# Patient Record
Sex: Male | Born: 1940 | Race: White | Hispanic: No | Marital: Married | Smoking: Former smoker
Health system: Southern US, Community
[De-identification: ages and names within clinical notes are randomized; demographics above are authoritative.]

## PROBLEM LIST (undated history)

## (undated) DIAGNOSIS — I4891 Unspecified atrial fibrillation: Secondary | ICD-10-CM

## (undated) DIAGNOSIS — E119 Type 2 diabetes mellitus without complications: Secondary | ICD-10-CM

## (undated) DIAGNOSIS — I2510 Atherosclerotic heart disease of native coronary artery without angina pectoris: Secondary | ICD-10-CM

## (undated) DIAGNOSIS — J449 Chronic obstructive pulmonary disease, unspecified: Secondary | ICD-10-CM

## (undated) DIAGNOSIS — IMO0002 Reserved for concepts with insufficient information to code with codable children: Secondary | ICD-10-CM

## (undated) DIAGNOSIS — N189 Chronic kidney disease, unspecified: Secondary | ICD-10-CM

## (undated) DIAGNOSIS — Z9989 Dependence on other enabling machines and devices: Principal | ICD-10-CM

## (undated) DIAGNOSIS — I509 Heart failure, unspecified: Secondary | ICD-10-CM

## (undated) DIAGNOSIS — C4491 Basal cell carcinoma of skin, unspecified: Secondary | ICD-10-CM

## (undated) DIAGNOSIS — I10 Essential (primary) hypertension: Secondary | ICD-10-CM

## (undated) DIAGNOSIS — G4733 Obstructive sleep apnea (adult) (pediatric): Secondary | ICD-10-CM

## (undated) HISTORY — DX: Heart failure, unspecified: I50.9

## (undated) HISTORY — DX: Type 2 diabetes mellitus without complications: E11.9

## (undated) HISTORY — DX: Obstructive sleep apnea (adult) (pediatric): G47.33

## (undated) HISTORY — PX: HERNIA REPAIR: SHX51

## (undated) HISTORY — DX: Atherosclerotic heart disease of native coronary artery without angina pectoris: I25.10

## (undated) HISTORY — DX: Reserved for concepts with insufficient information to code with codable children: IMO0002

## (undated) HISTORY — DX: Unspecified atrial fibrillation: I48.91

## (undated) HISTORY — PX: OTHER SURGICAL HISTORY: SHX169

## (undated) HISTORY — DX: Basal cell carcinoma of skin, unspecified: C44.91

## (undated) HISTORY — DX: Essential (primary) hypertension: I10

## (undated) HISTORY — DX: Chronic obstructive pulmonary disease, unspecified: J44.9

## (undated) HISTORY — PX: CLAVICLE SURGERY: SHX598

## (undated) HISTORY — DX: Dependence on other enabling machines and devices: Z99.89

---

## 2008-01-24 ENCOUNTER — Ambulatory Visit: Payer: Self-pay | Admitting: Gastroenterology

## 2008-07-27 ENCOUNTER — Inpatient Hospital Stay: Payer: Self-pay | Admitting: Internal Medicine

## 2012-01-19 ENCOUNTER — Ambulatory Visit: Payer: Self-pay | Admitting: Gastroenterology

## 2012-03-13 ENCOUNTER — Ambulatory Visit: Payer: Self-pay | Admitting: Ophthalmology

## 2012-03-25 ENCOUNTER — Ambulatory Visit: Payer: Self-pay | Admitting: Ophthalmology

## 2012-04-23 ENCOUNTER — Ambulatory Visit: Payer: Self-pay | Admitting: Gastroenterology

## 2012-04-24 LAB — PATHOLOGY REPORT

## 2012-06-03 ENCOUNTER — Ambulatory Visit: Payer: Self-pay | Admitting: Ophthalmology

## 2012-06-10 ENCOUNTER — Ambulatory Visit: Payer: Self-pay | Admitting: Ophthalmology

## 2012-07-09 ENCOUNTER — Ambulatory Visit: Payer: Self-pay | Admitting: Gastroenterology

## 2012-08-15 ENCOUNTER — Other Ambulatory Visit: Payer: Self-pay | Admitting: Gastroenterology

## 2012-08-21 ENCOUNTER — Ambulatory Visit: Payer: Self-pay | Admitting: Internal Medicine

## 2012-08-21 LAB — CBC CANCER CENTER
Basophil #: 0.1 x10 3/mm (ref 0.0–0.1)
Basophil %: 1 %
Eosinophil #: 0.5 x10 3/mm (ref 0.0–0.7)
HCT: 40.6 % (ref 40.0–52.0)
Lymphocyte #: 2.1 x10 3/mm (ref 1.0–3.6)
Lymphocyte %: 28.1 %
MCH: 28.3 pg (ref 26.0–34.0)
Monocyte #: 0.8 x10 3/mm (ref 0.2–1.0)
Neutrophil %: 54.3 %
Platelet: 191 x10 3/mm (ref 150–440)
RBC: 4.69 10*6/uL (ref 4.40–5.90)
RDW: 14.6 % — ABNORMAL HIGH (ref 11.5–14.5)

## 2012-08-22 LAB — PROT IMMUNOELECTROPHORES(ARMC)

## 2012-09-19 ENCOUNTER — Ambulatory Visit: Payer: Self-pay | Admitting: Internal Medicine

## 2012-09-26 LAB — CBC CANCER CENTER
Basophil #: 0.1 x10 3/mm (ref 0.0–0.1)
Eosinophil #: 0.4 x10 3/mm (ref 0.0–0.7)
HGB: 13.1 g/dL (ref 13.0–18.0)
Lymphocyte #: 1.5 x10 3/mm (ref 1.0–3.6)
Lymphocyte %: 20.3 %
MCH: 28.1 pg (ref 26.0–34.0)
MCHC: 32.9 g/dL (ref 32.0–36.0)
MCV: 85 fL (ref 80–100)
Neutrophil #: 4.6 x10 3/mm (ref 1.4–6.5)
Neutrophil %: 63.3 %
Platelet: 211 x10 3/mm (ref 150–440)
RDW: 14.3 % (ref 11.5–14.5)
WBC: 7.3 x10 3/mm (ref 3.8–10.6)

## 2012-10-20 ENCOUNTER — Ambulatory Visit: Payer: Self-pay | Admitting: Internal Medicine

## 2012-11-01 ENCOUNTER — Ambulatory Visit: Payer: Self-pay | Admitting: Internal Medicine

## 2013-01-09 ENCOUNTER — Ambulatory Visit: Payer: Self-pay | Admitting: Internal Medicine

## 2013-02-13 ENCOUNTER — Ambulatory Visit: Payer: Self-pay | Admitting: Internal Medicine

## 2013-02-13 LAB — CBC CANCER CENTER
Basophil #: 0.1 x10 3/mm (ref 0.0–0.1)
Basophil %: 0.8 %
Eosinophil %: 5.6 %
HGB: 13.2 g/dL (ref 13.0–18.0)
Lymphocyte %: 19.7 %
MCH: 28.4 pg (ref 26.0–34.0)
MCHC: 33.6 g/dL (ref 32.0–36.0)
Monocyte #: 1 x10 3/mm (ref 0.2–1.0)
Monocyte %: 10 %
Neutrophil %: 63.9 %
RBC: 4.63 10*6/uL (ref 4.40–5.90)
RDW: 15 % — ABNORMAL HIGH (ref 11.5–14.5)
WBC: 10.1 x10 3/mm (ref 3.8–10.6)

## 2013-02-13 LAB — IRON AND TIBC
Iron Saturation: 17 %
Iron: 69 ug/dL (ref 65–175)
Unbound Iron-Bind.Cap.: 342 ug/dL

## 2013-02-14 LAB — KAPPA/LAMBDA FREE LIGHT CHAINS (ARMC)

## 2013-02-19 ENCOUNTER — Ambulatory Visit: Payer: Self-pay | Admitting: Internal Medicine

## 2013-03-03 ENCOUNTER — Ambulatory Visit: Payer: Self-pay | Admitting: Gastroenterology

## 2013-03-22 ENCOUNTER — Ambulatory Visit: Payer: Self-pay | Admitting: Internal Medicine

## 2013-05-08 ENCOUNTER — Ambulatory Visit: Payer: Self-pay | Admitting: Internal Medicine

## 2013-05-08 LAB — CREATININE, SERUM
Creatinine: 1.44 mg/dL — ABNORMAL HIGH (ref 0.60–1.30)
EGFR (African American): 57 — ABNORMAL LOW
EGFR (Non-African Amer.): 49 — ABNORMAL LOW

## 2013-05-08 LAB — CBC CANCER CENTER
Basophil #: 0.1 x10 3/mm (ref 0.0–0.1)
Basophil %: 0.8 %
Eosinophil #: 0.4 x10 3/mm (ref 0.0–0.7)
Eosinophil %: 6 %
HCT: 41.7 % (ref 40.0–52.0)
Lymphocyte %: 25.9 %
MCHC: 32.1 g/dL (ref 32.0–36.0)
MCV: 85 fL (ref 80–100)
Monocyte %: 10.3 %
Neutrophil %: 57 %
Platelet: 200 x10 3/mm (ref 150–440)

## 2013-05-12 LAB — KAPPA/LAMBDA FREE LIGHT CHAINS (ARMC)

## 2013-05-12 LAB — PROT IMMUNOELECTROPHORES(ARMC)

## 2013-05-22 ENCOUNTER — Ambulatory Visit: Payer: Self-pay | Admitting: Internal Medicine

## 2013-08-06 ENCOUNTER — Ambulatory Visit: Payer: Self-pay | Admitting: Internal Medicine

## 2013-08-07 LAB — CBC CANCER CENTER
Basophil #: 0.1 x10 3/mm (ref 0.0–0.1)
Basophil %: 0.9 %
Eosinophil #: 0.5 x10 3/mm (ref 0.0–0.7)
Eosinophil %: 7 %
HCT: 42.5 % (ref 40.0–52.0)
HGB: 13.6 g/dL (ref 13.0–18.0)
LYMPHS ABS: 1.6 x10 3/mm (ref 1.0–3.6)
Lymphocyte %: 22.2 %
MCH: 27.5 pg (ref 26.0–34.0)
MCHC: 32 g/dL (ref 32.0–36.0)
MCV: 86 fL (ref 80–100)
MONOS PCT: 8.8 %
Monocyte #: 0.6 x10 3/mm (ref 0.2–1.0)
Neutrophil #: 4.5 x10 3/mm (ref 1.4–6.5)
Neutrophil %: 61.1 %
Platelet: 205 x10 3/mm (ref 150–440)
RBC: 4.96 10*6/uL (ref 4.40–5.90)
RDW: 15.4 % — ABNORMAL HIGH (ref 11.5–14.5)
WBC: 7.3 x10 3/mm (ref 3.8–10.6)

## 2013-08-11 LAB — KAPPA/LAMBDA FREE LIGHT CHAINS (ARMC)

## 2013-08-11 LAB — PROT IMMUNOELECTROPHORES(ARMC)

## 2013-08-20 ENCOUNTER — Ambulatory Visit: Payer: Self-pay | Admitting: Internal Medicine

## 2013-10-04 LAB — TROPONIN I: Troponin-I: <0.02 ng/mL

## 2013-10-04 LAB — CBC
HCT: 37.8 % — ABNORMAL LOW (ref 40.0–52.0)
HGB: 12.1 g/dL — AB (ref 13.0–18.0)
MCH: 27.9 pg (ref 26.0–34.0)
MCHC: 32.1 g/dL (ref 32.0–36.0)
MCV: 87 fL (ref 80–100)
Platelet: 228 10*3/uL (ref 150–440)
RBC: 4.36 10*6/uL — AB (ref 4.40–5.90)
RDW: 14.4 % (ref 11.5–14.5)
WBC: 7.5 10*3/uL (ref 3.8–10.6)

## 2013-10-04 LAB — BASIC METABOLIC PANEL
Anion Gap: 5 — ABNORMAL LOW (ref 7–16)
BUN: 29 mg/dL — ABNORMAL HIGH (ref 7–18)
CO2: 29 mmol/L (ref 21–32)
Calcium, Total: 9 mg/dL (ref 8.5–10.1)
Chloride: 107 mmol/L (ref 98–107)
Creatinine: 1.6 mg/dL — ABNORMAL HIGH (ref 0.60–1.30)
GFR CALC AF AMER: 50 — AB
GFR CALC NON AF AMER: 43 — AB
Glucose: 65 mg/dL (ref 65–99)
Osmolality: 285 (ref 275–301)
Potassium: 4.1 mmol/L (ref 3.5–5.1)
Sodium: 141 mmol/L (ref 136–145)

## 2013-10-04 LAB — PRO B NATRIURETIC PEPTIDE: B-Type Natriuretic Peptide: 896 pg/mL — ABNORMAL HIGH (ref 0–125)

## 2013-10-05 ENCOUNTER — Observation Stay: Payer: Self-pay | Admitting: Internal Medicine

## 2013-10-05 LAB — CK-MB
CK-MB: 3.1 ng/mL (ref 0.5–3.6)
CK-MB: 2.5 ng/mL (ref 0.5–3.6)
CK-MB: 2.3 ng/mL (ref 0.5–3.6)

## 2013-10-05 LAB — TROPONIN I

## 2013-10-05 LAB — CREATININE, SERUM
Creatinine: 1.46 mg/dL — ABNORMAL HIGH (ref 0.60–1.30)
EGFR (African American): 55 — ABNORMAL LOW
EGFR (Non-African Amer.): 48 — ABNORMAL LOW

## 2013-10-22 ENCOUNTER — Encounter: Payer: Self-pay | Admitting: Internal Medicine

## 2013-10-22 ENCOUNTER — Ambulatory Visit: Payer: Self-pay | Admitting: Internal Medicine

## 2013-11-13 ENCOUNTER — Ambulatory Visit: Payer: Self-pay | Admitting: Internal Medicine

## 2013-11-13 LAB — CBC CANCER CENTER
BASOS PCT: 1.1 %
Basophil #: 0.1 x10 3/mm (ref 0.0–0.1)
EOS ABS: 0.6 x10 3/mm (ref 0.0–0.7)
EOS PCT: 6.6 %
HCT: 35 % — AB (ref 40.0–52.0)
HGB: 11.4 g/dL — ABNORMAL LOW (ref 13.0–18.0)
Lymphocyte #: 1.9 x10 3/mm (ref 1.0–3.6)
Lymphocyte %: 20.1 %
MCH: 27 pg (ref 26.0–34.0)
MCHC: 32.6 g/dL (ref 32.0–36.0)
MCV: 83 fL (ref 80–100)
Monocyte #: 0.8 x10 3/mm (ref 0.2–1.0)
Monocyte %: 8.7 %
NEUTROS ABS: 6 x10 3/mm (ref 1.4–6.5)
Neutrophil %: 63.5 %
PLATELETS: 215 x10 3/mm (ref 150–440)
RBC: 4.22 10*6/uL — ABNORMAL LOW (ref 4.40–5.90)
RDW: 14.9 % — AB (ref 11.5–14.5)
WBC: 9.5 x10 3/mm (ref 3.8–10.6)

## 2013-11-14 LAB — KAPPA/LAMBDA FREE LIGHT CHAINS (ARMC)

## 2013-11-19 ENCOUNTER — Ambulatory Visit: Payer: Self-pay | Admitting: Internal Medicine

## 2014-06-19 ENCOUNTER — Inpatient Hospital Stay: Payer: Self-pay | Admitting: Specialist

## 2014-06-19 LAB — BASIC METABOLIC PANEL
Anion Gap: 5 — ABNORMAL LOW (ref 7–16)
BUN: 25 mg/dL — ABNORMAL HIGH (ref 7–18)
CALCIUM: 8.5 mg/dL (ref 8.5–10.1)
CREATININE: 1.53 mg/dL — AB (ref 0.60–1.30)
Chloride: 102 mmol/L (ref 98–107)
Co2: 29 mmol/L (ref 21–32)
GFR CALC AF AMER: 58 — AB
GFR CALC NON AF AMER: 48 — AB
GLUCOSE: 223 mg/dL — AB (ref 65–99)
Osmolality: 283 (ref 275–301)
Potassium: 4.2 mmol/L (ref 3.5–5.1)
Sodium: 136 mmol/L (ref 136–145)

## 2014-06-19 LAB — CBC WITH DIFFERENTIAL/PLATELET
BASOS PCT: 0.7 %
Basophil #: 0 10*3/uL (ref 0.0–0.1)
EOS ABS: 0.3 10*3/uL (ref 0.0–0.7)
Eosinophil %: 4.2 %
HCT: 39 % — AB (ref 40.0–52.0)
HGB: 12.5 g/dL — ABNORMAL LOW (ref 13.0–18.0)
LYMPHS PCT: 16.9 %
Lymphocyte #: 1.1 10*3/uL (ref 1.0–3.6)
MCH: 26.6 pg (ref 26.0–34.0)
MCHC: 32 g/dL (ref 32.0–36.0)
MCV: 83 fL (ref 80–100)
MONO ABS: 0.8 x10 3/mm (ref 0.2–1.0)
Monocyte %: 13.3 %
NEUTROS ABS: 4.1 10*3/uL (ref 1.4–6.5)
Neutrophil %: 64.9 %
Platelet: 192 10*3/uL (ref 150–440)
RBC: 4.7 10*6/uL (ref 4.40–5.90)
RDW: 17.5 % — ABNORMAL HIGH (ref 11.5–14.5)
WBC: 6.3 10*3/uL (ref 3.8–10.6)

## 2014-06-19 LAB — INFLUENZA A,B,H1N1 - PCR (ARMC)
H1N1FLUPCR: NOT DETECTED
INFLAPCR: NEGATIVE
Influenza B By PCR: NEGATIVE

## 2014-06-19 LAB — TROPONIN I
TROPONIN-I: 0.06 ng/mL — AB
Troponin-I: 0.11 ng/mL — ABNORMAL HIGH
Troponin-I: 0.07 ng/mL — ABNORMAL HIGH

## 2014-06-20 LAB — CBC WITH DIFFERENTIAL/PLATELET
BASOS ABS: 0 10*3/uL (ref 0.0–0.1)
Basophil %: 0.3 %
EOS ABS: 0 10*3/uL (ref 0.0–0.7)
Eosinophil %: 0 %
HCT: 37.1 % — ABNORMAL LOW (ref 40.0–52.0)
HGB: 11.5 g/dL — ABNORMAL LOW (ref 13.0–18.0)
Lymphocyte #: 0.7 10*3/uL — ABNORMAL LOW (ref 1.0–3.6)
Lymphocyte %: 8.9 %
MCH: 25.8 pg — ABNORMAL LOW (ref 26.0–34.0)
MCHC: 31.1 g/dL — AB (ref 32.0–36.0)
MCV: 83 fL (ref 80–100)
MONO ABS: 0.3 x10 3/mm (ref 0.2–1.0)
Monocyte %: 3.6 %
NEUTROS PCT: 87.2 %
Neutrophil #: 6.6 10*3/uL — ABNORMAL HIGH (ref 1.4–6.5)
PLATELETS: 185 10*3/uL (ref 150–440)
RBC: 4.47 10*6/uL (ref 4.40–5.90)
RDW: 17.4 % — ABNORMAL HIGH (ref 11.5–14.5)
WBC: 7.6 10*3/uL (ref 3.8–10.6)

## 2014-06-20 LAB — BASIC METABOLIC PANEL
ANION GAP: 7 (ref 7–16)
BUN: 35 mg/dL — ABNORMAL HIGH (ref 7–18)
CALCIUM: 8.6 mg/dL (ref 8.5–10.1)
Chloride: 102 mmol/L (ref 98–107)
Co2: 29 mmol/L (ref 21–32)
Creatinine: 1.86 mg/dL — ABNORMAL HIGH (ref 0.60–1.30)
GFR CALC AF AMER: 46 — AB
GFR CALC NON AF AMER: 38 — AB
GLUCOSE: 350 mg/dL — AB (ref 65–99)
Osmolality: 298 (ref 275–301)
Potassium: 4.5 mmol/L (ref 3.5–5.1)
Sodium: 138 mmol/L (ref 136–145)

## 2014-06-20 LAB — LIPID PANEL
Cholesterol: 160 mg/dL (ref 0–200)
HDL Cholesterol: 28 mg/dL — ABNORMAL LOW (ref 40–60)
LDL CHOLESTEROL, CALC: 70 mg/dL (ref 0–100)
TRIGLYCERIDES: 311 mg/dL — AB (ref 0–200)
VLDL Cholesterol, Calc: 62 mg/dL — ABNORMAL HIGH (ref 5–40)

## 2014-06-22 ENCOUNTER — Inpatient Hospital Stay: Payer: Self-pay | Admitting: Internal Medicine

## 2014-06-22 LAB — CBC
HCT: 37.7 % — ABNORMAL LOW (ref 40.0–52.0)
HGB: 11.8 g/dL — ABNORMAL LOW (ref 13.0–18.0)
MCH: 25.8 pg — AB (ref 26.0–34.0)
MCHC: 31.2 g/dL — ABNORMAL LOW (ref 32.0–36.0)
MCV: 83 fL (ref 80–100)
Platelet: 202 10*3/uL (ref 150–440)
RBC: 4.55 10*6/uL (ref 4.40–5.90)
RDW: 17.3 % — AB (ref 11.5–14.5)
WBC: 8.7 10*3/uL (ref 3.8–10.6)

## 2014-06-22 LAB — COMPREHENSIVE METABOLIC PANEL
ALBUMIN: 3.3 g/dL — AB (ref 3.4–5.0)
ALK PHOS: 83 U/L (ref 46–116)
Anion Gap: 7 (ref 7–16)
BILIRUBIN TOTAL: 0.4 mg/dL (ref 0.2–1.0)
BUN: 37 mg/dL — ABNORMAL HIGH (ref 7–18)
CALCIUM: 8.9 mg/dL (ref 8.5–10.1)
CHLORIDE: 102 mmol/L (ref 98–107)
CREATININE: 1.6 mg/dL — AB (ref 0.60–1.30)
Co2: 29 mmol/L (ref 21–32)
EGFR (African American): 55 — ABNORMAL LOW
GFR CALC NON AF AMER: 45 — AB
Glucose: 246 mg/dL — ABNORMAL HIGH (ref 65–99)
Osmolality: 293 (ref 275–301)
POTASSIUM: 4.1 mmol/L (ref 3.5–5.1)
SGOT(AST): 20 U/L (ref 15–37)
SGPT (ALT): 23 U/L (ref 14–63)
Sodium: 138 mmol/L (ref 136–145)
Total Protein: 7 g/dL (ref 6.4–8.2)

## 2014-06-22 LAB — TROPONIN I: TROPONIN-I: 0.09 ng/mL — AB

## 2014-06-22 LAB — PRO B NATRIURETIC PEPTIDE: B-TYPE NATIURETIC PEPTID: 2880 pg/mL — AB (ref 0–125)

## 2014-06-22 LAB — MAGNESIUM: Magnesium: 2.1 mg/dL

## 2014-06-23 LAB — BASIC METABOLIC PANEL
Anion Gap: 7 (ref 7–16)
BUN: 38 mg/dL — AB (ref 7–18)
Calcium, Total: 8.8 mg/dL (ref 8.5–10.1)
Chloride: 100 mmol/L (ref 98–107)
Co2: 32 mmol/L (ref 21–32)
Creatinine: 1.63 mg/dL — ABNORMAL HIGH (ref 0.60–1.30)
EGFR (African American): 54 — ABNORMAL LOW
EGFR (Non-African Amer.): 44 — ABNORMAL LOW
GLUCOSE: 225 mg/dL — AB (ref 65–99)
OSMOLALITY: 294 (ref 275–301)
POTASSIUM: 4.4 mmol/L (ref 3.5–5.1)
SODIUM: 139 mmol/L (ref 136–145)

## 2014-06-23 LAB — CBC WITH DIFFERENTIAL/PLATELET
Basophil #: 0 10*3/uL (ref 0.0–0.1)
Basophil %: 0.3 %
Eosinophil #: 0 10*3/uL (ref 0.0–0.7)
Eosinophil %: 0 %
HCT: 36.6 % — ABNORMAL LOW (ref 40.0–52.0)
HGB: 11.6 g/dL — ABNORMAL LOW (ref 13.0–18.0)
Lymphocyte #: 0.5 10*3/uL — ABNORMAL LOW (ref 1.0–3.6)
Lymphocyte %: 10 %
MCH: 26.1 pg (ref 26.0–34.0)
MCHC: 31.6 g/dL — ABNORMAL LOW (ref 32.0–36.0)
MCV: 83 fL (ref 80–100)
Monocyte #: 0.6 x10 3/mm (ref 0.2–1.0)
Monocyte %: 10.4 %
Neutrophil #: 4.3 10*3/uL (ref 1.4–6.5)
Neutrophil %: 79.3 %
Platelet: 185 10*3/uL (ref 150–440)
RBC: 4.43 10*6/uL (ref 4.40–5.90)
RDW: 17.6 % — ABNORMAL HIGH (ref 11.5–14.5)
WBC: 5.5 10*3/uL (ref 3.8–10.6)

## 2014-06-23 LAB — MAGNESIUM: Magnesium: 2.1 mg/dL

## 2014-06-23 LAB — HEMOGLOBIN A1C: HEMOGLOBIN A1C: 10.8 % — AB (ref 4.2–6.3)

## 2014-06-24 LAB — CBC WITH DIFFERENTIAL/PLATELET
BASOS ABS: 0 10*3/uL (ref 0.0–0.1)
Basophil %: 0.3 %
Eosinophil #: 0 10*3/uL (ref 0.0–0.7)
Eosinophil %: 0.1 %
HCT: 34.9 % — ABNORMAL LOW (ref 40.0–52.0)
HGB: 11.1 g/dL — ABNORMAL LOW (ref 13.0–18.0)
Lymphocyte #: 0.9 10*3/uL — ABNORMAL LOW (ref 1.0–3.6)
Lymphocyte %: 15.9 %
MCH: 26.1 pg (ref 26.0–34.0)
MCHC: 31.9 g/dL — ABNORMAL LOW (ref 32.0–36.0)
MCV: 82 fL (ref 80–100)
MONOS PCT: 16 %
Monocyte #: 0.9 x10 3/mm (ref 0.2–1.0)
NEUTROS ABS: 3.9 10*3/uL (ref 1.4–6.5)
Neutrophil %: 67.7 %
PLATELETS: 197 10*3/uL (ref 150–440)
RBC: 4.27 10*6/uL — ABNORMAL LOW (ref 4.40–5.90)
RDW: 17.1 % — AB (ref 11.5–14.5)
WBC: 5.7 10*3/uL (ref 3.8–10.6)

## 2014-06-24 LAB — BASIC METABOLIC PANEL
Anion Gap: 5 — ABNORMAL LOW (ref 7–16)
BUN: 44 mg/dL — ABNORMAL HIGH (ref 7–18)
CALCIUM: 8.8 mg/dL (ref 8.5–10.1)
CO2: 35 mmol/L — AB (ref 21–32)
Chloride: 99 mmol/L (ref 98–107)
Creatinine: 1.75 mg/dL — ABNORMAL HIGH (ref 0.60–1.30)
EGFR (African American): 50 — ABNORMAL LOW
EGFR (Non-African Amer.): 41 — ABNORMAL LOW
Glucose: 288 mg/dL — ABNORMAL HIGH (ref 65–99)
Osmolality: 299 (ref 275–301)
Potassium: 4.3 mmol/L (ref 3.5–5.1)
Sodium: 139 mmol/L (ref 136–145)

## 2014-06-25 LAB — BASIC METABOLIC PANEL
ANION GAP: 4 — AB (ref 7–16)
BUN: 40 mg/dL — ABNORMAL HIGH (ref 7–18)
CHLORIDE: 97 mmol/L — AB (ref 98–107)
Calcium, Total: 8.9 mg/dL (ref 8.5–10.1)
Co2: 36 mmol/L — ABNORMAL HIGH (ref 21–32)
Creatinine: 1.62 mg/dL — ABNORMAL HIGH (ref 0.60–1.30)
EGFR (African American): 54 — ABNORMAL LOW
EGFR (Non-African Amer.): 45 — ABNORMAL LOW
Glucose: 344 mg/dL — ABNORMAL HIGH (ref 65–99)
Osmolality: 297 (ref 275–301)
Potassium: 4.2 mmol/L (ref 3.5–5.1)
SODIUM: 137 mmol/L (ref 136–145)

## 2014-06-27 LAB — CULTURE, BLOOD (SINGLE)

## 2014-07-06 ENCOUNTER — Ambulatory Visit: Payer: Self-pay | Admitting: Internal Medicine

## 2014-07-07 ENCOUNTER — Inpatient Hospital Stay: Payer: Self-pay | Admitting: Internal Medicine

## 2014-07-13 ENCOUNTER — Inpatient Hospital Stay: Payer: Self-pay | Admitting: Internal Medicine

## 2014-07-23 ENCOUNTER — Other Ambulatory Visit: Payer: Self-pay | Admitting: Internal Medicine

## 2014-07-23 DIAGNOSIS — J449 Chronic obstructive pulmonary disease, unspecified: Secondary | ICD-10-CM

## 2014-07-28 ENCOUNTER — Ambulatory Visit: Payer: Self-pay

## 2014-07-28 ENCOUNTER — Inpatient Hospital Stay: Payer: Self-pay | Admitting: Internal Medicine

## 2014-08-17 ENCOUNTER — Encounter: Payer: Self-pay | Admitting: Internal Medicine

## 2014-08-17 ENCOUNTER — Ambulatory Visit: Payer: Self-pay

## 2014-08-17 ENCOUNTER — Ambulatory Visit (INDEPENDENT_AMBULATORY_CARE_PROVIDER_SITE_OTHER): Payer: Medicare Other | Admitting: Internal Medicine

## 2014-08-17 ENCOUNTER — Inpatient Hospital Stay: Payer: Self-pay | Admitting: Internal Medicine

## 2014-08-17 VITALS — BP 142/78 | HR 66 | Ht 72.0 in | Wt 216.0 lb

## 2014-08-17 DIAGNOSIS — J441 Chronic obstructive pulmonary disease with (acute) exacerbation: Secondary | ICD-10-CM | POA: Diagnosis not present

## 2014-08-17 DIAGNOSIS — J189 Pneumonia, unspecified organism: Secondary | ICD-10-CM | POA: Diagnosis not present

## 2014-08-17 DIAGNOSIS — J449 Chronic obstructive pulmonary disease, unspecified: Secondary | ICD-10-CM | POA: Insufficient documentation

## 2014-08-17 DIAGNOSIS — Z9989 Dependence on other enabling machines and devices: Principal | ICD-10-CM

## 2014-08-17 DIAGNOSIS — G4733 Obstructive sleep apnea (adult) (pediatric): Secondary | ICD-10-CM

## 2014-08-17 DIAGNOSIS — J439 Emphysema, unspecified: Secondary | ICD-10-CM | POA: Diagnosis not present

## 2014-08-17 LAB — PULMONARY FUNCTION TEST
DL/VA % pred: 88 %
DL/VA: 4.16 ml/min/mmHg/L
DLCO unc % pred: 69 %
DLCO unc: 24.5 ml/min/mmHg
FEF 25-75 POST: 1.74 L/s
FEF 25-75 Pre: 2.16 L/sec
FEF2575-%CHANGE-POST: -19 %
FEF2575-%PRED-POST: 68 %
FEF2575-%Pred-Pre: 84 %
FEV1-%Change-Post: -5 %
FEV1-%PRED-POST: 66 %
FEV1-%PRED-PRE: 69 %
FEV1-PRE: 2.39 L
FEV1-Post: 2.27 L
FEV1FVC-%CHANGE-POST: -2 %
FEV1FVC-%PRED-PRE: 107 %
FEV6-%CHANGE-POST: -2 %
FEV6-%Pred-Post: 67 %
FEV6-%Pred-Pre: 68 %
FEV6-Post: 2.98 L
FEV6-Pre: 3.05 L
FEV6FVC-%PRED-POST: 106 %
FEV6FVC-%Pred-Pre: 106 %
FVC-%CHANGE-POST: -2 %
FVC-%PRED-POST: 63 %
FVC-%Pred-Pre: 65 %
FVC-Post: 2.98 L
FVC-Pre: 3.05 L
POST FEV1/FVC RATIO: 76 %
PRE FEV1/FVC RATIO: 79 %
PRE FEV6/FVC RATIO: 100 %
Post FEV6/FVC ratio: 100 %
RV % PRED: 116 %
RV: 3.03 L
TLC % PRED: 84 %
TLC: 6.25 L

## 2014-08-17 NOTE — Progress Notes (Signed)
PFT performed today. 

## 2014-08-17 NOTE — Progress Notes (Signed)
Date: 08/17/2014  MRN# [id number] Ernest Mclean 26-Feb-1941  Referring Physician: [place] for followup  Ernest Mclean is a 74 y.o. old male seen in consultation for hospital followup  CC:  Chief Complaint  Patient presents with  . Hospitalization Follow-up    Pt had smw/pft today. Pt was d/c from hospital on 07/17/14. Pt feeling much better.    HPI:  74 year old man who was recently discharged from [facility] after admission for respiratory failure attributed to pneumonia, chronic obstructive pulmonary disease, diastolic heart failure seen for hospital followup today. He states that initially after his discharge on 02/05, he felt well until approximately 07/07/2014.  At that time, for reasons unknown, he began feeling extremely weak, and noting that his blood pressure was low particularly after taking his morning medications.  His wife carries with her a log of blood pressures and blood sugars and on 07/07/2014 notes a blood pressure 80s/60s.  His heart rate has also progressively increased over this time.  He has had multiple readings as low as that over the past few days with readings only as high as 120/80. Four days prior he developed diarrhea, having 3 to 4 very large watery bowel movements per day. He started vomiting last night and vomited throughout the night. No hematemesis, melena, or hematochezia. His wife has been trying to encourage fluid intake and states that he has actually gained 4 pounds over the past few days.  At the time of presentation to the Emergency Room, he was in afib with RVR at 130-140, hypotensive, with blood pressures in the 90s over 70s and very weak.  Blood sugars also elevated at 425.  Currently, he states that he is doing well.  He has a history of OSA, on CPAP with 2L O2 bleed in.  Over the past 1 month he has had 3 admission to [facility] for various reason (sob, weakness, pneumonia, bronchitis).  He has a smoking history of 1-2ppdx15 years (age [age]).  Further history reveals that 3-4  months ago he was in his usual states of health with no significant dyspnea, cough, sputum production or requiring inhalers.  No significant cough, worsening sob, or sputum production today. He is accompanied by his wife today.  He states that overall he is doing well, his oxygen saturation on room air is greater than 88%. Patient is a history of diabetes, and claudication of the lower extremities, wife and patient has stated that since starting supplemental oxygen his claudication has improved. However, one 6 minute walk test today and at rest on room air he did not desaturate less than 88%.   [facility] DATE OF ADMISSION:  07/13/2014 DATE OF DISCHARGE:  07/17/2014  ADMITTING PHYSICIAN: Barnetta Chapel P. Volanda Napoleon, MD  DISCHARGING PHYSICIAN: Gladstone Lighter, MD   PRIMARY CARE PHYSICIAN: Youlanda Roys. Lovie Macadamia, [place]:  1.  Cardiology consultation by Dr. Nehemiah Massed.  2.  Endocrinology consultation by Dr. Lavone Orn.  3.  Pulmonary critical care consultation by Dr. Vilinda Boehringer.   DISCHARGE DIAGNOSES:  1.  Hypotension from hypertensive medications.  2.  Congestive heart failure exacerbation with diastolic dysfunction.  3.  Brittle diabetes mellitus.  4.  Obstructive sleep apnea on CPAP.  5.  Chronic obstructive pulmonary disease on 2 L home oxygen.  6.  Hypertension.  7.  Atrial fibrillation, new onset.  8.  Chronic kidney disease.  9.  Acute renal failure on presentation.  10.  Coronary artery disease status post bypass graft surgery.  BRIEF HOSPITAL COURSE: Mr. Scot Mclean is a 74 year old Caucasian male with multiple medical problems including coronary artery disease status post bypass graft surgery, history of diastolic CHF, COPD on 2 L home oxygen, obstructive sleep apnea, hypertension, diabetes, anxiety, depression, who was recently admitted to the hospital 2 weeks ago prior to this admission for pneumonia and on admission prior to that for COPD. He  was discharged back home and presents with weakness and noted to have hypotension.  1.  Hypotension. The patient does have a history of labile blood pressure and his wife very strictly monitors his fluid intake and also his sodium intake. His blood pressure medications were recently adjusted as an outpatient. It seems like Imdur was increased and wife feels like his blood pressure dropped to the result of that. In the hospital, after he was admitted, after her some IV fluids and holding his blood pressure medications, his blood pressure has improved. He was on metoprolol all the while, Norvasc was restricted. His lisinopril HCTZ and Imdur were still on hold. Norvasc is at half dose. His blood pressure has been actually in 053-976 range systolic at the time of discharge. I advised wife to increase the Norvasc by 2.5 mg if the blood pressure stays like this at home. She does periodically monitor blood pressure and he also has a home health nurse coming for that. The patient's wife wants to keep his blood pressure high for now and make sure it does not drop. She is very, very involved in his care. So, we will give another prescription for 2.5 mg of Norvasc to be used as needed, but his blood pressure has been stable in the hospital.  2.  Brittle diabetes mellitus. The patient also was having trouble with his sugars going as high as 425 at home and dropping in the hospital to as low as in his 26s. His A1c is not that elevated. His HbA1c recently has been 10.8. Seen by endocrinologist, Dr. Gabriel Carina. Medications were adjusted. He is Humalog t.i.d. with meals 15 units and Lantus 50 units at bedtime and also sliding scale insulin and she will follow up with him as an outpatient next week. His sugars are better, improved after the Lantus from 100-250 range at this time. He clinically feels well.  3.  Acute CHF exacerbation, diastolic dysfunction that happened in the hospital likely from all the fluids he received when he  came in for hypotension. Seen by Dr. Nehemiah Massed. Being discharged on low dose Lasix.  4.  COPD and sleep apnea. Continue to use CPAP and 2 L home oxygen. Seen by Dr. Stevenson Clinch per COPD GOLD protocol. He is supposed to see Dr. Stevenson Clinch anyway as an outpatient. CT chest showed actually resolving of his infiltrate from previous admission, so initially antibiotics were started empirically, but no evidence of clinical pneumonia, so antibiotics are discontinued at the time of discharge. He will continue to take his inhalers and use oxygen and do outpatient followup.  5.  History of CAD status post CABG. Continue his cardiac medication. Seen by Dr. Nehemiah Massed in the hospital.  6.  Chronic renal failure. The patient not seeing a nephrologist but with his CHF, Lasix, advised to follow up with a nephrologist as an outpatient. He had mild renal insufficiency on admission with creatinine greater than 2 that improved up to 1.5 at the time of discharge. His baseline seems to around 1.7 based on previous admissions.  7.  New onset atrial fibrillation. Not symptomatic. Incidentally found. Started  on heparin drip initially, changed over to Eliquis at the time of discharge. His rate is controlled. He is on metoprolol b.i.d. p.r.n.   His course has been otherwise uneventful in the hospital. He was relatively stable prior to discharge.   PMHX:   Past Medical History  Diagnosis Date  . Diabetes   . Hypertension   . COPD (chronic obstructive pulmonary disease)   . Congestive heart failure   . OSA on CPAP   . Atrial fibrillation   . Acute renal failure   . CAD (coronary artery disease)    Surgical Hx:  Past Surgical History  Procedure Laterality Date  . Heart bypass    . Hernia repair    . Clavicle surgery     Family Hx:  History reviewed. No pertinent family history. Social Hx:   History  Substance Use Topics  . Smoking status: Former Smoker -- 1.00 packs/day for 30 years    Types: Cigarettes  . Smokeless  tobacco: Never Used     Comment: quit 12/14/2001  . Alcohol Use: No   Medication:   Current Outpatient Rx  Name  Route  Sig  Dispense  Refill  . amLODipine (NORVASC) 2.5 MG tablet   Oral   Take 2.5 mg by mouth daily.          Marland Kitchen amLODipine (NORVASC) 5 MG tablet   Oral   Take 5 mg by mouth daily.         Marland Kitchen apixaban (ELIQUIS) 5 MG TABS tablet   Oral   Take 5 mg by mouth 2 (two) times daily.         Marland Kitchen aspirin EC 81 MG tablet   Oral   Take 81 mg by mouth daily.         . citalopram (CELEXA) 20 MG tablet   Oral   Take 20 mg by mouth daily.      5   . diazepam (VALIUM) 5 MG tablet   Oral   Take 5 mg by mouth every 12 (twelve) hours as needed.         . Fluticasone-Salmeterol (ADVAIR) 250-50 MCG/DOSE AEPB   Inhalation   Inhale 1 puff into the lungs 2 (two) times daily.         . furosemide (LASIX) 20 MG tablet   Oral   Take 20 mg by mouth daily.         Marland Kitchen HUMALOG 100 UNIT/ML injection   Subconjunctival   30 Units by Subconjunctival route 3 (three) times daily with meals.       5     Dispense as written.   Marland Kitchen LANTUS 100 UNIT/ML injection   Subcutaneous   Inject 95 Units into the skin at bedtime.      5     Dispense as written.   . metoprolol (LOPRESSOR) 100 MG tablet   Oral   Take 100 mg by mouth 2 (two) times daily.      0   . NOVOLOG 100 UNIT/ML injection   Subcutaneous   Inject 15 Units into the skin 3 (three) times daily before meals.      0     Dispense as written.   Marland Kitchen omeprazole (PRILOSEC) 20 MG capsule   Oral   Take 20 mg by mouth daily.      3   . pravastatin (PRAVACHOL) 20 MG tablet   Oral   Take 20 mg by mouth at bedtime.  3   . tiotropium (SPIRIVA) 18 MCG inhalation capsule   Inhalation   Place 18 mcg into inhaler and inhale daily.         . Vitamin D, Ergocalciferol, (DRISDOL) 50000 UNITS CAPS capsule   Oral   Take 1 capsule by mouth once a week.      0       Allergies:  Hydralazine; Penicillins; and  Codeine  Review of Systems: Gen:  Denies  fever, sweats, chills HEENT: Denies blurred vision, double vision, ear pain, eye pain, hearing loss, nose bleeds, sore throat Cvc:  No dizziness, chest pain or heaviness Resp:   Mild shortness of breath Gi: Denies swallowing difficulty, stomach pain, nausea or vomiting, diarrhea, constipation, bowel incontinence Gu:  Denies bladder incontinence, burning urine Ext:   No Joint pain, stiffness or swelling Skin: No skin rash, easy bruising or bleeding or hives Endoc:  No polyuria, polydipsia , polyphagia or weight change Psych: No depression, insomnia or hallucinations  Other:  All other systems negative  Physical Examination:   VS: BP 142/78 mmHg  Pulse 66  Ht 6' (1.829 m)  Wt 216 lb (97.977 kg)  BMI 29.29 kg/m2  SpO2 97%  General Appearance: No distress  Neuro:without focal findings, mental status, speech normal, alert and oriented, cranial nerves 2-12 intact, reflexes normal and symmetric, sensation grossly normal  HEENT: PERRLA, EOM intact, no ptosis, no other lesions noticed; Mallampati 3 Pulmonary: normal breath sounds., diaphragmatic excursion normal.No wheezing, No rales;   Sputum Production:  none CardiovascularNormal S1,S2.  No m/r/g.  Abdominal aorta pulsation normal.    Abdomen: Benign, Soft, non-tender, No masses, hepatosplenomegaly, No lymphadenopathy Renal:  No costovertebral tenderness  GU:  No performed at this time. Endoc: No evident thyromegaly, no signs of acromegaly or Cushing features Skin:   warm, no rashes, no ecchymosis  Extremities: normal, no cyanosis, clubbing, warm with normal capillary refill. Other findings: mild pitting edema   Labs results:   Rad results: (The following images and results were reviewed by Dr. Stevenson Clinch). 07/15/2014 COMPARISON:  Chest CT, 06/22/2014  FINDINGS: Thoracic inlet:  No masses.  No neck base or axillary adenopathy.  Mediastinum: Stable changes from CABG surgery. Moderate  coronary artery calcifications. Heart normal in size. No mediastinal or hilar masses. Borderline enlarged precarinal lymph node is stable from the recent prior exam.  Lungs and pleura: There are new areas of ground-glass opacity in a patchy distribution in both lungs, most prominent in the left upper lobe. There are new small effusions. Additional dependent lower lobe opacity is noted consistent with atelectasis. No pneumothorax. Limited upper abdomen:  Unremarkable.  Musculoskeletal:  No osteoblastic or osteolytic lesions.   IMPRESSION: 1. Pattern of lung opacities has changed from the prior study. There is now patchy ground-glass opacity that predominates in the upper lobes. The more confluent right lower lobe opacity seen on the prior study has mostly resolved. There are new small effusions. Findings may reflect pulmonary edema. However, there is no interstitial thickening. Multifocal pneumonia is favored.   Assessment and Plan: 74 year old male past medical history of coronary artery disease status post CABG, hypertension, diabetes, OSA on CPAP, diastolic heart failure, recent admissions for recurrent pneumonia seen as hospital follow-up today for hypoxia and COPD optimization. Multifocal Pneumonia Review of his recent hospitalization chest CT shows edema versus pneumonitis, he had 3 recent admissions for recurrent pneumonia. I have reviewed his last 3 CT Chest dating back to 2014, there does not seem to be a component  of ILD at this time.   His recent respiratory distress is most likely  due to multifocal pneumonia, which is appropriately treated, and resolving well now.  Plan: -Repeat CT chest without contrast prior to next follow-up visit -No further need for supplemental oxygen at rest or with exertion, will plan for overnight pulse oximetry study   OSA on CPAP Patient with a known history of OSA on CPAP.  I suspect given his diastolic heart failure and history of  obstructive sleep apnea mixed with COPD he might be retaining CO2 are not oxygenating appropriately during sleep, hence the improvement with supplemental oxygen at night. Continue with 2 L supplemental oxygen on CPAP. We will obtain last sleep study from sleep med.  Plan: -Continue with supplemental oxygen (2 L) with CPAP at night -Overnight pulse oximetry study on room air -CPAP compliance discussed with patient   COPD (chronic obstructive pulmonary disease) COPD - reviewed of CTs shows B\L upper lobes GGO and RLL GGO, mostly favoring a pneumonia, clinically now in the resolving phase - CT reviewed also with mild bilateral upper lobes centrilobular emphysema - cont with inhalers (Advair and Spiriva) - Currently not requiring supplemental oxygen with exertion or at rest based on results from 6 minute walk test         Updated Medication List Outpatient Encounter Prescriptions as of 08/17/2014  Medication Sig  . amLODipine (NORVASC) 2.5 MG tablet Take 2.5 mg by mouth daily.   Marland Kitchen amLODipine (NORVASC) 5 MG tablet Take 5 mg by mouth daily.  Marland Kitchen apixaban (ELIQUIS) 5 MG TABS tablet Take 5 mg by mouth 2 (two) times daily.  Marland Kitchen aspirin EC 81 MG tablet Take 81 mg by mouth daily.  . citalopram (CELEXA) 20 MG tablet Take 20 mg by mouth daily.  . diazepam (VALIUM) 5 MG tablet Take 5 mg by mouth every 12 (twelve) hours as needed.  . Fluticasone-Salmeterol (ADVAIR) 250-50 MCG/DOSE AEPB Inhale 1 puff into the lungs 2 (two) times daily.  . furosemide (LASIX) 20 MG tablet Take 20 mg by mouth daily.  Marland Kitchen HUMALOG 100 UNIT/ML injection 30 Units by Subconjunctival route 3 (three) times daily with meals.   Marland Kitchen LANTUS 100 UNIT/ML injection Inject 95 Units into the skin at bedtime.  . metoprolol (LOPRESSOR) 100 MG tablet Take 100 mg by mouth 2 (two) times daily.  Marland Kitchen NOVOLOG 100 UNIT/ML injection Inject 15 Units into the skin 3 (three) times daily before meals.  Marland Kitchen omeprazole (PRILOSEC) 20 MG capsule Take 20 mg by  mouth daily.  . pravastatin (PRAVACHOL) 20 MG tablet Take 20 mg by mouth at bedtime.  Marland Kitchen tiotropium (SPIRIVA) 18 MCG inhalation capsule Place 18 mcg into inhaler and inhale daily.  . Vitamin D, Ergocalciferol, (DRISDOL) 50000 UNITS CAPS capsule Take 1 capsule by mouth once a week.  . [DISCONTINUED] HUMULIN N 100 UNIT/ML injection Inject 40 Units into the skin 2 (two) times daily.  . [DISCONTINUED] insulin lispro (HUMALOG) 100 UNIT/ML KiwkPen Inject 20 Units into the skin 3 (three) times daily with meals.  . [DISCONTINUED] isosorbide mononitrate (IMDUR) 60 MG 24 hr tablet Take 60 mg by mouth daily.  . [DISCONTINUED] lisinopril-hydrochlorothiazide (PRINZIDE,ZESTORETIC) 20-12.5 MG per tablet Take 2 tablets by mouth 2 (two) times daily.    Orders for this visit: Orders Placed This Encounter  Procedures  . CT Chest Wo Contrast    Standing Status: Future     Number of Occurrences:      Standing Expiration Date: 10/17/2015  Scheduling Instructions:     Please schedule mid June    Order Specific Question:  Reason for Exam (SYMPTOM  OR DIAGNOSIS REQUIRED)    Answer:  f/u pneumonia    Order Specific Question:  Preferred imaging location?    Answer:  [facility]  . Pulse oximetry, overnight    Standing Status: Future     Number of Occurrences:      Standing Expiration Date: 08/17/2015    Scheduling Instructions:     Please set up with [facility] room air     Thank  you for the consultation and for allowing [facility] to assist in the care of your patient. Our recommendations are noted above.  Please contact us if we can be of further service.   Vilinda Boehringer, MD [facility] Office Number: [phone]

## 2014-08-17 NOTE — Patient Instructions (Addendum)
Follow up with Dr. Stevenson Clinch in 3 months - repeat CT chest without contrast prior to next visit, to follow up on your pneumonia - stop using oxygen at rest and with exertion - we will order an overnight pulse oximetry study (ONO) for you. ONO on room air.  - continue using 2L O2 at night with your CPAP machine until you have the ONO study.

## 2014-08-17 NOTE — Progress Notes (Signed)
SMW performed today. 

## 2014-08-18 NOTE — Assessment & Plan Note (Signed)
COPD - reviewed of CTs shows B\L upper lobes GGO and RLL GGO, mostly favoring a pneumonia, clinically now in the resolving phase - CT reviewed also with mild bilateral upper lobes centrilobular emphysema - cont with inhalers (Advair and Spiriva) - Currently not requiring supplemental oxygen with exertion or at rest based on results from 6 minute walk test

## 2014-08-18 NOTE — Assessment & Plan Note (Signed)
Review of his recent hospitalization chest CT shows edema versus pneumonitis, he had 3 recent admissions for recurrent pneumonia. I have reviewed his last 3 CT Chest dating back to 2014, there does not seem to be a component of ILD at this time.   His recent respiratory distress is most likely  due to multifocal pneumonia, which is appropriately treated, and resolving well now.  Plan: -Repeat CT chest without contrast prior to next follow-up visit -No further need for supplemental oxygen at rest or with exertion, will plan for overnight pulse oximetry study

## 2014-08-18 NOTE — Assessment & Plan Note (Signed)
Patient with a known history of OSA on CPAP.  I suspect given his diastolic heart failure and history of obstructive sleep apnea mixed with COPD he might be retaining CO2 are not oxygenating appropriately during sleep, hence the improvement with supplemental oxygen at night. Continue with 2 L supplemental oxygen on CPAP. We will obtain last sleep study from sleep med.  Plan: -Continue with supplemental oxygen (2 L) with CPAP at night -Overnight pulse oximetry study on room air -CPAP compliance discussed with patient

## 2014-08-19 ENCOUNTER — Telehealth: Payer: Self-pay | Admitting: Internal Medicine

## 2014-08-19 NOTE — Telephone Encounter (Signed)
Patient is doing Overnight Oximetry test tonight and was told not to use his oxygen, he wants to know if he should use his CPAP machine or stay off the CPAP machine as well.  I do not see an order in this chart for ONO.    Dr. Stevenson Clinch, please advise.  Thanks.

## 2014-08-19 NOTE — Telephone Encounter (Signed)
He is suppose to have a ONO on room air tonight. Yes, he should still wear his CPAP machine.  Speak to [place] or Suanne Marker about the ONO order.

## 2014-08-19 NOTE — Telephone Encounter (Signed)
Patient notified to wear CPAP, no Oxygen.   Sent to [facility] for follow up on Order.  Sonya - cannot find the order in Epic.

## 2014-08-20 NOTE — Telephone Encounter (Signed)
The order was already placed Nothing is needed

## 2014-08-28 ENCOUNTER — Other Ambulatory Visit: Payer: Self-pay | Admitting: *Deleted

## 2014-08-28 DIAGNOSIS — R7981 Abnormal blood-gas level: Secondary | ICD-10-CM

## 2014-08-28 DIAGNOSIS — J189 Pneumonia, unspecified organism: Secondary | ICD-10-CM

## 2014-08-28 DIAGNOSIS — J439 Emphysema, unspecified: Secondary | ICD-10-CM

## 2014-09-02 ENCOUNTER — Telehealth: Payer: Self-pay | Admitting: *Deleted

## 2014-09-02 NOTE — Telephone Encounter (Signed)
ONO results  - <88% for 5.4 minutes, lowest sat 82%.  Plan: Order 2L O2 via [place] to be worn at night

## 2014-09-02 NOTE — Telephone Encounter (Signed)
-----   Message from Catha Gosselin sent at 09/02/2014  1:50 PM EDT ----- Regarding: ONO on Len[facility] Fellers ONO results given to Dr. Stevenson Clinch yesterday, is he going to order o2 at night for this patient?  Please advise. Thanks, Suanne Marker  You know Corene Cornea is wanting to know!!!

## 2014-09-02 NOTE — Telephone Encounter (Signed)
FYI

## 2014-09-03 ENCOUNTER — Telehealth: Payer: Self-pay | Admitting: Internal Medicine

## 2014-09-03 NOTE — Telephone Encounter (Signed)
Pt cb, per Carylon Perches [facility] is suppose to be contacting pt to set up o2, and Mungal prefers for pt to come in for ov to discuss results, however, the pt does not have an appt sched and is calling in results now. Mungal's first available is not until the week of 4/25

## 2014-09-03 NOTE — Telephone Encounter (Signed)
See phone note 09/03/14 

## 2014-09-03 NOTE — Telephone Encounter (Signed)
Called and spoke to pt and informed him of the results and recs per VM. Appt made with VM for 3 month OV on 6/9. Pt verbalized understanding and denied any further questions or concerns at this time.

## 2014-09-03 NOTE — Telephone Encounter (Signed)
Per 09/02/14 phone note: Catha Gosselin at 09/03/2014 10:51 AM     Status: Signed       Expand All Collapse All   Order faxed to [facility]) for o2 at 2 lpm via  [facility]. Rhonda J Cobb             Vilinda Boehringer, MD at 09/02/2014 3:55 PM     Status: Signed       Expand All Collapse All   ONO results - <88% for 5.4 minutes, lowest sat 82%.  Plan: Order 2L O2 via  to be worn at night       LMTCB x1 for pt

## 2014-09-03 NOTE — Telephone Encounter (Signed)
Order faxed to [facility]) for o2 at 2 lpm via [place] [facility]. Rhonda J Cobb

## 2014-09-04 ENCOUNTER — Telehealth: Payer: Self-pay | Admitting: Internal Medicine

## 2014-09-04 NOTE — Telephone Encounter (Signed)
Spoke with Myriam Jacobson at [facility], reviewed 02 order showing that pt will only need nocturnal 02 according to order.  Pt had a 6 minute walk and did not desaturate.  Nothing further needed.

## 2014-09-04 NOTE — Telephone Encounter (Signed)
Ernest Mclean returned call

## 2014-09-04 NOTE — Telephone Encounter (Signed)
lmtcb X1 for [employer].

## 2014-09-08 NOTE — Op Note (Signed)
PATIENT NAME:  Ernest Mclean, Ernest Mclean MR#:  [id number] DATE OF BIRTH:  May 15, 1941  DATE OF PROCEDURE:  03/25/2012  PREOPERATIVE DIAGNOSIS:  Cataract, left eye.    POSTOPERATIVE DIAGNOSIS:  Cataract, left eye.  PROCEDURE PERFORMED:  Extracapsular cataract extraction using phacoemulsification with placement of an Alcon SN6CWS, 22.5-diopter posterior chamber lens, serial # [id number].  SURGEON:  Loura Back. Ericah Scotto, MD  ASSISTANT:  None.  ANESTHESIA:  4% lidocaine and 0.75% Marcaine in a 50/50 mixture with 10 units/mL of Hylenex added, given as peribulbar.  ANESTHESIOLOGIST:  Dr. Andree Elk   COMPLICATIONS:  None.  ESTIMATED BLOOD LOSS:  Less than 1 mL.  DESCRIPTION OF PROCEDURE:  The patient was brought to the operating room and given a peribulbar block.  The patient was then prepped and draped in the usual fashion.  The vertical rectus muscles were imbricated using 5-0 silk sutures.  These sutures were then clamped to the sterile drapes as bridle sutures.  A limbal peritomy was performed extending two clock hours and hemostasis was obtained with cautery.  A partial thickness scleral groove was made at the surgical limbus and dissected anteriorly in a lamellar dissection using an Alcon crescent knife.  The anterior chamber was entered supero-temporally with a Superblade and through the lamellar dissection with a 2.6 mm keratome.  DisCoVisc was used to replace the aqueous and a continuous tear capsulorrhexis was carried out.  Hydrodissection and hydrodelineation were carried out with balanced salt and a 27 gauge canula.  The nucleus was rotated to confirm the effectiveness of the hydrodissection.  Phacoemulsification was carried out using a divide-and-conquer technique.  Total ultrasound time was 1 minute and 10.4 seconds with an average power of 23.1 percent, CDE 29.59.  Irrigation/aspiration was used to remove the residual cortex.  DisCoVisc was used to inflate the capsule and the internal incision was  enlarged to 3 mm with the crescent knife.  The intraocular lens was folded and inserted into the capsular bag using the AcrySert delivery system.  Irrigation/aspiration was used to remove the residual DisCoVisc.  Miostat was injected into the anterior chamber through the paracentesis track to inflate the anterior chamber and induce miosis.  The wound was checked for leaks and none were found. The conjunctiva was closed with cautery and the bridle sutures were removed.  Two drops of 0.3% Vigamox were placed on the eye.   An eye shield was placed on the eye.  The patient was discharged to the recovery room in good condition.  ____________________________ Loura Back Holley Wirt, MD sad:drc D: 03/25/2012 13:46:53 ET T: 03/25/2012 17:24:13 ET JOB#: [id number]  cc: Remo Lipps A. Brandye Inthavong, MD, <Dictator> Martie Lee MD ELECTRONICALLY SIGNED 04/01/2012 13:16

## 2014-09-11 NOTE — Op Note (Signed)
PATIENT NAME:  Ernest Mclean, Ernest Mclean MR#:  [id number] DATE OF BIRTH:  [birth year]  DATE OF PROCEDURE:  06/10/2012  PREOPERATIVE DIAGNOSIS:  Cataract, right eye.   POSTOPERATIVE DIAGNOSIS:  Cataract, right eye.  PROCEDURE PERFORMED:  Extracapsular cataract extraction using phacoemulsification with placement of an Alcon SN6CWS, 21.5-diopter posterior chamber lens, serial #[id number].  SURGEON:  Loura Back. Falcon Mccaskey, MD  ASSISTANT:  None.  ANESTHESIA:  4% lidocaine and 0.75% Marcaine in a 50/50 mixture with 10 units/mL of Hylenex added, given as a peribulbar.   ANESTHESIOLOGIST:  Julianne Handler, MD  COMPLICATIONS:  None.  ESTIMATED BLOOD LOSS:  Less than 1 ml.  DESCRIPTION OF PROCEDURE:  The patient was brought to the operating room and given a peribulbar block.  The patient was then prepped and draped in the usual fashion.  The vertical rectus muscles were imbricated using 5-0 silk sutures.  These sutures were then clamped to the sterile drapes as bridle sutures.  A limbal peritomy was performed extending two clock hours and hemostasis was obtained with cautery.  A partial thickness scleral groove was made at the surgical limbus and dissected anteriorly in a lamellar dissection using an Alcon crescent knife.  The anterior chamber was entered superonasally with a Superblade and through the lamellar dissection with a 2.6 mm keratome.  DisCoVisc was used to replace the aqueous and a continuous tear capsulorrhexis was carried out.  Hydrodissection and hydrodelineation were carried out with balanced salt and a 27 gauge canula.  The nucleus was rotated to confirm the effectiveness of the hydrodissection.  Phacoemulsification was carried out using a divide-and-conquer technique.  Total ultrasound time was 1 minute and 11 seconds with an average power of 9.2 percent and CDE of 25.11.  Irrigation/aspiration was used to remove the residual cortex.  DisCoVisc was used to inflate the capsule and the  internal incision was enlarged to 3 mm with the crescent knife.  The intraocular lens was folded and inserted into the capsular bag using the AcrySert delivery system. Irrigation/aspiration was used to remove the residual DisCoVisc.  Miostat was injected into the anterior chamber through the paracentesis track to inflate the anterior chamber and induce miosis.  The wound was checked for leaks and none were found. The conjunctiva was closed with cautery and the bridle sutures were removed.  Two drops of 0.3% Vigamox were placed on the eye.   An eye shield was placed on the eye.  The patient was discharged to the recovery room in good condition.  ____________________________ Loura Back Bartolo Montanye, MD sad:sb D: 06/10/2012 13:41:50 ET T: 06/10/2012 13:54:53 ET JOB#: [id number]  cc: Remo Lipps A. Jams Trickett, MD, <Dictator> Martie Lee MD ELECTRONICALLY SIGNED 06/17/2012 13:18

## 2014-09-12 NOTE — Discharge Summary (Signed)
PATIENT NAME:  Ernest Mclean, Ernest Mclean MR#:  [id number] DATE OF BIRTH:  [birth year]  DATE OF ADMISSION:  10/05/2013 DATE OF DISCHARGE:  10/05/2013  PRESENTING COMPLAINT: Anxiety.   DISCHARGE DIAGNOSES: 1. Exerted hypertension.  2. Anxiety.  3.  Type 2 diabetes.  4. Coronary artery disease.   CODE STATUS: Full code.   MEDICATIONS: 1. Metformin 1000 mg b.i.d.  2. Niacin CR 500 mg at bedtime.  3. Omeprazole 20 mg p.o. daily.  4. Hydrochlorothiazide/lisinopril 12.5/20, 1 tablet b.i.d.  5. Metoprolol-XL 100 mg b.i.d.  6. Aspirin 325 mg p.o. daily.  7. Insulin Humalog 30 units in the morning 40 units in the evening.  8. Humulin N 40 units in the morning and 54 units in the evening.  9. Amlodipine 10 mg daily.  10. Pravastatin 20 mg at bedtime.  11. Alprazolam 0.25 p.o. b.i.d. as needed.   FOLLOWUP: 1. Follow up with Dr. Nehemiah Massed in 1 to 2 weeks.  2. Follow with Dr. Lovie Macadamia in 1 to 2 weeks.   CARDIOLOGY CONSULTATION: Dr. Clayborn Bigness.   Cardiac enzymes x 3 negative. Creatinine 1.4. Chest x-ray no acute cardiopulmonary process.   CBC within normal limits, except H and H of 12.1 and 37.8. Glucose is 65. B-type natriuretic peptide is 896.   BRIEF SUMMARY OF HOSPITAL COURSE: [name] is a 74 year old Caucasian obese gentleman with history of coronary artery disease status post CABG, hypertension, diabetes, came in with anxiety and elevated blood pressure. He was admitted with:  1. Anxiety with diaphoresis and accelerated hypertension. The patient was resumed back on all his home medications and given extra dose of nitroglycerin oral along with hydralazine one time. Blood pressure was stabilized. Average blood pressure remained stable. He remained in sinus rhythm on telemetry. The patient did not complain of any chest pain or shortness of breath. Cardiac enzymes x 3 were negative. The patient was seen by Dr. Clayborn Bigness and was okay to go home. Follow up with Dr. Nehemiah Massed as outpatient.  2. Anxiety.  Acute. The patient has some social stressors with family issues with his older son. He was started on Xanax p.r.n. and it seemed to help when given in the Emergency Room.  3. Hypertension. On beta blockers, ACE inhibitors and  hydrochlorothiazide along with amlodipine.  4. Morbid obesity.  5. Hyperlipidemia on statins.   Hospital stay otherwise remained stable.   CODE STATUS: The patient remained a full code.   TIME SPENT: 40 minutes.  ____________________________ Hart Rochester Posey Pronto, MD sap:sg D: 10/06/2013 07:00:18 ET T: 10/06/2013 07:09:10 ET JOB#: [id number]  cc: Mahealani Sulak A. Posey Pronto, MD, <Dictator> Corey Skains, MD Youlanda Roys. Lovie Macadamia, MD    Ilda Basset MD ELECTRONICALLY SIGNED 10/12/2013 16:20

## 2014-09-12 NOTE — Consult Note (Signed)
PATIENT NAME:  Ernest Mclean, Ernest Mclean MR#:  [id number] DATE OF BIRTH:  01/22/1941  DATE OF CONSULTATION:  10/03/2013   CONSULTING PHYSICIAN:  Jhaniya Briski D. Clayborn Bigness, MD  PRIMARY CARE PHYSICIAN: Dr. Lovie Macadamia.  CARDIOLOGIST: Dr. Nehemiah Massed.   INDICATION: Anxiety, possible angina. Known coronary artery disease.   HISTORY OF PRESENT ILLNESS: The patient is a 74 year old white male with history of diabetes, hypertension, coronary artery disease, coronary bypass surgery,  peripheral vascular disease, presented to Emergency Room with anxiety at rest, shortness of breath with sweating and elevated blood pressure. He had a similar episode prior to his bypass surgery. Denied any chest pain. Because of the unusual symptoms he presented to the Emergency Room for evaluation. EKG had nonspecific finding. Cardiac enzymes are negative, but he was admitted for further evaluation and care. He had been having some trouble with the family issues at home which caused worsening anxiety symptoms.   PAST MEDICAL HISTORY: Hypertension, hyperlipidemia, diabetes, coronary artery disease, aortic blockage, iliac blockage, peripheral vascular disease, eosinophilic esophagitis, cataracts.   ALLERGIES: PENICILLIN, CODEINE, VICODIN.   MEDICATIONS: Omeprazole 20 a day, niacin 1 tablet a day, metoprolol 100 mg twice a day, metformin 1000 mg twice a day, lisinopril 20, 2 times a day, Novolin N 40 units daily, aspirin 325 a day.   SOCIAL HISTORY: Smokes 1 pack a day but quit 13 years ago. No alcohol consumption. Married, lives his wife. Retired.   FAMILY HISTORY: Diabetes and hypertension.   REVIEW OF SYSTEMS: Denies blackout spells or syncope. Denies nausea or vomiting. No fever, no chills. He has had some sweats. No weight loss, no weight gain. No hemoptysis or hematemesis. No bright red blood per rectum, anxiety syndrome, mild weakness, mild fatigue. No chest pain.   PHYSICAL EXAMINATION: VITAL SIGNS: Blood pressure 180/80, pulse 75,  respiratory rate 16, afebrile.  HEENT: Normocephalic, atraumatic. Pupils equal, reactive to light.  NECK: Supple, no significant JVD, bruits or adenopathy.  LUNGS: Clear to auscultation and percussion. No significant wheezing, rhonchi, or rale.  HEART: Regular rate and rhythm. Systolic ejection murmur at the apex. Positive S4. PMI nondisplaced.  ABDOMEN: Benign.  EXTREMITIES: Within normal limits.  NEUROLOGIC: Intact.  SKIN: Normal   LABORATORIES: Chest x-ray is unremarkable. BNP 900, BUN 29, creatinine 1.6. CBC normal. Troponin 0.02.   EKG: Normal sinus rhythm, nonspecific ST-T changes.   ASSESSMENT:  1. Anxiety.  2. Hypertension.  3. Coronary artery disease.  4. Peripheral vascular disease.  5. Mild obesity.  6. Hyperlipidemia.  7. Mild renal insufficiency.  8. Possible angina.   PLAN: 1. Agree with admit. Rule out for myocardial infarction. Follow up cardiac enzymes. Follow-up EKG, telemetry.  2. Continue current medications for coronary artery disease with aspirin therapy, nitrates as necessary.  3. Hypertension recommend more aggressive hypertension control. Elevated blood pressure may be related to anxiety. Beta blockers would be helpful by increasing the dose and continue ACE inhibitor. Again, consider adding nitrates or Imdur and possibly a diuretic to help with his hypertension.  4. For gastroesophageal reflux disease, continue omeprazole therapy.  5. Diabetes. Maintain Novolin as necessary.  6. For lipids he is on niacin. It is not clear whether  he can tolerate a statin, but would recommend statin therapy if the patient is able to tolerate it.  7. For  anxiety would consider benzodiazepines for now. Have the patient follow up with Dr. Nehemiah Massed as an outpatient for a functional study or cardiac catheter.  8. For peripheral vascular disease would consider follow-up with  vascular and maybe adding Plavix.  ____________________________ Loran Senters. Clayborn Bigness,  MD ddc:sg D: 10/05/2013 11:07:19 ET T: 10/05/2013 12:17:37 ET JOB#: [id number]  cc: Lien Lyman D. Clayborn Bigness, MD, <Dictator> Yolonda Kida MD ELECTRONICALLY SIGNED 11/10/2013 0:46

## 2014-09-12 NOTE — H&P (Signed)
PATIENT NAME:  Ernest Mclean MR#:  [id number] DATE OF BIRTH:  May 11, 1941  DATE OF ADMISSION:  10/05/2013  PRIMARY CARE PHYSICIAN:  Dr. Juluis Pitch.   REFERRING PHYSICIAN:  Dr. Ponciano Ort.   CHIEF COMPLAINT:  Anxiety.   HISTORY OF PRESENT ILLNESS:  Ernest Mclean is a 74 year old male with history of diabetes mellitus, hypertension, coronary artery disease, status post coronary artery bypass grafting, iliac acute blockage status post surgery, comes to the Emergency Department with sudden onset of anxiety while at rest.  The patient started experiencing severe anxiety, diaphoretic.  Checked his blood pressure, systolic blood pressure was in 180s.  The patient had a similar episode 10 years back when the patient required coronary artery bypass grafting.  Despite waiting for some time the patient's symptoms continued to get worse.  Concerning this, came to the Emergency Department.  Work-up in the Emergency Department, initial set of troponin is negative.  The patient has mild elevation of the BNP of 896.  The patient has T wave inversions in the lateral leads, however we do not have the patient's baseline.  The patient denies having any cough, shortness of breath.  The patient usually walks one mile in the morning and one mile in the afternoon without experiencing any chest pain.  Experiences some right leg claudication; however, which gets resolved by getting some rest.   PAST MEDICAL HISTORY: 1.  Hypertension.  2.  Hyperlipidemia.  3.  Diabetes mellitus, insulin-dependent.  4.  Coronary artery disease, status post coronary artery bypass grafting.  5.  Aortic blockage.  6.  Iliac blockage.  7.  Eosinophilic esophagitis.  8.  Cataract surgery.  ALLERGIES:  1.  PENICILLIN.  2.  CODEINE.  3.  VICODIN.   HOME MEDICATIONS: 1.  Omeprazole 20 mg once a day.  2.  Niacin extended release 1 capsule once a day.  3.  Metoprolol 100 mg twice daily.  4.  Metformin 1000 mg 2 times a day.  5.   Lisinopril 20 mg 2 times a day.  6.  Novolin N 40 units daily.  7.  Aspirin 325 mg daily.   SOCIAL HISTORY:  Smoked 1 pack a day, quit about 13 years back.  Denies drinking alcohol or using illicit drugs.  Married, lives with his wife.  Currently retired.   FAMILY HISTORY:  Diabetes mellitus.   REVIEW OF SYSTEMS:   CONSTITUTIONAL:  Denies any generalized weakness.  EYES:  No change in vision.  EARS, NOSE, THROAT:  No change in hearing.  RESPIRATORY:  No cough, shortness of breath.  CARDIOVASCULAR:  No chest pain, palpations.  GASTROINTESTINAL:  No nausea, vomiting, abdominal pain.  GENITOURINARY:  No dysuria or hematuria.  ENDOCRINE:  No polyuria, polydipsia.  HEMATOLOGIC:  No easy bruising or bleeding.  SKIN:  No rash or lesions.  MUSCULOSKELETAL:  No joint pains and aches.  NEUROLOGIC:  No weakness or numbness in any part of the body.   PHYSICAL EXAMINATION: GENERAL:  This is a well-built, well-nourished, age-appropriate male lying down in the bed, not in distress.  VITAL SIGNS:  Temperature 98.4, pulse 74, blood pressure 184/81, respiratory rate of 18, oxygen saturation is 96% on room air.  HEENT:  Head normocephalic, atraumatic.  Eyes, no scleral icterus.  Conjunctivae normal.  Pupils equal and react to light.  Extraocular movements are intact.  Mucous membranes moist.  No pharyngeal erythema.  NECK:  Supple.  No lymphadenopathy.  No JVD.  No carotid bruit.  No thyromegaly.  CHEST:  Has no focal tenderness.  LUNGS:  Bilateral clear to auscultation.  HEART:  S1 and S2 regular.  No murmurs are heard.  ABDOMEN:  Bowel sounds plus.  Soft, nontender, nondistended.  No hepatosplenomegaly.  Obese abdomen.  EXTREMITIES:  Has mild 1+ pitting edema, has somewhat decreased pulses in both feet.  SKIN:  No rash or lesions.  MUSCULOSKELETAL:  Good range of motion in all the extremities.  NEUROLOGIC:  The patient is alert, oriented to place, person, and time.  Cranial nerves II through XII  intact.  Motor 5 by 5 in upper and lower extremities.   LABORATORY DATA:  Chest x-ray, one view portable:  No acute cardiopulmonary disease.   BNP 900.  BMP:  BUN 29, creatinine of 1.6.  CBC is completely within normal limits.  Troponin less than 0.02.   EKG, 12-lead:  T wave inversions or ST depressions in the lateral leads.   ASSESSMENT AND PLAN:  Ernest Mclean is a 74 year old male who comes to the Emergency Department with symptoms of anxiety, diaphoresis.  1.  Anxiety with diaphoresis.  The patient states this is similar to when the patient had required contact coronary artery bypass grafting.  The patient follows up with Dr. Nehemiah Massed.  We will obtain cardiology consult in the morning.  Continue to cycle cardiac enzymes x 3.  Admit the patient to the monitored bed.  Continue with aspirin, lisinopril and metoprolol as well as statin.  2.  Hypertension, poorly controlled.  This could be a combination of anxiety, possibility of underlying coronary artery disease.  Continue to follow up.  Keeping the patient on home medications.  3.  Diabetes mellitus.  We will hold the metformin for now.  Continue with insulin and sliding scale insulin.  4.  Renal insufficiency.  We do not have the patient's baseline.  Current creatinine is 1.6.  5.  Keep the patient on deep vein thrombosis prophylaxis with Lovenox.   TIME SPENT:  50 minutes.     ____________________________ Monica Becton, MD pv:ea D: 10/05/2013 01:19:42 ET T: 10/05/2013 02:04:14 ET JOB#: [id number]  cc: Monica Becton, MD, <Dictator> Youlanda Roys. Lovie Macadamia, MD Monica Becton MD ELECTRONICALLY SIGNED 10/17/2013 0:40

## 2014-09-20 NOTE — H&P (Signed)
PATIENT NAME:  Ernest Mclean, TRICKEY MR#:  [id number] DATE OF BIRTH:  12/09/1940  DATE OF ADMISSION:  06/19/2014  PRIMARY CARE PHYSICIAN:  Juluis Pitch, MD.    CARDIOLOGIST:  Serafina Royals, MD.   CHIEF COMPLAINT: Coughing.   HISTORY OF PRESENT ILLNESS: This is a 74 year old man with a history of heart disease. He presents with being sent in from the urgent care for a low pulse oximetry of 89%. He has been coughing and he has been wheezing. He has had a sore throat. He has been coughing up light green phlegm. He has been off balance. In the ER he had a negative chest x-ray. Hospitalist services were contacted for further evaluation when a troponin was borderline at 0.11. No complaints of chest pain. The patient does have some shortness of breath.   PAST MEDICAL HISTORY: Sleep apnea, coronary artery disease, anxiety, depression, brittle diabetes, peripheral vascular disease with aorta and right iliac blockages, claudication of the lower extremities, gastroesophageal reflux disease.   PAST SURGICAL HISTORY: Cataracts, CABG, collarbone surgery, and hernia repair.   ALLERGIES: PENICILLIN, CODEINE, AND VICODIN.   MEDICATIONS: As per prescription writer, include alprazolam 0.25 mg twice a day as needed for anxiety, amlodipine 10 mg daily, aspirin 325 mg daily, Humulin N 40 units subcutaneous injection in the morning, 54 units in the evening, Humalog 30 units once a day in the morning, 40 units in the evening, hydrochlorothiazide/lisinopril 12.5/20 two tablets twice a day, Imdur 30 mg daily, metformin 1000 mg twice a day, metoprolol tartrate 100 mg twice a day, omeprazole 20 mg in the morning, pravastatin 20 mg at bedtime.   SOCIAL HISTORY: Quit smoking in 2003, smoked 1 pack per day for 30 years. No alcohol. No drug use. Used to work in [employer], applications.   FAMILY HISTORY: Father died at 44 of a cerebral hemorrhage. Mother died of old age.    REVIEW OF SYSTEMS:   CONSTITUTIONAL: Positive for fever. No  chills. No sweats. Positive for weakness. Positive for weight gain.  EYES: He does wear glasses.  EARS, NOSE, MOUTH, AND THROAT: Decreased hearing. Positive for sore throat.  No difficulty swallowing.  CARDIOVASCULAR: No chest pain. No palpitations.  RESPIRATORY: Positive for shortness of breath. Positive for cough. Positive for wheeze, coughing up greenish phlegm. No hemoptysis.  GASTROINTESTINAL: Positive for abdominal pain the other day, diarrhea the other day. No nausea or vomiting. No bright red blood per rectum. No melena.  GENITOURINARY: No burning on urination. No hematuria.  MUSCULOSKELETAL: Positive for leg cramps and right hand pain.  PSYCHIATRIC: Positive for anxiety and depression.  ENDOCRINE: No thyroid problems.  HEMATOLOGIC AND LYMPHATIC: No anemia. No easy bruising or bleeding.   PHYSICAL EXAMINATION:  VITAL SIGNS: Temperature 99.7, pulse 64, respirations 20, blood pressure 152/63, pulse oximetry 91% on room air.  GENERAL: No respiratory distress.  EYES: Conjunctivae and lids normal. Pupils equal, round, and reactive to light. Extraocular muscles intact. No nystagmus.  EARS, NOSE, MOUTH, AND THROAT: Tympanic membrane on the left bulging and erythematous, on the right negative. Nasal mucosa, no erythema. Throat, slight erythema. No exudate seen. Lips and gums, no lesions.  NECK: No JVD. No bruits. No lymphadenopathy. No thyromegaly. No thyroid nodules palpated.  RESPIRATORY:  Lungs, poor air entry bilaterally. Positive expiratory wheeze. No use of accessory muscles to breathe.  CARDIOVASCULAR SYSTEM: S1 and S2 soft. No gallops, rubs, or murmurs heard. Carotid upstroke 2 + bilaterally. No bruises. Dorsalis pedis pulses 2 + bilaterally. 2 + edema of  the lower extremity.  ABDOMEN: Soft, nontender. No organomegaly/splenomegaly. Normoactive bowel sounds.  EXTREMITIES: 2 + edema. No clubbing. No cyanosis.  SKIN: No rashes or ulcers seen.  NEUROLOGIC: Cranial nerves II through XII  grossly intact. Deep tendon reflexes 2 + bilateral lower extremities.  PSYCHIATRIC: The patient is oriented to person, place, and time.  LABORATORY AND RADIOLOGICAL DATA: Chest x-ray negative. Troponin borderline at 0.11. Glucose 223, BUN 25, creatinine 1.53, sodium 136, potassium 4.2, chloride 102, CO2 of 29, calcium 8.5. White blood count 6.3, H and H 12.5 and 39.0, platelet count of 192,000. EKG, sinus rhythm, first-degree AV block, left axis deviation, LVH.    ASSESSMENT AND PLAN:  1.  Asthmatic bronchitis, poor air entry, Emergency Room physician gave Solu-Medrol 125 mg IV x 1. I will give budesonide nebulizers and nebulizer treatments, IV Zithromax. Will try to hold off on Solu-Medrol, but I will probably have to give Solu-Medrol 40 mg q. 12 hours. I am hesitant because the patient is a brittle diabetic.  2.  Elevated troponin, likely with respiratory symptoms. With his history of coronary artery disease we will monitor on telemetry. Continue his aspirin and metoprolol and statin. The patient does see Dr. Nehemiah Massed as outpatient. We will put in a consult. We will get serial troponins. 3.  Anxiety and depression. P.r.n. Xanax.  4.  Peripheral vascular disease, on aspirin.  5.  Gastroesophageal reflux disease without esophagitis. Continue PPI while here.  6.  Brittle diabetes. We will put on high dose sliding scale. Sugars will likely be high with Solu-Medrol.  7.  Sleep apnea. We will put on CPAP at night.  8.  Hyperlipidemia, unspecified. Continue statin.  9.  Hypertension, essential. Continue usual medications.   TIME SPENT ON ADMISSION: 55 minutes.   CODE STATUS: The patient is a full code.    ____________________________ Tana Conch. Leslye Peer, MD rjw:bu D: 06/19/2014 15:57:02 ET T: 06/19/2014 16:20:32 ET JOB#: [id number]  cc: Tana Conch. Leslye Peer, MD, <Dictator> Youlanda Roys. Lovie Macadamia, MD Corey Skains, MD  Marisue Brooklyn MD ELECTRONICALLY SIGNED 06/22/2014 15:41

## 2014-09-20 NOTE — Consult Note (Signed)
PATIENT NAME:  Ernest Mclean, Ernest Mclean MR#:  [id number] DATE OF BIRTH:  07-07-1940  DATE OF CONSULTATION:  06/22/2014  REFERRING PHYSICIAN:  Dr. Bridgett Larsson  CONSULTING PHYSICIAN:  Dwayne D. Clayborn Bigness, MD  PRIMARY CARE PHYSICIAN:  Youlanda Roys. Lovie Macadamia, MD  CARDIOLOGIST:  Serafina Royals, MD   INDICATION: Shortness of breath, hypoxemia, elevated troponin.  HISTORY OF PRESENT ILLNESS: The patient is a 74 year old white male with history of coronary artery disease, sleep apnea, recently discharged from the hospital after a respiratory admission with bronchitis and hypoxemia, seemed to have improved, but then at home got more short of breath and more hypoxic with sats in the 60s and 70s.  The patient felt dyspneic, denied any pain.  No leg edema, but was significantly short of breath, weak and fatigued.  The patient was taking Zithromax and prednisone at discharge without improvement, so was brought back to the hospital for evaluation.  Chest x-ray suggested possible multifocal pneumonia with hypoxemia.  He was admitted for further evaluation and care. Troponin was slightly elevated.  Cardiology consultation was recommended.  He has  history of coronary artery bypass surgery in the past.   PAST MEDICAL HISTORY: Coronary artery disease, sleep apnea, hypertension, diabetes, anxiety, depression, peripheral vascular disease, GERD, bronchitis. past history of smoking.   SOCIAL HISTORY: Married, retired, past history of smoking, no alcohol consumption.   PAST SURGICAL HISTORY: Coronary artery bypass surgery, hernia repair, cataract,  surgery.  ALLERGIES:  PENICILLIN, CODEINE, VICODIN.  HOME MEDICATIONS: Prednisone taper, omeprazole 20 mg a day, Lopressor 100 mg twice a day, metformin 1000 twice a day, Imdur 30 mg a day, hydrochlorothiazide lisinopril 12.5/20 mg twice a day, Humulin N 40 units once a day in the morning, 54 units in the evening, Humalog 30 units subcutaneously in the morning, 40 units in the evening, Benadryl  25 mg every 6 hours as needed, Valium 5 mg every 12 p.r.n., citalopram 20 mg a day, Zithromax 5 mg a day for the next 4 days, aspirin 325 daily, Norvasc 20 mg a day.  REVIEW OF SYSTEMS: Dyspnea, shortness of breath, minimal respiratory distress, , or syncope. Denies nausea or vomiting. No fever. No chills, sweats, no weight loss, no weight gain.  No hemoptysis, hematemesis, bright red blood per rectum, mild congestion,  weakness and fatigue.  PHYSICAL EXAMINATION:   VITAL SIGNS: Blood pressure 140/60, pulse 70, respiratory rate 18, afebrile.  HEENT: Normocephalic, atraumatic. Pupils equal and reactive to light.  NECK:  Supple, no significant JVD.  LUNGS: Bilateral rhonchi.  No significant rales, minimal scattered expiratory wheeze. HEART: Distant. Regular. Systolic ejection murmur at the apex. PMI nondisplaced.  ABDOMEN:  Exam is benign.    NEUROLOGIC:  negative no significant neurological fine   SKIN:  Normal.    IMAGING AND LABORATORY DATA:  CT of the chest showed no bony embolus but evidence of multifocal pneumonia. Chest x-ray mild interstitial increased markings. BNP was above 3000.  CBC was normal BUN 37, creatinine 1.6, troponin was slightly elevated at 0.09.  EKG showed nonspecific ST-T changes.   IMPRESSION:   1.  Multifocal pneumonia. 2.  Chronic obstructive pulmonary disease. 3.  Bronchitis. 4.  Chronic renal insufficiency stage III 5.  Hypertension. 6.  Diabetes. 7.  Obstructive sleep apnea, 8.  Respiratory failure. 9.  Hypoxemia  mildly elevated troponins.  10. Coronary artery disease.   PLAN:  Recommend admit with respiratory support, supplemental oxygen, broad spectrum antibiotics, sputum culture, blood culture, steroid therapy. I would also recommend pulmonary input  for further evaluation and management of his multifocal pneumonia.  We have switched from Zithromax to a broad-spectrum antibiotic.  Continue hypertension management and control.  For diabetes, recommend  insulin sliding scale. For obstructive sleep apnea, I would recommend CPAP at night. Consider repeat sleep study with tailoring CPAP therapy, elevated troponins, probably demand ischemia. We will continue to follow. Follow up troponins, but I recommend direct cardiac studies at this stage.  Coronary artery disease appears to be stable. No recent anginal symptoms. I do not believe this is heart failure. We will continue lisinopril and Lopressor. I do not recommend significant Lasix therapy for now unless the patient's shortness of breath persists and then we will try a mild diuresis.  We will continue aspirin and statin therapy. We will continue to follow the patient, but I think pulmonary evaluation and respiratory support with antibiotic therapy should be the main stay for the current management.      ____________________________ Loran Senters. Clayborn Bigness, MD ddc:DT D: 06/23/2014 07:32:59 ET T: 06/23/2014 08:15:17 ET JOB#: [id number]  cc: Dwayne D. Clayborn Bigness, MD, <Dictator> Yolonda Kida MD ELECTRONICALLY SIGNED 06/30/2014 17:33

## 2014-09-20 NOTE — Discharge Summary (Signed)
PATIENT NAME:  Dipinto, Ernest Mclean MR#:  [id number] DATE OF BIRTH:  1940-11-12  DATE OF ADMISSION:  07/13/2014 DATE OF DISCHARGE:  07/17/2014  ADMITTING PHYSICIAN: Barnetta Chapel P. Volanda Napoleon, MD  DISCHARGING PHYSICIAN: Gladstone Lighter, MD   PRIMARY CARE PHYSICIAN: Youlanda Roys. Lovie Macadamia, Jerome:  1.  Cardiology consultation by Dr. Nehemiah Massed.  2.  Endocrinology consultation by Dr. Lavone Orn.  3.  Pulmonary critical care consultation by Dr. Vilinda Boehringer.   DISCHARGE DIAGNOSES:  1.  Hypotension from hypertensive medications.  2.  Congestive heart failure exacerbation with diastolic dysfunction.  3.  Brittle diabetes mellitus.  4.  Obstructive sleep apnea on CPAP.  5.  Chronic obstructive pulmonary disease on 2 L home oxygen.  6.  Hypertension.  7.  Atrial fibrillation, new onset.  8.  Chronic kidney disease.  9.  Acute renal failure on presentation.  10.  Coronary artery disease status post bypass graft surgery.   DISCHARGE MEDICATIONS:  1.  Eliquis 5 mg p.o. b.i.d.  2.  Lantus 50 units subcutaneous once a day at bedtime.  3.  Aspirin 81 mg p.o. daily.  4.  Lasix 20 mg p.o. daily.  5.  Norvasc 5 mg p.o. daily.  6.  Additional 2.5 mg of Norvasc to be given if his systolic blood pressure greater than 155 in presence of home health nurse and instructions provided to recheck his blood pressure. If his systolic is greater than 353, please call primary care physician.   7.  Humalog sliding scale insulin, as per discharge instructions.  8.  Humalog 15 units t.i.d. with meals.  9.  Celexa 20 mg p.o. daily.  10.  Diazepam 5 mg q. 12 hours p.r.n. for anxiety.  11.  Pravastatin 20 mg p.o. at bedtime.  12.  Metoprolol 100 mg p.o. b.i.d.  13.  Advair 250/50 one puff b.i.d.  14.  Spiriva 18 mcg inhalation capsule daily.  15.  Omeprazole 20 mg p.o. daily.   DISCHARGE HOME OXYGEN: 2 L.   DISCHARGE DIET: Low-sodium and 1800 calorie diet.   DISCHARGE ACTIVITY: As tolerated.     FOLLOWUP INSTRUCTIONS:  1.  Cardiology followup with Dr. Nehemiah Massed 1-2 weeks.  2.  Endocrinology followup with Dr. Gabriel Carina in 1 week.  3.  Nephrology followup with Dr. Holley Raring or Dr. Candiss Norse as a new patient in 3-4 weeks.  4.  Pulmonary followup with Dr. Stevenson Clinch as prior scheduled.  5.  PCP followup in 2 weeks.  6.  Home health, physical therapy and nursing.  7.  Blood pressure instructions as noted in the discharge instructions.   LABORATORY AND IMAGING STUDIES PRIOR TO DISCHARGE:  1.  Serum sodium 141, potassium 4.5, chloride 104, bicarbonate 30, BUN 26, creatinine 1.5, glucose 155, calcium of 9.1.  2.  WBC is 5.9, hemoglobin is 10.1, hematocrit 32.0, platelet count is 172,000.  3.  CT of the chest done without contrast showing pattern of lung opacities change showing patchy ground glass opacity predominately in the upper lobes, confluent right lower lobe opacity is mostly resolved. Small effusions. No interstitial thickening. Multifocal pneumonia is favored.   4.  Clostridium difficile tests are negative. Stool cultures are negative.  5.  Troponin barely elevated at 0.11, likely demand ischemia.  6.  Blood cultures negative on admission.   BRIEF HOSPITAL COURSE: Mr. Ernest Mclean is a 74 year old Caucasian male with multiple medical problems including coronary artery disease status post bypass graft surgery, history of diastolic CHF, COPD on 2  L home oxygen, obstructive sleep apnea, hypertension, diabetes, anxiety, depression, who was recently admitted to the hospital 2 weeks ago prior to this admission for pneumonia and on admission prior to that for COPD. He was discharged back home and presents with weakness and noted to have hypotension.  1.  Hypotension. The patient does have a history of labile blood pressure and his wife very strictly monitors his fluid intake and also his sodium intake. His blood pressure medications were recently adjusted as an outpatient. It seems like Imdur was increased  and wife feels like his blood pressure dropped to the result of that. In the hospital, after he was admitted, after her some IV fluids and holding his blood pressure medications, his blood pressure has improved. He was on metoprolol all the while, Norvasc was restricted. His lisinopril HCTZ and Imdur were still on hold. Norvasc is at half dose. His blood pressure has been actually in 644-034 range systolic at the time of discharge. I advised wife to increase the Norvasc by 2.5 mg if the blood pressure stays like this at home. She does periodically monitor blood pressure and he also has a home health nurse coming for that. The patient's wife wants to keep his blood pressure high for now and make sure it does not drop. She is very, very involved in his care. So, we will give another prescription for 2.5 mg of Norvasc to be used as needed, but his blood pressure has been stable in the hospital.  2.  Brittle diabetes mellitus. The patient also was having trouble with his sugars going as high as 425 at home and dropping in the hospital to as low as in his 4s. His A1c is not that elevated. His HbA1c recently has been 10.8. Seen by endocrinologist, Dr. Gabriel Carina. Medications were adjusted. He is Humalog t.i.d. with meals 15 units and Lantus 50 units at bedtime and also sliding scale insulin and she will follow up with him as an outpatient next week. His sugars are better, improved after the Lantus from 100-250 range at this time. He clinically feels well.  3.  Acute CHF exacerbation, diastolic dysfunction that happened in the hospital likely from all the fluids he received when he came in for hypotension. Seen by Dr. Nehemiah Massed. Being discharged on low dose Lasix.  4.  COPD and sleep apnea. Continue to use CPAP and 2 L home oxygen. Seen by Dr. Stevenson Clinch per COPD GOLD protocol. He is supposed to see Dr. Stevenson Clinch anyway as an outpatient. CT chest showed actually resolving of his infiltrate from previous admission, so initially  antibiotics were started empirically, but no evidence of clinical pneumonia, so antibiotics are discontinued at the time of discharge. He will continue to take his inhalers and use oxygen and do outpatient followup.  5.  History of CAD status post CABG. Continue his cardiac medication. Seen by Dr. Nehemiah Massed in the hospital.  6.  Chronic renal failure. The patient not seeing a nephrologist but with his CHF, Lasix, advised to follow up with a nephrologist as an outpatient. He had mild renal insufficiency on admission with creatinine greater than 2 that improved up to 1.5 at the time of discharge. His baseline seems to around 1.7 based on previous admissions.  7.  New onset atrial fibrillation. Not symptomatic. Incidentally found. Started on heparin drip initially, changed over to Eliquis at the time of discharge. His rate is controlled. He is on metoprolol b.i.d. p.r.n.   His course has been otherwise uneventful  in the hospital. He was relatively stable prior to discharge.   DISCHARGE CONDITION: Stable.   DISCHARGE DISPOSITION: Home with home health.   TIME SPENT ON DISCHARGE: 45 minutes.    ____________________________ Gladstone Lighter, MD rk:bm D: 07/17/2014 16:12:19 ET T: 07/18/2014 03:59:55 ET JOB#: [id number]  cc: Gladstone Lighter, MD, <Dictator> Corey Skains, MD Vilinda Boehringer, MD [place] Lilian Kapur, MD A. Lavone Orn, MD Youlanda Roys. Lovie Macadamia, MD Gladstone Lighter MD ELECTRONICALLY SIGNED 08/06/2014 17:49

## 2014-09-20 NOTE — H&P (Signed)
PATIENT NAME:  Ernest Mclean, Ernest Mclean MR#:  [id number] DATE OF BIRTH:  [birth year]  DATE OF ADMISSION:  06/22/2014  PRIMARY CARE PHYSICIAN: Youlanda Roys. Lovie Macadamia, MD   REFERRING PHYSICIAN: [place]. CHIEF COMPLAINT: Shortness of breath, cough with sputum and hypoxia.   HISTORY OF PRESENT ILLNESS: A 74 year old Caucasian male with history of CAD, sleep apnea on CPAP, presented to the ED with the above chief complaint. The patient was recently hospitalized for acute bronchitis. He was discharged to home 2 days ago with oxygen 2 liters by nasal cannula. After discharge to home, the patient still has a cough with greenish sputum and shortness of breath. The patient's oxygen saturation decreased to 69% even on 4 liters oxygen today, so the patient came to the ED for further evaluation. The patient denies any fever or chills, no chest pain, palpitations. No orthopnea, nocturnal dyspnea, or leg edema. The patient has been taking prednisone and Zithromax since discharge. The patient denies any other symptoms.   PAST MEDICAL HISTORY: CAD, sleep apnea, hypertension, diabetes, anxiety, depression, PVD, GERD.   SOCIAL HISTORY: No smoking or drinking or illicit drugs. Remote smoker.   PAST SURGICAL HISTORY: CABG, hernia repair, cataract, and collar bone surgery.   ALLERGIES: PENICILLIN, CODEINE, AND VICODIN.   HOME MEDICATIONS:  1.  Prednisone 10 mg p.o. 5 tablets once a day then taper.  2.  Omeprazole 20 mg p.o. daily.  3.  Lopressor 100 mg p.o. b.i.d.  4.  Metformin 1000 mg p.o. b.i.d.  5.  Imdur 30 mg p.o. daily. 6.  Hydrochlorothiazide/ lisinopril 12.5 mg/20 mg p.o. 2 tablets twice a day. 7.  Humulin N 40 units subcutaneous once a day in the morning and 54 units in the evening.  8.  Humalog 30 units subcutaneous in the morning and 40 units in the evening. 9.  Benadryl 25 mg p.o. every 6 hours p.r.n.  10.  Diazepam 5 mg p.o. q. 12 hours p.r.n.  11.  Citalopram  20 mg p.o. once a day. 12.  Zithromax 500 mg  p.o. once a day for 3 days.  13.  Aspirin 325 mg p.o. daily.  14.  Norvasc 10 mg p.o. daily.   REVIEW OF SYSTEMS: CONSTITUTIONAL: The patient denies any fever or chills. No headache or dizziness, but has weakness.  EYES: No double vision or blurred vision.  EARS, NOSE, AND THROAT: No postnasal drip, slurred speech or dysphagia.  CARDIOVASCULAR: No chest pain, palpitation. No orthopnea or nocturnal dyspnea. No leg edema.  PULMONARY: Positive for cough with greenish sputum and shortness of breath, hypoxia. No wheezing or hemoptysis.  GASTROINTESTINAL: No abdominal pain, nausea, vomiting, diarrhea. No melena or bloody stool.  GENITOURINARY: No dysuria, hematuria, or incontinence.  SKIN: No rash or jaundice.  NEUROLOGIC: No syncope, loss of consciousness, or seizure.  ENDOCRINE: No polyuria, polydipsia, heat or cold intolerance. HEMATOLOGY: No easy bleeding or bleeding.   PHYSICAL EXAMINATION:  VITAL SIGNS: Temperature 99.2, blood pressure 149/62, pulse 65, oxygen saturation 97% on oxygen, was 88% on oxygen.  GENERAL: The patient is alert, awake, oriented, in no acute distress.  HEENT: Pupils round, equal and reactive to light and accommodation. Moist oral mucosa. Clear oropharynx.  NECK: Supple. No JVD or carotid bruit. No lymphadenopathy. No thyromegaly.  CARDIOVASCULAR: S1 and S2, regular rate and rhythm. No murmurs or gallops.  PULMONARY: Bilateral air entry. Bilateral limited air entry, very weak breath sounds, but no crackles or rales. No use of accessory muscles to breathe.  ABDOMEN: Soft. No distention  or tenderness. No organomegaly. Bowel sounds present. Obese.  EXTREMITIES: No edema, clubbing, or cyanosis. No calf tenderness. Bilateral pedal pulses present.  SKIN: No rash or jaundice.  NEUROLOGIC: A and O x 3. No focal deficit. Power 5/5. Sensory intact.   IMAGING: CT angiogram of chest did not show any PE, but has some multifocal pneumonia with a mixed ground glass nodule  opacities in peribronchovascular distribution of all lobes.   Chest x-ray stable, nonspecific mild prominence of interstitial lung markings without focal acute findings.   LABORATORY DATA: BNP 2880. CBC in normal range except hemoglobin 11.8, glucose 246, BUN 37, creatinine 1.6. Electrolytes are normal. Troponin 0.09.   EKG showed normal sinus rhythm at 69 BPM with first-degree AV block.   IMPRESSIONS:  1.  Multifocal pneumonia.  2.  Acute respiratory failure with hypoxia.  3.  Elevated troponin, possibly due to demand ischemia. 4.  Diabetes.  5.  Obstructive sleep apnea, on CPAP at home.  6.  Hypertension.  7.  Obesity.  8.  Chronic kidney disease stage 3.   PLAN OF TREATMENT:  1.  The patient will be admitted to telemetry floor and will continue oxygen by nasal cannula. Continue prednisone, then taper. We will hold Zithromax and start Levaquin and follow up blood culture, sputum culture. Also, we will give nebulizer treatment.  2.  For hypertension, we will continue the patient's home hypertension medication.  3.  For diabetes, we will start sliding scale,  change to levemir and novolog. 4.  For OSA and obesity, continue CPAP at night.  5.  For elevated troponin, which is possibly due to demand ischemia due to hypoxia and pneumonia, we will follow up troponin level. Continue aspirin and a statin.   I discussed the patient's condition and plan of treatment with the patient and the patient's wife.   TIME SPENT: About 55 minutes.    ____________________________ Demetrios Loll, MD qc:TT D: 06/22/2014 14:48:20 ET T: 06/22/2014 15:24:15 ET JOB#: [id number]  cc: Demetrios Loll, MD, <Dictator> Demetrios Loll MD ELECTRONICALLY SIGNED 06/23/2014 21:40

## 2014-09-20 NOTE — Discharge Summary (Signed)
PATIENT NAME:  Ernest Mclean, Ernest Mclean MR#:  [id number] DATE OF BIRTH:  [birth year]  DATE OF ADMISSION:  06/22/2014 DATE OF DISCHARGE:  06/26/2014  PRIMARY CARE PHYSICIAN:  Youlanda Roys. Lovie Macadamia, MD  ADMITTING COMPLAINT: Shortness of breath.   DISCHARGE DIAGNOSES:  1.  Acute respiratory failure with hypoxia.  2.  Diastolic heart failure with preserved ejection fraction.  3.  Likely chronic obstructive pulmonary disease.  4.  Acute bronchitis.  5.  Obstructive sleep apnea on CPAP.  6.  Diabetes mellitus type 2, uncontrolled with hemoglobin A1c 10.8.  7.  Hypertension.  8.  Anxiety.  9.  Coronary artery disease, status post coronary artery bypass graft.  10.  Hypertension.  11.  Peripheral vascular disease.  12.  Gastroesophageal reflux disease.   CONSULTATIONS:  1.   Dwayne D. Clayborn Bigness, MD, cardiology.  2.   Mariane Duval, MD, pulmonology.   PROCEDURES:  1.  A 2-D echocardiogram on 06/23/2014 shows left ventricular ejection fraction 55%  to 60%. Normal global left ventricular systolic function. Mildly increased left ventricular septal thickness with mildly dilated right atrium. Mildly elevated pulmonary artery systolic pressures. Moderately increased left ventricular posterior wall thickness. Mild tricuspid regurgitation.  2.  CT angiography of the chest for PE 06/22/2014 shows multifocal pneumonia with mixed ground glass and nodular opacities in a peribronchovascular distribution all lobes, though predominantly in the lower lobes. Study is negative for pulmonary emboli, though distal subsegmental vessels are not well visualized given respiratory motion. Evidence of native coronary artery disease, status post median sternotomy and CABG.  3.  Chest x-ray was stable with nonspecific mild prominence of the interstitial lung markings without focal acute findings.   HISTORY OF PRESENT ILLNESS: This 74 year old man with history of coronary artery disease, sleep apnea, presented to the Emergency Room with  shortness of breath. He had just been discharged from the hospital 2 days prior after hospitalization for acute bronchitis. At that time, he was sent home on 2 liters of oxygen via nasal cannula. After discharge, the patient continued to have cough with greenish sputum and his oxygen saturation decreased to 69% even on 4 liters of oxygen prior to admission. He came to the Emergency Room for further evaluation. He had been compliant with prednisone and Zithromax since his last discharge.  His hospital course by problem:  1.  Acute respiratory failure with hypoxia: This is likely multifactorial and due to diastolic heart failure, acute bronchitis, as well as probable COPD with an extensive 30 pack-year smoking history. He was treated with IV Levaquin, which was transitioned to oral Levaquin upon discharge. He was provided nebulizer treatments and continued on his prednisone taper. He remained on oxygen via nasal cannula during the hospitalization, which was tapered to 2 liters at the time of discharge and will need to be followed by his primary care provider.  2.  Diastolic heart failure with preserved ejection fraction: The patient did not receive Lasix during the hospitalization, but was placed on a low-sodium diet and his weight decreased considerably from 221 pounds to 219 pounds over this short admission. On presentation he did have some mild pedal edema which was gone by the time of discharge. He likely had a small exacerbation of diastolic heart failure. He had elevated right-sided pressures and pulmonary hypertension likely due to pulmonary processes. He was followed by Dr. Clayborn Bigness throughout the hospitalization.  3.  Likely COPD: The patient was seen by Dr. [name] in pulmonology. Due to his lengthy history of smoking, he  most likely has COPD. He will need PFTs in an outpatient setting. He was treated with prednisone, nebulizers, and antibiotics per protocol. He was discharged on Spiriva and fluticasone  salmeterol.  4.  Acute bronchitis: The patient had recently been discharged from the hospital and had started treatment for acute bronchitis. He now continues on levofloxacin.  5.  Diabetes mellitus type 2 with hemoglobin A1c of 10.8: The patient is on a difficult outpatient regimen for insulin. He was maintained in hospital on sliding scale and long-acting insulin. He was discharged on his previous regimen, but with instructions for following up with his primary care provider to discuss this. He also has a referral for outpatient diabetes management.  6.  Obstructive sleep apnea on CPAP: The patient was noted to have much improved symptoms of sleep apnea after starting nasal cannula oxygen. He did bring his CPAP machine into the hospital and was able to use it, as well. He is compliant with his regimen.  7.  Coronary artery disease, status post CABG: Initially, the patient did have some elevated troponins. He was evaluated by cardiology and this was not thought to be ACS. At the time of discharge, he is chest pain-free.   DISCHARGE PHYSICAL EXAMINATION:  VITAL SIGNS: Temperature 98.3, pulse 79, respirations 20, blood pressure 163/70, oxygenation 98% on 2 liters.  GENERAL: No acute distress.  CARDIOVASCULAR: Regular rate and rhythm. No murmurs, rubs, or gallops. No peripheral edema. Peripheral pulses are 2+.  RESPIRATORY: Lungs clear to auscultation bilaterally with good air movement.  ABDOMEN: Soft, nontender, nondistended. Bowel sounds normal. No guarding, no rebound.  PSYCHIATRIC: The patient alert and oriented x 4 with good insight into his clinical conditions.   LABORATORY DATA: Sodium 137, potassium 4.2, chloride 97, bicarbonate 36, BUN 40, creatinine 1.62, glucose 178, white blood cells 5.7, hemoglobin 11.1, platelets 197,000. MCV is 82.   CONDITION ON DISCHARGE: Stable.   DISPOSITION: The patient is being discharged to home with 2 liters of nasal cannula oxygen. No home health needs.    DISCHARGE INSTRUCTIONS:   DIET: Low-sodium, low-fat, carbohydrate -controlled diet.   ACTIVITY: No restrictions.   TIMEFRAME FOR FOLLOWUP: Follow up in 1-2 weeks with Dr. Nehemiah Massed, Dr. Stevenson Clinch, and Dr. Lovie Macadamia.   TIME SPENT ON DISCHARGE: 45 minutes.   ____________________________ Earleen Newport. Volanda Napoleon, MD cpw:am D: 07/01/2014 18:59:29 ET T: 07/02/2014 04:24:53 ET JOB#: [id number]  cc: Barnetta Chapel P. Volanda Napoleon, MD, <Dictator> Youlanda Roys. Lovie Macadamia, MD Vilinda Boehringer, MD Corey Skains, MD   Aldean Jewett MD ELECTRONICALLY SIGNED 07/09/2014 23:20

## 2014-09-20 NOTE — Consult Note (Signed)
Chief Complaint:  Subjective/Chief Complaint Still sob weak and fatigued . Denies f/c/s for now.   VITAL SIGNS/ANCILLARY NOTES: **Vital Signs.:   03-Feb-16 11:09  Vital Signs Type Routine  Temperature Temperature (F) 98.6  Celsius 37  Temperature Source oral  Respirations Respirations 20  Systolic BP Systolic BP 324  Diastolic BP (mmHg) Diastolic BP (mmHg) 77  Mean BP 106  Pulse Ox % Pulse Ox % 93  Pulse Ox Activity Level  At rest  Oxygen Delivery 2L  *Intake and Output.:   Daily 03-Feb-16 07:00  Grand Totals Intake:  870 Output:  1550    Net:  -680 24 Hr.:  -680  Oral Intake      In:  720  IV (Primary)      In:  150  Urine ml     Out:  1550  Length of Stay Totals Intake:  1260 Output:  1550    Net:  -290   Brief Assessment:  GEN well developed, well nourished, no acute distress   Cardiac Regular   Respiratory normal resp effort  no use of accessory muscles  rhonchi   Gastrointestinal Normal   Gastrointestinal details normal Soft   EXTR negative cyanosis/clubbing, negative edema   Lab Results: Routine Chem:  03-Feb-16 03:34   Glucose, Serum  288  BUN  44  Creatinine (comp)  1.75  Sodium, Serum 139  Potassium, Serum 4.3  Chloride, Serum 99  CO2, Serum  35  Calcium (Total), Serum 8.8  Anion Gap  5  Osmolality (calc) 299  eGFR (African American)  50  eGFR (Non-African American)  41 (eGFR values <54m/min/1.73 m2 may be an indication of chronic kidney disease (CKD). Calculated eGFR, using the MRDR Study equation, is useful in  patients with stable renal function. The eGFR calculation will not be reliable in acutely ill patients when serum creatinine is changing rapidly. It is not useful in patients on dialysis. The eGFR calculation may not be applicable to patients at the low and high extremes of body sizes, pregnant women, and vegetarians.)  Routine Hem:  03-Feb-16 03:34   WBC (CBC) 5.7  RBC (CBC)  4.27  Hemoglobin (CBC)  11.1  Hematocrit (CBC)   34.9  Platelet Count (CBC) 197  MCV 82  MCH 26.1  MCHC  31.9  RDW  17.1  Neutrophil % 67.7  Lymphocyte % 15.9  Monocyte % 16.0  Eosinophil % 0.1  Basophil % 0.3  Neutrophil # 3.9  Lymphocyte #  0.9  Monocyte # 0.9  Eosinophil # 0.0  Basophil # 0.0 (Result(s) reported on 24 Jun 2014 at 04:45AM.)   Radiology Results: XRay:    01-Feb-16 11:03, Chest PA and Lateral  Chest PA and Lateral   REASON FOR EXAM:    Shortness of Breath  COMMENTS:   May transport without cardiac monitor    PROCEDURE: DXR - DXR CHEST PA (OR AP) AND LATERAL  - Jun 22 2014 11:03AM     CLINICAL DATA:  Shortness of breath, dizziness, hypoxia. History of  coronary artery disease    EXAM:  CHEST  2 VIEW    COMPARISON:  06/19/2014    FINDINGS:  Chronic right pleural thickening associated with remote right  lateral rib fractures reidentified. Evidence of CABG is noted. Heart  size is normal. Diffusely prominent interstitial reticular opacities  are noted. There is no new focal opacity or significant interval  change. No pleural effusion. No acute osseous finding.     IMPRESSION:  Stable nonspecific mild prominence of the interstitial lung markings  without focal acute finding.      Electronically Signed    By: Conchita Paris M.D.    On: 06/22/2014 11:37       Verified By: Arline Asp, M.D.,  Cardiology:    01-Feb-16 10:33, ECG  Ventricular Rate 69  Atrial Rate 69  P-R Interval 216  QRS Duration 120  QT 414  QTc 443  P Axis 38  R Axis -49  T Axis 99  ECG interpretation   Sinus rhythm with 1st degree A-V block  Left axis deviation  Anteroseptal infarct (cited on or before 27-Jul-2008)  Abnormal ECG  When compared with ECG of 19-Jun-2014 13:44,  No significant change was found  ----------unconfirmed----------  Confirmed by OVERREAD, NOT ([id number]), editor PEARSON, BARBARA ([id number]) on 06/22/2014 2:14:14 PM  ECG     02-Feb-16 17:15, Echo Doppler  Echo Doppler   REASON FOR EXAM:       COMMENTS:       PROCEDURE: [facility] - ECHO DOPPLER COMPLETE(TRANSTHOR)  - Jun 23 2014  5:15PM     RESULT: Echocardiogram Report    Patient Name:   Ernest Mclean Date of Exam: 06/23/2014  Medical Rec #:  [id number]       Custom1:  Date of Birth:  02/09/1941   Height:       72.0 in  Patient Age:    74 years     Weight:       221.0 lb  Patient Gender: M            BSA:          2.22 m??    Indications: SOB  Sonographer:    Arville Go RDCS  Referring Phys: Demetrios Loll    Sonographer Comments: Technically difficult study due to poor echo   windows.    Summary:   1. Left ventricular ejection fraction, by visual estimation, is 55 to   60%.   2. Normal global left ventricular systolic function.   3. Mildly increased left ventricular septal thickness.   4. Mildly dilated right atrium.   5. Mildly elevated pulmonary artery systolic pressure.   6. Moderately increased left ventricular posterior wall thickness.   7. Mild tricuspid regurgitation.  2D AND M-MODE MEASUREMENTS (normal rangeswithin parentheses):  Left Ventricle:          Normal  IVSd (2D):      1.54 cm (0.7-1.1)  LVPWd (2D):     1.32 cm (0.7-1.1) Aorta/LA:                  Normal  LVIDd (2D):     4.67 cm (3.4-5.7) Aortic Root (2D): 3.50 cm (2.4-3.7)  LVIDs (2D):     3.16cm           Left Atrium (2D): 3.50 cm (1.9-4.0)  LV FS (2D):     32.3 %   (>25%)  LV EF (2D):     60.6 %   (>50%)                                    Right Ventricle:                                    RVd (2D):  LV DIASTOLIC FUNCTION:  MV  Peak E: 1.27 m/s Decel Time: 227 msec  MV Peak A: 0.97 m/s  E/A Ratio: 1.31  SPECTRAL DOPPLER ANALYSIS (where applicable):  Mitral Valve:  MV P1/2 Time: 65.83 msec  MV Area, PHT: 3.34 cm??  Aortic Valve: AoV Max Vel: 1.18 m/s AoV Peak PG: 5.6 mmHg AoV Mean PG:  LVOT Vmax: 1.02 m/s LVOT VTI:  LVOT Diameter: 2.30 cm  AoV Area, Vmax: 3.58 cm?? AoV Area, VTI:  AoV Area, Vmn:  Tricuspid Valve and PA/RV Systolic Pressure: TR  Max Velocity: 2.80 m/s RA   Pressure: 10 mmHg RVSP/PASP: 41.2 mmHg  Pulmonic Valve:  PV MaxVelocity: 1.48 m/s PV Max PG: 8.8 mmHg PV Mean PG:    PHYSICIAN INTERPRETATION:  Left Ventricle: The left ventricular internal cavity size was normal. LV   septal wall thickness was mildly increased. LV posterior wall thickness   was moderately increased. Global LV systolic function was normal. Left   ventricular ejection fraction, by visual estimation, is 55 to 60%.  Right Ventricle: The right ventricular size is mildly enlarged. Global RV   systolic function is low normal.  Left Atrium: The left atrium is normal in size.  Right Atrium: The right atrium is mildly dilated.  Pericardium: There is no evidence of pericardial effusion.  Mitral Valve: The mitral valve is normal in structure. Trace mitral valve   regurgitation is seen.  Tricuspid Valve: The tricuspid valve is normal. Mild tricuspid   regurgitation is visualized. The tricuspid regurgitant velocity is 2.80   m/s, and with an assumed right atrial pressure of 10 mmHg, the estimated   right ventricular systolic pressure is mildly elevated at 41.2 mmHg.  Aortic Valve: The aortic valve is normal. No evidence of aortic valve   regurgitation is seen.  Pulmonic Valve: The pulmonic valve is normal. No indication of pulmonic   valve regurgitation.  [place] MD  Electronically signed by Ortley Lujean Amel MD  Signature Date/Time: 06/24/2014/9:31:05 AM    *** Final ***    IMPRESSION: .        Verified By: Yolonda Kida, M.D., MD   Assessment/Plan:  Assessment/Plan:  Assessment Persistent cough multi focal pneumonia  COPD  bronchitis with congestion  coronary artery disease  congestive heart failure  GERD  obstructive sleep apnea  elevated troponins  hypoxemia  coronary artery disease  chronic renal insufficiency  diabetes  obesity .   Plan Echo with good LVF mild right sided increased pressures continue  broad-spectrum antibiotics including Levaquin therapy for possible pneumonia  supplemental oxygen therapy for hypoxemia  continue diuretic therapy for possible heart failure  agree with reflux therapy including omeprazole  continue hypertension control  continue diabetes medication including Humalog insulin  obstructive sleep apnea continue CPAP therapy  continue telemetry for mildly elevated troponins  suggest demand ischemia  continue steroid taper Agree with pulmonary consult and input   Electronic Signatures: Yolonda Kida (MD)  (Signed 03-Feb-16 17:41)  Authored: Chief Complaint, VITAL SIGNS/ANCILLARY NOTES, Brief Assessment, Lab Results, Radiology Results, Assessment/Plan   Last Updated: 03-Feb-16 17:41 by Lujean Amel D (MD)

## 2014-09-20 NOTE — Consult Note (Signed)
PATIENT NAME:  Ernest Mclean, Ernest Mclean MR#:  [id number] DATE OF BIRTH:  1940-09-22  DATE OF CONSULTATION:  07/14/2014  CONSULTING PHYSICIAN:  Corey Skains, MD  REQUESTING PHYSICIAN: Loletha Grayer, MD  REASON FOR CONSULTATION: Atrial fibrillation with rapid ventricular rate with elevated troponin and known coronary artery disease status post coronary artery bypass graft.   CHIEF COMPLAINT: "I am short of breath and irregular heartbeat."   HISTORY OF PRESENT ILLNESS: This is a 74 year old male with known coronary artery disease status post coronary artery bypass graft with no evidence of significant new myocardial infarction and/or evidence of significant congestive heart failure who has had significant waxing and waning of blood pressure over the last week. His blood pressure has been quite low and labile. The patient has had appropriate medication for previous hypertension although he may need some adjustments. In addition to that, he has had some palpitations, irregular heartbeat with atrial fibrillation with rapid ventricular rate also contributing to above now requiring additional medication management for heart rate control with an EKG now showing atrial fibrillation with nonspecific ST and T wave changes. He did have an elevation of troponin of 0.13 most consistent with demand ischemia rather than acute myocardial infarction and also exacerbation of chronic obstructive pulmonary disease recently also driving his issues. He has diabetes with chronic kidney disease stage 3 which has been relatively stable.   REVIEW OF SYSTEMS: The remainder of the review of systems is negative for vision change, ringing in the ears, hearing loss, heartburn, nausea, vomiting, diarrhea, bloody stools, stomach pain, extremity pain, leg weakness, cramping in buttocks, known blood clots, headaches, blackouts, nosebleeds, congestion, trouble swallowing, frequent urination, urination at night, muscle weakness, numbness,  anxiety, depression, skin lesions or skin rashes.   PAST MEDICAL HISTORY:  1.  Chronic kidney disease stage 3.  2.  Chronic obstructive pulmonary disease.  3.  Coronary artery bypass graft. 4.  Diabetes with complication.   FAMILY HISTORY: Multiple family members with early onset of cardiovascular disease and hypertension.   SOCIAL HISTORY: Currently denies alcohol or tobacco use.   ALLERGIES: As listed.   MEDICATIONS: As listed.   PHYSICAL EXAMINATION:  VITAL SIGNS: Blood pressure is 100/66 bilaterally, heart rate is 100 upright, reclining, and irregular.  GENERAL: He is a well-appearing male in no acute distress.  HEENT: No icterus, thyromegaly, ulcers, hemorrhage, or xanthelasma.  CARDIOVASCULAR: Irregularly irregular. Normal S1 and S2 with an inferior PMI and 2-3+ mitral regurgitation. Carotid upstroke normal without bruit. Jugular venous pressure not seen.  LUNGS: Have expiratory wheezes and no evidence of rales.  ABDOMEN: Soft, nontender. No hepatosplenomegaly or masses. Abdominal aorta not felt or seen or heard.  EXTREMITIES: 2+ radial, trace femoral and dorsal pedal pulses. Trace lower extremity edema. No cyanosis, clubbing or ulcers.  NEUROLOGIC: He is oriented to time, place, and person, with normal mood and affect.   ASSESSMENT: A 74 year old male with new onset nonvalvular atrial fibrillation with rapid ventricular rate, chronic kidney disease stage 3, elevated troponin consistent with demand ischemia, chronic obstructive pulmonary disease, coronary artery bypass graft without angina, and diabetes with complication, needing further treatment options.   RECOMMENDATIONS:  1.  Heart rate control with beta blocker for further risk reduction in cardiovascular event and issues with significant waxing and waning blood pressure.  2.  Discontinuation of calcium channel blocker including amlodipine if necessary due to concerns of labile blood pressure and being too low.  3.   Anticoagulation for further risk reduction in stroke with  atrial fibrillation if not contraindicated and would consider adjustment of dose depending on chronic kidney disease extent. 4.  No further intervention of elevated troponin, most consistent with demand ischemia without evidence of true angina, heart failure and/or myocardial infarction.  5.  Diabetes control with appropriate intervention as before.    ___________________________ Corey Skains, MD bjk:mc D: 07/14/2014 13:38:04 ET T: 07/14/2014 14:11:54 ET JOB#: [id number]  cc: Corey Skains, MD, <Dictator> Corey Skains MD ELECTRONICALLY SIGNED 07/17/2014 8:31

## 2014-09-20 NOTE — Consult Note (Signed)
Chief Complaint:  Subjective/Chief Complaint The patient had a bad evening with significant cough congestion also being make it difficult to sleep denies any chest pain still has shortness of breath but feels better this morning.   VITAL SIGNS/ANCILLARY NOTES: **Vital Signs.:   02-Feb-16 08:43  Vital Signs Type Pre Medication  Pulse Pulse 77  Systolic BP Systolic BP 825  Diastolic BP (mmHg) Diastolic BP (mmHg) 78  Mean BP 108  Pulse Ox % Pulse Ox % 93  Pulse Ox Activity Level  At rest  Oxygen Delivery 2L    11:48  Vital Signs Type Routine  Temperature Temperature (F) 98.1  Celsius 36.7  Temperature Source oral  Pulse Pulse 69  Respirations Respirations 21  Systolic BP Systolic BP 053  Diastolic BP (mmHg) Diastolic BP (mmHg) 65  Mean BP 97  Pulse Ox % Pulse Ox % 93  Pulse Ox Activity Level  At rest  Oxygen Delivery 2L  *Intake and Output.:   02-Feb-16 11:20  Grand Totals Intake:  240 Output:      Net:  240 24 Hr.:  480  Oral Intake      In:  240  Percentage of Meal Eaten  75   Brief Assessment:  GEN well developed, well nourished, no acute distress   Cardiac Regular   Respiratory normal resp effort  no use of accessory muscles  rhonchi   Gastrointestinal Normal   Gastrointestinal details normal Soft   EXTR negative cyanosis/clubbing, negative edema   Lab Results: Routine Chem:  02-Feb-16 05:22   Glucose, Serum  225  BUN  38  Creatinine (comp)  1.63  Sodium, Serum 139  Potassium, Serum 4.4  Chloride, Serum 100  CO2, Serum 32  Calcium (Total), Serum 8.8  Anion Gap 7  Osmolality (calc) 294  eGFR (African American)  54  eGFR (Non-African American)  44 (eGFR values <71m/min/1.73 m2 may be an indication of chronic kidney disease (CKD). Calculated eGFR, using the MRDR Study equation, is useful in  patients with stable renal function. The eGFR calculation will not be reliable in acutely ill patients when serum creatinine is changing rapidly. It is not  useful in patients on dialysis. The eGFR calculation may not be applicable to patients at the low and high extremes of body sizes, pregnant women, and vegetarians.)  Magnesium, Serum 2.1 (1.8-2.4 THERAPEUTIC RANGE: 4-7 mg/dL TOXIC: > 10 mg/dL  -----------------------)  Hemoglobin A1c ([facility])  10.8 (The American Diabetes Association recommends that a primary goal of therapy should be <7% and that physicians should reevaluate the treatment regimen in patients with HbA1c values consistently >8%.)  Routine Hem:  02-Feb-16 05:22   WBC (CBC) 5.5  RBC (CBC) 4.43  Hemoglobin (CBC)  11.6  Hematocrit (CBC)  36.6  Platelet Count (CBC) 185  MCV 83  MCH 26.1  MCHC  31.6  RDW  17.6  Neutrophil % 79.3  Lymphocyte % 10.0  Monocyte % 10.4  Eosinophil % 0.0  Basophil % 0.3  Neutrophil # 4.3  Lymphocyte #  0.5  Monocyte # 0.6  Eosinophil # 0.0  Basophil # 0.0 (Result(s) reported on 23 Jun 2014 at 05:49AM.)   Radiology Results: XRay:    01-Feb-16 11:03, Chest PA and Lateral  Chest PA and Lateral   REASON FOR EXAM:    Shortness of Breath  COMMENTS:   May transport without cardiac monitor    PROCEDURE: DXR - DXR CHEST PA (OR AP) AND LATERAL  - Jun 22 2014 11:03AM  CLINICAL DATA:  Shortness of breath, dizziness, hypoxia. History of  coronary artery disease    EXAM:  CHEST  2 VIEW    COMPARISON:  06/19/2014    FINDINGS:  Chronic right pleural thickening associated with remote right  lateral rib fractures reidentified. Evidence of CABG is noted. Heart  size is normal. Diffusely prominent interstitial reticular opacities  are noted. There is no new focal opacity or significant interval  change. No pleural effusion. No acute osseous finding.     IMPRESSION:  Stable nonspecific mild prominence of the interstitial lung markings  without focal acute finding.      Electronically Signed    By: Conchita Paris M.D.    On: 06/22/2014 11:37       Verified By: Arline Asp,  M.D.,  Cardiology:    01-Feb-16 10:33, ECG  Ventricular Rate 69  Atrial Rate 69  P-R Interval 216  QRS Duration 120  QT 414  QTc 443  P Axis 38  R Axis -35  T Axis 99  ECG interpretation   Sinus rhythm with 1st degree A-V block  Left axis deviation  Anteroseptal infarct (cited on or before 27-Jul-2008)  Abnormal ECG  When compared with ECG of 19-Jun-2014 13:44,  No significant change was found  ----------unconfirmed----------  Confirmed by OVERREAD, NOT ([id number]), editor PEARSON, BARBARA ([id number]) on 06/22/2014 2:14:14 PM  ECG    Assessment/Plan:  Assessment/Plan:  Assessment multi focal pneumonia  COPD  bronchitis with congestion  coronary artery disease  congestive heart failure  GERD  obstructive sleep apnea  elevated troponins  hypoxemia  coronary artery disease  chronic renal insufficiency  diabetes  obesity .   Plan continue broad-spectrum antibiotics including Levaquin therapy for possible pneumonia  supplemental oxygen therapy for hypoxemia  continue diuretic therapy for possible heart failure  agree with reflux therapy including omeprazole  continue hypertension control  continue diabetes medication including Humalog insulin  obstructive sleep apnea continue CPAP therapy  continue telemetry for mildly elevated troponins  suggest demand ischemia  continue steroid taper   recommend pulmonary consult and input   Electronic Signatures: Yolonda Kida (MD)  (Signed 02-Feb-16 17:46)  Authored: Chief Complaint, VITAL SIGNS/ANCILLARY NOTES, Brief Assessment, Lab Results, Radiology Results, Assessment/Plan   Last Updated: 02-Feb-16 17:46 by Yolonda Kida (MD)

## 2014-09-20 NOTE — H&P (Signed)
PATIENT NAME:  Ernest Mclean, Ernest Mclean MR#:  [id number] DATE OF BIRTH:  1940/11/30  DATE OF ADMISSION:  07/13/2014  REFERRING EMERGENCY ROOM PHYSICIAN: Doran Stabler, MD  PRIMARY CARE PHYSICIAN:  Juluis Pitch, MD   PRIMARY CARDIOLOGIST: Serafina Royals, MD   CHIEF COMPLAINT: Weakness.   HISTORY OF PRESENT ILLNESS: This very pleasant 74 year old man who was recently discharged from [facility] after admission for respiratory failure attributed to pneumonia, chronic obstructive pulmonary disease, diastolic heart failure presents today with weakness for the past week. She states that initially after his discharge on 02/05, he felt well until approximately 07/07/2014.  At that time, for reasons unknown, he began feeling extremely weak, and noting that his blood pressure was low particularly after taking his morning medications.  His wife carries with her a log of blood pressures and blood sugars and on 07/07/2014 notes a blood pressure 80s/60s.  His heart rate has also progressively increased over this time.  He has had multiple readings as low as that over the past few days with readings only as high as 120/80. Four days ago he developed diarrhea, having 3 to 4 very large watery bowel movements per day. He started vomiting last night and vomited throughout the night. No hematemesis, melena, or hematochezia. His wife has been trying to encourage fluid intake and states that he has actually gained 4 pounds over the past few days.  He has not taken any medications since yesterday morning. At the time of presentation to the Emergency Room, he is in afib with RVR at 130-140, hypotensive, with blood pressures in the 90s over 70s and very weak. Blood sugars also elevated at 425.   PAST MEDICAL HISTORY:  1.  Recent admission to [facility] with discharge on 06/26/2014 for respiratory failure attributed to pneumonia, chronic obstructive pulmonary disease, diastolic dysfunction.  2.  Coronary artery disease  status post CABG 3.  Obstructive sleep apnea on CPAP.  4.  Hypertension.  5.  Diabetes mellitus type 2, uncontrolled, last hemoglobin A1c 10.2.  6.  Anxiety and depression.  7.  Peripheral vascular disease.  8.  Gastroesophageal reflux disease.  9.  Likely chronic obstructive pulmonary disease  (no pft's, long smoking history)  PAST SURGICAL HISTORY:  1.  Coronary artery bypass grafting. 2.  Hernia repair. 3.  Cataracts. 4.  Collarbone surgery.    SOCIAL HISTORY: The patient lives with his wife. He was discharged last on 2 liters of oxygen via nasal cannula. He is a former smoker. No longer smoking cigarettes. No alcohol or illicit substances.    ALLERGIES:  PENICILLIN, CODEINE AND VICODIN.   HOME MEDICATIONS:  1.  Bactrim 800 mg-160 mg 1 tablet twice a day.  2.  Spiriva 18 mcg per inhalation 1 capsule inhaled daily.  3.  Pravastatin 20 mg 1 tablet once a day at bedtime.  4.  Omeprazole 20 mg 1 capsule once a day.  5.  Metoprolol 100 mg 1 tablet twice a day.  6.  Metformin 1000 mg 1 tablet twice a day.  7.  Isosorbide mononitrate 60 mg extended release 1 tablet once a day.  8.  Hydrochlorothiazide 12.5 mg-20 mg 2 tablets twice a day.  9.  Humulin-N 40 units in the morning and 54 units in the evening.  10. Humalog 40 units in the evening and 30 units in the morning.  11. Diphenhydramine 25 mg 1 tablet every 6 hours as needed for allergies.  12. Diazepam 5 mg 1 tablet every 12  hours.  13. Citalopram 20 mg 1 tablet once a day.  14. Aspirin 325 mg 1 tablet once a day.  15. Amlodipine 10 mg 1 tablet once a day.  16. Advair Diskus 250 mcg/50 mcg 1 puff inhaled 2 times a day   REVIEW OF SYSTEMS:  CONSTITUTIONAL: Positive for fatigue, weakness. No pain. Positive for initial weight loss after hospitalization with weight gain of about 4 pounds over the past few days, no fevers or chills.  HEENT: No change in vision or hearing. No pain in eyes or ears. No sinus congestion, sore throat or  difficulty swallowing.  RESPIRATORY: No shortness of breath, wheezing, hemoptysis, or painful respirations.  CARDIOVASCULAR: No chest pain, orthopnea, edema. Positive for palpitations and presyncope. No syncope.  GASTROINTESTINAL:  Positive for nausea, vomiting, and diarrhea. No abdominal pain. No hematemesis or melena, positive for history of gastroesophageal reflux disease.  GENITOURINARY: No dysuria or frequency.  ENDOCRINE: Positive for polyuria, polydipsia, negative for hot or cold intolerance.  HEMATOLOGIC: No easy bruising or bleeding.  SKIN: No new rashes or lesions.  MUSCULOSKELETAL: No new pain in the neck, back, shoulders, knees, or hips. No swollen tender joints. No gout.  NEUROLOGIC: No focal numbness, positive for diffuse weakness. No dysarthria, confusion, seizure or memory loss.  PSYCHIATRIC: Has a history of anxiety and depression. No signs of uncontrolled symptoms. No bipolar disorder or schizophrenia.   PHYSICAL EXAMINATION:  VITAL SIGNS: Temperature 98.1, pulse 128, respirations 18, blood pressure 107/80, oxygenation 98% on 2 liters.  GENERAL: No acute distress. Looks fatigued.  HEENT: Pupils equal, round, and reactive to light. Conjunctivae are clear. Extraocular motion intact. Oral mucous membranes are pink and moist. Posterior oropharynx is clear with no exudate, edema, or erythema. Good dentition.  NECK: Supple, trachea midline. No cervical lymphadenopathy.  RESPIRATORY: Lungs clear to auscultation bilaterally with good air movement.  CARDIOVASCULAR: Tachycardic, irregular, no murmurs, rubs, or gallops. No peripheral edema. Peripheral pulses are 1+.  ABDOMEN: Distended, obese, soft, nontender bowel sounds are normal. No guarding, no rebound, no hepatosplenomegaly noted.  SKIN: No rashes, lesions or wounds.  MUSCULOSKELETAL: No joint effusions. Range of motion normal. Strength is 5/5 throughout.  NEUROLOGIC: Cranial nerves II through XII grossly intact. Strength and  sensation are intact and equal bilaterally, nonfocal.  PSYCHIATRIC: The patient alert and oriented x 4, good insight into his clinical condition. No uncontrolled depression or anxiety.   LABORATORY DATA: Sodium 129, potassium 5.1, chloride 97, bicarbonate 27, BUN 38, creatinine 2.65, glucose 425. Troponin less than 0.02. White blood cells 8.3, hemoglobin 11.9, platelets 177,000, MCV is 83.   IMAGING: No imaging performed at this time.  I have ordered a chest x-ray and a KUB.   ASSESSMENT AND PLAN:  1.  Hypotension: I believe that this is multifactorial. During last admission isosorbide mononitrate dose was increased. He is also on excessive hydrochlorothiazide and lisinopril as an outpatient. Since his discharge he has been very vigilant at fluid restriction and sodium restriction. Blood sugars have been uncontrolled likely causing auto diuresis. I think that over the past 4 days of diarrhea, this tipped the scales into being frankly hypotensive due to volume depletion and over medication.  Giving back, fluids currently, he has received 1 liter in the Emergency Room and will continue to receive IV hydration overnight.  2.  Diarrhea: Check Clostridium difficile and stool cultures. We will start ciprofloxacin and Flagyl empirically and they should be stopped if culture is negative.     3.  a-  fib with RVR: New a-fib, not seen on prior hospitalization. With fluid, his heart rate is already starting to decrease. We will cycle cardiac enzymes. Continue metoprolol as blood pressures allow. Cardiology consultation. 4.  Acute renal failure: Likely due to hypotension, acute tubular necrosis, and volume depletion. We will continue with IV fluids. Hold metformin and lisinopril. Continue to monitor.  5.  Diabetes mellitus type 2, uncontrolled. At last hospitalization, his hemoglobin A1c was 10.2. He also had been prolonged steroids during the last hospitalization but is no longer on oral steroids. They report that  at home, his CBGs have been greater than 300 most of the time. This will improve somewhat with fluids. I think that his home insulin regimen is difficult to adjust.  I have consulted inpatient diabetes management for recommendations on changing his home insulin regimen. He may benefit from one of the concentrated long-acting insulins with sliding scale coverage.  6.  Possible diastolic heart failure and pulmonary hypertension. This was discussed during his last admission and prompted the recommendation for decreased sodium diet and fluid restriction. He is not having an exacerbation at this time. We will consult cardiology for assistance with management of atrial fibrillation with RVR in the setting of hypotension and also for discussion of medication management going forward in this patient  7.  Prophylaxis: Heparin for deep vein thrombosis prophylaxis. He does have gastroesophageal reflux disease, so we will continue with proton pump inhibitor for gastrointestinal prophylaxis.  8.  Chronic obstructive pulmonary disease: Continue with Spiriva and Advair.  9.  Depression and anxiety. I will continue with citalopram and diazepam as needed for anxiety.    TIME SPENT ON ADMISSION: 50 minutes.      ____________________________ Earleen Newport. Volanda Napoleon, MD cpw:DT D: 07/13/2014 15:35:09 ET T: 07/13/2014 16:07:59 ET JOB#: [id number]  cc: Barnetta Chapel P. Volanda Napoleon, MD, <Dictator> Aldean Jewett MD ELECTRONICALLY SIGNED 07/14/2014 8:19

## 2014-09-20 NOTE — Consult Note (Signed)
PATIENT NAME:  Ernest Mclean, Ernest Mclean MR#:  [id number] DATE OF BIRTH:  07/09/1940  DATE OF CONSULTATION:  07/17/2014  REFERRING PHYSICIAN:     Gladstone Lighter, MD CONSULTING PHYSICIAN:  A. Lavone Orn, MD  ENDOCRINOLOGY CONSULTATION  CHIEF COMPLAINT: Uncontrolled diabetes.   HISTORY OF PRESENT ILLNESS: This is a 74 year old male who was hospitalized on 07/13/2014 with atrial fibrillation with rapid ventricular rate and uncontrolled diabetes. Initial blood sugar was 425. He had hospitalization several weeks prior for respiratory failure attributed to pneumonia, COPD, and congestive heart failure exacerbation. At the time of that hospital discharge, it had been recommended that he resume his home insulin regimen of Humulin N 40 units in the morning and 54 units in the evening, Humalog 30 units before breakfast and 40 units before supper, and metformin 1000 mg twice daily. He had not been discharged on steroids. The patient recalls that prior to admission blood sugars had been variable in the 200 to 400 range. States there has been no significant change in his diet or activity. Wife was also interviewed at the bedside. She helps him manage medications and manage his diet. She states they try to limit his carbohydrate to 5 servings of carbs (75 grams) at each meal. His current regimen is Lantus 50 units at bedtime, NovoLog 10 units 3 times daily, and a NovoLog insulin sliding scale of 3 units if blood sugar 200 to 250 plus an additional 2 units per 50 mg/dL over a target of 251. Blood sugars over the last 24 hours have been in the 107 to 315 range. The patient states he feels fairly well and intends to go home today. He is eating 100% of his meal trays. Denies nausea.   The patient has had type 2 diabetes for 30 years. He has been on insulin for the duration of the disease. Denies recent changes in the dose of the insulin prior to this hospitalization. Diabetes is complicated by microalbuminuria and renal  insufficiency. Last hemoglobin A1c was 9.9% on 06/08/2014.   PAST MEDICAL HISTORY:  1.  Diabetes mellitus.  2.  Diabetic nephropathy.  3.  Microalbuminuria.  4.  Stage III chronic kidney disease.  5.  ASCVD.  6.  Peripheral arterial disease.  7.  Hypertension.  8.  Hyperlipidemia.  9.  Peripheral vascular disease.  10.  GERD.  11.  Obstructive sleep apnea.  12.  Anxiety/depressive disorder.   PAST SURGICAL HISTORY:  1.  CABG.  2.  Hernia repair.  3.  Cataract repair.   SOCIAL HISTORY: The patient is married. Former smoker, quit in 2003. No alcohol use.   ALLERGIES: CODEINE, HYDRALAZINE, PENICILLINS.   FAMILY HISTORY: Positive for type 2 diabetes, breast cancer, colon cancer, and stroke.   REVIEW OF SYSTEMS:  GENERAL: No weight loss. No fever.  HEENT: No headache. No sore throat.  NECK: No neck pain. No dysphagia.  CARDIAC: No chest pain. No palpitations.  PULMONARY: No cough. No shortness of breath.  ABDOMEN: No abdominal pain. Good appetite. Denies nausea.  EXTREMITIES: Denies lower extremity swelling at this time.  SKIN: Denies rash or recent skin changes  ENDOCRINE: Denies heat or cold intolerance.  GENITOURINARY: Denies dysuria or hematuria.   PHYSICAL EXAMINATION:  VITAL SIGNS: Height 71.9 inches, weight 221 pounds. Temperature 98.4, pulse 73, respirations 18, BP 168/82, pulse oximetry 98% on 2 liters O2 by nasal cannula.  GENERAL: A white male in no acute distress.  HEENT: No proptosis. No lid lag. Oropharynx is clear. Mucous membranes  moist.  NECK: Supple. No appreciable thyromegaly.  CARDIAC: Regular rate and rhythm. No murmur.  PULMONARY: Clear to auscultation bilaterally. Good inspiratory effort. No wheeze.  ABDOMEN: No elicited tenderness. Positive bowel sounds.  EXTREMITIES: No peripheral edema is present.  LYMPHATIC: No submandibular or anterior cervical lymphadenopathy.  SKIN: No rash or dermatitis appreciated.  PSYCHIATRIC: Alert and oriented, calm,  cooperative.   LABORATORY DATA: Glucose 155, BUN 26, creatinine 1.51, sodium 141, potassium 4.5, EGFR 49. WBC 5.9.   ASSESSMENT:  1.  Type 2 diabetes with renal complications.  2.  Microalbuminuria.  3.  Stage III chronic kidney disease.  4.  Type 2 diabetes, uncontrolled.  5.  Long-term use of insulin.   RECOMMENDATIONS:  1.  Agree with current dose of basal insulin at 50 units at bedtime.  2.  Increase meal time insulin to 15 units. At discharge, he can change from NovoLog to Humalog as it is equivalent dosing and he already has a sufficient supply of this at home.  3.  Continue sliding scale insulin, again this will transition to Humalog at discharge.  4.  Encourage blood sugar monitoring before meals and at bedtime.  5.  Do not restart NPH insulin.  6.  Encouraged a low carb diet. Wife seems confident and capable of trying to keep him around 60 grams at meals and this is certainly reasonable.  7.  We will arrange an outpatient followup within 1 to 2 weeks.  8.  It would be helpful to repeat a hemoglobin A1c; however, this can be done as outpatient.   Thank you for the kind request for consultation.    ____________________________ A. Lavone Orn, MD ams:ts D: 07/17/2014 17:04:15 ET T: 07/17/2014 22:44:11 ET JOB#: [id number]  cc: A. Lavone Orn, MD, <Dictator> Sherlon Handing MD ELECTRONICALLY SIGNED 07/20/2014 8:43

## 2014-09-20 NOTE — Consult Note (Signed)
   Present Illness 74 yo male with history of copd, sleep apnea on cpap, history of hypertension and peripheral edema who was admitted with progressive sob and wheezing. He has history of angina, lower extremety claudicaiton. He is s/p cabg in 2003 as well as diabetes melitus. His serum tropoinin was minimally elevated at 0.11. EKG does not show any significant ischemia. CXR revealed no cardiopulmonary disease. He denis chest pain. Actiivty at home is limited by lower extremety claudication and sob. No chest pain. He is compliant with meds including amlodipine 10 mg daily, isosorbide mononitrate 30 mg daily, lisionpril-hydrochlorothiazide, metoprolol tartrate 100 mg bid and pravastatin for hyperlipidemia. His oxygen saturation was 88% on admision and has improved with tratement of bronchitis and oxygen therapy. When ambulating, his oxygen saturation reduces to below 88%. He denies current chest pain   Physical Exam:  GEN well nourished, no acute distress   HEENT PERRL   NECK No masses  thyroid not tender   RESP no use of accessory muscles   CARD Regular rate and rhythm   ABD denies tenderness   LYMPH negative neck, negative axillae   EXTR negative cyanosis/clubbing, negative edema   SKIN normal to palpation   NEURO cranial nerves intact, motor/sensory function intact   PSYCH A+O to time, place, person   Review of Systems:  Subjective/Chief Complaint sob   General: No Complaints   Skin: No Complaints   ENT: No Complaints   Eyes: No Complaints   Neck: No Complaints   Respiratory: Frequent cough  Short of breath   Cardiovascular: No Complaints   Gastrointestinal: No Complaints   Genitourinary: No Complaints   Vascular: Calf pain with walking  Leg cramping   Musculoskeletal: No Complaints   Neurologic: No Complaints   Hematologic: No Complaints   Endocrine: No Complaints   Psychiatric: No Complaints   Review of Systems: All other systems were reviewed and found  to be negative   Medications/Allergies Reviewed Medications/Allergies reviewed   Family & Social History:  Family and Social History:  Family History Non-Contributory   Place of Living Home   EKG:  EKG NSR   Abnormal NSSTTW changes    PCN: Unknown  Codeine: Unknown  Vicodin: Unknown   Impression 74 yo male with history of cad s/p cabg, history of diabetesmelitus, copd, sleep apnea on cpap hyperlipidemia and hypertension who was admitted with cough and sob with oxygen desaturation. He was diagnosed with bronchitis. His serum toponin was 0.11. No chest pain. No pulmonary edema on cxr. He has improved. Troponin elevation appears to be demand ischemia and not secondary to acs. He continues to desaturate when ambulating . Would continue amlodipine 10 mg daily, metoprolol 100 mg bid; pravastatin at current dose, imdur 30 mg daily, lisinopril-hctz.  Would consider dischargeing on oxygen therapy due to desatruation. Pulmonary evaluation as outpatinet appears indicated. NOt a candidate for invasvie cardiac evaluation. OK to dischrage to home from cardiac standpoint.   Plan 1. Continue with meds as discussed above. 2. Consider home oxygen 3. Continue cpap at night. 4. Follow up with Dr. Nehemiah Massed in 1 week after discharge 5. [facility] pulnary evaluation as out patient 6. OK for discharge from cardiac standpoint.   Electronic Signatures: Teodoro Spray (MD)  (Signed 30-Jan-16 16:30)  Authored: General Aspect/Present Illness, History and Physical Exam, Review of System, Family & Social History, EKG , Allergies, Impression/Plan   Last Updated: 30-Jan-16 16:30 by Teodoro Spray (MD)

## 2014-09-20 NOTE — Discharge Summary (Signed)
PATIENT NAME:  Ernest Mclean, LANGDON CROSSON MR#:  [id number] DATE OF BIRTH:  1940-12-27  DATE OF ADMISSION:  06/19/2014 DATE OF DISCHARGE:  06/20/2014  For a detailed note, please take a look at the history and physical done on admission by Dr. Loletha Grayer.   DIAGNOSES AT DISCHARGE: As follows: Asthmatic bronchitis. Elevated troponin, likely in the setting of demand ischemia. Hypertension. Diabetes. Hyperlipidemia. Gastroesophageal reflux disease. Obstructive sleep apnea.   DIET: The patient is being discharged on a low-sodium, low-fat, carbohydrate-controlled diet.   ACTIVITY: As tolerated.   FOLLOWUP: With Dr. Lovie Macadamia in the next 1-2 weeks.   DISCHARGE MEDICATIONS: Metformin 1000 mg b.i.d., metoprolol tartrate 100 mg b.i.d., Pravachol 20 mg daily, amlodipine 10 mg daily, aspirin 324 mg daily. Humalog 30 units in the morning and 40 units in the evening. Humulin-N 40 units in the morning, 54 units in the evening. Hydrochlorothiazide/ lisinopril 2 tablets b.i.d., omeprazole 20 mg daily, Imdur 30 mg daily, diphenhydramine 25 mg q. 6 hours as needed, diazepam 5 mg q. 12 hours as needed, Celexa 20 mg daily, prednisone taper starting at 50 mg down to 10 mg over the next 5 days, and Zithromax 500 mg daily x 3 days.   [place] COURSE: Javier Docker. Ubaldo Glassing, MD from cardiology.   PERTINENT STUDIES DONE DURING THE HOSPITAL COURSE: As follows: A chest x-ray done on admission showing no acute cardiopulmonary disease.   BRIEF HOSPITAL COURSE: This is a 74 year old male with medical problems as mentioned above, presented to the hospital with shortness of breath, cough and wheezing.   1.  Asthmatic bronchitis. This was likely the cause of the patient's shortness of breath, cough and wheezing. The patient was admitted to the hospital, started on IV steroids, also on Pulmicort nebulizers along with albuterol nebulizers,. After getting aggressive therapy overnight, the patient's wheezing and shortness of  breath significantly improved. He was ambulated on room air, did desaturate to 88%, therefore qualified for home oxygen. The patient's chest x-ray on admission was negative for pneumonia; therefore, he was treated for just some bronchitis with some Zithromax. Since he is clinically improved, he is being discharged on an oral prednisone taper along with some oral Zithromax as stated.  2.  Hyperglycemia. This was likely secondary to IV steroids, which is now being tapered. The patient at this point will continue his metformin and his NovoLog NPH at home as stated.  3.  Elevated troponin. This was likely in the setting of demand ischemia from asthmatic bronchitis. The patient had no acute chest pain. He was observed on telemetry. His troponins did not trend upwards. He was seen by cardiology who did not think that the patient had any evidence of congestive heart failure or acute coronary syndrome. At this point, the patient will continue his home medications, including aspirin, metoprolol, a statin and Imdur as stated.  4.  Hypertension. The patient remained hemodynamically stable. He will continue his lisinopril/hydrochlorothiazide, Imdur, and metoprolol.  5.  Hyperlipidemia. The patient was maintained on his Pravachol and he will resume that.  6.  Depression/anxiety. The patient was maintained on his Celexa and Xanax and he will resume that.   CODE STATUS: The patient is a full code. He was discharged on home oxygen, as stated.   TIME SPENT ON DISCHARGE: Forty minutes.   ____________________________ Belia Heman. Verdell Carmine, MD vjs:TT D: 06/21/2014 08:37:32 ET T: 06/21/2014 16:57:26 ET JOB#: [id number]  cc: Belia Heman. Verdell Carmine, MD, <Dictator> Youlanda Roys. Lovie Macadamia, MD Henreitta Leber MD  ELECTRONICALLY SIGNED 07/04/2014 12:48

## 2014-10-29 ENCOUNTER — Ambulatory Visit (INDEPENDENT_AMBULATORY_CARE_PROVIDER_SITE_OTHER): Payer: Medicare Other | Admitting: Internal Medicine

## 2014-10-29 ENCOUNTER — Ambulatory Visit: Payer: Medicare Other | Admitting: Internal Medicine

## 2014-10-29 ENCOUNTER — Encounter: Payer: Self-pay | Admitting: Internal Medicine

## 2014-10-29 VITALS — BP 130/60 | HR 62 | Temp 98.0°F | Ht 72.0 in | Wt 218.6 lb

## 2014-10-29 DIAGNOSIS — Z9989 Dependence on other enabling machines and devices: Principal | ICD-10-CM

## 2014-10-29 DIAGNOSIS — G4733 Obstructive sleep apnea (adult) (pediatric): Secondary | ICD-10-CM | POA: Diagnosis not present

## 2014-10-29 DIAGNOSIS — J439 Emphysema, unspecified: Secondary | ICD-10-CM

## 2014-10-29 DIAGNOSIS — J189 Pneumonia, unspecified organism: Secondary | ICD-10-CM

## 2014-10-29 MED ORDER — FLUTICASONE-SALMETEROL 500-50 MCG/DOSE IN AEPB: 1.0000 | INHALATION_SPRAY | Freq: Two times a day (BID) | RESPIRATORY_TRACT | Status: DC

## 2014-10-29 MED ORDER — ALBUTEROL SULFATE HFA 108 (90 BASE) MCG/ACT IN AERS: 2.0000 | INHALATION_SPRAY | RESPIRATORY_TRACT | Status: DC | PRN

## 2014-10-29 NOTE — Patient Instructions (Addendum)
Follow up with Dr. Stevenson Clinch in 3 months - pulmonary function testing and 6 minute walk test prior to follow up - complete out your current advair (250/50) prescripton, and then start new prescription as stated below - we will increase your Advair to 500/50 (90 day rx) - 1 puff in the AM and PM - gargle and rinse after each use.  - cont with Spiriva as directed.  - albuterol rescue inhaler - 2puff every 3-4 hours as needed for shortness of breath\wheezing\recurrent cough - you may use albuterol rescue inhaler, 2 puffs, 15 mins prior to any exertional activities (lawn care, exercise, etc).  - cont with CPAP with 2L oxygen at night

## 2014-10-29 NOTE — Progress Notes (Signed)
MRN# [id number] Ernest Mclean 06-Sep-1940   CC: Chief Complaint  Patient presents with  . Follow-up    Pt here for f/u CPAP therapy and ONO results.      Brief History: 74 year old man who was recently discharged from [facility] after admission for respiratory failure attributed to pneumonia, chronic obstructive pulmonary disease, diastolic heart failure seen for hospital followup today. He states that initially after his discharge on 02/05, he felt well until approximately 07/07/2014. At that time, for reasons unknown, he began feeling extremely weak, and noting that his blood pressure was low particularly after taking his morning medications. His wife carries with her a log of blood pressures and blood sugars and on 07/07/2014 notes a blood pressure 80s/60s. His heart rate has also progressively increased over this time. He has had multiple readings as low as that over the past few days with readings only as high as 120/80. Four days prior he developed diarrhea, having 3 to 4 very large watery bowel movements per day. He started vomiting last night and vomited throughout the night. No hematemesis, melena, or hematochezia. His wife has been trying to encourage fluid intake and states that he has actually gained 4 pounds over the past few days. At the time of presentation to the Emergency Room, he was in afib with RVR at 130-140, hypotensive, with blood pressures in the 90s over 70s and very weak. Blood sugars also elevated at 425. Currently, he states that he is doing well. He has a history of OSA, on CPAP with 2L O2 bleed in. Over the past 1 month he has had 3 admission to [facility] for various reason (sob, weakness, pneumonia, bronchitis). He has a smoking history of 1-2ppdx15 years (age [age]). Further history reveals that 3-4 months ago he was in his usual states of health with no significant dyspnea, cough, sputum production or requiring inhalers. No significant cough, worsening sob, or sputum  production today. He is accompanied by his wife today. He states that overall he is doing well, his oxygen saturation on room air is greater than 88%. Patient is a history of diabetes, and claudication of the lower extremities, wife and patient has stated that since starting supplemental oxygen his claudication has improved. However, one 6 minute walk test today and at rest on room air he did not desaturate less than 88%.  Plan - cont with CPAP, repeat CT prior to follow up, exercise, cont with O2    Events since last clinic visit: Patient presents today for a followup visit, he is accompanied by his wife. Overall patient states that his breathing is rapidly improve, his wife states that she does hear some audible wheezing with exertion. Patient states he can climb about 2 flights of stairs, but he still winded afterwards. He is currently on Lasix, Advair, Spiriva. He has been compliant with these medications. He did have an overnight pulse oximetry testing done that showed 80 events between 89 and 85% saturation, 2 L of supplemental oxygen was ordered at night for him. He has a history of obstructive sleep apnea wearing his CPAP machine nightly. Given his last admission with pneumonia and bronchitis, he had a repeat CT schedule, which will be performed on 11/02/2014.   Medication:   Current Outpatient Rx  Name  Route  Sig  Dispense  Refill  . amLODipine (NORVASC) 2.5 MG tablet   Oral   Take 2.5 mg by mouth daily. At 12 noon if bp elevated         .  amLODipine (NORVASC) 5 MG tablet   Oral   Take 5 mg by mouth daily.         Marland Kitchen apixaban (ELIQUIS) 5 MG TABS tablet   Oral   Take 5 mg by mouth 2 (two) times daily.         Marland Kitchen aspirin EC 81 MG tablet   Oral   Take 81 mg by mouth daily.         . citalopram (CELEXA) 20 MG tablet   Oral   Take 20 mg by mouth daily.      5   . diazepam (VALIUM) 5 MG tablet   Oral   Take 5 mg by mouth every 12 (twelve) hours as needed.          . Fluticasone-Salmeterol (ADVAIR) 250-50 MCG/DOSE AEPB   Inhalation   Inhale 1 puff into the lungs 2 (two) times daily.         . furosemide (LASIX) 20 MG tablet   Oral   Take 20 mg by mouth daily.         Marland Kitchen LANTUS 100 UNIT/ML injection   Subcutaneous   Inject 95 Units into the skin at bedtime.      5     Dispense as written.   . metoprolol (LOPRESSOR) 100 MG tablet   Oral   Take 100 mg by mouth 2 (two) times daily.      0   . NOVOLOG 100 UNIT/ML injection   Subcutaneous   Inject 35 Units into the skin 3 (three) times daily before meals.       0     Dispense as written.   Marland Kitchen omeprazole (PRILOSEC) 20 MG capsule   Oral   Take 20 mg by mouth daily.      3   . pravastatin (PRAVACHOL) 20 MG tablet   Oral   Take 20 mg by mouth at bedtime.      3   . tiotropium (SPIRIVA) 18 MCG inhalation capsule   Inhalation   Place 18 mcg into inhaler and inhale daily.         . Vitamin D, Ergocalciferol, (DRISDOL) 50000 UNITS CAPS capsule   Oral   Take 1 capsule by mouth once a week.      0      Review of Systems: Gen:  Denies  fever, sweats, chills HEENT: Denies blurred vision, double vision, ear pain, eye pain, hearing loss, nose bleeds, sore throat Cvc:  No dizziness, chest pain or heaviness Resp:   Admits WS:FKCL shortness of breath with exertion Gi: Denies swallowing difficulty, stomach pain, nausea or vomiting, diarrhea, constipation, bowel incontinence Gu:  Denies bladder incontinence, burning urine Ext:   No Joint pain, stiffness or swelling Skin: No skin rash, easy bruising or bleeding or hives Endoc:  No polyuria, polydipsia , polyphagia or weight change Other:  All other systems negative  Allergies:  Hydralazine; Penicillins; and Codeine  Physical Examination:  VS: BP 130/60 mmHg  Pulse 62  Temp(Src) 98 F (36.7 C) (Oral)  Ht 6' (1.829 m)  Wt 218 lb 9.6 oz (99.156 kg)  BMI 29.64 kg/m2  SpO2 96%  General Appearance: No distress  HEENT:  PERRLA, no ptosis, no other lesions noticed Pulmonary:normal breath sounds., diaphragmatic excursion normal.No wheezing, No rales   Cardiovascular:  Normal S1,S2.  No m/r/g.     Abdomen:Exam: Benign, Soft, non-tender, No masses  Skin:   warm, no rashes, no ecchymosis  Extremities: normal, no cyanosis,  clubbing, warm with normal capillary refill.      Rad results: (The following images and results were reviewed by Dr. Stevenson Clinch).addendum 11/04/2014 CT Chest 11/02/14 CT CHEST WITHOUT CONTRAST  TECHNIQUE: Multidetector CT imaging of the chest was performed following the standard protocol without IV contrast.  COMPARISON: July 15, 2014  FINDINGS: In comparison with the previous study, most of the areas of infiltrate have cleared. A small amount of patchy airspace opacity remains in the lingula inferiorly, also present on prior study. This area of infiltrate has not progressed since the prior study. No new areas of airspace opacity are identified.  On axial slice 28 series 4, there is a 4 mm nodular opacity in the superior segment of the left lower lobe. There is a tiny calcified granuloma in the lateral right base. There is mild subsegmental atelectasis in the left base laterally. New paragraph there is no appreciable thoracic adenopathy. Thyroid appears normal. There is atherosclerotic change in the aorta but no demonstrable aneurysm. The patient is status post coronary artery bypass grafting. There is extensive native coronary artery calcification. Pericardium is not thickened.  Visualized upper abdomen appears unremarkable except for atherosclerotic change in the upper abdominal aorta. There is degenerative change in the thoracic spine. There are no blastic or lytic bone lesions. There old healed rib fractures on the right.  IMPRESSION: Most of the airspace opacity has cleared compared to the previous study. A small amount of opacity remains in the lingula,  stable. This area may represent chronic atelectasis. No new airspace disease. Mild atelectasis is also noted in the left base.  4 mm nodular opacity is noted in the superior segment left lower lobe. Followup of this nodular opacity should be based on Fleischner Society guidelines. If the patient is at high risk for bronchogenic carcinoma, follow-up chest CT at 1 year is recommended. If the patient is at low risk, no follow-up is needed. This recommendation follows the consensus statement: Guidelines for Management of Small Pulmonary Nodules Detected on CT Scans: A Statement from the [place] as published in Radiology 2005; 237:395-400.  There are multiple areas of atherosclerotic calcification.  No appreciable adenopathy.     Assessment and Plan:74 year old male with history of COPD, obstructive sleep apnea, recent multifocal pneumonia, presenting for followup visit with great clinical improvement. Multifocal Pneumonia I have reviewed his last 3 CT Chest dating back to 2014, there does not seem to be a component of ILD at this time.   His recent respiratory distress is most likely  due to multifocal pneumonia, which is appropriately treated, and resolving well now.  Plan: -followup repeat CT chest on 11/02/2014 -continue with supplemental oxygen at night with CPAP. -Continue her current COPD meds    OSA on CPAP Patient with a known history of OSA on CPAP.  Overnight pulse oximetry showed multiple events (80) occurring between 89 and 85% saturation. Continue with 2 L supplemental oxygen on CPAP.   Plan: -Continue with supplemental oxygen (2 L) with CPAP at night -CPAP compliance discussed with patient, overall doing well.    COPD (chronic obstructive pulmonary disease) COPD Patient with rapid improvement since his last visit, however he still has some dyspnea on exertion and mild faint wheezing at times. Overall great clinical improvement.  Plan: -  pulmonary function testing and 6 minute walk test prior to follow up - complete out advair (250/50) prescripton, and then start new prescription as stated below - increase Advair to 500/50 (90 day rx) - 1 puff  in the AM and PM - gargle and rinse after each use.  - cont with Spiriva as directed.  - albuterol rescue inhaler - 2puff every 3-4 hours as needed for shortness of breath\wheezing\recurrent cough - may use albuterol rescue inhaler, 2 puffs, 15 mins prior to any exertional activities (lawn care, exercise, etc).  - followup CT results on Monday, 11/02/2014       Updated Medication List Outpatient Encounter Prescriptions as of 10/29/2014  Medication Sig  . amLODipine (NORVASC) 2.5 MG tablet Take 2.5 mg by mouth daily. At 12 noon if bp elevated  . amLODipine (NORVASC) 5 MG tablet Take 5 mg by mouth daily.  Marland Kitchen apixaban (ELIQUIS) 5 MG TABS tablet Take 5 mg by mouth 2 (two) times daily.  Marland Kitchen aspirin EC 81 MG tablet Take 81 mg by mouth daily.  . citalopram (CELEXA) 20 MG tablet Take 20 mg by mouth daily.  . diazepam (VALIUM) 5 MG tablet Take 5 mg by mouth every 12 (twelve) hours as needed.  . furosemide (LASIX) 20 MG tablet Take 20 mg by mouth daily.  Marland Kitchen LANTUS 100 UNIT/ML injection Inject 95 Units into the skin at bedtime.  . metoprolol (LOPRESSOR) 100 MG tablet Take 100 mg by mouth 2 (two) times daily.  Marland Kitchen NOVOLOG 100 UNIT/ML injection Inject 35 Units into the skin 3 (three) times daily before meals.   Marland Kitchen omeprazole (PRILOSEC) 20 MG capsule Take 20 mg by mouth daily.  . pravastatin (PRAVACHOL) 20 MG tablet Take 20 mg by mouth at bedtime.  Marland Kitchen tiotropium (SPIRIVA) 18 MCG inhalation capsule Place 18 mcg into inhaler and inhale daily.  . Vitamin D, Ergocalciferol, (DRISDOL) 50000 UNITS CAPS capsule Take 1 capsule by mouth every 30 (thirty) days.   . [DISCONTINUED] Fluticasone-Salmeterol (ADVAIR) 250-50 MCG/DOSE AEPB Inhale 1 puff into the lungs 2 (two) times daily.  Marland Kitchen albuterol (PROVENTIL  HFA;VENTOLIN HFA) 108 (90 BASE) MCG/ACT inhaler Inhale 2 puffs into the lungs every 4 (four) hours as needed for wheezing or shortness of breath.  . Fluticasone-Salmeterol (ADVAIR DISKUS) 500-50 MCG/DOSE AEPB Inhale 1 puff into the lungs 2 (two) times daily.  . OXYGEN Inhale 2 L into the lungs at bedtime.  . [DISCONTINUED] HUMALOG 100 UNIT/ML injection 35 Units by Subconjunctival route 3 (three) times daily with meals.    No facility-administered encounter medications on file as of 10/29/2014.    Orders for this visit: No orders of the defined types were placed in this encounter.    Thank  you for the visitation and for allowing  [facility] to assist in the care of your patient. Our recommendations are noted above.  Please contact us if we can be of further service.  Vilinda Boehringer, MD [facility] Office Number: [phone]

## 2014-11-02 ENCOUNTER — Ambulatory Visit
Admission: RE | Admit: 2014-11-02 | Discharge: 2014-11-02 | Disposition: A | Discharge status: Home / Self Care | Payer: Medicare Other | Source: Ambulatory Visit | Attending: Internal Medicine | Admitting: Internal Medicine

## 2014-11-02 DIAGNOSIS — J189 Pneumonia, unspecified organism: Secondary | ICD-10-CM | POA: Diagnosis present

## 2014-11-04 NOTE — Assessment & Plan Note (Signed)
COPD Patient with rapid improvement since his last visit, however he still has some dyspnea on exertion and mild faint wheezing at times. Overall great clinical improvement.  Plan: - pulmonary function testing and 6 minute walk test prior to follow up - complete out advair (250/50) prescripton, and then start new prescription as stated below - increase Advair to 500/50 (90 day rx) - 1 puff in the AM and PM - gargle and rinse after each use.  - cont with Spiriva as directed.  - albuterol rescue inhaler - 2puff every 3-4 hours as needed for shortness of breath\wheezing\recurrent cough - may use albuterol rescue inhaler, 2 puffs, 15 mins prior to any exertional activities (lawn care, exercise, etc).  - followup CT results on Monday, 11/02/2014

## 2014-11-04 NOTE — Assessment & Plan Note (Signed)
I have reviewed his last 3 CT Chest dating back to 2014, there does not seem to be a component of ILD at this time.   His recent respiratory distress is most likely  due to multifocal pneumonia, which is appropriately treated, and resolving well now.  Plan: -followup repeat CT chest on 11/02/2014 -continue with supplemental oxygen at night with CPAP. -Continue her current COPD meds

## 2014-11-04 NOTE — Assessment & Plan Note (Signed)
Patient with a known history of OSA on CPAP.  Overnight pulse oximetry showed multiple events (80) occurring between 89 and 85% saturation. Continue with 2 L supplemental oxygen on CPAP.   Plan: -Continue with supplemental oxygen (2 L) with CPAP at night -CPAP compliance discussed with patient, overall doing well.

## 2015-02-08 ENCOUNTER — Other Ambulatory Visit: Payer: Self-pay | Admitting: Internal Medicine

## 2015-02-08 DIAGNOSIS — J439 Emphysema, unspecified: Secondary | ICD-10-CM

## 2015-02-10 ENCOUNTER — Ambulatory Visit (INDEPENDENT_AMBULATORY_CARE_PROVIDER_SITE_OTHER): Payer: Medicare Other | Admitting: Internal Medicine

## 2015-02-10 ENCOUNTER — Encounter: Payer: Self-pay | Admitting: Internal Medicine

## 2015-02-10 VITALS — BP 154/84 | HR 69 | Ht 72.0 in | Wt 216.0 lb

## 2015-02-10 DIAGNOSIS — J189 Pneumonia, unspecified organism: Secondary | ICD-10-CM | POA: Diagnosis not present

## 2015-02-10 DIAGNOSIS — J439 Emphysema, unspecified: Secondary | ICD-10-CM

## 2015-02-10 DIAGNOSIS — R911 Solitary pulmonary nodule: Secondary | ICD-10-CM

## 2015-02-10 DIAGNOSIS — R0600 Dyspnea, unspecified: Secondary | ICD-10-CM

## 2015-02-10 DIAGNOSIS — Z9989 Dependence on other enabling machines and devices: Secondary | ICD-10-CM

## 2015-02-10 DIAGNOSIS — G4733 Obstructive sleep apnea (adult) (pediatric): Secondary | ICD-10-CM | POA: Diagnosis not present

## 2015-02-10 LAB — PULMONARY FUNCTION TEST
FEF 25-75 Post: 3.05 L/sec
FEF 25-75 Pre: 2.06 L/sec
FEF2575-%CHANGE-POST: 48 %
FEF2575-%PRED-POST: 120 %
FEF2575-%Pred-Pre: 81 %
FEV1-%CHANGE-POST: 11 %
FEV1-%PRED-POST: 75 %
FEV1-%Pred-Pre: 68 %
FEV1-POST: 2.59 L
FEV1-Pre: 2.32 L
FEV1FVC-%CHANGE-POST: 1 %
FEV1FVC-%PRED-PRE: 105 %
FEV6-%Change-Post: 9 %
FEV6-%PRED-POST: 75 %
FEV6-%Pred-Pre: 68 %
FEV6-Post: 3.3 L
FEV6-Pre: 3.01 L
FEV6FVC-%Pred-Post: 106 %
FEV6FVC-%Pred-Pre: 106 %
FVC-%CHANGE-POST: 9 %
FVC-%PRED-PRE: 64 %
FVC-%Pred-Post: 70 %
FVC-PRE: 3.01 L
FVC-Post: 3.3 L
POST FEV1/FVC RATIO: 78 %
PRE FEV6/FVC RATIO: 100 %
Post FEV6/FVC ratio: 100 %
Pre FEV1/FVC ratio: 77 %
RV % PRED: 147 %
RV: 3.87 L
TLC % pred: 126 %
TLC: 9.42 L

## 2015-02-10 NOTE — Assessment & Plan Note (Signed)
Small subcentimeter nodule - LUL 73mm  Plan - repeat with CT Chest with contrast in 1 year.

## 2015-02-10 NOTE — Progress Notes (Signed)
[facility] Consultation      MRN# [id number] Ernest Mclean [birth year]   CC: Chief Complaint  Patient presents with  . Follow-up    PFT/SMW results; breathing better; raspy sounding more often; Albuterol helps.       Brief History: 08/17/14 HPI 74 year old man who was recently discharged from [facility] after admission for respiratory failure attributed to pneumonia, chronic obstructive pulmonary disease, diastolic heart failure seen for hospital followup today. He states that initially after his discharge on 02/05, he felt well until approximately 07/07/2014. At that time, for reasons unknown, he began feeling extremely weak, and noting that his blood pressure was low particularly after taking his morning medications. His wife carries with her a log of blood pressures and blood sugars and on 07/07/2014 notes a blood pressure 80s/60s. His heart rate has also progressively increased over this time. He has had multiple readings as low as that over the past few days with readings only as high as 120/80. Four days prior he developed diarrhea, having 3 to 4 very large watery bowel movements per day. He started vomiting last night and vomited throughout the night. No hematemesis, melena, or hematochezia. His wife has been trying to encourage fluid intake and states that he has actually gained 4 pounds over the past few days. At the time of presentation to the Emergency Room, he was in afib with RVR at 130-140, hypotensive, with blood pressures in the 90s over 70s and very weak. Blood sugars also elevated at 425. Currently, he states that he is doing well. He has a history of OSA, on CPAP with 2L O2 bleed in. Over the past 1 month he has had 3 admission to [facility] for various reason (sob, weakness, pneumonia, bronchitis). He has a smoking history of 1-2ppdx15 years (age [age]). Further history reveals that 3-4 months ago he was in his usual states of health with no significant dyspnea,  cough, sputum production or requiring inhalers. No significant cough, worsening sob, or sputum production today. He is accompanied by his wife today. He states that overall he is doing well, his oxygen saturation on room air is greater than 88%. Patient is a history of diabetes, and claudication of the lower extremities, wife and patient has stated that since starting supplemental oxygen his claudication has improved. However, one 6 minute walk test today and at rest on room air he did not desaturate less than 88%.  Plan - cont with CPAP, repeat CT prior to follow up, exercise, cont with O2    ROV 10/29/14 Patient presents today for a followup visit, he is accompanied by his wife. Overall patient states that his breathing is rapidly improve, his wife states that she does hear some audible wheezing with exertion. Patient states he can climb about 2 flights of stairs, but he still winded afterwards. He is currently on Lasix, Advair, Spiriva. He has been compliant with these medications. He did have an overnight pulse oximetry testing done that showed 80 events between 89 and 85% saturation, 2 L of supplemental oxygen was ordered at night for him. He has a history of obstructive sleep apnea wearing his CPAP machine nightly. Given his last admission with pneumonia and bronchitis, he had a repeat CT schedule, which will be performed on 11/02/2014. Plan: - pulmonary function testing and 6 minute walk test prior to follow up - complete out advair (250/50) prescripton, and then start new prescription as stated below - increase Advair to 500/50 (90 day rx) -  1 puff in the AM and PM - gargle and rinse after each use.  - cont with Spiriva as directed.  - albuterol rescue inhaler - 2puff every 3-4 hours as needed for shortness of breath\wheezing\recurrent cough - may use albuterol rescue inhaler, 2 puffs, 15 mins prior to any exertional activities (lawn care, exercise, etc).  - followup CT results on  Monday, 11/02/2014 -Continue with supplemental oxygen (2 L) with CPAP at night -CPAP compliance discussed with patient, overall doing well.  Events since last clinic visit: Patient presents today for a follow up visit of dyspnea/DOE, from early in the year hospitalization for multifocal pneumonia. Overall patient states that his breathing is improving, but now most symptoms are with exertion and strenuous activity. Wife asking about o2 with exercise, she has also noticed that he has some wheezing with exertion, although patient does not necessarily feel this way.  Today he had his pfts and 63mwt done. At his last visit he had a CT chest done, which showed a 54mm LLL lung nodule, which will be observed with surveillance CT scanning.   Patient does have a hx of dCHF and PVD/PAD for which he has seen a cardiologist and vascular specialist about in the past. Wife states that when he was wearing supplemental o2 he was able to walk and perform strenuous activity without significant dyspnea.    Medication:   Current Outpatient Rx  Name  Route  Sig  Dispense  Refill  . albuterol (PROVENTIL HFA;VENTOLIN HFA) 108 (90 BASE) MCG/ACT inhaler   Inhalation   Inhale 2 puffs into the lungs every 4 (four) hours as needed for wheezing or shortness of breath.   1 Inhaler   2   . amLODipine (NORVASC) 2.5 MG tablet   Oral   Take 2.5 mg by mouth daily. At 12 noon if bp elevated         . amLODipine (NORVASC) 5 MG tablet      Take 5mg  daily if blood pressure is elevated         . apixaban (ELIQUIS) 5 MG TABS tablet   Oral   Take 5 mg by mouth 2 (two) times daily.         Marland Kitchen aspirin EC 81 MG tablet   Oral   Take 81 mg by mouth daily.         . citalopram (CELEXA) 20 MG tablet   Oral   Take 20 mg by mouth daily.      5   . diazepam (VALIUM) 5 MG tablet   Oral   Take 5 mg by mouth every 12 (twelve) hours as needed.         . Fluticasone-Salmeterol (ADVAIR DISKUS) 500-50 MCG/DOSE AEPB    Inhalation   Inhale 1 puff into the lungs 2 (two) times daily.   60 each   5   . furosemide (LASIX) 20 MG tablet   Oral   Take 20 mg by mouth daily.         Marland Kitchen LANTUS 100 UNIT/ML injection      If blood sugar is greater than 100 >> 95 units, if blood sugar is less than 100 >> 47 units, if blood sugar is less than 70 >> 0 units      5     Dispense as written.   . metFORMIN (GLUCOPHAGE) 500 MG tablet      Take 2 tablets with supper         . metoprolol (LOPRESSOR)  100 MG tablet   Oral   Take 100 mg by mouth 2 (two) times daily.      0   . NOVOLOG 100 UNIT/ML injection      Inject 22 units before breakfast, 25 units before lunch, 35 units before supper      0     Dispense as written.   Marland Kitchen omeprazole (PRILOSEC) 20 MG capsule   Oral   Take 20 mg by mouth daily.      3   . OXYGEN   Inhalation   Inhale 2 L into the lungs at bedtime.         . pravastatin (PRAVACHOL) 20 MG tablet   Oral   Take 20 mg by mouth at bedtime.      3   . tiotropium (SPIRIVA) 18 MCG inhalation capsule   Inhalation   Place 18 mcg into inhaler and inhale daily.         . Vitamin D, Ergocalciferol, (DRISDOL) 50000 UNITS CAPS capsule   Oral   Take 1 capsule by mouth every 30 (thirty) days.       0      Review of Systems  Constitutional: Negative for fever, chills and weight loss.  HENT: Negative for hearing loss.   Eyes: Negative for blurred vision.  Respiratory: Positive for shortness of breath. Negative for cough, sputum production and wheezing.        Improving SOB, now mostly with exertion  Cardiovascular: Positive for claudication. Negative for chest pain, palpitations and orthopnea.  Gastrointestinal: Negative for heartburn and nausea.  Genitourinary: Negative for dysuria.  Musculoskeletal: Positive for myalgias.       Chronic leg pain with exercise or exertion, has PVD/PAD  Skin: Negative for rash.  Neurological: Negative for headaches.      Allergies:    Hydralazine; Penicillins; and Codeine  Physical Examination:  VS: BP 154/84 mmHg  Pulse 69  Ht 6' (1.829 m)  Wt 216 lb (97.977 kg)  BMI 29.29 kg/m2  SpO2 97%  General Appearance: No distress  HEENT: PERRLA, no ptosis, no other lesions noticed Pulmonary:normal breath sounds., diaphragmatic excursion normal.No wheezing, No rales   Cardiovascular:  Normal S1,S2.  No m/r/g.     Abdomen:Exam: Benign, Soft, non-tender, No masses  Skin:   warm, no rashes, no ecchymosis  Extremities: normal, no cyanosis, clubbing, warm with normal capillary refill.      Rad results: (The following images and results were reviewed by Dr. Stevenson Clinch). CT Chest 10/2014 IMPRESSION: Most of the airspace opacity has cleared compared to the previous study. A small amount of opacity remains in the lingula, stable. This area may represent chronic atelectasis. No new airspace disease. Mild atelectasis is also noted in the left base.  4 mm nodular opacity is noted in the superior segment left lower lobe. Followup of this nodular opacity should be based on Fleischner Society guidelines. If the patient is at high risk for bronchogenic carcinoma, follow-up chest CT at 1 year is recommended. If the patient is at low risk, no follow-up is needed. This recommendation follows the consensus statement: Guidelines for Management of Small Pulmonary Nodules Detected on CT Scans: A Statement from the [place] as published in Radiology 2005; 237:395-400.    6MWT - 02/10/15 - distance: 248m - lowest saturation: 97% - no complaints during walk, no need for supplemental O2 with exertion  PFTs 02/10/15 postBD FEV1 75% postBD FVC 70% FEV1/FVC 77% RV 147 TLC 126 RV/TLC 110 DLCO -  unable toperform Impression - decrease FEV1, preserved FEV1/FVC ratio. Mild/Mod obstruction on inhalers, air trapping/hyperinflation noted.  Significant response to BD, >275ml on FVC and FEV1 post BD   Assessment and Plan: Solitary  pulmonary nodule Small subcentimeter nodule - LUL 95mm  Plan - repeat with CT Chest with contrast in 1 year.   COPD (chronic obstructive pulmonary disease) COPD Patient with rapid improvement since his last visit, however he still has some dyspnea on exertion, which is improving.  Overall great clinical improvement.  PFTs today with - preserved FEV1/FVC ratio, airtrapping and hyperinflation, significant response to BD.  6MWt today - 246m, no desats <88%, no need for supplemental O2.   Plan: - cont with Advair to 500/50 (90 day rx) - 1 puff in the AM and PM - gargle and rinse after each use.  - cont with Spiriva as directed.  - albuterol rescue inhaler - 2puff every 3-4 hours as needed for shortness of breath\wheezing\recurrent cough - may use albuterol rescue inhaler, 2 puffs, 15 mins prior to any exertional activities (lawn care, exercise, etc).  - consider speaking with Cardiologist or vascular for claudication with exertion/exertion given hx of PVD/PAD, may qualify for additional supplemental oxygen once evaluated by these specialist.        Multifocal Pneumonia I have reviewed his last 3 CT Chest dating back to 2014, there does not seem to be a component of ILD at this time.   His recent respiratory distress is most likely  due to multifocal pneumonia, which is appropriately treated, and resolving well now.  Plan: -CT chest on 11/02/2014 with almost complete resolution of opacities.  -continue with supplemental oxygen at night with CPAP. -Continue her current COPD meds      OSA on CPAP Patient with a known history of OSA on CPAP.  Overnight pulse oximetry showed multiple events (80) occurring between 89 and 85% saturation. Continue with 2 L supplemental oxygen on CPAP.   Plan: -Continue with supplemental oxygen (2 L) with CPAP at night -CPAP compliance discussed with patient, overall doing well.        Updated Medication List Outpatient Encounter  Prescriptions as of 02/10/2015  Medication Sig  . albuterol (PROVENTIL HFA;VENTOLIN HFA) 108 (90 BASE) MCG/ACT inhaler Inhale 2 puffs into the lungs every 4 (four) hours as needed for wheezing or shortness of breath.  Marland Kitchen amLODipine (NORVASC) 2.5 MG tablet Take 2.5 mg by mouth daily. At 12 noon if bp elevated  . amLODipine (NORVASC) 5 MG tablet Take 5mg  daily if blood pressure is elevated  . apixaban (ELIQUIS) 5 MG TABS tablet Take 5 mg by mouth 2 (two) times daily.  Marland Kitchen aspirin EC 81 MG tablet Take 81 mg by mouth daily.  . citalopram (CELEXA) 20 MG tablet Take 20 mg by mouth daily.  . diazepam (VALIUM) 5 MG tablet Take 5 mg by mouth every 12 (twelve) hours as needed.  . Fluticasone-Salmeterol (ADVAIR DISKUS) 500-50 MCG/DOSE AEPB Inhale 1 puff into the lungs 2 (two) times daily.  . furosemide (LASIX) 20 MG tablet Take 20 mg by mouth daily.  Marland Kitchen LANTUS 100 UNIT/ML injection If blood sugar is greater than 100 >> 95 units, if blood sugar is less than 100 >> 47 units, if blood sugar is less than 70 >> 0 units  . metFORMIN (GLUCOPHAGE) 500 MG tablet Take 2 tablets with supper  . metoprolol (LOPRESSOR) 100 MG tablet Take 100 mg by mouth 2 (two) times daily.  Marland Kitchen NOVOLOG 100 UNIT/ML injection Inject  22 units before breakfast, 25 units before lunch, 35 units before supper  . omeprazole (PRILOSEC) 20 MG capsule Take 20 mg by mouth daily.  . OXYGEN Inhale 2 L into the lungs at bedtime.  . pravastatin (PRAVACHOL) 20 MG tablet Take 20 mg by mouth at bedtime.  Marland Kitchen tiotropium (SPIRIVA) 18 MCG inhalation capsule Place 18 mcg into inhaler and inhale daily.  . Vitamin D, Ergocalciferol, (DRISDOL) 50000 UNITS CAPS capsule Take 1 capsule by mouth every 30 (thirty) days.    No facility-administered encounter medications on file as of 02/10/2015.    Orders for this visit: Orders Placed This Encounter  Procedures  . CT Chest W Contrast    Standing Status: Future     Number of Occurrences:      Standing Expiration Date:  04/11/2017    Order Specific Question:  Reason for Exam (SYMPTOM  OR DIAGNOSIS REQUIRED)    Answer:  pulmonary nodule    Order Specific Question:  Preferred imaging location?    Answer:  [facility]    Thank  you for the visitation and for allowing  [facility] to assist in the care of your patient. Our recommendations are noted above.  Please contact us if we can be of further service.  Vilinda Boehringer, MD [facility] Office Number: [phone]

## 2015-02-10 NOTE — Progress Notes (Signed)
PFT performed today. 

## 2015-02-10 NOTE — Patient Instructions (Addendum)
Follow up with Dr. Stevenson Clinch in: 6 months - cont with current inhalers - CT chest with contrast in 1 year, for small pulmonary nodule - cont with diet and exercise.  - for leg claudication or PVD, follow up with your Cardiologist or Vascular physician

## 2015-02-11 NOTE — Progress Notes (Signed)
6mwt

## 2015-02-14 NOTE — Assessment & Plan Note (Signed)
I have reviewed his last 3 CT Chest dating back to 2014, there does not seem to be a component of ILD at this time.   His recent respiratory distress is most likely  due to multifocal pneumonia, which is appropriately treated, and resolving well now.  Plan: -CT chest on 11/02/2014 with almost complete resolution of opacities.  -continue with supplemental oxygen at night with CPAP. -Continue her current COPD meds

## 2015-02-14 NOTE — Assessment & Plan Note (Signed)
COPD Patient with rapid improvement since his last visit, however he still has some dyspnea on exertion, which is improving.  Overall great clinical improvement.  PFTs today with - preserved FEV1/FVC ratio, airtrapping and hyperinflation, significant response to BD.  6MWt today - 242m, no desats <88%, no need for supplemental O2.   Plan: - cont with Advair to 500/50 (90 day rx) - 1 puff in the AM and PM - gargle and rinse after each use.  - cont with Spiriva as directed.  - albuterol rescue inhaler - 2puff every 3-4 hours as needed for shortness of breath\wheezing\recurrent cough - may use albuterol rescue inhaler, 2 puffs, 15 mins prior to any exertional activities (lawn care, exercise, etc).  - consider speaking with Cardiologist or vascular for claudication with exertion/exertion given hx of PVD/PAD, may qualify for additional supplemental oxygen once evaluated by these specialist.

## 2015-02-14 NOTE — Assessment & Plan Note (Signed)
Patient with a known history of OSA on CPAP.  Overnight pulse oximetry showed multiple events (80) occurring between 89 and 85% saturation. Continue with 2 L supplemental oxygen on CPAP.   Plan: -Continue with supplemental oxygen (2 L) with CPAP at night -CPAP compliance discussed with patient, overall doing well.   

## 2015-05-12 ENCOUNTER — Emergency Department: Payer: Medicare Other

## 2015-05-12 ENCOUNTER — Encounter: Payer: Self-pay | Admitting: Emergency Medicine

## 2015-05-12 ENCOUNTER — Observation Stay
Admission: EM | Admit: 2015-05-12 | Discharge: 2015-05-14 | Disposition: A | Discharge status: Home / Self Care | Payer: Medicare Other | Attending: Internal Medicine | Admitting: Internal Medicine

## 2015-05-12 DIAGNOSIS — Z880 Allergy status to penicillin: Secondary | ICD-10-CM | POA: Insufficient documentation

## 2015-05-12 DIAGNOSIS — E785 Hyperlipidemia, unspecified: Secondary | ICD-10-CM | POA: Diagnosis not present

## 2015-05-12 DIAGNOSIS — I2510 Atherosclerotic heart disease of native coronary artery without angina pectoris: Secondary | ICD-10-CM | POA: Diagnosis not present

## 2015-05-12 DIAGNOSIS — G4733 Obstructive sleep apnea (adult) (pediatric): Secondary | ICD-10-CM | POA: Diagnosis not present

## 2015-05-12 DIAGNOSIS — F419 Anxiety disorder, unspecified: Secondary | ICD-10-CM | POA: Diagnosis not present

## 2015-05-12 DIAGNOSIS — E11621 Type 2 diabetes mellitus with foot ulcer: Secondary | ICD-10-CM | POA: Insufficient documentation

## 2015-05-12 DIAGNOSIS — N183 Chronic kidney disease, stage 3 (moderate): Secondary | ICD-10-CM | POA: Diagnosis not present

## 2015-05-12 DIAGNOSIS — E1129 Type 2 diabetes mellitus with other diabetic kidney complication: Secondary | ICD-10-CM

## 2015-05-12 DIAGNOSIS — E11649 Type 2 diabetes mellitus with hypoglycemia without coma: Secondary | ICD-10-CM | POA: Diagnosis not present

## 2015-05-12 DIAGNOSIS — E1121 Type 2 diabetes mellitus with diabetic nephropathy: Secondary | ICD-10-CM | POA: Diagnosis not present

## 2015-05-12 DIAGNOSIS — E1122 Type 2 diabetes mellitus with diabetic chronic kidney disease: Secondary | ICD-10-CM | POA: Insufficient documentation

## 2015-05-12 DIAGNOSIS — Z87891 Personal history of nicotine dependence: Secondary | ICD-10-CM | POA: Insufficient documentation

## 2015-05-12 DIAGNOSIS — I130 Hypertensive heart and chronic kidney disease with heart failure and stage 1 through stage 4 chronic kidney disease, or unspecified chronic kidney disease: Secondary | ICD-10-CM | POA: Insufficient documentation

## 2015-05-12 DIAGNOSIS — Z888 Allergy status to other drugs, medicaments and biological substances status: Secondary | ICD-10-CM | POA: Insufficient documentation

## 2015-05-12 DIAGNOSIS — Z7982 Long term (current) use of aspirin: Secondary | ICD-10-CM | POA: Insufficient documentation

## 2015-05-12 DIAGNOSIS — Z9989 Dependence on other enabling machines and devices: Secondary | ICD-10-CM

## 2015-05-12 DIAGNOSIS — K219 Gastro-esophageal reflux disease without esophagitis: Secondary | ICD-10-CM | POA: Diagnosis present

## 2015-05-12 DIAGNOSIS — Z7901 Long term (current) use of anticoagulants: Secondary | ICD-10-CM | POA: Insufficient documentation

## 2015-05-12 DIAGNOSIS — F329 Major depressive disorder, single episode, unspecified: Secondary | ICD-10-CM | POA: Diagnosis not present

## 2015-05-12 DIAGNOSIS — I739 Peripheral vascular disease, unspecified: Secondary | ICD-10-CM | POA: Diagnosis not present

## 2015-05-12 DIAGNOSIS — I482 Chronic atrial fibrillation: Secondary | ICD-10-CM | POA: Insufficient documentation

## 2015-05-12 DIAGNOSIS — R911 Solitary pulmonary nodule: Secondary | ICD-10-CM | POA: Diagnosis not present

## 2015-05-12 DIAGNOSIS — Z9981 Dependence on supplemental oxygen: Secondary | ICD-10-CM | POA: Insufficient documentation

## 2015-05-12 DIAGNOSIS — Z79899 Other long term (current) drug therapy: Secondary | ICD-10-CM | POA: Diagnosis not present

## 2015-05-12 DIAGNOSIS — R109 Unspecified abdominal pain: Secondary | ICD-10-CM | POA: Insufficient documentation

## 2015-05-12 DIAGNOSIS — E11610 Type 2 diabetes mellitus with diabetic neuropathic arthropathy: Secondary | ICD-10-CM | POA: Diagnosis not present

## 2015-05-12 DIAGNOSIS — I252 Old myocardial infarction: Secondary | ICD-10-CM | POA: Diagnosis not present

## 2015-05-12 DIAGNOSIS — I440 Atrioventricular block, first degree: Secondary | ICD-10-CM | POA: Insufficient documentation

## 2015-05-12 DIAGNOSIS — Z794 Long term (current) use of insulin: Secondary | ICD-10-CM | POA: Insufficient documentation

## 2015-05-12 DIAGNOSIS — Z885 Allergy status to narcotic agent status: Secondary | ICD-10-CM | POA: Insufficient documentation

## 2015-05-12 DIAGNOSIS — J449 Chronic obstructive pulmonary disease, unspecified: Secondary | ICD-10-CM | POA: Diagnosis not present

## 2015-05-12 DIAGNOSIS — I509 Heart failure, unspecified: Secondary | ICD-10-CM | POA: Insufficient documentation

## 2015-05-12 DIAGNOSIS — I10 Essential (primary) hypertension: Secondary | ICD-10-CM | POA: Diagnosis present

## 2015-05-12 DIAGNOSIS — J441 Chronic obstructive pulmonary disease with (acute) exacerbation: Secondary | ICD-10-CM | POA: Diagnosis present

## 2015-05-12 DIAGNOSIS — Z951 Presence of aortocoronary bypass graft: Secondary | ICD-10-CM | POA: Diagnosis not present

## 2015-05-12 DIAGNOSIS — E162 Hypoglycemia, unspecified: Secondary | ICD-10-CM | POA: Diagnosis present

## 2015-05-12 DIAGNOSIS — E119 Type 2 diabetes mellitus without complications: Secondary | ICD-10-CM

## 2015-05-12 DIAGNOSIS — I4892 Unspecified atrial flutter: Secondary | ICD-10-CM | POA: Insufficient documentation

## 2015-05-12 HISTORY — DX: Chronic kidney disease, unspecified: N18.9

## 2015-05-12 LAB — CBC WITH DIFFERENTIAL/PLATELET
BASOS ABS: 0.1 10*3/uL (ref 0–0.1)
Basophils Relative: 1 %
EOS PCT: 2 %
Eosinophils Absolute: 0.3 10*3/uL (ref 0–0.7)
HCT: 40.7 % (ref 40.0–52.0)
HEMOGLOBIN: 13.1 g/dL (ref 13.0–18.0)
LYMPHS PCT: 14 %
Lymphs Abs: 1.7 10*3/uL (ref 1.0–3.6)
MCH: 27.2 pg (ref 26.0–34.0)
MCHC: 32.1 g/dL (ref 32.0–36.0)
MCV: 84.7 fL (ref 80.0–100.0)
Monocytes Absolute: 1.1 10*3/uL — ABNORMAL HIGH (ref 0.2–1.0)
Monocytes Relative: 9 %
NEUTROS PCT: 74 %
Neutro Abs: 8.7 10*3/uL — ABNORMAL HIGH (ref 1.4–6.5)
PLATELETS: 223 10*3/uL (ref 150–440)
RBC: 4.81 MIL/uL (ref 4.40–5.90)
RDW: 15.7 % — AB (ref 11.5–14.5)
WBC: 11.8 10*3/uL — AB (ref 3.8–10.6)

## 2015-05-12 LAB — COMPREHENSIVE METABOLIC PANEL
ALT: 12 U/L — ABNORMAL LOW (ref 17–63)
ANION GAP: 8 (ref 5–15)
AST: 19 U/L (ref 15–41)
Albumin: 4.7 g/dL (ref 3.5–5.0)
Alkaline Phosphatase: 120 U/L (ref 38–126)
BUN: 38 mg/dL — ABNORMAL HIGH (ref 6–20)
CHLORIDE: 104 mmol/L (ref 101–111)
CO2: 29 mmol/L (ref 22–32)
Calcium: 9.8 mg/dL (ref 8.9–10.3)
Creatinine, Ser: 1.63 mg/dL — ABNORMAL HIGH (ref 0.61–1.24)
GFR, EST AFRICAN AMERICAN: 47 mL/min — AB (ref >60)
GFR, EST NON AFRICAN AMERICAN: 40 mL/min — AB (ref >60)
Glucose, Bld: 57 mg/dL — ABNORMAL LOW (ref 65–99)
POTASSIUM: 3.9 mmol/L (ref 3.5–5.1)
Sodium: 141 mmol/L (ref 135–145)
Total Bilirubin: 0.5 mg/dL (ref 0.3–1.2)
Total Protein: 8 g/dL (ref 6.5–8.1)

## 2015-05-12 LAB — GLUCOSE, CAPILLARY
GLUCOSE-CAPILLARY: 49 mg/dL — AB (ref 65–99)
GLUCOSE-CAPILLARY: 100 mg/dL — AB (ref 65–99)
GLUCOSE-CAPILLARY: 165 mg/dL — AB (ref 65–99)
GLUCOSE-CAPILLARY: 215 mg/dL — AB (ref 65–99)

## 2015-05-12 LAB — TROPONIN I

## 2015-05-12 LAB — LIPASE, BLOOD: LIPASE: 44 U/L (ref 11–51)

## 2015-05-12 MED ORDER — DEXTROSE-NACL 5-0.9 % IV SOLN
INTRAVENOUS | Status: DC
  Administered 2015-05-12: 20:00:00 via INTRAVENOUS

## 2015-05-12 MED ORDER — ONDANSETRON HCL 4 MG/2ML IJ SOLN
4.0000 mg | Freq: Once | INTRAMUSCULAR | Status: AC | PRN
  Administered 2015-05-12: 4 mg via INTRAVENOUS
  Filled 2015-05-12: qty 2

## 2015-05-12 NOTE — H&P (Addendum)
[place] [facility] [place] NAME: Ernest Mclean    MR#:  [id number]  DATE OF BIRTH:  1940-07-31  DATE OF ADMISSION:  05/12/2015  PRIMARY CARE PHYSICIAN: Juluis Pitch, MD   REQUESTING/REFERRING PHYSICIAN: Clearnce Hasten, MD  CHIEF COMPLAINT:   Chief Complaint  Patient presents with  . Hypoglycemia    HISTORY OF PRESENT ILLNESS:  Ernest Mclean  is a 74 y.o. male who presents with a hypo-glycemia. Patient states that just before dinnertime this evening his blood sugar was found to be relatively low at around 80. He then had his dinner as well as his of cake for dessert from recent birthday celebration. Despite this his recheck on his blood sugar was lower. He did not take his insulin, and a short time thereafter began feeling diaphoretic, flushed, weak, lethargic. Recheck of his blood sugar found it to be very low in the 30s and 40s. He and his wife called EMS who administered D50 a few times and were able to get his blood sugar up some. However, it did not rise significantly, and in the ED and remained low until he was initiated on a dextrose drip. Hospitalists were called for admission for persistent hypoglycemia.  PAST MEDICAL HISTORY:   Past Medical History  Diagnosis Date  . Diabetes ([place])   . Hypertension   . COPD (chronic obstructive pulmonary disease) ([place])   . Congestive heart failure ([place])   . OSA on CPAP   . Atrial fibrillation ([place])   . CKD (chronic kidney disease)   . CAD (coronary artery disease)     PAST SURGICAL HISTORY:   Past Surgical History  Procedure Laterality Date  . Heart bypass    . Hernia repair    . Clavicle surgery      SOCIAL HISTORY:   Social History  Substance Use Topics  . Smoking status: Former Smoker -- 1.00 packs/day for 30 years    Types: Cigarettes  . Smokeless tobacco: Never Used     Comment: quit 12/14/2001  . Alcohol Use: No    FAMILY HISTORY:   Family History  Problem Relation Age of  Onset  . Stroke    . Diabetes    . Breast cancer    . Colon cancer      DRUG ALLERGIES:   Allergies  Allergen Reactions  . Hydralazine Other (See Comments)    tongue swollen and couldn't wake up ---- not positive it was this or a mix of this with something else or high sugar  . Penicillins Other (See Comments)    Passed out (at 74 yrs old) Has patient had a PCN reaction causing immediate rash, facial/tongue/throat swelling, SOB or lightheadedness with hypotension: No Has patient had a PCN reaction causing severe rash involving mucus membranes or skin necrosis: No Has patient had a PCN reaction that required hospitalization No Has patient had a PCN reaction occurring within the last 10 years: No If all of the above answers are "NO", then may proceed with Cephalosporin use.   . Codeine Hives, Rash and Swelling    MEDICATIONS AT HOME:   Prior to Admission medications   Medication Sig Start Date End Date Taking? Authorizing Provider  albuterol (PROVENTIL HFA;VENTOLIN HFA) 108 (90 BASE) MCG/ACT inhaler Inhale 2 puffs into the lungs every 4 (four) hours as needed for wheezing or shortness of breath. 10/29/14   Vishal Mungal, MD  amLODipine (NORVASC) 2.5 MG tablet Take 2.5 mg by mouth daily. At  12 noon if bp elevated 08/17/14   Historical Provider, MD  amLODipine (NORVASC) 5 MG tablet Take 5mg  daily if blood pressure is elevated 08/17/14   Historical Provider, MD  apixaban (ELIQUIS) 5 MG TABS tablet Take 5 mg by mouth 2 (two) times daily. 08/17/14   Historical Provider, MD  aspirin EC 81 MG tablet Take 81 mg by mouth daily. 08/17/14   Historical Provider, MD  citalopram (CELEXA) 20 MG tablet Take 20 mg by mouth daily. 07/30/14   Historical Provider, MD  diazepam (VALIUM) 5 MG tablet Take 5 mg by mouth every 12 (twelve) hours as needed. 10/24/13   Historical Provider, MD  Fluticasone-Salmeterol (ADVAIR DISKUS) 500-50 MCG/DOSE AEPB Inhale 1 puff into the lungs 2 (two) times daily. 10/29/14   Vishal  Mungal, MD  furosemide (LASIX) 20 MG tablet Take 20 mg by mouth daily.    Historical Provider, MD  LANTUS 100 UNIT/ML injection If blood sugar is greater than 100 >> 95 units, if blood sugar is less than 100 >> 47 units, if blood sugar is less than 70 >> 0 units 08/11/14   Historical Provider, MD  metFORMIN (GLUCOPHAGE) 500 MG tablet Take 2 tablets with supper    Historical Provider, MD  metoprolol (LOPRESSOR) 100 MG tablet Take 100 mg by mouth 2 (two) times daily. 06/17/14   Historical Provider, MD  NOVOLOG 100 UNIT/ML injection Inject 22 units before breakfast, 25 units before lunch, 35 units before supper 07/17/14   Historical Provider, MD  omeprazole (PRILOSEC) 20 MG capsule Take 20 mg by mouth daily. 08/07/14   Historical Provider, MD  OXYGEN Inhale 2 L into the lungs at bedtime.    Historical Provider, MD  pravastatin (PRAVACHOL) 20 MG tablet Take 20 mg by mouth at bedtime. 07/31/14   Historical Provider, MD  tiotropium (SPIRIVA) 18 MCG inhalation capsule Place 18 mcg into inhaler and inhale daily. 07/31/14   Historical Provider, MD  Vitamin D, Ergocalciferol, (DRISDOL) 50000 UNITS CAPS capsule Take 1 capsule by mouth every 30 (thirty) days.  08/12/14   Historical Provider, MD    REVIEW OF SYSTEMS:  Review of Systems  Constitutional: Positive for malaise/fatigue and diaphoresis. Negative for fever, chills and weight loss.  HENT: Negative for ear pain, hearing loss and tinnitus.   Eyes: Negative for blurred vision, double vision, pain and redness.  Respiratory: Negative for cough, hemoptysis and shortness of breath.   Cardiovascular: Negative for chest pain, palpitations, orthopnea and leg swelling.  Gastrointestinal: Negative for nausea, vomiting, abdominal pain, diarrhea and constipation.  Genitourinary: Negative for dysuria, frequency and hematuria.  Musculoskeletal: Negative for back pain, joint pain and neck pain.  Skin:       No acne, rash, or lesions  Neurological: Positive for  weakness. Negative for dizziness, tremors and focal weakness.  Endo/Heme/Allergies: Negative for polydipsia. Does not bruise/bleed easily.  Psychiatric/Behavioral: Negative for depression. The patient is not nervous/anxious and does not have insomnia.      VITAL SIGNS:   Filed Vitals:   05/12/15 2130 05/12/15 2200 05/12/15 2230 05/12/15 2300  BP: 164/69 162/65 151/67 154/64  Pulse: 65 62 63 55  Temp:      TempSrc:      Resp: 20 11 11 19   SpO2: 92% 97% 93% 96%   Wt Readings from Last 3 Encounters:  02/10/15 97.977 kg (216 lb)  10/29/14 99.156 kg (218 lb 9.6 oz)  08/17/14 97.977 kg (216 lb)    PHYSICAL EXAMINATION:  Physical Exam  Vitals reviewed.  Constitutional: He is oriented to person, place, and time. He appears well-developed and well-nourished. No distress.  HENT:  Head: Normocephalic and atraumatic.  Mouth/Throat: Oropharynx is clear and moist.  Eyes: Conjunctivae and EOM are normal. Pupils are equal, round, and reactive to light. No scleral icterus.  Neck: Normal range of motion. Neck supple. No JVD present. No thyromegaly present.  Cardiovascular: Normal rate, regular rhythm and intact distal pulses.  Exam reveals no gallop and no friction rub.   No murmur heard. Respiratory: Effort normal and breath sounds normal. No respiratory distress. He has no wheezes. He has no rales.  GI: Soft. Bowel sounds are normal. He exhibits no distension. There is no tenderness.  Musculoskeletal: Normal range of motion. He exhibits no edema.  No arthritis, no gout  Lymphadenopathy:    He has no cervical adenopathy.  Neurological: He is alert and oriented to person, place, and time. No cranial nerve deficit.  No dysarthria, no aphasia  Skin: Skin is warm and dry. No rash noted. No erythema.  Psychiatric: He has a normal mood and affect. His behavior is normal. Judgment and thought content normal.    LABORATORY PANEL:   CBC  Recent Labs Lab 05/12/15 2027  WBC 11.8*  HGB 13.1   HCT 40.7  PLT 223   ------------------------------------------------------------------------------------------------------------------  Chemistries   Recent Labs Lab 05/12/15 2027  NA 141  K 3.9  CL 104  CO2 29  GLUCOSE 57*  BUN 38*  CREATININE 1.63*  CALCIUM 9.8  AST 19  ALT 12*  ALKPHOS 120  BILITOT 0.5   ------------------------------------------------------------------------------------------------------------------  Cardiac Enzymes  Recent Labs Lab 05/12/15 2027  TROPONINI <0.03   ------------------------------------------------------------------------------------------------------------------  RADIOLOGY:  Dg Chest 1 View  05/12/2015  CLINICAL DATA:  74 year old male with hyperglycemia. No chest complaints. History of COPD and hypertension. EXAM: CHEST 1 VIEW COMPARISON:  Chest CT dated 11/02/2014 FINDINGS: Single-view of the chest does not demonstrate any focal consolidation. No pleural effusion or pneumothorax. Top-normal cardiac size. Median sternotomy wires. Osseous structures are grossly unremarkable. IMPRESSION: No active disease. Electronically Signed   By: Anner Crete M.D.   On: 05/12/2015 20:49    EKG:   Orders placed or performed during the hospital encounter of 05/12/15  . ED EKG  . ED EKG  . EKG 12-Lead  . EKG 12-Lead    IMPRESSION AND PLAN:  Principal Problem:   Hypoglycemia - unclear why his blood sugars were so low, it remained so despite appropriate treatment is evening. We will keep him here tonight with frequent glucose checks. Blood sugar the time of admission is up to 200. We'll stop the dextrose fluids for now, but will have them available to restart if needed. We will also trend his cardiac enzymes serially tonight, as in the past he has had a significant myocardial infarction without ever experiencing angina. Active Problems:   Type 2 diabetes mellitus ([place]) - hold home medications for this for tonight. Sliding scale insulin set  at a very low level with frequent glucose checks. Heart healthy/carb modified diet. Check hemoglobin A1c.   COPD (chronic obstructive pulmonary disease) ([facility]) - stable, continue home meds   HTN (hypertension) - stable, continue home meds   CAD (coronary artery disease) - serial cardiac enzymes as above, continue home medications   Atrial flutter ([facility]) - chronic A. fib flutter, continue home rate controlling medications   OSA on CPAP - daily at bedtime CPAP with supplemental oxygen at home settings.   GERD (gastroesophageal reflux  disease) - equivalent home dose PPI  All the records are reviewed and case discussed with ED provider. Management plans discussed with the patient and/or family.  DVT PROPHYLAXIS: Systemic anticoagulation  GI PROPHYLAXIS: PPI at home dose  ADMISSION STATUS: Observation  CODE STATUS: Full  TOTAL TIME TAKING CARE OF THIS PATIENT: 45 minutes.    Blayke Cordrey FIELDING 05/12/2015, 11:31 PM  [facility]  Office  [phone]  CC: Primary care physician; Juluis Pitch, MD

## 2015-05-12 NOTE — ED Notes (Signed)
Pt informed that urine specimen needed for test, states not able now but will try again.

## 2015-05-12 NOTE — ED Notes (Addendum)
Pt presents to ED via [facility] for hyperglycemia. Reports symptoms of lightheadedness. Initial CBG-38 and then CBG-58 per EMS after ingestion of 2 oral glucose and tablets. Pt also had a sandwich sugary drinks. Pt arrived c/o dizziness, stating his sugar might be still low, initial CBG-49 at arrival to ED. Denies LOC. Pt reports had regular 30 units of novolog tonight. Pt alerts and oriented x4 at this time, airway intact.

## 2015-05-12 NOTE — ED Provider Notes (Signed)
[facility] Emergency Department Provider Note  ____________________________________________  Time seen: Seen upon arrival to the emergency department  I have reviewed the triage vital signs and the nursing notes.   HISTORY  Chief Complaint Hypoglycemia    HPI Ernest Mclean is a 74 y.o. male with a history of "brittle diabetes" as well as coronary artery disease who is presenting tonight with low blood sugar. He says that he had a normal meal tonight at dinner and then also took his normal doses of insulin. Despite this he became dizzy and could not get up from his bed. His wife said that despite 2 servings of apple juice, nuts, your covered raisins and 2 tubes of glucose per EMS his sugar has still only been in the 50s at its highest. The patient said that this all started at about 6 PM. He said after dinner he ate a sandwich a salad and also a piece of cake after this. He has had no recent adjustments in his insulin or metformin. He has a history of kidney failure.  He denies feeling ill lately with any nausea vomiting or diarrhea. Denies any pain at this time. His wife was concerned before because she said he was saying he had abdominal pain during the episode of confusion and weakness but the abdominal pain is since resolved. She said that she was also thought that she saw swelling to the right side of his abdomen but this is also since resolved. The patient does say that he has old bruising to his abdomen since just after Thanksgiving when a Christmas tree fell into his abdomen. However he has not had persistent pain since then and says that the bruising there has begun to resolve. At this point does report mild dizziness and weakness which he says is typical for when his sugar is low.    Past Medical History  Diagnosis Date  . Diabetes ([place])   . Hypertension   . COPD (chronic obstructive pulmonary disease) ([place])   . Congestive heart failure ([place])   . OSA on CPAP    . Atrial fibrillation ([place])   . CKD (chronic kidney disease)   . CAD (coronary artery disease)     Patient Active Problem List   Diagnosis Date Noted  . Hypoglycemia 05/12/2015  . Type 2 diabetes mellitus ([place]) 05/12/2015  . HTN (hypertension) 05/12/2015  . CAD (coronary artery disease) 05/12/2015  . Atrial flutter ([place]) 05/12/2015  . Solitary pulmonary nodule 02/10/2015  . OSA on CPAP 08/17/2014  . COPD (chronic obstructive pulmonary disease) ([place]) 08/17/2014  . Multifocal Pneumonia 08/17/2014    Past Surgical History  Procedure Laterality Date  . Heart bypass    . Hernia repair    . Clavicle surgery      Current Outpatient Rx  Name  Route  Sig  Dispense  Refill  . albuterol (PROVENTIL HFA;VENTOLIN HFA) 108 (90 BASE) MCG/ACT inhaler   Inhalation   Inhale 2 puffs into the lungs every 4 (four) hours as needed for wheezing or shortness of breath.   1 Inhaler   2   . amLODipine (NORVASC) 2.5 MG tablet   Oral   Take 2.5 mg by mouth daily. At 12 noon if bp elevated         . amLODipine (NORVASC) 5 MG tablet      Take 5mg  daily if blood pressure is elevated         . apixaban (ELIQUIS) 5 MG TABS tablet  Oral   Take 5 mg by mouth 2 (two) times daily.         Marland Kitchen aspirin EC 81 MG tablet   Oral   Take 81 mg by mouth daily.         . citalopram (CELEXA) 20 MG tablet   Oral   Take 20 mg by mouth daily.      5   . diazepam (VALIUM) 5 MG tablet   Oral   Take 5 mg by mouth every 12 (twelve) hours as needed.         . Fluticasone-Salmeterol (ADVAIR DISKUS) 500-50 MCG/DOSE AEPB   Inhalation   Inhale 1 puff into the lungs 2 (two) times daily.   60 each   5   . furosemide (LASIX) 20 MG tablet   Oral   Take 20 mg by mouth daily.         Marland Kitchen LANTUS 100 UNIT/ML injection      If blood sugar is greater than 100 >> 95 units, if blood sugar is less than 100 >> 47 units, if blood sugar is less than 70 >> 0 units      5     Dispense as written.   .  metFORMIN (GLUCOPHAGE) 500 MG tablet      Take 2 tablets with supper         . metoprolol (LOPRESSOR) 100 MG tablet   Oral   Take 100 mg by mouth 2 (two) times daily.      0   . NOVOLOG 100 UNIT/ML injection      Inject 22 units before breakfast, 25 units before lunch, 35 units before supper      0     Dispense as written.   Marland Kitchen omeprazole (PRILOSEC) 20 MG capsule   Oral   Take 20 mg by mouth daily.      3   . OXYGEN   Inhalation   Inhale 2 L into the lungs at bedtime.         . pravastatin (PRAVACHOL) 20 MG tablet   Oral   Take 20 mg by mouth at bedtime.      3   . tiotropium (SPIRIVA) 18 MCG inhalation capsule   Inhalation   Place 18 mcg into inhaler and inhale daily.         . Vitamin D, Ergocalciferol, (DRISDOL) 50000 UNITS CAPS capsule   Oral   Take 1 capsule by mouth every 30 (thirty) days.       0     Allergies Hydralazine; Penicillins; and Codeine  Family History  Problem Relation Age of Onset  . Stroke    . Diabetes    . Breast cancer    . Colon cancer      Social History Social History  Substance Use Topics  . Smoking status: Former Smoker -- 1.00 packs/day for 30 years    Types: Cigarettes  . Smokeless tobacco: Never Used     Comment: quit 12/14/2001  . Alcohol Use: No    Review of Systems Constitutional: No fever/chills Eyes: No visual changes. ENT: No sore throat. Cardiovascular: Denies chest pain. Respiratory: Denies shortness of breath. Gastrointestinal:  No nausea, no vomiting.  No diarrhea.  No constipation. Genitourinary: Negative for dysuria. Musculoskeletal: Negative for back pain. Skin: Negative for rash. Neurological: Negative for headaches, focal weakness or numbness.  10-point ROS otherwise negative.  ____________________________________________   PHYSICAL EXAM:  VITAL SIGNS: ED Triage Vitals  Enc Vitals Group  BP 05/12/15 2016 155/75 mmHg     Pulse Rate 05/12/15 2030 62     Resp 05/12/15 2016 16      Temp 05/12/15 2016 97.4 F (36.3 C)     Temp Source 05/12/15 2016 Oral     SpO2 05/12/15 2016 99 %     Weight --      Height --      Head Cir --      Peak Flow --      Pain Score 05/12/15 2035 0     Pain Loc --      Pain Edu? --      Excl. in [place]? --     Constitutional: Alert and oriented. Well appearing and in no acute distress. Eyes: Conjunctivae are normal. PERRL. EOMI. Head: Atraumatic. Nose: No congestion/rhinnorhea. Mouth/Throat: Mucous membranes are moist.  Oropharynx non-erythematous. Neck: No stridor.   Cardiovascular: Normal rate, regular rhythm. Grossly normal heart sounds.  Good peripheral circulation. Respiratory: Normal respiratory effort.  No retractions. Lungs CTAB. Gastrointestinal: Soft and nontender. No distention. No CVA tenderness. Musculoskeletal: No lower extremity tenderness nor edema.  No joint effusions. Neurologic:  Normal speech and language. No gross focal neurologic deficits are appreciated. No gait instability. Skin:  Skin is warm, dry and intact. No rash noted. Scattered ecchymosis to the anterior abdomen without any tenderness or swelling. Psychiatric: Mood and affect are normal. Speech and behavior are normal.  ____________________________________________   LABS (all labs ordered are listed, but only abnormal results are displayed)  Labs Reviewed  GLUCOSE, CAPILLARY - Abnormal; Notable for the following:    Glucose-Capillary 49 (*)    All other components within normal limits  CBC WITH DIFFERENTIAL/PLATELET - Abnormal; Notable for the following:    WBC 11.8 (*)    RDW 15.7 (*)    Neutro Abs 8.7 (*)    Monocytes Absolute 1.1 (*)    All other components within normal limits  COMPREHENSIVE METABOLIC PANEL - Abnormal; Notable for the following:    Glucose, Bld 57 (*)    BUN 38 (*)    Creatinine, Ser 1.63 (*)    ALT 12 (*)    GFR calc non Af Amer 40 (*)    GFR calc Af Amer 47 (*)    All other components within normal limits  GLUCOSE,  CAPILLARY - Abnormal; Notable for the following:    Glucose-Capillary 100 (*)    All other components within normal limits  GLUCOSE, CAPILLARY - Abnormal; Notable for the following:    Glucose-Capillary 165 (*)    All other components within normal limits  LIPASE, BLOOD  TROPONIN I  URINALYSIS COMPLETEWITH MICROSCOPIC ([facility] ONLY)  CBG MONITORING, ED  CBG MONITORING, ED  CBG MONITORING, ED  CBG MONITORING, ED  CBG MONITORING, ED   ____________________________________________  EKG  ED ECG REPORT I, [place],  Youlanda Roys, the attending physician, personally viewed and interpreted this ECG.   Date: 05/12/2015  EKG Time: 2026  Rate: 62  Rhythm: Atrial flutter with 4-1 AV block.  Axis: Normal axis  Intervals:nonspecific intraventricular conduction delay  ST&T Change: T-wave inversion in aVL. No ST segment elevation or depression.  T-wave inversion in aVL unchanged from EKG of 07/13/2014. ____________________________________________  RADIOLOGY  No active disease on the chest x-ray. ____________________________________________   PROCEDURES CRITICAL CARE Performed by: Doran Stabler   Total critical care time: 35 minutes  Critical care time was exclusive of separately billable procedures and treating other patients.  Critical care  was necessary to treat or prevent imminent or life-threatening deterioration.  Critical care was time spent personally by me on the following activities: development of treatment plan with patient and/or surrogate as well as nursing, discussions with consultants, evaluation of patient's response to treatment, examination of patient, obtaining history from patient or surrogate, ordering and performing treatments and interventions, ordering and review of laboratory studies, ordering and review of radiographic studies, pulse oximetry and re-evaluation of patient's condition. Critical care for 3 or more glucose  checks. ____________________________________________   INITIAL IMPRESSION / ASSESSMENT AND PLAN / ED COURSE  Pertinent labs & imaging results that were available during my care of the patient were reviewed by me and considered in my medical decision making (see chart for details).  ----------------------------------------- 10:53 PM on 05/12/2015 -----------------------------------------  Patient still with mild dizziness but improvement in symptoms and rising glucose level with a D5 drip. Unclear cause of the patient's hypoglycemia. Upon further conversation with the family the patient also had a drop of his sugar last night to the 40s with a normal diet. Patient to be admitted to the hospital for further workup. Able to wean down drip to 75 mL/h because the glucose has been steadily increasing. Discussed the plan for admission with the patient and the family and they understand and are willing to comply. Signed out to Dr. Jannifer Franklin. ____________________________________________   FINAL CLINICAL IMPRESSION(S) / ED DIAGNOSES  Refractory hypoglycemia.    Orbie Pyo, MD 05/12/15 [phone]

## 2015-05-12 NOTE — ED Notes (Signed)
CBG-215, stopped D5 in normal saline  Drip per Dr. Jannifer Franklin.

## 2015-05-13 LAB — CBC
HEMATOCRIT: 37.5 % — AB (ref 40.0–52.0)
HEMOGLOBIN: 12 g/dL — AB (ref 13.0–18.0)
MCH: 27.1 pg (ref 26.0–34.0)
MCHC: 31.9 g/dL — AB (ref 32.0–36.0)
MCV: 84.7 fL (ref 80.0–100.0)
Platelets: 212 10*3/uL (ref 150–440)
RBC: 4.42 MIL/uL (ref 4.40–5.90)
RDW: 16.2 % — ABNORMAL HIGH (ref 11.5–14.5)
WBC: 9.8 10*3/uL (ref 3.8–10.6)

## 2015-05-13 LAB — GLUCOSE, CAPILLARY
GLUCOSE-CAPILLARY: 265 mg/dL — AB (ref 65–99)
GLUCOSE-CAPILLARY: 241 mg/dL — AB (ref 65–99)
GLUCOSE-CAPILLARY: 260 mg/dL — AB (ref 65–99)
GLUCOSE-CAPILLARY: 286 mg/dL — AB (ref 65–99)
Glucose-Capillary: 146 mg/dL — ABNORMAL HIGH (ref 65–99)
Glucose-Capillary: 154 mg/dL — ABNORMAL HIGH (ref 65–99)
Glucose-Capillary: 175 mg/dL — ABNORMAL HIGH (ref 65–99)
Glucose-Capillary: 274 mg/dL — ABNORMAL HIGH (ref 65–99)
Glucose-Capillary: 307 mg/dL — ABNORMAL HIGH (ref 65–99)
Glucose-Capillary: 318 mg/dL — ABNORMAL HIGH (ref 65–99)
Glucose-Capillary: 356 mg/dL — ABNORMAL HIGH (ref 65–99)
Glucose-Capillary: 356 mg/dL — ABNORMAL HIGH (ref 65–99)

## 2015-05-13 LAB — BASIC METABOLIC PANEL
ANION GAP: 13 (ref 5–15)
BUN: 35 mg/dL — ABNORMAL HIGH (ref 6–20)
CO2: 24 mmol/L (ref 22–32)
Calcium: 8.1 mg/dL — ABNORMAL LOW (ref 8.9–10.3)
Chloride: 101 mmol/L (ref 101–111)
Creatinine, Ser: 1.72 mg/dL — ABNORMAL HIGH (ref 0.61–1.24)
GFR, EST AFRICAN AMERICAN: 44 mL/min — AB (ref >60)
GFR, EST NON AFRICAN AMERICAN: 38 mL/min — AB (ref >60)
GLUCOSE: 287 mg/dL — AB (ref 65–99)
POTASSIUM: 4.9 mmol/L (ref 3.5–5.1)
Sodium: 138 mmol/L (ref 135–145)

## 2015-05-13 LAB — TROPONIN I
Troponin I: <0.03 ng/mL (ref <0.031)
Troponin I: <0.03 ng/mL (ref <0.031)
Troponin I: <0.03 ng/mL (ref <0.031)

## 2015-05-13 MED ORDER — ACETAMINOPHEN 325 MG PO TABS: 650.0000 mg | ORAL_TABLET | Freq: Four times a day (QID) | ORAL | Status: DC | PRN

## 2015-05-13 MED ORDER — ONDANSETRON HCL 4 MG PO TABS: 4.0000 mg | ORAL_TABLET | Freq: Four times a day (QID) | ORAL | Status: DC | PRN

## 2015-05-13 MED ORDER — AMLODIPINE BESYLATE 5 MG PO TABS
5.0000 mg | ORAL_TABLET | Freq: Every day | ORAL | Status: DC
  Administered 2015-05-13 – 2015-05-14 (×2): 5 mg via ORAL
  Filled 2015-05-13 (×3): qty 1

## 2015-05-13 MED ORDER — INSULIN ASPART 100 UNIT/ML ~~LOC~~ SOLN
10.0000 [IU] | Freq: Three times a day (TID) | SUBCUTANEOUS | Status: DC
  Administered 2015-05-13 – 2015-05-14 (×3): 10 [IU] via SUBCUTANEOUS
  Filled 2015-05-13 (×2): qty 10

## 2015-05-13 MED ORDER — METOPROLOL TARTRATE 100 MG PO TABS
100.0000 mg | ORAL_TABLET | Freq: Two times a day (BID) | ORAL | Status: DC
  Administered 2015-05-13: 100 mg via ORAL
  Filled 2015-05-13: qty 1

## 2015-05-13 MED ORDER — APIXABAN 5 MG PO TABS
5.0000 mg | ORAL_TABLET | Freq: Two times a day (BID) | ORAL | Status: DC
  Administered 2015-05-13 – 2015-05-14 (×4): 5 mg via ORAL
  Filled 2015-05-13 (×6): qty 1

## 2015-05-13 MED ORDER — PANTOPRAZOLE SODIUM 40 MG PO TBEC
40.0000 mg | DELAYED_RELEASE_TABLET | Freq: Every day | ORAL | Status: DC
  Administered 2015-05-13 – 2015-05-14 (×2): 40 mg via ORAL
  Filled 2015-05-13 (×3): qty 1

## 2015-05-13 MED ORDER — INSULIN GLARGINE 100 UNIT/ML ~~LOC~~ SOLN
10.0000 [IU] | Freq: Every day | SUBCUTANEOUS | Status: DC
  Administered 2015-05-13: 10 [IU] via SUBCUTANEOUS
  Filled 2015-05-13 (×2): qty 0.1

## 2015-05-13 MED ORDER — MOMETASONE FURO-FORMOTEROL FUM 200-5 MCG/ACT IN AERO
2.0000 | INHALATION_SPRAY | Freq: Two times a day (BID) | RESPIRATORY_TRACT | Status: DC
  Administered 2015-05-13 – 2015-05-14 (×4): 2 via RESPIRATORY_TRACT
  Filled 2015-05-13: qty 8.8

## 2015-05-13 MED ORDER — TIOTROPIUM BROMIDE MONOHYDRATE 18 MCG IN CAPS
18.0000 ug | ORAL_CAPSULE | Freq: Every day | RESPIRATORY_TRACT | Status: DC
  Administered 2015-05-13 – 2015-05-14 (×2): 18 ug via RESPIRATORY_TRACT
  Filled 2015-05-13: qty 5

## 2015-05-13 MED ORDER — ASPIRIN EC 81 MG PO TBEC
81.0000 mg | DELAYED_RELEASE_TABLET | Freq: Every day | ORAL | Status: DC
  Administered 2015-05-13 – 2015-05-14 (×2): 81 mg via ORAL
  Filled 2015-05-13 (×3): qty 1

## 2015-05-13 MED ORDER — DEXTROSE-NACL 5-0.9 % IV SOLN: INTRAVENOUS | Status: DC

## 2015-05-13 MED ORDER — ONDANSETRON HCL 4 MG/2ML IJ SOLN
4.0000 mg | Freq: Four times a day (QID) | INTRAMUSCULAR | Status: DC | PRN
Start: 2015-05-13 — End: 2015-05-14

## 2015-05-13 MED ORDER — PRAVASTATIN SODIUM 20 MG PO TABS
20.0000 mg | ORAL_TABLET | Freq: Every day | ORAL | Status: DC
  Administered 2015-05-13 (×2): 20 mg via ORAL
  Filled 2015-05-13 (×2): qty 1

## 2015-05-13 MED ORDER — ALBUTEROL SULFATE (2.5 MG/3ML) 0.083% IN NEBU: 3.0000 mL | INHALATION_SOLUTION | RESPIRATORY_TRACT | Status: DC | PRN

## 2015-05-13 MED ORDER — ACETAMINOPHEN 650 MG RE SUPP: 650.0000 mg | Freq: Four times a day (QID) | RECTAL | Status: DC | PRN

## 2015-05-13 MED ORDER — INSULIN GLARGINE 100 UNIT/ML ~~LOC~~ SOLN
20.0000 [IU] | Freq: Every day | SUBCUTANEOUS | Status: DC
  Administered 2015-05-13 – 2015-05-14 (×2): 20 [IU] via SUBCUTANEOUS
  Filled 2015-05-13 (×2): qty 0.2

## 2015-05-13 MED ORDER — CITALOPRAM HYDROBROMIDE 20 MG PO TABS
20.0000 mg | ORAL_TABLET | Freq: Every day | ORAL | Status: DC
  Administered 2015-05-13 – 2015-05-14 (×2): 20 mg via ORAL
  Filled 2015-05-13 (×3): qty 1

## 2015-05-13 MED ORDER — INSULIN ASPART 100 UNIT/ML ~~LOC~~ SOLN: 0.0000 [IU] | Freq: Three times a day (TID) | SUBCUTANEOUS | Status: DC

## 2015-05-13 MED ORDER — SODIUM CHLORIDE 0.9 % IJ SOLN
3.0000 mL | Freq: Two times a day (BID) | INTRAMUSCULAR | Status: DC
  Administered 2015-05-13 (×2): 3 mL via INTRAVENOUS

## 2015-05-13 MED ORDER — INSULIN ASPART 100 UNIT/ML ~~LOC~~ SOLN
0.0000 [IU] | Freq: Three times a day (TID) | SUBCUTANEOUS | Status: DC
  Administered 2015-05-13 (×2): 5 [IU] via SUBCUTANEOUS
  Administered 2015-05-14: 2 [IU] via SUBCUTANEOUS
  Filled 2015-05-13: qty 1
  Filled 2015-05-13 (×2): qty 5
  Filled 2015-05-13: qty 2

## 2015-05-13 NOTE — Care Management Obs Status (Signed)
[facility] NOTIFICATION   Patient Details  Name: Ernest Mclean MRN: [id number] Date of Birth: 03-07-1941   Medicare Observation Status Notification Given:  Yes    Marshell Garfinkel, RN 05/13/2015, 8:01 AM

## 2015-05-13 NOTE — Progress Notes (Signed)
[facility] [place] NAME: Ernest Mclean    MR#:  [id number]  DATE OF BIRTH:  01-02-1941  SUBJECTIVE:  CHIEF COMPLAINT:   Chief Complaint  Patient presents with  . Hypoglycemia   - Patient with diabetes history on Lantus and NovoLog, also on metformin at home presents to the hospital secondary to hypoglycemic episodes. -Has been having hypoglycemia for the past couple of weeks. But it dropped down to 30s and he was not arousable. -Required D5 on admission, off the drip now. Sugars elevated without any medication.  REVIEW OF SYSTEMS:  Review of Systems  Constitutional: Negative for fever and chills.  HENT: Negative for ear discharge, ear pain and nosebleeds.   Eyes: Negative for blurred vision.  Respiratory: Negative for cough, shortness of breath and wheezing.   Cardiovascular: Negative for chest pain and palpitations.  Gastrointestinal: Negative for nausea, vomiting, abdominal pain, diarrhea and constipation.  Genitourinary: Negative for dysuria and urgency.  Musculoskeletal: Negative for myalgias.  Neurological: Negative for dizziness, sensory change, speech change, focal weakness, seizures and headaches.  Psychiatric/Behavioral: Negative for depression.    DRUG ALLERGIES:   Allergies  Allergen Reactions  . Hydralazine Other (See Comments)    tongue swollen and couldn't wake up ---- not positive it was this or a mix of this with something else or high sugar  . Penicillins Other (See Comments)    Passed out (at 74 yrs old) Has patient had a PCN reaction causing immediate rash, facial/tongue/throat swelling, SOB or lightheadedness with hypotension: No Has patient had a PCN reaction causing severe rash involving mucus membranes or skin necrosis: No Has patient had a PCN reaction that required hospitalization No Has patient had a PCN reaction occurring within the last 10 years: No If all of the above answers are "NO", then may  proceed with Cephalosporin use.   . Codeine Hives, Rash and Swelling    VITALS:  Blood pressure 127/46, pulse 80, temperature 98.6 F (37 C), temperature source Oral, resp. rate 18, weight 99.882 kg (220 lb 3.2 oz), SpO2 97 %.  PHYSICAL EXAMINATION:  Physical Exam  GENERAL:  74 y.o.-year-old patient lying in the bed with no acute distress.  EYES: Pupils equal, round, reactive to light and accommodation. No scleral icterus. Extraocular muscles intact.  HEENT: Head atraumatic, normocephalic. Oropharynx and nasopharynx clear.  NECK:  Supple, no jugular venous distention. No thyroid enlargement, no tenderness.  LUNGS: Normal breath sounds bilaterally, no wheezing, rales,rhonchi or crepitation. No use of accessory muscles of respiration.  CARDIOVASCULAR: S1, S2 normal. No murmurs, rubs, or gallops.  ABDOMEN: Soft, bruising on the abdomen noted from recent blunt trauma. There is also firm subcutaneous nodule on the right side lower quadrant from recent trauma. Nontender. nontender, nondistended. Bowel sounds present. No organomegaly or mass.  EXTREMITIES: No pedal edema, cyanosis, or clubbing.  NEUROLOGIC: Cranial nerves II through XII are intact. Muscle strength 5/5 in all extremities. Sensation intact. Gait not checked.  PSYCHIATRIC: The patient is alert and oriented x 3.  SKIN: No obvious rash, lesion, or ulcer.    LABORATORY PANEL:   CBC  Recent Labs Lab 05/13/15 0531  WBC 9.8  HGB 12.0*  HCT 37.5*  PLT 212   ------------------------------------------------------------------------------------------------------------------  Chemistries   Recent Labs Lab 05/12/15 2027 05/13/15 0531  NA 141 138  K 3.9 4.9  CL 104 101  CO2 29 24  GLUCOSE 57* 287*  BUN 38* 35*  CREATININE 1.63* 1.72*  CALCIUM 9.8 8.1*  AST 19  --   ALT 12*  --   ALKPHOS 120  --   BILITOT 0.5  --     ------------------------------------------------------------------------------------------------------------------  Cardiac Enzymes  Recent Labs Lab 05/13/15 0531  TROPONINI <0.03   ------------------------------------------------------------------------------------------------------------------  RADIOLOGY:  Dg Chest 1 View  05/12/2015  CLINICAL DATA:  74 year old male with hyperglycemia. No chest complaints. History of COPD and hypertension. EXAM: CHEST 1 VIEW COMPARISON:  Chest CT dated 11/02/2014 FINDINGS: Single-view of the chest does not demonstrate any focal consolidation. No pleural effusion or pneumothorax. Top-normal cardiac size. Median sternotomy wires. Osseous structures are grossly unremarkable. IMPRESSION: No active disease. Electronically Signed   By: Anner Crete M.D.   On: 05/12/2015 20:49    EKG:   Orders placed or performed during the hospital encounter of 05/12/15  . ED EKG  . ED EKG  . EKG 12-Lead  . EKG 12-Lead    ASSESSMENT AND PLAN:   74 y/o M with past medical history significant for insulin-dependent diabetes mellitus, atrial flutter/fibrillation on eliquis, obstructive sleep apnea on CPAP, COPD, hypertension and CK D presents to the hospital secondary to low blood sugars.  #1 hypoglycemia-in a diabetic. -Patient has a Lantus regimen for a strict sugar control. According to his wife, patient has had hypoglycemic episodes several times in the last 2 weeks but never this low. -He sugars are elevated now in the 300s, he was on D5 until late last night. -We'll start low-dose Lantus at 20 units this morning, continue NovoLog 3 times a day at the lower dose. Continue sliding scale insulin. -Hold oral metformin. -Endocrinology consult-Dr. Gabriel Carina will see the patient tomorrow. -Appreciate diabetes coordinator input -A1c is pending  #2 issue fibrillation/flutter history-heart rate as low as into the 30s and 40s. -Remains in a flutter with heart rate of  60s this morning. -Hold metoprolol. -Continue eliquis for anticoagulation  #3 hypertension-on Norvasc. Metoprolol is on hold.  #4 depression and anxiety-continue Celexa  #5 COPD and sleep apnea-stable. Continue home inhalers. -CPAP at bedtime  #6 DVT prophylaxis-patient on eliquis   Ambulate later today. Possible discharge tomorrow     All the records are reviewed and case discussed with Care Management/Social Workerr. Management plans discussed with the patient, family and they are in agreement.  CODE STATUS: Full Code  TOTAL TIME TAKING CARE OF THIS PATIENT: 40 minutes.   POSSIBLE D/C IN 1-2 DAYS, DEPENDING ON CLINICAL CONDITION.   Gladstone Lighter M.D on 05/13/2015 at 10:23 AM  Between 7am to 6pm - Pager - [phone]  After 6pm go to [email or web address] - password [email or web address] [facility]  Office  [phone]  CC: Primary care physician; Juluis Pitch, MD

## 2015-05-13 NOTE — Plan of Care (Signed)
Problem: Education: Goal: Knowledge of [facility] Education information/materials will improve Outcome: Progressing Talk to patient and wife about the importance of managing the patient's blood sugar and eating a low NA diet.

## 2015-05-13 NOTE — Progress Notes (Signed)
Inpatient Diabetes Program Recommendations  AACE/ADA: New Consensus Statement on Inpatient Glycemic Control (2015)  Target Ranges:  Prepandial:   less than 140 mg/dL      Peak postprandial:   less than 180 mg/dL (1-2 hours)      Critically ill patients:  140 - 180 mg/dL   Review of Glycemic Control  Results for Ernest Mclean, Ernest Mclean (MRN [id number]) as of 05/13/2015 08:56  Ref. Range 05/13/2015 00:23 05/13/2015 02:05 05/13/2015 04:28 05/13/2015 06:06 05/13/2015 08:01  Glucose-Capillary Latest Ref Range: 65-99 mg/dL 356 (H) 356 (H) 318 (H) 307 (H) 265 (H)    Diabetes history: Type 2 Outpatient Diabetes medications: Metformin 500bid, Lantus 47 units qhs if blood sugars 100mg /dl, Lantus 95 units if blood sugar > 100mg /dl, Novolog 22 units qam, 25 units at lunch and 35 units with supper. (confirmed with patient) Current orders for Inpatient glycemic control: Novolog 0-9 units tid with meals  Inpatient Diabetes Program Recommendations: Recommend giving Novolog correction as ordered.  Awaiting Dr. Joycie Peek recommendations for medication- Dr. Tressia Miners has paged her.   Gentry Fitz, RN, BA, MHA, CDE Diabetes Coordinator Inpatient Diabetes Program  [phone] (Team Pager) [phone] ([place]) 05/13/2015 9:03 AM

## 2015-05-13 NOTE — Progress Notes (Signed)
Received  Call from Dr Tressia Miners for lantus 10 u sq at bedtime

## 2015-05-13 NOTE — ED Notes (Signed)
Pt desat to 84% on room air, placed on 2L [place], SpO2-96%. Pt and wife report that is on 2L [place] O2 when sleeping.

## 2015-05-13 NOTE — Care Management Note (Signed)
Case Management Note  Patient Details  Name: Ernest Mclean MRN: [id number] Date of Birth: 12-09-1940  Subjective/Objective:                  Met with patient and his wife to discuss discharge planning. Wife is concerned that patient blood sugar dropping and does not want his to "leave this soon". His PCP is Dr. Lovie Macadamia. His pharmacy is [facility] [address]. He is on O2 at night through [place]. They would like to use [facility] for home health. He uses a cane to ambulate but also has a walker.   Action/Plan: List of home health agencies left with patient. Referral made to [facility] with [place] care. RNCM will continue to follow.   Expected Discharge Date:                  Expected Discharge Plan:     In-House Referral:     Discharge planning Services  CM Consult  Post Acute Care Choice:  Home Health Choice offered to:  Patient  DME Arranged:    DME Agency:     HH Arranged:  RN, PT [place] Agency:  [place]  Status of Service:  In process, will continue to follow  Medicare Important Message Given:    Date Medicare IM Given:    Medicare IM give by:    Date Additional Medicare IM Given:    Additional Medicare Important Message give by:     If discussed at [place] of Stay Meetings, dates discussed:    Additional Comments:  Marshell Garfinkel, RN 05/13/2015, 11:32 AM

## 2015-05-14 LAB — GLUCOSE, CAPILLARY
GLUCOSE-CAPILLARY: 209 mg/dL — AB (ref 65–99)
GLUCOSE-CAPILLARY: 214 mg/dL — AB (ref 65–99)
Glucose-Capillary: 198 mg/dL — ABNORMAL HIGH (ref 65–99)
Glucose-Capillary: 205 mg/dL — ABNORMAL HIGH (ref 65–99)
Glucose-Capillary: 195 mg/dL — ABNORMAL HIGH (ref 65–99)
Glucose-Capillary: 210 mg/dL — ABNORMAL HIGH (ref 65–99)

## 2015-05-14 LAB — HEMOGLOBIN A1C: HEMOGLOBIN A1C: 7.9 % — AB (ref 4.0–6.0)

## 2015-05-14 MED ORDER — LANTUS 100 UNIT/ML ~~LOC~~ SOLN: 35.0000 [IU] | Freq: Every day | SUBCUTANEOUS | Status: DC

## 2015-05-14 MED ORDER — NOVOLOG 100 UNIT/ML ~~LOC~~ SOLN: 15.0000 [IU] | Freq: Three times a day (TID) | SUBCUTANEOUS | Status: DC

## 2015-05-14 MED ORDER — NOVOLOG 100 UNIT/ML ~~LOC~~ SOLN: 22.0000 [IU] | Freq: Three times a day (TID) | SUBCUTANEOUS | Status: DC

## 2015-05-14 MED ORDER — LANTUS 100 UNIT/ML ~~LOC~~ SOLN: 75.0000 [IU] | Freq: Every day | SUBCUTANEOUS | Status: DC

## 2015-05-14 NOTE — Discharge Summary (Signed)
[place] at [place] NAME: Ernest Mclean    MR#:  [id number]  DATE OF BIRTH:  September 16, 1940  DATE OF ADMISSION:  05/12/2015 ADMITTING PHYSICIAN: Lance Coon, MD  DATE OF DISCHARGE: 05/14/2015  2:20 PM  PRIMARY CARE PHYSICIAN: Juluis Pitch, MD    ADMISSION DIAGNOSIS:  Hypoglycemia [E16.2]  DISCHARGE DIAGNOSIS:  Principal Problem:   Hypoglycemia Active Problems:   OSA on CPAP   COPD (chronic obstructive pulmonary disease) (HCC)   Type 2 diabetes mellitus (HCC)   HTN (hypertension)   CAD (coronary artery disease)   Atrial flutter (HCC)   GERD (gastroesophageal reflux disease)   SECONDARY DIAGNOSIS:   Past Medical History  Diagnosis Date  . Diabetes ([place])   . Hypertension   . COPD (chronic obstructive pulmonary disease) ([place])   . Congestive heart failure ([place])   . OSA on CPAP   . Atrial fibrillation ([place])   . CKD (chronic kidney disease)   . CAD (coronary artery disease)     HOSPITAL COURSE:   74 y/o M with past medical history significant for insulin-dependent diabetes mellitus, atrial flutter/fibrillation on eliquis, obstructive sleep apnea on CPAP, COPD, hypertension and CK D presents to the hospital secondary to low blood sugars.  #1 hypoglycemia-in a diabetic. -lantus and novolog and metformin were held initially, patient received D5 fluids. - once D5 was discontinued, he was started on lantus at a lower dose and his sugars remained in 200's - Appreciate endocrinology consult. - patient is being discharged on adjusted doses of lantus- 75 units at bedtime and 22 units novolog tid prior to meals. -strict monitoring, outpatient endocrinology follow up recommended  #2 issue fibrillation/flutter history-heart rate improved - restart metoprolol. -Continue eliquis for anticoagulation  #3 hypertension-on Norvasc. Also on metoprolol at home For known h/o A fib  #4 depression and anxiety-continue Celexa  #5 COPD  and sleep apnea-stable. Continue home inhalers. -CPAP at bedtime  Ambulated well, discharge today  DISCHARGE CONDITIONS:   Stable  CONSULTS OBTAINED:  Treatment Team:  Judi Cong, MD  DRUG ALLERGIES:   Allergies  Allergen Reactions  . Hydralazine Other (See Comments)    tongue swollen and couldn't wake up ---- not positive it was this or a mix of this with something else or high sugar  . Penicillins Other (See Comments)    Passed out (at 74 yrs old) Has patient had a PCN reaction causing immediate rash, facial/tongue/throat swelling, SOB or lightheadedness with hypotension: No Has patient had a PCN reaction causing severe rash involving mucus membranes or skin necrosis: No Has patient had a PCN reaction that required hospitalization No Has patient had a PCN reaction occurring within the last 10 years: No If all of the above answers are "NO", then may proceed with Cephalosporin use.   . Codeine Hives, Rash and Swelling    DISCHARGE MEDICATIONS:   Discharge Medication List as of 05/14/2015  1:45 PM    CONTINUE these medications which have CHANGED   Details  LANTUS 100 UNIT/ML injection Inject 0.35 mLs (35 Units total) into the skin at bedtime., Starting 05/14/2015, Until Discontinued, Normal    NOVOLOG 100 UNIT/ML injection Inject 15 Units into the skin 3 (three) times daily with meals., Starting 05/14/2015, Until Discontinued, Normal      CONTINUE these medications which have NOT CHANGED   Details  albuterol (PROVENTIL HFA;VENTOLIN HFA) 108 (90 BASE) MCG/ACT inhaler Inhale 2 puffs into the lungs every 4 (four) hours as  needed for wheezing or shortness of breath., Starting 10/29/2014, Until Discontinued, Normal    amLODipine (NORVASC) 5 MG tablet Take 5 mg by mouth daily. , Starting 08/17/2014, Until Discontinued, Historical Med    apixaban (ELIQUIS) 5 MG TABS tablet Take 5 mg by mouth 2 (two) times daily., Starting 08/17/2014, Until Discontinued, Historical Med    aspirin  EC 81 MG tablet Take 81 mg by mouth daily., Starting 08/17/2014, Until Discontinued, Historical Med    citalopram (CELEXA) 20 MG tablet Take 20 mg by mouth at bedtime. , Starting 07/30/2014, Until Discontinued, Historical Med    CRANBERRY PO Take 820 mg by mouth daily., Until Discontinued, Historical Med    cyanocobalamin 500 MCG tablet Take 500 mcg by mouth daily., Until Discontinued, Historical Med    diazepam (VALIUM) 5 MG tablet Take 5 mg by mouth every 12 (twelve) hours as needed for anxiety. , Starting 10/24/2013, Until Discontinued, Historical Med    Fluticasone-Salmeterol (ADVAIR DISKUS) 500-50 MCG/DOSE AEPB Inhale 1 puff into the lungs 2 (two) times daily., Starting 10/29/2014, Until Discontinued, Normal    furosemide (LASIX) 20 MG tablet Take 20 mg by mouth daily., Until Discontinued, Historical Med    lisinopril (PRINIVIL,ZESTRIL) 5 MG tablet Take 5 mg by mouth daily., Until Discontinued, Historical Med    metFORMIN (GLUCOPHAGE-XR) 500 MG 24 hr tablet Take 2 tablets by mouth daily with supper., Starting 03/11/2015, Until Discontinued, Historical Med    metoprolol (LOPRESSOR) 100 MG tablet Take 100 mg by mouth 2 (two) times daily., Starting 06/17/2014, Until Discontinued, Historical Med    OMEGA-3 FATTY ACIDS-VITAMIN E PO Take 1 capsule by mouth daily., Until Discontinued, Historical Med    omeprazole (PRILOSEC) 20 MG capsule Take 20 mg by mouth daily., Starting 08/07/2014, Until Discontinued, Historical Med    OXYGEN Inhale 2 L into the lungs at bedtime., Until Discontinued, Historical Med    pravastatin (PRAVACHOL) 20 MG tablet Take 20 mg by mouth at bedtime., Starting 07/31/2014, Until Discontinued, Historical Med    tiotropium (SPIRIVA) 18 MCG inhalation capsule Place 18 mcg into inhaler and inhale daily., Starting 07/31/2014, Until Discontinued, Historical Med    Vitamin D, Ergocalciferol, (DRISDOL) 50000 UNITS CAPS capsule Take 1 capsule by mouth every 30 (thirty) days. , Starting  08/12/2014, Until Discontinued, Historical Med         DISCHARGE INSTRUCTIONS:   1. PCP f/u in 1-2 weeks 2. Endocrinology f/u in 1 week  If you experience worsening of your admission symptoms, develop shortness of breath, life threatening emergency, suicidal or homicidal thoughts you must seek medical attention immediately by calling 911 or calling your MD immediately  if symptoms less severe.  You Must read complete instructions/literature along with all the possible adverse reactions/side effects for all the Medicines you take and that have been prescribed to you. Take any new Medicines after you have completely understood and accept all the possible adverse reactions/side effects.   Please note  You were cared for by a hospitalist during your hospital stay. If you have any questions about your discharge medications or the care you received while you were in the hospital after you are discharged, you can call the unit and asked to speak with the hospitalist on call if the hospitalist that took care of you is not available. Once you are discharged, your primary care physician will handle any further medical issues. Please note that NO REFILLS for any discharge medications will be authorized once you are discharged, as it is imperative that  you return to your primary care physician (or establish a relationship with a primary care physician if you do not have one) for your aftercare needs so that they can reassess your need for medications and monitor your lab values.    Today   CHIEF COMPLAINT:   Chief Complaint  Patient presents with  . Hypoglycemia    VITAL SIGNS:  Blood pressure 124/67, pulse 95, temperature 98.1 F (36.7 C), temperature source Oral, resp. rate 18, height 6' (1.829 m), weight 99.882 kg (220 lb 3.2 oz), SpO2 99 %.  I/O:   Intake/Output Summary (Last 24 hours) at 05/14/15 1656 Last data filed at 05/14/15 1125  Gross per 24 hour  Intake    480 ml  Output      0  ml  Net    480 ml    PHYSICAL EXAMINATION:   Physical Exam  GENERAL: 74 y.o.-year-old patient lying in the bed with no acute distress.  EYES: Pupils equal, round, reactive to light and accommodation. No scleral icterus. Extraocular muscles intact.  HEENT: Head atraumatic, normocephalic. Oropharynx and nasopharynx clear.  NECK: Supple, no jugular venous distention. No thyroid enlargement, no tenderness.  LUNGS: Normal breath sounds bilaterally, no wheezing, rales,rhonchi or crepitation. No use of accessory muscles of respiration.  CARDIOVASCULAR: S1, S2 normal. No murmurs, rubs, or gallops.  ABDOMEN: Soft, bruising on the abdomen noted from recent blunt trauma. There is also firm subcutaneous nodule on the right side lower quadrant from recent trauma. Nontender. nontender, nondistended. Bowel sounds present. No organomegaly or mass.  EXTREMITIES: No pedal edema, cyanosis, or clubbing.  NEUROLOGIC: Cranial nerves II through XII are intact. Muscle strength 5/5 in all extremities. Sensation intact. Gait not checked.  PSYCHIATRIC: The patient is alert and oriented x 3.  SKIN: No obvious rash, lesion, or ulcer.   DATA REVIEW:   CBC  Recent Labs Lab 05/13/15 0531  WBC 9.8  HGB 12.0*  HCT 37.5*  PLT 212    Chemistries   Recent Labs Lab 05/12/15 2027 05/13/15 0531  NA 141 138  K 3.9 4.9  CL 104 101  CO2 29 24  GLUCOSE 57* 287*  BUN 38* 35*  CREATININE 1.63* 1.72*  CALCIUM 9.8 8.1*  AST 19  --   ALT 12*  --   ALKPHOS 120  --   BILITOT 0.5  --     Cardiac Enzymes  Recent Labs Lab 05/13/15 1253  TROPONINI <0.03    Microbiology Results  Results for orders placed or performed in visit on 06/22/14  Culture, blood (single)     Status: None   Collection Time: 06/22/14  2:51 PM  Result Value Ref Range Status   Micro Text Report   Final       COMMENT                   NO GROWTH AEROBICALLY/ANAEROBICALLY IN 5 DAYS   ANTIBIOTIC                                                       Culture, blood (single)     Status: None   Collection Time: 06/22/14  2:51 PM  Result Value Ref Range Status   Micro Text Report   Final       COMMENT  NO GROWTH AEROBICALLY/ANAEROBICALLY IN 5 DAYS   ANTIBIOTIC                                                        RADIOLOGY:  Dg Chest 1 View  05/12/2015  CLINICAL DATA:  74 year old male with hyperglycemia. No chest complaints. History of COPD and hypertension. EXAM: CHEST 1 VIEW COMPARISON:  Chest CT dated 11/02/2014 FINDINGS: Single-view of the chest does not demonstrate any focal consolidation. No pleural effusion or pneumothorax. Top-normal cardiac size. Median sternotomy wires. Osseous structures are grossly unremarkable. IMPRESSION: No active disease. Electronically Signed   By: Anner Crete M.D.   On: 05/12/2015 20:49    EKG:   Orders placed or performed during the hospital encounter of 05/12/15  . ED EKG  . ED EKG  . EKG 12-Lead  . EKG 12-Lead      Management plans discussed with the patient, family and they are in agreement.  CODE STATUS:     Code Status Orders        Start     Ordered   05/13/15 0051  Full code   Continuous     05/13/15 0050      TOTAL TIME TAKING CARE OF THIS PATIENT: 37 minutes.    Gladstone Lighter M.D on 05/14/2015 at 4:56 PM  Between 7am to 6pm - Pager - [phone]  After 6pm go to [email or web address] - password [email or web address] [facility]  Office  [phone]  CC: Primary care physician; Juluis Pitch, MD

## 2015-05-14 NOTE — Progress Notes (Signed)
Dr Gabriel Carina on rounds. md to chg  novolog to 22 units tid with meals and lantus to 75 units at bedtime. Pt to be discharged home via w/c

## 2015-05-14 NOTE — Care Management Note (Signed)
Case Management Note  Patient Details  Name: GERSHOM VENIER MRN: [id number] Date of Birth: 06/29/1940  Subjective/Objective:  Discharge order in. Spoke with patient and he states he is feeling good. Discussed home health and patient does not feel like he need it at this time. Notified [facility] of discharge plan. No additional needs identified. Case Closed.                   Action/Plan:   Expected Discharge Date:                  Expected Discharge Plan:     In-House Referral:     Discharge planning Services  CM Consult  Post Acute Care Choice:  Home Health Choice offered to:  Patient  DME Arranged:    DME Agency:     HH Arranged:  RN, PT [place] Agency:  [place]  Status of Service:  In process, will continue to follow  Medicare Important Message Given:    Date Medicare IM Given:    Medicare IM give by:    Date Additional Medicare IM Given:    Additional Medicare Important Message give by:     If discussed at [place] of Stay Meetings, dates discussed:    Additional Comments:  Jolly Mango, RN 05/14/2015, 11:10 AM

## 2015-05-14 NOTE — Consult Note (Signed)
ENDOCRINOLOGY CONSULTATION  REFERRING PHYSICIAN: Gladstone Lighter, MD CONSULTING PHYSICIAN:  A. Lavone Orn, MD.  CHIEF COMPLAINT:  Diabetes mellitus  HISTORY OF PRESENT ILLNESS:  Ernest Mclean is a 74 y.o. male seen in consultation for hypoglycemia in setting of long-standing diabetes mellitus, on insulin therapy. Patient is well known to me from clinic. Last Hgb A1c on 12/22 was 7.9% and a prior A1c on 10/25 was 7.4%. Diabetes has been controlled. Diabetes is complicated by peripheral neuropathy, nephropathy with stage 3 CKD and Charcot arthropathy.  Two nights ago after supper, for which he took 30 units NovoLog, he had a low into the 40s. Wife attempted to treat this with apple juice and various snacks and due to persistent hypoglycemia and worsening weakness, she called EMS. Initial FSBS was 38 and he was given oral glucose. Repeat BG was 40 and after a second treatment and persistent hypoglycemia, he was brought to the Ed. He was started on IV dextrose. No further hypoglycemia. Yesterday was restarted on subQ insulin. Over last 24 hrs, FSBS has been in the range of 146 - 286 and he has received in total 92 units of insulin.   Wife is at the bedside and she assists with the history. Over the last 2 weeks he has had several other espidoes of hypoglycemia which they treated without incident. Sugars had been as low as the 50s. Most commonly, lows are either post-supper or overnight. He did have one low after breakfast last week. They cannot any recent changes in his diet. No changes in activity. No new medications. Metformin was added in 02/2015.   Outpatient diabetes regimen includes: metformin 500 mg bid, Lantus 95 units qhs (or half dose of 47 units if bedtime sugar <100), and Humalog 22 units before breakfast, 25 units before lunch, and 35 units before supper + SSI.  PAST MEDICAL HISTORY:  1. Diabetes mellitus type 2       A. Onset  1980s       B. Last eye exam 05/2014 2. Diabetic nephropathy.  3. Microalbuminuria.  4. Stage III chronic kidney disease. - follows with Dr. Juleen China. 5. Diabetic peripheral neuropathy       A. Charcot arthropathy       B. Diabetic foot ulceration - follows with Dr. Cleda Mccreedy 5. ASCVD.  6. Peripheral arterial disease.  7. Hypertension.  8. Hyperlipidemia.  9. Peripheral vascular disease.  10. GERD.  11. Obstructive sleep apnea.  12. Anxiety/depressive disorder.   PAST SURGICAL HISTORY:  1. CABG.  2. Hernia repair.  3. Cataract repair.   SOCIAL HISTORY:  Married. Former smoker, quit in 2003. No alcohol use.   FAMILY HISTORY:   Family History  Problem Relation Age of Onset  . Stroke    . Diabetes    . Breast cancer    . Colon cancer       ALLERGIES:  Allergies  Allergen Reactions  . Hydralazine Other (See Comments)    tongue swollen and couldn't wake up ---- not positive it was this or a mix of this with something else or high sugar  . Penicillins Other (See Comments)    Passed out (at 74 yrs old) Has patient had a PCN reaction causing immediate rash, facial/tongue/throat swelling, SOB or lightheadedness with hypotension: No Has patient had a PCN reaction causing severe rash involving mucus membranes or skin necrosis: No Has patient had a PCN reaction that required hospitalization No Has patient had a PCN reaction occurring within the last 10  years: No If all of the above answers are "NO", then may proceed with Cephalosporin use.   . Codeine Hives, Rash and Swelling    REVIEW OF SYSTEMS:  GENERAL:  No weight loss.  No fever.  HEENT:  No blurred vision. No sore throat.  NECK:  No neck pain or dysphagia.  CARDIAC:  No chest pain or palpitation.  PULMONARY:  No cough or shortness of breath.  ABDOMEN:  No abdominal pain.  No constipation. EXTREMITIES:  No lower extremity swelling.  ENDOCRINE:  No heat or cold intolerance.  GENITOURINARY:  No dysuria or  hematuria. SKIN:  No recent rash or skin changes.   PHYSICAL EXAMINATION:  BP 124/67 mmHg  Pulse 95  Temp(Src) 98.1 F (36.7 C) (Oral)  Resp 18  Ht 6' (1.829 m)  Wt 99.882 kg (220 lb 3.2 oz)  BMI 29.86 kg/m2  SpO2 99%  GENERAL:  Well-developed white male in NAD. HEENT:  EOMI.  Oropharynx is clear.  NECK:  Supple.  No thyromegaly.  No neck tenderness.  CARDIAC:  Regular rate and rhythm without murmur.  PULMONARY:  Clear to auscultation bilaterally.  ABDOMEN:  Diffusely soft, nontender, nondistended.  EXTREMITIES:  No peripheral edema is present.    SKIN:  No rash or dermatopathy. NEUROLOGIC:  No dysarthria.  No tremor. PSYCHIATRIC:  Alert and oriented, calm, cooperative.   LABORATORY DATA:  Results for orders placed or performed during the hospital encounter of 05/12/15 (from the past 24 hour(s))  Glucose, capillary     Status: Abnormal   Collection Time: 05/13/15  3:42 PM  Result Value Ref Range   Glucose-Capillary 286 (H) 65 - 99 mg/dL  Glucose, capillary     Status: Abnormal   Collection Time: 05/13/15  6:21 PM  Result Value Ref Range   Glucose-Capillary 154 (H) 65 - 99 mg/dL   Comment 1 Notify RN   Glucose, capillary     Status: Abnormal   Collection Time: 05/13/15  8:49 PM  Result Value Ref Range   Glucose-Capillary 146 (H) 65 - 99 mg/dL  Glucose, capillary     Status: Abnormal   Collection Time: 05/13/15  9:53 PM  Result Value Ref Range   Glucose-Capillary 175 (H) 65 - 99 mg/dL  Glucose, capillary     Status: Abnormal   Collection Time: 05/14/15 12:08 AM  Result Value Ref Range   Glucose-Capillary 209 (H) 65 - 99 mg/dL  Glucose, capillary     Status: Abnormal   Collection Time: 05/14/15  2:55 AM  Result Value Ref Range   Glucose-Capillary 198 (H) 65 - 99 mg/dL  Glucose, capillary     Status: Abnormal   Collection Time: 05/14/15  4:32 AM  Result Value Ref Range   Glucose-Capillary 205 (H) 65 - 99 mg/dL  Glucose, capillary     Status: Abnormal   Collection  Time: 05/14/15  6:22 AM  Result Value Ref Range   Glucose-Capillary 214 (H) 65 - 99 mg/dL  Glucose, capillary     Status: Abnormal   Collection Time: 05/14/15  8:04 AM  Result Value Ref Range   Glucose-Capillary 195 (H) 65 - 99 mg/dL  Glucose, capillary     Status: Abnormal   Collection Time: 05/14/15 11:24 AM  Result Value Ref Range   Glucose-Capillary 210 (H) 65 - 99 mg/dL     ASSESSMENT:  1. Diabetes mellitus type 2 with severe hypoglycemia without coma 2.   Diabetic nephropathy with stage 3 CKD 3.   Diabetic  peripheral neuropathy 4.   Diabetes mellitus with Charcot arthropathy 5.   Long-term use of insulin  PLAN: 1. Relatively new (2 weeks) hypoglycemia in individual on long-term insulin and long-term metformin, without any apparent provoking cause. Not clear why he has had hypoglycemia.  2. Recommended he continue metformin. 3. Reduce Lantus insulin from 95 units qhs to 75 units qhs. 4. Reduce Humalog to 22 units tid AC. Take 10 min before eating. 5. Continue low carb diet. 6. Check sugars qACHS.   Anticipate patient will see me in follow up in 2-4 weeks, as scheduled.  Thank you for the kind request for consultation.

## 2015-05-14 NOTE — Progress Notes (Signed)
Dr Tressia Miners to reconcilled meds on original form. Pt able to verbalize med chg correctly. Left via w/c

## 2015-06-09 ENCOUNTER — Telehealth: Payer: Self-pay | Admitting: *Deleted

## 2015-06-09 DIAGNOSIS — J449 Chronic obstructive pulmonary disease, unspecified: Secondary | ICD-10-CM

## 2015-06-09 NOTE — Telephone Encounter (Signed)
BMET placed for CT scan

## 2015-07-09 ENCOUNTER — Inpatient Hospital Stay
Admission: EM | Admit: 2015-07-09 | Discharge: 2015-07-11 | DRG: 291 | Disposition: A | Discharge status: Home / Self Care | Payer: Medicare Other | Attending: Internal Medicine | Admitting: Internal Medicine

## 2015-07-09 ENCOUNTER — Encounter: Payer: Self-pay | Admitting: Emergency Medicine

## 2015-07-09 ENCOUNTER — Inpatient Hospital Stay
Admit: 2015-07-09 | Discharge: 2015-07-09 | Disposition: A | Discharge status: Home / Self Care | Payer: Medicare Other | Attending: Internal Medicine | Admitting: Internal Medicine

## 2015-07-09 ENCOUNTER — Emergency Department: Payer: Medicare Other

## 2015-07-09 DIAGNOSIS — I4892 Unspecified atrial flutter: Secondary | ICD-10-CM | POA: Diagnosis present

## 2015-07-09 DIAGNOSIS — J189 Pneumonia, unspecified organism: Secondary | ICD-10-CM | POA: Diagnosis present

## 2015-07-09 DIAGNOSIS — E669 Obesity, unspecified: Secondary | ICD-10-CM | POA: Diagnosis present

## 2015-07-09 DIAGNOSIS — N183 Chronic kidney disease, stage 3 (moderate): Secondary | ICD-10-CM | POA: Diagnosis present

## 2015-07-09 DIAGNOSIS — Z7982 Long term (current) use of aspirin: Secondary | ICD-10-CM

## 2015-07-09 DIAGNOSIS — E1122 Type 2 diabetes mellitus with diabetic chronic kidney disease: Secondary | ICD-10-CM | POA: Diagnosis present

## 2015-07-09 DIAGNOSIS — Z6829 Body mass index (BMI) 29.0-29.9, adult: Secondary | ICD-10-CM

## 2015-07-09 DIAGNOSIS — Z87891 Personal history of nicotine dependence: Secondary | ICD-10-CM

## 2015-07-09 DIAGNOSIS — J440 Chronic obstructive pulmonary disease with acute lower respiratory infection: Secondary | ICD-10-CM | POA: Diagnosis present

## 2015-07-09 DIAGNOSIS — E785 Hyperlipidemia, unspecified: Secondary | ICD-10-CM | POA: Diagnosis present

## 2015-07-09 DIAGNOSIS — K219 Gastro-esophageal reflux disease without esophagitis: Secondary | ICD-10-CM | POA: Diagnosis present

## 2015-07-09 DIAGNOSIS — Z9981 Dependence on supplemental oxygen: Secondary | ICD-10-CM

## 2015-07-09 DIAGNOSIS — G4733 Obstructive sleep apnea (adult) (pediatric): Secondary | ICD-10-CM | POA: Diagnosis present

## 2015-07-09 DIAGNOSIS — Z803 Family history of malignant neoplasm of breast: Secondary | ICD-10-CM

## 2015-07-09 DIAGNOSIS — I5033 Acute on chronic diastolic (congestive) heart failure: Secondary | ICD-10-CM | POA: Diagnosis present

## 2015-07-09 DIAGNOSIS — I130 Hypertensive heart and chronic kidney disease with heart failure and stage 1 through stage 4 chronic kidney disease, or unspecified chronic kidney disease: Principal | ICD-10-CM | POA: Diagnosis present

## 2015-07-09 DIAGNOSIS — E871 Hypo-osmolality and hyponatremia: Secondary | ICD-10-CM | POA: Diagnosis present

## 2015-07-09 DIAGNOSIS — R778 Other specified abnormalities of plasma proteins: Secondary | ICD-10-CM

## 2015-07-09 DIAGNOSIS — Z951 Presence of aortocoronary bypass graft: Secondary | ICD-10-CM

## 2015-07-09 DIAGNOSIS — R0902 Hypoxemia: Secondary | ICD-10-CM

## 2015-07-09 DIAGNOSIS — I4891 Unspecified atrial fibrillation: Secondary | ICD-10-CM | POA: Diagnosis present

## 2015-07-09 DIAGNOSIS — R0600 Dyspnea, unspecified: Secondary | ICD-10-CM

## 2015-07-09 DIAGNOSIS — I2510 Atherosclerotic heart disease of native coronary artery without angina pectoris: Secondary | ICD-10-CM | POA: Diagnosis present

## 2015-07-09 DIAGNOSIS — Z823 Family history of stroke: Secondary | ICD-10-CM

## 2015-07-09 DIAGNOSIS — Z794 Long term (current) use of insulin: Secondary | ICD-10-CM

## 2015-07-09 DIAGNOSIS — Z800 Family history of malignant neoplasm of digestive organs: Secondary | ICD-10-CM

## 2015-07-09 DIAGNOSIS — J810 Acute pulmonary edema: Secondary | ICD-10-CM | POA: Diagnosis present

## 2015-07-09 DIAGNOSIS — R7989 Other specified abnormal findings of blood chemistry: Secondary | ICD-10-CM

## 2015-07-09 DIAGNOSIS — Z833 Family history of diabetes mellitus: Secondary | ICD-10-CM

## 2015-07-09 DIAGNOSIS — Z7901 Long term (current) use of anticoagulants: Secondary | ICD-10-CM | POA: Diagnosis not present

## 2015-07-09 DIAGNOSIS — I509 Heart failure, unspecified: Secondary | ICD-10-CM

## 2015-07-09 LAB — BASIC METABOLIC PANEL
ANION GAP: 9 (ref 5–15)
BUN: 34 mg/dL — AB (ref 6–20)
CHLORIDE: 93 mmol/L — AB (ref 101–111)
CO2: 26 mmol/L (ref 22–32)
Calcium: 8.3 mg/dL — ABNORMAL LOW (ref 8.9–10.3)
Creatinine, Ser: 1.78 mg/dL — ABNORMAL HIGH (ref 0.61–1.24)
GFR, EST AFRICAN AMERICAN: 42 mL/min — AB (ref >60)
GFR, EST NON AFRICAN AMERICAN: 36 mL/min — AB (ref >60)
Glucose, Bld: 310 mg/dL — ABNORMAL HIGH (ref 65–99)
POTASSIUM: 4.9 mmol/L (ref 3.5–5.1)
Sodium: 128 mmol/L — ABNORMAL LOW (ref 135–145)

## 2015-07-09 LAB — CBC
HEMATOCRIT: 36.5 % — AB (ref 40.0–52.0)
Hemoglobin: 12 g/dL — ABNORMAL LOW (ref 13.0–18.0)
MCH: 27.4 pg (ref 26.0–34.0)
MCHC: 32.9 g/dL (ref 32.0–36.0)
MCV: 83.3 fL (ref 80.0–100.0)
Platelets: 140 10*3/uL — ABNORMAL LOW (ref 150–440)
RBC: 4.37 MIL/uL — AB (ref 4.40–5.90)
RDW: 15.5 % — AB (ref 11.5–14.5)
WBC: 9.9 10*3/uL (ref 3.8–10.6)

## 2015-07-09 LAB — URINALYSIS COMPLETE WITH MICROSCOPIC (ARMC ONLY)
Bilirubin Urine: NEGATIVE
Glucose, UA: 150 mg/dL — AB
Hgb urine dipstick: NEGATIVE
Ketones, ur: NEGATIVE mg/dL
NITRITE: NEGATIVE
PROTEIN: 30 mg/dL — AB
SPECIFIC GRAVITY, URINE: 1.009 (ref 1.005–1.030)
pH: 5 (ref 5.0–8.0)

## 2015-07-09 LAB — TROPONIN I
TROPONIN I: 0.08 ng/mL — AB (ref <0.031)
TROPONIN I: 0.09 ng/mL — AB (ref <0.031)
Troponin I: 0.06 ng/mL — ABNORMAL HIGH (ref <0.031)

## 2015-07-09 LAB — GLUCOSE, CAPILLARY
GLUCOSE-CAPILLARY: 278 mg/dL — AB (ref 65–99)
Glucose-Capillary: 244 mg/dL — ABNORMAL HIGH (ref 65–99)
Glucose-Capillary: 166 mg/dL — ABNORMAL HIGH (ref 65–99)

## 2015-07-09 LAB — BRAIN NATRIURETIC PEPTIDE: B Natriuretic Peptide: 456 pg/mL — ABNORMAL HIGH (ref 0.0–100.0)

## 2015-07-09 MED ORDER — METOPROLOL TARTRATE 100 MG PO TABS
100.0000 mg | ORAL_TABLET | Freq: Two times a day (BID) | ORAL | Status: DC
  Administered 2015-07-09 – 2015-07-11 (×4): 100 mg via ORAL
  Filled 2015-07-09 (×4): qty 1

## 2015-07-09 MED ORDER — SODIUM CHLORIDE 0.9% FLUSH
3.0000 mL | Freq: Two times a day (BID) | INTRAVENOUS | Status: DC
  Administered 2015-07-09 – 2015-07-11 (×4): 3 mL via INTRAVENOUS

## 2015-07-09 MED ORDER — LEVOFLOXACIN IN D5W 500 MG/100ML IV SOLN
500.0000 mg | Freq: Once | INTRAVENOUS | Status: AC
  Administered 2015-07-09: 500 mg via INTRAVENOUS
  Filled 2015-07-09: qty 100

## 2015-07-09 MED ORDER — MOMETASONE FURO-FORMOTEROL FUM 200-5 MCG/ACT IN AERO
2.0000 | INHALATION_SPRAY | Freq: Two times a day (BID) | RESPIRATORY_TRACT | Status: DC
Start: 2015-07-09 — End: 2015-07-11
  Administered 2015-07-09 – 2015-07-11 (×4): 2 via RESPIRATORY_TRACT
  Filled 2015-07-09: qty 8.8

## 2015-07-09 MED ORDER — ACETAMINOPHEN 650 MG RE SUPP: 650.0000 mg | Freq: Four times a day (QID) | RECTAL | Status: DC | PRN

## 2015-07-09 MED ORDER — CYANOCOBALAMIN 500 MCG PO TABS
500.0000 ug | ORAL_TABLET | Freq: Every day | ORAL | Status: DC
  Administered 2015-07-09 – 2015-07-11 (×3): 500 ug via ORAL
  Filled 2015-07-09 (×4): qty 1

## 2015-07-09 MED ORDER — FUROSEMIDE 10 MG/ML IJ SOLN
40.0000 mg | Freq: Once | INTRAMUSCULAR | Status: AC
  Administered 2015-07-09: 40 mg via INTRAVENOUS
  Filled 2015-07-09: qty 4

## 2015-07-09 MED ORDER — CRANBERRY 1000 MG PO CAPS: 820.0000 mg | ORAL_CAPSULE | Freq: Every day | ORAL | Status: DC

## 2015-07-09 MED ORDER — IPRATROPIUM-ALBUTEROL 0.5-2.5 (3) MG/3ML IN SOLN
3.0000 mL | RESPIRATORY_TRACT | Status: DC
  Administered 2015-07-09 – 2015-07-10 (×3): 3 mL via RESPIRATORY_TRACT
  Filled 2015-07-09 (×3): qty 3

## 2015-07-09 MED ORDER — PRAVASTATIN SODIUM 20 MG PO TABS
20.0000 mg | ORAL_TABLET | Freq: Every day | ORAL | Status: DC
  Administered 2015-07-09 – 2015-07-10 (×2): 20 mg via ORAL
  Filled 2015-07-09 (×2): qty 1

## 2015-07-09 MED ORDER — AMLODIPINE BESYLATE 5 MG PO TABS: 5.0000 mg | ORAL_TABLET | Freq: Every day | ORAL | Status: DC

## 2015-07-09 MED ORDER — ASPIRIN EC 81 MG PO TBEC: 81.0000 mg | DELAYED_RELEASE_TABLET | Freq: Every day | ORAL | Status: DC

## 2015-07-09 MED ORDER — INSULIN ASPART 100 UNIT/ML ~~LOC~~ SOLN
12.0000 [IU] | Freq: Three times a day (TID) | SUBCUTANEOUS | Status: DC
  Administered 2015-07-09 – 2015-07-11 (×6): 12 [IU] via SUBCUTANEOUS
  Filled 2015-07-09 (×6): qty 12

## 2015-07-09 MED ORDER — METFORMIN HCL ER 500 MG PO TB24
1000.0000 mg | ORAL_TABLET | Freq: Every day | ORAL | Status: DC
  Administered 2015-07-09 – 2015-07-10 (×2): 1000 mg via ORAL
  Filled 2015-07-09 (×2): qty 2

## 2015-07-09 MED ORDER — FUROSEMIDE 10 MG/ML IJ SOLN
40.0000 mg | Freq: Two times a day (BID) | INTRAMUSCULAR | Status: DC
  Administered 2015-07-10 – 2015-07-11 (×3): 40 mg via INTRAVENOUS
  Filled 2015-07-09 (×3): qty 4

## 2015-07-09 MED ORDER — LEVOFLOXACIN IN D5W 250 MG/50ML IV SOLN
250.0000 mg | INTRAVENOUS | Status: DC
  Administered 2015-07-10: 250 mg via INTRAVENOUS
  Filled 2015-07-09 (×2): qty 50

## 2015-07-09 MED ORDER — ASPIRIN EC 81 MG PO TBEC
81.0000 mg | DELAYED_RELEASE_TABLET | Freq: Every day | ORAL | Status: DC
  Administered 2015-07-10 – 2015-07-11 (×2): 81 mg via ORAL
  Filled 2015-07-09 (×2): qty 1

## 2015-07-09 MED ORDER — AMLODIPINE BESYLATE 5 MG PO TABS
5.0000 mg | ORAL_TABLET | Freq: Every day | ORAL | Status: DC
  Administered 2015-07-10 – 2015-07-11 (×2): 5 mg via ORAL
  Filled 2015-07-09 (×2): qty 1

## 2015-07-09 MED ORDER — PANTOPRAZOLE SODIUM 40 MG PO TBEC
40.0000 mg | DELAYED_RELEASE_TABLET | Freq: Every day | ORAL | Status: DC
  Administered 2015-07-10 – 2015-07-11 (×2): 40 mg via ORAL
  Filled 2015-07-09 (×2): qty 1

## 2015-07-09 MED ORDER — DIAZEPAM 5 MG PO TABS
5.0000 mg | ORAL_TABLET | Freq: Two times a day (BID) | ORAL | Status: DC | PRN
Start: 2015-07-09 — End: 2015-07-11

## 2015-07-09 MED ORDER — INSULIN GLARGINE 100 UNIT/ML ~~LOC~~ SOLN
75.0000 [IU] | Freq: Every day | SUBCUTANEOUS | Status: DC
  Administered 2015-07-10 (×2): 75 [IU] via SUBCUTANEOUS
  Filled 2015-07-09 (×5): qty 0.75

## 2015-07-09 MED ORDER — ACETAMINOPHEN 325 MG PO TABS
650.0000 mg | ORAL_TABLET | Freq: Four times a day (QID) | ORAL | Status: DC | PRN
  Administered 2015-07-09: 650 mg via ORAL
  Filled 2015-07-09: qty 2

## 2015-07-09 MED ORDER — LISINOPRIL 5 MG PO TABS: 5.0000 mg | ORAL_TABLET | Freq: Every day | ORAL | Status: DC

## 2015-07-09 MED ORDER — FUROSEMIDE 10 MG/ML IJ SOLN: 40.0000 mg | Freq: Two times a day (BID) | INTRAMUSCULAR | Status: DC

## 2015-07-09 MED ORDER — TIOTROPIUM BROMIDE MONOHYDRATE 18 MCG IN CAPS
18.0000 ug | ORAL_CAPSULE | Freq: Every day | RESPIRATORY_TRACT | Status: DC
  Administered 2015-07-10 – 2015-07-11 (×2): 18 ug via RESPIRATORY_TRACT
  Filled 2015-07-09: qty 5

## 2015-07-09 MED ORDER — ONDANSETRON HCL 4 MG PO TABS: 4.0000 mg | ORAL_TABLET | Freq: Four times a day (QID) | ORAL | Status: DC | PRN

## 2015-07-09 MED ORDER — CITALOPRAM HYDROBROMIDE 20 MG PO TABS
20.0000 mg | ORAL_TABLET | Freq: Every day | ORAL | Status: DC
  Administered 2015-07-09 – 2015-07-10 (×2): 20 mg via ORAL
  Filled 2015-07-09 (×2): qty 1

## 2015-07-09 MED ORDER — APIXABAN 5 MG PO TABS
5.0000 mg | ORAL_TABLET | Freq: Two times a day (BID) | ORAL | Status: DC
  Administered 2015-07-09 – 2015-07-11 (×4): 5 mg via ORAL
  Filled 2015-07-09 (×4): qty 1

## 2015-07-09 MED ORDER — PANTOPRAZOLE SODIUM 40 MG PO TBEC: 40.0000 mg | DELAYED_RELEASE_TABLET | Freq: Every day | ORAL | Status: DC

## 2015-07-09 MED ORDER — ONDANSETRON HCL 4 MG/2ML IJ SOLN: 4.0000 mg | Freq: Four times a day (QID) | INTRAMUSCULAR | Status: DC | PRN

## 2015-07-09 NOTE — H&P (Signed)
[facility] [place] NAME: Ernest Mclean    MR#:  [id number]  DATE OF BIRTH:  Mar 29, 1941  DATE OF ADMISSION:  07/09/2015  PRIMARY CARE PHYSICIAN: Juluis Pitch, MD   REQUESTING/REFERRING PHYSICIAN: Dr. Lenise Arena  CHIEF COMPLAINT:   Chief Complaint  Patient presents with  . Shortness of Breath    HISTORY OF PRESENT ILLNESS:  Ernest Mclean  is a 75 y.o. 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 Patient started having symptoms of shortness of breath and also fluid buildup with weight gain going on for the last 3 days. He follows a strict low sodium diet and also very compliant with his medications. He started having low-grade fevers for the last couple of days with worsening breathing and also cough but no productive phlegm. Went to see his PCP yesterday was started on oral Levaquin after the chest x-ray showed pneumonia. He was advised to come to the emergency room if his symptoms got worse. He continued to spike temperatures of 101.33F, nausea and vomiting associated with worsening shortness of breath and so presented to the emergency room. Chest x-ray here reveals bibasilar pneumonia as well as congestive heart failure exacerbation. He is being admitted for the same. He was only using 2 L oxygen at bedtime, remained hypoxic with 87% sats on 2 L.  PAST MEDICAL HISTORY:   Past Medical History  Diagnosis Date  . Diabetes ([place])   . Hypertension   . COPD (chronic obstructive pulmonary disease) ([place])   . Congestive heart failure ([place])   . OSA on CPAP   . Atrial fibrillation ([place])   . CKD (chronic kidney disease)   . CAD (coronary artery disease)     PAST SURGICAL HISTORY:   Past Surgical History  Procedure Laterality Date  . Heart bypass    . Hernia  repair    . Clavicle surgery      SOCIAL HISTORY:   Social History  Substance Use Topics  . Smoking status: Former Smoker -- 1.00 packs/day for 30 years    Types: Cigarettes  . Smokeless tobacco: Never Used     Comment: quit 12/14/2001  . Alcohol Use: No    FAMILY HISTORY:   Family History  Problem Relation Age of Onset  . Stroke    . Diabetes    . Breast cancer    . Colon cancer      DRUG ALLERGIES:   Allergies  Allergen Reactions  . Hydralazine Other (See Comments)    tongue swollen and couldn't wake up ---- not positive it was this or a mix of this with something else or high sugar  . Penicillins Other (See Comments)    Passed out (at 75 yrs old) Has patient had a PCN reaction causing immediate rash, facial/tongue/throat swelling, SOB or lightheadedness with hypotension: No Has patient had a PCN reaction causing severe rash involving mucus membranes or skin necrosis: No Has patient had a PCN reaction that required hospitalization No Has patient had a PCN reaction occurring within the last 10 years: No If all of the above answers are "NO", then may proceed with Cephalosporin use.   . Codeine Hives, Rash and Swelling    REVIEW OF SYSTEMS:   Review of Systems  Constitutional: Positive for fever, chills and malaise/fatigue. Negative for weight loss.  HENT: Negative for ear discharge, ear pain, hearing loss and nosebleeds.   Eyes: Negative for blurred vision, double vision and photophobia.  Respiratory: Positive for cough and shortness of breath. Negative for hemoptysis and wheezing.   Cardiovascular: Positive for orthopnea. Negative for chest pain, palpitations and leg swelling.  Gastrointestinal: Positive for nausea and vomiting. Negative for heartburn, abdominal pain, diarrhea, constipation and melena.  Genitourinary: Negative for dysuria, urgency and frequency.  Musculoskeletal: Positive for myalgias. Negative for back pain and neck pain.  Skin: Negative for rash.   Neurological: Negative for dizziness, tingling, sensory change, speech change, focal weakness and headaches.  Endo/Heme/Allergies: Does not bruise/bleed easily.  Psychiatric/Behavioral: Negative for depression.    MEDICATIONS AT HOME:   Prior to Admission medications   Medication Sig Start Date End Date Taking? Authorizing Provider  albuterol (PROVENTIL HFA;VENTOLIN HFA) 108 (90 BASE) MCG/ACT inhaler Inhale 2 puffs into the lungs every 4 (four) hours as needed for wheezing or shortness of breath. 10/29/14   Vishal Mungal, MD  amLODipine (NORVASC) 5 MG tablet Take 5 mg by mouth daily.  08/17/14   Historical Provider, MD  apixaban (ELIQUIS) 5 MG TABS tablet Take 5 mg by mouth 2 (two) times daily. 08/17/14   Historical Provider, MD  aspirin EC 81 MG tablet Take 81 mg by mouth daily. 08/17/14   Historical Provider, MD  citalopram (CELEXA) 20 MG tablet Take 20 mg by mouth at bedtime.  07/30/14   Historical Provider, MD  CRANBERRY PO Take 820 mg by mouth daily.    Historical Provider, MD  cyanocobalamin 500 MCG tablet Take 500 mcg by mouth daily.    Historical Provider, MD  diazepam (VALIUM) 5 MG tablet Take 5 mg by mouth every 12 (twelve) hours as needed for anxiety.  10/24/13   Historical Provider, MD  Fluticasone-Salmeterol (ADVAIR DISKUS) 500-50 MCG/DOSE AEPB Inhale 1 puff into the lungs 2 (two) times daily. 10/29/14   Vishal Mungal, MD  furosemide (LASIX) 20 MG tablet Take 20 mg by mouth daily.    Historical Provider, MD  LANTUS 100 UNIT/ML injection Inject 0.75 mLs (75 Units total) into the skin at bedtime. 05/14/15   Gladstone Lighter, MD  lisinopril (PRINIVIL,ZESTRIL) 5 MG tablet Take 5 mg by mouth daily.    Historical Provider, MD  metFORMIN (GLUCOPHAGE-XR) 500 MG 24 hr tablet Take 2 tablets by mouth daily with supper. 03/11/15   Historical Provider, MD  metoprolol (LOPRESSOR) 100 MG tablet Take 100 mg by mouth 2 (two) times daily. 06/17/14   Historical Provider, MD  NOVOLOG 100 UNIT/ML injection  Inject 22 Units into the skin 3 (three) times daily with meals. 05/14/15   Gladstone Lighter, MD  OMEGA-3 FATTY ACIDS-VITAMIN E PO Take 1 capsule by mouth daily.    Historical Provider, MD  omeprazole (PRILOSEC) 20 MG capsule Take 20 mg by mouth daily. 08/07/14   Historical Provider, MD  OXYGEN Inhale 2 L into the lungs at bedtime.    Historical Provider, MD  pravastatin (PRAVACHOL) 20 MG tablet Take 20 mg by mouth at bedtime. 07/31/14   Historical Provider, MD  tiotropium (SPIRIVA) 18 MCG inhalation capsule Place 18 mcg into inhaler and inhale daily. 07/31/14   Historical Provider, MD  Vitamin D, Ergocalciferol, (DRISDOL) 50000 UNITS CAPS capsule Take 1 capsule by mouth every 30 (thirty) days.  08/12/14   Historical Provider, MD      VITAL SIGNS:  Blood pressure 146/53, pulse 125, temperature 99.1 F (37.3 C), temperature source Oral, resp. rate 30,  height 6' (1.829 m), weight 98.884 kg (218 lb), SpO2 96 %.  PHYSICAL EXAMINATION:   Physical Exam  GENERAL:  75 y.o.-year-old patient lying in the bed with no acute distress. Appears tachypneic EYES: Pupils equal, round, reactive to light and accommodation. No scleral icterus. Extraocular muscles intact.  HEENT: Head atraumatic, normocephalic. Oropharynx and nasopharynx clear.  NECK:  Supple, no jugular venous distention. No thyroid enlargement, no tenderness.  LUNGS: Normal breath sounds bilaterally, some scattered wheezing at the bases and some rhonchi and also rales. . No use of accessory muscles of respiration.  CARDIOVASCULAR: S1, S2 normal. No rubs, or gallops. 3/6 systolic murmur ABDOMEN: Soft, nontender, nondistended. Bowel sounds present. No organomegaly or mass.  EXTREMITIES: No pedal edema, cyanosis, or clubbing.  NEUROLOGIC: Cranial nerves II through XII are intact. Muscle strength 5/5 in all extremities. Sensation intact. Gait not checked.  PSYCHIATRIC: The patient is alert and oriented x 3.  SKIN: No obvious rash, lesion, or ulcer.    LABORATORY PANEL:   CBC  Recent Labs Lab 07/09/15 1105  WBC 9.9  HGB 12.0*  HCT 36.5*  PLT 140*   ------------------------------------------------------------------------------------------------------------------  Chemistries   Recent Labs Lab 07/09/15 1105  NA 128*  K 4.9  CL 93*  CO2 26  GLUCOSE 310*  BUN 34*  CREATININE 1.78*  CALCIUM 8.3*   ------------------------------------------------------------------------------------------------------------------  Cardiac Enzymes  Recent Labs Lab 07/09/15 1105  TROPONINI 0.06*   ------------------------------------------------------------------------------------------------------------------  RADIOLOGY:  Dg Chest 2 View  07/09/2015  CLINICAL DATA:  Previous pneumonia. Fever with weakness and shortness of breath. EXAM: CHEST  2 VIEW COMPARISON:  07/12/2014 FINDINGS: Post median sternotomy. Enlarged interstitial lung markings bilaterally, particularly on the right side. Heart size is within normal limits. No large pleural effusions. The trachea is midline. Old right rib fractures. IMPRESSION: Prominent interstitial lung densities bilaterally, right side greater than left. Differential diagnosis includes asymmetric pulmonary edema versus atypical pneumonia. Electronically Signed   By: Markus Daft M.D.   On: 07/09/2015 11:45    EKG:   Orders placed or performed during the hospital encounter of 07/09/15  . EKG 12-Lead  . EKG 12-Lead  . ED EKG within 10 minutes  . ED EKG within 10 minutes  . ED EKG  . ED EKG    IMPRESSION AND PLAN:   Ernest Mclean  is a 75 y.o. Mclean with a known history of CAD status post CABG, COPD on 2 L home oxygen, congestive heart failure with diastolic dysfunction, CKD, insulin-dependent diabetes mellitus brought from home secondary to worsening dyspnea.  #1 acute hypoxic respiratory failure- secondary to CHF exacerbation and pneumonia - o2 support for now  #2 CHF exacerbation-acute on chronic  diastolic dysfunction - ECHO with EF 55%- from last year - cards consulted, IV lasix BID - repeat ECHO  #3 community-acquired pneumonia- Blood cultures and continue levaquin  #4 CKD-baseline creatinine seems to be around 1.6, creatinine today at 1.7. Could be cardiorenal with his CHF. -Also sodium low from his CHF. Continue diuresis with IV Lasix and continue to monitor -Hold nephrotoxins  #5 diabetes mellitus-follows with endocrinologist as outpatient. Continue Lantus at bedtime and also NovoLog prior to meals. Also on sliding scale insulin. Also on metformin  #6 hypertension-continue home medications-on metoprolol, lisinopril and Norvasc  #7 COPD-scattered wheezing likely secondary to underlying pneumonia and also CHF. Hold off on starting systemic steroids. Continue inhalers and also on DuoNeb's. -Continue oxygen support  #8 DVT prophylaxis-already on eliquis  #9 atrial fibrillation-controlled. Continue metoprolol. Also  on eliquis for anticoagulation  Physical therapy consulted  All the records are reviewed and case discussed with ED provider. Management plans discussed with the patient, family and they are in agreement.  CODE STATUS: Full Code  TOTAL TIME TAKING CARE OF THIS PATIENT: 50 minutes.    Gladstone Lighter M.D on 07/09/2015 at 2:10 PM  Between 7am to 6pm - Pager - [phone]  After 6pm go to [email or web address] - password [email or web address] [facility]  Office  [phone]  CC: Primary care physician; Juluis Pitch, MD

## 2015-07-09 NOTE — ED Notes (Signed)
Pt started with shortness of breath on Monday. Oxygen level today and yesterday was 87% on 2L [place].  Dependent on oxygen at night. Dx yesterday with PNA on CXR. Started on Levaquin yesterday and did take a dose today. Pt reports feels short of breath at rest and worse when lying down. Dyspnea on exertion as well.

## 2015-07-09 NOTE — Consult Note (Signed)
Reason for Consult: Shortness of breath dyspnea pneumonia heart failure A. Fib Referring Physician: Dr. Lovie Macadamia, Dr. Tressia Miners hospitalist  Ernest Mclean is an 75 y.o. male.  HPI: Patient presents with several-day history of shortness of breath dyspnea fatigue hypoxemia renal insufficiency diabetes. Patient states she's been having worsening dyspnea congestion denies any fever chills or sweats however. Patient has had a temperature of 101.5. Continue supplemental oxygen for hypoxemia. Chest x-ray suggested significant pneumonia acetaminophen further evaluation and care as well as COPD management. Patient denies palpitations tachycardia complains of generalized weakness shortness of breath. Patient denies significant chest pain at this point no blackout spells syncope  Past Medical History  Diagnosis Date  . Diabetes ([place])   . Hypertension   . COPD (chronic obstructive pulmonary disease) ([place])   . Congestive heart failure ([place])   . OSA on CPAP   . Atrial fibrillation ([place])   . CKD (chronic kidney disease)   . CAD (coronary artery disease)     Past Surgical History  Procedure Laterality Date  . Heart bypass    . Hernia repair    . Clavicle surgery      Family History  Problem Relation Age of Onset  . Stroke    . Diabetes    . Breast cancer    . Colon cancer      Social History:  reports that he has quit smoking. His smoking use included Cigarettes. He has a 30 pack-year smoking history. He has never used smokeless tobacco. He reports that he does not drink alcohol or use illicit drugs.  Allergies:  Allergies  Allergen Reactions  . Hydralazine Other (See Comments)    tongue swollen and couldn't wake up ---- not positive it was this or a mix of this with something else or high sugar  . Penicillins Other (See Comments)    Passed out (at 75 yrs old) Has patient had a PCN reaction causing immediate rash, facial/tongue/throat swelling, SOB or lightheadedness with hypotension:  No Has patient had a PCN reaction causing severe rash involving mucus membranes or skin necrosis: No Has patient had a PCN reaction that required hospitalization No Has patient had a PCN reaction occurring within the last 10 years: No If all of the above answers are "NO", then may proceed with Cephalosporin use.   . Codeine Hives, Rash and Swelling    Medications: I have reviewed the patient's current medications.  Results for orders placed or performed during the hospital encounter of 07/09/15 (from the past 48 hour(s))  Basic metabolic panel     Status: Abnormal   Collection Time: 07/09/15 11:05 AM  Result Value Ref Range   Sodium 128 (L) 135 - 145 mmol/L   Potassium 4.9 3.5 - 5.1 mmol/L   Chloride 93 (L) 101 - 111 mmol/L   CO2 26 22 - 32 mmol/L   Glucose, Bld 310 (H) 65 - 99 mg/dL   BUN 34 (H) 6 - 20 mg/dL   Creatinine, Ser 1.78 (H) 0.61 - 1.24 mg/dL   Calcium 8.3 (L) 8.9 - 10.3 mg/dL   GFR calc non Af Amer 36 (L) >60 mL/min   GFR calc Af Amer 42 (L) >60 mL/min    Comment: (NOTE) The eGFR has been calculated using the CKD EPI equation. This calculation has not been validated in all clinical situations. eGFR's persistently <60 mL/min signify possible Chronic Kidney Disease.    Anion gap 9 5 - 15  CBC     Status: Abnormal  Collection Time: 07/09/15 11:05 AM  Result Value Ref Range   WBC 9.9 3.8 - 10.6 K/uL   RBC 4.37 (L) 4.40 - 5.90 MIL/uL   Hemoglobin 12.0 (L) 13.0 - 18.0 g/dL   HCT 36.5 (L) 40.0 - 52.0 %   MCV 83.3 80.0 - 100.0 fL   MCH 27.4 26.0 - 34.0 pg   MCHC 32.9 32.0 - 36.0 g/dL   RDW 15.5 (H) 11.5 - 14.5 %   Platelets 140 (L) 150 - 440 K/uL  Troponin I     Status: Abnormal   Collection Time: 07/09/15 11:05 AM  Result Value Ref Range   Troponin I 0.06 (H) <0.031 ng/mL    Comment: READ BACK AND VERIFIED WITH MARY NEEDHAM @ 2703 07/09/15 BY [facility]        PERSISTENTLY INCREASED TROPONIN VALUES IN THE RANGE OF 0.04-0.49 ng/mL CAN BE SEEN IN:       -UNSTABLE  ANGINA       -CONGESTIVE HEART FAILURE       -MYOCARDITIS       -CHEST TRAUMA       -ARRYHTHMIAS       -LATE PRESENTING MYOCARDIAL INFARCTION       -COPD   CLINICAL FOLLOW-UP RECOMMENDED.   Brain natriuretic peptide     Status: Abnormal   Collection Time: 07/09/15 11:05 AM  Result Value Ref Range   B Natriuretic Peptide 456.0 (H) 0.0 - 100.0 pg/mL  Glucose, capillary     Status: Abnormal   Collection Time: 07/09/15 12:43 PM  Result Value Ref Range   Glucose-Capillary 278 (H) 65 - 99 mg/dL  Urinalysis complete, with microscopic ([facility] only)     Status: Abnormal   Collection Time: 07/09/15  3:32 PM  Result Value Ref Range   Color, Urine YELLOW (A) YELLOW   APPearance CLEAR (A) CLEAR   Glucose, UA 150 (A) NEGATIVE mg/dL   Bilirubin Urine NEGATIVE NEGATIVE   Ketones, ur NEGATIVE NEGATIVE mg/dL   Specific Gravity, Urine 1.009 1.005 - 1.030   Hgb urine dipstick NEGATIVE NEGATIVE   pH 5.0 5.0 - 8.0   Protein, ur 30 (A) NEGATIVE mg/dL   Nitrite NEGATIVE NEGATIVE   Leukocytes, UA 3+ (A) NEGATIVE   RBC / HPF 0-5 0 - 5 RBC/hpf   WBC, UA 6-30 0 - 5 WBC/hpf   Bacteria, UA RARE (A) NONE SEEN   Squamous Epithelial / LPF 0-5 (A) NONE SEEN   Mucous PRESENT   Troponin I     Status: Abnormal   Collection Time: 07/09/15  4:50 PM  Result Value Ref Range   Troponin I 0.08 (H) <0.031 ng/mL    Comment: PREVIOUS RESULT CALLED AT 1154 07/09/2015 BY [facility].  TFK        PERSISTENTLY INCREASED TROPONIN VALUES IN THE RANGE OF 0.04-0.49 ng/mL CAN BE SEEN IN:       -UNSTABLE ANGINA       -CONGESTIVE HEART FAILURE       -MYOCARDITIS       -CHEST TRAUMA       -ARRYHTHMIAS       -LATE PRESENTING MYOCARDIAL INFARCTION       -COPD   CLINICAL FOLLOW-UP RECOMMENDED.   Glucose, capillary     Status: Abnormal   Collection Time: 07/09/15  5:03 PM  Result Value Ref Range   Glucose-Capillary 244 (H) 65 - 99 mg/dL   Comment 1 Notify RN     Dg Chest 2 View  07/09/2015  CLINICAL  DATA:  Previous  pneumonia. Fever with weakness and shortness of breath. EXAM: CHEST  2 VIEW COMPARISON:  07/12/2014 FINDINGS: Post median sternotomy. Enlarged interstitial lung markings bilaterally, particularly on the right side. Heart size is within normal limits. No large pleural effusions. The trachea is midline. Old right rib fractures. IMPRESSION: Prominent interstitial lung densities bilaterally, right side greater than left. Differential diagnosis includes asymmetric pulmonary edema versus atypical pneumonia. Electronically Signed   By: Markus Daft M.D.   On: 07/09/2015 11:45    Review of Systems  Constitutional: Positive for malaise/fatigue.  HENT: Positive for congestion.   Eyes: Negative.   Respiratory: Positive for cough, shortness of breath and wheezing.   Cardiovascular: Positive for palpitations, orthopnea and leg swelling.  Gastrointestinal: Negative.   Genitourinary: Negative.   Musculoskeletal: Negative.   Neurological: Positive for weakness.  Endo/Heme/Allergies: Negative.   Psychiatric/Behavioral: Negative.    Blood pressure 147/61, pulse 87, temperature 98.4 F (36.9 C), temperature source Oral, resp. rate 22, height 6' (1.829 m), weight 97.433 kg (214 lb 12.8 oz), SpO2 92 %. Physical Exam  Vitals reviewed. Constitutional: He is oriented to person, place, and time. He appears well-developed and well-nourished.  HENT:  Head: Normocephalic and atraumatic.  Eyes: Conjunctivae and EOM are normal. Pupils are equal, round, and reactive to light.  Neck: Normal range of motion. Neck supple.  Cardiovascular: Normal rate and regular rhythm.  Exam reveals gallop.   Murmur heard. Respiratory: He has decreased breath sounds.  GI: Soft. Bowel sounds are normal.  Musculoskeletal: Normal range of motion. He exhibits edema.  Neurological: He is alert and oriented to person, place, and time. He has normal reflexes.  Skin: Skin is warm and dry.  Psychiatric: He has a normal mood and affect. His  behavior is normal.    Assessment/Plan: Congestive heart failure acute on chronic Obstructive sleep apnea Shortness of breath Community-acquired pneumonia Diabetes type 2 Coronary artery disease Atrial flutter GERD Obesity COPD . PLAN Supplemental oxygen therapy Recommend inhalers to help his shortness of breath Continue Eliquis for anticoagulation for A. fib and flutter Diuretic therapy for heart failure Continue metoprolol  amlodipine for hypertension and heart failure rate control for atrial flutter Continue Pravachol for lipid management Agree with Protonix therapy for reflux symptoms CPAP supplemental oxygen for obstructive sleep apnea Agree with antibiotic therapy for pneumonia or bronchitis Recommend pulmonary input for COPD bronchitis pneumonia Agree with echocardiogram for assessment of shortness of breath possible heart failure  Broughton Eppinger D. 07/09/2015, 7:12 PM

## 2015-07-09 NOTE — Progress Notes (Signed)
PHARMACIST - PHYSICIAN ORDER COMMUNICATION  CONCERNING: P&T Medication Policy on Herbal Medications  DESCRIPTION:  This patient's order for:  Cranberry Caps has been noted.  This product(s) is classified as an "herbal" or natural product. Due to a lack of definitive safety studies or FDA approval, nonstandard manufacturing practices, plus the potential risk of unknown drug-drug interactions while on inpatient medications, the Pharmacy and Therapeutics Committee does not permit the use of "herbal" or natural products of this type within [place].   ACTION TAKEN: The pharmacy department is unable to verify this order at this time and your patient has been informed of this safety policy. Please reevaluate patient's clinical condition at discharge and address if the herbal or natural product(s) should be resumed at that time.  

## 2015-07-09 NOTE — Consult Note (Signed)
Pharmacy Antibiotic Note  Ernest Mclean is a 75 y.o. male admitted on 07/09/2015 with pneumonia.  Pharmacy has been consulted for levofloxacin dosing.  Plan: Will initiate levofloxacin 500mg  IV x1, followed by levofloxacin 250mg  IV q24hrs.  Pharmacy will continue to monitor for changes in renal function that would qualify for a dose change.  Height: 6' (182.9 cm) Weight: 218 lb (98.884 kg) IBW/kg (Calculated) : 77.6  Temp (24hrs), Avg:99.1 F (37.3 C), Min:99.1 F (37.3 C), Max:99.1 F (37.3 C)   Recent Labs Lab 07/09/15 1105  WBC 9.9  CREATININE 1.78*    Estimated Creatinine Clearance: 45 mL/min (by C-G formula based on Cr of 1.78).    Allergies  Allergen Reactions  . Hydralazine Other (See Comments)    tongue swollen and couldn't wake up ---- not positive it was this or a mix of this with something else or high sugar  . Penicillins Other (See Comments)    Passed out (at 75 yrs old) Has patient had a PCN reaction causing immediate rash, facial/tongue/throat swelling, SOB or lightheadedness with hypotension: No Has patient had a PCN reaction causing severe rash involving mucus membranes or skin necrosis: No Has patient had a PCN reaction that required hospitalization No Has patient had a PCN reaction occurring within the last 10 years: No If all of the above answers are "NO", then may proceed with Cephalosporin use.   . Codeine Hives, Rash and Swelling    Antimicrobials this admission: Anti-infectives    None      Microbiology results: Results for orders placed or performed in visit on 06/22/14  Culture, blood (single)     Status: None   Collection Time: 06/22/14  2:51 PM  Result Value Ref Range Status   Micro Text Report   Final       COMMENT                   NO GROWTH AEROBICALLY/ANAEROBICALLY IN 5 DAYS   ANTIBIOTIC                                                      Culture, blood (single)     Status: None   Collection Time: 06/22/14  2:51 PM  Result  Value Ref Range Status   Micro Text Report   Final       COMMENT                   NO GROWTH AEROBICALLY/ANAEROBICALLY IN 5 DAYS   ANTIBIOTIC                                                        Thank you for allowing pharmacy to be a part of this patient's care.  Vena Rua 07/09/2015 4:15 PM

## 2015-07-09 NOTE — ED Provider Notes (Signed)
[facility] Emergency Department Provider Note     Time seen: ----------------------------------------- 11:24 AM on 07/09/2015 -----------------------------------------    I have reviewed the triage vital signs and the nursing notes.   HISTORY  Chief Complaint Shortness of Breath    HPI Ernest Mclean is a 75 y.o. male who presents to ER with shortness of breath since Monday. Oxygen level today and yesterday was 87% on 2 L nasal cannula. Patient is normally only dependent on oxygen at night. He was diagnosed yesterday with pneumonia on chest x-ray and started on Levaquin but it has not had a dose at today. He does feel short of breath at rest and worse with lying down, he has had fever and chills, denies other complaints at this time.   Past Medical History  Diagnosis Date  . Diabetes ([place])   . Hypertension   . COPD (chronic obstructive pulmonary disease) ([place])   . Congestive heart failure ([place])   . OSA on CPAP   . Atrial fibrillation ([place])   . CKD (chronic kidney disease)   . CAD (coronary artery disease)     Patient Active Problem List   Diagnosis Date Noted  . Hypoglycemia 05/12/2015  . Type 2 diabetes mellitus ([place]) 05/12/2015  . HTN (hypertension) 05/12/2015  . CAD (coronary artery disease) 05/12/2015  . Atrial flutter ([place]) 05/12/2015  . GERD (gastroesophageal reflux disease) 05/12/2015  . Solitary pulmonary nodule 02/10/2015  . OSA on CPAP 08/17/2014  . COPD (chronic obstructive pulmonary disease) ([place]) 08/17/2014  . Multifocal Pneumonia 08/17/2014    Past Surgical History  Procedure Laterality Date  . Heart bypass    . Hernia repair    . Clavicle surgery      Allergies Hydralazine; Penicillins; and Codeine  Social History Social History  Substance Use Topics  . Smoking status: Former Smoker -- 1.00 packs/day for 30 years    Types: Cigarettes  . Smokeless tobacco: Never Used     Comment: quit 12/14/2001  . Alcohol Use: No     Review of Systems Constitutional: Positive for fever and chills Eyes: Negative for visual changes. ENT: Negative for sore throat. Cardiovascular: Negative for chest pain. Respiratory: Positive shortness of breath and cough, dyspnea on exertion Gastrointestinal: Negative for abdominal pain, vomiting and diarrhea. Genitourinary: Negative for dysuria. Musculoskeletal: Negative for back pain. Skin: Negative for rash. Neurological: Negative for headaches, positive for weakness  10-point ROS otherwise negative.  ____________________________________________   PHYSICAL EXAM:  VITAL SIGNS: ED Triage Vitals  Enc Vitals Group     BP 07/09/15 1052 115/100 mmHg     Pulse Rate 07/09/15 1052 126     Resp 07/09/15 1052 22     Temp 07/09/15 1052 99.1 F (37.3 C)     Temp Source 07/09/15 1052 Oral     SpO2 07/09/15 1052 93 %     Weight 07/09/15 1052 218 lb (98.884 kg)     Height 07/09/15 1052 6' (1.829 m)     Head Cir --      Peak Flow --      Pain Score 07/09/15 1053 0     Pain Loc --      Pain Edu? --      Excl. in [place]? --     Constitutional: Alert and oriented. Mild distress Eyes: Conjunctivae are normal. PERRL. Normal extraocular movements. ENT   Head: Normocephalic and atraumatic.   Nose: No congestion/rhinnorhea.   Mouth/Throat: Mucous membranes are moist.   Neck: No  stridor. Cardiovascular: Rapid rate, irregular rhythm. Normal and symmetric distal pulses are present in all extremities. No murmurs, rubs, or gallops. Respiratory: Prolonged expiration with wheezing noted on the right. Gastrointestinal: Soft and nontender. Some distention Musculoskeletal: Nontender with normal range of motion in all extremities. No joint effusions.  No lower extremity tenderness nor edema. Neurologic:  Normal speech and language. No gross focal neurologic deficits are appreciated.  Skin:  Skin is warm, dry and intact. Ecchymosis is noted to the left side of the abdominal  wall Psychiatric: Mood and affect are normal. ____________________________________________  EKG: Interpreted by me. Atrial flutter with a variable AV block. Left axis deviation, wide QRS, normal QT interval.  ____________________________________________  ED COURSE:  Pertinent labs & imaging results that were available during my care of the patient were reviewed by me and considered in my medical decision making (see chart for details). Patient likely with pneumonia, we'll recheck his x-ray basic labs. ____________________________________________    LABS (pertinent positives/negatives)  Labs Reviewed  BASIC METABOLIC PANEL - Abnormal; Notable for the following:    Sodium 128 (*)    Chloride 93 (*)    Glucose, Bld 310 (*)    BUN 34 (*)    Creatinine, Ser 1.78 (*)    Calcium 8.3 (*)    GFR calc non Af Amer 36 (*)    GFR calc Af Amer 42 (*)    All other components within normal limits  CBC - Abnormal; Notable for the following:    RBC 4.37 (*)    Hemoglobin 12.0 (*)    HCT 36.5 (*)    RDW 15.5 (*)    Platelets 140 (*)    All other components within normal limits  TROPONIN I - Abnormal; Notable for the following:    Troponin I 0.06 (*)    All other components within normal limits  CULTURE, BLOOD (ROUTINE X 2)  CULTURE, BLOOD (ROUTINE X 2)  BRAIN NATRIURETIC PEPTIDE  URINALYSIS COMPLETEWITH MICROSCOPIC ([facility] ONLY)    RADIOLOGY Images were viewed by me  Chest x-ray IMPRESSION: Prominent interstitial lung densities bilaterally, right side greater than left. Differential diagnosis includes asymmetric pulmonary edema versus atypical pneumonia. ____________________________________________  FINAL ASSESSMENT AND PLAN  Dyspnea, hypoxia, elevated troponin level, mild hyponatremia  Plan: Patient with labs and imaging as dictated above. Patient had been treated for what was thought to be pneumonia. His white count is unremarkable, findings more consistent with heart failure  than pneumonia. He'll be given extra Lasix and will need serial troponins. He is currently stable this time on extra oxygen.   Earleen Newport, MD   Earleen Newport, MD 07/09/15 1257

## 2015-07-09 NOTE — Progress Notes (Signed)
*  PRELIMINARY RESULTS* Echocardiogram 2D Echocardiogram has been performed.  Ernest Mclean 07/09/2015, 6:43 PM

## 2015-07-10 LAB — GLUCOSE, CAPILLARY
GLUCOSE-CAPILLARY: 153 mg/dL — AB (ref 65–99)
GLUCOSE-CAPILLARY: 158 mg/dL — AB (ref 65–99)
Glucose-Capillary: 243 mg/dL — ABNORMAL HIGH (ref 65–99)
Glucose-Capillary: 284 mg/dL — ABNORMAL HIGH (ref 65–99)

## 2015-07-10 LAB — BASIC METABOLIC PANEL
ANION GAP: 6 (ref 5–15)
BUN: 35 mg/dL — AB (ref 6–20)
CALCIUM: 8.3 mg/dL — AB (ref 8.9–10.3)
CO2: 31 mmol/L (ref 22–32)
Chloride: 96 mmol/L — ABNORMAL LOW (ref 101–111)
Creatinine, Ser: 1.91 mg/dL — ABNORMAL HIGH (ref 0.61–1.24)
GFR calc Af Amer: 38 mL/min — ABNORMAL LOW (ref >60)
GFR, EST NON AFRICAN AMERICAN: 33 mL/min — AB (ref >60)
GLUCOSE: 213 mg/dL — AB (ref 65–99)
POTASSIUM: 4.1 mmol/L (ref 3.5–5.1)
Sodium: 133 mmol/L — ABNORMAL LOW (ref 135–145)

## 2015-07-10 LAB — TROPONIN I: TROPONIN I: 0.05 ng/mL — AB (ref <0.031)

## 2015-07-10 LAB — CBC
HEMATOCRIT: 34 % — AB (ref 40.0–52.0)
HEMOGLOBIN: 11.3 g/dL — AB (ref 13.0–18.0)
MCH: 27.4 pg (ref 26.0–34.0)
MCHC: 33.3 g/dL (ref 32.0–36.0)
MCV: 82.5 fL (ref 80.0–100.0)
Platelets: 141 10*3/uL — ABNORMAL LOW (ref 150–440)
RBC: 4.12 MIL/uL — ABNORMAL LOW (ref 4.40–5.90)
RDW: 15.7 % — AB (ref 11.5–14.5)
WBC: 8.9 10*3/uL (ref 3.8–10.6)

## 2015-07-10 MED ORDER — IPRATROPIUM-ALBUTEROL 0.5-2.5 (3) MG/3ML IN SOLN: 3.0000 mL | RESPIRATORY_TRACT | Status: DC | PRN

## 2015-07-10 NOTE — Progress Notes (Signed)
Pt request to have bed alarm off.

## 2015-07-10 NOTE — Progress Notes (Signed)
Pt took CPAP off and didn't have oxygen on. Unsure how long pt was without oxygen. O2 sat is 89%. Place pt on 4L [place] after breathing treatment

## 2015-07-10 NOTE — Progress Notes (Signed)
Subjective:  Patient states to feel reasonably today still has some mild shortness of breath some congestion treating with antibiotics for pneumonia. History of cardiomyopathy denies any chest pain  Objective:  Vital Signs in the last 24 hours: Temp:  [97.7 F (36.5 C)-100.9 F (38.3 C)] 99.7 F (37.6 C) (02/18 1114) Pulse Rate:  [44-128] 68 (02/18 1114) Resp:  [15-30] 24 (02/18 1114) BP: (133-162)/(47-79) 144/47 mmHg (02/18 1114) SpO2:  [88 %-100 %] 96 % (02/18 1114) Weight:  [97.433 kg (214 lb 12.8 oz)] 97.433 kg (214 lb 12.8 oz) (02/17 1605)  Intake/Output from previous day: 02/17 0701 - 02/18 0700 In: -  Out: 950 [Urine:950] Intake/Output from this shift: Total I/O In: 240 [P.O.:240] Out: 300 [Urine:300]  Physical Exam: General appearance: alert, cooperative and appears stated age Neck: no adenopathy, no carotid bruit, no JVD, supple, symmetrical, trachea midline and thyroid not enlarged, symmetric, no tenderness/mass/nodules Lungs: diminished throughoutsignificant wheezes diffuse rhonchi reduced air movement Heart: irregular rate and rhythm systolic ejection murmur mild displaced PMI soft S3 Abdomen: soft, non-tender; bowel sounds normal; no masses,  no organomegaly Extremities: extremities normal, atraumatic, no cyanosis or edema Pulses: 2+ and symmetric Skin: Skin color, texture, turgor normal. No rashes or lesions Neurologic: Alert and oriented X 3, normal strength and tone. Normal symmetric reflexes. Normal coordination and gait  Lab Results:  Recent Labs  07/09/15 1105 07/10/15 0429  WBC 9.9 8.9  HGB 12.0* 11.3*  PLT 140* 141*    Recent Labs  07/09/15 1105 07/10/15 0429  NA 128* 133*  K 4.9 4.1  CL 93* 96*  CO2 26 31  GLUCOSE 310* 213*  BUN 34* 35*  CREATININE 1.78* 1.91*    Recent Labs  07/09/15 2233 07/10/15 0424  TROPONINI 0.09* 0.05*   Hepatic Function Panel No results for input(s): PROT, ALBUMIN, AST, ALT, ALKPHOS, BILITOT, BILIDIR,  IBILI in the last 72 hours. No results for input(s): CHOL in the last 72 hours. No results for input(s): PROTIME in the last 72 hours.  Imaging: Imaging results have been reviewed  Cardiac Studies:  Assessment/Plan:  Angina Atrial Fibrillation Cardiomyopathy CHF Coronary Artery Disease Edema Ischemic Heart Disease Palpitations Shortness of Breath  Chronic renal insufficiency Elevated troponin Hyperlipidemia Obesity COPD Community-acquired pneumonia Hypoxemia . PLAN Agree with telemetry Continue anticoagulation with Eliquis for atrial fibrillation Supplemental oxygen for hypoxemia Continue inhalers for congestion and bronchitis COPD Agree with antibiotic therapy for bronchitis and pneumonia Continue diabetes management currently on metformin and Lantus NovoLog Chronic renal insufficiency appears to be stable follow-up with nephrology Continue Pravachol for hyperlipidemia Agree with metoprolol and amlodipine for hypertension control Do not recommend cardiac catheter at this point Borderline troponins probably represent demand ischemia Recommend conservative cardiac involvement at this point Doubt significant component of heart failure  LOS: 1 day    Luca Burston D. 07/10/2015, 12:09 PM

## 2015-07-10 NOTE — Progress Notes (Signed)
Patient ID: Ernest Mclean, male   DOB: 29-Jul-1940, 75 y.o.   MRN: [id number] [facility] PROGRESS NOTE  JAVAE HATHCOAT [id number] DOB: 1941-05-20 DOA: 07/09/2015 PCP: Juluis Pitch, MD  HPI/Subjective: Patient feeling better than when he came in. Wife states that his mental status over the last couple weeks has been off but much better now since he is on oxygen.  Objective: Filed Vitals:   07/10/15 1114 07/10/15 1234  BP: 144/47   Pulse: 68 69  Temp: 99.7 F (37.6 C)   Resp: 24     Filed Weights   07/09/15 1052 07/09/15 1605  Weight: 98.884 kg (218 lb) 97.433 kg (214 lb 12.8 oz)    ROS: Review of Systems  Constitutional: Negative for fever and chills.  Eyes: Negative for blurred vision.  Respiratory: Positive for cough and shortness of breath.   Cardiovascular: Negative for chest pain.  Gastrointestinal: Negative for nausea, vomiting, abdominal pain, diarrhea and constipation.  Genitourinary: Negative for dysuria.  Musculoskeletal: Negative for joint pain.  Neurological: Negative for dizziness and headaches.   Exam: Physical Exam  Constitutional: He is oriented to person, place, and time.  HENT:  Nose: No mucosal edema.  Mouth/Throat: No oropharyngeal exudate or posterior oropharyngeal edema.  Eyes: Conjunctivae, EOM and lids are normal. Pupils are equal, round, and reactive to light.  Neck: No JVD present. Carotid bruit is not present. No edema present. No thyroid mass and no thyromegaly present.  Cardiovascular: S1 normal and S2 normal.  Exam reveals no gallop.   No murmur heard. Pulses:      Dorsalis pedis pulses are 0 on the right side, and 0 on the left side.  Respiratory: No respiratory distress. He has no wheezes. He has no rhonchi. He has rales in the right lower field and the left lower field.  GI: Soft. Bowel sounds are normal. There is no tenderness.  Musculoskeletal:       Right ankle: He exhibits swelling.       Left ankle: He exhibits  swelling.  Lymphadenopathy:    He has no cervical adenopathy.  Neurological: He is alert and oriented to person, place, and time. No cranial nerve deficit.  Skin: Skin is warm. No rash noted. Nails show no clubbing.  Psychiatric: He has a normal mood and affect.      Data Reviewed: Basic Metabolic Panel:  Recent Labs Lab 07/09/15 1105 07/10/15 0429  NA 128* 133*  K 4.9 4.1  CL 93* 96*  CO2 26 31  GLUCOSE 310* 213*  BUN 34* 35*  CREATININE 1.78* 1.91*  CALCIUM 8.3* 8.3*   CBC:  Recent Labs Lab 07/09/15 1105 07/10/15 0429  WBC 9.9 8.9  HGB 12.0* 11.3*  HCT 36.5* 34.0*  MCV 83.3 82.5  PLT 140* 141*   Cardiac Enzymes:  Recent Labs Lab 07/09/15 1105 07/09/15 1650 07/09/15 2233 07/10/15 0424  TROPONINI 0.06* 0.08* 0.09* 0.05*   BNP (last 3 results)  Recent Labs  07/09/15 1105  BNP 456.0*    CBG:  Recent Labs Lab 07/09/15 1243 07/09/15 1703 07/09/15 2206 07/10/15 0829 07/10/15 1115  GLUCAP 278* 244* 166* 243* 284*    Recent Results (from the past 240 hour(s))  Blood culture (routine x 2)     Status: None (Preliminary result)   Collection Time: 07/09/15 12:58 PM  Result Value Ref Range Status   Specimen Description BLOOD LEFT HAND  Final   Special Requests BOTTLES DRAWN AEROBIC AND ANAEROBIC 2CCAERO,2CCANA  Final  Culture NO GROWTH < 24 HOURS  Final   Report Status PENDING  Incomplete  Blood culture (routine x 2)     Status: None (Preliminary result)   Collection Time: 07/09/15 12:59 PM  Result Value Ref Range Status   Specimen Description BLOOD RIGHT FATTY CASTS  Final   Special Requests   Final    BOTTLES DRAWN AEROBIC AND ANAEROBIC 2CC AERO,2CCANA   Culture NO GROWTH < 24 HOURS  Final   Report Status PENDING  Incomplete     Studies: Dg Chest 2 View  07/09/2015  CLINICAL DATA:  Previous pneumonia. Fever with weakness and shortness of breath. EXAM: CHEST  2 VIEW COMPARISON:  07/12/2014 FINDINGS: Post median sternotomy. Enlarged  interstitial lung markings bilaterally, particularly on the right side. Heart size is within normal limits. No large pleural effusions. The trachea is midline. Old right rib fractures. IMPRESSION: Prominent interstitial lung densities bilaterally, right side greater than left. Differential diagnosis includes asymmetric pulmonary edema versus atypical pneumonia. Electronically Signed   By: Markus Daft M.D.   On: 07/09/2015 11:45    Scheduled Meds: . amLODipine  5 mg Oral Daily  . apixaban  5 mg Oral BID  . aspirin EC  81 mg Oral Daily  . citalopram  20 mg Oral QHS  . cyanocobalamin  500 mcg Oral Daily  . furosemide  40 mg Intravenous Q12H  . insulin aspart  12 Units Subcutaneous TID WC  . insulin glargine  75 Units Subcutaneous QHS  . levofloxacin (LEVAQUIN) IV  250 mg Intravenous Q24H  . metFORMIN  1,000 mg Oral Q supper  . metoprolol  100 mg Oral BID  . mometasone-formoterol  2 puff Inhalation BID  . pantoprazole  40 mg Oral QAC breakfast  . pravastatin  20 mg Oral QHS  . sodium chloride flush  3 mL Intravenous Q12H  . tiotropium  18 mcg Inhalation Daily      Assessment/Plan:  1. Acute respiratory failure with hypoxia. When I walked in the room the patient was on 4 L nasal cannula. Try to taper oxygen.Check pulse ox tomorrow morning to see if he qualifies for home oxygen. 2. Pneumonia bilateral lower lobes. On IV Levaquin 3. Acute on chronic diastolic chf- continue iv lasix, metoprolol 4. CKD stage 3 watch with duiresis 5. Sleep apnea on cpap at home 6. Atrial fibrillation- anticoagulation with eliquis 7. DM type 2- continue lantus and sliding scale  Code Status:     Code Status Orders        Start     Ordered   07/09/15 1611  Full code   Continuous     07/09/15 1610    Code Status History    Date Active Date Inactive Code Status Order ID Comments User Context   05/13/2015 12:50 AM 05/14/2015  5:20 PM Full Code [id number]  Lance Coon, MD ED    Advance Directive  Documentation        Most Recent Value   Type of Advance Directive  Healthcare Power of Attorney   Pre-existing out of facility DNR order (yellow form or pink MOST form)     "MOST" Form in Place?       Family Communication:wife at the bedside Disposition Plan: home soon  Consultants:  cardiology  Antibiotics:  levaquin  Time spent: 22minutes  Bryn Saline, [facility]

## 2015-07-10 NOTE — Plan of Care (Signed)
Problem: Activity: Goal: Capacity to carry out activities will improve Outcome: Progressing Pt is ambulating with minimal assist. Pt requires 4liters of oxygen but no dyspnea at rest.

## 2015-07-10 NOTE — Evaluation (Signed)
Physical Therapy Evaluation Patient Details Name: Ernest Mclean MRN: [id number] DOB: 09-Jan-1941 Today's Date: 07/10/2015   History of Present Illness  75yo white male comes to [facility] on 2/17 p 3d worsening SOB, cough, and weight gain, found to be febrile in ED. Pt admitted for CHF exacerabtion and PNA, both revealed on CXR in ED. PMH: CAD s/p CABG, COPD (2L O2 sat home at over-night only), CHF, CKD, and IDDM. Labs showing elevated but down-trending Tr-I, last value at .05.     Clinical Impression  At evaluation, pt is received semirecumbent in bed upon entry with wife present. Pt is familiar to this PT from prior admissions. The pt is awake, alert, and willing to participate. No acute distress noted at this time. The pt is oriented x3, pleasant, conversational, and following simple and multi-step commands consistently. Pt demonstrates all mobility during session without LOB. Pt grip strength is strong and symmetrical; global strength as screened during functional mobility assessment presents with mild to moderate impairment, however patient reports that he is only ~10% impaired compared to 1 week ago. Pt able to complete all functional mobility except for bed mobility (minA) without physical assistance. Pt received on 4L/min O2, but gradually moved down to RA, wherein pt maintains SaO2>89% throughout evaluation, which is slightly less than baseline 95% at home on RA, however he and wife attest that they do not check it with exertion, only at rest when checking BG. RN requests pt be returned to 2L at termination of session. Wife expresses frustration over chronic activity tolerance limitations experienced by patient with concern that he should be on supplemental O2 with all activity now. We discussed at length the multiple co-morbidities that likely contribute to the pt's limited activity tolerance including CHF, COPD, CKD (with suspected depression of RBC synthesis), and current PNA, and explained that in  light of current desaturation and stability, pt would likely not benefit from sup O2 during the day currently, however I encouraged them to monitor SaO2 during exertional activities. 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 Patient presenting with mild to moderate impairment of strength, oxygen perfusion, and activity tolerance, however very close to baseline functional level. No additional PT services needed at this time and pt/wife are agreeable. PT signing off.      Follow Up Recommendations No PT follow up    Equipment Recommendations  None recommended by PT    Recommendations for Other Services       Precautions / Restrictions Precautions Precautions: Fall Restrictions Weight Bearing Restrictions: No      Mobility  Bed Mobility Overal bed mobility: Needs Assistance Bed Mobility: Supine to Sit;Sit to Supine     Supine to sit: Min assist Sit to supine: Modified independent (Device/Increase time)   General bed mobility comments: Pulls self up with single HHA.   Transfers Overall transfer level: Independent Equipment used: None             General transfer comment: 5x STS in 16.09s, vitals stable  Ambulation/Gait Ambulation/Gait assistance: Supervision Ambulation Distance (Feet): 200 Feet Assistive device: None Gait Pattern/deviations: Decreased step length - left;Decreased step length - right;Wide base of support     General Gait Details: decreased weight shift, appears stable no LOB.   Stairs            Wheelchair Mobility    Modified Rankin (Stroke Patients Only)       Balance Overall  balance assessment: No apparent balance deficits (not formally assessed)                                           Pertinent Vitals/Pain Pain Assessment: No/denies pain    Home Living Family/patient expects to be discharged to:: Private residence Living  Arrangements: Spouse/significant other Available Help at Discharge: Family Type of Home: House Home Access: Stairs to enter Entrance Stairs-Rails: None Entrance Stairs-Number of Steps: 1 Home Layout: Two level;Able to live on main level with bedroom/bathroom Home Equipment: Kasandra Knudsen - single point;Other (comment) (RW availabel as needed from friend (as before) )      Prior Function Level of Independence: Independent with assistive device(s)   Gait / Transfers Assistance Needed: limited community distances, requires frequent breaks due to vascular claudication in R calf, chronic problem.             Hand Dominance        Extremity/Trunk Assessment   Upper Extremity Assessment: Overall WFL for tasks assessed;Generalized weakness           Lower Extremity Assessment: Generalized weakness;Overall WFL for tasks assessed         Communication   Communication: No difficulties  Cognition Arousal/Alertness: Awake/alert Behavior During Therapy: WFL for tasks assessed/performed Overall Cognitive Status: Within Functional Limits for tasks assessed       Memory: Decreased recall of precautions              General Comments      Exercises        Assessment/Plan    PT Assessment Patent does not need any further PT services  PT Diagnosis Generalized weakness;Abnormality of gait   PT Problem List    PT Treatment Interventions     PT Goals (Current goals can be found in the Care Plan section) Acute Rehab PT Goals Patient Stated Goal: Improve functional activity tolerance.   PT Goal Formulation: All assessment and education complete, DC therapy    Frequency     Barriers to discharge        Co-evaluation               End of Session Equipment Utilized During Treatment: Gait belt;Oxygen Activity Tolerance: Patient tolerated treatment well Patient left: in bed;with call bell/phone within reach;with family/visitor present;with bed alarm set Nurse  Communication: Mobility status;Other (comment) (satting well on RA c activity, RN askes to return patient to 2L until can be assessed again. )         Time: [id number] PT Time Calculation (min) (ACUTE ONLY): 29 min   Charges:   PT Evaluation $PT Eval Low Complexity: 1 Procedure PT Treatments $Therapeutic Activity: 8-22 mins   PT G Codes:        1:11 PM, 2015-07-17 Etta Grandchild, PT, DPT PRN [facility] Therapist at [place] License # [id number] [phone] (mobile)

## 2015-07-11 LAB — GLUCOSE, CAPILLARY
Glucose-Capillary: 122 mg/dL — ABNORMAL HIGH (ref 65–99)
Glucose-Capillary: 184 mg/dL — ABNORMAL HIGH (ref 65–99)

## 2015-07-11 LAB — BASIC METABOLIC PANEL
Anion gap: 5 (ref 5–15)
BUN: 40 mg/dL — AB (ref 6–20)
CALCIUM: 8.5 mg/dL — AB (ref 8.9–10.3)
CHLORIDE: 98 mmol/L — AB (ref 101–111)
CO2: 33 mmol/L — AB (ref 22–32)
CREATININE: 1.88 mg/dL — AB (ref 0.61–1.24)
GFR calc Af Amer: 39 mL/min — ABNORMAL LOW (ref >60)
GFR calc non Af Amer: 34 mL/min — ABNORMAL LOW (ref >60)
GLUCOSE: 136 mg/dL — AB (ref 65–99)
Potassium: 3.7 mmol/L (ref 3.5–5.1)
Sodium: 136 mmol/L (ref 135–145)

## 2015-07-11 MED ORDER — LEVOFLOXACIN 500 MG PO TABS
250.0000 mg | ORAL_TABLET | Freq: Every day | ORAL | Status: DC
  Administered 2015-07-11: 250 mg via ORAL
  Filled 2015-07-11: qty 1

## 2015-07-11 MED ORDER — FUROSEMIDE 40 MG PO TABS: ORAL_TABLET | ORAL | Status: DC

## 2015-07-11 MED ORDER — LEVOFLOXACIN 250 MG PO TABS: 250.0000 mg | ORAL_TABLET | Freq: Every day | ORAL | Status: DC

## 2015-07-11 NOTE — Progress Notes (Signed)
Discharge instructions along with home medication list gone over in great detail with patient and wife. Both verbalized that they understood instructions. Two printed rx given to patient. Heart failure card given to patient with contact information. Personal 02 tank delivered and at bedside, patient to be discharged on 02 at 2L. No distress noted no c/o pain. Iv and telemetry removed. Patient being discharged home, wife at bedside to transport home.

## 2015-07-11 NOTE — Care Management Note (Signed)
Case Management Note  Patient Details  Name: Ernest Mclean MRN: [id number] Date of Birth: Jul 26, 1940  Subjective/Objective:    Order for continuous home oxygen faxed and called to [facility]. Request for portable oxygen tank to be delivered to [name] in room 251 today as soon as possible.                 Action/Plan:   Expected Discharge Date:                  Expected Discharge Plan:     In-House Referral:     Discharge planning Services     Post Acute Care Choice:    Choice offered to:     DME Arranged:    DME Agency:     HH Arranged:    [place] Agency:     Status of Service:     Medicare Important Message Given:    Date Medicare IM Given:    Medicare IM give by:    Date Additional Medicare IM Given:    Additional Medicare Important Message give by:     If discussed at [place] of Stay Meetings, dates discussed:    Additional Comments:  Adalynne Steffensmeier A, RN 07/11/2015, 9:29 AM

## 2015-07-11 NOTE — Discharge Instructions (Signed)
Respiratory failure is when your lungs are not working well and your breathing (respiratory) system fails. When respiratory failure occurs, it is difficult for your lungs to get enough oxygen, get rid of carbon dioxide, or both. Respiratory failure can be life threatening.  °Respiratory failure can be acute or chronic. Acute respiratory failure is sudden, severe, and requires emergency medical treatment. Chronic respiratory failure is less severe, happens over time, and requires ongoing treatment.  °WHAT ARE THE CAUSES OF ACUTE RESPIRATORY FAILURE?  °Any problem affecting the heart or lungs can cause acute respiratory failure. Some of these causes include the following: °· Chronic bronchitis and emphysema (COPD).   °· Blood clot going to a lung (pulmonary embolism).   °· Having water in the lungs caused by heart failure, lung injury, or infection (pulmonary edema).   °· Collapsed lung (pneumothorax).   °· Pneumonia.   °· Pulmonary fibrosis.   °· Obesity.   °· Asthma.   °· Heart failure.   °· Any type of trauma to the chest that can make breathing difficult.   °· Nerve or muscle diseases making chest movements difficult. °HOW WILL MY ACUTE RESPIRATORY FAILURE BE TREATED?  °Treatment of acute respiratory failure depends on the cause of the respiratory failure. Usually, you will stay in the intensive care unit so your breathing can be watched closely. Treatment can include the following: °· Oxygen. Oxygen can be delivered through the following: °¨ Nasal cannula. This is small tubing that goes in your nose to give you oxygen. °¨ Face mask. A face mask covers your nose and mouth to give you oxygen. °· Medicine. Different medicines can be given to help with breathing. These can include: °¨ Nebulizers. Nebulizers deliver medicines to open the air passages (bronchodilators). These medicines help to open or relax the airways in the lungs so you can breathe better. They can also help loosen mucus from your  lungs. °¨ Diuretics. Diuretic medicines can help you breathe better by getting rid of extra water in your body. °¨ Steroids. Steroid medicines can help decrease swelling (inflammation) in your lungs. °¨ Antibiotics. °· Chest tube. If you have a collapsed lung (pneumothorax), a chest tube is placed to help reinflate the lung. °· Noninvasive positive pressure ventilation (NPPV). This is a tight-fitting mask that goes over your nose and mouth. The mask has tubing that is attached to a machine. The machine blows air into the tubing, which helps to keep the tiny air sacs (alveoli) in your lungs open. This machine allows you to breathe on your own. °· Ventilator. A ventilator is a breathing machine. When on a ventilator, a breathing tube is put into the lungs. A ventilator is used when you can no longer breathe well enough on your own. You may have low oxygen levels or high carbon dioxide (CO2) levels in your blood. When you are on a ventilator, sedation and pain medicines are given to make you sleep so your lungs can heal. °SEEK IMMEDIATE MEDICAL CARE IF: °· You have shortness of breath (dyspnea) with or without activity. °· You have rapid breathing (tachypnea). °· You are wheezing. °· You are unable to say more than a few words without having to catch your breath. °· You find it very difficult to function normally. °· You have a fast heart rate. °· You have a bluish color to your finger or toe nail beds. °· You have confusion or drowsiness or both. °  °This information is not intended to replace advice given to you by your health care provider. Make sure you discuss   any questions you have with your health care provider. °  °Document Released: 05/13/2013 Document Revised: 01/27/2015 Document Reviewed: 05/13/2013 °Elsevier Interactive Patient Education ©2016 [employer] ° °

## 2015-07-11 NOTE — Discharge Summary (Signed)
[facility] [place] NAME: Ernest Mclean    MR#:  [id number]  DATE OF BIRTH:  August 07, 1940  DATE OF ADMISSION:  07/09/2015 ADMITTING PHYSICIAN: Gladstone Lighter, MD  DATE OF DISCHARGE: 07/11/2015  PRIMARY CARE PHYSICIAN: Juluis Pitch, MD    ADMISSION DIAGNOSIS:  Acute pulmonary edema ([place]) [J81.0] Dyspnea [R06.00] Hypoxia [R09.02] Elevated troponin I level [R79.89]  DISCHARGE DIAGNOSIS:  Active Problems:   CHF exacerbation ([place])   SECONDARY DIAGNOSIS:   Past Medical History  Diagnosis Date  . Diabetes ([place])   . Hypertension   . COPD (chronic obstructive pulmonary disease) ([place])   . Congestive heart failure ([place])   . OSA on CPAP   . Atrial fibrillation ([place])   . CKD (chronic kidney disease)   . CAD (coronary artery disease)     HOSPITAL COURSE:   1. Acute respiratory failure now chronic respiratory failure. Pulse ox 84% on room air after ambulation. This qualifies the patient for 24/7 oxygen at home. The patient was advised to wear his oxygen at all times. Previously he just had it at night. With the oxygen on, his mental status has been better. 2. Acute on chronic diastolic heart failure. Patient was diuresed with IV Lasix. His oral Lasix upon going home will be increased to 40 mg daily. He can take an extra Lasix in the afternoon if weight gain of 3 pounds or increased swelling. The patient will continue his metoprolol. 3. Possible pneumonia. Finish up course of Levaquin orally 4. Atrial fibrillation anticoagulated with eliquis 5. Chronic kidney disease stage III 6. Hyperlipidemia unspecified- continue statin 7. Type 2 diabetes- continue insulin regimen and DC Glucophage   DISCHARGE CONDITIONS:   Satisfactory  CONSULTS OBTAINED:  Treatment Team:  Yolonda Kida, MD  DRUG ALLERGIES:   Allergies  Allergen Reactions  . Hydralazine Other (See Comments)    tongue swollen and couldn't wake up ---- not  positive it was this or a mix of this with something else or high sugar  . Penicillins Other (See Comments)    Passed out (at 75 yrs old) Has patient had a PCN reaction causing immediate rash, facial/tongue/throat swelling, SOB or lightheadedness with hypotension: No Has patient had a PCN reaction causing severe rash involving mucus membranes or skin necrosis: No Has patient had a PCN reaction that required hospitalization No Has patient had a PCN reaction occurring within the last 10 years: No If all of the above answers are "NO", then may proceed with Cephalosporin use.   . Codeine Hives, Rash and Swelling    DISCHARGE MEDICATIONS:   Current Discharge Medication List    CONTINUE these medications which have CHANGED   Details  furosemide (LASIX) 40 MG tablet Take one tablet daily.  Can take extra tablet in afternoon if increase weight gain of 3 pounds or increased swelling. Qty: 30 tablet, Refills: 60    levofloxacin (LEVAQUIN) 250 MG tablet Take 1 tablet (250 mg total) by mouth daily. Qty: 8 tablet, Refills: 0      CONTINUE these medications which have NOT CHANGED   Details  albuterol (PROVENTIL HFA;VENTOLIN HFA) 108 (90 BASE) MCG/ACT inhaler Inhale 2 puffs into the lungs every 4 (four) hours as needed for wheezing or shortness of breath. Qty: 1 Inhaler, Refills: 2    amLODipine (NORVASC) 5 MG tablet Take 5 mg by mouth daily.     apixaban (ELIQUIS) 5 MG TABS tablet Take 5 mg by mouth 2 (two) times  daily.    aspirin EC 81 MG tablet Take 81 mg by mouth daily.    citalopram (CELEXA) 20 MG tablet Take 20 mg by mouth at bedtime.  Refills: 5    CRANBERRY PO Take 820 mg by mouth daily.    cyanocobalamin 500 MCG tablet Take 500 mcg by mouth daily.    diazepam (VALIUM) 5 MG tablet Take 5 mg by mouth every 12 (twelve) hours as needed for anxiety.     Fluticasone-Salmeterol (ADVAIR DISKUS) 500-50 MCG/DOSE AEPB Inhale 1 puff into the lungs 2 (two) times daily. Qty: 60 each,  Refills: 5    insulin lispro (HUMALOG) 100 UNIT/ML injection Inject 22-35 Units into the skin 3 (three) times daily before meals. 22 units before breakfast, 25 units before lunch, and 35 units before supper.    LANTUS 100 UNIT/ML injection Inject 0.75 mLs (75 Units total) into the skin at bedtime. Qty: 10 mL, Refills: 5    metoprolol (LOPRESSOR) 100 MG tablet Take 100 mg by mouth 2 (two) times daily. Refills: 0    OMEGA-3 FATTY ACIDS-VITAMIN E PO Take 1 capsule by mouth daily.    omeprazole (PRILOSEC) 20 MG capsule Take 20 mg by mouth daily. Refills: 3    pravastatin (PRAVACHOL) 20 MG tablet Take 20 mg by mouth at bedtime. Refills: 3    tiotropium (SPIRIVA) 18 MCG inhalation capsule Place 18 mcg into inhaler and inhale daily.    Vitamin D, Ergocalciferol, (DRISDOL) 50000 UNITS CAPS capsule Take 1 capsule by mouth every 30 (thirty) days.  Refills: 0      STOP taking these medications     metFORMIN (GLUCOPHAGE-XR) 500 MG 24 hr tablet      OXYGEN      NOVOLOG 100 UNIT/ML injection          DISCHARGE INSTRUCTIONS:   Follow-up with CHF clinic as outpatient, follow-up with Dr. Clayborn Bigness one week, follow-up with PMD one week.  If you experience worsening of your admission symptoms, develop shortness of breath, life threatening emergency, suicidal or homicidal thoughts you must seek medical attention immediately by calling 911 or calling your MD immediately  if symptoms less severe.  You Must read complete instructions/literature along with all the possible adverse reactions/side effects for all the Medicines you take and that have been prescribed to you. Take any new Medicines after you have completely understood and accept all the possible adverse reactions/side effects.   Please note  You were cared for by a hospitalist during your hospital stay. If you have any questions about your discharge medications or the care you received while you were in the hospital after you are  discharged, you can call the unit and asked to speak with the hospitalist on call if the hospitalist that took care of you is not available. Once you are discharged, your primary care physician will handle any further medical issues. Please note that NO REFILLS for any discharge medications will be authorized once you are discharged, as it is imperative that you return to your primary care physician (or establish a relationship with a primary care physician if you do not have one) for your aftercare needs so that they can reassess your need for medications and monitor your lab values.    Today   CHIEF COMPLAINT:   Chief Complaint  Patient presents with  . Shortness of Breath    HISTORY OF PRESENT ILLNESS:  Ernest Mclean  is a 75 y.o. male with a known history of CHF presented  with shortness of breath   VITAL SIGNS:  Blood pressure 133/63, pulse 97, temperature 98.1 F (36.7 C), temperature source Oral, resp. rate 16, height 6' (1.829 m), weight 97.433 kg (214 lb 12.8 oz), SpO2 99 %.    PHYSICAL EXAMINATION:  GENERAL:  75 y.o.-year-old patient lying in the bed with no acute distress.  EYES: Pupils equal, round, reactive to light and accommodation. No scleral icterus. Extraocular muscles intact.  HEENT: Head atraumatic, normocephalic. Oropharynx and nasopharynx clear.  NECK:  Supple, no jugular venous distention. No thyroid enlargement, no tenderness.  LUNGS: Normal breath sounds bilaterally, no wheezing, rales,rhonchi or crepitation. No use of accessory muscles of respiration.  CARDIOVASCULAR: S1, S2 normal. 2/6 systolic  murmurs,no  rubs, or gallops.  ABDOMEN: Soft, non-tender, non-distended. Bowel sounds present. No organomegaly or mass.  EXTREMITIES: No pedal edema, cyanosis, or clubbing.  NEUROLOGIC: Cranial nerves II through XII are intact. Muscle strength 5/5 in all extremities. Sensation intact. Gait not checked.  PSYCHIATRIC: The patient is alert and oriented x 3.  SKIN: No  obvious rash, lesion, or ulcer.   DATA REVIEW:   CBC  Recent Labs Lab 07/10/15 0429  WBC 8.9  HGB 11.3*  HCT 34.0*  PLT 141*    Chemistries   Recent Labs Lab 07/11/15 0550  NA 136  K 3.7  CL 98*  CO2 33*  GLUCOSE 136*  BUN 40*  CREATININE 1.88*  CALCIUM 8.5*    Cardiac Enzymes  Recent Labs Lab 07/10/15 0424  TROPONINI 0.05*    Microbiology Results  Results for orders placed or performed during the hospital encounter of 07/09/15  Blood culture (routine x 2)     Status: None (Preliminary result)   Collection Time: 07/09/15 12:58 PM  Result Value Ref Range Status   Specimen Description BLOOD LEFT HAND  Final   Special Requests BOTTLES DRAWN AEROBIC AND ANAEROBIC 2CCAERO,2CCANA  Final   Culture NO GROWTH 2 DAYS  Final   Report Status PENDING  Incomplete  Blood culture (routine x 2)     Status: None (Preliminary result)   Collection Time: 07/09/15 12:59 PM  Result Value Ref Range Status   Specimen Description BLOOD RIGHT FATTY CASTS  Final   Special Requests   Final    BOTTLES DRAWN AEROBIC AND ANAEROBIC 2CC AERO,2CCANA   Culture NO GROWTH 2 DAYS  Final   Report Status PENDING  Incomplete   Management plans discussed with the patient, family and they are in agreement.  CODE STATUS:     Code Status Orders        Start     Ordered   07/09/15 1611  Full code   Continuous     07/09/15 1610    Code Status History    Date Active Date Inactive Code Status Order ID Comments User Context   05/13/2015 12:50 AM 05/14/2015  5:20 PM Full Code [id number]  Lance Coon, MD ED    Advance Directive Documentation        Most Recent Value   Type of Advance Directive  Healthcare Power of Attorney   Pre-existing out of facility DNR order (yellow form or pink MOST form)     "MOST" Form in Place?        TOTAL TIME TAKING CARE OF THIS PATIENT: 40  minutes.    Loletha Grayer M.D on 07/11/2015 at 1:47 PM  Between 7am to 6pm - Pager - [phone]  After 6pm go  to [email or web address] - [place]  [facility]  Office  [phone]  CC: Primary care physician; Juluis Pitch, MD

## 2015-07-11 NOTE — Progress Notes (Signed)
SATURATION QUALIFICATIONS: (This note is used to comply with regulatory documentation for home oxygen)  Patient Saturations on Room Air at Rest = 93%  Patient Saturations on Room Air while Ambulating = 84%  Patient Saturations on 2 Liters of oxygen while Ambulating = 95%  Please briefly explain why patient needs home oxygen: 

## 2015-07-14 LAB — CULTURE, BLOOD (ROUTINE X 2)
CULTURE: NO GROWTH
CULTURE: NO GROWTH

## 2015-07-18 IMAGING — CT CT CHEST W/O CM
2 of 3 series · 15 of 36 positions shown, 18 images · non-contrast
Comparison: Chest CT, 06/22/2014

CLINICAL DATA: Patient admitted for weakness, SOB, hypotension and
elevated glucose at home with PMH of CAD status post CABG. Hx
diabetes, COPD, CHF, AFIB, pneumonia + bronchitis. No hx CA.

EXAM:
CT CHEST WITHOUT CONTRAST
TECHNIQUE: Multidetector CT imaging of the chest was performed following the
standard protocol without IV contrast..

[Series 2: routine chest wo · axial · 0.74mm/px · z∈[-694,-404]mm · 12 of 70 slices shown, 15 images]
[im 6/70  mediastinal]
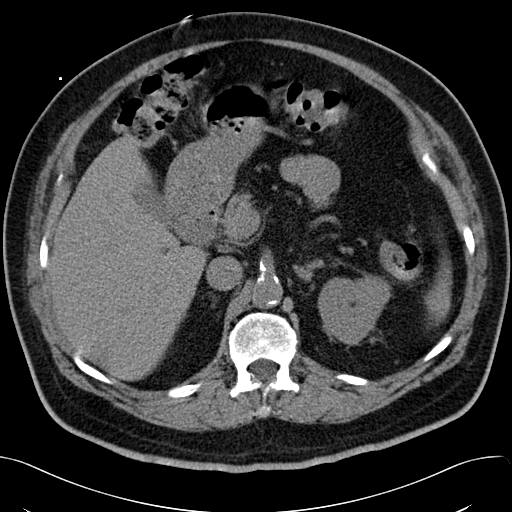
[im 6/70  lung]
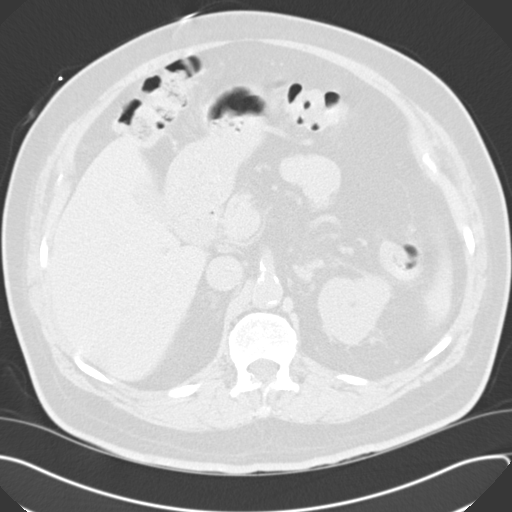
[im 11/70  lung]
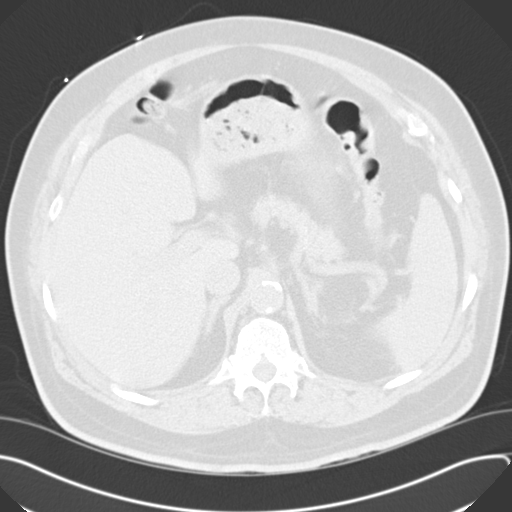
[im 16/70  lung]
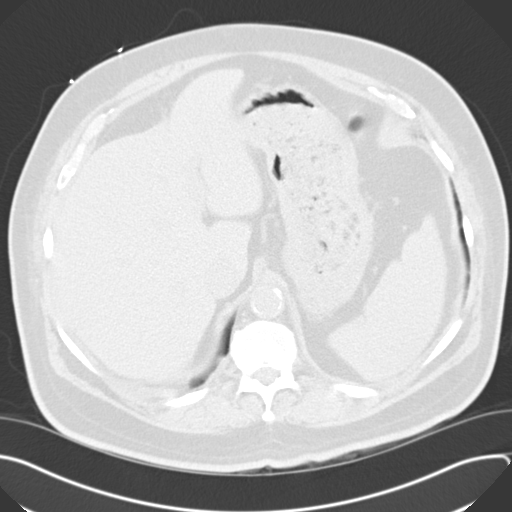
[im 21/70  lung]
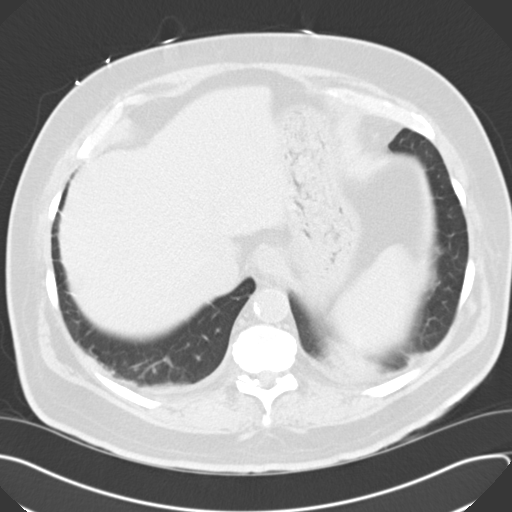
[im 26/70  mediastinal]
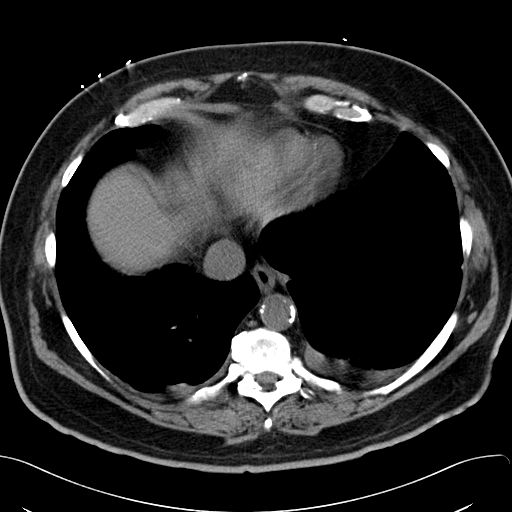
[im 26/70  lung]
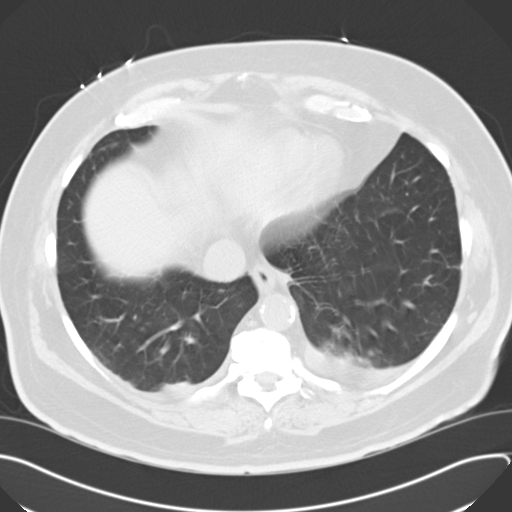
[im 31/70  lung]
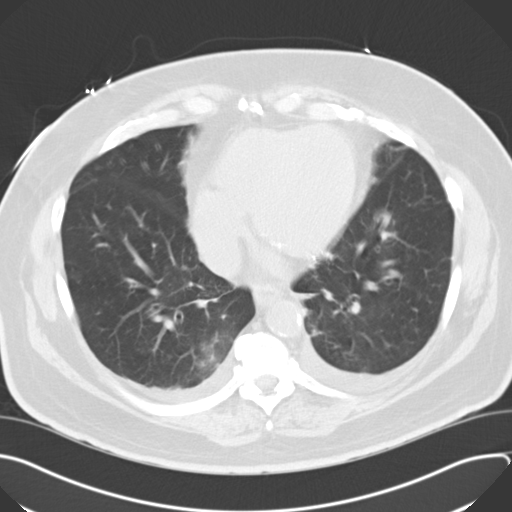
[im 39/70  lung]
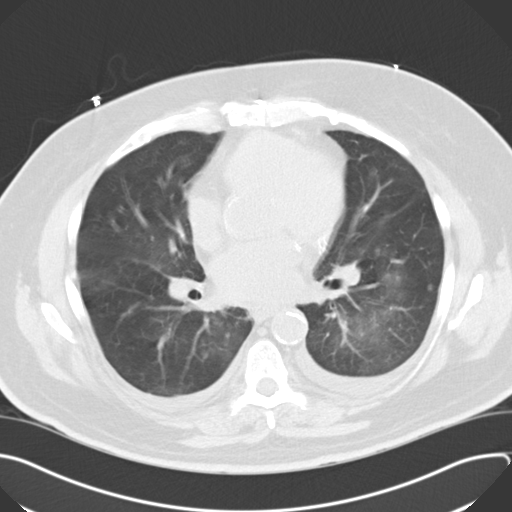
[im 44/70  lung]
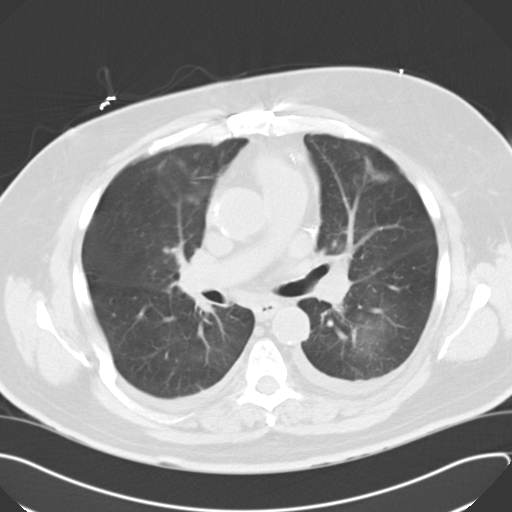
[im 49/70  mediastinal]
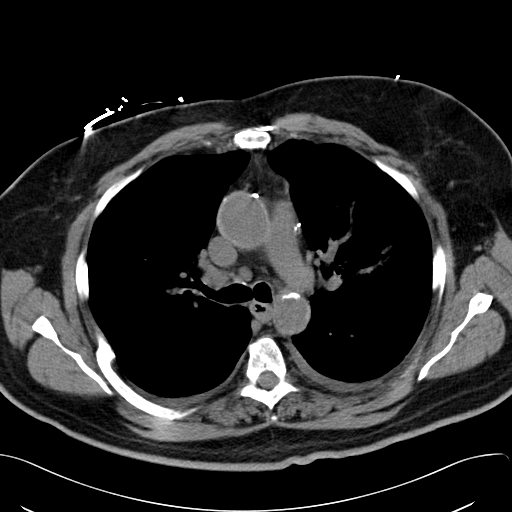
[im 49/70  lung]
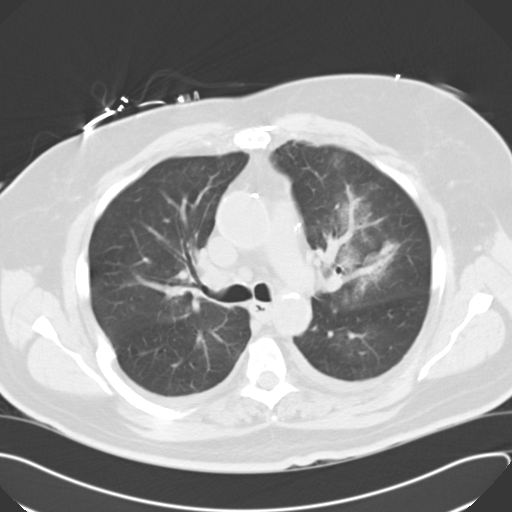
[im 54/70  lung]
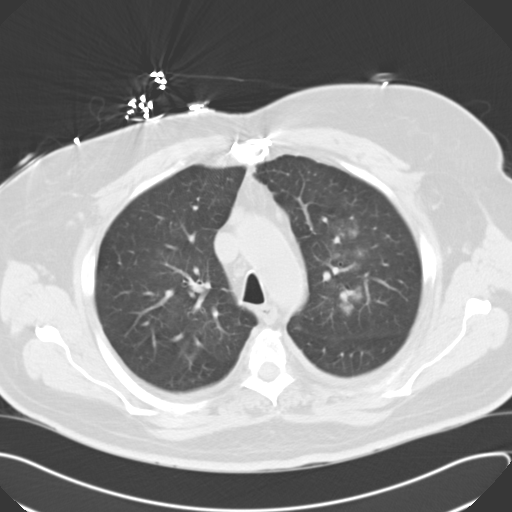
[im 59/70  lung]
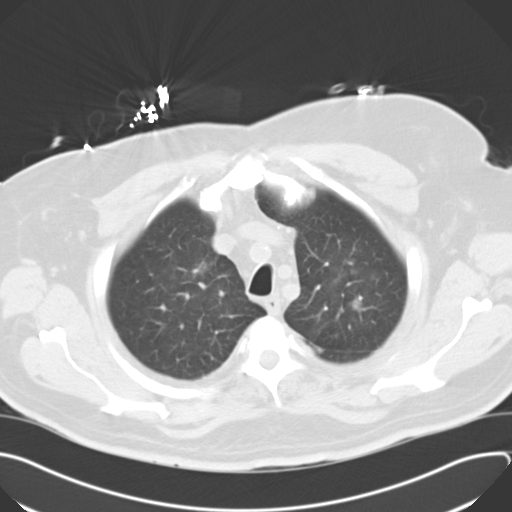
[im 64/70  lung]
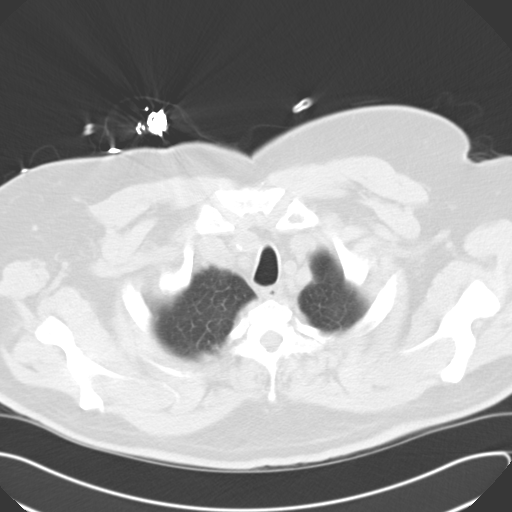

[Series 5: cor routine chest wo · coronal · 0.68mm/px · 3 of 171 slices shown]
[im 35/171  lung]
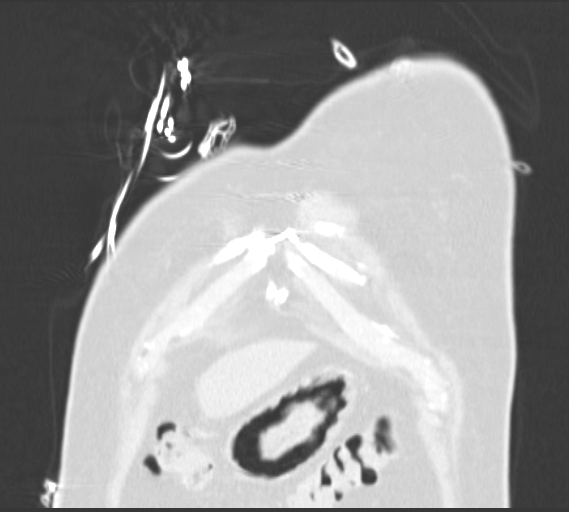
[im 69/171  lung]
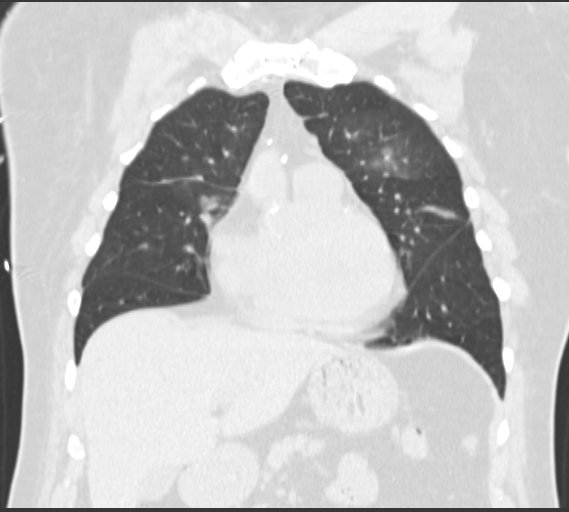
[im 103/171  lung]
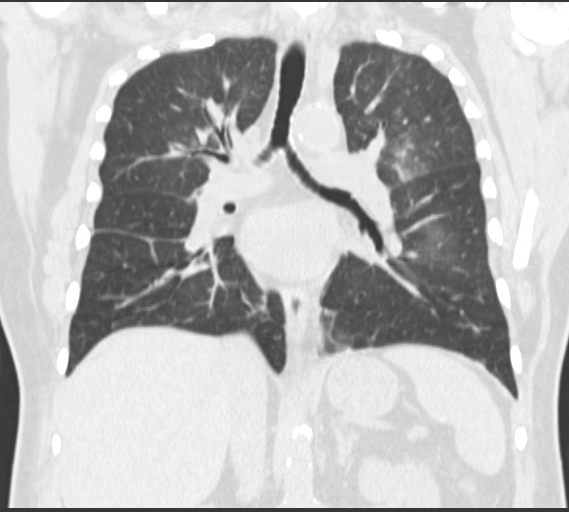

[15 of 36 positions shown; findings below may reference images not displayed]

FINDINGS: Thoracic inlet:  No masses.  No neck base or axillary adenopathy.

Mediastinum: Stable changes from CABG surgery. Moderate coronary
artery calcifications. Heart normal in size. No mediastinal or hilar
masses. Borderline enlarged precarinal lymph node is stable from the
recent prior exam.

Lungs and pleura: There are new areas of ground-glass opacity in a
patchy distribution in both lungs, most prominent in the left upper
lobe. There are new small effusions. Additional dependent lower lobe
opacity is noted consistent with atelectasis. No pneumothorax.

Limited upper abdomen:  Unremarkable.

Musculoskeletal:  No osteoblastic or osteolytic lesions.
IMPRESSION: 1. Pattern of lung opacities has changed from the prior study. There
is now patchy ground-glass opacity that predominates in the upper
lobes. The more confluent right lower lobe opacity seen on the prior
study has mostly resolved. There are new small effusions. Findings
may reflect pulmonary edema. However, there is no interstitial
thickening. Multifocal pneumonia is favored.

## 2015-07-19 ENCOUNTER — Encounter: Payer: Self-pay | Admitting: Family

## 2015-07-19 ENCOUNTER — Ambulatory Visit: Payer: Medicare Other | Attending: Family | Admitting: Family

## 2015-07-19 ENCOUNTER — Other Ambulatory Visit: Payer: Self-pay | Admitting: *Deleted

## 2015-07-19 VITALS — BP 115/40 | HR 99 | Resp 18 | Ht 72.0 in | Wt 213.0 lb

## 2015-07-19 DIAGNOSIS — Z9989 Dependence on other enabling machines and devices: Secondary | ICD-10-CM

## 2015-07-19 DIAGNOSIS — N189 Chronic kidney disease, unspecified: Secondary | ICD-10-CM | POA: Insufficient documentation

## 2015-07-19 DIAGNOSIS — E1122 Type 2 diabetes mellitus with diabetic chronic kidney disease: Secondary | ICD-10-CM

## 2015-07-19 DIAGNOSIS — Z7901 Long term (current) use of anticoagulants: Secondary | ICD-10-CM | POA: Diagnosis not present

## 2015-07-19 DIAGNOSIS — I130 Hypertensive heart and chronic kidney disease with heart failure and stage 1 through stage 4 chronic kidney disease, or unspecified chronic kidney disease: Secondary | ICD-10-CM | POA: Insufficient documentation

## 2015-07-19 DIAGNOSIS — Z794 Long term (current) use of insulin: Secondary | ICD-10-CM | POA: Diagnosis not present

## 2015-07-19 DIAGNOSIS — J449 Chronic obstructive pulmonary disease, unspecified: Secondary | ICD-10-CM | POA: Insufficient documentation

## 2015-07-19 DIAGNOSIS — Z87891 Personal history of nicotine dependence: Secondary | ICD-10-CM | POA: Diagnosis not present

## 2015-07-19 DIAGNOSIS — Z7982 Long term (current) use of aspirin: Secondary | ICD-10-CM | POA: Diagnosis not present

## 2015-07-19 DIAGNOSIS — I5032 Chronic diastolic (congestive) heart failure: Secondary | ICD-10-CM | POA: Insufficient documentation

## 2015-07-19 DIAGNOSIS — N183 Chronic kidney disease, stage 3 unspecified: Secondary | ICD-10-CM

## 2015-07-19 DIAGNOSIS — G4733 Obstructive sleep apnea (adult) (pediatric): Secondary | ICD-10-CM | POA: Diagnosis not present

## 2015-07-19 DIAGNOSIS — J439 Emphysema, unspecified: Secondary | ICD-10-CM

## 2015-07-19 DIAGNOSIS — Z7984 Long term (current) use of oral hypoglycemic drugs: Secondary | ICD-10-CM | POA: Diagnosis not present

## 2015-07-19 MED ORDER — FLUTICASONE-SALMETEROL 500-50 MCG/DOSE IN AEPB: 1.0000 | INHALATION_SPRAY | Freq: Two times a day (BID) | RESPIRATORY_TRACT | Status: DC

## 2015-07-19 NOTE — Patient Instructions (Signed)
Continue weighing daily and call for an overnight weight gain of > 2 pounds or a weekly weight gain of >5 pounds. 

## 2015-07-19 NOTE — Progress Notes (Signed)
Subjective:    Patient ID: Ernest Mclean, male    DOB: 11-22-1940, 75 y.o.   MRN: [id number]  Congestive Heart Failure Presents for initial visit. The disease course has been improving. Associated symptoms include fatigue and shortness of breath (with long distances walking). Pertinent negatives include no abdominal pain, chest pain, edema, orthopnea or palpitations. The symptoms have been improving. Past treatments include beta blockers, salt and fluid restriction and oxygen. The treatment provided moderate relief. Compliance with prior treatments has been good. His past medical history is significant for arrhythmia, CAD, chronic lung disease, DM and HTN. Compliance with total regimen is 76-100%.  Other This is a chronic (diabetes) problem. The current episode started more than 1 year ago. The problem occurs daily. 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 Pertinent negatives include no abdominal pain, chest pain, congestion, headaches, neck pain, sore throat or weakness. The symptoms are aggravated by eating.   Past Medical History  Diagnosis Date  . Diabetes ([place])   . Hypertension   . COPD (chronic obstructive pulmonary disease) ([place])   . Congestive heart failure ([place])   . OSA on CPAP   . Atrial fibrillation ([place])   . CKD (chronic kidney disease)   . CAD (coronary artery disease)     Past Surgical History  Procedure Laterality Date  . Heart bypass    . Hernia repair    . Clavicle surgery      Family History  Problem Relation Age of Onset  . Stroke    . Diabetes    . Breast cancer    . Colon cancer      Social History  Substance Use Topics  . Smoking status: Former Smoker -- 1.00 packs/day for 30 years    Types: Cigarettes  . Smokeless tobacco: Never Used     Comment: quit 12/14/2001  . Alcohol Use: No    Allergies  Allergen Reactions  . Hydralazine Other (See Comments)    tongue swollen and couldn't wake up ---- not positive  it was this or a mix of this with something else or high sugar  . Penicillins Other (See Comments)    Passed out (at 75 yrs old) Has patient had a PCN reaction causing immediate rash, facial/tongue/throat swelling, SOB or lightheadedness with hypotension: No Has patient had a PCN reaction causing severe rash involving mucus membranes or skin necrosis: No Has patient had a PCN reaction that required hospitalization No Has patient had a PCN reaction occurring within the last 10 years: No If all of the above answers are "NO", then may proceed with Cephalosporin use.   . Codeine Hives, Rash and Swelling    Prior to Admission medications   Medication Sig Start Date End Date Taking? Authorizing Provider  albuterol (PROVENTIL HFA;VENTOLIN HFA) 108 (90 BASE) MCG/ACT inhaler Inhale 2 puffs into the lungs every 4 (four) hours as needed for wheezing or shortness of breath. 10/29/14  Yes Vishal Mungal, MD  amLODipine (NORVASC) 5 MG tablet Take 5 mg by mouth daily.  08/17/14  Yes Historical Provider, MD  apixaban (ELIQUIS) 5 MG TABS tablet Take 5 mg by mouth 2 (two) times daily. 08/17/14  Yes Historical Provider, MD  aspirin EC 81 MG tablet Take 81 mg by mouth daily. 08/17/14  Yes Historical Provider, MD  citalopram (CELEXA) 20 MG tablet Take 20 mg by mouth at bedtime.  07/30/14  Yes Historical Provider, MD  CRANBERRY PO Take 820 mg by mouth daily.  Yes Historical Provider, MD  cyanocobalamin 500 MCG tablet Take 500 mcg by mouth daily.   Yes Historical Provider, MD  diazepam (VALIUM) 5 MG tablet Take 5 mg by mouth every 12 (twelve) hours as needed for anxiety.  10/24/13  Yes Historical Provider, MD  furosemide (LASIX) 40 MG tablet Take one tablet daily.  Can take extra tablet in afternoon if increase weight gain of 3 pounds or increased swelling. 07/11/15  Yes Richard Leslye Peer, MD  insulin lispro (HUMALOG) 100 UNIT/ML injection Inject 22-35 Units into the skin 3 (three) times daily before meals. 22 units before  breakfast, 25 units before lunch, and 35 units before supper.   Yes Historical Provider, MD  LANTUS 100 UNIT/ML injection Inject 0.75 mLs (75 Units total) into the skin at bedtime. Patient taking differently: Inject 70 Units into the skin at bedtime.  05/14/15  Yes Gladstone Lighter, MD  metFORMIN (GLUCOPHAGE) 500 MG tablet Take 500 mg by mouth 2 (two) times daily with a meal.   Yes Historical Provider, MD  metoprolol (LOPRESSOR) 100 MG tablet Take 100 mg by mouth 2 (two) times daily. 06/17/14  Yes Historical Provider, MD  OMEGA-3 FATTY ACIDS-VITAMIN E PO Take 1 capsule by mouth daily.   Yes Historical Provider, MD  omeprazole (PRILOSEC) 20 MG capsule Take 20 mg by mouth daily. 08/07/14  Yes Historical Provider, MD  pravastatin (PRAVACHOL) 20 MG tablet Take 20 mg by mouth at bedtime. 07/31/14  Yes Historical Provider, MD  tiotropium (SPIRIVA) 18 MCG inhalation capsule Place 18 mcg into inhaler and inhale daily. 07/31/14  Yes Historical Provider, MD  Fluticasone-Salmeterol (ADVAIR DISKUS) 500-50 MCG/DOSE AEPB Inhale 1 puff into the lungs 2 (two) times daily. 07/19/15   Vilinda Boehringer, MD      Review of Systems  Constitutional: Positive for fatigue. Negative for appetite change.  HENT: Negative for congestion, postnasal drip and sore throat.   Eyes: Negative.   Respiratory: Positive for shortness of breath (with long distances walking). Negative for chest tightness.   Cardiovascular: Negative for chest pain, palpitations and leg swelling.  Gastrointestinal: Positive for abdominal distention (improving). Negative for abdominal pain.  Endocrine: Negative.   Genitourinary: Negative.   Musculoskeletal: Negative for back pain and neck pain.  Skin: Negative.   Allergic/Immunologic: Negative.   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 Negative for dizziness, weakness, light-headedness and headaches.  Hematological: Negative for adenopathy. Bruises/bleeds easily.   Psychiatric/Behavioral: Negative for sleep disturbance (sleeping well with oxygen and CPAP on 2 pillows) and dysphoric mood. The patient is not nervous/anxious.        Objective:   Physical Exam  Constitutional: He is oriented to person, place, and time. He appears well-developed and well-nourished.  HENT:  Head: Normocephalic and atraumatic.  Eyes: Conjunctivae are normal. Pupils are equal, round, and reactive to light.  Neck: Normal range of motion. Neck supple.  Cardiovascular: An irregular rhythm present. Bradycardia present.   Pulmonary/Chest: Effort normal. He has no wheezes. He has no rales.  Abdominal: Soft. He exhibits distension. There is no tenderness.  Musculoskeletal: He exhibits no edema or tenderness.  Neurological: He is alert and oriented to person, place, and time.  Skin: Skin is warm and dry.  Psychiatric: He has a normal mood and affect. His behavior is normal. Thought content normal.  Nursing note and vitals reviewed.   BP 115/40 mmHg  Pulse 99  Resp 18  Ht 6' (1.829 m)  Wt 213 lb (96.616 kg)  BMI 28.88 kg/m2  SpO2 99%       Assessment & Plan:  1: Chronic heart failure with preserved ejection fraction- Patient presents with fatigue and shortness of breath when walking long distances. He was a little short of breath when walking into the office but once he sat down for a few minutes, his breathing improved. He denies any pedal edema and says that his abdominal swelling is improving. He is already weighing daily and says that his weight has been stable. Reminded patient to call for an overnight weight gain of >2 pounds or a weekly weight gain of >5 pounds. He is not adding any salt to his food and his wife is already diligently reading food labels. Discussed the importance of following a 2000mg  sodium diet. Low sodium cookbook was given to the patient.  2: Diabetes- Patient says that his glucose levels run 140-180's. Continues to use insulin.  3: COPD- Patient  is wearing oxygen at 2L around the clock. He also continues to use his inhalers. He has an appointment with his pulmonologist coming up.  4: Obstructive sleep apnea- He wears his CPAP on a nightly basis. Feels like he's sleeping well.   Return in 1 month or sooner for any questions/problems before then.

## 2015-07-20 DIAGNOSIS — I5032 Chronic diastolic (congestive) heart failure: Secondary | ICD-10-CM | POA: Insufficient documentation

## 2015-08-05 ENCOUNTER — Ambulatory Visit: Payer: Medicare Other

## 2015-08-10 ENCOUNTER — Encounter: Payer: Self-pay | Admitting: Internal Medicine

## 2015-08-10 ENCOUNTER — Ambulatory Visit (INDEPENDENT_AMBULATORY_CARE_PROVIDER_SITE_OTHER): Payer: Medicare Other | Admitting: Internal Medicine

## 2015-08-10 VITALS — BP 142/80 | HR 56 | Ht 72.0 in | Wt 212.4 lb

## 2015-08-10 DIAGNOSIS — R911 Solitary pulmonary nodule: Secondary | ICD-10-CM

## 2015-08-10 DIAGNOSIS — G4733 Obstructive sleep apnea (adult) (pediatric): Secondary | ICD-10-CM

## 2015-08-10 DIAGNOSIS — J9610 Chronic respiratory failure, unspecified whether with hypoxia or hypercapnia: Secondary | ICD-10-CM | POA: Insufficient documentation

## 2015-08-10 DIAGNOSIS — Z9989 Dependence on other enabling machines and devices: Secondary | ICD-10-CM

## 2015-08-10 DIAGNOSIS — J439 Emphysema, unspecified: Secondary | ICD-10-CM | POA: Diagnosis not present

## 2015-08-10 DIAGNOSIS — J9611 Chronic respiratory failure with hypoxia: Secondary | ICD-10-CM

## 2015-08-10 DIAGNOSIS — I5032 Chronic diastolic (congestive) heart failure: Secondary | ICD-10-CM

## 2015-08-10 NOTE — Progress Notes (Signed)
[facility] Consultation      MRN# [id number] Ernest Mclean Oct 26, 1940   CC: Chief Complaint  Patient presents with  . Follow-up    pt. states he was in hosp 06/2015 for fluid in lungs. c/o occ dry cough. denies SOB, wheezing or chest pain/chestness      Brief History: 08/17/14 HPI 75 year old man who was recently discharged from [facility] after admission for respiratory failure attributed to pneumonia, chronic obstructive pulmonary disease, diastolic heart failure seen for hospital followup today. He states that initially after his discharge on 02/05, he felt well until approximately 07/07/2014. At that time, for reasons unknown, he began feeling extremely weak, and noting that his blood pressure was low particularly after taking his morning medications. His wife carries with her a log of blood pressures and blood sugars and on 07/07/2014 notes a blood pressure 80s/60s. His heart rate has also progressively increased over this time. He has had multiple readings as low as that over the past few days with readings only as high as 120/80. Four days prior he developed diarrhea, having 3 to 4 very large watery bowel movements per day. He started vomiting last night and vomited throughout the night. No hematemesis, melena, or hematochezia. His wife has been trying to encourage fluid intake and states that he has actually gained 4 pounds over the past few days. At the time of presentation to the Emergency Room, he was in afib with RVR at 130-140, hypotensive, with blood pressures in the 90s over 70s and very weak. Blood sugars also elevated at 425. Currently, he states that he is doing well. He has a history of OSA, on CPAP with 2L O2 bleed in. Over the past 1 month he has had 3 admission to [facility] for various reason (sob, weakness, pneumonia, bronchitis). He has a smoking history of 1-2ppdx15 years (age [age]). Further history reveals that 3-4 months ago he was in his usual states  of health with no significant dyspnea, cough, sputum production or requiring inhalers. No significant cough, worsening sob, or sputum production today. He is accompanied by his wife today. He states that overall he is doing well, his oxygen saturation on room air is greater than 88%. Patient is a history of diabetes, and claudication of the lower extremities, wife and patient has stated that since starting supplemental oxygen his claudication has improved. However, one 6 minute walk test today and at rest on room air he did not desaturate less than 88%.  Plan - cont with CPAP, repeat CT prior to follow up, exercise, cont with O2    ROV 10/29/14 Patient presents today for a followup visit, he is accompanied by his wife. Overall patient states that his breathing is rapidly improve, his wife states that she does hear some audible wheezing with exertion. Patient states he can climb about 2 flights of stairs, but he still winded afterwards. He is currently on Lasix, Advair, Spiriva. He has been compliant with these medications. He did have an overnight pulse oximetry testing done that showed 80 events between 89 and 85% saturation, 2 L of supplemental oxygen was ordered at night for him. He has a history of obstructive sleep apnea wearing his CPAP machine nightly. Given his last admission with pneumonia and bronchitis, he had a repeat CT schedule, which will be performed on 11/02/2014. Plan: - pulmonary function testing and 6 minute walk test prior to follow up - complete out advair (250/50) prescripton, and then start new prescription as stated  below - increase Advair to 500/50 (90 day rx) - 1 puff in the AM and PM - gargle and rinse after each use.  - cont with Spiriva as directed.  - albuterol rescue inhaler - 2puff every 3-4 hours as needed for shortness of breath\wheezing\recurrent cough - may use albuterol rescue inhaler, 2 puffs, 15 mins prior to any exertional activities (lawn care,  exercise, etc).  - followup CT results on Monday, 11/02/2014 -Continue with supplemental oxygen (2 L) with CPAP at night -CPAP compliance discussed with patient, overall doing well.  Events since last clinic visit: Patient presents today for a follow up visit of his COPD and recent hospitalization for AECOPD and CHF exacerbation. Brief hospitalization at [facility] from 2/17-2/19 (see below) Previously wore O2 at night only, now advised to wear O2 continuously since discharge. His last 30mwt in Sept 2016 showed that he did not require continuous O2. Overall patient states that he's feeling well, he does not like having to be on continuous oxygen. He is accompanied by his wife today. Wife states when he is not wearing oxygen she noticed that he wheezes more, becomes more confused and starts stumbling, which quickly resolves once he puts his oxygen on.  [facility]ization 2/17-2/19 (reviewed by Dr. Stevenson Clinch) 1. Acute respiratory failure now chronic respiratory failure. Pulse ox 84% on room air after ambulation. This qualifies the patient for 24/7 oxygen at home. The patient was advised to wear his oxygen at all times. Previously he just had it at night. With the oxygen on, his mental status has been better. 2. Acute on chronic diastolic heart failure. Patient was diuresed with IV Lasix. His oral Lasix upon going home will be increased to 40 mg daily. He can take an extra Lasix in the afternoon if weight gain of 3 pounds or increased swelling. The patient will continue his metoprolol. 3. Possible pneumonia. Finish up course of Levaquin orally 4. Atrial fibrillation anticoagulated with eliquis 5. Chronic kidney disease stage III 6. Hyperlipidemia unspecified- continue statin 7. Type 2 diabetes- continue insulin regimen and DC Glucophage  Medication:   Current Outpatient Rx  Name  Route  Sig  Dispense  Refill  . albuterol (PROVENTIL HFA;VENTOLIN HFA) 108 (90 BASE) MCG/ACT inhaler   Inhalation   Inhale  2 puffs into the lungs every 4 (four) hours as needed for wheezing or shortness of breath.   1 Inhaler   2   . amLODipine (NORVASC) 5 MG tablet   Oral   Take 5 mg by mouth daily.          Marland Kitchen apixaban (ELIQUIS) 5 MG TABS tablet   Oral   Take 5 mg by mouth 2 (two) times daily.         Marland Kitchen aspirin EC 81 MG tablet   Oral   Take 81 mg by mouth daily.         . citalopram (CELEXA) 20 MG tablet   Oral   Take 20 mg by mouth at bedtime.       5   . CRANBERRY PO   Oral   Take 820 mg by mouth daily.         . cyanocobalamin 500 MCG tablet   Oral   Take 500 mcg by mouth daily.         . diazepam (VALIUM) 5 MG tablet   Oral   Take 5 mg by mouth every 12 (twelve) hours as needed for anxiety.          Marland Kitchen  Fluticasone-Salmeterol (ADVAIR DISKUS) 500-50 MCG/DOSE AEPB   Inhalation   Inhale 1 puff into the lungs 2 (two) times daily.   60 each   5   . furosemide (LASIX) 40 MG tablet      Take one tablet daily.  Can take extra tablet in afternoon if increase weight gain of 3 pounds or increased swelling.   30 tablet   60   . insulin lispro (HUMALOG) 100 UNIT/ML injection   Subcutaneous   Inject 22-35 Units into the skin 3 (three) times daily before meals. 22 units before breakfast, 25 units before lunch, and 35 units before supper.         Marland Kitchen LANTUS 100 UNIT/ML injection   Subcutaneous   Inject 0.75 mLs (75 Units total) into the skin at bedtime. Patient taking differently: Inject 70 Units into the skin at bedtime.    10 mL   5     Dispense as written.   . metFORMIN (GLUCOPHAGE) 500 MG tablet   Oral   Take 500 mg by mouth 2 (two) times daily with a meal.         . metoprolol (LOPRESSOR) 100 MG tablet   Oral   Take 100 mg by mouth 2 (two) times daily.      0   . OMEGA-3 FATTY ACIDS-VITAMIN E PO   Oral   Take 1 capsule by mouth daily.         Marland Kitchen omeprazole (PRILOSEC) 20 MG capsule   Oral   Take 20 mg by mouth daily.      3   . pravastatin (PRAVACHOL)  20 MG tablet   Oral   Take 20 mg by mouth at bedtime.      3   . tiotropium (SPIRIVA) 18 MCG inhalation capsule   Inhalation   Place 18 mcg into inhaler and inhale daily.            Review of Systems  Constitutional: Negative for fever, chills and weight loss.  HENT: Negative for hearing loss.   Eyes: Negative for blurred vision.  Respiratory: Positive for shortness of breath. Negative for cough, sputum production and wheezing.        Improving SOB, now mostly with exertion  Cardiovascular: Positive for claudication. Negative for chest pain, palpitations and orthopnea.  Gastrointestinal: Negative for heartburn and nausea.  Genitourinary: Negative for dysuria.  Musculoskeletal: Positive for myalgias.       Chronic leg pain with exercise or exertion, has PVD/PAD  Skin: Negative for rash.  Neurological: Negative for headaches.      Allergies:  Hydralazine; Penicillins; and Codeine  Physical Examination:  VS: BP 142/80 mmHg  Pulse 56  Ht 6' (1.829 m)  Wt 212 lb 6.4 oz (96.344 kg)  BMI 28.80 kg/m2  SpO2 100%  General Appearance: No distress  HEENT: PERRLA, no ptosis, no other lesions noticed Pulmonary:normal breath sounds., diaphragmatic excursion normal.No wheezing, No rales   Cardiovascular:  Normal S1,S2.  No m/r/g.     Abdomen:Exam: Benign, Soft, non-tender, No masses  Skin:   warm, no rashes, no ecchymosis  Extremities: normal, no cyanosis, clubbing, warm with normal capillary refill.      Rad results: (The following images and results were reviewed by Dr. Stevenson Clinch). CT Chest 10/2014 IMPRESSION: Most of the airspace opacity has cleared compared to the previous study. A small amount of opacity remains in the lingula, stable. This area may represent chronic atelectasis. No new airspace disease. Mild atelectasis is also noted  in the left base.  4 mm nodular opacity is noted in the superior segment left lower lobe. Followup of this nodular opacity should be based  on Fleischner Society guidelines. If the patient is at high risk for bronchogenic carcinoma, follow-up chest CT at 1 year is recommended. If the patient is at low risk, no follow-up is needed. This recommendation follows the consensus statement: Guidelines for Management of Small Pulmonary Nodules Detected on CT Scans: A Statement from the [employer] as published in Radiology 2005; 237:395-400.    6MWT - 02/10/15 - distance: 281m - lowest saturation: 97% - no complaints during walk, no need for supplemental O2 with exertion  PFTs 02/10/15 postBD FEV1 75% postBD FVC 70% FEV1/FVC 77% RV 147 TLC 126 RV/TLC 110 DLCO - unable toperform Impression - decrease FEV1, preserved FEV1/FVC ratio. Mild/Mod obstruction on inhalers, air trapping/hyperinflation noted.  Significant response to BD, >252ml on FVC and FEV1 post BD   (The following images and results were reviewed by Dr. Stevenson Clinch on 08/10/2015).  CXR 2view 07/09/15 COMPARISON: 07/12/2014  FINDINGS: Post median sternotomy. Enlarged interstitial lung markings bilaterally, particularly on the right side. Heart size is within normal limits. No large pleural effusions. The trachea is midline. Old right rib fractures.  IMPRESSION: Prominent interstitial lung densities bilaterally, right side greater than left. Differential diagnosis includes asymmetric pulmonary edema versus atypical pneumonia.   Assessment and Plan: 75 year old male presents for follow-up visit of COPD, recent admission for CHF exacerbation and acute COPD exacerbation now on 2 L continuous oxygen Chronic diastolic heart failure ([place]) Secondary to CHF and COPD Recently admitted for AECOPD and CHF exacerbation, his walk test in the hospital showed that he desaturated down to 81% requiring 2 L  Plan - 2L O2 continuously, may take short breaks while watching TV or being sedentary - cont with O2 with cpap   COPD (chronic obstructive pulmonary disease)  (HCC) COPD Overall fairly stable, I believe that his O2 requirement continuously now is more likely due to heart failure/heart failure exacerbation  01/2015 PFTs  - preserved FEV1/FVC ratio, airtrapping and hyperinflation, significant response to BD.  6MWt  - 262m, no desats <88%, no need for supplemental O2.   Plan: - cont with Advair to 500/50 (90 day rx) - 1 puff in the AM and PM - gargle and rinse after each use.  - cont with Spiriva as directed.  - albuterol rescue inhaler - 2puff every 3-4 hours as needed for shortness of breath\wheezing\recurrent cough - may use albuterol rescue inhaler, 2 puffs, 15 mins prior to any exertional activities (lawn care, exercise, etc).  - Oxygen via nasal cannula, 2 L continuously, may take breaks when sedentary (watching TV sitting on couch, etc.) disease.         OSA on CPAP Patient with a known history of OSA on CPAP.  Overnight pulse oximetry showed multiple events (80) occurring between 89 and 85% saturation. Continue with 2 L supplemental oxygen on CPAP.   Plan: -Continue with supplemental oxygen (2 L) with CPAP at night -CPAP compliance discussed with patient, overall doing well.        Solitary pulmonary nodule Small subcentimeter nodule - LUL 19mm  Plan - repeat with CT Chest with contrast in 01/2016      Updated Medication List Outpatient Encounter Prescriptions as of 08/10/2015  Medication Sig  . albuterol (PROVENTIL HFA;VENTOLIN HFA) 108 (90 BASE) MCG/ACT inhaler Inhale 2 puffs into the lungs every 4 (four) hours as needed for wheezing or  shortness of breath.  Marland Kitchen amLODipine (NORVASC) 5 MG tablet Take 5 mg by mouth daily.   Marland Kitchen apixaban (ELIQUIS) 5 MG TABS tablet Take 5 mg by mouth 2 (two) times daily.  Marland Kitchen aspirin EC 81 MG tablet Take 81 mg by mouth daily.  . citalopram (CELEXA) 20 MG tablet Take 20 mg by mouth at bedtime.   Marland Kitchen CRANBERRY PO Take 820 mg by mouth daily.  . cyanocobalamin 500 MCG tablet Take 500 mcg by  mouth daily.  . diazepam (VALIUM) 5 MG tablet Take 5 mg by mouth every 12 (twelve) hours as needed for anxiety.   . Fluticasone-Salmeterol (ADVAIR DISKUS) 500-50 MCG/DOSE AEPB Inhale 1 puff into the lungs 2 (two) times daily.  . furosemide (LASIX) 40 MG tablet Take one tablet daily.  Can take extra tablet in afternoon if increase weight gain of 3 pounds or increased swelling.  . insulin lispro (HUMALOG) 100 UNIT/ML injection Inject 22-35 Units into the skin 3 (three) times daily before meals. 22 units before breakfast, 25 units before lunch, and 35 units before supper.  Marland Kitchen LANTUS 100 UNIT/ML injection Inject 0.75 mLs (75 Units total) into the skin at bedtime. (Patient taking differently: Inject 70 Units into the skin at bedtime. )  . metFORMIN (GLUCOPHAGE) 500 MG tablet Take 500 mg by mouth 2 (two) times daily with a meal.  . metoprolol (LOPRESSOR) 100 MG tablet Take 100 mg by mouth 2 (two) times daily.  . OMEGA-3 FATTY ACIDS-VITAMIN E PO Take 1 capsule by mouth daily.  Marland Kitchen omeprazole (PRILOSEC) 20 MG capsule Take 20 mg by mouth daily.  . pravastatin (PRAVACHOL) 20 MG tablet Take 20 mg by mouth at bedtime.  Marland Kitchen tiotropium (SPIRIVA) 18 MCG inhalation capsule Place 18 mcg into inhaler and inhale daily.   No facility-administered encounter medications on file as of 08/10/2015.    Orders for this visit: Orders Placed This Encounter  Procedures  . 6 minute walk    VM wants pt. To walk about 55min.    Standing Status: Future     Number of Occurrences:      Standing Expiration Date: 08/09/2016    Order Specific Question:  Where should this test be performed?    Answer:  Other    Thank  you for the visitation and for allowing  [facility] to assist in the care of your patient. Our recommendations are noted above.  Please contact us if we can be of further service.  Vilinda Boehringer, MD [facility] Office Number: [phone]

## 2015-08-10 NOTE — Patient Instructions (Addendum)
Follow up with Dr. Stevenson Clinch in: 13months - walk test prior to next visit (we will walk you for about 10 mins) - cont with 2L O2 continuously, may take breaks if sedentary for short periods - cont with CHF meds - cont with Advair, spiriva and as needed albuterol - monitor salt intake.

## 2015-08-10 NOTE — Assessment & Plan Note (Signed)
Small subcentimeter nodule - LUL 52mm  Plan - repeat with CT Chest with contrast in 01/2016

## 2015-08-10 NOTE — Assessment & Plan Note (Signed)
Patient with a known history of OSA on CPAP.  Overnight pulse oximetry showed multiple events (80) occurring between 89 and 85% saturation. Continue with 2 L supplemental oxygen on CPAP.   Plan: -Continue with supplemental oxygen (2 L) with CPAP at night -CPAP compliance discussed with patient, overall doing well.   

## 2015-08-10 NOTE — Assessment & Plan Note (Signed)
COPD Overall fairly stable, I believe that his O2 requirement continuously now is more likely due to heart failure/heart failure exacerbation  01/2015 PFTs  - preserved FEV1/FVC ratio, airtrapping and hyperinflation, significant response to BD.  6MWt  - 238m, no desats <88%, no need for supplemental O2.   Plan: - cont with Advair to 500/50 (90 day rx) - 1 puff in the AM and PM - gargle and rinse after each use.  - cont with Spiriva as directed.  - albuterol rescue inhaler - 2puff every 3-4 hours as needed for shortness of breath\wheezing\recurrent cough - may use albuterol rescue inhaler, 2 puffs, 15 mins prior to any exertional activities (lawn care, exercise, etc).  - Oxygen via nasal cannula, 2 L continuously, may take breaks when sedentary (watching TV sitting on couch, etc.) disease.

## 2015-08-10 NOTE — Assessment & Plan Note (Signed)
Secondary to CHF and COPD Recently admitted for AECOPD and CHF exacerbation, his walk test in the hospital showed that he desaturated down to 81% requiring 2 L  Plan - 2L O2 continuously, may take short breaks while watching TV or being sedentary - cont with O2 with cpap

## 2015-08-19 ENCOUNTER — Ambulatory Visit: Payer: Medicare Other | Attending: Family | Admitting: Family

## 2015-08-19 ENCOUNTER — Encounter: Payer: Self-pay | Admitting: Family

## 2015-08-19 VITALS — BP 147/74 | HR 63 | Resp 18 | Ht 72.0 in | Wt 215.0 lb

## 2015-08-19 DIAGNOSIS — I110 Hypertensive heart disease with heart failure: Secondary | ICD-10-CM | POA: Diagnosis not present

## 2015-08-19 DIAGNOSIS — R5383 Other fatigue: Secondary | ICD-10-CM | POA: Insufficient documentation

## 2015-08-19 DIAGNOSIS — Z951 Presence of aortocoronary bypass graft: Secondary | ICD-10-CM | POA: Insufficient documentation

## 2015-08-19 DIAGNOSIS — Z7901 Long term (current) use of anticoagulants: Secondary | ICD-10-CM | POA: Diagnosis not present

## 2015-08-19 DIAGNOSIS — E1122 Type 2 diabetes mellitus with diabetic chronic kidney disease: Secondary | ICD-10-CM

## 2015-08-19 DIAGNOSIS — J449 Chronic obstructive pulmonary disease, unspecified: Secondary | ICD-10-CM | POA: Diagnosis not present

## 2015-08-19 DIAGNOSIS — Z79899 Other long term (current) drug therapy: Secondary | ICD-10-CM | POA: Diagnosis not present

## 2015-08-19 DIAGNOSIS — I129 Hypertensive chronic kidney disease with stage 1 through stage 4 chronic kidney disease, or unspecified chronic kidney disease: Secondary | ICD-10-CM | POA: Diagnosis not present

## 2015-08-19 DIAGNOSIS — I252 Old myocardial infarction: Secondary | ICD-10-CM | POA: Diagnosis not present

## 2015-08-19 DIAGNOSIS — Z7982 Long term (current) use of aspirin: Secondary | ICD-10-CM | POA: Diagnosis not present

## 2015-08-19 DIAGNOSIS — I499 Cardiac arrhythmia, unspecified: Secondary | ICD-10-CM | POA: Diagnosis not present

## 2015-08-19 DIAGNOSIS — I2510 Atherosclerotic heart disease of native coronary artery without angina pectoris: Secondary | ICD-10-CM | POA: Diagnosis not present

## 2015-08-19 DIAGNOSIS — Z888 Allergy status to other drugs, medicaments and biological substances status: Secondary | ICD-10-CM | POA: Diagnosis not present

## 2015-08-19 DIAGNOSIS — Z803 Family history of malignant neoplasm of breast: Secondary | ICD-10-CM | POA: Diagnosis not present

## 2015-08-19 DIAGNOSIS — Z87891 Personal history of nicotine dependence: Secondary | ICD-10-CM | POA: Insufficient documentation

## 2015-08-19 DIAGNOSIS — Z7951 Long term (current) use of inhaled steroids: Secondary | ICD-10-CM | POA: Insufficient documentation

## 2015-08-19 DIAGNOSIS — I4891 Unspecified atrial fibrillation: Secondary | ICD-10-CM | POA: Insufficient documentation

## 2015-08-19 DIAGNOSIS — N183 Chronic kidney disease, stage 3 (moderate): Secondary | ICD-10-CM

## 2015-08-19 DIAGNOSIS — I10 Essential (primary) hypertension: Secondary | ICD-10-CM

## 2015-08-19 DIAGNOSIS — I5032 Chronic diastolic (congestive) heart failure: Secondary | ICD-10-CM | POA: Diagnosis not present

## 2015-08-19 DIAGNOSIS — Z9889 Other specified postprocedural states: Secondary | ICD-10-CM | POA: Insufficient documentation

## 2015-08-19 DIAGNOSIS — Z823 Family history of stroke: Secondary | ICD-10-CM | POA: Diagnosis not present

## 2015-08-19 DIAGNOSIS — Z833 Family history of diabetes mellitus: Secondary | ICD-10-CM | POA: Insufficient documentation

## 2015-08-19 DIAGNOSIS — Z7984 Long term (current) use of oral hypoglycemic drugs: Secondary | ICD-10-CM | POA: Diagnosis not present

## 2015-08-19 DIAGNOSIS — G4733 Obstructive sleep apnea (adult) (pediatric): Secondary | ICD-10-CM | POA: Diagnosis not present

## 2015-08-19 DIAGNOSIS — Z794 Long term (current) use of insulin: Secondary | ICD-10-CM | POA: Insufficient documentation

## 2015-08-19 DIAGNOSIS — N189 Chronic kidney disease, unspecified: Secondary | ICD-10-CM | POA: Diagnosis not present

## 2015-08-19 DIAGNOSIS — J439 Emphysema, unspecified: Secondary | ICD-10-CM

## 2015-08-19 DIAGNOSIS — Z880 Allergy status to penicillin: Secondary | ICD-10-CM | POA: Insufficient documentation

## 2015-08-19 NOTE — Progress Notes (Signed)
Subjective:    Patient ID: Ernest Mclean, male    DOB: [birth year], 75 y.o.   MRN: [id number]  Congestive Heart Failure Presents for follow-up visit. The disease course has been improving. Associated symptoms include fatigue. Pertinent negatives include no abdominal pain, chest pain, chest pressure, edema, orthopnea, palpitations or shortness of breath. The symptoms have been improving. Past treatments include beta blockers, oxygen and salt and fluid restriction. The treatment provided moderate relief. Compliance with prior treatments has been good. His past medical history is significant for arrhythmia, CAD, chronic lung disease, DM and HTN.  Hypertension This is a chronic problem. The current episode started more than 1 year ago. The problem is unchanged. The problem is controlled. Pertinent negatives include no chest pain, neck pain, palpitations, peripheral edema or shortness of breath. There are no associated agents to hypertension. Risk factors for coronary artery disease include diabetes mellitus and male gender. Past treatments include beta blockers, calcium channel blockers, diuretics and lifestyle changes. The current treatment provides moderate improvement. Compliance problems include exercise.  Hypertensive end-organ damage includes kidney disease, CAD/MI and heart failure.   Past Medical History  Diagnosis Date  . Diabetes ([place])   . Hypertension   . COPD (chronic obstructive pulmonary disease) ([place])   . Congestive heart failure ([place])   . OSA on CPAP   . Atrial fibrillation ([place])   . CKD (chronic kidney disease)   . CAD (coronary artery disease)     Past Surgical History  Procedure Laterality Date  . Heart bypass    . Hernia repair    . Clavicle surgery      Family History  Problem Relation Age of Onset  . Stroke    . Diabetes    . Breast cancer    . Colon cancer      Social History  Substance Use Topics  . Smoking status: Former Smoker -- 1.00 packs/day for 30  years    Types: Cigarettes  . Smokeless tobacco: Never Used     Comment: quit 12/14/2001  . Alcohol Use: No    Allergies  Allergen Reactions  . Hydralazine Other (See Comments)    tongue swollen and couldn't wake up ---- not positive it was this or a mix of this with something else or high sugar  . Penicillins Other (See Comments)    Passed out (at 75 yrs old) Has patient had a PCN reaction causing immediate rash, facial/tongue/throat swelling, SOB or lightheadedness with hypotension: No Has patient had a PCN reaction causing severe rash involving mucus membranes or skin necrosis: No Has patient had a PCN reaction that required hospitalization No Has patient had a PCN reaction occurring within the last 10 years: No If all of the above answers are "NO", then may proceed with Cephalosporin use.   . Codeine Hives, Rash and Swelling    Prior to Admission medications   Medication Sig Start Date End Date Taking? Authorizing Provider  albuterol (PROVENTIL HFA;VENTOLIN HFA) 108 (90 BASE) MCG/ACT inhaler Inhale 2 puffs into the lungs every 4 (four) hours as needed for wheezing or shortness of breath. 10/29/14  Yes Vishal Mungal, MD  amLODipine (NORVASC) 5 MG tablet Take 5 mg by mouth daily.  08/17/14  Yes Historical Provider, MD  apixaban (ELIQUIS) 5 MG TABS tablet Take 5 mg by mouth 2 (two) times daily. 08/17/14  Yes Historical Provider, MD  aspirin EC 81 MG tablet Take 81 mg by mouth daily. 08/17/14  Yes Historical Provider, MD  citalopram (CELEXA) 20 MG tablet Take 20 mg by mouth at bedtime.  07/30/14  Yes Historical Provider, MD  CRANBERRY PO Take 820 mg by mouth daily.   Yes Historical Provider, MD  cyanocobalamin 500 MCG tablet Take 500 mcg by mouth daily.   Yes Historical Provider, MD  diazepam (VALIUM) 5 MG tablet Take 5 mg by mouth every 12 (twelve) hours as needed for anxiety.  10/24/13  Yes Historical Provider, MD  Fluticasone-Salmeterol (ADVAIR DISKUS) 500-50 MCG/DOSE AEPB Inhale 1 puff into  the lungs 2 (two) times daily. 07/19/15  Yes Vishal Mungal, MD  furosemide (LASIX) 40 MG tablet Take one tablet daily.  Can take extra tablet in afternoon if increase weight gain of 3 pounds or increased swelling. 07/11/15  Yes Richard Leslye Peer, MD  insulin lispro (HUMALOG) 100 UNIT/ML injection Inject 22-35 Units into the skin 3 (three) times daily before meals. 22 units before breakfast, 25 units before lunch, and 35 units before supper.   Yes Historical Provider, MD  LANTUS 100 UNIT/ML injection Inject 0.75 mLs (75 Units total) into the skin at bedtime. Patient taking differently: Inject 70 Units into the skin at bedtime.  05/14/15  Yes Gladstone Lighter, MD  metFORMIN (GLUCOPHAGE) 500 MG tablet Take 500 mg by mouth 2 (two) times daily with a meal.   Yes Historical Provider, MD  metoprolol (LOPRESSOR) 100 MG tablet Take 100 mg by mouth 2 (two) times daily. 06/17/14  Yes Historical Provider, MD  OMEGA-3 FATTY ACIDS-VITAMIN E PO Take 1 capsule by mouth daily.   Yes Historical Provider, MD  omeprazole (PRILOSEC) 20 MG capsule Take 20 mg by mouth daily. 08/07/14  Yes Historical Provider, MD  pravastatin (PRAVACHOL) 20 MG tablet Take 20 mg by mouth at bedtime. 07/31/14  Yes Historical Provider, MD  tiotropium (SPIRIVA) 18 MCG inhalation capsule Place 18 mcg into inhaler and inhale daily. 07/31/14  Yes Historical Provider, MD      Review of Systems  Constitutional: Positive for fatigue. Negative for appetite change.  HENT: Negative for congestion, postnasal drip and sore throat.   Eyes: Negative.   Respiratory: Negative for cough, chest tightness, shortness of breath and wheezing.   Cardiovascular: Negative for chest pain, palpitations and leg swelling.  Gastrointestinal: Negative for abdominal pain and abdominal distention.  Endocrine: Negative.   Genitourinary: Negative.   Musculoskeletal: Negative for back pain and neck pain.  Skin: Negative.   Allergic/Immunologic: Negative.   Neurological:  Positive for numbness (& tingling in lower legs when walking). Negative for dizziness and light-headedness.  Hematological: Negative for adenopathy. Does not bruise/bleed easily.  Psychiatric/Behavioral: Negative for sleep disturbance (wearing CPAP at night) and dysphoric mood. The patient is not nervous/anxious.        Objective:   Physical Exam  Constitutional: He is oriented to person, place, and time. He appears well-developed and well-nourished.  HENT:  Head: Normocephalic and atraumatic.  Eyes: Conjunctivae are normal. Pupils are equal, round, and reactive to light.  Neck: Normal range of motion. Neck supple.  Cardiovascular: Regular rhythm.  Bradycardia present.   Pulmonary/Chest: Effort normal. He has no wheezes. He has no rales.  Abdominal: Soft. He exhibits no distension. There is no tenderness.  Musculoskeletal: He exhibits no edema or tenderness.  Neurological: He is alert and oriented to person, place, and time.  Skin: Skin is warm and dry.  Psychiatric: He has a normal mood and affect. His behavior is normal. Thought content normal.  Nursing note and vitals reviewed.   BP 147/74  mmHg  Pulse 63  Resp 18  Ht 6' (1.829 m)  Wt 215 lb (97.523 kg)  BMI 29.15 kg/m2  SpO2 100%       Assessment & Plan:  1: Chronic heart failure with preserved ejection fraction- Patient presents with fatigue upon exertion which does improve upon rest. He denied being tired walking into the office today. Denies any shortness of breath or pedal edema. He continues to weigh himself and says that his weight has been stable. By our scale, he's gained 2 pounds since he was here last. Reminded to call for an overnight weight gain of >2 pounds or a weekly weight gain of >5 pounds. He is not adding any salt to his food and his wife is very diligent about how she cooks. She reads food labels all the time and tries to keep his sodium content at 1500-1800mg  daily. [facility] Pharm[facility] went in and reviewed  medications with the patient and his wife.  2: HTN- Blood pressure looks good today. Continue medications at this time. 3: COPD- He's still wearing his oxygen at 2L around the clock. Has recently seen his pulmonologist and is due to return for a 10 minutes walk test without his oxygen on. Can go 5-10 minutes without his oxygen on without his pulse ox dropping. 4: Diabetes- He says that his glucose level this morning was 203. Did bottom out to the 50's the night before in the middle of the night. Does follow closely with his PCP regarding this.   Return here in 3 months or sooner for any questions/problems before then.

## 2015-08-19 NOTE — Patient Instructions (Signed)
Continue weighing daily and call for an overnight weight gain of > 2 pounds or a weekly weight gain of >5 pounds. 

## 2015-09-20 ENCOUNTER — Ambulatory Visit (INDEPENDENT_AMBULATORY_CARE_PROVIDER_SITE_OTHER): Payer: Medicare Other | Admitting: *Deleted

## 2015-09-20 DIAGNOSIS — J439 Emphysema, unspecified: Secondary | ICD-10-CM

## 2015-09-20 NOTE — Progress Notes (Signed)
Pt here for a 10 minute walk per VM.

## 2015-09-22 ENCOUNTER — Encounter: Payer: Self-pay | Admitting: Internal Medicine

## 2015-09-22 ENCOUNTER — Ambulatory Visit (INDEPENDENT_AMBULATORY_CARE_PROVIDER_SITE_OTHER): Payer: Medicare Other | Admitting: Internal Medicine

## 2015-09-22 VITALS — BP 128/78 | HR 63 | Wt 217.0 lb

## 2015-09-22 DIAGNOSIS — G4733 Obstructive sleep apnea (adult) (pediatric): Secondary | ICD-10-CM | POA: Diagnosis not present

## 2015-09-22 DIAGNOSIS — R911 Solitary pulmonary nodule: Secondary | ICD-10-CM

## 2015-09-22 DIAGNOSIS — J9611 Chronic respiratory failure with hypoxia: Secondary | ICD-10-CM | POA: Diagnosis not present

## 2015-09-22 DIAGNOSIS — J439 Emphysema, unspecified: Secondary | ICD-10-CM

## 2015-09-22 DIAGNOSIS — Z9989 Dependence on other enabling machines and devices: Principal | ICD-10-CM

## 2015-09-22 NOTE — Progress Notes (Signed)
[facility] Consultation      MRN# [id number] Ernest Mclean [birth year]   CC: Chief Complaint  Patient presents with  . Follow-up    Walk test results; no SOB, chest tightness; gets leg cramps       Brief History: 08/17/14 HPI 75 year old man who was recently discharged from [facility] after admission for respiratory failure attributed to pneumonia, chronic obstructive pulmonary disease, diastolic heart failure seen for hospital followup today. He states that initially after his discharge on 02/05, he felt well until approximately 07/07/2014. At that time, for reasons unknown, he began feeling extremely weak, and noting that his blood pressure was low particularly after taking his morning medications. His wife carries with her a log of blood pressures and blood sugars and on 07/07/2014 notes a blood pressure 80s/60s. His heart rate has also progressively increased over this time. He has had multiple readings as low as that over the past few days with readings only as high as 120/80. Four days prior he developed diarrhea, having 3 to 4 very large watery bowel movements per day. He started vomiting last night and vomited throughout the night. No hematemesis, melena, or hematochezia. His wife has been trying to encourage fluid intake and states that he has actually gained 4 pounds over the past few days. At the time of presentation to the Emergency Room, he was in afib with RVR at 130-140, hypotensive, with blood pressures in the 90s over 70s and very weak. Blood sugars also elevated at 425. Currently, he states that he is doing well. He has a history of OSA, on CPAP with 2L O2 bleed in. Over the past 1 month he has had 3 admission to [facility] for various reason (sob, weakness, pneumonia, bronchitis). He has a smoking history of 1-2ppdx15 years (age 50-40). Further history reveals that 3-4 months ago he was in his usual states of health with no significant dyspnea, cough, sputum  production or requiring inhalers. No significant cough, worsening sob, or sputum production today. He is accompanied by his wife today. He states that overall he is doing well, his oxygen saturation on room air is greater than 88%. Patient is a history of diabetes, and claudication of the lower extremities, wife and patient has stated that since starting supplemental oxygen his claudication has improved. However, one 6 minute walk test today and at rest on room air he did not desaturate less than 88%.  Plan - cont with CPAP, repeat CT prior to follow up, exercise, cont with O2    ROV 10/29/14 Patient presents today for a followup visit, he is accompanied by his wife. Overall patient states that his breathing is rapidly improve, his wife states that she does hear some audible wheezing with exertion. Patient states he can climb about 2 flights of stairs, but he still winded afterwards. He is currently on Lasix, Advair, Spiriva. He has been compliant with these medications. He did have an overnight pulse oximetry testing done that showed 80 events between 89 and 85% saturation, 2 L of supplemental oxygen was ordered at night for him. He has a history of obstructive sleep apnea wearing his CPAP machine nightly. Given his last admission with pneumonia and bronchitis, he had a repeat CT schedule, which will be performed on 11/02/2014. Plan: - pulmonary function testing and 6 minute walk test prior to follow up - complete out advair (250/50) prescripton, and then start new prescription as stated below - increase Advair to 500/50 (90 day rx) -  1 puff in the AM and PM - gargle and rinse after each use.  - cont with Spiriva as directed.  - albuterol rescue inhaler - 2puff every 3-4 hours as needed for shortness of breath\wheezing\recurrent cough - may use albuterol rescue inhaler, 2 puffs, 15 mins prior to any exertional activities (lawn care, exercise, etc).  - followup CT results on Monday,  11/02/2014 -Continue with supplemental oxygen (2 L) with CPAP at night -CPAP compliance discussed with patient, overall doing well.  Events since last clinic visit: Patient presents today for follow-up visit of his COPD and chronic hypoxic respiratory failure. At his last visit he's noted to be on 2 L of supplemental oxygen continuously. Since his last visit, he told me he is increase his exercise, doing 20 pound weight loss, and noticed that his oxygen saturation has not been below 90% since doing this. He had a 6 minute walk test done, did not show any significant desaturations. He states since increasing exercises demand for oxygen and his oxygen level when he is awake has not been below 88%. He still experiences claudication due to his peripheral vascular disease when he is walking more than 10-15 minutes. Overall he is doing well today, his wife has accompanied him today.  Medication:   Current Outpatient Rx  Name  Route  Sig  Dispense  Refill  . albuterol (PROVENTIL HFA;VENTOLIN HFA) 108 (90 BASE) MCG/ACT inhaler   Inhalation   Inhale 2 puffs into the lungs every 4 (four) hours as needed for wheezing or shortness of breath.   1 Inhaler   2   . amLODipine (NORVASC) 5 MG tablet   Oral   Take 5 mg by mouth daily.          Marland Kitchen apixaban (ELIQUIS) 5 MG TABS tablet   Oral   Take 5 mg by mouth 2 (two) times daily.         Marland Kitchen aspirin EC 81 MG tablet   Oral   Take 81 mg by mouth daily.         . citalopram (CELEXA) 20 MG tablet   Oral   Take 20 mg by mouth at bedtime.       5   . CRANBERRY PO   Oral   Take 820 mg by mouth daily.         . cyanocobalamin 500 MCG tablet   Oral   Take 500 mcg by mouth daily.         . diazepam (VALIUM) 5 MG tablet   Oral   Take 5 mg by mouth every 12 (twelve) hours as needed for anxiety.          . Fluticasone-Salmeterol (ADVAIR DISKUS) 500-50 MCG/DOSE AEPB   Inhalation   Inhale 1 puff into the lungs 2 (two) times daily.   60  each   5   . furosemide (LASIX) 40 MG tablet      Take one tablet daily.  Can take extra tablet in afternoon if increase weight gain of 3 pounds or increased swelling.   30 tablet   60   . insulin lispro (HUMALOG) 100 UNIT/ML injection   Subcutaneous   Inject 22-35 Units into the skin 3 (three) times daily before meals. 22 units before breakfast, 25 units before lunch, and 35 units before supper.         Marland Kitchen LANTUS 100 UNIT/ML injection   Subcutaneous   Inject 0.75 mLs (75 Units total) into the skin at bedtime.  Patient taking differently: Inject 60 Units into the skin at bedtime.    10 mL   5     Dispense as written.   Marland Kitchen lisinopril (PRINIVIL,ZESTRIL) 5 MG tablet   Oral   Take 5 mg by mouth daily.         . metFORMIN (GLUCOPHAGE) 500 MG tablet   Oral   Take 500 mg by mouth 2 (two) times daily with a meal.         . metoprolol (LOPRESSOR) 100 MG tablet   Oral   Take 100 mg by mouth 2 (two) times daily.      0   . OMEGA-3 FATTY ACIDS-VITAMIN E PO   Oral   Take 1 capsule by mouth daily.         Marland Kitchen omeprazole (PRILOSEC) 20 MG capsule   Oral   Take 20 mg by mouth daily.      3   . pravastatin (PRAVACHOL) 20 MG tablet   Oral   Take 20 mg by mouth at bedtime.      3   . tiotropium (SPIRIVA) 18 MCG inhalation capsule   Inhalation   Place 18 mcg into inhaler and inhale daily.            Review of Systems  Constitutional: Negative for fever, chills and weight loss.  HENT: Negative for hearing loss.   Eyes: Negative for blurred vision.  Respiratory: Positive for shortness of breath. Negative for cough, sputum production and wheezing.        Improving SOB, now mostly with exertion  Cardiovascular: Positive for claudication. Negative for chest pain, palpitations and orthopnea.  Gastrointestinal: Negative for heartburn and nausea.  Genitourinary: Negative for dysuria.  Musculoskeletal: Positive for myalgias.       Chronic leg pain with exercise or exertion,  has PVD/PAD  Skin: Negative for rash.  Neurological: Negative for headaches.      Allergies:  Hydralazine; Penicillins; and Codeine  Physical Examination:  VS: BP 128/78 mmHg  Pulse 63  Wt 217 lb (98.431 kg)  SpO2 97%  General Appearance: No distress  HEENT: PERRLA, no ptosis, no other lesions noticed Pulmonary:normal breath sounds., diaphragmatic excursion normal.No wheezing, No rales   Cardiovascular:  Normal S1,S2.  No m/r/g.     Abdomen:Exam: Benign, Soft, non-tender, No masses  Skin:   warm, no rashes, no ecchymosis  Extremities: normal, no cyanosis, clubbing, warm with normal capillary refill.      Rad results: (The following images and results were reviewed by Dr. Stevenson Clinch). CT Chest 10/2014 IMPRESSION: Most of the airspace opacity has cleared compared to the previous study. A small amount of opacity remains in the lingula, stable. This area may represent chronic atelectasis. No new airspace disease. Mild atelectasis is also noted in the left base.  4 mm nodular opacity is noted in the superior segment left lower lobe. Followup of this nodular opacity should be based on Fleischner Society guidelines. If the patient is at high risk for bronchogenic carcinoma, follow-up chest CT at 1 year is recommended. If the patient is at low risk, no follow-up is needed. This recommendation follows the consensus statement: Guidelines for Management of Small Pulmonary Nodules Detected on CT Scans: A Statement from the [employer] as published in Radiology 2005; 237:395-400.    6MWT - 02/10/15 - distance: 250m - lowest saturation: 97% - no complaints during walk, no need for supplemental O2 with exertion  6MWT 09/20/2015 - distance 1116ft - lowest sat  95% - History of peripheral vascular disease and claudication, completed 8.5 minutes of 10 minutes of testing, stopped early due to leg pain  PFTs 02/10/15 postBD FEV1 75% postBD FVC 70% FEV1/FVC 77% RV 147 TLC  126 RV/TLC 110 DLCO - unable toperform Impression - decrease FEV1, preserved FEV1/FVC ratio. Mild/Mod obstruction on inhalers, air trapping/hyperinflation noted.  Significant response to BD, >276ml on FVC and FEV1 post BD   (The following images and results were reviewed by Dr. Stevenson Clinch on 09/22/2015).  CXR 2view 07/09/15 COMPARISON: 07/12/2014  FINDINGS: Post median sternotomy. Enlarged interstitial lung markings bilaterally, particularly on the right side. Heart size is within normal limits. No large pleural effusions. The trachea is midline. Old right rib fractures.  IMPRESSION: Prominent interstitial lung densities bilaterally, right side greater than left. Differential diagnosis includes asymmetric pulmonary edema versus atypical pneumonia.   Assessment and Plan: 75 year old male presents for follow-up visit of COPD, recent admission for CHF exacerbation and acute COPD exacerbation now on 2 L continuous oxygen OSA on CPAP Patient with a known history of OSA on CPAP.  Overnight pulse oximetry showed multiple events (80) occurring between 89 and 85% saturation. Continue with 2 L supplemental oxygen on CPAP.   Plan: -Continue with supplemental oxygen (2 L) with CPAP at night -CPAP compliance discussed with patient, overall doing well.          COPD (chronic obstructive pulmonary disease) (HCC) COPD Overall fairly stable, I believe that his O2 requirement continuously now is more likely due to heart failure/heart failure exacerbation  01/2015 PFTs  - preserved FEV1/FVC ratio, airtrapping and hyperinflation, significant response to BD.  6MWt  - 259m, no desats <88%, no need for supplemental O2.   09/2015 9mwt - 369m, lowest sat 95%, completed 8.5 of 33min, stopped early due to leg pain  Plan: - cont with Advair to 500/50 (90 day rx) - 1 puff in the AM and PM - gargle and rinse after each use.  - cont with Spiriva as directed.  - albuterol rescue inhaler - 2puff  every 3-4 hours as needed for shortness of breath\wheezing\recurrent cough - may use albuterol rescue inhaler, 2 puffs, 15 mins prior to any exertional activities (lawn care, exercise, etc).  - Oxygen via nasal cannula, 2 L at night, with naps/exercise, may take breaks when sedentary (watching TV sitting on couch, etc.) disease.           Chronic respiratory failure (HCC) Had ONO done, requires 2L O2 at night Has a hx of PVD with leg claudication, Vascular Surgery recommended O2 use with exercise to assist with symptoms  Plan - 2L O2 at night, with naps and exercise  Solitary pulmonary nodule Small subcentimeter nodule - LUL 48mm  Plan - repeat with CT Chest with contrast in 01/2016      Updated Medication List Outpatient Encounter Prescriptions as of 09/22/2015  Medication Sig  . albuterol (PROVENTIL HFA;VENTOLIN HFA) 108 (90 BASE) MCG/ACT inhaler Inhale 2 puffs into the lungs every 4 (four) hours as needed for wheezing or shortness of breath.  Marland Kitchen amLODipine (NORVASC) 5 MG tablet Take 5 mg by mouth daily.   Marland Kitchen apixaban (ELIQUIS) 5 MG TABS tablet Take 5 mg by mouth 2 (two) times daily.  Marland Kitchen aspirin EC 81 MG tablet Take 81 mg by mouth daily.  . citalopram (CELEXA) 20 MG tablet Take 20 mg by mouth at bedtime.   Marland Kitchen CRANBERRY PO Take 820 mg by mouth daily.  . cyanocobalamin 500 MCG tablet Take  500 mcg by mouth daily.  . diazepam (VALIUM) 5 MG tablet Take 5 mg by mouth every 12 (twelve) hours as needed for anxiety.   . Fluticasone-Salmeterol (ADVAIR DISKUS) 500-50 MCG/DOSE AEPB Inhale 1 puff into the lungs 2 (two) times daily.  . furosemide (LASIX) 40 MG tablet Take one tablet daily.  Can take extra tablet in afternoon if increase weight gain of 3 pounds or increased swelling.  . insulin lispro (HUMALOG) 100 UNIT/ML injection Inject 22-35 Units into the skin 3 (three) times daily before meals. 22 units before breakfast, 25 units before lunch, and 35 units before supper.  Marland Kitchen LANTUS 100  UNIT/ML injection Inject 0.75 mLs (75 Units total) into the skin at bedtime. (Patient taking differently: Inject 60 Units into the skin at bedtime. )  . lisinopril (PRINIVIL,ZESTRIL) 5 MG tablet Take 5 mg by mouth daily.  . metFORMIN (GLUCOPHAGE) 500 MG tablet Take 500 mg by mouth 2 (two) times daily with a meal.  . metoprolol (LOPRESSOR) 100 MG tablet Take 100 mg by mouth 2 (two) times daily.  . OMEGA-3 FATTY ACIDS-VITAMIN E PO Take 1 capsule by mouth daily.  Marland Kitchen omeprazole (PRILOSEC) 20 MG capsule Take 20 mg by mouth daily.  . pravastatin (PRAVACHOL) 20 MG tablet Take 20 mg by mouth at bedtime.  Marland Kitchen tiotropium (SPIRIVA) 18 MCG inhalation capsule Place 18 mcg into inhaler and inhale daily.   No facility-administered encounter medications on file as of 09/22/2015.    Orders for this visit: No orders of the defined types were placed in this encounter.    Thank  you for the visitation and for allowing  [facility] to assist in the care of your patient. Our recommendations are noted above.  Please contact us if we can be of further service.  Vilinda Boehringer, MD [facility] Office Number: [phone]

## 2015-09-22 NOTE — Assessment & Plan Note (Signed)
Had ONO done, requires 2L O2 at night Has a hx of PVD with leg claudication, Vascular Surgery recommended O2 use with exercise to assist with symptoms  Plan - 2L O2 at night, with naps and exercise

## 2015-09-22 NOTE — Patient Instructions (Addendum)
Follow up with Dr. Stevenson Clinch in:4 months -Healthy diet, exercise, healthy lifestyle -2 L Supplemental oxygen at night, with naps, and exercise, exertion -Continue current inhalers -Allergy avoidance -Avoid any forms of tobacco  -Keep appointment for follow-up CAT scan for left lower lobe pulmonary nodule

## 2015-09-22 NOTE — Assessment & Plan Note (Signed)
Small subcentimeter nodule - LUL 70mm  Plan - repeat with CT Chest with contrast in 01/2016

## 2015-09-22 NOTE — Assessment & Plan Note (Signed)
Patient with a known history of OSA on CPAP.  Overnight pulse oximetry showed multiple events (80) occurring between 89 and 85% saturation. Continue with 2 L supplemental oxygen on CPAP.   Plan: -Continue with supplemental oxygen (2 L) with CPAP at night -CPAP compliance discussed with patient, overall doing well.   

## 2015-09-22 NOTE — Assessment & Plan Note (Signed)
COPD Overall fairly stable, I believe that his O2 requirement continuously now is more likely due to heart failure/heart failure exacerbation  01/2015 PFTs  - preserved FEV1/FVC ratio, airtrapping and hyperinflation, significant response to BD.  6MWt  - 276m, no desats <88%, no need for supplemental O2.   09/2015 69mwt - 353m, lowest sat 95%, completed 8.5 of 43min, stopped early due to leg pain  Plan: - cont with Advair to 500/50 (90 day rx) - 1 puff in the AM and PM - gargle and rinse after each use.  - cont with Spiriva as directed.  - albuterol rescue inhaler - 2puff every 3-4 hours as needed for shortness of breath\wheezing\recurrent cough - may use albuterol rescue inhaler, 2 puffs, 15 mins prior to any exertional activities (lawn care, exercise, etc).  - Oxygen via nasal cannula, 2 L at night, with naps/exercise, may take breaks when sedentary (watching TV sitting on couch, etc.) disease.

## 2015-11-19 ENCOUNTER — Encounter: Payer: Self-pay | Admitting: Family

## 2015-11-19 ENCOUNTER — Ambulatory Visit: Payer: Medicare Other | Attending: Family | Admitting: Family

## 2015-11-19 VITALS — BP 122/45 | HR 66 | Resp 18 | Ht 72.0 in | Wt 214.0 lb

## 2015-11-19 DIAGNOSIS — I10 Essential (primary) hypertension: Secondary | ICD-10-CM

## 2015-11-19 DIAGNOSIS — Z888 Allergy status to other drugs, medicaments and biological substances status: Secondary | ICD-10-CM | POA: Diagnosis not present

## 2015-11-19 DIAGNOSIS — Z800 Family history of malignant neoplasm of digestive organs: Secondary | ICD-10-CM | POA: Insufficient documentation

## 2015-11-19 DIAGNOSIS — J439 Emphysema, unspecified: Secondary | ICD-10-CM

## 2015-11-19 DIAGNOSIS — N189 Chronic kidney disease, unspecified: Secondary | ICD-10-CM | POA: Diagnosis not present

## 2015-11-19 DIAGNOSIS — Z7982 Long term (current) use of aspirin: Secondary | ICD-10-CM | POA: Diagnosis not present

## 2015-11-19 DIAGNOSIS — Z803 Family history of malignant neoplasm of breast: Secondary | ICD-10-CM | POA: Diagnosis not present

## 2015-11-19 DIAGNOSIS — I499 Cardiac arrhythmia, unspecified: Secondary | ICD-10-CM | POA: Insufficient documentation

## 2015-11-19 DIAGNOSIS — Z885 Allergy status to narcotic agent status: Secondary | ICD-10-CM | POA: Insufficient documentation

## 2015-11-19 DIAGNOSIS — Z833 Family history of diabetes mellitus: Secondary | ICD-10-CM | POA: Insufficient documentation

## 2015-11-19 DIAGNOSIS — Z79899 Other long term (current) drug therapy: Secondary | ICD-10-CM | POA: Diagnosis not present

## 2015-11-19 DIAGNOSIS — I130 Hypertensive heart and chronic kidney disease with heart failure and stage 1 through stage 4 chronic kidney disease, or unspecified chronic kidney disease: Secondary | ICD-10-CM | POA: Insufficient documentation

## 2015-11-19 DIAGNOSIS — I4891 Unspecified atrial fibrillation: Secondary | ICD-10-CM | POA: Insufficient documentation

## 2015-11-19 DIAGNOSIS — N183 Chronic kidney disease, stage 3 (moderate): Secondary | ICD-10-CM

## 2015-11-19 DIAGNOSIS — Z880 Allergy status to penicillin: Secondary | ICD-10-CM | POA: Diagnosis not present

## 2015-11-19 DIAGNOSIS — R0602 Shortness of breath: Secondary | ICD-10-CM | POA: Insufficient documentation

## 2015-11-19 DIAGNOSIS — J449 Chronic obstructive pulmonary disease, unspecified: Secondary | ICD-10-CM | POA: Diagnosis not present

## 2015-11-19 DIAGNOSIS — E1122 Type 2 diabetes mellitus with diabetic chronic kidney disease: Secondary | ICD-10-CM | POA: Diagnosis not present

## 2015-11-19 DIAGNOSIS — Z9889 Other specified postprocedural states: Secondary | ICD-10-CM | POA: Insufficient documentation

## 2015-11-19 DIAGNOSIS — Z87891 Personal history of nicotine dependence: Secondary | ICD-10-CM | POA: Insufficient documentation

## 2015-11-19 DIAGNOSIS — Z7984 Long term (current) use of oral hypoglycemic drugs: Secondary | ICD-10-CM | POA: Insufficient documentation

## 2015-11-19 DIAGNOSIS — Z7901 Long term (current) use of anticoagulants: Secondary | ICD-10-CM | POA: Insufficient documentation

## 2015-11-19 DIAGNOSIS — R5383 Other fatigue: Secondary | ICD-10-CM | POA: Diagnosis not present

## 2015-11-19 DIAGNOSIS — G4733 Obstructive sleep apnea (adult) (pediatric): Secondary | ICD-10-CM | POA: Diagnosis not present

## 2015-11-19 DIAGNOSIS — I2510 Atherosclerotic heart disease of native coronary artery without angina pectoris: Secondary | ICD-10-CM | POA: Diagnosis not present

## 2015-11-19 DIAGNOSIS — Z794 Long term (current) use of insulin: Secondary | ICD-10-CM | POA: Insufficient documentation

## 2015-11-19 DIAGNOSIS — I5032 Chronic diastolic (congestive) heart failure: Secondary | ICD-10-CM | POA: Diagnosis present

## 2015-11-19 NOTE — Progress Notes (Signed)
Subjective:    Patient ID: Ernest Mclean, male    DOB: [birth year], 75 y.o.   MRN: [id number]  Congestive Heart Failure Presents for follow-up visit. The disease course has been stable. Associated symptoms include fatigue and shortness of breath. Pertinent negatives include no abdominal pain, chest pain, chest pressure, edema, orthopnea or palpitations. The symptoms have been stable. Past treatments include ACE inhibitors, beta blockers, oxygen and salt and fluid restriction. The treatment provided significant relief. Compliance with prior treatments has been good. His past medical history is significant for arrhythmia, CAD, chronic lung disease, DM and HTN.  Hypertension This is a chronic problem. The current episode started more than 1 year ago. The problem is unchanged. The problem is controlled. Associated symptoms include shortness of breath. Pertinent negatives include no chest pain, headaches, palpitations or peripheral edema. There are no associated agents to hypertension. Risk factors for coronary artery disease include diabetes mellitus, family history and male gender. Past treatments include beta blockers, ACE inhibitors, diuretics and lifestyle changes. The current treatment provides significant improvement. There are no compliance problems.  Hypertensive end-organ damage includes CAD/MI and heart failure.   Past Medical History  Diagnosis Date  . Diabetes ([place])   . Hypertension   . COPD (chronic obstructive pulmonary disease) ([place])   . Congestive heart failure ([place])   . OSA on CPAP   . Atrial fibrillation ([place])   . CKD (chronic kidney disease)   . CAD (coronary artery disease)     Past Surgical History  Procedure Laterality Date  . Heart bypass    . Hernia repair    . Clavicle surgery      Family History  Problem Relation Age of Onset  . Stroke    . Diabetes    . Breast cancer    . Colon cancer      Social History  Substance Use Topics  . Smoking status: Former  Smoker -- 1.00 packs/day for 30 years    Types: Cigarettes  . Smokeless tobacco: Never Used     Comment: quit 12/14/2001  . Alcohol Use: No    Allergies  Allergen Reactions  . Hydralazine Other (See Comments)    tongue swollen and couldn't wake up ---- not positive it was this or a mix of this with something else or high sugar  . Penicillins Other (See Comments)    Passed out (at 75 yrs old) Has patient had a PCN reaction causing immediate rash, facial/tongue/throat swelling, SOB or lightheadedness with hypotension: No Has patient had a PCN reaction causing severe rash involving mucus membranes or skin necrosis: No Has patient had a PCN reaction that required hospitalization No Has patient had a PCN reaction occurring within the last 10 years: No If all of the above answers are "NO", then may proceed with Cephalosporin use.   . Codeine Hives, Rash and Swelling    Prior to Admission medications   Medication Sig Start Date End Date Taking? Authorizing Provider  albuterol (PROVENTIL HFA;VENTOLIN HFA) 108 (90 BASE) MCG/ACT inhaler Inhale 2 puffs into the lungs every 4 (four) hours as needed for wheezing or shortness of breath. 10/29/14  Yes Vishal Mungal, MD  amLODipine (NORVASC) 5 MG tablet Take 5 mg by mouth daily.  08/17/14  Yes Historical Provider, MD  apixaban (ELIQUIS) 5 MG TABS tablet Take 5 mg by mouth 2 (two) times daily. 08/17/14  Yes Historical Provider, MD  aspirin EC 81 MG tablet Take 81 mg by mouth daily. 08/17/14  Yes Historical Provider, MD  citalopram (CELEXA) 20 MG tablet Take 20 mg by mouth at bedtime.  07/30/14  Yes Historical Provider, MD  CRANBERRY PO Take 820 mg by mouth daily.   Yes Historical Provider, MD  cyanocobalamin 500 MCG tablet Take 500 mcg by mouth daily.   Yes Historical Provider, MD  diazepam (VALIUM) 5 MG tablet Take 5 mg by mouth every 12 (twelve) hours as needed for anxiety.  10/24/13  Yes Historical Provider, MD  Fluticasone-Salmeterol (ADVAIR DISKUS) 500-50  MCG/DOSE AEPB Inhale 1 puff into the lungs 2 (two) times daily. 07/19/15  Yes Vishal Mungal, MD  furosemide (LASIX) 40 MG tablet Take one tablet daily.  Can take extra tablet in afternoon if increase weight gain of 3 pounds or increased swelling. 07/11/15  Yes Richard Leslye Peer, MD  insulin lispro (HUMALOG) 100 UNIT/ML injection Inject 22-35 Units into the skin 3 (three) times daily before meals. 22 units before breakfast, 25 units before lunch, and 35 units before supper.   Yes Historical Provider, MD  LANTUS 100 UNIT/ML injection Inject 0.75 mLs (75 Units total) into the skin at bedtime. Patient taking differently: Inject 60 Units into the skin at bedtime.  05/14/15  Yes Gladstone Lighter, MD  lisinopril (PRINIVIL,ZESTRIL) 5 MG tablet Take 5 mg by mouth daily.   Yes Historical Provider, MD  metFORMIN (GLUCOPHAGE) 500 MG tablet Take 500 mg by mouth 2 (two) times daily with a meal.   Yes Historical Provider, MD  metoprolol (LOPRESSOR) 100 MG tablet Take 100 mg by mouth 2 (two) times daily. 06/17/14  Yes Historical Provider, MD  OMEGA-3 FATTY ACIDS-VITAMIN E PO Take 1 capsule by mouth daily.   Yes Historical Provider, MD  omeprazole (PRILOSEC) 20 MG capsule Take 20 mg by mouth daily. 08/07/14  Yes Historical Provider, MD  pravastatin (PRAVACHOL) 20 MG tablet Take 20 mg by mouth at bedtime. 07/31/14  Yes Historical Provider, MD  tiotropium (SPIRIVA) 18 MCG inhalation capsule Place 18 mcg into inhaler and inhale daily. 07/31/14  Yes Historical Provider, MD      Review of Systems  Constitutional: Positive for fatigue. Negative for appetite change.  HENT: Negative for congestion, postnasal drip and sore throat.   Eyes: Negative.   Respiratory: Positive for shortness of breath. Negative for cough and chest tightness.   Cardiovascular: Negative for chest pain, palpitations and leg swelling.  Gastrointestinal: Negative for abdominal pain and abdominal distention.  Endocrine: Negative.   Genitourinary:  Negative.   Musculoskeletal: Negative for myalgias and back pain.  Skin: Negative.   Allergic/Immunologic: Negative.   Neurological: Negative for dizziness, light-headedness and headaches.  Hematological: Negative for adenopathy. Does not bruise/bleed easily.  Psychiatric/Behavioral: Negative for sleep disturbance (sleeping on 2 pillows, oxygen and CPAP) and dysphoric mood. The patient is not nervous/anxious.        Objective:   Physical Exam  Constitutional: He is oriented to person, place, and time. He appears well-developed and well-nourished.  HENT:  Head: Normocephalic and atraumatic.  Eyes: Conjunctivae are normal. Pupils are equal, round, and reactive to light.  Neck: Normal range of motion. Neck supple.  Cardiovascular: Normal rate and regular rhythm.   Pulmonary/Chest: Effort normal. He has no wheezes. He has no rales.  Abdominal: Soft. He exhibits no distension. There is no tenderness.  Musculoskeletal: He exhibits no edema or tenderness.  Neurological: He is alert and oriented to person, place, and time.  Skin: Skin is warm and dry.  Psychiatric: He has a normal mood and affect.  His behavior is normal. Thought content normal.  Nursing note and vitals reviewed.  BP 122/45 mmHg  Pulse 66  Resp 18  Ht 6' (1.829 m)  Wt 214 lb (97.07 kg)  BMI 29.02 kg/m2  SpO2 98%        Assessment & Plan:  1: Chronic heart failure with preserved ejection fraction- Patient presents with fatigue upon moderate exertion (Class II) which resolves quickly after resting for about 5 minutes. He'll start to notice pain in his calf and he'll realize that he needs to sit down and rest.  He'll get short of breath with too much exertion but, again, once he sits down to rest for a few minutes, his breathing improves. He continues to weigh himself and says that his weight has been stable. By our scale, he has lost 1 pound since he was last here March 2017. Reminded to call for an overnight weight gain  of >2 pounds or a weekly weight gain of >5 pounds. He is not adding any salt to his food and his wife is very diligent about reading food labels for sodium content and tries to keep his sodium content closer to 1500mg . Continues to see Dr. Clayborn Bigness.  2: HTN- Blood pressure looks good today. Continue medications at this time. 3: COPD- Is now wearing his oxygen at bedtime and naptime only as well as his CPAP at bedtime. Monitors his oxygen level throughout the day as well. Follows closely with pulmonology and continues to use his inhalers. Rarely notices any wheezing. 4: Diabetes- Patient says that his glucose level was 200 this morning. He thinks that he's only had 1 low glucose reading in the last few weeks. Continues to take his insulin along with glucophage.   Medications were verbally reviewed with the patient and his wife.  Return in 3 months or sooner for any questions/problems before then.

## 2015-11-19 NOTE — Patient Instructions (Signed)
Continue weighing daily and call for an overnight weight gain of > 2 pounds or a weekly weight gain of >5 pounds. 

## 2016-01-12 ENCOUNTER — Other Ambulatory Visit: Payer: Self-pay

## 2016-01-12 MED ORDER — FLUTICASONE-SALMETEROL 500-50 MCG/DOSE IN AEPB: 1.0000 | INHALATION_SPRAY | Freq: Two times a day (BID) | RESPIRATORY_TRACT | 5 refills | Status: DC

## 2016-01-31 ENCOUNTER — Other Ambulatory Visit: Payer: Self-pay | Admitting: *Deleted

## 2016-01-31 DIAGNOSIS — I10 Essential (primary) hypertension: Secondary | ICD-10-CM

## 2016-02-02 ENCOUNTER — Ambulatory Visit (INDEPENDENT_AMBULATORY_CARE_PROVIDER_SITE_OTHER): Payer: Medicare Other | Admitting: *Deleted

## 2016-02-02 DIAGNOSIS — J439 Emphysema, unspecified: Secondary | ICD-10-CM | POA: Diagnosis not present

## 2016-02-02 DIAGNOSIS — R0600 Dyspnea, unspecified: Secondary | ICD-10-CM

## 2016-02-02 NOTE — Progress Notes (Signed)
10 Minute walk performed today per Dr. Stevenson Clinch.

## 2016-02-15 ENCOUNTER — Ambulatory Visit
Admission: RE | Admit: 2016-02-15 | Discharge: 2016-02-15 | Disposition: A | Discharge status: Home / Self Care | Payer: Medicare Other | Source: Ambulatory Visit | Attending: Internal Medicine | Admitting: Internal Medicine

## 2016-02-15 DIAGNOSIS — J439 Emphysema, unspecified: Secondary | ICD-10-CM | POA: Diagnosis not present

## 2016-02-15 DIAGNOSIS — R911 Solitary pulmonary nodule: Secondary | ICD-10-CM | POA: Insufficient documentation

## 2016-02-15 DIAGNOSIS — R918 Other nonspecific abnormal finding of lung field: Secondary | ICD-10-CM | POA: Insufficient documentation

## 2016-02-15 DIAGNOSIS — I700 Atherosclerosis of aorta: Secondary | ICD-10-CM | POA: Diagnosis not present

## 2016-02-15 LAB — POCT I-STAT CREATININE: CREATININE: 1.7 mg/dL — AB (ref 0.61–1.24)

## 2016-02-15 MED ORDER — IOPAMIDOL (ISOVUE-300) INJECTION 61%
60.0000 mL | Freq: Once | INTRAVENOUS | Status: AC | PRN
  Administered 2016-02-15: 60 mL via INTRAVENOUS

## 2016-02-15 MED ORDER — IOPAMIDOL (ISOVUE-300) INJECTION 61%: 75.0000 mL | Freq: Once | INTRAVENOUS | Status: DC | PRN

## 2016-02-17 ENCOUNTER — Ambulatory Visit (INDEPENDENT_AMBULATORY_CARE_PROVIDER_SITE_OTHER): Payer: Medicare Other | Admitting: Internal Medicine

## 2016-02-17 ENCOUNTER — Encounter: Payer: Self-pay | Admitting: Internal Medicine

## 2016-02-17 VITALS — BP 130/68 | HR 63 | Ht 72.0 in | Wt 213.0 lb

## 2016-02-17 DIAGNOSIS — R911 Solitary pulmonary nodule: Secondary | ICD-10-CM

## 2016-02-17 DIAGNOSIS — G4733 Obstructive sleep apnea (adult) (pediatric): Secondary | ICD-10-CM

## 2016-02-17 DIAGNOSIS — J439 Emphysema, unspecified: Secondary | ICD-10-CM

## 2016-02-17 DIAGNOSIS — Z9989 Dependence on other enabling machines and devices: Principal | ICD-10-CM

## 2016-02-17 NOTE — Assessment & Plan Note (Signed)
New nodules after recent infection: New Irregular subpleural density in the left lower lobe on image 74 measures 8.5 x 5 mm.  5 mm sub solid nodule on image number 76 in the left lower lobe is stable when compared to the prior CT scan.  6 mm left lower lobe nodule on image 79 is new.  4.5 mm left lower lobe nodule on image 88 is new.   Plan: Repeat CT chest in 4 months

## 2016-02-17 NOTE — Assessment & Plan Note (Signed)
COPD Overall fairly stable, I believe that his O2 requirement continuously now is more likely due to heart failure/heart failure exacerbation  01/2015 PFTs  - preserved FEV1/FVC ratio, airtrapping and hyperinflation, significant response to BD.  6MWt  - 246m, no desats <88%, no need for supplemental O2.   09/2015 82mwt - 364m, lowest sat 95%, completed 8.5 of 54min, stopped early due to leg pain  02/02/2016: 53mwt - 429m/1378ft, lowest sat 97%, highest HR 80. Again on supplemental O2 for PVD with Claudication (given by Cards and Vasc Surg) and OSA with hypoxemia at night.   Plan: - cont with Advair to 500/50 (90 day rx) - 1 puff in the AM and PM - gargle and rinse after each use.  - cont with Spiriva as directed.  - albuterol rescue inhaler - 2puff every 3-4 hours as needed for shortness of breath\wheezing\recurrent cough - may use albuterol rescue inhaler, 2 puffs, 15 mins prior to any exertional activities (lawn care, exercise, etc).  - Oxygen via nasal cannula, 2 L at night, with naps/exercise, may take breaks when sedentary (watching TV sitting on couch, etc.) disease.

## 2016-02-17 NOTE — Patient Instructions (Addendum)
Follow up with: 4 months - cont with CPAP at night with O2 - cont with current inhalers - cont with incentive spirometer daily - Repeat CT Chest with contrast prior to follow up for pulmonary nodules.

## 2016-02-17 NOTE — Progress Notes (Signed)
[facility] Consultation      MRN# [id number] Ernest Mclean 09/19/1940   CC: Chief Complaint  Patient presents with  . Follow-up    SMW/CT results.       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 Pulmonary nodules on CT, sub cm, mainly due to inflammatory/infectious process.     Events since last clinic visit: Patient presents today for follow-up visit of his COPD and chronic hypoxic respiratory failure. At his last visit he's noted to be on 2 L of supplemental oxygen continuously. Last week and was at the beach with his wife for their 50th anniversary, developed a upper respiratory tract infection, presented to urgent care with complaints of cough, shortness of breath and congestion, was placed on a Z-Pak last Monday completed on Friday with significant improvement. Doing incentive spirometer 20 times daily averaging about 2000cc.  Had a follow up CT Chest, showed some inflammatory nodules consistent with his recent infection.    Medication:   Current Outpatient Prescriptions:  .  albuterol (PROVENTIL HFA;VENTOLIN HFA) 108 (90 BASE) MCG/ACT inhaler, Inhale 2 puffs into the lungs every 4 (four) hours as needed for wheezing or shortness of breath., Disp: 1 Inhaler, Rfl: 2 .  amLODipine (NORVASC) 5 MG tablet, Take 5 mg by mouth daily. , Disp: , Rfl:  .  apixaban (ELIQUIS) 5 MG TABS tablet, Take 5 mg by mouth 2 (two) times daily., Disp: , Rfl:  .  aspirin EC 81 MG tablet, Take 81 mg by mouth daily., Disp: , Rfl:  .  citalopram (CELEXA) 20 MG tablet, Take 20 mg by mouth at bedtime. , Disp: , Rfl: 5 .  CRANBERRY PO, Take 820 mg by mouth daily., Disp: , Rfl:  .  cyanocobalamin 500 MCG tablet, Take 500 mcg by mouth daily., Disp: , Rfl:  .  diazepam (VALIUM) 5 MG tablet, Take 5 mg by mouth every 12  (twelve) hours as needed for anxiety. , Disp: , Rfl:  .  Fluticasone-Salmeterol (ADVAIR DISKUS) 500-50 MCG/DOSE AEPB, Inhale 1 puff into the lungs 2 (two) times daily., Disp: 60 each, Rfl: 5 .  furosemide (LASIX) 40 MG tablet, Take one tablet daily.  Can take extra tablet in afternoon if increase weight gain of 3 pounds or increased swelling., Disp: 30 tablet, Rfl: 60 .  insulin lispro (HUMALOG) 100 UNIT/ML injection, Inject 22-35 Units into the skin 3 (three) times daily before meals. 22 units before breakfast, 25 units before lunch, and 35 units before supper., Disp: , Rfl:  .  LANTUS 100 UNIT/ML injection, Inject 0.75 mLs (75 Units total) into the skin at bedtime. (Patient taking differently: Inject 60 Units into the skin at bedtime. ), Disp: 10 mL, Rfl: 5 .  lisinopril (PRINIVIL,ZESTRIL) 5 MG tablet, Take 5 mg by mouth daily., Disp: , Rfl:  .  metFORMIN (GLUCOPHAGE) 500 MG tablet, Take 500 mg by mouth 2 (two) times daily with a meal., Disp: , Rfl:  .  metoprolol (LOPRESSOR) 100 MG tablet, Take 100 mg by mouth 2 (two) times daily., Disp: , Rfl: 0 .  OMEGA-3 FATTY ACIDS-VITAMIN E PO, Take 1 capsule by mouth daily., Disp: , Rfl:  .  omeprazole (PRILOSEC) 20 MG capsule, Take 20 mg by mouth daily., Disp: , Rfl: 3 .  pravastatin (PRAVACHOL) 20 MG tablet,  Take 20 mg by mouth at bedtime., Disp: , Rfl: 3 .  tiotropium (SPIRIVA) 18 MCG inhalation capsule, Place 18 mcg into inhaler and inhale daily., Disp: , Rfl:     Review of Systems  Constitutional: Negative for chills, fever and weight loss.  HENT: Negative for hearing loss.   Eyes: Negative for blurred vision.  Respiratory: Positive for shortness of breath. Negative for cough, sputum production and wheezing.        Improving SOB, now mostly with exertion  Cardiovascular: Positive for claudication. Negative for chest pain, palpitations and orthopnea.  Gastrointestinal: Negative for heartburn and nausea.  Genitourinary: Negative for dysuria.    Musculoskeletal: Positive for myalgias.       Chronic leg pain with exercise or exertion, has PVD/PAD  Skin: Negative for rash.  Neurological: Negative for headaches.      Allergies:  Hydralazine; Penicillins; and Codeine  Physical Examination:  VS: BP 130/68 (BP Location: Left Arm, Cuff Size: Normal)   Pulse 63   Ht 6' (1.829 m)   Wt 213 lb (96.6 kg)   SpO2 96%   BMI 28.89 kg/m   General Appearance: No distress  HEENT: PERRLA, no ptosis, no other lesions noticed Pulmonary:normal breath sounds., diaphragmatic excursion normal.No wheezing, No rales   Cardiovascular:  Normal S1,S2.  No m/r/g.     Abdomen:Exam: Benign, Soft, non-tender, No masses  Skin:   warm, no rashes, no ecchymosis  Extremities: normal, no cyanosis, clubbing, warm with normal capillary refill.      Rad results: (The following images and results were reviewed by Dr. Stevenson Clinch). CT Chest 10/2014 IMPRESSION: Most of the airspace opacity has cleared compared to the previous study. A small amount of opacity remains in the lingula, stable. This area may represent chronic atelectasis. No new airspace disease. Mild atelectasis is also noted in the left base.  4 mm nodular opacity is noted in the superior segment left lower lobe. Followup of this nodular opacity should be based on Fleischner Society guidelines. If the patient is at high risk for bronchogenic carcinoma, follow-up chest CT at 1 year is recommended. If the patient is at low risk, no follow-up is needed. This recommendation follows the consensus statement: Guidelines for Management of Small Pulmonary Nodules Detected on CT Scans: A Statement from the [employer] as published in Radiology 2005; 237:395-400.    6MWT - 02/10/15 - distance: 260m - lowest saturation: 97% - no complaints during walk, no need for supplemental O2 with exertion  6MWT 09/20/2015 - distance 114ft - lowest sat  95% - History of peripheral vascular disease and  claudication, completed 8.5 minutes of 10 minutes of testing, stopped early due to leg pain  PFTs 02/10/15 postBD FEV1 75% postBD FVC 70% FEV1/FVC 77% RV 147 TLC 126 RV/TLC 110 DLCO - unable toperform Impression - decrease FEV1, preserved FEV1/FVC ratio. Mild/Mod obstruction on inhalers, air trapping/hyperinflation noted.  Significant response to BD, >227ml on FVC and FEV1 post BD   EADIOLOGY: (The following images and results were reviewed by Dr. Stevenson Clinch on 02/17/2016). CT Chest 02/15/16 CLINICAL DATA:  Followup pulmonary nodule.  EXAM: CT CHEST WITH CONTRAST  TECHNIQUE: Multidetector CT imaging of the chest was performed during intravenous contrast administration.  CONTRAST:  10mL ISOVUE-300 IOPAMIDOL (ISOVUE-300) INJECTION 61%  COMPARISON:  Chest CT 11/02/2014  FINDINGS: Chest wall: No chest wall mass, supraclavicular or axillary lymphadenopathy. The thyroid gland is normal.  Cardiovascular: The heart is normal in size. No pericardial effusion. Extensive atherosclerotic calcifications involving the  aorta and branch vessels including the coronary arteries. Evidence of prior bypass surgery. The pulmonary arteries are grossly normal.  Mediastinum/Nodes: Small scattered mediastinal and hilar lymph nodes are stable. No mass or adenopathy. The esophagus is grossly normal.  Lungs/Pleura: New right upper lobe pulmonary nodule on image number 38 measures 5 mm. On image number 38.  Tree-in-bud appearance in the right upper lobe likely due to inflammation or atypical infection such as MAC. Right middle lobe scarring changes are stable.  Ill-defined density in the left lower lobe has the appearance of rounded atelectasis.  New Irregular subpleural density in the left lower lobe on image 74 measures 8.5 x 5 mm.  5 mm sub solid nodule on image number 76 in the left lower lobe is stable when compared to the prior CT scan.  6 mm left lower lobe nodule on image 79  is new.  4.5 mm left lower lobe nodule on image 88 is new.  Stable underlying emphysematous changes.  Upper Abdomen: No significant upper abdominal findings. Advanced atherosclerotic calcifications involving the abdominal aorta and branch vessels.  Musculoskeletal: No significant bony findings.  IMPRESSION: 1. Several new pulmonary nodules since the prior chest CT in June 2016. 2. Patchy area of tree-in-bud appearance in the right upper lobe is likely inflammation or atypical infection. 3. Probable area of rounded atelectasis in the left lower lobe. 4. Stable emphysematous changes. 5. Advanced atherosclerotic calcifications involving the thoracic and abdominal aorta and branch vessels. 6. Scattered mediastinal and hilar lymph nodes. 7. I do not think PET-CT would be helpful as these nodules are too small. Abdominal/pelvic CT may be helpful to exclude malignancy. If this is negative a three-month follow-up chest CT is suggested.    Assessment and Plan: 75 year old male presents for follow-up visit of COPD Solitary pulmonary nodule New nodules after recent infection: New Irregular subpleural density in the left lower lobe on image 74 measures 8.5 x 5 mm.  5 mm sub solid nodule on image number 76 in the left lower lobe is stable when compared to the prior CT scan.  6 mm left lower lobe nodule on image 79 is new.  4.5 mm left lower lobe nodule on image 88 is new.   Plan: Repeat CT chest in 4 months  OSA on CPAP Patient with a known history of OSA on CPAP.  Overnight pulse oximetry showed multiple events (80) occurring between 89 and 85% saturation. Continue with 2 L supplemental oxygen on CPAP.   Plan: -Continue with supplemental oxygen (2 L) with CPAP at night -CPAP compliance discussed with patient, overall doing well.          COPD (chronic obstructive pulmonary disease) (HCC) COPD Overall fairly stable, I believe that his O2 requirement  continuously now is more likely due to heart failure/heart failure exacerbation  01/2015 PFTs  - preserved FEV1/FVC ratio, airtrapping and hyperinflation, significant response to BD.  6MWt  - 245m, no desats <88%, no need for supplemental O2.   09/2015 27mwt - 338m, lowest sat 95%, completed 8.5 of 52min, stopped early due to leg pain  02/02/2016: 24mwt - 424m/1378ft, lowest sat 97%, highest HR 80. Again on supplemental O2 for PVD with Claudication (given by Cards and Vasc Surg) and OSA with hypoxemia at night.   Plan: - cont with Advair to 500/50 (90 day rx) - 1 puff in the AM and PM - gargle and rinse after each use.  - cont with Spiriva as directed.  - albuterol rescue inhaler -  2puff every 3-4 hours as needed for shortness of breath\wheezing\recurrent cough - may use albuterol rescue inhaler, 2 puffs, 15 mins prior to any exertional activities (lawn care, exercise, etc).  - Oxygen via nasal cannula, 2 L at night, with naps/exercise, may take breaks when sedentary (watching TV sitting on couch, etc.) disease.            Updated Medication List Outpatient Encounter Prescriptions as of 02/17/2016  Medication Sig  . albuterol (PROVENTIL HFA;VENTOLIN HFA) 108 (90 BASE) MCG/ACT inhaler Inhale 2 puffs into the lungs every 4 (four) hours as needed for wheezing or shortness of breath.  Marland Kitchen amLODipine (NORVASC) 5 MG tablet Take 5 mg by mouth daily.   Marland Kitchen apixaban (ELIQUIS) 5 MG TABS tablet Take 5 mg by mouth 2 (two) times daily.  Marland Kitchen aspirin EC 81 MG tablet Take 81 mg by mouth daily.  . citalopram (CELEXA) 20 MG tablet Take 20 mg by mouth at bedtime.   Marland Kitchen CRANBERRY PO Take 820 mg by mouth daily.  . cyanocobalamin 500 MCG tablet Take 500 mcg by mouth daily.  . diazepam (VALIUM) 5 MG tablet Take 5 mg by mouth every 12 (twelve) hours as needed for anxiety.   . Fluticasone-Salmeterol (ADVAIR DISKUS) 500-50 MCG/DOSE AEPB Inhale 1 puff into the lungs 2 (two) times daily.  . furosemide (LASIX) 40 MG  tablet Take one tablet daily.  Can take extra tablet in afternoon if increase weight gain of 3 pounds or increased swelling.  . insulin lispro (HUMALOG) 100 UNIT/ML injection Inject 22-35 Units into the skin 3 (three) times daily before meals. 22 units before breakfast, 25 units before lunch, and 35 units before supper.  Marland Kitchen LANTUS 100 UNIT/ML injection Inject 0.75 mLs (75 Units total) into the skin at bedtime. (Patient taking differently: Inject 60 Units into the skin at bedtime. )  . lisinopril (PRINIVIL,ZESTRIL) 5 MG tablet Take 5 mg by mouth daily.  . metFORMIN (GLUCOPHAGE) 500 MG tablet Take 500 mg by mouth 2 (two) times daily with a meal.  . metoprolol (LOPRESSOR) 100 MG tablet Take 100 mg by mouth 2 (two) times daily.  . OMEGA-3 FATTY ACIDS-VITAMIN E PO Take 1 capsule by mouth daily.  Marland Kitchen omeprazole (PRILOSEC) 20 MG capsule Take 20 mg by mouth daily.  . pravastatin (PRAVACHOL) 20 MG tablet Take 20 mg by mouth at bedtime.  Marland Kitchen tiotropium (SPIRIVA) 18 MCG inhalation capsule Place 18 mcg into inhaler and inhale daily.   No facility-administered encounter medications on file as of 02/17/2016.     Orders for this visit: No orders of the defined types were placed in this encounter.   Thank  you for the visitation and for allowing  [facility] to assist in the care of your patient. Our recommendations are noted above.  Please contact us if we can be of further service.  Vilinda Boehringer, MD [facility] Office Number: [phone]

## 2016-02-17 NOTE — Assessment & Plan Note (Signed)
Patient with a known history of OSA on CPAP.  Overnight pulse oximetry showed multiple events (80) occurring between 89 and 85% saturation. Continue with 2 L supplemental oxygen on CPAP.   Plan: -Continue with supplemental oxygen (2 L) with CPAP at night -CPAP compliance discussed with patient, overall doing well.   

## 2016-02-21 ENCOUNTER — Ambulatory Visit: Payer: Medicare Other | Attending: Family | Admitting: Family

## 2016-02-21 ENCOUNTER — Encounter: Payer: Self-pay | Admitting: Family

## 2016-02-21 VITALS — BP 130/65 | HR 59 | Resp 18 | Ht 72.0 in | Wt 216.0 lb

## 2016-02-21 DIAGNOSIS — E1122 Type 2 diabetes mellitus with diabetic chronic kidney disease: Secondary | ICD-10-CM | POA: Insufficient documentation

## 2016-02-21 DIAGNOSIS — Z79899 Other long term (current) drug therapy: Secondary | ICD-10-CM | POA: Insufficient documentation

## 2016-02-21 DIAGNOSIS — N189 Chronic kidney disease, unspecified: Secondary | ICD-10-CM | POA: Diagnosis not present

## 2016-02-21 DIAGNOSIS — I2510 Atherosclerotic heart disease of native coronary artery without angina pectoris: Secondary | ICD-10-CM | POA: Diagnosis not present

## 2016-02-21 DIAGNOSIS — I5032 Chronic diastolic (congestive) heart failure: Secondary | ICD-10-CM | POA: Diagnosis present

## 2016-02-21 DIAGNOSIS — I130 Hypertensive heart and chronic kidney disease with heart failure and stage 1 through stage 4 chronic kidney disease, or unspecified chronic kidney disease: Secondary | ICD-10-CM | POA: Insufficient documentation

## 2016-02-21 DIAGNOSIS — E1151 Type 2 diabetes mellitus with diabetic peripheral angiopathy without gangrene: Secondary | ICD-10-CM | POA: Diagnosis not present

## 2016-02-21 DIAGNOSIS — J439 Emphysema, unspecified: Secondary | ICD-10-CM

## 2016-02-21 DIAGNOSIS — G4733 Obstructive sleep apnea (adult) (pediatric): Secondary | ICD-10-CM | POA: Diagnosis not present

## 2016-02-21 DIAGNOSIS — Z880 Allergy status to penicillin: Secondary | ICD-10-CM | POA: Diagnosis not present

## 2016-02-21 DIAGNOSIS — Z7901 Long term (current) use of anticoagulants: Secondary | ICD-10-CM | POA: Insufficient documentation

## 2016-02-21 DIAGNOSIS — J449 Chronic obstructive pulmonary disease, unspecified: Secondary | ICD-10-CM | POA: Insufficient documentation

## 2016-02-21 DIAGNOSIS — I4891 Unspecified atrial fibrillation: Secondary | ICD-10-CM | POA: Insufficient documentation

## 2016-02-21 DIAGNOSIS — Z87891 Personal history of nicotine dependence: Secondary | ICD-10-CM | POA: Diagnosis not present

## 2016-02-21 DIAGNOSIS — Z794 Long term (current) use of insulin: Secondary | ICD-10-CM | POA: Insufficient documentation

## 2016-02-21 DIAGNOSIS — Z7982 Long term (current) use of aspirin: Secondary | ICD-10-CM | POA: Insufficient documentation

## 2016-02-21 DIAGNOSIS — I10 Essential (primary) hypertension: Secondary | ICD-10-CM

## 2016-02-21 DIAGNOSIS — F419 Anxiety disorder, unspecified: Secondary | ICD-10-CM | POA: Insufficient documentation

## 2016-02-21 DIAGNOSIS — Z9989 Dependence on other enabling machines and devices: Secondary | ICD-10-CM

## 2016-02-21 NOTE — Progress Notes (Signed)
Patient ID: Ernest Mclean, male    DOB: 1941-04-01, 75 y.o.   MRN: [id number]  HPI  Mr Vradenburg is a 75 y/o male with a history of Atrial fibrillation, CAD, CKD, COPD, diabetes, HTN, PVD, PAD, obstructive sleep apnea (on CPAP) and chronic HF with preserved EF.  Last echo was done 07/09/15 which showed an EF of 45-50% with trivial MR. No aortic stenosis. EF is down from June 2016 when it was >55%. Last PFT's done September 2016 with recent 6 minute walk test completed by pulmonology on 02/02/16.   Last hospital admission was 07/09/15 with acute HF exacerbation. Was diuresed with IV Lasix, given antibiotics for possible pneumonia and was qualified for around the clock oxygen use.   He presents today for a follow-up visit with fatigue and shortness of breath with moderate exertion. He says that he can walk about 8 minutes before he has to stop and rest. No edema in ankles/abdomen. Continuing to weigh daily with a stable weight at home. Wearing oxygen at 2L at bedtime and daytime naps if taken. Wears CPAP nightly. Biggest concern today is of anxiety that seems to be getting worse.   Past Medical History:  Diagnosis Date  . Atrial fibrillation ([place])   . CAD (coronary artery disease)   . CKD (chronic kidney disease)   . Congestive heart failure ([place])   . COPD (chronic obstructive pulmonary disease) ([place])   . Diabetes ([place])   . Hypertension   . OSA on CPAP     Past Surgical History:  Procedure Laterality Date  . CLAVICLE SURGERY    . heart bypass    . HERNIA REPAIR      Family History  Problem Relation Age of Onset  . Stroke    . Diabetes    . Breast cancer    . Colon cancer      Social History  Substance Use Topics  . Smoking status: Former Smoker    Packs/day: 1.00    Years: 30.00    Types: Cigarettes  . Smokeless tobacco: Never Used     Comment: quit 12/14/2001  . Alcohol use No    Allergies  Allergen Reactions  . Hydralazine Other (See Comments)    tongue swollen and  couldn't wake up ---- not positive it was this or a mix of this with something else or high sugar  . Penicillins Other (See Comments)    Passed out (at 75 yrs old) Has patient had a PCN reaction causing immediate rash, facial/tongue/throat swelling, SOB or lightheadedness with hypotension: No Has patient had a PCN reaction causing severe rash involving mucus membranes or skin necrosis: No Has patient had a PCN reaction that required hospitalization No Has patient had a PCN reaction occurring within the last 10 years: No If all of the above answers are "NO", then may proceed with Cephalosporin use.   . Codeine Hives, Rash and Swelling    Prior to Admission medications   Medication Sig Start Date End Date Taking? Authorizing Provider  albuterol (PROVENTIL HFA;VENTOLIN HFA) 108 (90 BASE) MCG/ACT inhaler Inhale 2 puffs into the lungs every 4 (four) hours as needed for wheezing or shortness of breath. 10/29/14  Yes Vishal Mungal, MD  amLODipine (NORVASC) 5 MG tablet Take 5 mg by mouth daily.  08/17/14  Yes Historical Provider, MD  apixaban (ELIQUIS) 5 MG TABS tablet Take 5 mg by mouth 2 (two) times daily. 08/17/14  Yes Historical Provider, MD  aspirin EC 81 MG tablet  Take 81 mg by mouth daily. 08/17/14  Yes Historical Provider, MD  citalopram (CELEXA) 20 MG tablet Take 20 mg by mouth at bedtime.  07/30/14  Yes Historical Provider, MD  CRANBERRY PO Take 820 mg by mouth daily.   Yes Historical Provider, MD  cyanocobalamin 500 MCG tablet Take 500 mcg by mouth daily.   Yes Historical Provider, MD  diazepam (VALIUM) 5 MG tablet Take 5 mg by mouth every 12 (twelve) hours as needed for anxiety.  10/24/13  Yes Historical Provider, MD  Fluticasone-Salmeterol (ADVAIR DISKUS) 500-50 MCG/DOSE AEPB Inhale 1 puff into the lungs 2 (two) times daily. 01/12/16  Yes Vishal Mungal, MD  furosemide (LASIX) 40 MG tablet Take one tablet daily.  Can take extra tablet in afternoon if increase weight gain of 3 pounds or increased  swelling. 07/11/15  Yes Richard Leslye Peer, MD  insulin lispro (HUMALOG) 100 UNIT/ML injection Inject 22-35 Units into the skin 3 (three) times daily before meals. 22 units before breakfast, 25 units before lunch, and 35 units before supper.   Yes Historical Provider, MD  LANTUS 100 UNIT/ML injection Inject 0.75 mLs (75 Units total) into the skin at bedtime. Patient taking differently: Inject 60 Units into the skin at bedtime.  05/14/15  Yes Gladstone Lighter, MD  lisinopril (PRINIVIL,ZESTRIL) 5 MG tablet Take 5 mg by mouth daily.   Yes Historical Provider, MD  metFORMIN (GLUCOPHAGE) 500 MG tablet Take 500 mg by mouth 2 (two) times daily with a meal.   Yes Historical Provider, MD  metoprolol (LOPRESSOR) 100 MG tablet Take 100 mg by mouth 2 (two) times daily. 06/17/14  Yes Historical Provider, MD  OMEGA-3 FATTY ACIDS-VITAMIN E PO Take 1 capsule by mouth daily.   Yes Historical Provider, MD  omeprazole (PRILOSEC) 20 MG capsule Take 20 mg by mouth daily. 08/07/14  Yes Historical Provider, MD  pravastatin (PRAVACHOL) 20 MG tablet Take 20 mg by mouth at bedtime. 07/31/14  Yes Historical Provider, MD  tiotropium (SPIRIVA) 18 MCG inhalation capsule Place 18 mcg into inhaler and inhale daily. 07/31/14  Yes Historical Provider, MD     Review of Systems  Constitutional: Positive for fatigue. Negative for appetite change.  HENT: Negative for congestion, postnasal drip and sore throat.   Eyes: Negative.   Respiratory: Positive for shortness of breath. Negative for chest tightness.   Cardiovascular: Negative for chest pain, palpitations and leg swelling.  Gastrointestinal: Negative for abdominal distention and abdominal pain.  Endocrine: Negative.   Genitourinary: Negative.   Musculoskeletal: Negative for back pain and neck pain.  Skin: Negative.   Allergic/Immunologic: Negative.   Neurological: Positive for numbness (neuropathy ). Negative for dizziness and light-headedness.  Hematological: Negative for  adenopathy. Does not bruise/bleed easily.  Psychiatric/Behavioral: Negative for dysphoric mood. The patient is nervous/anxious.    Vitals:   02/21/16 1008  BP: 130/65  Pulse: (!) 59  Resp: 18  SpO2: 100%  Weight: 216 lb (98 kg)  Height: 6' (1.829 m)      Physical Exam  Constitutional: He is oriented to person, place, and time. He appears well-developed and well-nourished.  HENT:  Head: Normocephalic and atraumatic.  Eyes: Conjunctivae are normal. Pupils are equal, round, and reactive to light.  Neck: Normal range of motion. Neck supple.  Cardiovascular: Regular rhythm.  Bradycardia present.   Pulmonary/Chest: Effort normal. He has no wheezes. He has no rales.  Abdominal: Soft. He exhibits no distension. There is no tenderness.  Musculoskeletal: He exhibits no edema or tenderness.  Neurological:  He is alert and oriented to person, place, and time.  Skin: Skin is warm and dry.  Psychiatric: His behavior is normal. Thought content normal. His mood appears anxious. He does not exhibit a depressed mood.  Vitals reviewed.    Assessment & Plan:  1: Chronic heart failure with preserved ejection fraction- - NYHA Class 2 - Euvolemic today - Continue daily weights with taking additional furosemide for overnight weight gain of >2 pounds/weekly weight gain of >5 pounds - Continue daily walking - Last saw cardiology 10/13/15 with note to be seen in one year  2: HTN- -  BP looks great today - Sees PCP on 04/05/16  3: Obstructive sleep apnea- - Wearing CPAP on a nightly basis - Wears oxygen at 2L QHS  4: COPD- - Saw pulmonologist on 02/17/16 - Using inhalers & oxygen - Recent chest CT with new pulmonary nodules since June 2016. Repeating CT in about 4 months if renal function is stable  5: Anxiety- - Patient & wife both endorse worsening anxiety over the last few years and especially over the last few months - Worse in crowds with lots of talking/noise and at funerals - Has  diazepam that he can take but doesn't take because he says that he doesn't want to get addicted - Encouraged him to take it prior to an event that he knows is going to make him anxious to see if that helps  Return here in 3 months or sooner for any questions/problems before then.

## 2016-02-21 NOTE — Patient Instructions (Signed)
Continue weighing daily and call for an overnight weight gain of > 2 pounds or a weekly weight gain of >5 pounds. 

## 2016-02-22 DIAGNOSIS — F419 Anxiety disorder, unspecified: Secondary | ICD-10-CM | POA: Insufficient documentation

## 2016-04-26 ENCOUNTER — Telehealth: Payer: Self-pay | Admitting: Internal Medicine

## 2016-04-26 NOTE — Telephone Encounter (Signed)
Pt wife calling to schedule pt 4 mo rov per 02-17-16 OV note, as she was told to make a note to call us this week to get Mr. Mazzanti scheduled.  Pt wife asked if VM would be here in February, I informed pt that he would not be, however we have three other doctors. Pt wife states she has a friend that sees one of our doctors here and will call us back to scheduled. I also made pt aware as far as the CT, Suanne Marker will be contact pt in the next month or so to get that scheduled as well. Will await call back.

## 2016-04-26 NOTE — Telephone Encounter (Signed)
Pt wife is calling, states pt needs to schedule a scan of his lungs and a follow up. Please call.

## 2016-05-18 ENCOUNTER — Telehealth: Payer: Self-pay | Admitting: Internal Medicine

## 2016-05-18 NOTE — Telephone Encounter (Signed)
Patient hasn't heard when his CT w/ contrast is scheduled per Dr. Stevenson Clinch and has a follow up appt with Dr. [name] 06/01/16. Please call patient stat.

## 2016-05-18 NOTE — Telephone Encounter (Signed)
Pt has an appt with Dr. [name] in [place] office on 1/11.  Pt will need to have a CT scan at [facility] prior to this appt.  Will forward to [facility] to follow up on and schedule CT.  thanks

## 2016-05-19 ENCOUNTER — Other Ambulatory Visit: Payer: Self-pay

## 2016-05-19 DIAGNOSIS — R911 Solitary pulmonary nodule: Secondary | ICD-10-CM

## 2016-05-19 DIAGNOSIS — J439 Emphysema, unspecified: Secondary | ICD-10-CM

## 2016-05-19 NOTE — Telephone Encounter (Signed)
CT has been scheduled for 1/8 at 10:30 by Rodena Piety.  She has spoken to pt & given him appt info.

## 2016-05-24 ENCOUNTER — Other Ambulatory Visit
Admission: RE | Admit: 2016-05-24 | Discharge: 2016-05-24 | Disposition: A | Discharge status: Home / Self Care | Payer: Medicare Other | Source: Ambulatory Visit | Attending: Internal Medicine | Admitting: Internal Medicine

## 2016-05-24 DIAGNOSIS — J439 Emphysema, unspecified: Secondary | ICD-10-CM

## 2016-05-24 DIAGNOSIS — R911 Solitary pulmonary nodule: Secondary | ICD-10-CM | POA: Diagnosis not present

## 2016-05-24 LAB — BASIC METABOLIC PANEL
ANION GAP: 6 (ref 5–15)
BUN: 42 mg/dL — ABNORMAL HIGH (ref 6–20)
CO2: 30 mmol/L (ref 22–32)
Calcium: 9.2 mg/dL (ref 8.9–10.3)
Chloride: 103 mmol/L (ref 101–111)
Creatinine, Ser: 1.76 mg/dL — ABNORMAL HIGH (ref 0.61–1.24)
GFR calc non Af Amer: 36 mL/min — ABNORMAL LOW (ref >60)
GFR, EST AFRICAN AMERICAN: 42 mL/min — AB (ref >60)
Glucose, Bld: 64 mg/dL — ABNORMAL LOW (ref 65–99)
POTASSIUM: 4.5 mmol/L (ref 3.5–5.1)
SODIUM: 139 mmol/L (ref 135–145)

## 2016-05-29 ENCOUNTER — Ambulatory Visit
Admission: RE | Admit: 2016-05-29 | Discharge: 2016-05-29 | Disposition: A | Discharge status: Home / Self Care | Payer: Medicare Other | Source: Ambulatory Visit | Attending: Internal Medicine | Admitting: Internal Medicine

## 2016-05-29 ENCOUNTER — Telehealth: Payer: Self-pay | Admitting: *Deleted

## 2016-05-29 DIAGNOSIS — K8020 Calculus of gallbladder without cholecystitis without obstruction: Secondary | ICD-10-CM | POA: Diagnosis not present

## 2016-05-29 DIAGNOSIS — R918 Other nonspecific abnormal finding of lung field: Secondary | ICD-10-CM | POA: Diagnosis not present

## 2016-05-29 DIAGNOSIS — R911 Solitary pulmonary nodule: Secondary | ICD-10-CM

## 2016-05-29 DIAGNOSIS — I2510 Atherosclerotic heart disease of native coronary artery without angina pectoris: Secondary | ICD-10-CM | POA: Diagnosis not present

## 2016-05-29 DIAGNOSIS — I700 Atherosclerosis of aorta: Secondary | ICD-10-CM | POA: Diagnosis not present

## 2016-05-29 MED ORDER — IOPAMIDOL (ISOVUE-300) INJECTION 61%
60.0000 mL | Freq: Once | INTRAVENOUS | Status: AC | PRN
  Administered 2016-05-29: 60 mL via INTRAVENOUS

## 2016-05-29 NOTE — Telephone Encounter (Signed)
Opened in error

## 2016-05-30 ENCOUNTER — Ambulatory Visit: Payer: Medicare Other | Attending: Family | Admitting: Family

## 2016-05-30 VITALS — BP 122/54 | HR 65 | Resp 18 | Ht 72.0 in | Wt 215.0 lb

## 2016-05-30 DIAGNOSIS — G4733 Obstructive sleep apnea (adult) (pediatric): Secondary | ICD-10-CM | POA: Insufficient documentation

## 2016-05-30 DIAGNOSIS — I130 Hypertensive heart and chronic kidney disease with heart failure and stage 1 through stage 4 chronic kidney disease, or unspecified chronic kidney disease: Secondary | ICD-10-CM | POA: Diagnosis not present

## 2016-05-30 DIAGNOSIS — Z7951 Long term (current) use of inhaled steroids: Secondary | ICD-10-CM | POA: Diagnosis not present

## 2016-05-30 DIAGNOSIS — Z880 Allergy status to penicillin: Secondary | ICD-10-CM | POA: Insufficient documentation

## 2016-05-30 DIAGNOSIS — J439 Emphysema, unspecified: Secondary | ICD-10-CM

## 2016-05-30 DIAGNOSIS — Z87891 Personal history of nicotine dependence: Secondary | ICD-10-CM | POA: Insufficient documentation

## 2016-05-30 DIAGNOSIS — I2510 Atherosclerotic heart disease of native coronary artery without angina pectoris: Secondary | ICD-10-CM | POA: Insufficient documentation

## 2016-05-30 DIAGNOSIS — E1151 Type 2 diabetes mellitus with diabetic peripheral angiopathy without gangrene: Secondary | ICD-10-CM | POA: Diagnosis not present

## 2016-05-30 DIAGNOSIS — I5032 Chronic diastolic (congestive) heart failure: Secondary | ICD-10-CM | POA: Diagnosis present

## 2016-05-30 DIAGNOSIS — Z7982 Long term (current) use of aspirin: Secondary | ICD-10-CM | POA: Insufficient documentation

## 2016-05-30 DIAGNOSIS — I4891 Unspecified atrial fibrillation: Secondary | ICD-10-CM | POA: Insufficient documentation

## 2016-05-30 DIAGNOSIS — F419 Anxiety disorder, unspecified: Secondary | ICD-10-CM | POA: Insufficient documentation

## 2016-05-30 DIAGNOSIS — J449 Chronic obstructive pulmonary disease, unspecified: Secondary | ICD-10-CM | POA: Insufficient documentation

## 2016-05-30 DIAGNOSIS — N189 Chronic kidney disease, unspecified: Secondary | ICD-10-CM | POA: Diagnosis not present

## 2016-05-30 DIAGNOSIS — Z888 Allergy status to other drugs, medicaments and biological substances status: Secondary | ICD-10-CM | POA: Diagnosis not present

## 2016-05-30 DIAGNOSIS — E1122 Type 2 diabetes mellitus with diabetic chronic kidney disease: Secondary | ICD-10-CM | POA: Insufficient documentation

## 2016-05-30 DIAGNOSIS — Z794 Long term (current) use of insulin: Secondary | ICD-10-CM | POA: Insufficient documentation

## 2016-05-30 DIAGNOSIS — Z79899 Other long term (current) drug therapy: Secondary | ICD-10-CM | POA: Insufficient documentation

## 2016-05-30 DIAGNOSIS — M79661 Pain in right lower leg: Secondary | ICD-10-CM | POA: Diagnosis not present

## 2016-05-30 DIAGNOSIS — Z7901 Long term (current) use of anticoagulants: Secondary | ICD-10-CM | POA: Diagnosis not present

## 2016-05-30 DIAGNOSIS — N183 Chronic kidney disease, stage 3 unspecified: Secondary | ICD-10-CM

## 2016-05-30 DIAGNOSIS — Z9989 Dependence on other enabling machines and devices: Secondary | ICD-10-CM

## 2016-05-30 DIAGNOSIS — I10 Essential (primary) hypertension: Secondary | ICD-10-CM

## 2016-05-30 NOTE — Patient Instructions (Signed)
Continue weighing daily and call for an overnight weight gain of > 2 pounds or a weekly weight gain of >5 pounds. 

## 2016-05-30 NOTE — Progress Notes (Signed)
Patient ID: Ernest Mclean, male    DOB: [birth year], 76 y.o.   MRN: [id number]  HPI  Ernest Mclean is a 76 y/o male with a history of Atrial fibrillation, CAD, CKD, COPD, diabetes, HTN, PVD, PAD, obstructive sleep apnea (on CPAP), remote tobacco use and chronic HF with preserved EF.  Last echo was done 07/09/15 which showed an EF of 45-50% with trivial Ernest. No aortic stenosis. EF is down from June 2016 when it was >55%. Last PFT's done September 2016 with recent 6 minute walk test completed by pulmonology on 02/02/16.   Last hospital admission was 07/09/15 with acute HF exacerbation. Was diuresed with IV Lasix, given antibiotics for possible pneumonia and was qualified for around the clock oxygen use.   He presents today for a follow-up visit with fatigue with moderate exertion. He says that he can walk about 8 minutes before he has to stop and rest due to fatigue but also pain in his right calf. No edema in ankles/abdomen. Denies any shortness of breath Continuing to weigh daily with a stable weight at home. Wearing oxygen at 2L at bedtime and daytime naps if taken. Wears CPAP nightly. Continues to have issues with anxiety in crowds, large family gatherings etc  Past Medical History:  Diagnosis Date  . Atrial fibrillation ([place])   . CAD (coronary artery disease)   . CKD (chronic kidney disease)   . Congestive heart failure ([place])   . COPD (chronic obstructive pulmonary disease) ([place])   . Diabetes ([place])   . Hypertension   . OSA on CPAP    Past Surgical History:  Procedure Laterality Date  . CLAVICLE SURGERY    . heart bypass    . HERNIA REPAIR     Family History  Problem Relation Age of Onset  . Stroke    . Diabetes    . Breast cancer    . Colon cancer     Social History  Substance Use Topics  . Smoking status: Former Smoker    Packs/day: 1.00    Years: 30.00    Types: Cigarettes  . Smokeless tobacco: Never Used     Comment: quit 12/14/2001  . Alcohol use No   Allergies  Allergen  Reactions  . Hydralazine Other (See Comments)    tongue swollen and couldn't wake up ---- not positive it was this or a mix of this with something else or high sugar  . Penicillins Other (See Comments)    Passed out (at 76 yrs old) Has patient had a PCN reaction causing immediate rash, facial/tongue/throat swelling, SOB or lightheadedness with hypotension: No Has patient had a PCN reaction causing severe rash involving mucus membranes or skin necrosis: No Has patient had a PCN reaction that required hospitalization No Has patient had a PCN reaction occurring within the last 10 years: No If all of the above answers are "NO", then may proceed with Cephalosporin use.   . Codeine Hives, Rash and Swelling   Prior to Admission medications   Medication Sig Start Date End Date Taking? Authorizing Provider  albuterol (PROVENTIL HFA;VENTOLIN HFA) 108 (90 BASE) MCG/ACT inhaler Inhale 2 puffs into the lungs every 4 (four) hours as needed for wheezing or shortness of breath. 10/29/14  Yes Vishal Mungal, MD  amLODipine (NORVASC) 5 MG tablet Take 5 mg by mouth daily.  08/17/14  Yes Historical Provider, MD  apixaban (ELIQUIS) 5 MG TABS tablet Take 5 mg by mouth 2 (two) times daily. 08/17/14  Yes Historical  Provider, MD  aspirin EC 81 MG tablet Take 81 mg by mouth daily. 08/17/14  Yes Historical Provider, MD  citalopram (CELEXA) 20 MG tablet Take 20 mg by mouth at bedtime.  07/30/14  Yes Historical Provider, MD  CRANBERRY PO Take 820 mg by mouth daily.   Yes Historical Provider, MD  cyanocobalamin 500 MCG tablet Take 500 mcg by mouth daily.   Yes Historical Provider, MD  desonide (DESOWEN) 0.05 % cream Apply 1 application topically 2 (two) times daily. 05/10/16  Yes Historical Provider, MD  diazepam (VALIUM) 5 MG tablet Take 5 mg by mouth every 12 (twelve) hours as needed for anxiety.  10/24/13  Yes Historical Provider, MD  Fluocinolone Acetonide 0.01 % OIL Place 2 drops into both ears 2 (two) times daily. 05/10/16   Yes Historical Provider, MD  Fluticasone-Salmeterol (ADVAIR DISKUS) 500-50 MCG/DOSE AEPB Inhale 1 puff into the lungs 2 (two) times daily. 01/12/16  Yes Vishal Mungal, MD  furosemide (LASIX) 40 MG tablet Take one tablet daily.  Can take extra tablet in afternoon if increase weight gain of 3 pounds or increased swelling. 07/11/15  Yes Richard Leslye Peer, MD  insulin lispro (HUMALOG) 100 UNIT/ML injection Inject 22-35 Units into the skin 3 (three) times daily before meals. 22 units before breakfast, 25 units before lunch, and 35 units before supper.   Yes Historical Provider, MD  ketoconazole (NIZORAL) 2 % shampoo Apply 1 application topically 2 (two) times a week. 05/11/16  Yes Historical Provider, MD  LANTUS 100 UNIT/ML injection Inject 0.75 mLs (75 Units total) into the skin at bedtime. Patient taking differently: Inject 60 Units into the skin at bedtime.  05/14/15  Yes Gladstone Lighter, MD  lisinopril (PRINIVIL,ZESTRIL) 5 MG tablet Take 5 mg by mouth daily.   Yes Historical Provider, MD  metFORMIN (GLUCOPHAGE) 500 MG tablet Take 500 mg by mouth 2 (two) times daily with a meal.   Yes Historical Provider, MD  metoprolol (LOPRESSOR) 100 MG tablet Take 100 mg by mouth 2 (two) times daily. 06/17/14  Yes Historical Provider, MD  OMEGA-3 FATTY ACIDS-VITAMIN E PO Take 1 capsule by mouth daily.   Yes Historical Provider, MD  omeprazole (PRILOSEC) 20 MG capsule Take 20 mg by mouth daily. 08/07/14  Yes Historical Provider, MD  pravastatin (PRAVACHOL) 20 MG tablet Take 20 mg by mouth at bedtime. 07/31/14  Yes Historical Provider, MD  tiotropium (SPIRIVA) 18 MCG inhalation capsule Place 18 mcg into inhaler and inhale daily. 07/31/14  Yes Historical Provider, MD    Review of Systems  Constitutional: Positive for fatigue. Negative for appetite change.  HENT: Negative for congestion, postnasal drip and sore throat.   Eyes: Negative.   Respiratory: Negative for cough, chest tightness and shortness of breath.    Cardiovascular: Negative for chest pain, palpitations and leg swelling.  Gastrointestinal: Negative for abdominal distention and abdominal pain.  Endocrine: Negative.   Genitourinary: Negative.   Musculoskeletal: Positive for arthralgias (right calf after exertion). Negative for back pain.  Skin: Negative.   Allergic/Immunologic: Negative.   Neurological: Positive for numbness (tingling in lower legs). Negative for dizziness and light-headedness.  Hematological: Negative for adenopathy. Bruises/bleeds easily (above right eyelid).  Psychiatric/Behavioral: Negative for dysphoric mood, sleep disturbance (wearing CPAP and oxygen) and suicidal ideas. The patient is nervous/anxious.    Vitals:   05/30/16 1002  BP: (!) 122/54  Pulse: 65  Resp: 18  SpO2: 99%  Weight: 215 lb (97.5 kg)  Height: 6' (1.829 m)   Wt Readings from  Last 3 Encounters:  05/30/16 215 lb (97.5 kg)  02/21/16 216 lb (98 kg)  02/17/16 213 lb (96.6 kg)   Lab Results  Component Value Date   CREATININE 1.76 (H) 05/24/2016   CREATININE 1.70 (H) 02/15/2016   CREATININE 1.88 (H) 07/11/2015   Physical Exam  Constitutional: He is oriented to person, place, and time. He appears well-developed and well-nourished.  HENT:  Head: Normocephalic and atraumatic.  Eyes: Conjunctivae are normal. Pupils are equal, round, and reactive to light.  Neck: Normal range of motion. Neck supple. No JVD present.  Cardiovascular: Normal rate and regular rhythm.   Pulmonary/Chest: Effort normal. He has no wheezes. He has no rales.  Abdominal: Soft. He exhibits no distension. There is no tenderness.  Musculoskeletal: He exhibits no edema or tenderness.  Neurological: He is alert and oriented to person, place, and time.  Skin: Skin is warm and dry.  Psychiatric: He has a normal mood and affect. His behavior is normal. Thought content normal.  Nursing note and vitals reviewed.     Assessment & Plan:  1: Chronic heart failure with  preserved ejection fraction- - NYHA Class 2 - Euvolemic today - Continue daily weights with taking additional furosemide for overnight weight gain of >2 pounds/weekly weight gain of >5 pounds - Continue daily walking with rest as needed for calf pain - to discuss with cardiology about possibly wearing oxygen during exertion to help with his leg pain due to claudication - Last saw cardiology 10/13/15 with note to be seen in one year  2: HTN- -  BP looks great today - Saw PCP (Ernest Mclean) on 05/29/16. Returns July 2018.  3: Obstructive sleep apnea- - Wearing CPAP on a nightly basis - Wears oxygen at 2L [facility]  4: Diabetes- - glucose this morning was 99 - continues to have some fluctuations especially if his sodium intake is >2000mg  daily  5: COPD- - Saw pulmonologist on 02/17/16 - Using inhalers & oxygen - Recent chest CT with new pulmonary nodules since June 2016. Had repeat CT done yesterday (05/29/16)  6: Anxiety- - Patient & wife both endorse worsening anxiety over the last few years and especially over the last few months - Worse in crowds with lots of talking/noise and at funerals - remains hesitant about taking diazepam because he doesn't want to get addicted. Discussed how stress can affect the heart and that he could try breaking it in half to see if that's enough to help decrease his anxiety  Return in 3 months or sooner for any questions/problems before then.

## 2016-05-31 ENCOUNTER — Encounter: Payer: Self-pay | Admitting: Family

## 2016-06-01 ENCOUNTER — Encounter: Payer: Self-pay | Admitting: Internal Medicine

## 2016-06-01 ENCOUNTER — Ambulatory Visit (INDEPENDENT_AMBULATORY_CARE_PROVIDER_SITE_OTHER): Payer: Medicare Other | Admitting: Internal Medicine

## 2016-06-01 VITALS — BP 130/84 | HR 60 | Wt 219.0 lb

## 2016-06-01 DIAGNOSIS — J9611 Chronic respiratory failure with hypoxia: Secondary | ICD-10-CM | POA: Diagnosis not present

## 2016-06-01 NOTE — Progress Notes (Signed)
[facility] Consultation      MRN# [id number] ZALAN HENNINGSEN 1940/08/14   CC: Chief Complaint  Patient presents with  . Follow-up    CT results: denies any SOB, has occasional cough:       Brief History: 76 yo male with COPD, PVD with peripheral claudication, OSA  worst after PNA in 07/2014, now following for COPD and Chronic Hypoxic respiratory failure on 2L o2 (maily for PVD and at night for OSA). Pulmonary nodules on CT, sub cm, mainly due to inflammatory/infectious process.     Events since last clinic visit: Patient presents today for follow-up visit of his COPD and chronic hypoxic respiratory failure/OSA/nodules At his last visit he's noted to be on 2 L of supplemental oxygen at night  Doing incentive spirometer 20 times daily averaging about 2000cc.  Has follow up CT Chest-, showed some inflammatory nodules consistent with his recent infection and has completely resolved  No acute issues at this time No signs of infection at this time Doing well overall.    Medication:   Current Outpatient Prescriptions:  .  albuterol (PROVENTIL HFA;VENTOLIN HFA) 108 (90 BASE) MCG/ACT inhaler, Inhale 2 puffs into the lungs every 4 (four) hours as needed for wheezing or shortness of breath., Disp: 1 Inhaler, Rfl: 2 .  amLODipine (NORVASC) 5 MG tablet, Take 5 mg by mouth daily. , Disp: , Rfl:  .  apixaban (ELIQUIS) 5 MG TABS tablet, Take 5 mg by mouth 2 (two) times daily., Disp: , Rfl:  .  aspirin EC 81 MG tablet, Take 81 mg by mouth daily., Disp: , Rfl:  .  citalopram (CELEXA) 20 MG tablet, Take 20 mg by mouth at bedtime. , Disp: , Rfl: 5 .  CRANBERRY PO, Take 820 mg by mouth daily., Disp: , Rfl:  .  cyanocobalamin 500 MCG tablet, Take 500 mcg by mouth daily., Disp: , Rfl:  .  desonide (DESOWEN) 0.05 % cream, Apply 1 application topically 2 (two) times daily., Disp: , Rfl:  .  diazepam (VALIUM) 5 MG tablet, Take 5 mg by mouth every 12 (twelve) hours as needed for  anxiety. , Disp: , Rfl:  .  Fluocinolone Acetonide 0.01 % OIL, Place 2 drops into both ears 2 (two) times daily., Disp: , Rfl:  .  Fluticasone-Salmeterol (ADVAIR DISKUS) 500-50 MCG/DOSE AEPB, Inhale 1 puff into the lungs 2 (two) times daily., Disp: 60 each, Rfl: 5 .  furosemide (LASIX) 40 MG tablet, Take one tablet daily.  Can take extra tablet in afternoon if increase weight gain of 3 pounds or increased swelling., Disp: 30 tablet, Rfl: 60 .  insulin lispro (HUMALOG) 100 UNIT/ML injection, Inject 22-35 Units into the skin 3 (three) times daily before meals. 22 units before breakfast, 25 units before lunch, and 35 units before supper., Disp: , Rfl:  .  ketoconazole (NIZORAL) 2 % shampoo, Apply 1 application topically 2 (two) times a week., Disp: , Rfl:  .  LANTUS 100 UNIT/ML injection, Inject 0.75 mLs (75 Units total) into the skin at bedtime. (Patient taking differently: Inject 60 Units into the skin at bedtime. ), Disp: 10 mL, Rfl: 5 .  lisinopril (PRINIVIL,ZESTRIL) 5 MG tablet, Take 5 mg by mouth daily., Disp: , Rfl:  .  metFORMIN (GLUCOPHAGE) 500 MG tablet, Take 500 mg by mouth 2 (two) times daily with a meal., Disp: , Rfl:  .  metoprolol (LOPRESSOR) 100 MG tablet, Take 100 mg by mouth 2 (two) times daily., Disp: , Rfl:  0 .  OMEGA-3 FATTY ACIDS-VITAMIN E PO, Take 1 capsule by mouth daily., Disp: , Rfl:  .  omeprazole (PRILOSEC) 20 MG capsule, Take 20 mg by mouth daily., Disp: , Rfl: 3 .  pravastatin (PRAVACHOL) 20 MG tablet, Take 20 mg by mouth at bedtime., Disp: , Rfl: 3 .  tiotropium (SPIRIVA) 18 MCG inhalation capsule, Place 18 mcg into inhaler and inhale daily., Disp: , Rfl:     Review of Systems  Constitutional: Negative for chills, fever and weight loss.  HENT: Negative for hearing loss.   Eyes: Negative for blurred vision.  Respiratory: Positive for shortness of breath. Negative for cough, sputum production and wheezing.        Improving SOB, now mostly with exertion  Cardiovascular:  Positive for claudication. Negative for chest pain, palpitations and orthopnea.  Gastrointestinal: Negative for heartburn and nausea.  Genitourinary: Negative for dysuria.  Musculoskeletal: Positive for myalgias.       Chronic leg pain with exercise or exertion, has PVD/PAD  Skin: Negative for rash.  Neurological: Negative for headaches.      Allergies:  Hydralazine; Penicillins; and Codeine  Physical Examination:  VS: BP 130/84 (BP Location: Left Arm, Cuff Size: Normal)   Pulse 60   Wt 219 lb (99.3 kg)   SpO2 98%   BMI 29.70 kg/m   General Appearance: No distress  HEENT: PERRLA, no ptosis, no other lesions noticed Pulmonary:normal breath sounds., diaphragmatic excursion normal.No wheezing, No rales   Cardiovascular:  Normal S1,S2.  No m/r/g.     Abdomen:Exam: Benign, Soft, non-tender, No masses  Skin:   warm, no rashes, no ecchymosis  Extremities: normal, no cyanosis, clubbing, warm with normal capillary refill.       6MWT - 02/10/15 - distance: 258m - lowest saturation: 97% - no complaints during walk, no need for supplemental O2 with exertion  6MWT 09/20/2015 - distance 1167ft - lowest sat  95% - History of peripheral vascular disease and claudication, completed 8.5 minutes of 10 minutes of testing, stopped early due to leg pain  PFTs 02/10/15 postBD FEV1 75% postBD FVC 70% FEV1/FVC 77% RV 147 TLC 126 RV/TLC 110 DLCO - unable toperform Impression - decrease FEV1, preserved FEV1/FVC ratio. Mild/Mod obstruction on inhalers, air trapping/hyperinflation noted.  Significant response to BD, >267ml on FVC and FEV1 post BD   EADIOLOGY: (The following images and results were reviewed by Dr. Stevenson Clinch on 06/01/2016). CT Chest 02/15/16 IMPRESSION:CT chest 01/2016 1. Several new pulmonary nodules since the prior chest CT in June 2016. 2. Patchy area of tree-in-bud appearance in the right upper lobe is likely inflammation or atypical infection. 3. Probable area of rounded  atelectasis in the left lower lobe. 4. Stable emphysematous changes. 5. Advanced atherosclerotic calcifications involving the thoracic and abdominal aorta and branch vessels. 6. Scattered mediastinal and hilar lymph nodes. 7. I do not think PET-CT would be helpful as these nodules are too small. Abdominal/pelvic CT may be helpful to exclude malignancy. If this is negative a three-month follow-up chest CT is suggested.  Repeat CT chest 05/2016 IMPRESSION: 1. Scattered small pulmonary nodules, regarded as new on the prior exam, are smaller or resolved on the current exam. 2. Resolved subpleural consolidation/ground-glass in the left lower lobe with residual scarring. 3. Additional pulmonary nodules are unchanged from 07/15/2014 and considered benign. 4. Aortic atherosclerosis (ICD10-170.0). Coronary artery calcification. 5. Cholelithiasis.   Assessment and Plan: 76 year old male presents for follow-up visit of Mod COPD gold stage C with CKD,CHF with OSA and  chronic hypoxic resp failure,   Solitary pulmonary nodule previous nodules after recent infection have resolved   OSA on CPAP Patient with a known history of OSA on CPAP.10 cm h20  Overnight pulse oximetry showed multiple events (80) occurring between 89 and 85% saturation. Continue with 2 L supplemental oxygen on CPAP.   Plan: -Continue with supplemental oxygen (2 L) with CPAP at night -CPAP compliance discussed with patient, overall doing well.    COPD (chronic obstructive pulmonary disease) (HCC) Overall fairly stable,   01/2015 PFTs  - preserved FEV1/FVC ratio, airtrapping and hyperinflation, significant response to BD.  6MWt  - 254m, no desats <88%, no need for supplemental O2.   09/2015 44mwt - 374m, lowest sat 95%, completed 8.5 of 27min, stopped early due to leg pain  02/02/2016: 23mwt - 445m/1378ft, lowest sat 97%, highest HR 80. Again on supplemental O2 for PVD with Claudication (given by Cards and Vasc Surg)  and OSA with hypoxemia at night.   Plan: - cont with Advair to 500/50 (90 day rx) - 1 puff in the AM and PM - gargle and rinse after each use.  - cont with Spiriva as directed.  - albuterol rescue inhaler - 2puff every 3-4 hours as needed for shortness of breath\wheezing\recurrent cough - may use albuterol rescue inhaler, 2 puffs, 15 mins prior to any exertional activities (lawn care, exercise, etc).  - Oxygen via nasal cannula, 2 L at night, with naps/exercise, may take breaks when sedentary (watching TV sitting on couch, etc.) disease.   I have personally obtained a history, examined the patient, evaluated Pertinent laboratory and RadioGraphic/imaging results, and  formulated the assessment and plan   The Patient requires high complexity decision making for assessment and support, frequent evaluation and titration of therapies.  Patient/Family are satisfied with Plan of action and management. All questions answered  Corrin Parker, M.D.  [facility]  Medical Director [place] Director [facility] Cardio-Pulmonary Department

## 2016-06-01 NOTE — Patient Instructions (Addendum)
Follow up in 3 months No changes in medicines and inhalers

## 2016-08-01 ENCOUNTER — Emergency Department: Payer: Medicare Other

## 2016-08-01 ENCOUNTER — Inpatient Hospital Stay
Admission: EM | Admit: 2016-08-01 | Discharge: 2016-08-03 | DRG: 291 | Disposition: A | Discharge status: Home / Self Care | Payer: Medicare Other | Attending: Internal Medicine | Admitting: Internal Medicine

## 2016-08-01 ENCOUNTER — Encounter: Payer: Self-pay | Admitting: Emergency Medicine

## 2016-08-01 DIAGNOSIS — Z951 Presence of aortocoronary bypass graft: Secondary | ICD-10-CM | POA: Diagnosis not present

## 2016-08-01 DIAGNOSIS — E1122 Type 2 diabetes mellitus with diabetic chronic kidney disease: Secondary | ICD-10-CM | POA: Diagnosis present

## 2016-08-01 DIAGNOSIS — Z87891 Personal history of nicotine dependence: Secondary | ICD-10-CM | POA: Diagnosis not present

## 2016-08-01 DIAGNOSIS — Z794 Long term (current) use of insulin: Secondary | ICD-10-CM

## 2016-08-01 DIAGNOSIS — N183 Chronic kidney disease, stage 3 (moderate): Secondary | ICD-10-CM | POA: Diagnosis present

## 2016-08-01 DIAGNOSIS — Z9981 Dependence on supplemental oxygen: Secondary | ICD-10-CM

## 2016-08-01 DIAGNOSIS — I482 Chronic atrial fibrillation: Secondary | ICD-10-CM | POA: Diagnosis present

## 2016-08-01 DIAGNOSIS — Z66 Do not resuscitate: Secondary | ICD-10-CM | POA: Diagnosis present

## 2016-08-01 DIAGNOSIS — Z885 Allergy status to narcotic agent status: Secondary | ICD-10-CM | POA: Diagnosis not present

## 2016-08-01 DIAGNOSIS — K219 Gastro-esophageal reflux disease without esophagitis: Secondary | ICD-10-CM | POA: Diagnosis present

## 2016-08-01 DIAGNOSIS — Z888 Allergy status to other drugs, medicaments and biological substances status: Secondary | ICD-10-CM

## 2016-08-01 DIAGNOSIS — I4892 Unspecified atrial flutter: Secondary | ICD-10-CM | POA: Diagnosis present

## 2016-08-01 DIAGNOSIS — Z7951 Long term (current) use of inhaled steroids: Secondary | ICD-10-CM

## 2016-08-01 DIAGNOSIS — J9601 Acute respiratory failure with hypoxia: Secondary | ICD-10-CM | POA: Diagnosis present

## 2016-08-01 DIAGNOSIS — Z7982 Long term (current) use of aspirin: Secondary | ICD-10-CM

## 2016-08-01 DIAGNOSIS — I2510 Atherosclerotic heart disease of native coronary artery without angina pectoris: Secondary | ICD-10-CM | POA: Diagnosis present

## 2016-08-01 DIAGNOSIS — G4733 Obstructive sleep apnea (adult) (pediatric): Secondary | ICD-10-CM | POA: Diagnosis present

## 2016-08-01 DIAGNOSIS — I447 Left bundle-branch block, unspecified: Secondary | ICD-10-CM | POA: Diagnosis present

## 2016-08-01 DIAGNOSIS — Z79899 Other long term (current) drug therapy: Secondary | ICD-10-CM

## 2016-08-01 DIAGNOSIS — I4891 Unspecified atrial fibrillation: Secondary | ICD-10-CM

## 2016-08-01 DIAGNOSIS — I4581 Long QT syndrome: Secondary | ICD-10-CM | POA: Diagnosis present

## 2016-08-01 DIAGNOSIS — Z7901 Long term (current) use of anticoagulants: Secondary | ICD-10-CM

## 2016-08-01 DIAGNOSIS — I130 Hypertensive heart and chronic kidney disease with heart failure and stage 1 through stage 4 chronic kidney disease, or unspecified chronic kidney disease: Secondary | ICD-10-CM | POA: Diagnosis not present

## 2016-08-01 DIAGNOSIS — F419 Anxiety disorder, unspecified: Secondary | ICD-10-CM | POA: Diagnosis present

## 2016-08-01 DIAGNOSIS — Z880 Allergy status to penicillin: Secondary | ICD-10-CM

## 2016-08-01 DIAGNOSIS — I5033 Acute on chronic diastolic (congestive) heart failure: Secondary | ICD-10-CM | POA: Diagnosis present

## 2016-08-01 DIAGNOSIS — I509 Heart failure, unspecified: Secondary | ICD-10-CM

## 2016-08-01 DIAGNOSIS — J441 Chronic obstructive pulmonary disease with (acute) exacerbation: Secondary | ICD-10-CM | POA: Diagnosis present

## 2016-08-01 DIAGNOSIS — R0602 Shortness of breath: Secondary | ICD-10-CM | POA: Diagnosis not present

## 2016-08-01 LAB — CBC WITH DIFFERENTIAL/PLATELET
BASOS PCT: 1 %
Basophils Absolute: 0.1 10*3/uL (ref 0–0.1)
EOS ABS: 0.3 10*3/uL (ref 0–0.7)
Eosinophils Relative: 3 %
HEMATOCRIT: 39.1 % — AB (ref 40.0–52.0)
HEMOGLOBIN: 12.9 g/dL — AB (ref 13.0–18.0)
LYMPHS ABS: 1.1 10*3/uL (ref 1.0–3.6)
Lymphocytes Relative: 10 %
MCH: 29.1 pg (ref 26.0–34.0)
MCHC: 32.9 g/dL (ref 32.0–36.0)
MCV: 88.3 fL (ref 80.0–100.0)
Monocytes Absolute: 0.9 10*3/uL (ref 0.2–1.0)
Monocytes Relative: 8 %
NEUTROS PCT: 78 %
Neutro Abs: 8.9 10*3/uL — ABNORMAL HIGH (ref 1.4–6.5)
Platelets: 192 10*3/uL (ref 150–440)
RBC: 4.42 MIL/uL (ref 4.40–5.90)
RDW: 14.7 % — ABNORMAL HIGH (ref 11.5–14.5)
WBC: 11.2 10*3/uL — AB (ref 3.8–10.6)

## 2016-08-01 LAB — BLOOD GAS, VENOUS
Acid-Base Excess: 5.5 mmol/L — ABNORMAL HIGH (ref 0.0–2.0)
Bicarbonate: 32.4 mmol/L — ABNORMAL HIGH (ref 20.0–28.0)
PCO2 VEN: 56 mmHg (ref 44.0–60.0)
PH VEN: 7.37 (ref 7.250–7.430)
Patient temperature: 37
pO2, Ven: <31 mmHg — CL (ref 32.0–45.0)

## 2016-08-01 LAB — LACTIC ACID, PLASMA: Lactic Acid, Venous: 1.1 mmol/L (ref 0.5–1.9)

## 2016-08-01 LAB — INFLUENZA PANEL BY PCR (TYPE A & B)
INFLAPCR: NEGATIVE
INFLBPCR: NEGATIVE

## 2016-08-01 LAB — BASIC METABOLIC PANEL
Anion gap: 8 (ref 5–15)
BUN: 31 mg/dL — ABNORMAL HIGH (ref 6–20)
CHLORIDE: 99 mmol/L — AB (ref 101–111)
CO2: 28 mmol/L (ref 22–32)
Calcium: 8.9 mg/dL (ref 8.9–10.3)
Creatinine, Ser: 1.16 mg/dL (ref 0.61–1.24)
GFR calc non Af Amer: >60 mL/min (ref >60)
Glucose, Bld: 267 mg/dL — ABNORMAL HIGH (ref 65–99)
POTASSIUM: 4.3 mmol/L (ref 3.5–5.1)
SODIUM: 135 mmol/L (ref 135–145)

## 2016-08-01 LAB — MAGNESIUM: MAGNESIUM: 2.2 mg/dL (ref 1.7–2.4)

## 2016-08-01 LAB — GLUCOSE, CAPILLARY
GLUCOSE-CAPILLARY: 333 mg/dL — AB (ref 65–99)
GLUCOSE-CAPILLARY: 450 mg/dL — AB (ref 65–99)

## 2016-08-01 LAB — TROPONIN I

## 2016-08-01 LAB — BRAIN NATRIURETIC PEPTIDE: B NATRIURETIC PEPTIDE 5: 447 pg/mL — AB (ref 0.0–100.0)

## 2016-08-01 MED ORDER — INSULIN ASPART 100 UNIT/ML ~~LOC~~ SOLN
0.0000 [IU] | Freq: Three times a day (TID) | SUBCUTANEOUS | Status: DC
  Administered 2016-08-01: 15 [IU] via SUBCUTANEOUS
  Administered 2016-08-02: 11 [IU] via SUBCUTANEOUS
  Administered 2016-08-02: 5 [IU] via SUBCUTANEOUS
  Administered 2016-08-02: 15 [IU] via SUBCUTANEOUS
  Administered 2016-08-02: 8 [IU] via SUBCUTANEOUS
  Administered 2016-08-03: 11 [IU] via SUBCUTANEOUS
  Administered 2016-08-03: 8 [IU] via SUBCUTANEOUS
  Filled 2016-08-01: qty 15
  Filled 2016-08-01: qty 8
  Filled 2016-08-01: qty 15
  Filled 2016-08-01: qty 11
  Filled 2016-08-01: qty 5
  Filled 2016-08-01: qty 8
  Filled 2016-08-01: qty 11

## 2016-08-01 MED ORDER — FLUOCINOLONE ACETONIDE 0.01 % OT OIL: 2.0000 [drp] | TOPICAL_OIL | Freq: Two times a day (BID) | OTIC | Status: DC

## 2016-08-01 MED ORDER — METOPROLOL TARTRATE 50 MG PO TABS
100.0000 mg | ORAL_TABLET | Freq: Two times a day (BID) | ORAL | Status: DC
  Administered 2016-08-01 – 2016-08-03 (×4): 100 mg via ORAL
  Filled 2016-08-01 (×4): qty 2

## 2016-08-01 MED ORDER — DOXYCYCLINE HYCLATE 100 MG PO TABS
100.0000 mg | ORAL_TABLET | Freq: Two times a day (BID) | ORAL | Status: DC
  Administered 2016-08-01 – 2016-08-02 (×2): 100 mg via ORAL
  Filled 2016-08-01 (×2): qty 1

## 2016-08-01 MED ORDER — MAGNESIUM SULFATE 2 GM/50ML IV SOLN
2.0000 g | Freq: Once | INTRAVENOUS | Status: AC
  Administered 2016-08-01: 2 g via INTRAVENOUS
  Filled 2016-08-01: qty 50

## 2016-08-01 MED ORDER — FUROSEMIDE 10 MG/ML IJ SOLN
60.0000 mg | Freq: Two times a day (BID) | INTRAMUSCULAR | Status: DC
  Administered 2016-08-01 – 2016-08-02 (×2): 60 mg via INTRAVENOUS
  Filled 2016-08-01 (×2): qty 6

## 2016-08-01 MED ORDER — METOPROLOL TARTRATE 50 MG PO TABS
50.0000 mg | ORAL_TABLET | Freq: Once | ORAL | Status: AC
Start: 2016-08-01 — End: 2016-08-01
  Administered 2016-08-01: 50 mg via ORAL
  Filled 2016-08-01: qty 1

## 2016-08-01 MED ORDER — MOMETASONE FURO-FORMOTEROL FUM 200-5 MCG/ACT IN AERO
2.0000 | INHALATION_SPRAY | Freq: Two times a day (BID) | RESPIRATORY_TRACT | Status: DC
  Administered 2016-08-01 – 2016-08-03 (×4): 2 via RESPIRATORY_TRACT
  Filled 2016-08-01: qty 8.8

## 2016-08-01 MED ORDER — CITALOPRAM HYDROBROMIDE 20 MG PO TABS
20.0000 mg | ORAL_TABLET | Freq: Every day | ORAL | Status: DC
  Administered 2016-08-01 – 2016-08-02 (×2): 20 mg via ORAL
  Filled 2016-08-01 (×2): qty 1

## 2016-08-01 MED ORDER — INSULIN ASPART 100 UNIT/ML ~~LOC~~ SOLN: 0.0000 [IU] | Freq: Every day | SUBCUTANEOUS | Status: DC

## 2016-08-01 MED ORDER — DOXYCYCLINE HYCLATE 100 MG PO TABS
100.0000 mg | ORAL_TABLET | Freq: Once | ORAL | Status: AC
  Administered 2016-08-01: 100 mg via ORAL
  Filled 2016-08-01: qty 1

## 2016-08-01 MED ORDER — ACETAMINOPHEN 325 MG PO TABS
650.0000 mg | ORAL_TABLET | Freq: Four times a day (QID) | ORAL | Status: DC | PRN
  Administered 2016-08-01: 650 mg via ORAL
  Filled 2016-08-01: qty 2

## 2016-08-01 MED ORDER — POLYETHYLENE GLYCOL 3350 17 G PO PACK: 17.0000 g | PACK | Freq: Every day | ORAL | Status: DC | PRN

## 2016-08-01 MED ORDER — ASPIRIN EC 81 MG PO TBEC
81.0000 mg | DELAYED_RELEASE_TABLET | Freq: Every day | ORAL | Status: DC
  Administered 2016-08-02 – 2016-08-03 (×2): 81 mg via ORAL
  Filled 2016-08-01 (×2): qty 1

## 2016-08-01 MED ORDER — PANTOPRAZOLE SODIUM 40 MG PO TBEC
40.0000 mg | DELAYED_RELEASE_TABLET | Freq: Every day | ORAL | Status: DC
  Administered 2016-08-02 – 2016-08-03 (×2): 40 mg via ORAL
  Filled 2016-08-01 (×2): qty 1

## 2016-08-01 MED ORDER — ACETAMINOPHEN 650 MG RE SUPP: 650.0000 mg | Freq: Four times a day (QID) | RECTAL | Status: DC | PRN

## 2016-08-01 MED ORDER — ONDANSETRON HCL 4 MG/2ML IJ SOLN: 4.0000 mg | Freq: Four times a day (QID) | INTRAMUSCULAR | Status: DC | PRN

## 2016-08-01 MED ORDER — ALBUTEROL SULFATE (2.5 MG/3ML) 0.083% IN NEBU: 2.5000 mg | INHALATION_SOLUTION | RESPIRATORY_TRACT | Status: DC | PRN

## 2016-08-01 MED ORDER — INSULIN ASPART 100 UNIT/ML ~~LOC~~ SOLN
12.0000 [IU] | Freq: Three times a day (TID) | SUBCUTANEOUS | Status: DC
  Administered 2016-08-02 – 2016-08-03 (×5): 12 [IU] via SUBCUTANEOUS
  Filled 2016-08-01 (×5): qty 12

## 2016-08-01 MED ORDER — PRAVASTATIN SODIUM 20 MG PO TABS
20.0000 mg | ORAL_TABLET | Freq: Every day | ORAL | Status: DC
  Administered 2016-08-01 – 2016-08-02 (×2): 20 mg via ORAL
  Filled 2016-08-01 (×2): qty 1

## 2016-08-01 MED ORDER — MAGNESIUM SULFATE 2 GM/50ML IV SOLN
INTRAVENOUS | Status: AC
  Filled 2016-08-01: qty 50

## 2016-08-01 MED ORDER — LISINOPRIL 5 MG PO TABS
5.0000 mg | ORAL_TABLET | Freq: Every day | ORAL | Status: DC
  Administered 2016-08-02 – 2016-08-03 (×2): 5 mg via ORAL
  Filled 2016-08-01 (×2): qty 1

## 2016-08-01 MED ORDER — DIAZEPAM 5 MG PO TABS: 5.0000 mg | ORAL_TABLET | Freq: Two times a day (BID) | ORAL | Status: DC | PRN

## 2016-08-01 MED ORDER — METHYLPREDNISOLONE SODIUM SUCC 125 MG IJ SOLR
125.0000 mg | Freq: Once | INTRAMUSCULAR | Status: AC
  Administered 2016-08-01: 125 mg via INTRAVENOUS
  Filled 2016-08-01: qty 2

## 2016-08-01 MED ORDER — INSULIN GLARGINE 100 UNIT/ML ~~LOC~~ SOLN
60.0000 [IU] | Freq: Every day | SUBCUTANEOUS | Status: DC
  Administered 2016-08-01 – 2016-08-02 (×2): 60 [IU] via SUBCUTANEOUS
  Filled 2016-08-01 (×3): qty 0.6

## 2016-08-01 MED ORDER — ONDANSETRON HCL 4 MG PO TABS: 4.0000 mg | ORAL_TABLET | Freq: Four times a day (QID) | ORAL | Status: DC | PRN

## 2016-08-01 MED ORDER — APIXABAN 5 MG PO TABS
5.0000 mg | ORAL_TABLET | Freq: Two times a day (BID) | ORAL | Status: DC
  Administered 2016-08-01 – 2016-08-03 (×4): 5 mg via ORAL
  Filled 2016-08-01 (×4): qty 1

## 2016-08-01 MED ORDER — IPRATROPIUM-ALBUTEROL 0.5-2.5 (3) MG/3ML IN SOLN
3.0000 mL | Freq: Once | RESPIRATORY_TRACT | Status: AC
  Administered 2016-08-01: 3 mL via RESPIRATORY_TRACT
  Filled 2016-08-01: qty 3

## 2016-08-01 MED ORDER — SODIUM CHLORIDE 0.9% FLUSH
3.0000 mL | Freq: Two times a day (BID) | INTRAVENOUS | Status: DC
  Administered 2016-08-01 – 2016-08-03 (×4): 3 mL via INTRAVENOUS

## 2016-08-01 MED ORDER — SODIUM CHLORIDE 0.9% FLUSH: 3.0000 mL | INTRAVENOUS | Status: DC | PRN

## 2016-08-01 MED ORDER — INSULIN ASPART 100 UNIT/ML ~~LOC~~ SOLN
0.0000 [IU] | Freq: Three times a day (TID) | SUBCUTANEOUS | Status: DC
  Administered 2016-08-01: 11 [IU] via SUBCUTANEOUS
  Filled 2016-08-01: qty 11

## 2016-08-01 MED ORDER — TIOTROPIUM BROMIDE MONOHYDRATE 18 MCG IN CAPS
18.0000 ug | ORAL_CAPSULE | Freq: Every day | RESPIRATORY_TRACT | Status: DC
  Administered 2016-08-02 – 2016-08-03 (×2): 18 ug via RESPIRATORY_TRACT
  Filled 2016-08-01: qty 5

## 2016-08-01 MED ORDER — AMLODIPINE BESYLATE 5 MG PO TABS
5.0000 mg | ORAL_TABLET | Freq: Every day | ORAL | Status: DC
  Administered 2016-08-02 – 2016-08-03 (×2): 5 mg via ORAL
  Filled 2016-08-01 (×2): qty 1

## 2016-08-01 MED ORDER — METOPROLOL TARTRATE 5 MG/5ML IV SOLN
5.0000 mg | Freq: Once | INTRAVENOUS | Status: AC
Start: 2016-08-01 — End: 2016-08-01
  Administered 2016-08-01: 5 mg via INTRAVENOUS
  Filled 2016-08-01: qty 5

## 2016-08-01 MED ORDER — SODIUM CHLORIDE 0.9 % IV SOLN: 250.0000 mL | INTRAVENOUS | Status: DC | PRN

## 2016-08-01 MED ORDER — METFORMIN HCL 500 MG PO TABS
1000.0000 mg | ORAL_TABLET | Freq: Every evening | ORAL | Status: DC
  Administered 2016-08-01: 1000 mg via ORAL
  Filled 2016-08-01: qty 2

## 2016-08-01 MED ORDER — METHYLPREDNISOLONE SODIUM SUCC 125 MG IJ SOLR
60.0000 mg | Freq: Two times a day (BID) | INTRAMUSCULAR | Status: DC
  Administered 2016-08-01 – 2016-08-02 (×2): 60 mg via INTRAVENOUS
  Filled 2016-08-01 (×2): qty 2

## 2016-08-01 MED ORDER — DIPHENHYDRAMINE HCL 25 MG PO CAPS: 25.0000 mg | ORAL_CAPSULE | Freq: Three times a day (TID) | ORAL | Status: DC | PRN

## 2016-08-01 MED ORDER — BISACODYL 10 MG RE SUPP
10.0000 mg | Freq: Every day | RECTAL | Status: DC | PRN
  Filled 2016-08-01: qty 1

## 2016-08-01 MED ORDER — SODIUM CHLORIDE 0.9% FLUSH
3.0000 mL | Freq: Two times a day (BID) | INTRAVENOUS | Status: DC
  Administered 2016-08-02: 3 mL via INTRAVENOUS

## 2016-08-01 MED ORDER — FUROSEMIDE 10 MG/ML IJ SOLN
40.0000 mg | Freq: Once | INTRAMUSCULAR | Status: AC
  Administered 2016-08-01: 40 mg via INTRAVENOUS
  Filled 2016-08-01: qty 4

## 2016-08-01 NOTE — ED Notes (Signed)
Patient placed on 4L [place] at this time.

## 2016-08-01 NOTE — ED Provider Notes (Signed)
[facility] Emergency Department Provider Note  ____________________________________________  Time seen: Approximately 12:48 PM  I have reviewed the triage vital signs and the nursing notes.   HISTORY  Chief Complaint Shortness of Breath   HPI Ernest Mclean is a 76 y.o. male with h/o afib on Eliquis, CAD, CKD, COPD, CHF (EF 45-50%on ECHO 06/2015), OSA on CPAP, DM, HTN who presents for evaluation of shortness of breath patient endorses 5 days of generalized weakness and chills and progressively worsening shortness of breath. Today he started coughing and noticed that he was hypoxic on RA. Patient then used his incentive spirometer and had a reading of 1500 (patient baseline is 2500). Since Thursday patient has been using oxygen constantly at home. He denies fever. He started to have a dry cough today. Also endorses body aches. No nausea, no vomiting, no diarrhea, no orthopnea, no chest pain, no abdominal pain. According to his wife patient gained 2 lbs since yesterday but he denies leg swelling or orthopnea.   Past Medical History:  Diagnosis Date  . Atrial fibrillation ([place])   . CAD (coronary artery disease)   . CKD (chronic kidney disease)   . Congestive heart failure ([place])   . COPD (chronic obstructive pulmonary disease) ([place])   . Diabetes ([place])   . Hypertension   . OSA on CPAP     Patient Active Problem List   Diagnosis Date Noted  . Anxiety 02/22/2016  . Chronic respiratory failure ([place]) 08/10/2015  . Chronic diastolic heart failure ([place]) 07/20/2015  . Hypoglycemia 05/12/2015  . Type 2 diabetes mellitus ([place]) 05/12/2015  . HTN (hypertension) 05/12/2015  . CAD (coronary artery disease) 05/12/2015  . Atrial flutter ([place]) 05/12/2015  . GERD (gastroesophageal reflux disease) 05/12/2015  . Solitary pulmonary nodule 02/10/2015  . OSA on CPAP 08/17/2014  . COPD (chronic obstructive pulmonary disease) ([place]) 08/17/2014    Past Surgical History:    Procedure Laterality Date  . CLAVICLE SURGERY    . heart bypass    . HERNIA REPAIR      Prior to Admission medications   Medication Sig Start Date End Date Taking? Authorizing Provider  albuterol (PROVENTIL HFA;VENTOLIN HFA) 108 (90 BASE) MCG/ACT inhaler Inhale 2 puffs into the lungs every 4 (four) hours as needed for wheezing or shortness of breath. 10/29/14  Yes Vishal Mungal, MD  amLODipine (NORVASC) 5 MG tablet Take 5 mg by mouth daily.  08/17/14  Yes Historical Provider, MD  apixaban (ELIQUIS) 5 MG TABS tablet Take 5 mg by mouth 2 (two) times daily. 08/17/14  Yes Historical Provider, MD  aspirin EC 81 MG tablet Take 81 mg by mouth daily. 08/17/14  Yes Historical Provider, MD  citalopram (CELEXA) 20 MG tablet Take 20 mg by mouth at bedtime.  07/30/14  Yes Historical Provider, MD  CRANBERRY PO Take 820 mg by mouth daily.   Yes Historical Provider, MD  cyanocobalamin 500 MCG tablet Take 500 mcg by mouth daily.   Yes Historical Provider, MD  desonide (DESOWEN) 0.05 % cream Apply 1 application topically 2 (two) times daily. 05/10/16  Yes Historical Provider, MD  diazepam (VALIUM) 5 MG tablet Take 5 mg by mouth every 12 (twelve) hours as needed for anxiety.  10/24/13  Yes Historical Provider, MD  Fluocinolone Acetonide 0.01 % OIL Place 2 drops into both ears 2 (two) times daily. 05/10/16  Yes Historical Provider, MD  Fluticasone-Salmeterol (ADVAIR DISKUS) 500-50 MCG/DOSE AEPB Inhale 1 puff into the lungs 2 (two) times daily.  01/12/16  Yes Vishal Mungal, MD  furosemide (LASIX) 40 MG tablet Take one tablet daily.  Can take extra tablet in afternoon if increase weight gain of 3 pounds or increased swelling. 07/11/15  Yes Richard Leslye Peer, MD  insulin lispro (HUMALOG) 100 UNIT/ML injection Inject 12-23 Units into the skin 3 (three) times daily before meals. 12 units before breakfast, 18 units before lunch, and 23 units before supper plus additional units for sliding scale   Yes Historical Provider, MD   ketoconazole (NIZORAL) 2 % shampoo Apply 1 application topically 2 (two) times a week. 05/11/16  Yes Historical Provider, MD  LANTUS 100 UNIT/ML injection Inject 0.75 mLs (75 Units total) into the skin at bedtime. Patient taking differently: Inject 60 Units into the skin at bedtime.  05/14/15  Yes Gladstone Lighter, MD  lisinopril (PRINIVIL,ZESTRIL) 5 MG tablet Take 5 mg by mouth daily.   Yes Historical Provider, MD  metFORMIN (GLUCOPHAGE) 500 MG tablet Take 1,000 mg by mouth every evening.    Yes Historical Provider, MD  metoprolol (LOPRESSOR) 100 MG tablet Take 100 mg by mouth 2 (two) times daily. 06/17/14  Yes Historical Provider, MD  OMEGA-3 FATTY ACIDS-VITAMIN E PO Take 1 capsule by mouth daily.   Yes Historical Provider, MD  omeprazole (PRILOSEC) 20 MG capsule Take 20 mg by mouth daily. 08/07/14  Yes Historical Provider, MD  pravastatin (PRAVACHOL) 20 MG tablet Take 20 mg by mouth at bedtime. 07/31/14  Yes Historical Provider, MD  tiotropium (SPIRIVA) 18 MCG inhalation capsule Place 18 mcg into inhaler and inhale daily. 07/31/14  Yes Historical Provider, MD    Allergies Hydralazine; Penicillins; and Codeine  Family History  Problem Relation Age of Onset  . Stroke    . Diabetes    . Breast cancer    . Colon cancer      Social History Social History  Substance Use Topics  . Smoking status: Former Smoker    Packs/day: 1.00    Years: 30.00    Types: Cigarettes  . Smokeless tobacco: Never Used     Comment: quit 12/14/2001  . Alcohol use No    Review of Systems  Constitutional: Negative for fever. + body aches, chills Eyes: Negative for visual changes. ENT: Negative for sore throat. Neck: No neck pain  Cardiovascular: Negative for chest pain. Respiratory: + shortness of breath, cough Gastrointestinal: Negative for abdominal pain, vomiting or diarrhea. Genitourinary: Negative for dysuria. Musculoskeletal: Negative for back pain. Skin: Negative for rash. Neurological:  Negative for headaches, weakness or numbness. Psych: No SI or HI  ____________________________________________   PHYSICAL EXAM:  VITAL SIGNS: ED Triage Vitals  Enc Vitals Group     BP 08/01/16 1230 (!) 135/45     Pulse Rate 08/01/16 1230 65     Resp 08/01/16 1230 18     Temp 08/01/16 1230 98.4 F (36.9 C)     Temp Source 08/01/16 1230 Oral     SpO2 08/01/16 1230 90 %     Weight 08/01/16 1231 215 lb (97.5 kg)     Height 08/01/16 1231 6' (1.829 m)     Head Circumference --      Peak Flow --      Pain Score --      Pain Loc --      Pain Edu? --      Excl. in [place]? --     Constitutional: Alert and oriented. Well appearing and in no apparent distress. HEENT:      Head:  Normocephalic and atraumatic.         Eyes: Conjunctivae are normal. Sclera is non-icteric. EOMI. PERRL      Mouth/Throat: Mucous membranes are moist.       Neck: Supple with no signs of meningismus. Cardiovascular: Regular rate and rhythm. No murmurs, gallops, or rubs. 2+ symmetrical distal pulses are present in all extremities. No JVD. Respiratory: Hypoxic on room air which improved on 4 L nasal cannula, patient has severely diminished air movement bilaterally, no crackles or wheezes  Gastrointestinal: Soft, non tender, and non distended with positive bowel sounds. No rebound or guarding. Musculoskeletal: Nontender with normal range of motion in all extremities. No edema, cyanosis, or erythema of extremities. Neurologic: Normal speech and language. Face is symmetric. Moving all extremities. No gross focal neurologic deficits are appreciated. Skin: Skin is warm, dry and intact. No rash noted. Psychiatric: Mood and affect are normal. Speech and behavior are normal.  ____________________________________________   LABS (all labs ordered are listed, but only abnormal results are displayed)  Labs Reviewed  CBC WITH DIFFERENTIAL/PLATELET - Abnormal; Notable for the following:       Result Value   WBC 11.2 (*)     Hemoglobin 12.9 (*)    HCT 39.1 (*)    RDW 14.7 (*)    Neutro Abs 8.9 (*)    All other components within normal limits  BASIC METABOLIC PANEL - Abnormal; Notable for the following:    Chloride 99 (*)    Glucose, Bld 267 (*)    BUN 31 (*)    All other components within normal limits  BRAIN NATRIURETIC PEPTIDE - Abnormal; Notable for the following:    B Natriuretic Peptide 447.0 (*)    All other components within normal limits  BLOOD GAS, VENOUS - Abnormal; Notable for the following:    pO2, Ven <31.0 (*)    Bicarbonate 32.4 (*)    Acid-Base Excess 5.5 (*)    All other components within normal limits  TROPONIN I  LACTIC ACID, PLASMA  INFLUENZA PANEL BY PCR (TYPE A & B)  MAGNESIUM   ____________________________________________  EKG  ED ECG REPORT I, Rudene Re, the attending physician, personally viewed and interpreted this ECG.  Atrial flutter, rate of 66, normal QTc interval, left bundle branch block, left axis deviation, no ST elevations or depressions. EKG is unchanged from prior.  15:21 - A flutter with rapid ventricular rate of 122, normal QRS, prolonged QTC, normal axis, ST depressions on inferior leads, no ST elevation. ____________________________________________  RADIOLOGY  CXR: Pulmonary edema superimposed upon chronic changes.  Small pleural effusions.  CT detected pulmonary nodules better delineated on recent CT. ____________________________________________   PROCEDURES  Procedure(s) performed: None Procedures Critical Care performed: yes  CRITICAL CARE Performed by: Rudene Re  ?  Total critical care time: 40 min  Critical care time was exclusive of separately billable procedures and treating other patients.  Critical care was necessary to treat or prevent imminent or life-threatening deterioration.  Critical care was time spent personally by me on the following activities: development of treatment plan with patient and/or  surrogate as well as nursing, discussions with consultants, evaluation of patient's response to treatment, examination of patient, obtaining history from patient or surrogate, ordering and performing treatments and interventions, ordering and review of laboratory studies, ordering and review of radiographic studies, pulse oximetry and re-evaluation of patient's condition.  ____________________________________________   INITIAL IMPRESSION / ASSESSMENT AND PLAN / ED COURSE   76 y.o. male with h/o  afib on Eliquis, CAD, CKD, COPD, CHF (EF 45-50%on ECHO 06/2015), OSA on CPAP, DM, HTN who presents for evaluation of shortness of breath, dry cough, chills, body aches, and generalized weakness x 5 days. Patient in mild respiratory distress, hypoxic on room air which improved on 4 L nasal cannula, has severely diminished air movement bilaterally with no wheezing or crackles. Remaining of his vital signs are within normal limits. Patient is afebrile. We'll check chest x-ray to evaluate for pneumonia, flu swab to evaluate for influenza. We'll check basic blood work including troponin and BNP to evaluate for exacerbation of heart failure. Will start patient on DuoNeb treatments and Solu-Medrol.    _________________________ 3:27 PM on 08/01/2016 ----------------------------------------- Patient now in a flutter with RVR after receiving 3 duo nebs. We'll give a dose of IV metoprolol and give IV magnesium. Flu is negative, chest x-ray showing pulmonary edema, patient received 40 mg of IV Lasix on top of his 40 by mouth Lasix at home this morning. Still not making any urine. Troponin is negative. Kidney function is within normal limits. Patient started on doxycycline and steroids for COPD exacerbation. Will admit to Hospitalist.   Pertinent labs & imaging results that were available during my care of the patient were reviewed by me and considered in my medical decision making (see chart for  details).    ____________________________________________   FINAL CLINICAL IMPRESSION(S) / ED DIAGNOSES  Final diagnoses:  COPD exacerbation ([place])  Acute on chronic congestive heart failure, unspecified congestive heart failure type ([place])  Atrial fibrillation with RVR ([facility])      NEW MEDICATIONS STARTED DURING THIS VISIT:  New Prescriptions   No medications on file     Note:  This document was prepared using Dragon voice recognition software and may include unintentional dictation errors.    Rudene Re, MD 08/01/16 [phone]

## 2016-08-01 NOTE — ED Triage Notes (Signed)
Patient presents to ED via POV with c/o low oxygen levels. Patient states his SpO2 was 88% on RA. Patient uses CPAP at night. Patient also uses IS at home. Patient usually gets 2500 but recently has only been able to get 1500. A&O x4.

## 2016-08-01 NOTE — ED Notes (Signed)
Pt given diet ginger ale to drink  

## 2016-08-01 NOTE — Progress Notes (Signed)
Notified physician of the blood sugar level of 450. Dr.Willis changed sliding scale order and gave verbal orders to give 15units of insulin.

## 2016-08-01 NOTE — ED Notes (Signed)
Report called to tammy rn floor nurse.  Pt alert.  No chest pain.   Skin warm and dry.  Family with pt

## 2016-08-01 NOTE — H&P (Signed)
[place] [facility] [place] NAME: Ernest Mclean    MR#:  [id number]  DATE OF BIRTH:  September 10, 1940  DATE OF ADMISSION:  08/01/2016  PRIMARY CARE PHYSICIAN: Juluis Pitch, MD   REQUESTING/REFERRING PHYSICIAN: Dr. Alfred Levins  CHIEF COMPLAINT:   Chief Complaint  Patient presents with  . Shortness of Breath    HISTORY OF PRESENT ILLNESS:  Ernest Mclean  is a 76 y.o. male with a known history of Systolic CHF, hypertension, diabetes mellitus, COPD not on home oxygen presents to the emergency room due to worsening shortness of breath, fatigue, chills and saturations at 79% on room air. Here he has been found to have chest x-ray with pulmonary edema and small bilateral pleural effusions and is being admitted to the hospitalist service. Here he received multiple nebulizer treatment in the emergency room and presently is in atrial flutter with rapid ventricular rate. Influenza A and B-.  PAST MEDICAL HISTORY:   Past Medical History:  Diagnosis Date  . Atrial fibrillation ([place])   . CAD (coronary artery disease)   . CKD (chronic kidney disease)   . Congestive heart failure ([place])   . COPD (chronic obstructive pulmonary disease) ([place])   . Diabetes ([place])   . Hypertension   . OSA on CPAP     PAST SURGICAL HISTORY:   Past Surgical History:  Procedure Laterality Date  . CLAVICLE SURGERY    . heart bypass    . HERNIA REPAIR      SOCIAL HISTORY:   Social History  Substance Use Topics  . Smoking status: Former Smoker    Packs/day: 1.00    Years: 30.00    Types: Cigarettes  . Smokeless tobacco: Never Used     Comment: quit 12/14/2001  . Alcohol use No    FAMILY HISTORY:   Family History  Problem Relation Age of Onset  . Stroke    . Diabetes    . Breast cancer    . Colon cancer      DRUG ALLERGIES:   Allergies  Allergen Reactions  . Hydralazine Other (See Comments)    tongue swollen and couldn't wake up ---- not positive it was this or a mix  of this with something else or high sugar  . Penicillins Other (See Comments)    Passed out (at 76 yrs old) Has patient had a PCN reaction causing immediate rash, facial/tongue/throat swelling, SOB or lightheadedness with hypotension: No Has patient had a PCN reaction causing severe rash involving mucus membranes or skin necrosis: No Has patient had a PCN reaction that required hospitalization No Has patient had a PCN reaction occurring within the last 10 years: No If all of the above answers are "NO", then may proceed with Cephalosporin use.   . Codeine Hives, Rash and Swelling    REVIEW OF SYSTEMS:   Review of Systems  Constitutional: Positive for chills and malaise/fatigue. Negative for fever and weight loss.  HENT: Negative for hearing loss and nosebleeds.   Eyes: Negative for blurred vision, double vision and pain.  Respiratory: Positive for cough, shortness of breath and wheezing. Negative for hemoptysis and sputum production.   Cardiovascular: Negative for chest pain, palpitations, orthopnea and leg swelling.  Gastrointestinal: Negative for abdominal pain, constipation, diarrhea, nausea and vomiting.  Genitourinary: Negative for dysuria and hematuria.  Musculoskeletal: Negative for back pain, falls and myalgias.  Skin: Negative for rash.  Neurological: Positive for weakness. Negative for dizziness, tremors, sensory change, speech change, focal  weakness, seizures and headaches.  Endo/Heme/Allergies: Does not bruise/bleed easily.  Psychiatric/Behavioral: Negative for depression and memory loss. The patient is not nervous/anxious.     MEDICATIONS AT HOME:   Prior to Admission medications   Medication Sig Start Date End Date Taking? Authorizing Provider  albuterol (PROVENTIL HFA;VENTOLIN HFA) 108 (90 BASE) MCG/ACT inhaler Inhale 2 puffs into the lungs every 4 (four) hours as needed for wheezing or shortness of breath. 10/29/14  Yes Vishal Mungal, MD  amLODipine (NORVASC) 5 MG tablet  Take 5 mg by mouth daily.  08/17/14  Yes Historical Provider, MD  apixaban (ELIQUIS) 5 MG TABS tablet Take 5 mg by mouth 2 (two) times daily. 08/17/14  Yes Historical Provider, MD  aspirin EC 81 MG tablet Take 81 mg by mouth daily. 08/17/14  Yes Historical Provider, MD  citalopram (CELEXA) 20 MG tablet Take 20 mg by mouth at bedtime.  07/30/14  Yes Historical Provider, MD  CRANBERRY PO Take 820 mg by mouth daily.   Yes Historical Provider, MD  cyanocobalamin 500 MCG tablet Take 500 mcg by mouth daily.   Yes Historical Provider, MD  desonide (DESOWEN) 0.05 % cream Apply 1 application topically 2 (two) times daily. 05/10/16  Yes Historical Provider, MD  diazepam (VALIUM) 5 MG tablet Take 5 mg by mouth every 12 (twelve) hours as needed for anxiety.  10/24/13  Yes Historical Provider, MD  Fluocinolone Acetonide 0.01 % OIL Place 2 drops into both ears 2 (two) times daily. 05/10/16  Yes Historical Provider, MD  Fluticasone-Salmeterol (ADVAIR DISKUS) 500-50 MCG/DOSE AEPB Inhale 1 puff into the lungs 2 (two) times daily. 01/12/16  Yes Vishal Mungal, MD  furosemide (LASIX) 40 MG tablet Take one tablet daily.  Can take extra tablet in afternoon if increase weight gain of 3 pounds or increased swelling. 07/11/15  Yes Richard Leslye Peer, MD  insulin lispro (HUMALOG) 100 UNIT/ML injection Inject 12-23 Units into the skin 3 (three) times daily before meals. 12 units before breakfast, 18 units before lunch, and 23 units before supper plus additional units for sliding scale   Yes Historical Provider, MD  ketoconazole (NIZORAL) 2 % shampoo Apply 1 application topically 2 (two) times a week. 05/11/16  Yes Historical Provider, MD  LANTUS 100 UNIT/ML injection Inject 0.75 mLs (75 Units total) into the skin at bedtime. Patient taking differently: Inject 60 Units into the skin at bedtime.  05/14/15  Yes Gladstone Lighter, MD  lisinopril (PRINIVIL,ZESTRIL) 5 MG tablet Take 5 mg by mouth daily.   Yes Historical Provider, MD  metFORMIN  (GLUCOPHAGE) 500 MG tablet Take 1,000 mg by mouth every evening.    Yes Historical Provider, MD  metoprolol (LOPRESSOR) 100 MG tablet Take 100 mg by mouth 2 (two) times daily. 06/17/14  Yes Historical Provider, MD  OMEGA-3 FATTY ACIDS-VITAMIN E PO Take 1 capsule by mouth daily.   Yes Historical Provider, MD  omeprazole (PRILOSEC) 20 MG capsule Take 20 mg by mouth daily. 08/07/14  Yes Historical Provider, MD  pravastatin (PRAVACHOL) 20 MG tablet Take 20 mg by mouth at bedtime. 07/31/14  Yes Historical Provider, MD  tiotropium (SPIRIVA) 18 MCG inhalation capsule Place 18 mcg into inhaler and inhale daily. 07/31/14  Yes Historical Provider, MD     VITAL SIGNS:  Blood pressure (!) 150/71, pulse (!) 137, temperature 98.4 F (36.9 C), temperature source Oral, resp. rate 20, height 6' (1.829 m), weight 97.5 kg (215 lb), SpO2 95 %.  PHYSICAL EXAMINATION:  Physical Exam  GENERAL:  76 y.o.-year-old  patient lying in the bed with conversational dyspnea EYES: Pupils equal, round, reactive to light and accommodation. No scleral icterus. Extraocular muscles intact.  HEENT: Head atraumatic, normocephalic. Oropharynx and nasopharynx clear. No oropharyngeal erythema, moist oral mucosa  NECK:  Supple, no jugular venous distention. No thyroid enlargement, no tenderness.  LUNGS: Bilateral wheezing and crackles. Increased WOB CARDIOVASCULAR: Irregular. tachycardic ABDOMEN: Soft, nontender, nondistended. Bowel sounds present. No organomegaly or mass.  EXTREMITIES: No pedal edema, cyanosis, or clubbing. + 2 pedal & radial pulses b/l.   NEUROLOGIC: Cranial nerves II through XII are intact. No focal Motor or sensory deficits appreciated b/l PSYCHIATRIC: The patient is alert and oriented x 3. Good affect.  SKIN: No obvious rash, lesion, or ulcer.   LABORATORY PANEL:   CBC  Recent Labs Lab 08/01/16 1247  WBC 11.2*  HGB 12.9*  HCT 39.1*  PLT 192    ------------------------------------------------------------------------------------------------------------------  Chemistries   Recent Labs Lab 08/01/16 1247  NA 135  K 4.3  CL 99*  CO2 28  GLUCOSE 267*  BUN 31*  CREATININE 1.16  CALCIUM 8.9   ------------------------------------------------------------------------------------------------------------------  Cardiac Enzymes  Recent Labs Lab 08/01/16 1247  TROPONINI <0.03   ------------------------------------------------------------------------------------------------------------------  RADIOLOGY:  Dg Chest 2 View  Result Date: 08/01/2016 CLINICAL DATA:  76 year old male with low oxygen. Shortness of breath. Diabetes and hypertension. Initial encounter. EXAM: CHEST  2 VIEW COMPARISON:  05/29/2016 chest CT.  07/09/2015 chest x-ray. FINDINGS: Pulmonary edema superimposed upon chronic changes. Small pleural effusions. CT detected pulmonary nodules better delineated on recent CT. Post CABG.  Heart size within normal limits. Remote right rib fractures. IMPRESSION: Pulmonary edema superimposed upon chronic changes. Small pleural effusions. CT detected pulmonary nodules better delineated on recent CT. Electronically Signed   By: Genia Del M.D.   On: 08/01/2016 13:21     IMPRESSION AND PLAN:   * Acute hypoxic respiratory failure due to acute on chronic systolic CHF - IV Lasix, Beta blockers - Input and Output - Counseled to limit fluids and Salt - Monitor Bun/Cr and Potassium - Echo -Cardiology follow up after discharge  * COPD exacerbation -IV steroids, Antibiotics - Scheduled Nebulizers - Inhalers -Wean O2 as tolerated - Consult pulmonary if no improvement  * Aflutter with RVR IV lopressor x 1. Lopressor 100 mg BID from home On eliquis  * DM Lantus plus Novolog pre-meal and SSI  * DVT prophylaxis ON Eliquis   All the records are reviewed and case discussed with ED provider. Management plans discussed  with the patient, family and they are in agreement.  CODE STATUS: FULL CODE  TOTAL TIME TAKING CARE OF THIS PATIENT: 40 minutes.   Hillary Bow R M.D on 08/01/2016 at 3:51 PM  Between 7am to 6pm - Pager - [phone]  After 6pm go to [email or web address] - password [email or web address] [place] [facility]  Office  [phone]  CC: Primary care physician; Juluis Pitch, MD  Note: This dictation was prepared with Dragon dictation along with smaller phrase technology. Any transcriptional errors that result from this process are unintentional.

## 2016-08-01 NOTE — Progress Notes (Signed)
Patient admitted to room 248 from emergency room. A&o x4, VSS, no complaints of pain or shortness of breath on 4 L nasal cannula. Head to toe skin assessment completed with Tammy, RN - intact scabs/abrasions on bilateral shins, no other skin issues noted. Heart monitor verified with CCMD with Shana, Nt. Wife is at the bedside. Patient is resting comfortably at this time.

## 2016-08-01 NOTE — Progress Notes (Signed)
Advance care planning  Discussed with patient and his wife at bedside. Ernest Mclean wants his wife to be his healthcare power of attorney. He has his advance directives documented since 2010.  He tells me he does not want to be intubated or resuscitated. He does have multiple comorbidities including COPD, systolic CHF and CKD. His wife was not in agreement initially. But later after they discussed they mention to me that he will be a DO NOT RESUSCITATE and DO NOT INTUBATE. Orders entered.  Time spent 20 minutes.

## 2016-08-01 NOTE — ED Notes (Signed)
Resumed care from Ernest Mclean t rn.  Pt alert.  Pt in afib/flutter.  No chest pain.  Pt states no sob.   Pt alert.  Skin warm and dry.  meds given for heartrate   Pt waiting on admission .

## 2016-08-02 ENCOUNTER — Inpatient Hospital Stay
Admit: 2016-08-02 | Discharge: 2016-08-02 | Disposition: A | Discharge status: Home / Self Care | Payer: Medicare Other | Attending: Internal Medicine | Admitting: Internal Medicine

## 2016-08-02 LAB — BASIC METABOLIC PANEL
ANION GAP: 9 (ref 5–15)
BUN: 37 mg/dL — ABNORMAL HIGH (ref 6–20)
CALCIUM: 8.7 mg/dL — AB (ref 8.9–10.3)
CHLORIDE: 99 mmol/L — AB (ref 101–111)
CO2: 28 mmol/L (ref 22–32)
Creatinine, Ser: 1.64 mg/dL — ABNORMAL HIGH (ref 0.61–1.24)
GFR calc Af Amer: 46 mL/min — ABNORMAL LOW (ref >60)
GFR calc non Af Amer: 40 mL/min — ABNORMAL LOW (ref >60)
Glucose, Bld: 350 mg/dL — ABNORMAL HIGH (ref 65–99)
Potassium: 4 mmol/L (ref 3.5–5.1)
Sodium: 136 mmol/L (ref 135–145)

## 2016-08-02 LAB — GLUCOSE, CAPILLARY
Glucose-Capillary: 338 mg/dL — ABNORMAL HIGH (ref 65–99)
Glucose-Capillary: 358 mg/dL — ABNORMAL HIGH (ref 65–99)
Glucose-Capillary: 271 mg/dL — ABNORMAL HIGH (ref 65–99)
Glucose-Capillary: 248 mg/dL — ABNORMAL HIGH (ref 65–99)

## 2016-08-02 LAB — HEMOGLOBIN A1C
Hgb A1c MFr Bld: 8.4 % — ABNORMAL HIGH (ref 4.8–5.6)
Mean Plasma Glucose: 194 mg/dL

## 2016-08-02 LAB — CBC
HCT: 36.1 % — ABNORMAL LOW (ref 40.0–52.0)
HEMOGLOBIN: 12.1 g/dL — AB (ref 13.0–18.0)
MCH: 29.2 pg (ref 26.0–34.0)
MCHC: 33.5 g/dL (ref 32.0–36.0)
MCV: 87.2 fL (ref 80.0–100.0)
Platelets: 202 10*3/uL (ref 150–440)
RBC: 4.14 MIL/uL — AB (ref 4.40–5.90)
RDW: 14.4 % (ref 11.5–14.5)
WBC: 7.1 10*3/uL (ref 3.8–10.6)

## 2016-08-02 MED ORDER — FUROSEMIDE 10 MG/ML IJ SOLN: 40.0000 mg | Freq: Every day | INTRAMUSCULAR | Status: DC

## 2016-08-02 NOTE — Progress Notes (Signed)
[place] at [place] NAME: Ernest Mclean    MR#:  [id number]  DATE OF BIRTH:  March 01, 1941  SUBJECTIVE:   Patient came into the emergency room with increasing shortness of breath found to have pulmonary edema started on IV Lasix he has urine output of about 2100 cc feels a lot better. Uses 2 L nasal cannula oxygen at home mainly during naps and at nighttime. Wife in the room REVIEW OF SYSTEMS:   Review of Systems  Constitutional: Negative for chills, fever and weight loss.  HENT: Negative for ear discharge, ear pain and nosebleeds.   Eyes: Negative for blurred vision, pain and discharge.  Respiratory: Positive for shortness of breath. Negative for sputum production and stridor.   Cardiovascular: Positive for chest pain and orthopnea. Negative for palpitations and PND.  Gastrointestinal: Negative for abdominal pain, diarrhea, nausea and vomiting.  Genitourinary: Negative for frequency and urgency.  Musculoskeletal: Negative for back pain and joint pain.  Neurological: Negative for sensory change, speech change, focal weakness and weakness.  Psychiatric/Behavioral: Negative for depression and hallucinations. The patient is not nervous/anxious.    Tolerating Diet:yes Tolerating PT: not needed  DRUG ALLERGIES:   Allergies  Allergen Reactions  . Hydralazine Other (See Comments)    tongue swollen and couldn't wake up ---- not positive it was this or a mix of this with something else or high sugar  . Penicillins Other (See Comments)    Passed out (at 76 yrs old) Has patient had a PCN reaction causing immediate rash, facial/tongue/throat swelling, SOB or lightheadedness with hypotension: No Has patient had a PCN reaction causing severe rash involving mucus membranes or skin necrosis: No Has patient had a PCN reaction that required hospitalization No Has patient had a PCN reaction occurring within the last 10 years: No If all of the above  answers are "NO", then may proceed with Cephalosporin use.   . Codeine Hives, Rash and Swelling    VITALS:  Blood pressure 139/90, pulse 67, temperature 97.9 F (36.6 C), temperature source Oral, resp. rate 18, height 6' (1.829 m), weight 94.7 kg (208 lb 11.2 oz), SpO2 94 %.  PHYSICAL EXAMINATION:   Physical Exam  GENERAL:  76 y.o.-year-old patient lying in the bed with no acute distress.  EYES: Pupils equal, round, reactive to light and accommodation. No scleral icterus. Extraocular muscles intact.  HEENT: Head atraumatic, normocephalic. Oropharynx and nasopharynx clear.  NECK:  Supple, no jugular venous distention. No thyroid enlargement, no tenderness.  LUNGS: Normal breath sounds bilaterally, no wheezing, few rales, no rhonchi. No use of accessory muscles of respiration.  CARDIOVASCULAR: S1, S2 normal. No murmurs, rubs, or gallops.  ABDOMEN: Soft, nontender, nondistended. Bowel sounds present. No organomegaly or mass.  EXTREMITIES: No cyanosis, clubbing or edema b/l.    NEUROLOGIC: Cranial nerves II through XII are intact. No focal Motor or sensory deficits b/l.   PSYCHIATRIC:  patient is alert and oriented x 3.  SKIN: No obvious rash, lesion, or ulcer.   LABORATORY PANEL:  CBC  Recent Labs Lab 08/02/16 0339  WBC 7.1  HGB 12.1*  HCT 36.1*  PLT 202    Chemistries   Recent Labs Lab 08/01/16 1247 08/02/16 0339  NA 135 136  K 4.3 4.0  CL 99* 99*  CO2 28 28  GLUCOSE 267* 350*  BUN 31* 37*  CREATININE 1.16 1.64*  CALCIUM 8.9 8.7*  MG 2.2  --    Cardiac Enzymes  Recent  Labs Lab 08/01/16 1247  TROPONINI <0.03   RADIOLOGY:  Dg Chest 2 View  Result Date: 08/01/2016 CLINICAL DATA:  76 year old male with low oxygen. Shortness of breath. Diabetes and hypertension. Initial encounter. EXAM: CHEST  2 VIEW COMPARISON:  05/29/2016 chest CT.  07/09/2015 chest x-ray. FINDINGS: Pulmonary edema superimposed upon chronic changes. Small pleural effusions. CT detected  pulmonary nodules better delineated on recent CT. Post CABG.  Heart size within normal limits. Remote right rib fractures. IMPRESSION: Pulmonary edema superimposed upon chronic changes. Small pleural effusions. CT detected pulmonary nodules better delineated on recent CT. Electronically Signed   By: Genia Del M.D.   On: 08/01/2016 13:21   ASSESSMENT AND PLAN:   Ernest Mclean  is a 76 y.o. male with a known history of Systolic CHF, hypertension, diabetes mellitus, COPD not on home oxygen presents to the emergency room due to worsening shortness of breath, fatigue, chills and saturations at 79% on room air. Here he has been found to have chest x-ray with pulmonary edema and small bilateral pleural effusions    *Acute hypoxic respiratory failure due to acute on chronic systolic CHF - IV Lasix, Beta blockers - Input and Output - Counseled to limit fluids and Salt - Monitor Bun/Cr and Potassium - Echo today -Cardiology follow up after discharge with Dr Clayborn Bigness  * COPD exacerbation-mild - Scheduled Nebulizers - Inhalers -Wean O2 as tolerated  * Aflutter with RVR IV lopressor x 1. Lopressor 100 mg BID from home On eliquis  * DM Lantus plus Novolog pre-meal and SSI  * DVT prophylaxis ON Eliquis  Doing well If continues to improve discharge tomorrow.  Case discussed with Care Management/Social Worker. Management plans discussed with the patient, family and they are in agreement.  CODE STATUS: DNR  DVT Prophylaxis: eliquis  TOTAL TIME TAKING CARE OF THIS PATIENT: 30 minutes.  >50% time spent on counselling and coordination of care  POSSIBLE D/C IN 1-2 DAYS, DEPENDING ON CLINICAL CONDITION.  Note: This dictation was prepared with Dragon dictation along with smaller phrase technology. Any transcriptional errors that result from this process are unintentional.  Madason Rauls M.D on 08/02/2016 at 3:40 PM  Between 7am to 6pm - Pager - [phone]  After 6pm go to [email or web address]  - password [email or web address] [facility]  Office  [phone]  CC: Primary care physician; Juluis Pitch, MD

## 2016-08-02 NOTE — Plan of Care (Signed)
Problem: Activity: Goal: Risk for activity intolerance will decrease Outcome: Progressing Patient ambulating to the bathroom and in the room independently, tolerating well. No complaints of shortness of breath with exertion.

## 2016-08-02 NOTE — Plan of Care (Signed)
Problem: Pain Managment: Goal: General experience of comfort will improve Outcome: Progressing Patient complains of a sore throat that initiated after be admiting to the unit. Administered tylenol but patient has minimal relief.

## 2016-08-02 NOTE — Progress Notes (Signed)
Inpatient Diabetes Program Recommendations  AACE/ADA: New Consensus Statement on Inpatient Glycemic Control (2015)  Target Ranges:  Prepandial:   less than 140 mg/dL      Peak postprandial:   less than 180 mg/dL (1-2 hours)      Critically ill patients:  140 - 180 mg/dL   Results for Gamero, WINDLE HUEBERT (MRN [id number]) as of 08/02/2016 09:15  Ref. Range 08/01/2016 17:19 08/01/2016 20:52 08/02/2016 07:50  Glucose-Capillary Latest Ref Range: 65 - 99 mg/dL 333 (H) 450 (H) 338 (H)   Review of Glycemic Control  Diabetes history: DM2 Outpatient Diabetes medications: Lantus 60 units QHS, Humalog 12 units with breakfast, Humalog 18 units with lunch, Humalog 23 units with supper, plus additional Humalog per correction scale, Metformin 1000 mg QPM Current orders for Inpatient glycemic control: Lantus 60 units QHS, Novolog 12 units TID with meals, Novolog 0-15 units ACHS, Metformin 1000 mg QPM  Inpatient Diabetes Program Recommendations: Insulin - Basal: Please consider increasing Lantus to 65 units QHS.  Thanks, Barnie Alderman, RN, MSN, CDE Diabetes Coordinator Inpatient Diabetes Program [phone] (Team Pager from 8am to 5pm)

## 2016-08-02 NOTE — Progress Notes (Signed)
*  PRELIMINARY RESULTS* Echocardiogram 2D Echocardiogram has been performed.  Sherrie Sport 08/02/2016, 3:17 PM

## 2016-08-03 LAB — GLUCOSE, CAPILLARY
GLUCOSE-CAPILLARY: 290 mg/dL — AB (ref 65–99)
Glucose-Capillary: 305 mg/dL — ABNORMAL HIGH (ref 65–99)

## 2016-08-03 LAB — ECHOCARDIOGRAM COMPLETE
HEIGHTINCHES: 72 in
WEIGHTICAEL: 3339.2 [oz_av]

## 2016-08-03 MED ORDER — LANTUS 100 UNIT/ML ~~LOC~~ SOLN: 60.0000 [IU] | Freq: Every day | SUBCUTANEOUS | 1 refills | Status: DC

## 2016-08-03 MED ORDER — FUROSEMIDE 40 MG PO TABS: 40.0000 mg | ORAL_TABLET | Freq: Every day | ORAL | Status: DC

## 2016-08-03 MED ORDER — FUROSEMIDE 20 MG PO TABS
20.0000 mg | ORAL_TABLET | Freq: Every day | ORAL | Status: DC
  Administered 2016-08-03: 20 mg via ORAL
  Filled 2016-08-03: qty 1

## 2016-08-03 NOTE — Discharge Instructions (Signed)
Heart Failure Clinic appointment on August 10, 2016 at 8:40am with Darylene Price, [place]. Please call [phone] to reschedule.   Use your oxygen as before

## 2016-08-03 NOTE — Care Management (Signed)
Admitted from home with heart failure.  Lives with his very supportive wife.  Is followed by [facility] for daily weight reports and the HF clinic.  Patient and his wife state patient is not homebound, therefore would not be eligible for home health nurse.  Independent in all adls, denies issues accessing medical care, obtaining medications or with transportation.  Current with PCP.  No discharge needs identified at present by care manager or members of care team

## 2016-08-03 NOTE — Progress Notes (Signed)
SATURATION QUALIFICATIONS: (This note is used to comply with regulatory documentation for home oxygen)  Patient Saturations on Room Air at Rest = 96%  Patient Saturations on Room Air while Ambulating = 89%  Patient Saturations on 2 Liters of oxygen while Ambulating = 98%  Please briefly explain why patient needs home oxygen: Patient does not qualify for O2 with walking for 10 minute- wife does say that he needs it for when he exercises. According to this info- he does not qualify. Pasty Spillers

## 2016-08-03 NOTE — Discharge Summary (Signed)
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 Atrial fibrillation ([place])   . CAD (coronary artery disease)   . CKD (chronic kidney disease)   . Congestive heart failure ([place])   . COPD (chronic obstructive pulmonary disease) ([place])   . Diabetes ([place])   . Hypertension   . OSA on CPAP     HOSPITAL COURSE:   LarryRingis a 76 y.o.malewith a known history of Systolic CHF, hypertension, diabetes mellitus, COPD not on home oxygen presents to the emergency room due to worsening shortness of breath, fatigue, chills and saturations at 79% on room air. Here he has been found to have chest x-ray with pulmonary edema and small bilateral pleural effusions    *Acute hypoxic respiratory failure due to acute on chronic systolic CHF - IV Lasix, Beta blockers - uop 2900 cc. Doing well. Feels at baseline - Counseled to limit fluids and Salt - Echo done-results pendig -Cardiology follow up after discharge with Dr Ernest Mclean  * COPD exacerbation-mild - Scheduled Nebulizers - Inhalers -Wean O2 as tolerated  * Aflutter with RVR IV lopressor x 1. Lopressor 100 mg BID from home On eliquis  * DM Lantus plus Novolog pre-meal and SSI  * DVT prophylaxis ON Eliquis  D/c today Spoke with pt and wife CONSULTS OBTAINED:    DRUG ALLERGIES:   Allergies  Allergen Reactions  . Hydralazine Other  (See Comments)    tongue swollen and couldn't wake up ---- not positive it was this or a mix of this with something else or high sugar  . Penicillins Other (See Comments)    Passed out (at 76 yrs old) Has patient had a PCN reaction causing immediate rash, facial/tongue/throat swelling, SOB or lightheadedness with hypotension: No Has patient had a PCN reaction causing severe rash involving mucus membranes or skin necrosis: No Has patient had a PCN reaction that required hospitalization No Has patient had a PCN reaction occurring within the last 10 years: No If all of the above answers are "NO", then may proceed with Cephalosporin use.   . Codeine Hives, Rash and Swelling    DISCHARGE MEDICATIONS:   Current Discharge Medication List    CONTINUE these medications which have CHANGED   Details  LANTUS 100 UNIT/ML injection Inject 0.6 mLs (60 Units total) into the skin at bedtime. Qty: 1 vial, Refills: 1      CONTINUE these medications which have NOT CHANGED   Details  albuterol (PROVENTIL HFA;VENTOLIN HFA) 108 (90 BASE) MCG/ACT inhaler Inhale 2 puffs into the lungs every 4 (four) hours as needed for wheezing or shortness of breath. Qty: 1 Inhaler, Refills: 2    amLODipine (NORVASC) 5 MG tablet Take 5 mg by mouth daily.     apixaban (ELIQUIS) 5 MG TABS tablet Take 5 mg by mouth 2 (two) times daily.    aspirin EC 81 MG tablet Take 81 mg by mouth daily.    citalopram (CELEXA)  20 MG tablet Take 20 mg by mouth at bedtime.  Refills: 5    CRANBERRY PO Take 820 mg by mouth daily.    cyanocobalamin 500 MCG tablet Take 500 mcg by mouth daily.    desonide (DESOWEN) 0.05 % cream Apply 1 application topically 2 (two) times daily.    diazepam (VALIUM) 5 MG tablet Take 5 mg by mouth every 12 (twelve) hours as needed for anxiety.     Fluocinolone Acetonide 0.01 % OIL Place 2 drops into both ears 2 (two) times daily.    Fluticasone-Salmeterol (ADVAIR DISKUS) 500-50 MCG/DOSE AEPB Inhale 1 puff  into the lungs 2 (two) times daily. Qty: 60 each, Refills: 5    furosemide (LASIX) 20 MG tablet Take 20 mg by mouth daily. Take one tablet daily.  Can take extra tablet in afternoon if increase weight gain of 3 pounds or increased swelling.    insulin lispro (HUMALOG) 100 UNIT/ML injection Inject 12-23 Units into the skin 3 (three) times daily before meals. 12 units before breakfast, 18 units before lunch, and 23 units before supper plus additional units for sliding scale    ketoconazole (NIZORAL) 2 % shampoo Apply 1 application topically 2 (two) times a week.    lisinopril (PRINIVIL,ZESTRIL) 5 MG tablet Take 5 mg by mouth daily.    metFORMIN (GLUCOPHAGE) 500 MG tablet Take 1,000 mg by mouth every evening.     metoprolol (LOPRESSOR) 100 MG tablet Take 100 mg by mouth 2 (two) times daily. Refills: 0    OMEGA-3 FATTY ACIDS-VITAMIN E PO Take 1 capsule by mouth daily.    omeprazole (PRILOSEC) 20 MG capsule Take 20 mg by mouth daily. Refills: 3    pravastatin (PRAVACHOL) 20 MG tablet Take 20 mg by mouth at bedtime. Refills: 3    tiotropium (SPIRIVA) 18 MCG inhalation capsule Place 18 mcg into inhaler and inhale daily.        If you experience worsening of your admission symptoms, develop shortness of breath, life threatening emergency, suicidal or homicidal thoughts you must seek medical attention immediately by calling 911 or calling your MD immediately  if symptoms less severe.  You Must read complete instructions/literature along with all the possible adverse reactions/side effects for all the Medicines you take and that have been prescribed to you. Take any new Medicines after you have completely understood and accept all the possible adverse reactions/side effects.   Please note  You were cared for by a hospitalist during your hospital stay. If you have any questions about your discharge medications or the care you received while you were in the hospital after you are discharged,  you can call the unit and asked to speak with the hospitalist on call if the hospitalist that took care of you is not available. Once you are discharged, your primary care physician will handle any further medical issues. Please note that NO REFILLS for any discharge medications will be authorized once you are discharged, as it is imperative that you return to your primary care physician (or establish a relationship with a primary care physician if you do not have one) for your aftercare needs so that they can reassess your need for medications and monitor your lab values. Today   SUBJECTIVE   Doing well  VITAL SIGNS:  Blood pressure (!) 149/60, pulse 67, temperature 97.4 F (36.3 C), temperature source Oral, resp. rate 18, height 6' (1.829 m), weight 94.6 kg (208 lb 8 oz), SpO2 96 %.  I/O:  Intake/Output Summary (Last 24 hours) at 08/03/16 0857 Last data filed at 08/02/16 1919  Gross per 24 hour  Intake              960 ml  Output             1000 ml  Net              -40 ml    PHYSICAL EXAMINATION:  GENERAL:  76 y.o.-year-old patient lying in the bed with no acute distress.  EYES: Pupils equal, round, reactive to light and accommodation. No scleral icterus. Extraocular muscles intact.  HEENT: Head atraumatic, normocephalic. Oropharynx and nasopharynx clear.  NECK:  Supple, no jugular venous distention. No thyroid enlargement, no tenderness.  LUNGS: distant bs breath sounds bilaterally, no wheezing, rales,rhonchi or crepitation. No use of accessory muscles of respiration.  CARDIOVASCULAR: S1, S2 normal. No murmurs, rubs, or gallops. irregular ABDOMEN: Soft, non-tender, non-distended. Bowel sounds present. No organomegaly or mass.  EXTREMITIES: No pedal edema, cyanosis, or clubbing.  NEUROLOGIC: Cranial nerves II through XII are intact. Muscle strength 5/5 in all extremities. Sensation intact. Gait not checked.  PSYCHIATRIC: The patient is alert and oriented x 3.  SKIN: No obvious  rash, lesion, or ulcer.   DATA REVIEW:   CBC   Recent Labs Lab 08/02/16 0339  WBC 7.1  HGB 12.1*  HCT 36.1*  PLT 202    Chemistries   Recent Labs Lab 08/01/16 1247 08/02/16 0339  NA 135 136  K 4.3 4.0  CL 99* 99*  CO2 28 28  GLUCOSE 267* 350*  BUN 31* 37*  CREATININE 1.16 1.64*  CALCIUM 8.9 8.7*  MG 2.2  --     Microbiology Results   No results found for this or any previous visit (from the past 240 hour(s)).  RADIOLOGY:  Dg Chest 2 View  Result Date: 08/01/2016 CLINICAL DATA:  76 year old male with low oxygen. Shortness of breath. Diabetes and hypertension. Initial encounter. EXAM: CHEST  2 VIEW COMPARISON:  05/29/2016 chest CT.  07/09/2015 chest x-ray. FINDINGS: Pulmonary edema superimposed upon chronic changes. Small pleural effusions. CT detected pulmonary nodules better delineated on recent CT. Post CABG.  Heart size within normal limits. Remote right rib fractures. IMPRESSION: Pulmonary edema superimposed upon chronic changes. Small pleural effusions. CT detected pulmonary nodules better delineated on recent CT. Electronically Signed   By: Genia Del M.D.   On: 08/01/2016 13:21     Management plans discussed with the patient, family and they are in agreement.  CODE STATUS:     Code Status Orders        Start     Ordered   08/01/16 1646  Do not attempt resuscitation (DNR)  Continuous    Question Answer Comment  In the event of cardiac or respiratory ARREST Do not call a "code blue"   In the event of cardiac or respiratory ARREST Do not perform Intubation, CPR, defibrillation or ACLS   In the event of cardiac or respiratory ARREST Use medication by any route, position, wound care, and other measures to relive pain and suffering. May use oxygen, suction and manual treatment of airway obstruction as needed for comfort.      08/01/16 1645    Code Status History    Date Active Date Inactive Code Status Order ID Comments User Context   08/01/2016  3:48  PM 08/01/2016  4:45 PM Full Code [id number]  Hillary Bow, MD ED   07/09/2015  4:10 PM 07/11/2015  6:40 PM Full Code [id number]  Gladstone Lighter, MD ED   05/13/2015 12:50 AM 05/14/2015  5:20 PM Full Code [id number]  Lance Coon, MD ED      TOTAL TIME TAKING CARE OF THIS PATIENT: 40 minutes.    Kees Idrovo M.D on 08/03/2016 at 8:57 AM  Between 7am to 6pm - Pager - [phone] After 6pm go to [email or web address] - password [email or web address] [facility] [place] [facility]  Office  [phone]  CC: Primary care physician; Juluis Pitch, MD

## 2016-08-10 ENCOUNTER — Ambulatory Visit: Payer: Medicare Other | Admitting: Family

## 2016-08-17 ENCOUNTER — Ambulatory Visit: Payer: Medicare Other | Attending: Family | Admitting: Family

## 2016-08-17 ENCOUNTER — Encounter: Payer: Self-pay | Admitting: Family

## 2016-08-17 VITALS — BP 124/60 | HR 65 | Resp 18 | Ht 72.0 in | Wt 210.0 lb

## 2016-08-17 DIAGNOSIS — Z79899 Other long term (current) drug therapy: Secondary | ICD-10-CM | POA: Insufficient documentation

## 2016-08-17 DIAGNOSIS — Z888 Allergy status to other drugs, medicaments and biological substances status: Secondary | ICD-10-CM | POA: Diagnosis not present

## 2016-08-17 DIAGNOSIS — I130 Hypertensive heart and chronic kidney disease with heart failure and stage 1 through stage 4 chronic kidney disease, or unspecified chronic kidney disease: Secondary | ICD-10-CM | POA: Insufficient documentation

## 2016-08-17 DIAGNOSIS — I10 Essential (primary) hypertension: Secondary | ICD-10-CM

## 2016-08-17 DIAGNOSIS — N189 Chronic kidney disease, unspecified: Secondary | ICD-10-CM | POA: Insufficient documentation

## 2016-08-17 DIAGNOSIS — Z794 Long term (current) use of insulin: Secondary | ICD-10-CM | POA: Insufficient documentation

## 2016-08-17 DIAGNOSIS — Z7982 Long term (current) use of aspirin: Secondary | ICD-10-CM | POA: Insufficient documentation

## 2016-08-17 DIAGNOSIS — I2510 Atherosclerotic heart disease of native coronary artery without angina pectoris: Secondary | ICD-10-CM | POA: Insufficient documentation

## 2016-08-17 DIAGNOSIS — E1122 Type 2 diabetes mellitus with diabetic chronic kidney disease: Secondary | ICD-10-CM | POA: Diagnosis not present

## 2016-08-17 DIAGNOSIS — N183 Chronic kidney disease, stage 3 (moderate): Secondary | ICD-10-CM

## 2016-08-17 DIAGNOSIS — I4891 Unspecified atrial fibrillation: Secondary | ICD-10-CM | POA: Diagnosis not present

## 2016-08-17 DIAGNOSIS — Z7951 Long term (current) use of inhaled steroids: Secondary | ICD-10-CM | POA: Diagnosis not present

## 2016-08-17 DIAGNOSIS — Z87891 Personal history of nicotine dependence: Secondary | ICD-10-CM | POA: Insufficient documentation

## 2016-08-17 DIAGNOSIS — I5032 Chronic diastolic (congestive) heart failure: Secondary | ICD-10-CM | POA: Diagnosis present

## 2016-08-17 DIAGNOSIS — G4733 Obstructive sleep apnea (adult) (pediatric): Secondary | ICD-10-CM | POA: Insufficient documentation

## 2016-08-17 DIAGNOSIS — E1151 Type 2 diabetes mellitus with diabetic peripheral angiopathy without gangrene: Secondary | ICD-10-CM | POA: Insufficient documentation

## 2016-08-17 DIAGNOSIS — J439 Emphysema, unspecified: Secondary | ICD-10-CM

## 2016-08-17 DIAGNOSIS — J449 Chronic obstructive pulmonary disease, unspecified: Secondary | ICD-10-CM | POA: Insufficient documentation

## 2016-08-17 DIAGNOSIS — Z880 Allergy status to penicillin: Secondary | ICD-10-CM | POA: Diagnosis not present

## 2016-08-17 DIAGNOSIS — F419 Anxiety disorder, unspecified: Secondary | ICD-10-CM | POA: Insufficient documentation

## 2016-08-17 DIAGNOSIS — Z885 Allergy status to narcotic agent status: Secondary | ICD-10-CM | POA: Diagnosis not present

## 2016-08-17 DIAGNOSIS — Z9989 Dependence on other enabling machines and devices: Secondary | ICD-10-CM

## 2016-08-17 NOTE — Patient Instructions (Signed)
Continue weighing daily and call for an overnight weight gain of > 2 pounds or a weekly weight gain of >5 pounds. 

## 2016-08-17 NOTE — Progress Notes (Signed)
Patient ID: Ernest Mclean, male    DOB: [birth year], 76 y.o.   MRN: [id number]  HPI  Mr Halbert is a 76 y/o male with a history of Atrial fibrillation, CAD, CKD, COPD, diabetes, HTN, PVD, PAD, obstructive sleep apnea (on CPAP), remote tobacco use and chronic HF with preserved EF.  Recent echo done 08/02/16 and showed an EF of 60-65% along with mild MR/TR. Previous echo was done 07/09/15 which showed an EF of 45-50% with trivial MR. No aortic stenosis. EF is down from June 2016 when it was >55%. Last PFT's done September 2016 with recent 6 minute walk test completed by pulmonology on 02/02/16.   Admitted 08/01/16 with HF/ COPD exacerbation. Initially given IV diuretics and cardiology consult was done. Scheduled nebulizers and inhalers were also given. Needed one dose of IV lopressor due to aflutter with RVR. Discharged home after 2 days. Previous hospital admission was 07/09/15 with acute HF exacerbation. Was diuresed with IV Lasix, given antibiotics for possible pneumonia and was qualified for around the clock oxygen use.   He presents today for a follow-up visit with fatigue with moderate exertion. He says that he can walk about 8 minutes before he has to stop and rest due to fatigue but also pain in his right calf. No edema in ankles/abdomen. Does have some shortness of breath along with a raspy sounding voice. Continuing to weigh daily and reports a stable weight recently at home. Wearing oxygen at 2L at bedtime and daytime naps if taken. Wears CPAP nightly. Continues to have issues with anxiety in crowds, large family gatherings etc. Has been taking his diuretic every other day.   Past Medical History:  Diagnosis Date  . Atrial fibrillation ([place])   . CAD (coronary artery disease)   . CKD (chronic kidney disease)   . Congestive heart failure ([place])   . COPD (chronic obstructive pulmonary disease) ([place])   . Diabetes ([place])   . Hypertension   . OSA on CPAP    Past Surgical History:  Procedure  Laterality Date  . CLAVICLE SURGERY    . heart bypass    . HERNIA REPAIR     Family History  Problem Relation Age of Onset  . Stroke    . Diabetes    . Breast cancer    . Colon cancer     Social History  Substance Use Topics  . Smoking status: Former Smoker    Packs/day: 1.00    Years: 30.00    Types: Cigarettes  . Smokeless tobacco: Never Used     Comment: quit 12/14/2001  . Alcohol use No   Allergies  Allergen Reactions  . Hydralazine Other (See Comments)    tongue swollen and couldn't wake up ---- not positive it was this or a mix of this with something else or high sugar  . Penicillins Other (See Comments)    Passed out (at 76 yrs old) Has patient had a PCN reaction causing immediate rash, facial/tongue/throat swelling, SOB or lightheadedness with hypotension: No Has patient had a PCN reaction causing severe rash involving mucus membranes or skin necrosis: No Has patient had a PCN reaction that required hospitalization No Has patient had a PCN reaction occurring within the last 10 years: No If all of the above answers are "NO", then may proceed with Cephalosporin use.   . Codeine Hives, Rash and Swelling   Prior to Admission medications   Medication Sig Start Date End Date Taking? Authorizing Provider  albuterol (PROVENTIL  HFA;VENTOLIN HFA) 108 (90 BASE) MCG/ACT inhaler Inhale 2 puffs into the lungs every 4 (four) hours as needed for wheezing or shortness of breath. 10/29/14  Yes Vishal Mungal, MD  amLODipine (NORVASC) 5 MG tablet Take 5 mg by mouth daily.  08/17/14  Yes Historical Provider, MD  apixaban (ELIQUIS) 5 MG TABS tablet Take 5 mg by mouth 2 (two) times daily. 08/17/14  Yes Historical Provider, MD  aspirin EC 81 MG tablet Take 81 mg by mouth daily. 08/17/14  Yes Historical Provider, MD  citalopram (CELEXA) 20 MG tablet Take 20 mg by mouth at bedtime.  07/30/14  Yes Historical Provider, MD  CRANBERRY PO Take 820 mg by mouth daily.   Yes Historical Provider, MD   cyanocobalamin 500 MCG tablet Take 500 mcg by mouth daily.   Yes Historical Provider, MD  desonide (DESOWEN) 0.05 % cream Apply 1 application topically 2 (two) times daily. 05/10/16  Yes Historical Provider, MD  diazepam (VALIUM) 5 MG tablet Take 5 mg by mouth every 12 (twelve) hours as needed for anxiety.  10/24/13  Yes Historical Provider, MD  Fluocinolone Acetonide 0.01 % OIL Place 2 drops into both ears 2 (two) times daily. 05/10/16  Yes Historical Provider, MD  Fluticasone-Salmeterol (ADVAIR DISKUS) 500-50 MCG/DOSE AEPB Inhale 1 puff into the lungs 2 (two) times daily. 01/12/16  Yes Vishal Mungal, MD  furosemide (LASIX) 20 MG tablet Take 20 mg by mouth daily. Take one tablet daily.  Can take extra tablet in afternoon if increase weight gain of 3 pounds or increased swelling.   Yes Historical Provider, MD  insulin lispro (HUMALOG) 100 UNIT/ML injection Inject 12-23 Units into the skin 3 (three) times daily before meals. 12 units before breakfast, 18 units before lunch, and 23 units before supper plus additional units for sliding scale   Yes Historical Provider, MD  ketoconazole (NIZORAL) 2 % shampoo Apply 1 application topically 2 (two) times a week. 05/11/16  Yes Historical Provider, MD  LANTUS 100 UNIT/ML injection Inject 0.6 mLs (60 Units total) into the skin at bedtime. 08/03/16  Yes Fritzi Mandes, MD  lisinopril (PRINIVIL,ZESTRIL) 5 MG tablet Take 5 mg by mouth daily.   Yes Historical Provider, MD  metFORMIN (GLUCOPHAGE) 500 MG tablet Take 1,000 mg by mouth every evening.    Yes Historical Provider, MD  metoprolol (LOPRESSOR) 100 MG tablet Take 100 mg by mouth 2 (two) times daily. 06/17/14  Yes Historical Provider, MD  OMEGA-3 FATTY ACIDS-VITAMIN E PO Take 1 capsule by mouth daily.   Yes Historical Provider, MD  omeprazole (PRILOSEC) 20 MG capsule Take 20 mg by mouth daily. 08/07/14  Yes Historical Provider, MD  pravastatin (PRAVACHOL) 20 MG tablet Take 20 mg by mouth at bedtime. 07/31/14  Yes  Historical Provider, MD  tiotropium (SPIRIVA) 18 MCG inhalation capsule Place 18 mcg into inhaler and inhale daily. 07/31/14  Yes Historical Provider, MD     Review of Systems  Constitutional: Positive for fatigue. Negative for appetite change.  HENT: Positive for voice change (raspy voice). Negative for congestion and postnasal drip.   Eyes: Negative.   Respiratory: Positive for cough and shortness of breath. Negative for chest tightness.   Cardiovascular: Negative for chest pain, palpitations and leg swelling.  Gastrointestinal: Negative for abdominal distention and abdominal pain.  Endocrine: Negative.   Genitourinary: Negative.   Musculoskeletal: Negative for back pain and neck pain.  Skin: Negative.   Allergic/Immunologic: Negative.   Neurological: Negative for dizziness and light-headedness.  Hematological: Negative for  adenopathy. Does not bruise/bleed easily.  Psychiatric/Behavioral: Negative for dysphoric mood and sleep disturbance (wearing oxygen and CPAP at bedtime). The patient is nervous/anxious.    Vitals:   08/17/16 0901  BP: 124/60  Pulse: 65  Resp: 18  SpO2: 98%  Weight: 210 lb (95.3 kg)  Height: 6' (1.829 m)   Wt Readings from Last 3 Encounters:  08/17/16 210 lb (95.3 kg)  08/03/16 208 lb 8 oz (94.6 kg)  06/01/16 219 lb (99.3 kg)   Lab Results  Component Value Date   CREATININE 1.64 (H) 08/02/2016   CREATININE 1.16 08/01/2016   CREATININE 1.76 (H) 05/24/2016    Physical Exam  Constitutional: He is oriented to person, place, and time. He appears well-developed and well-nourished.  HENT:  Head: Normocephalic and atraumatic.  Eyes: Conjunctivae are normal. Pupils are equal, round, and reactive to light.  Neck: Normal range of motion. Neck supple. No JVD present.  Cardiovascular: Normal rate and regular rhythm.   Pulmonary/Chest: Effort normal. He has no wheezes. He has no rales.  Abdominal: Soft. He exhibits no distension. There is no tenderness.   Musculoskeletal: He exhibits no edema or tenderness.  Neurological: He is alert and oriented to person, place, and time.  Skin: Skin is warm and dry.  Psychiatric: He has a normal mood and affect. His behavior is normal. Thought content normal.  Nursing note and vitals reviewed.     Assessment & Plan:  1: Chronic heart failure with preserved ejection fraction- - NYHA Class II - Euvolemic today - Continue daily weights with taking additional furosemide for overnight weight gain of >2 pounds/weekly weight gain of >5 pounds - Continue daily walking with rest as needed for calf pain - has been taking his diuretic every other day and his wife notices that when his voice gets raspy that it's usually because fluid is starting to accumulate - advised him to take his diuretic daily for a few days and see if the raspiness in his voice improves - Last saw cardiology 10/13/15 with note to be seen in one year  2: HTN- -  BP looks great today - Saw PCP (Bronstein) on 05/29/16. Returns July 2018.  3: Obstructive sleep apnea- - Wearing CPAP on a nightly basis - Wears oxygen at 2L QHS  4: Diabetes- - glucose this morning was 99 - saw endocrinologist (Solum) 07/11/16  5: COPD- - Saw pulmonologist (Kasa) 06/01/16 - Using inhalers & oxygen  6: Anxiety- - Patient & wife both endorse worsening anxiety over the last few years and especially over the last few months - Worse in crowds with lots of talking/noise and at funerals - remains hesitant about taking diazepam because he doesn't want to get addicted. Discussed how stress can affect the heart and that he could try breaking it in half to see if that's enough to help decrease his anxiety  Patient did not bring his medications nor a list. Each medication was verbally reviewed with the patient and he was encouraged to bring the bottles to every visit to confirm accuracy of list.  Return in 3 months or sooner for any questions/problems before then.

## 2016-08-30 ENCOUNTER — Ambulatory Visit: Payer: Medicare Other | Admitting: Family

## 2016-09-01 ENCOUNTER — Encounter: Payer: Self-pay | Admitting: Internal Medicine

## 2016-09-01 ENCOUNTER — Ambulatory Visit (INDEPENDENT_AMBULATORY_CARE_PROVIDER_SITE_OTHER): Payer: Medicare Other | Admitting: Internal Medicine

## 2016-09-01 VITALS — BP 122/64 | HR 61 | Wt 212.0 lb

## 2016-09-01 DIAGNOSIS — J9611 Chronic respiratory failure with hypoxia: Secondary | ICD-10-CM

## 2016-09-01 NOTE — Patient Instructions (Signed)
Continue CPAP therapy as prescribed Continue inhalers as precribed

## 2016-09-01 NOTE — Progress Notes (Signed)
[facility] Consultation      MRN# [id number] Ernest Mclean 07/24/1940   CC: No chief complaint on file.     Brief History: 76 yo male with COPD, PVD with peripheral claudication, OSA  worst after PNA in 07/2014, now following for COPD and Chronic Hypoxic respiratory failure on 2L o2 (maily for PVD and at night for OSA). Pulmonary nodules on CT, sub cm, mainly due to inflammatory/infectious process.     Events since last clinic visit: Patient presents today for follow-up visit of his COPD and chronic hypoxic respiratory failure/OSA/nodules At his last visit he's noted to be on 2 L of supplemental oxygen at night  Doing incentive spirometer 20 times daily averaging about 2000cc.   follow up CT Chest-, showed some inflammatory nodules consistent with his recent infection and has completely resolved  No acute issues at this time No signs of infection at this time Doing well overall.   Recent admission for acute Afib with RVR and CHF exacerbation doing well now on lasix therapy   Medication:   Current Outpatient Prescriptions:  .  albuterol (PROVENTIL HFA;VENTOLIN HFA) 108 (90 BASE) MCG/ACT inhaler, Inhale 2 puffs into the lungs every 4 (four) hours as needed for wheezing or shortness of breath., Disp: 1 Inhaler, Rfl: 2 .  amLODipine (NORVASC) 5 MG tablet, Take 5 mg by mouth daily. , Disp: , Rfl:  .  apixaban (ELIQUIS) 5 MG TABS tablet, Take 5 mg by mouth 2 (two) times daily., Disp: , Rfl:  .  aspirin EC 81 MG tablet, Take 81 mg by mouth daily., Disp: , Rfl:  .  citalopram (CELEXA) 20 MG tablet, Take 20 mg by mouth at bedtime. , Disp: , Rfl: 5 .  CRANBERRY PO, Take 820 mg by mouth daily., Disp: , Rfl:  .  cyanocobalamin 500 MCG tablet, Take 500 mcg by mouth daily., Disp: , Rfl:  .  desonide (DESOWEN) 0.05 % cream, Apply 1 application topically 2 (two) times daily., Disp: , Rfl:  .  diazepam (VALIUM) 5 MG tablet, Take 5 mg by mouth every 12 (twelve) hours as  needed for anxiety. , Disp: , Rfl:  .  Fluocinolone Acetonide 0.01 % OIL, Place 2 drops into both ears 2 (two) times daily., Disp: , Rfl:  .  Fluticasone-Salmeterol (ADVAIR DISKUS) 500-50 MCG/DOSE AEPB, Inhale 1 puff into the lungs 2 (two) times daily., Disp: 60 each, Rfl: 5 .  furosemide (LASIX) 20 MG tablet, Take 20 mg by mouth daily. Take one tablet daily.  Can take extra tablet in afternoon if increase weight gain of 3 pounds or increased swelling., Disp: , Rfl:  .  insulin lispro (HUMALOG) 100 UNIT/ML injection, Inject 12-23 Units into the skin 3 (three) times daily before meals. 12 units before breakfast, 18 units before lunch, and 23 units before supper plus additional units for sliding scale, Disp: , Rfl:  .  ketoconazole (NIZORAL) 2 % shampoo, Apply 1 application topically 2 (two) times a week., Disp: , Rfl:  .  LANTUS 100 UNIT/ML injection, Inject 0.6 mLs (60 Units total) into the skin at bedtime., Disp: 1 vial, Rfl: 1 .  lisinopril (PRINIVIL,ZESTRIL) 5 MG tablet, Take 5 mg by mouth daily., Disp: , Rfl:  .  metFORMIN (GLUCOPHAGE) 500 MG tablet, Take 1,000 mg by mouth every evening. , Disp: , Rfl:  .  metoprolol (LOPRESSOR) 100 MG tablet, Take 100 mg by mouth 2 (two) times daily., Disp: , Rfl: 0 .  OMEGA-3 FATTY  ACIDS-VITAMIN E PO, Take 1 capsule by mouth daily., Disp: , Rfl:  .  omeprazole (PRILOSEC) 20 MG capsule, Take 20 mg by mouth daily., Disp: , Rfl: 3 .  pravastatin (PRAVACHOL) 20 MG tablet, Take 20 mg by mouth at bedtime., Disp: , Rfl: 3 .  tiotropium (SPIRIVA) 18 MCG inhalation capsule, Place 18 mcg into inhaler and inhale daily., Disp: , Rfl:     Review of Systems  Constitutional: Negative for chills, fever and weight loss.  HENT: Negative for hearing loss.   Eyes: Negative for blurred vision.  Respiratory: Positive for shortness of breath. Negative for cough, sputum production and wheezing.        Improving SOB, now mostly with exertion  Cardiovascular: Positive for  claudication. Negative for chest pain, palpitations and orthopnea.  Gastrointestinal: Negative for heartburn and nausea.  Genitourinary: Negative for dysuria.  Musculoskeletal: Positive for myalgias.       Chronic leg pain with exercise or exertion, has PVD/PAD  Skin: Negative for rash.  Neurological: Negative for headaches.      Allergies:  Hydralazine; Penicillins; and Codeine  Physical Examination:  BP 122/64 (BP Location: Left Arm, Cuff Size: Normal)   Pulse 61   Wt 212 lb (96.2 kg)   SpO2 97%   BMI 28.75 kg/m   General Appearance: No distress  HEENT: PERRLA, no ptosis, no other lesions noticed Pulmonary:normal breath sounds., diaphragmatic excursion normal.No wheezing, No rales   Cardiovascular:  Normal S1,S2.  No m/r/g.     Abdomen:Exam: Benign, Soft, non-tender, No masses  Skin:   warm, no rashes, no ecchymosis  Extremities: normal, no cyanosis, clubbing, warm with normal capillary refill.       6MWT - 02/10/15 - distance: 276m - lowest saturation: 97% - no complaints during walk, no need for supplemental O2 with exertion  6MWT 09/20/2015 - distance 1153ft - lowest sat  95% - History of peripheral vascular disease and claudication, completed 8.5 minutes of 10 minutes of testing, stopped early due to leg pain  PFTs 02/10/15 postBD FEV1 75% postBD FVC 70% FEV1/FVC 77% RV 147 TLC 126 RV/TLC 110 DLCO - unable toperform Impression - decrease FEV1, preserved FEV1/FVC ratio. Mild/Mod obstruction on inhalers, air trapping/hyperinflation noted.  Significant response to BD, >216ml on FVC and FEV1 post BD   EADIOLOGY: (The following images and results were reviewed by Dr. Stevenson Clinch on 09/01/2016). CT Chest 02/15/16 IMPRESSION:CT chest 01/2016 1. Several new pulmonary nodules since the prior chest CT in June 2016. 2. Patchy area of tree-in-bud appearance in the right upper lobe is likely inflammation or atypical infection. 3. Probable area of rounded atelectasis in the  left lower lobe. 4. Stable emphysematous changes. 5. Advanced atherosclerotic calcifications involving the thoracic and abdominal aorta and branch vessels. 6. Scattered mediastinal and hilar lymph nodes. 7. I do not think PET-CT would be helpful as these nodules are too small. Abdominal/pelvic CT may be helpful to exclude malignancy. If this is negative a three-month follow-up chest CT is suggested.  Repeat CT chest 05/2016 IMPRESSION: 1. Scattered small pulmonary nodules, regarded as new on the prior exam, are smaller or resolved on the current exam. 2. Resolved subpleural consolidation/ground-glass in the left lower lobe with residual scarring. 3. Additional pulmonary nodules are unchanged from 07/15/2014 and considered benign. 4. Aortic atherosclerosis (ICD10-170.0). Coronary artery calcification. 5. Cholelithiasis.   Assessment and Plan: 76 year old male presents for follow-up visit of Mod COPD gold stage C with CKD,CHF with OSA and chronic hypoxic resp failure,  Solitary pulmonary nodule previous nodules after recent infection have resolved   OSA on CPAP Patient with a known history of OSA on CPAP.10 cm h20  Overnight pulse oximetry showed multiple events (80) occurring between 89 and 85% saturation. Continue with 2 L supplemental oxygen on CPAP.   Plan: -Continue with supplemental oxygen (2 L) with CPAP at night -CPAP compliance discussed with patient, overall doing well.    COPD (chronic obstructive pulmonary disease) (HCC) Overall fairly stable,   01/2015 PFTs  - preserved FEV1/FVC ratio, airtrapping and hyperinflation, significant response to BD.  6MWt  - 288m, no desats <88%, no need for supplemental O2.   09/2015 68mwt - 323m, lowest sat 95%, completed 8.5 of 51min, stopped early due to leg pain  02/02/2016: 23mwt - 484m/1378ft, lowest sat 97%, highest HR 80. Again on supplemental O2 for PVD with Claudication (given by Cards and Vasc Surg) and OSA with  hypoxemia at night.   Plan: - cont with Advair to 500/50 (90 day rx) - 1 puff in the AM and PM - gargle and rinse after each use.  - cont with Spiriva as directed.  - albuterol rescue inhaler - 2puff every 3-4 hours as needed for shortness of breath\wheezing\recurrent cough - may use albuterol rescue inhaler, 2 puffs, 15 mins prior to any exertional activities (lawn care, exercise, etc).  - Oxygen via nasal cannula, 2 L at night, with naps/exercise, may take breaks when sedentary (watching TV sitting on couch, etc.) disease.    Patient/Family are satisfied with Plan of action and management. All questions answered  Corrin Parker, M.D.  [facility]  Medical Director [place] Director [facility]-P[facility] Department

## 2016-09-13 ENCOUNTER — Encounter: Payer: Self-pay | Admitting: Internal Medicine

## 2016-09-13 ENCOUNTER — Other Ambulatory Visit: Payer: Self-pay

## 2016-09-13 MED ORDER — FLUTICASONE-SALMETEROL 500-50 MCG/DOSE IN AEPB: 1.0000 | INHALATION_SPRAY | Freq: Two times a day (BID) | RESPIRATORY_TRACT | 5 refills | Status: DC

## 2016-09-13 NOTE — Telephone Encounter (Signed)
This encounter was created in error - please disregard.

## 2016-09-13 NOTE — Telephone Encounter (Signed)
Pt is completely out

## 2016-11-15 ENCOUNTER — Encounter: Payer: Self-pay | Admitting: Family

## 2016-11-15 ENCOUNTER — Ambulatory Visit: Payer: Medicare Other | Attending: Family | Admitting: Family

## 2016-11-15 VITALS — BP 128/52 | HR 63 | Resp 20 | Ht 72.0 in | Wt 217.2 lb

## 2016-11-15 DIAGNOSIS — E1122 Type 2 diabetes mellitus with diabetic chronic kidney disease: Secondary | ICD-10-CM | POA: Diagnosis not present

## 2016-11-15 DIAGNOSIS — Z9989 Dependence on other enabling machines and devices: Secondary | ICD-10-CM

## 2016-11-15 DIAGNOSIS — G4733 Obstructive sleep apnea (adult) (pediatric): Secondary | ICD-10-CM | POA: Insufficient documentation

## 2016-11-15 DIAGNOSIS — Z9889 Other specified postprocedural states: Secondary | ICD-10-CM | POA: Insufficient documentation

## 2016-11-15 DIAGNOSIS — I5032 Chronic diastolic (congestive) heart failure: Secondary | ICD-10-CM | POA: Diagnosis present

## 2016-11-15 DIAGNOSIS — Z803 Family history of malignant neoplasm of breast: Secondary | ICD-10-CM | POA: Insufficient documentation

## 2016-11-15 DIAGNOSIS — Z800 Family history of malignant neoplasm of digestive organs: Secondary | ICD-10-CM | POA: Diagnosis not present

## 2016-11-15 DIAGNOSIS — Z888 Allergy status to other drugs, medicaments and biological substances status: Secondary | ICD-10-CM | POA: Insufficient documentation

## 2016-11-15 DIAGNOSIS — I2510 Atherosclerotic heart disease of native coronary artery without angina pectoris: Secondary | ICD-10-CM | POA: Diagnosis not present

## 2016-11-15 DIAGNOSIS — Z794 Long term (current) use of insulin: Secondary | ICD-10-CM | POA: Insufficient documentation

## 2016-11-15 DIAGNOSIS — Z823 Family history of stroke: Secondary | ICD-10-CM | POA: Insufficient documentation

## 2016-11-15 DIAGNOSIS — Z833 Family history of diabetes mellitus: Secondary | ICD-10-CM | POA: Insufficient documentation

## 2016-11-15 DIAGNOSIS — Z880 Allergy status to penicillin: Secondary | ICD-10-CM | POA: Insufficient documentation

## 2016-11-15 DIAGNOSIS — I4891 Unspecified atrial fibrillation: Secondary | ICD-10-CM | POA: Insufficient documentation

## 2016-11-15 DIAGNOSIS — I130 Hypertensive heart and chronic kidney disease with heart failure and stage 1 through stage 4 chronic kidney disease, or unspecified chronic kidney disease: Secondary | ICD-10-CM | POA: Insufficient documentation

## 2016-11-15 DIAGNOSIS — N189 Chronic kidney disease, unspecified: Secondary | ICD-10-CM | POA: Insufficient documentation

## 2016-11-15 DIAGNOSIS — J439 Emphysema, unspecified: Secondary | ICD-10-CM

## 2016-11-15 DIAGNOSIS — Z87891 Personal history of nicotine dependence: Secondary | ICD-10-CM | POA: Diagnosis not present

## 2016-11-15 DIAGNOSIS — N183 Chronic kidney disease, stage 3 unspecified: Secondary | ICD-10-CM

## 2016-11-15 DIAGNOSIS — Z7982 Long term (current) use of aspirin: Secondary | ICD-10-CM | POA: Insufficient documentation

## 2016-11-15 DIAGNOSIS — Z885 Allergy status to narcotic agent status: Secondary | ICD-10-CM | POA: Insufficient documentation

## 2016-11-15 DIAGNOSIS — I4892 Unspecified atrial flutter: Secondary | ICD-10-CM | POA: Diagnosis not present

## 2016-11-15 DIAGNOSIS — J441 Chronic obstructive pulmonary disease with (acute) exacerbation: Secondary | ICD-10-CM | POA: Diagnosis not present

## 2016-11-15 DIAGNOSIS — I10 Essential (primary) hypertension: Secondary | ICD-10-CM

## 2016-11-15 DIAGNOSIS — Z7902 Long term (current) use of antithrombotics/antiplatelets: Secondary | ICD-10-CM | POA: Insufficient documentation

## 2016-11-15 NOTE — Patient Instructions (Signed)
Continue weighing daily and call for an overnight weight gain of > 2 pounds or a weekly weight gain of >5 pounds. 

## 2016-11-15 NOTE — Progress Notes (Signed)
Patient ID: Ernest Mclean, male    DOB: 01-30-1941, 76 y.o.   MRN: [id number]  HPI  Ernest Mclean is a 76 y/o male with a history of Atrial fibrillation, CAD, CKD, COPD, diabetes, HTN, PVD, PAD, obstructive sleep apnea (on CPAP), remote tobacco use and chronic HF with preserved EF.  Recent echo done 08/02/16 and showed an EF of 60-65% along with mild Ernest/TR. Previous echo was done 07/09/15 which showed an EF of 45-50% with trivial Ernest. No aortic stenosis. EF is down from June 2016 when it was >55%. Last PFT's done September 2016 with recent 6 minute walk test completed by pulmonology on 02/02/16.   Admitted 08/01/16 with HF/ COPD exacerbation. Initially given IV diuretics and cardiology consult was done. Scheduled nebulizers and inhalers were also given. Needed one dose of IV lopressor due to aflutter with RVR. Discharged home after 2 days. Previous hospital admission was 07/09/15 with acute HF exacerbation. Was diuresed with IV Lasix, given antibiotics for possible pneumonia and was qualified for around the clock oxygen use.   He presents today for a follow-up visit with a chief complaint of mild fatigue with moderate exertion. He describes this as having been present for many years with waxing/waning of severity. He has associated shortness of breath, right leg pain and anxiety along with this.   Past Medical History:  Diagnosis Date  . Atrial fibrillation ([place])   . CAD (coronary artery disease)   . CKD (chronic kidney disease)   . Congestive heart failure ([place])   . COPD (chronic obstructive pulmonary disease) ([place])   . Diabetes ([place])   . Hypertension   . OSA on CPAP    Past Surgical History:  Procedure Laterality Date  . CLAVICLE SURGERY    . heart bypass    . HERNIA REPAIR     Family History  Problem Relation Age of Onset  . Stroke Unknown   . Diabetes Unknown   . Breast cancer Unknown   . Colon cancer Unknown    Social History  Substance Use Topics  . Smoking status: Former Smoker   Packs/day: 1.00    Years: 30.00    Types: Cigarettes  . Smokeless tobacco: Never Used     Comment: quit 12/14/2001  . Alcohol use No   Allergies  Allergen Reactions  . Hydralazine Other (See Comments)    tongue swollen and couldn't wake up ---- not positive it was this or a mix of this with something else or high sugar  . Penicillins Other (See Comments)    Passed out (at 76 yrs old) Has patient had a PCN reaction causing immediate rash, facial/tongue/throat swelling, SOB or lightheadedness with hypotension: No Has patient had a PCN reaction causing severe rash involving mucus membranes or skin necrosis: No Has patient had a PCN reaction that required hospitalization No Has patient had a PCN reaction occurring within the last 10 years: No If all of the above answers are "NO", then may proceed with Cephalosporin use.   . Codeine Hives, Rash and Swelling   Prior to Admission medications   Medication Sig Start Date End Date Taking? Authorizing Provider  albuterol (PROVENTIL HFA;VENTOLIN HFA) 108 (90 BASE) MCG/ACT inhaler Inhale 2 puffs into the lungs every 4 (four) hours as needed for wheezing or shortness of breath. 10/29/14  Yes Mungal, Vishal, MD  amLODipine (NORVASC) 5 MG tablet Take 5 mg by mouth daily.  08/17/14  Yes [provider]  apixaban (ELIQUIS) 5 MG TABS tablet  Take 5 mg by mouth 2 (two) times daily. 08/17/14  Yes [provider]  aspirin EC 81 MG tablet Take 81 mg by mouth daily. 08/17/14  Yes [provider]  citalopram (CELEXA) 20 MG tablet Take 20 mg by mouth at bedtime.  07/30/14  Yes [provider]  CRANBERRY PO Take 820 mg by mouth daily.   Yes [provider]  cyanocobalamin 500 MCG tablet Take 500 mcg by mouth daily.   Yes [provider]  desonide (DESOWEN) 0.05 % cream Apply 1 application topically 2 (two) times daily. 05/10/16  Yes [provider]  diazepam (VALIUM) 5 MG tablet Take 5 mg by mouth every 12  (twelve) hours as needed for anxiety.  10/24/13  Yes [provider]  Fluticasone-Salmeterol (ADVAIR DISKUS) 500-50 MCG/DOSE AEPB Inhale 1 puff into the lungs 2 (two) times daily. 09/13/16  Yes Flora Lipps, MD  furosemide (LASIX) 20 MG tablet Take 20 mg by mouth daily. Take one tablet daily.  Can take extra tablet in afternoon if increase weight gain of 3 pounds or increased swelling.   Yes [provider]  insulin lispro (HUMALOG) 100 UNIT/ML injection Inject 15-25 Units into the skin 3 (three) times daily before meals. 12 units before breakfast, 18 units before lunch, and 23 units before supper plus additional units for sliding scale   Yes [provider]  ketoconazole (NIZORAL) 2 % shampoo Apply 1 application topically 2 (two) times a week. 05/11/16  Yes [provider]  LANTUS 100 UNIT/ML injection Inject 0.6 mLs (60 Units total) into the skin at bedtime. Patient taking differently: Inject 70 Units into the skin at bedtime.  08/03/16  Yes Fritzi Mandes, MD  lisinopril (PRINIVIL,ZESTRIL) 5 MG tablet Take 5 mg by mouth daily.   Yes [provider]  metFORMIN (GLUCOPHAGE) 500 MG tablet Take 1,000 mg by mouth every evening.    Yes [provider]  metoprolol (LOPRESSOR) 100 MG tablet Take 100 mg by mouth 2 (two) times daily. 06/17/14  Yes [provider]  OMEGA-3 FATTY ACIDS-VITAMIN E PO Take 1 capsule by mouth daily.   Yes [provider]  pravastatin (PRAVACHOL) 20 MG tablet Take 20 mg by mouth at bedtime. 07/31/14  Yes [provider]  tiotropium (SPIRIVA) 18 MCG inhalation capsule Place 18 mcg into inhaler and inhale daily. 07/31/14  Yes [provider]  vitamin C (ASCORBIC ACID) 500 MG tablet Take 500 mg by mouth daily.   Yes [provider]    Review of Systems  Constitutional: Positive for fatigue (with overexertion). Negative for appetite change.  HENT: Positive for voice change (raspy voice).  Negative for congestion and postnasal drip.   Eyes: Negative.   Respiratory: Positive for cough and shortness of breath. Negative for chest tightness.   Cardiovascular: Negative for chest pain, palpitations and leg swelling.  Gastrointestinal: Negative for abdominal distention and abdominal pain.  Endocrine: Negative.   Genitourinary: Negative.   Musculoskeletal: Positive for arthralgias (right calf hurts when walking >15 minutes). Negative for back pain and neck pain.  Skin: Negative.   Allergic/Immunologic: Negative.   Neurological: Negative for dizziness and light-headedness.  Hematological: Negative for adenopathy. Does not bruise/bleed easily.  Psychiatric/Behavioral: Negative for dysphoric mood and sleep disturbance (wearing oxygen and CPAP at bedtime). The patient is nervous/anxious.    Vitals:   11/15/16 0856  BP: (!) 128/52  Pulse: 63  Resp: 20  SpO2: 97%  Weight: 217 lb 4 oz (98.5 kg)  Height: 6' (1.829 m)   Wt Readings from Last 3 Encounters:  11/15/16 217 lb 4 oz (98.5 kg)  09/01/16 212 lb (96.2 kg)  08/17/16 210 lb (95.3 kg)   Lab Results  Component Value Date   CREATININE 1.64 (H) 08/02/2016   CREATININE 1.16 08/01/2016   CREATININE 1.76 (H) 05/24/2016    Physical Exam  Constitutional: He is oriented to person, place, and time. He appears well-developed and well-nourished.  HENT:  Head: Normocephalic and atraumatic.  Neck: Normal range of motion. Neck supple. No JVD present.  Cardiovascular: Normal rate and regular rhythm.   Pulmonary/Chest: Effort normal. He has no wheezes. He has no rales.  Abdominal: Soft. He exhibits no distension. There is no tenderness.  Musculoskeletal: He exhibits no edema or tenderness.  Neurological: He is alert and oriented to person, place, and time.  Skin: Skin is warm and dry.  Psychiatric: He has a normal mood and affect. His behavior is normal. Thought content normal.  Nursing note and vitals reviewed.   Assessment &  Plan:  1: Chronic heart failure with preserved ejection fraction- - NYHA Class II - Euvolemic today - Continue daily weights with taking additional furosemide for overnight weight gain of >2 pounds/weekly weight gain of >5 pounds - Continue daily walking with rest as needed for calf pain - has been taking his diuretic every day  - Last saw cardiologist Clayborn Bigness)  08/10/16; returns to him September 2018  2: HTN- -  BP looks great today - Saw PCP ([place]) on 09/06/16 and returns to him 11/29/16  3: Obstructive sleep apnea- - Wearing CPAP on a nightly basis - Wears oxygen at 2L [facility]  4: Diabetes- - glucose this morning was 190 - saw endocrinologist ([place]) 10/25/16 - saw nephrologist ([place]) on 11/14/16  5: COPD- - Saw pulmonologist ([place]) 06/01/16 - Using inhalers & oxygen  Patient did not bring his medications nor a list. Each medication was verbally reviewed with the patient and he was encouraged to bring the bottles to every visit to confirm accuracy of list.  Return in 5 months or sooner for any questions/problems before then.

## 2017-03-02 ENCOUNTER — Ambulatory Visit: Payer: Medicare Other | Admitting: Internal Medicine

## 2017-03-05 ENCOUNTER — Encounter: Payer: Self-pay | Admitting: Internal Medicine

## 2017-03-05 ENCOUNTER — Ambulatory Visit (INDEPENDENT_AMBULATORY_CARE_PROVIDER_SITE_OTHER): Payer: Medicare Other | Admitting: Internal Medicine

## 2017-03-05 VITALS — BP 128/72 | HR 62 | Ht 72.0 in | Wt 225.0 lb

## 2017-03-05 DIAGNOSIS — J9611 Chronic respiratory failure with hypoxia: Secondary | ICD-10-CM

## 2017-03-05 NOTE — Patient Instructions (Addendum)
Continue oxygen as prescribed Continue inhalers as prescribed  Exercise as tolerated

## 2017-03-05 NOTE — Progress Notes (Signed)
[name]      MRN# [id number] Ernest Mclean 11-24-1940   CC: Follow up COPD  Brief History: 76 yo male with COPD, PVD with peripheral claudication, OSA  worst after PNA in 07/2014, now following for COPD and Chronic Hypoxic respiratory failure on 2L o2 (maily for PVD and at night for OSA). Pulmonary nodules on CT, sub cm, mainly due to inflammatory/infectious process.   Events since last clinic visit:aHPI Patient presents today for follow-up visit of his COPD and chronic hypoxic respiratory failure/OSA/nodules At his last visit he's noted to be on 2 L of supplemental oxygen at night Patient states he is uses CPAP daily and compliant He will need to use CPAP with naps as well  Doing incentive spirometer 25 times daily averaging about 2000cc.   follow up CT Chest-, showed some inflammatory nodules consistent with his recent infection and has completely resolved  No acute issues at this time No signs of infection at this time Doing well overall.   Recent admission for acute Afib with RVR and CHF exacerbation doing well now on lasix therapy     Medication:   Current Outpatient Prescriptions:  .  albuterol (PROVENTIL HFA;VENTOLIN HFA) 108 (90 BASE) MCG/ACT inhaler, Inhale 2 puffs into the lungs every 4 (four) hours as needed for wheezing or shortness of breath., Disp: 1 Inhaler, Rfl: 2 .  amLODipine (NORVASC) 5 MG tablet, Take 5 mg by mouth daily. , Disp: , Rfl:  .  apixaban (ELIQUIS) 5 MG TABS tablet, Take 5 mg by mouth 2 (two) times daily., Disp: , Rfl:  .  aspirin EC 81 MG tablet, Take 81 mg by mouth daily., Disp: , Rfl:  .  citalopram (CELEXA) 20 MG tablet, Take 20 mg by mouth at bedtime. , Disp: , Rfl: 5 .  CRANBERRY PO, Take 820 mg by mouth daily., Disp: , Rfl:  .  cyanocobalamin 500 MCG tablet, Take 500 mcg by mouth daily., Disp: , Rfl:  .  desonide (DESOWEN) 0.05 % cream, Apply 1 application topically 2 (two) times daily., Disp: , Rfl:    .  diazepam (VALIUM) 5 MG tablet, Take 5 mg by mouth every 12 (twelve) hours as needed for anxiety. , Disp: , Rfl:  .  Fluticasone-Salmeterol (ADVAIR DISKUS) 500-50 MCG/DOSE AEPB, Inhale 1 puff into the lungs 2 (two) times daily., Disp: 60 each, Rfl: 5 .  furosemide (LASIX) 20 MG tablet, Take 20 mg by mouth daily. Take one tablet daily.  Can take extra tablet in afternoon if increase weight gain of 3 pounds or increased swelling., Disp: , Rfl:  .  insulin glargine (LANTUS) 100 UNIT/ML injection, Inject 70 Units into the skin at bedtime., Disp: , Rfl:  .  insulin lispro (HUMALOG) 100 UNIT/ML injection, Inject 15-25 Units into the skin 3 (three) times daily before meals. 12 units before breakfast, 18 units before lunch, and 23 units before supper plus additional units for sliding scale, Disp: , Rfl:  .  ketoconazole (NIZORAL) 2 % shampoo, Apply 1 application topically 2 (two) times a week., Disp: , Rfl:  .  lisinopril (PRINIVIL,ZESTRIL) 5 MG tablet, Take 5 mg by mouth daily., Disp: , Rfl:  .  metFORMIN (GLUCOPHAGE) 500 MG tablet, Take 1,000 mg by mouth every evening. , Disp: , Rfl:  .  metoprolol (LOPRESSOR) 100 MG tablet, Take 100 mg by mouth 2 (two) times daily., Disp: , Rfl: 0 .  OMEGA-3 FATTY ACIDS-VITAMIN E PO, Take 1 capsule by mouth  daily., Disp: , Rfl:  .  pravastatin (PRAVACHOL) 20 MG tablet, Take 20 mg by mouth at bedtime., Disp: , Rfl: 3 .  tiotropium (SPIRIVA) 18 MCG inhalation capsule, Place 18 mcg into inhaler and inhale daily., Disp: , Rfl:  .  vitamin C (ASCORBIC ACID) 500 MG tablet, Take 500 mg by mouth daily., Disp: , Rfl:     Review of Systems  Constitutional: Negative for chills, fever and weight loss.  HENT: Negative for hearing loss.   Eyes: Negative for blurred vision.  Respiratory: Positive for shortness of breath. Negative for cough, sputum production and wheezing.        Improving SOB, now mostly with exertion  Cardiovascular: Positive for claudication. Negative for  chest pain, palpitations and orthopnea.  Gastrointestinal: Negative for heartburn and nausea.  Genitourinary: Negative for dysuria.  Musculoskeletal: Positive for myalgias.       Chronic leg pain with exercise or exertion, has PVD/PAD  Skin: Negative for rash.  Neurological: Negative for headaches.     BP 128/72 (BP Location: Left Arm, Cuff Size: Normal)   Pulse 62   Ht 6' (1.829 m)   Wt 225 lb (102.1 kg)   SpO2 97%   BMI 30.52 kg/m   Allergies:  Hydralazine; Penicillins; and Codeine  Physical Examination:   General Appearance: No distress  HEENT: PERRLA, no ptosis, no other lesions noticed Pulmonary:normal breath sounds., diaphragmatic excursion normal.No wheezing, No rales   Cardiovascular:  Normal S1,S2.  No m/r/g.     Abdomen:Exam: Benign, Soft, non-tender, No masses  Skin:   warm, no rashes, no ecchymosis  Extremities: normal, no cyanosis, clubbing, warm with normal capillary refill.       6MWT - 02/10/15 - distance: 219m - lowest saturation: 97% - no complaints during walk, no need for supplemental O2 with exertion  6MWT 09/20/2015 - distance 1179ft - lowest sat  95% - History of peripheral vascular disease and claudication, completed 8.5 minutes of 10 minutes of testing, stopped early due to leg pain  PFTs 02/10/15 postBD FEV1 75% postBD FVC 70% FEV1/FVC 77% RV 147 TLC 126 RV/TLC 110 DLCO - unable toperform Impression - decrease FEV1, preserved FEV1/FVC ratio. Mild/Mod obstruction on inhalers, air trapping/hyperinflation noted.  Significant response to BD, >286ml on FVC and FEV1 post BD   EADIOLOGY: (The following images and results were reviewed by Dr. Stevenson Clinch on 03/05/2017). CT Chest 02/15/16 IMPRESSION:CT chest 01/2016 1. Several new pulmonary nodules since the prior chest CT in June 2016. 2. Patchy area of tree-in-bud appearance in the right upper lobe is likely inflammation or atypical infection. 3. Probable area of rounded atelectasis in the left  lower lobe. 4. Stable emphysematous changes. 5. Advanced atherosclerotic calcifications involving the thoracic and abdominal aorta and branch vessels. 6. Scattered mediastinal and hilar lymph nodes. 7. I do not think PET-CT would be helpful as these nodules are too small. Abdominal/pelvic CT may be helpful to exclude malignancy. If this is negative a three-month follow-up chest CT is suggested.  Repeat CT chest 05/2016 IMPRESSION: 1. Scattered small pulmonary nodules, regarded as new on the prior exam, are smaller or resolved on the current exam. 2. Resolved subpleural consolidation/ground-glass in the left lower lobe with residual scarring. 3. Additional pulmonary nodules are unchanged from 07/15/2014 and considered benign. 4. Aortic atherosclerosis (ICD10-170.0). Coronary artery calcification. 5. Cholelithiasis.  01/2015 PFTs  - preserved FEV1/FVC ratio, airtrapping and hyperinflation, significant response to BD.  6MWt  - 255m, no desats <88%, no need for supplemental O2.  09/2015 69mwt - 330m, lowest sat 95%, completed 8.5 of 15min, stopped early due to leg pain  02/02/2016: 87mwt - 446m/1378ft, lowest sat 97%, highest HR 80. Again on supplemental O2 for PVD with Claudication (given by Cards and Vasc Surg) and OSA with hypoxemia at night.    Assessment and Plan:  76 year old male presents for follow-up visit of Mod COPD gold stage C with CKD,CHF with OSA and chronic hypoxic resp failure with deconditioned state   Solitary pulmonary nodule previous nodules after recent infection have resolved -no new CT scans needed at this time   OSA on CPAP Patient with a known history of OSA on CPAP 10 cm h20 Overnight pulse oximetry showed multiple events (80) occurring between 89 and 85% saturation. Continue with 2 L supplemental oxygen on CPAP. Plan: -Continue with supplemental oxygen (2 L) with CPAP at night -CPAP compliance discussed with patient, overall doing well.    COPD  (chronic obstructive pulmonary disease) (HCC) Seems to be stable at this time Plan: - cont with Advair to 500/50 -- gargle and rinse after each use.  - cont with Spiriva as directed.  - albuterol rescue inhaler - 2puff every 3-4 hours as needed for shortness of breath\wheezing\recurrent cough - may use albuterol rescue inhaler, 2 puffs, 15 mins prior to any exertional activities (lawn care, exercise, etc).  - Oxygen via nasal cannula, 2 L at night,  Check 6MWT  Deconditioned state -Recommend increased daily activity and exercise   Patient/Family are satisfied with Plan of action and management. All questions answered Follow up in 6 months   Chrisa Hassan Patricia Pesa, M.D.  [facility]  Medical Director [place] Director [facility] Cardio-Pulmonary Department

## 2017-03-09 ENCOUNTER — Ambulatory Visit (INDEPENDENT_AMBULATORY_CARE_PROVIDER_SITE_OTHER): Payer: Medicare Other | Admitting: *Deleted

## 2017-03-09 DIAGNOSIS — J439 Emphysema, unspecified: Secondary | ICD-10-CM | POA: Diagnosis not present

## 2017-03-09 NOTE — Progress Notes (Signed)
SMW performed today.  SIX MIN WALK 02/02/2016 09/20/2015 02/10/2015 08/17/2014  Medications Humalog insulin Eliquis, Aspirin, Cranberry, B12, Advair, Lasix, Humalog Insulin, Metoprolol, Fish Oil, Prilosec, Spiriva Novolog amlodipine, aspirin, novalog, metoprolol, prilosec, lasix  Supplimental Oxygen during Test? (L/min) No No No No  Laps 8 7 5 5   Partial Lap (in Meters) 36 24 33 21  Baseline BP (sitting) 152/64 152/90 154/84 142/78  Baseline Heartrate 64 61 66 66  Baseline Dyspnea (Borg Scale) 0.5 2 0 1  Baseline Fatigue (Borg Scale) 0 0 0 0  Baseline SPO2 97 97 97 98  BP (sitting) 162/78 162/80 140/72 160/70  Heartrate 80 114 69 109  Dyspnea (Borg Scale) 2 3 1 2   Fatigue (Borg Scale) 0 0 0 0  SPO2 97 95 97 95  BP (sitting) 118/58 142/82 140/70 138/82  Heartrate 60 87 63 103  SPO2 98 97 100 97  Stopped or Paused before Six Minutes No Yes No No  Other Symptoms at end of Exercise - Pt suppose to walk 10 mins but had to stop at 8.5 minutes due to calf pain - -  Distance Completed 420 360 273 261  Tech Comments: Pt did a 10 minute walk per Dr. Stevenson Clinch w/o any complications at a moderate pace. pt walked as fast as he could. - Pt walked at moderate pace. O2 level was checked at 3 minute mark due to pt wearing O2 cont.  Provider Comments: - - noted, see clinic note noted, see clinc note for full details.

## 2017-03-13 ENCOUNTER — Other Ambulatory Visit: Payer: Self-pay | Admitting: *Deleted

## 2017-03-13 MED ORDER — FLUTICASONE-SALMETEROL 500-50 MCG/DOSE IN AEPB: 1.0000 | INHALATION_SPRAY | Freq: Two times a day (BID) | RESPIRATORY_TRACT | 5 refills | Status: DC

## 2017-03-19 ENCOUNTER — Ambulatory Visit: Payer: Medicare Other | Attending: Family | Admitting: Family

## 2017-03-19 ENCOUNTER — Encounter: Payer: Self-pay | Admitting: Family

## 2017-03-19 VITALS — BP 141/46 | HR 56 | Resp 18 | Ht 72.0 in | Wt 224.4 lb

## 2017-03-19 DIAGNOSIS — Z885 Allergy status to narcotic agent status: Secondary | ICD-10-CM | POA: Diagnosis not present

## 2017-03-19 DIAGNOSIS — Z7951 Long term (current) use of inhaled steroids: Secondary | ICD-10-CM | POA: Insufficient documentation

## 2017-03-19 DIAGNOSIS — Z888 Allergy status to other drugs, medicaments and biological substances status: Secondary | ICD-10-CM | POA: Insufficient documentation

## 2017-03-19 DIAGNOSIS — N189 Chronic kidney disease, unspecified: Secondary | ICD-10-CM | POA: Diagnosis not present

## 2017-03-19 DIAGNOSIS — I130 Hypertensive heart and chronic kidney disease with heart failure and stage 1 through stage 4 chronic kidney disease, or unspecified chronic kidney disease: Secondary | ICD-10-CM | POA: Diagnosis not present

## 2017-03-19 DIAGNOSIS — Z7982 Long term (current) use of aspirin: Secondary | ICD-10-CM | POA: Insufficient documentation

## 2017-03-19 DIAGNOSIS — I4891 Unspecified atrial fibrillation: Secondary | ICD-10-CM | POA: Insufficient documentation

## 2017-03-19 DIAGNOSIS — J449 Chronic obstructive pulmonary disease, unspecified: Secondary | ICD-10-CM | POA: Diagnosis not present

## 2017-03-19 DIAGNOSIS — Z880 Allergy status to penicillin: Secondary | ICD-10-CM | POA: Diagnosis not present

## 2017-03-19 DIAGNOSIS — Z87891 Personal history of nicotine dependence: Secondary | ICD-10-CM | POA: Insufficient documentation

## 2017-03-19 DIAGNOSIS — Z794 Long term (current) use of insulin: Secondary | ICD-10-CM | POA: Insufficient documentation

## 2017-03-19 DIAGNOSIS — Z9989 Dependence on other enabling machines and devices: Secondary | ICD-10-CM

## 2017-03-19 DIAGNOSIS — Z7901 Long term (current) use of anticoagulants: Secondary | ICD-10-CM | POA: Diagnosis not present

## 2017-03-19 DIAGNOSIS — I5032 Chronic diastolic (congestive) heart failure: Secondary | ICD-10-CM

## 2017-03-19 DIAGNOSIS — Z79899 Other long term (current) drug therapy: Secondary | ICD-10-CM | POA: Insufficient documentation

## 2017-03-19 DIAGNOSIS — I2510 Atherosclerotic heart disease of native coronary artery without angina pectoris: Secondary | ICD-10-CM | POA: Diagnosis not present

## 2017-03-19 DIAGNOSIS — G4733 Obstructive sleep apnea (adult) (pediatric): Secondary | ICD-10-CM | POA: Diagnosis not present

## 2017-03-19 DIAGNOSIS — I10 Essential (primary) hypertension: Secondary | ICD-10-CM

## 2017-03-19 DIAGNOSIS — N183 Chronic kidney disease, stage 3 unspecified: Secondary | ICD-10-CM

## 2017-03-19 DIAGNOSIS — E1151 Type 2 diabetes mellitus with diabetic peripheral angiopathy without gangrene: Secondary | ICD-10-CM | POA: Insufficient documentation

## 2017-03-19 DIAGNOSIS — E1122 Type 2 diabetes mellitus with diabetic chronic kidney disease: Secondary | ICD-10-CM | POA: Diagnosis not present

## 2017-03-19 NOTE — Patient Instructions (Signed)
Continue weighing daily and call for an overnight weight gain of > 2 pounds or a weekly weight gain of >5 pounds. 

## 2017-03-19 NOTE — Progress Notes (Signed)
Patient ID: Ernest Mclean, male    DOB: 04-28-1941, 76 y.o.   MRN: [id number]  HPI  Mr Wieck is a 76 y/o male with a history of Atrial fibrillation, CAD, CKD, COPD, diabetes, HTN, PVD, PAD, obstructive sleep apnea (on CPAP), remote tobacco use and chronic HF with preserved EF.  Recent echo done 08/02/16 and showed an EF of 60-65% along with mild MR/TR. Previous echo was done 07/09/15 which showed an EF of 45-50% with trivial MR. No aortic stenosis. EF is down from June 2016 when it was >55%. Last PFT's done September 2016 with recent 6 minute walk test completed by pulmonology on 02/02/16.   Admitted 08/01/16 with HF/ COPD exacerbation. Initially given IV diuretics and cardiology consult was done. Scheduled nebulizers and inhalers were also given. Needed one dose of IV lopressor due to aflutter with RVR. Discharged home after 2 days.   He presents today for a follow-up visit with a chief complaint of mild fatigue with moderate exertion. He describes this as having been present for many years with waxing/waning of severity. He has associated shortness of breath, right calf pain upon walking and anxiety along with this. Denies any chest pain, edema, palpitations, dizziness or weight gain  Past Medical History:  Diagnosis Date  . Atrial fibrillation ([place])   . CAD (coronary artery disease)   . CKD (chronic kidney disease)   . Congestive heart failure ([place])   . COPD (chronic obstructive pulmonary disease) ([place])   . Diabetes ([place])   . Hypertension   . OSA on CPAP    Past Surgical History:  Procedure Laterality Date  . CLAVICLE SURGERY    . heart bypass    . HERNIA REPAIR     Family History  Problem Relation Age of Onset  . Stroke Unknown   . Diabetes Unknown   . Breast cancer Unknown   . Colon cancer Unknown    Social History  Substance Use Topics  . Smoking status: Former Smoker    Packs/day: 1.00    Years: 30.00    Types: Cigarettes  . Smokeless tobacco: Never Used     Comment:  quit 12/14/2001  . Alcohol use No   Allergies  Allergen Reactions  . Hydralazine Other (See Comments)    tongue swollen and couldn't wake up ---- not positive it was this or a mix of this with something else or high sugar  . Penicillins Other (See Comments)    Passed out (at 76 yrs old) Has patient had a PCN reaction causing immediate rash, facial/tongue/throat swelling, SOB or lightheadedness with hypotension: No Has patient had a PCN reaction causing severe rash involving mucus membranes or skin necrosis: No Has patient had a PCN reaction that required hospitalization No Has patient had a PCN reaction occurring within the last 10 years: No If all of the above answers are "NO", then may proceed with Cephalosporin use.   . Codeine Hives, Rash and Swelling   Prior to Admission medications   Medication Sig Start Date End Date Taking? Authorizing Provider  albuterol (PROVENTIL HFA;VENTOLIN HFA) 108 (90 BASE) MCG/ACT inhaler Inhale 2 puffs into the lungs every 4 (four) hours as needed for wheezing or shortness of breath. 10/29/14  Yes Mungal, Vishal, MD  amLODipine (NORVASC) 5 MG tablet Take 5 mg by mouth daily.  08/17/14  Yes [provider]  apixaban (ELIQUIS) 5 MG TABS tablet Take 5 mg by mouth 2 (two) times daily. 08/17/14  Yes [provider]  aspirin EC 81 MG tablet Take 81 mg by mouth daily. 08/17/14  Yes [provider]  citalopram (CELEXA) 20 MG tablet Take 20 mg by mouth at bedtime.  07/30/14  Yes [provider]  CRANBERRY PO Take 820 mg by mouth daily.   Yes [provider]  cyanocobalamin 500 MCG tablet Take 500 mcg by mouth daily.   Yes [provider]  diazepam (VALIUM) 5 MG tablet Take 5 mg by mouth every 12 (twelve) hours as needed for anxiety.  10/24/13  Yes [provider]  ergocalciferol (VITAMIN D2) 50000 units capsule Take 50,000 Units by mouth every 30 (thirty) days.   Yes [provider]   Fluticasone-Salmeterol (ADVAIR DISKUS) 500-50 MCG/DOSE AEPB Inhale 1 puff into the lungs 2 (two) times daily. 03/13/17  Yes Flora Lipps, MD  furosemide (LASIX) 20 MG tablet Take 20 mg by mouth daily. Take one tablet daily.  Can take extra tablet in afternoon if increase weight gain of 3 pounds or increased swelling.   Yes [provider]  insulin glargine (LANTUS) 100 UNIT/ML injection Inject 70 Units into the skin at bedtime.   Yes [provider]  insulin lispro (HUMALOG) 100 UNIT/ML injection Inject 15-25 Units into the skin 3 (three) times daily before meals. 15 units before breakfast, 25 units before lunch, and 25 units before supper plus additional units for sliding scale   Yes [provider]  ketoconazole (NIZORAL) 2 % shampoo Apply 1 application topically 2 (two) times a week. 05/11/16  Yes [provider]  lisinopril (PRINIVIL,ZESTRIL) 5 MG tablet Take 5 mg by mouth daily.   Yes [provider]  metFORMIN (GLUCOPHAGE) 500 MG tablet Take 1,000 mg by mouth every evening.    Yes [provider]  metoprolol (LOPRESSOR) 100 MG tablet Take 100 mg by mouth 2 (two) times daily. 06/17/14  Yes [provider]  OMEGA-3 FATTY ACIDS-VITAMIN E PO Take 1 capsule by mouth daily.   Yes [provider]  pravastatin (PRAVACHOL) 20 MG tablet Take 20 mg by mouth at bedtime. 07/31/14  Yes [provider]  tiotropium (SPIRIVA) 18 MCG inhalation capsule Place 18 mcg into inhaler and inhale daily. 07/31/14  Yes [provider]  vitamin C (ASCORBIC ACID) 500 MG tablet Take 500 mg by mouth daily.   Yes [provider]   Review of Systems  Constitutional: Positive for fatigue (with overexertion). Negative for appetite change.  HENT: Positive for voice change (raspy voice). Negative for congestion and postnasal drip.   Eyes: Negative.   Respiratory: Positive for shortness of breath. Negative for cough and chest  tightness.   Cardiovascular: Negative for chest pain, palpitations and leg swelling.  Gastrointestinal: Negative for abdominal distention and abdominal pain.  Endocrine: Negative.   Genitourinary: Negative.   Musculoskeletal: Positive for arthralgias (right calf hurts when walking >15 minutes). Negative for back pain and neck pain.  Skin: Negative.   Allergic/Immunologic: Negative.   Neurological: Negative for dizziness and light-headedness.  Hematological: Negative for adenopathy. Does not bruise/bleed easily.  Psychiatric/Behavioral: Negative for dysphoric mood and sleep disturbance (wearing oxygen and CPAP at bedtime). The patient is nervous/anxious.    Vitals:   03/19/17 1333  BP: (!) 141/46  Pulse: (!) 56  Resp: 18  SpO2: 95%  Weight: 224 lb 6 oz (101.8 kg)  Height: 6' (1.829 m)   Wt Readings from Last 3 Encounters:  03/20/17 223 lb (101.2 kg)  03/19/17 224 lb 6 oz (101.8 kg)  03/05/17 225 lb (102.1 kg)    Lab Results  Component Value Date   CREATININE 1.64 (H) 08/02/2016   CREATININE 1.16 08/01/2016   CREATININE 1.76 (H) 05/24/2016    Physical Exam  Constitutional: He is oriented to person, place, and time. He appears well-developed and well-nourished.  HENT:  Head: Normocephalic and atraumatic.  Neck: Normal range of motion. Neck supple. No JVD present.  Cardiovascular: Normal rate and regular rhythm.   Pulmonary/Chest: Effort normal. He has no wheezes. He has no rales.  Abdominal: Soft. He exhibits no distension. There is no tenderness.  Musculoskeletal: He exhibits no edema or tenderness.  Neurological: He is alert and oriented to person, place, and time.  Skin: Skin is warm and dry.  Psychiatric: He has a normal mood and affect. His behavior is normal. Thought content normal.  Nursing note and vitals reviewed.   Assessment & Plan:  1: Chronic heart failure with preserved ejection fraction- - NYHA Class II - Euvolemic today - Continue daily weights with  taking additional furosemide for overnight weight gain of >2 pounds/weekly weight gain of >5 pounds - weight up 7 pounds since he was last here - Continue daily walking with rest as needed for calf pain - has been taking his diuretic every day  - not adding salt and patient's wife diligently reads food labels for sodium content - Last saw cardiologist Clayborn Bigness)  02/13/17 - patient reports receiving his flu vaccine for the season - seeing vascular (Dew) tomorrow to evaluate claudication pain  2: HTN- -  BP looks great today - Saw PCP (Bronstein) on 01/11/17 - BMP from 02/06/17 reviewed and shows sodium 130, potassium 4.5 and GFR 40  3: Obstructive sleep apnea- - Wearing CPAP on a nightly basis - Wears oxygen at 2L QHS  4: Diabetes- - still having fluctuations in glucose - saw endocrinologist (Solum) 02/13/17 - saw nephrologist [facility]) recently  Patient did not bring his medications nor a list. Each medication was verbally reviewed with the patient and he was encouraged to bring the bottles to every visit to confirm accuracy of list.  Return in 4 months or sooner for any questions/problems before then.

## 2017-03-20 ENCOUNTER — Ambulatory Visit (INDEPENDENT_AMBULATORY_CARE_PROVIDER_SITE_OTHER): Payer: Self-pay | Admitting: Vascular Surgery

## 2017-03-20 ENCOUNTER — Encounter (INDEPENDENT_AMBULATORY_CARE_PROVIDER_SITE_OTHER): Payer: Self-pay | Admitting: Vascular Surgery

## 2017-03-20 VITALS — BP 114/56 | HR 62 | Resp 16 | Ht 72.0 in | Wt 223.0 lb

## 2017-03-20 DIAGNOSIS — N183 Chronic kidney disease, stage 3 unspecified: Secondary | ICD-10-CM | POA: Insufficient documentation

## 2017-03-20 DIAGNOSIS — Z794 Long term (current) use of insulin: Secondary | ICD-10-CM

## 2017-03-20 DIAGNOSIS — I4892 Unspecified atrial flutter: Secondary | ICD-10-CM

## 2017-03-20 DIAGNOSIS — E1122 Type 2 diabetes mellitus with diabetic chronic kidney disease: Secondary | ICD-10-CM | POA: Diagnosis not present

## 2017-03-20 DIAGNOSIS — I7025 Atherosclerosis of native arteries of other extremities with ulceration: Secondary | ICD-10-CM | POA: Diagnosis not present

## 2017-03-20 DIAGNOSIS — I10 Essential (primary) hypertension: Secondary | ICD-10-CM

## 2017-03-20 DIAGNOSIS — I509 Heart failure, unspecified: Secondary | ICD-10-CM | POA: Diagnosis not present

## 2017-03-20 DIAGNOSIS — N189 Chronic kidney disease, unspecified: Secondary | ICD-10-CM | POA: Diagnosis not present

## 2017-03-20 NOTE — Assessment & Plan Note (Signed)
Has chronic reduced ejection fraction of about 35-40% per the wife.  Certainly would be high risk for open surgical therapy.

## 2017-03-20 NOTE — Assessment & Plan Note (Signed)
We will need to limit contrast with any intervention, and this probably precludes our ability to get a CT scan.

## 2017-03-20 NOTE — Progress Notes (Signed)
Patient ID: Ernest Mclean, male   DOB: 1940/09/28, 76 y.o.   MRN: [id number]  Chief Complaint  Patient presents with  . New Patient (Initial Visit)    Claudication    HPI Ernest Mclean is a 76 y.o. male.  I am asked to see the patient by Dr. Clayborn Bigness for evaluation of PAD with claudication.  The patient reports first being told about his peripheral arterial disease back in 2003 at [facility] where he had coronary bypass.  He was then seen back in 2010 locally by a vascular surgeon who is no longer in the medical community.  The family was very disheartened by this encounter as the physician told them he would easily fix it only to be told a few days later that he could not do anything and he should have all of his papers in order.  This left the family very frustrated and he has dealt with claudication symptoms for many years now.  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  It does not appears if any attempt at percutaneous revascularization was given.  Over the past several months, his right lower extremity claudication symptoms have markedly worsened.  He can now only walk very short distances and cannot even get into the [facility] after parking in a handicap space.  The right lower extremity is the predominantly affected leg.  His renal insufficiency has deteriorated and getting a CT scan is likely not an option at this point.  He also has oxygen dependent pulmonary disease although he is not on at this point.  It sounds like this was previously prescribed but he has come off of oxygen during the day.  Complicating the situation now are several wounds on the right calf and lower leg area.  He has several 2-3 cm scabs with some mild surrounding was no major trauma or injury to the area.  He does not have fever or chills.  He has no left leg ulcerations.  There was not a clear inciting event that started the symptoms.  Nothing  really makes the symptoms better.   Past Medical History:  Diagnosis Date  . Atrial fibrillation ([place])   . CAD (coronary artery disease)   . CKD (chronic kidney disease)   . Congestive heart failure ([place])   . COPD (chronic obstructive pulmonary disease) ([place])   . Diabetes ([place])   . Hypertension   . OSA on CPAP     Past Surgical History:  Procedure Laterality Date  . CLAVICLE SURGERY    . heart bypass    . HERNIA REPAIR      Family History  Problem Relation Age of Onset  . Stroke Unknown   . Diabetes Unknown   . Breast cancer Unknown   . Colon cancer Unknown   No bleeding disorders, clotting disorders, autoimmune diseases, or aneurysms  Social History Social History  Substance Use Topics  . Smoking status: Former Smoker    Packs/day: 1.00    Years: 30.00    Types: Cigarettes  . Smokeless tobacco: Never Used     Comment: quit 12/14/2001  . Alcohol use No  Married  Allergies  Allergen Reactions  . Hydralazine Other (See Comments)    tongue swollen and couldn't wake up ---- not positive it was this or a mix of this with something else or high sugar  . Penicillins Other (See Comments)  Passed out (at 76 yrs old) Has patient had a PCN reaction causing immediate rash, facial/tongue/throat swelling, SOB or lightheadedness with hypotension: No Has patient had a PCN reaction causing severe rash involving mucus membranes or skin necrosis: No Has patient had a PCN reaction that required hospitalization No Has patient had a PCN reaction occurring within the last 10 years: No If all of the above answers are "NO", then may proceed with Cephalosporin use.   . Codeine Hives, Rash and Swelling    Current Outpatient Prescriptions  Medication Sig Dispense Refill  . albuterol (PROVENTIL HFA;VENTOLIN HFA) 108 (90 BASE) MCG/ACT inhaler Inhale 2 puffs into the lungs every 4 (four) hours as needed for wheezing or shortness of breath. 1 Inhaler 2  . amLODipine (NORVASC) 5 MG tablet  Take 5 mg by mouth daily.     Marland Kitchen apixaban (ELIQUIS) 5 MG TABS tablet Take 5 mg by mouth 2 (two) times daily.    Marland Kitchen aspirin EC 81 MG tablet Take 81 mg by mouth daily.    . citalopram (CELEXA) 20 MG tablet Take 20 mg by mouth at bedtime.   5  . CRANBERRY PO Take 820 mg by mouth daily.    . cyanocobalamin 500 MCG tablet Take 500 mcg by mouth daily.    . diazepam (VALIUM) 5 MG tablet Take 5 mg by mouth every 12 (twelve) hours as needed for anxiety.     . ergocalciferol (VITAMIN D2) 50000 units capsule Take 50,000 Units by mouth every 30 (thirty) days.    . Fluticasone-Salmeterol (ADVAIR DISKUS) 500-50 MCG/DOSE AEPB Inhale 1 puff into the lungs 2 (two) times daily. 60 each 5  . furosemide (LASIX) 20 MG tablet Take 20 mg by mouth daily. Take one tablet daily.  Can take extra tablet in afternoon if increase weight gain of 3 pounds or increased swelling.    . insulin glargine (LANTUS) 100 UNIT/ML injection Inject 70 Units into the skin at bedtime.    . insulin lispro (HUMALOG) 100 UNIT/ML injection Inject 15-25 Units into the skin 3 (three) times daily before meals. 15 units before breakfast, 25 units before lunch, and 25 units before supper plus additional units for sliding scale    . ketoconazole (NIZORAL) 2 % shampoo Apply 1 application topically 2 (two) times a week.    Marland Kitchen lisinopril (PRINIVIL,ZESTRIL) 5 MG tablet Take 5 mg by mouth daily.    . metFORMIN (GLUCOPHAGE) 500 MG tablet Take 1,000 mg by mouth every evening.     . metoprolol (LOPRESSOR) 100 MG tablet Take 100 mg by mouth 2 (two) times daily.  0  . OMEGA-3 FATTY ACIDS-VITAMIN E PO Take 1 capsule by mouth daily.    . pravastatin (PRAVACHOL) 20 MG tablet Take 20 mg by mouth at bedtime.  3  . tiotropium (SPIRIVA) 18 MCG inhalation capsule Place 18 mcg into inhaler and inhale daily.    . vitamin C (ASCORBIC ACID) 500 MG tablet Take 500 mg by mouth daily.     No current facility-administered medications for this visit.       REVIEW OF SYSTEMS  (Negative unless checked)  Constitutional: [] Weight loss  [] Fever  [] Chills Cardiac: [] Chest pain   [] Chest pressure   [x] Palpitations   [] Shortness of breath when laying flat   [] Shortness of breath at rest   [x] Shortness of breath with exertion. Vascular:  [x] Pain in legs with walking   [] Pain in legs at rest   [] Pain in legs when laying flat   [x] Claudication   []   Pain in feet when walking  [] Pain in feet at rest  [] Pain in feet when laying flat   [] History of DVT   [] Phlebitis   [x] Swelling in legs   [] Varicose veins   [x] Non-healing ulcers Pulmonary:   [x] Uses home oxygen   [] Productive cough   [] Hemoptysis   [] Wheeze  [x] COPD   [] Asthma Neurologic:  [] Dizziness  [] Blackouts   [] Seizures   [] History of stroke   [] History of TIA  [] Aphasia   [] Temporary blindness   [] Dysphagia   [] Weakness or numbness in arms   [x] Weakness or numbness in legs Musculoskeletal:  [x] Arthritis   [] Joint swelling   [] Joint pain   [] Low back pain Hematologic:  [] Easy bruising  [] Easy bleeding   [] Hypercoagulable state   [] Anemic  [] Hepatitis Gastrointestinal:  [] Blood in stool   [] Vomiting blood  [] Gastroesophageal reflux/heartburn   [] Abdominal pain Genitourinary:  [x] Chronic kidney disease   [] Difficult urination  [] Frequent urination  [] Burning with urination   [] Hematuria Skin:  [] Rashes   [x] Ulcers   [x] Wounds Psychological:  [] History of anxiety   []  History of major depression.    Physical Exam BP (!) 114/56 (BP Location: Right Arm)   Pulse 62   Resp 16   Ht 6' (1.829 m)   Wt 101.2 kg (223 lb)   BMI 30.24 kg/m  Gen:  WD/WN, NAD Head: [place]/AT, No temporalis wasting.  Ear/Nose/Throat: Hearing grossly intact, nares w/o erythema or drainage, oropharynx w/o Erythema/Exudate Eyes: Conjunctiva clear, sclera non-icteric  Neck: trachea midline.  No bruit or JVD.  Pulmonary:  Good air movement, clear to auscultation bilaterally although mildly diminished.  Cardiac: irregular Vascular:  Vessel Right Left    Radial  not palpable  not palpable                          PT  not palpable  not palpable  DP  not palpable  not palpable   Gastrointestinal: soft, non-tender/non-distended.  Musculoskeletal: M/S 5/5 throughout.  Sluggish capillary refill although the feet are warm.  Several 2-3 cm circular ulcerations on the right anterior and lateral lower leg.  Mild surrounding erythema. Neurologic: Sensation grossly intact in extremities.  Symmetrical.  Speech is fluent. Motor exam as listed above. Psychiatric: Judgment intact, Mood & affect appropriate for pt's clinical situation. Dermatologic:  right lower leg wounds as described above  Radiology No results found.  Labs No results found for this or any previous visit (from the past 2160 hour(s)).  Assessment/Plan:  Atrial flutter (HCC) On anticoagulation  CHF (congestive heart failure) (HCC) Has chronic reduced ejection fraction of about 35-40% per the wife.  Certainly would be high risk for open surgical therapy.  HTN (hypertension) blood pressure control important in reducing the progression of atherosclerotic disease. On appropriate oral medications.   Type 2 diabetes mellitus (HCC) blood glucose control important in reducing the progression of atherosclerotic disease. Also, involved in wound healing. On appropriate medications.   Atherosclerosis of native arteries of the extremities with ulceration ([place]) The patient has a long-standing history of severe peripheral vascular disease with worsening right lower extremity claudication.  This is now become a more critical and limb threatening situation because of the nonhealing ulceration.  He has previously been told he is not a candidate for revascularization, and that may be the case but I think a more thorough evaluation should be given.  CT angiogram is not an option with renal function, but we will get some arterial  duplex of both the aortoiliac segments and the infrainguinal  segments.  I would also get ABIs.  I discussed the serious nature of the situation and the situation the patient and his wife voiced their understanding and are agreeable with our plan of care.  CKD (chronic kidney disease) We will need to limit contrast with any intervention, and this probably precludes our ability to get a CT scan.      Leotis Pain 03/20/2017, 11:17 AM   This note was created with Dragon medical transcription system.  Any errors from dictation are unintentional.

## 2017-03-20 NOTE — Patient Instructions (Signed)

## 2017-03-20 NOTE — Assessment & Plan Note (Signed)
On anticoagulation 

## 2017-03-20 NOTE — Assessment & Plan Note (Signed)
blood pressure control important in reducing the progression of atherosclerotic disease. On appropriate oral medications.  

## 2017-03-20 NOTE — Assessment & Plan Note (Signed)
The patient has a long-standing history of severe peripheral vascular disease with worsening right lower extremity claudication.  This is now become a more critical and limb threatening situation because of the nonhealing ulceration.  He has previously been told he is not a candidate for revascularization, and that may be the case but I think a more thorough evaluation should be given.  CT angiogram is not an option with renal function, but we will get some arterial duplex of both the aortoiliac segments and the infrainguinal segments.  I would also get ABIs.  I discussed the serious nature of the situation and the situation the patient and his wife voiced their understanding and are agreeable with our plan of care.

## 2017-03-20 NOTE — Assessment & Plan Note (Signed)
blood glucose control important in reducing the progression of atherosclerotic disease. Also, involved in wound healing. On appropriate medications.  

## 2017-04-03 ENCOUNTER — Ambulatory Visit: Payer: Medicare Other | Admitting: Family

## 2017-05-08 ENCOUNTER — Ambulatory Visit (INDEPENDENT_AMBULATORY_CARE_PROVIDER_SITE_OTHER): Payer: Medicare Other

## 2017-05-08 ENCOUNTER — Ambulatory Visit (INDEPENDENT_AMBULATORY_CARE_PROVIDER_SITE_OTHER): Payer: Medicare Other | Admitting: Vascular Surgery

## 2017-05-08 ENCOUNTER — Encounter (INDEPENDENT_AMBULATORY_CARE_PROVIDER_SITE_OTHER): Payer: Self-pay | Admitting: Vascular Surgery

## 2017-05-08 VITALS — BP 119/62 | HR 59 | Resp 16 | Ht 72.0 in | Wt 221.0 lb

## 2017-05-08 DIAGNOSIS — N183 Chronic kidney disease, stage 3 unspecified: Secondary | ICD-10-CM

## 2017-05-08 DIAGNOSIS — I70219 Atherosclerosis of native arteries of extremities with intermittent claudication, unspecified extremity: Secondary | ICD-10-CM | POA: Insufficient documentation

## 2017-05-08 DIAGNOSIS — I70213 Atherosclerosis of native arteries of extremities with intermittent claudication, bilateral legs: Secondary | ICD-10-CM

## 2017-05-08 DIAGNOSIS — I7025 Atherosclerosis of native arteries of other extremities with ulceration: Secondary | ICD-10-CM

## 2017-05-08 DIAGNOSIS — E1122 Type 2 diabetes mellitus with diabetic chronic kidney disease: Secondary | ICD-10-CM

## 2017-05-08 DIAGNOSIS — N189 Chronic kidney disease, unspecified: Secondary | ICD-10-CM | POA: Diagnosis not present

## 2017-05-08 DIAGNOSIS — I10 Essential (primary) hypertension: Secondary | ICD-10-CM

## 2017-05-08 DIAGNOSIS — I4892 Unspecified atrial flutter: Secondary | ICD-10-CM

## 2017-05-08 DIAGNOSIS — Z794 Long term (current) use of insulin: Secondary | ICD-10-CM

## 2017-05-08 NOTE — Progress Notes (Signed)
MRN : [id number]  Ernest Mclean is a 76 y.o. (05/04/1941) male who presents with chief complaint of  Chief Complaint  Patient presents with  . Follow-up    Follow up ABI,Arterial  .  History of Present Illness: Patient returns today in follow up of his PAD.  He continues to have claudication symptoms which are moderate and somewhat lifestyle limiting.  These have not significantly progressed from his previous visit.  He denies any new ulceration or infection.  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  A CT scan could not be performed secondary to his renal insufficiency.  His right ABI is moderately stent 0.60.  His left ABI is surprisingly well-maintained at 0.97.  Past Medical History:  Diagnosis Date  . Atrial fibrillation ([place])   . CAD (coronary artery disease)   . CKD (chronic kidney disease)   . Congestive heart failure ([place])   . COPD (chronic obstructive pulmonary disease) ([place])   . Diabetes ([place])   . Hypertension   . OSA on CPAP          Past Surgical History:  Procedure Laterality Date  . CLAVICLE SURGERY    . heart bypass    . HERNIA REPAIR           Family History  Problem Relation Age of Onset  . Stroke Unknown   . Diabetes Unknown   . Breast cancer Unknown   . Colon cancer Unknown   No bleeding disorders, clotting disorders, autoimmune diseases, or aneurysms  Social History       Social History  Substance Use Topics  . Smoking status: Former Smoker    Packs/day: 1.00    Years: 30.00    Types: Cigarettes  . Smokeless tobacco: Never Used     Comment: quit 12/14/2001  . Alcohol use No  Married       Allergies  Allergen Reactions  . Hydralazine Other (See Comments)    tongue swollen and couldn't wake up ---- not positive it was this or a mix of this with something else or high sugar  . Penicillins Other (See Comments)    Passed out (at 76 yrs  old) Has patient had a PCN reaction causing immediate rash, facial/tongue/throat swelling, SOB or lightheadedness with hypotension: No Has patient had a PCN reaction causing severe rash involving mucus membranes or skin necrosis: No Has patient had a PCN reaction that required hospitalization No Has patient had a PCN reaction occurring within the last 10 years: No If all of the above answers are "NO", then may proceed with Cephalosporin use.   . Codeine Hives, Rash and Swelling          Current Outpatient Prescriptions  Medication Sig Dispense Refill  . albuterol (PROVENTIL HFA;VENTOLIN HFA) 108 (90 BASE) MCG/ACT inhaler Inhale 2 puffs into the lungs every 4 (four) hours as needed for wheezing or shortness of breath. 1 Inhaler 2  . amLODipine (NORVASC) 5 MG tablet Take 5 mg by mouth daily.     Marland Kitchen apixaban (ELIQUIS) 5 MG TABS tablet Take 5 mg by mouth 2 (two) times daily.    Marland Kitchen aspirin EC 81 MG tablet Take 81 mg by mouth daily.    . citalopram (CELEXA) 20 MG tablet Take 20 mg by mouth at bedtime.   5  . CRANBERRY PO Take 820 mg by mouth daily.    . cyanocobalamin 500 MCG tablet Take 500 mcg by mouth  daily.    . diazepam (VALIUM) 5 MG tablet Take 5 mg by mouth every 12 (twelve) hours as needed for anxiety.     . ergocalciferol (VITAMIN D2) 50000 units capsule Take 50,000 Units by mouth every 30 (thirty) days.    . Fluticasone-Salmeterol (ADVAIR DISKUS) 500-50 MCG/DOSE AEPB Inhale 1 puff into the lungs 2 (two) times daily. 60 each 5  . furosemide (LASIX) 20 MG tablet Take 20 mg by mouth daily. Take one tablet daily.  Can take extra tablet in afternoon if increase weight gain of 3 pounds or increased swelling.    . insulin glargine (LANTUS) 100 UNIT/ML injection Inject 70 Units into the skin at bedtime.    . insulin lispro (HUMALOG) 100 UNIT/ML injection Inject 15-25 Units into the skin 3 (three) times daily before meals. 15 units before breakfast, 25 units before lunch, and  25 units before supper plus additional units for sliding scale    . ketoconazole (NIZORAL) 2 % shampoo Apply 1 application topically 2 (two) times a week.    Marland Kitchen lisinopril (PRINIVIL,ZESTRIL) 5 MG tablet Take 5 mg by mouth daily.    . metFORMIN (GLUCOPHAGE) 500 MG tablet Take 1,000 mg by mouth every evening.     . metoprolol (LOPRESSOR) 100 MG tablet Take 100 mg by mouth 2 (two) times daily.  0  . OMEGA-3 FATTY ACIDS-VITAMIN E PO Take 1 capsule by mouth daily.    . pravastatin (PRAVACHOL) 20 MG tablet Take 20 mg by mouth at bedtime.  3  . tiotropium (SPIRIVA) 18 MCG inhalation capsule Place 18 mcg into inhaler and inhale daily.    . vitamin C (ASCORBIC ACID) 500 MG tablet Take 500 mg by mouth daily.     No current facility-administered medications for this visit.       REVIEW OF SYSTEMS (Negative unless checked)  Constitutional: [] Weight loss  [] Fever  [] Chills Cardiac: [] Chest pain   [] Chest pressure   [x] Palpitations   [] Shortness of breath when laying flat   [] Shortness of breath at rest   [x] Shortness of breath with exertion. Vascular:  [x] Pain in legs with walking   [] Pain in legs at rest   [] Pain in legs when laying flat   [x] Claudication   [] Pain in feet when walking  [] Pain in feet at rest  [] Pain in feet when laying flat   [] History of DVT   [] Phlebitis   [x] Swelling in legs   [] Varicose veins   [x] Non-healing ulcers Pulmonary:   [x] Uses home oxygen   [] Productive cough   [] Hemoptysis   [] Wheeze  [x] COPD   [] Asthma Neurologic:  [] Dizziness  [] Blackouts   [] Seizures   [] History of stroke   [] History of TIA  [] Aphasia   [] Temporary blindness   [] Dysphagia   [] Weakness or numbness in arms   [x] Weakness or numbness in legs Musculoskeletal:  [x] Arthritis   [] Joint swelling   [] Joint pain   [] Low back pain Hematologic:  [] Easy bruising  [] Easy bleeding   [] Hypercoagulable state   [] Anemic  [] Hepatitis Gastrointestinal:  [] Blood in stool   [] Vomiting blood   [] Gastroesophageal reflux/heartburn   [] Abdominal pain Genitourinary:  [x] Chronic kidney disease   [] Difficult urination  [] Frequent urination  [] Burning with urination   [] Hematuria Skin:  [] Rashes   [x] Ulcers   [x] Wounds Psychological:  [] History of anxiety   []  History of major depression.       Physical Examination  BP 119/62 (BP Location: Right Arm, Patient Position: Sitting)   Pulse (!) 59   Resp  16   Ht 6' (1.829 m)   Wt 100.2 kg (221 lb)   BMI 29.97 kg/m  Gen:  WD/WN, NAD Head: [place]/AT, No temporalis wasting. Ear/Nose/Throat: Hearing grossly intact, nares w/o erythema or drainage, trachea midline Eyes: Conjunctiva clear. Sclera non-icteric Neck: Supple.  No JVD.  Pulmonary:  Good air movement, no use of accessory muscles.  Cardiac: RRR, normal S1, S2 Vascular:  Vessel Right Left  Radial Palpable Palpable                          PT  not palpable  1+ palpable  DP  trace palpable  trace palpable    Musculoskeletal: M/S 5/5 throughout.  No deformity or atrophy.  His lower extremity edema is minimal today. Neurologic: Sensation grossly intact in extremities.  Symmetrical.  Speech is fluent.  Psychiatric: Judgment intact, Mood & affect appropriate for pt's clinical situation. Dermatologic: No rashes or ulcers noted.  No cellulitis or open wounds.       Labs Recent Results (from the past 2160 hour(s))  VAS [place] LOWER EXTREMITY ARTERIAL DUPLEX     Status: None (In process)   Collection Time: 05/08/17  9:18 AM  Result Value Ref Range   Right super femoral prox sys PSV 52 cm/s   Right super femoral mid sys PSV 45 cm/s   Right super femoral dist sys PSV -41 cm/s   Right popliteal prox sys PSV 26 cm/s   Right popliteal dist sys PSV 27 cm/s   Right peroneal sys PSV -36 cm/s   RIGHT ANT DIST TIBAL SYS PSV 27 cm/s   RIGHT POST TIB DIST SYS 21 cm/s    Radiology No results found.    Assessment/Plan Atrial flutter (HCC) On anticoagulation  CHF  (congestive heart failure) (HCC) Has chronic reduced ejection fraction of about 35-40% per the wife.  Certainly would be high risk for open surgical therapy.  HTN (hypertension) blood pressure control important in reducing the progression of atherosclerotic disease. On appropriate oral medications.   Type 2 diabetes mellitus (HCC) blood glucose control important in reducing the progression of atherosclerotic disease. Also, involved in wound healing. On appropriate medications.   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  A CT scan could not be performed secondary to his renal insufficiency.  His right ABI is moderately stent 0.60.  His left ABI is surprisingly well-maintained at 0.97. 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  He does not appear to have any immediate limb threatening symptoms.  Although his perfusion is significantly reduced, I think continued conservative management would be a reasonable option as long as his symptoms remain tolerable.  He says it current they are.  I discussed that it would likely be an aortobifemoral bypass and I would likely need to perform an angiogram from his arm ablation before considering any surgery.  At this time, he would like to continue his current medical regimen and try to walk more.  I will see him back in 6 months with noninvasive studies.    Leotis Pain, MD  05/08/2017 1:16 PM    This note was created with Dragon medical transcription system.  Any errors from dictation are purely unintentional

## 2017-05-08 NOTE — Patient Instructions (Signed)

## 2017-05-08 NOTE — Assessment & Plan Note (Signed)
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  A CT scan could not be performed secondary to his renal insufficiency.  His right ABI is moderately stent 0.60.  His left ABI is surprisingly well-maintained at 0.97. 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  He does not appear to have any immediate limb threatening symptoms.  Although his perfusion is significantly reduced, I think continued conservative management would be a reasonable option as long as his symptoms remain tolerable.  He says it current they are.  I discussed that it would likely be an aortobifemoral bypass and I would likely need to perform an angiogram from his arm ablation before considering any surgery.  At this time, he would like to continue his current medical regimen and try to walk more.  I will see him back in 6 months with noninvasive studies.

## 2017-05-17 ENCOUNTER — Encounter (INDEPENDENT_AMBULATORY_CARE_PROVIDER_SITE_OTHER): Payer: Self-pay | Admitting: Vascular Surgery

## 2017-06-06 ENCOUNTER — Encounter (INDEPENDENT_AMBULATORY_CARE_PROVIDER_SITE_OTHER): Payer: Self-pay | Admitting: Vascular Surgery

## 2017-06-13 ENCOUNTER — Encounter (INDEPENDENT_AMBULATORY_CARE_PROVIDER_SITE_OTHER): Payer: Self-pay | Admitting: Vascular Surgery

## 2017-06-22 DIAGNOSIS — C4491 Basal cell carcinoma of skin, unspecified: Secondary | ICD-10-CM

## 2017-06-22 DIAGNOSIS — C4442 Squamous cell carcinoma of skin of scalp and neck: Secondary | ICD-10-CM | POA: Insufficient documentation

## 2017-06-22 DIAGNOSIS — IMO0002 Reserved for concepts with insufficient information to code with codable children: Secondary | ICD-10-CM

## 2017-06-22 HISTORY — DX: Reserved for concepts with insufficient information to code with codable children: IMO0002

## 2017-06-22 HISTORY — DX: Basal cell carcinoma of skin, unspecified: C44.91

## 2017-07-10 HISTORY — PX: MOHS SURGERY: SHX181

## 2017-07-13 NOTE — Progress Notes (Signed)
Patient ID: Ernest Mclean, male    DOB: Jun 18, 1940, 77 y.o.   MRN: [id number]  HPI  [name] is a 77 y/o male with a history of Atrial fibrillation, CAD, CKD, COPD, diabetes, HTN, PVD, PAD, obstructive sleep apnea (on CPAP), remote tobacco use and chronic HF with preserved EF.  Echo done 08/02/16 reviewed and showed an EF of 60-65% along with mild MR/TR. Previous echo was done 07/09/15 which showed an EF of 45-50% with trivial MR. No aortic stenosis. EF is down from June 2016 when it was >55%. Last PFT's done September 2016 with recent 6 minute walk test completed by pulmonology on 02/02/16.   Has not been admitted or been in the ED in the last 6 months.   He presents today for a follow-up visit with a chief complaint of minimal fatigue upon moderate exertion. He describes this as chronic in nature having been present for many years with varying levels of severity. He has associated shortness of breath, anxiety and lower leg pain along with this. He denies any abdominal distention, chest pain, edema, palpitations, dizziness or weight gain. Recently had Mohs' procedure on skin cancer on the top of his scalp.   Past Medical History:  Diagnosis Date  . Atrial fibrillation ([place])   . CAD (coronary artery disease)   . CKD (chronic kidney disease)   . Congestive heart failure ([place])   . COPD (chronic obstructive pulmonary disease) ([place])   . Diabetes ([place])   . Hypertension   . OSA on CPAP    Past Surgical History:  Procedure Laterality Date  . CLAVICLE SURGERY    . heart bypass    . HERNIA REPAIR     Family History  Problem Relation Age of Onset  . Stroke Unknown   . Diabetes Unknown   . Breast cancer Unknown   . Colon cancer Unknown    Social History   Tobacco Use  . Smoking status: Former Smoker    Packs/day: 1.00    Years: 30.00    Pack years: 30.00    Types: Cigarettes  . Smokeless tobacco: Never Used  . Tobacco comment: quit 12/14/2001  Substance Use Topics  . Alcohol use: No     Alcohol/week: 0.0 oz   Allergies  Allergen Reactions  . Hydralazine Other (See Comments)    tongue swollen and couldn't wake up ---- not positive it was this or a mix of this with something else or high sugar  . Penicillins Other (See Comments)    Passed out (at 77 yrs old) Has patient had a PCN reaction causing immediate rash, facial/tongue/throat swelling, SOB or lightheadedness with hypotension: No Has patient had a PCN reaction causing severe rash involving mucus membranes or skin necrosis: No Has patient had a PCN reaction that required hospitalization No Has patient had a PCN reaction occurring within the last 10 years: No If all of the above answers are "NO", then may proceed with Cephalosporin use.   . Codeine Hives, Rash and Swelling   Prior to Admission medications   Medication Sig Start Date End Date Taking? Authorizing Provider  albuterol (PROVENTIL HFA;VENTOLIN HFA) 108 (90 BASE) MCG/ACT inhaler Inhale 2 puffs into the lungs every 4 (four) hours as needed for wheezing or shortness of breath. 10/29/14  Yes Mungal, Vishal, MD  amLODipine (NORVASC) 5 MG tablet Take 5 mg by mouth daily.  08/17/14  Yes [provider]  ammonium lactate (AMLACTIN) 12 % cream Apply 1 g topically as  needed for dry skin.   Yes [provider]  apixaban (ELIQUIS) 5 MG TABS tablet Take 5 mg by mouth 2 (two) times daily. 08/17/14  Yes [provider]  aspirin EC 81 MG tablet Take 81 mg by mouth daily. 08/17/14  Yes [provider]  citalopram (CELEXA) 20 MG tablet Take 20 mg by mouth at bedtime.  07/30/14  Yes [provider]  CRANBERRY PO Take 820 mg by mouth daily.   Yes [provider]  cyanocobalamin 500 MCG tablet Take 500 mcg by mouth daily.   Yes [provider]  desonide (DESOWEN) 0.05 % cream Apply topically. 05/10/16  Yes [provider]  diazepam (VALIUM) 5 MG tablet Take 5 mg by mouth every 12 (twelve) hours as needed for  anxiety.  10/24/13  Yes [provider]  ergocalciferol (VITAMIN D2) 50000 units capsule Take 50,000 Units by mouth every 30 (thirty) days.   Yes [provider]  Fluticasone-Salmeterol (ADVAIR DISKUS) 500-50 MCG/DOSE AEPB Inhale 1 puff into the lungs 2 (two) times daily. 03/13/17  Yes Flora Lipps, MD  furosemide (LASIX) 20 MG tablet Take 20 mg by mouth daily. Take one tablet daily.  Can take extra tablet in afternoon if increase weight gain of 3 pounds or increased swelling.   Yes [provider]  insulin glargine (LANTUS) 100 UNIT/ML injection Inject 70 Units into the skin at bedtime.   Yes [provider]  insulin lispro (HUMALOG) 100 UNIT/ML injection Inject 15-25 Units into the skin 3 (three) times daily before meals. 15 units before breakfast, 25 units before lunch, and 25 units before supper plus additional units for sliding scale   Yes [provider]  ketoconazole (NIZORAL) 2 % shampoo Apply 1 application topically 2 (two) times a week. 05/11/16  Yes [provider]  lisinopril (PRINIVIL,ZESTRIL) 5 MG tablet Take 5 mg by mouth daily.   Yes [provider]  metFORMIN (GLUCOPHAGE) 500 MG tablet Take 1,000 mg by mouth every evening.    Yes [provider]  metoprolol (LOPRESSOR) 100 MG tablet Take 100 mg by mouth 2 (two) times daily. 06/17/14  Yes [provider]  OMEGA-3 FATTY ACIDS-VITAMIN E PO Take 1 capsule by mouth daily.   Yes [provider]  pravastatin (PRAVACHOL) 20 MG tablet Take 20 mg by mouth at bedtime. 07/31/14  Yes [provider]  tiotropium (SPIRIVA) 18 MCG inhalation capsule Place 18 mcg into inhaler and inhale daily. 07/31/14  Yes [provider]  vitamin C (ASCORBIC ACID) 500 MG tablet Take 500 mg by mouth daily.   Yes [provider]    Review of Systems  Constitutional: Positive for fatigue (with overexertion). Negative for appetite change.  HENT: Positive  for voice change (raspy voice). Negative for congestion and postnasal drip.   Eyes: Negative.   Respiratory: Positive for shortness of breath. Negative for cough and chest tightness.   Cardiovascular: Negative for chest pain, palpitations and leg swelling.  Gastrointestinal: Negative for abdominal distention and abdominal pain.  Endocrine: Negative.   Genitourinary: Positive for dysuria.  Musculoskeletal: Positive for arthralgias (right calf hurts when walking >15 minutes). Negative for back pain and neck pain.  Skin: Negative.   Allergic/Immunologic: Negative.   Neurological: Negative for dizziness and light-headedness.  Hematological: Negative for adenopathy. Does not bruise/bleed easily.  Psychiatric/Behavioral: Negative for dysphoric mood and sleep disturbance (wearing oxygen and CPAP at bedtime). The patient is nervous/anxious.    Vitals:   07/16/17 1303  BP: (!) 132/38  Pulse: 61  Resp: 18  SpO2: 100%  Weight: 225 lb 2 oz (102.1 kg)  Height: 6' (1.829 m)   Wt Readings from Last 3 Encounters:  07/16/17 225 lb 2 oz (102.1 kg)  05/08/17 221 lb (100.2 kg)  03/20/17 223 lb (101.2 kg)   Lab Results  Component Value Date   CREATININE 1.64 (H) 08/02/2016   CREATININE 1.16 08/01/2016   CREATININE 1.76 (H) 05/24/2016    Physical Exam  Constitutional: He is oriented to person, place, and time. He appears well-developed and well-nourished.  HENT:  Head: Normocephalic and atraumatic.  Neck: Normal range of motion. Neck supple. No JVD present.  Cardiovascular: Normal rate and regular rhythm.  Pulmonary/Chest: Effort normal. He has no wheezes. He has no rales.  Abdominal: Soft. He exhibits no distension. There is no tenderness.  Musculoskeletal: He exhibits edema (trace edema in bilateral lower legs). He exhibits no tenderness.  Neurological: He is alert and oriented to person, place, and time.  Skin: Skin is warm and dry.  Psychiatric: He has a normal mood and affect. His  behavior is normal. Thought content normal.  Nursing note and vitals reviewed.   Assessment & Plan:  1: Chronic heart failure with preserved ejection fraction- - NYHA Class II - Euvolemic today - Continue daily weights with taking additional furosemide for overnight weight gain of >2 pounds/weekly weight gain of >5 pounds - weight up 4 pounds since he was last here - Continue daily walking with rest as needed for calf pain; encouraged to wear his oxygen when walking - not adding salt and patient's wife diligently reads food labels for sodium content - Last saw cardiologist Ernest Mclean)  02/13/17 & returns March 2019 - patient reports receiving his flu vaccine for the season  2: HTN- -  DBP on the low side today but currently without dizziness; continue to monitor at home - Saw PCP Ernest Mclean) on 06/01/17 - BMP from 02/06/17 reviewed and shows sodium 130, potassium 4.5 and GFR 40  3: Obstructive sleep apnea- - Wearing CPAP on a nightly basis - Wears oxygen at 2L [facility]  4: Diabetes- - nonfasting glucose at home today was 138 - saw endocrinologist (Ernest Mclean) 05/30/17 - saw nephrologist Ernest Mclean) recently  Patient did not bring his medications nor a list. Each medication was verbally reviewed with the patient and he was encouraged to bring the bottles to every visit to confirm accuracy of list.  Return in 4 months or sooner for any questions/problems before then.

## 2017-07-16 ENCOUNTER — Ambulatory Visit: Payer: Medicare Other | Attending: Family | Admitting: Family

## 2017-07-16 ENCOUNTER — Encounter: Payer: Self-pay | Admitting: Family

## 2017-07-16 VITALS — BP 132/38 | HR 61 | Resp 18 | Ht 72.0 in | Wt 225.1 lb

## 2017-07-16 DIAGNOSIS — Z79899 Other long term (current) drug therapy: Secondary | ICD-10-CM | POA: Insufficient documentation

## 2017-07-16 DIAGNOSIS — I5032 Chronic diastolic (congestive) heart failure: Secondary | ICD-10-CM

## 2017-07-16 DIAGNOSIS — Z7982 Long term (current) use of aspirin: Secondary | ICD-10-CM | POA: Diagnosis not present

## 2017-07-16 DIAGNOSIS — Z794 Long term (current) use of insulin: Secondary | ICD-10-CM | POA: Insufficient documentation

## 2017-07-16 DIAGNOSIS — Z880 Allergy status to penicillin: Secondary | ICD-10-CM | POA: Insufficient documentation

## 2017-07-16 DIAGNOSIS — G4733 Obstructive sleep apnea (adult) (pediatric): Secondary | ICD-10-CM | POA: Diagnosis not present

## 2017-07-16 DIAGNOSIS — I509 Heart failure, unspecified: Secondary | ICD-10-CM | POA: Insufficient documentation

## 2017-07-16 DIAGNOSIS — J449 Chronic obstructive pulmonary disease, unspecified: Secondary | ICD-10-CM | POA: Insufficient documentation

## 2017-07-16 DIAGNOSIS — Z885 Allergy status to narcotic agent status: Secondary | ICD-10-CM | POA: Diagnosis not present

## 2017-07-16 DIAGNOSIS — E1122 Type 2 diabetes mellitus with diabetic chronic kidney disease: Secondary | ICD-10-CM | POA: Diagnosis not present

## 2017-07-16 DIAGNOSIS — I130 Hypertensive heart and chronic kidney disease with heart failure and stage 1 through stage 4 chronic kidney disease, or unspecified chronic kidney disease: Secondary | ICD-10-CM | POA: Diagnosis not present

## 2017-07-16 DIAGNOSIS — Z87891 Personal history of nicotine dependence: Secondary | ICD-10-CM | POA: Insufficient documentation

## 2017-07-16 DIAGNOSIS — N189 Chronic kidney disease, unspecified: Secondary | ICD-10-CM | POA: Diagnosis not present

## 2017-07-16 DIAGNOSIS — N183 Chronic kidney disease, stage 3 unspecified: Secondary | ICD-10-CM

## 2017-07-16 DIAGNOSIS — I10 Essential (primary) hypertension: Secondary | ICD-10-CM

## 2017-07-16 DIAGNOSIS — R5383 Other fatigue: Secondary | ICD-10-CM | POA: Diagnosis present

## 2017-07-16 DIAGNOSIS — Z9989 Dependence on other enabling machines and devices: Secondary | ICD-10-CM

## 2017-07-16 NOTE — Patient Instructions (Signed)
Continue weighing daily and call for an overnight weight gain of > 2 pounds or a weekly weight gain of >5 pounds. 

## 2017-09-08 ENCOUNTER — Other Ambulatory Visit: Payer: Self-pay | Admitting: Internal Medicine

## 2017-09-20 ENCOUNTER — Encounter: Payer: Self-pay | Admitting: Internal Medicine

## 2017-09-20 ENCOUNTER — Ambulatory Visit: Payer: Medicare Other | Admitting: Internal Medicine

## 2017-09-20 VITALS — BP 126/60 | HR 60 | Resp 16 | Ht 72.0 in | Wt 227.0 lb

## 2017-09-20 DIAGNOSIS — J449 Chronic obstructive pulmonary disease, unspecified: Secondary | ICD-10-CM | POA: Diagnosis not present

## 2017-09-20 DIAGNOSIS — G4733 Obstructive sleep apnea (adult) (pediatric): Secondary | ICD-10-CM | POA: Diagnosis not present

## 2017-09-20 DIAGNOSIS — J9611 Chronic respiratory failure with hypoxia: Secondary | ICD-10-CM

## 2017-09-20 MED ORDER — ALBUTEROL SULFATE HFA 108 (90 BASE) MCG/ACT IN AERS: 2.0000 | INHALATION_SPRAY | RESPIRATORY_TRACT | 2 refills | Status: DC | PRN

## 2017-09-20 NOTE — Progress Notes (Signed)
Ernest Mclean      MRN# [id number] Ernest Mclean [birth year]  CC: Follow up COPD  Brief History: 77 yo male with COPD, PVD with peripheral claudication, OSA  worst after PNA in 07/2014, now following for COPD and Chronic Hypoxic respiratory failure on 2L o2 (maily for PVD and at night for OSA). Pulmonary nodules on CT, sub cm, mainly due to inflammatory/infectious process.   Events since last clinic visit:aHPI Patient presents today for follow-up visit of his COPD and chronic hypoxic respiratory failure/OSA/nodules At his last visit he's noted to be on 2 L of supplemental oxygen at night Patient states he is uses CPAP daily and compliant He will need to use CPAP with naps as well  Doing incentive spirometer 25 times daily averaging about 2000cc.   follow up CT Chest-, showed some inflammatory nodules consistent with his recent infection and has completely resolved  No acute issues at this time No signs of infection at this time Doing well overall.   Recent admission for acute Afib with RVR and CHF exacerbation doing well now on lasix therapy      Medication:   Current Outpatient Medications:  .  albuterol (PROVENTIL HFA;VENTOLIN HFA) 108 (90 BASE) MCG/ACT inhaler, Inhale 2 puffs into the lungs every 4 (four) hours as needed for wheezing or shortness of breath., Disp: 1 Inhaler, Rfl: 2 .  amLODipine (NORVASC) 5 MG tablet, Take 5 mg by mouth daily. , Disp: , Rfl:  .  ammonium lactate (AMLACTIN) 12 % cream, Apply 1 g topically as needed for dry skin., Disp: , Rfl:  .  apixaban (ELIQUIS) 5 MG TABS tablet, Take 5 mg by mouth 2 (two) times daily., Disp: , Rfl:  .  aspirin EC 81 MG tablet, Take 81 mg by mouth daily., Disp: , Rfl:  .  citalopram (CELEXA) 20 MG tablet, Take 20 mg by mouth at bedtime. , Disp: , Rfl: 5 .  CRANBERRY PO, Take 820 mg by mouth daily., Disp: , Rfl:  .  cyanocobalamin 500 MCG tablet, Take 500 mcg by mouth daily., Disp: , Rfl:  .   desonide (DESOWEN) 0.05 % cream, Apply topically., Disp: , Rfl:  .  diazepam (VALIUM) 5 MG tablet, Take 5 mg by mouth every 12 (twelve) hours as needed for anxiety. , Disp: , Rfl:  .  ergocalciferol (VITAMIN D2) 50000 units capsule, Take 50,000 Units by mouth every 30 (thirty) days., Disp: , Rfl:  .  furosemide (LASIX) 20 MG tablet, Take 20 mg by mouth daily. Take one tablet daily.  Can take extra tablet in afternoon if increase weight gain of 3 pounds or increased swelling., Disp: , Rfl:  .  insulin glargine (LANTUS) 100 UNIT/ML injection, Inject 70 Units into the skin at bedtime., Disp: , Rfl:  .  insulin lispro (HUMALOG) 100 UNIT/ML injection, Inject 15-25 Units into the skin 3 (three) times daily before meals. 15 units before breakfast, 25 units before lunch, and 25 units before supper plus additional units for sliding scale, Disp: , Rfl:  .  ketoconazole (NIZORAL) 2 % shampoo, Apply 1 application topically 2 (two) times a week., Disp: , Rfl:  .  lisinopril (PRINIVIL,ZESTRIL) 5 MG tablet, Take 5 mg by mouth daily., Disp: , Rfl:  .  metFORMIN (GLUCOPHAGE) 500 MG tablet, Take 1,000 mg by mouth every evening. , Disp: , Rfl:  .  metoprolol (LOPRESSOR) 100 MG tablet, Take 100 mg by mouth 2 (two) times daily., Disp: , Rfl: 0 .  OMEGA-3 FATTY ACIDS-VITAMIN E PO, Take 1 capsule by mouth daily., Disp: , Rfl:  .  pravastatin (PRAVACHOL) 20 MG tablet, Take 20 mg by mouth at bedtime., Disp: , Rfl: 3 .  tiotropium (SPIRIVA) 18 MCG inhalation capsule, Place 18 mcg into inhaler and inhale daily., Disp: , Rfl:  .  vitamin C (ASCORBIC ACID) 500 MG tablet, Take 500 mg by mouth daily., Disp: , Rfl:  .  WIXELA INHUB 500-50 MCG/DOSE AEPB, TAKE 1 PUFF BY MOUTH TWICE A DAY, Disp: 60 each, Rfl: 5    Review of Systems  Constitutional: Negative for chills, fever and weight loss.  HENT: Negative for hearing loss.   Eyes: Negative for blurred vision.  Respiratory: Positive for shortness of breath. Negative for cough,  sputum production and wheezing.        Improving SOB, now mostly with exertion  Cardiovascular: Positive for claudication. Negative for chest pain, palpitations and orthopnea.  Gastrointestinal: Negative for heartburn and nausea.  Genitourinary: Negative for dysuria.  Musculoskeletal: Positive for myalgias.       Chronic leg pain with exercise or exertion, has PVD/PAD  Skin: Negative for rash.  Neurological: Negative for headaches.     BP 126/60 (BP Location: Left Arm, Cuff Size: Normal)   Pulse 60   Resp 16   Ht 6' (1.829 m)   Wt 227 lb (103 kg)   SpO2 91%   BMI 30.79 kg/m   Allergies:  Codeine; Hydralazine; and Penicillins  Physical Examination:   General Appearance: No distress  HEENT: PERRLA, no ptosis, no other lesions noticed Pulmonary:normal breath sounds., diaphragmatic excursion normal.No wheezing, No rales   Cardiovascular:  Normal S1,S2.  No m/r/g.     Abdomen:Exam: Benign, Soft, non-tender, No masses  Skin:   warm, no rashes, no ecchymosis  Extremities: normal, no cyanosis, clubbing, warm with normal capillary refill.       6MWT - 02/10/15 - distance: 224m - lowest saturation: 97% - no complaints during walk, no need for supplemental O2 with exertion  6MWT 09/20/2015 - distance 1129ft - lowest sat  95% - History of peripheral vascular disease and claudication, completed 8.5 minutes of 10 minutes of testing, stopped early due to leg pain  PFTs 02/10/15 postBD FEV1 75% postBD FVC 70% FEV1/FVC 77% RV 147 TLC 126 RV/TLC 110 DLCO - unable toperform Impression - decrease FEV1, preserved FEV1/FVC ratio. Mild/Mod obstruction on inhalers, air trapping/hyperinflation noted.  Significant response to BD, >279ml on FVC and FEV1 post BD   EADIOLOGY: (The following images and results were reviewed by Dr. Stevenson Clinch on 09/20/2017). CT Chest 02/15/16 IMPRESSION:CT chest 01/2016 1. Several new pulmonary nodules since the prior chest CT in June 2016. 2. Patchy area of  tree-in-bud appearance in the right upper lobe is likely inflammation or atypical infection. 3. Probable area of rounded atelectasis in the left lower lobe. 4. Stable emphysematous changes. 5. Advanced atherosclerotic calcifications involving the thoracic and abdominal aorta and branch vessels. 6. Scattered mediastinal and hilar lymph nodes. 7. I do not think PET-CT would be helpful as these nodules are too small. Abdominal/pelvic CT may be helpful to exclude malignancy. If this is negative a three-month follow-up chest CT is suggested.   Repeat CT chest 05/2016 IMPRESSION: 1. Scattered small pulmonary nodules, regarded as new on the prior exam, are smaller or resolved on the current exam. 2. Resolved subpleural consolidation/ground-glass in the left lower lobe with residual scarring. 3. Additional pulmonary nodules are unchanged from 07/15/2014 and considered benign. 4. Aortic  atherosclerosis (ICD10-170.0). Coronary artery calcification. 5. Cholelithiasis.  01/2015 PFTs  - preserved FEV1/FVC ratio, airtrapping and hyperinflation, significant response to BD.  6MWt  - 231m, no desats <88%, no need for supplemental O2.   09/2015 58mwt - 31m, lowest sat 95%, completed 8.5 of 8min, stopped early due to leg pain  02/02/2016: 28mwt - 467m/1378ft, lowest sat 97%, highest HR 80. Again on supplemental O2 for PVD with Claudication (given by Cards and Vasc Surg) and OSA with hypoxemia at night.   Assessment and Plan:  77 year old male presents for follow-up visit of Mod COPD gold stage C with CKD,CHF with OSA and chronic hypoxic resp failure with deconditioned state   Solitary pulmonary nodule previous nodules after recent infection have resolved -no new CT scans needed at this time   OSA on CPAP Patient with a known history of OSA on CPAP 10 cm h20 Overnight pulse oximetry showed multiple events (80) occurring between 89 and 85% saturation. Continue with 2 L supplemental oxygen on  CPAP. Plan: -Continue with supplemental oxygen (2 L) with CPAP at night -CPAP compliance discussed with patient, overall doing well.    COPD (chronic obstructive pulmonary disease) (HCC) Seems to be stable at this time Plan: - cont with Advair to 500/50 -- gargle and rinse after each use.  - cont with Spiriva as directed.  - albuterol rescue inhaler - 2puff every 3-4 hours as needed for shortness of breath\wheezing\recurrent cough - may use albuterol rescue inhaler, 2 puffs, 15 mins prior to any exertional activities (lawn care, exercise, etc).  - Oxygen via nasal cannula, 2 L at night,   Deconditioned state -Recommend increased daily activity and exercise   Patient/Family are satisfied with Plan of action and management. All questions answered Follow up in 6 months   Kimra Kantor Patricia Pesa, M.D.  [facility]  Medical Director [place] Director [facility] Cardio-Pulmonary Department

## 2017-09-20 NOTE — Patient Instructions (Signed)
Continue inhalers  as prescribed continue oxygen  Continue CPAP therapy

## 2017-11-12 ENCOUNTER — Ambulatory Visit: Payer: Medicare Other | Admitting: Family

## 2017-11-13 ENCOUNTER — Encounter (INDEPENDENT_AMBULATORY_CARE_PROVIDER_SITE_OTHER): Payer: Medicare Other

## 2017-11-13 ENCOUNTER — Ambulatory Visit (INDEPENDENT_AMBULATORY_CARE_PROVIDER_SITE_OTHER): Payer: Medicare Other | Admitting: Vascular Surgery

## 2017-11-24 ENCOUNTER — Other Ambulatory Visit: Payer: Self-pay | Admitting: Internal Medicine

## 2018-01-04 ENCOUNTER — Encounter

## 2018-01-04 ENCOUNTER — Encounter (INDEPENDENT_AMBULATORY_CARE_PROVIDER_SITE_OTHER): Payer: Self-pay | Admitting: Nurse Practitioner

## 2018-01-04 ENCOUNTER — Ambulatory Visit (INDEPENDENT_AMBULATORY_CARE_PROVIDER_SITE_OTHER): Payer: Medicare Other | Admitting: Nurse Practitioner

## 2018-01-04 ENCOUNTER — Ambulatory Visit (INDEPENDENT_AMBULATORY_CARE_PROVIDER_SITE_OTHER): Payer: Medicare Other

## 2018-01-04 VITALS — BP 123/61 | HR 66 | Resp 16 | Ht 72.0 in | Wt 225.6 lb

## 2018-01-04 DIAGNOSIS — I70213 Atherosclerosis of native arteries of extremities with intermittent claudication, bilateral legs: Secondary | ICD-10-CM

## 2018-01-04 DIAGNOSIS — I10 Essential (primary) hypertension: Secondary | ICD-10-CM | POA: Diagnosis not present

## 2018-01-04 DIAGNOSIS — K219 Gastro-esophageal reflux disease without esophagitis: Secondary | ICD-10-CM

## 2018-01-04 DIAGNOSIS — J439 Emphysema, unspecified: Secondary | ICD-10-CM

## 2018-01-06 ENCOUNTER — Encounter (INDEPENDENT_AMBULATORY_CARE_PROVIDER_SITE_OTHER): Payer: Self-pay | Admitting: Nurse Practitioner

## 2018-01-06 NOTE — Progress Notes (Signed)
Subjective:    Patient ID: Ernest Mclean, male    DOB: January 27, 1941, 77 y.o.   MRN: [id number] Chief Complaint  Patient presents with  . Follow-up    80month ABI    HPI  The patient is seen for evaluation of PAD.  He continues to have claudication symptoms, which are moderate and lifestyle limiting.  The symptoms have not progressed since the last visit. The patient denies rest pain or dangling of an extremity off the side of the bed during the night for relief. No open wounds or sores at this time. No history of DVT or phlebitis. No prior interventions or surgeries.  There is an extensive history of coronary artery disease.  The patient denies any fever, chills, nausea, vomiting, or diarrhea.  Patient denies any recent episodes of chest pain or shortness of breath.  The patient also does not wear his oxygen as often as he should, which may be contributing to some of his claudication-like symptoms.    Today his ABIs were 0.57 on the right lower extremity with a TBI of 0.50, the left lower extremity had an ABI of 0.99 with a TBI of 0.70.  Both ABIs and TBI's appear unchanged relative to the study done on 05/08/2017.    Constitutional: [] Weight loss  [] Fever  [] Chills Cardiac: [] Chest pain   [] Chest pressure   [] Palpitations   [] Shortness of breath when laying flat   [] Shortness of breath with exertion. Vascular:  [x] Pain in legs with walking   [x] Pain in legs with standing  [] History of DVT   [] Phlebitis   [x] Swelling in legs   [] Varicose veins   [] Non-healing ulcers Pulmonary:   [x] Uses home oxygen   [] Productive cough   [] Hemoptysis   [] Wheeze  [x] COPD   [] Asthma Neurologic:  [] Dizziness   [] Seizures   [] History of stroke   [] History of TIA  [] Aphasia   [] Vissual changes   [] Weakness or numbness in arm   [] Weakness or numbness in leg Musculoskeletal:   [] Joint swelling   [] Joint pain   [] Low back pain Hematologic:  [x] Easy bruising [x] Easy bleeding   [] Hypercoagulable state    [] Anemic Gastrointestinal:  [] Diarrhea   [] Vomiting  [] Gastroesophageal reflux/heartburn   [] Difficulty swallowing. Genitourinary:  [] Chronic kidney disease   [] Difficult urination  [] Frequent urination   [] Blood in urine Skin:  [] Rashes   [] Ulcers  Psychological:  [] History of anxiety   []  History of major depression.     Objective:   Physical Exam  BP 123/61 (BP Location: Right Arm)   Pulse 66   Resp 16   Ht 6' (1.829 m)   Wt 225 lb 9.6 oz (102.3 kg)   BMI 30.60 kg/m   Past Medical History:  Diagnosis Date  . Atrial fibrillation ([place])   . Basal cell carcinoma 06/2017   Left Ear  . CAD (coronary artery disease)   . CKD (chronic kidney disease)   . Congestive heart failure ([place])   . COPD (chronic obstructive pulmonary disease) ([place])   . Diabetes ([place])   . Hypertension   . OSA on CPAP   . 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  Ear/Nose/Throat: Hearing grossly intact, nares w/o erythema or drainage Eyes: PER, EOMI, sclera nonicteric.  Neck: Supple, no masses.  No bruit or JVD.  Pulmonary:  Good air movement, clear to auscultation bilaterally, no use of accessory muscles.  Cardiac: irregularly irregular Vascular:  Vessel Right Left  Radial palpable palpable  DP Trace palpable Trace palpable  Gastrointestinal: soft, non-distended. No guarding/no peritoneal signs.  Musculoskeletal: M/S 5/5 throughout.  No deformity or atrophy.  Neurologic: Pain and light touch intact in extremities.  Symmetrical.  Speech is fluent. Motor exam as listed above. Psychiatric: Judgment intact, Mood & affect appropriate for pt's clinical situation. Dermatologic: No Venous rashes no ulcers noted.  No changes consistent with cellulitis. Lymph : No Cervical lymphadenopathy, no lichenification or skin changes of chronic lymphedema.   Social History   Socioeconomic History  . Marital status: Married    Spouse name: Not on file  .  Number of children: Not on file  . Years of education: Not on file  . Highest education level: Not on file  Occupational History  . Not on file  Social Needs  . Financial resource strain: Not on file  . Food insecurity:    Worry: Not on file    Inability: Not on file  . Transportation needs:    Medical: Not on file    Non-medical: Not on file  Tobacco Use  . Smoking status: Former Smoker    Packs/day: 1.00    Years: 30.00    Pack years: 30.00    Types: Cigarettes  . Smokeless tobacco: Never Used  . Tobacco comment: quit 12/14/2001  Substance and Sexual Activity  . Alcohol use: No    Alcohol/week: 0.0 standard drinks  . Drug use: No  . Sexual activity: Not on file  Lifestyle  . Physical activity:    Days per week: Not on file    Minutes per session: Not on file  . Stress: Not on file  Relationships  . Social connections:    Talks on phone: Not on file    Gets together: Not on file    Attends religious service: Not on file    Active member of club or organization: Not on file    Attends meetings of clubs or organizations: Not on file    Relationship status: Not on file  . Intimate partner violence:    Fear of current or ex partner: Not on file    Emotionally abused: Not on file    Physically abused: Not on file    Forced sexual activity: Not on file  Other Topics Concern  . Not on file  Social History Narrative  . Not on file    Past Surgical History:  Procedure Laterality Date  . CLAVICLE SURGERY    . heart bypass    . HERNIA REPAIR    . MOHS SURGERY  07/10/2017   Head    Family History  Problem Relation Age of Onset  . Stroke Unknown   . Diabetes Unknown   . Breast cancer Unknown   . Colon cancer Unknown     Allergies  Allergen Reactions  . Codeine Hives, Rash and Swelling  . Hydralazine Other (See Comments)    tongue swollen and couldn't wake up ---- not positive it was this or a mix of this with something else or high sugar  . Penicillins Other  (See Comments)    Passed out (at 77 yrs old) Has patient had a PCN reaction causing immediate rash, facial/tongue/throat swelling, SOB or lightheadedness with hypotension: No Has patient had a PCN reaction causing severe rash involving mucus membranes or skin necrosis: No Has patient had a PCN reaction that required hospitalization No Has patient had a PCN reaction occurring within the last 10  years: No If all of the above answers are "NO", then may proceed with Cephalosporin use.        Assessment & Plan:   1. Atherosclerosis of native artery of both lower extremities with intermittent claudication (HCC) Today his ABIs were 0.57 on the right lower extremity with a TBI of 0.50, the left lower extremity had an ABI of 0.99 with a TBI of 0.70.  Both ABIs and TBI's appear unchanged relative to the study done on 05/08/2017.  The patient has appeared to build multiple collaterals which appear to be working for him at this time.  The patient wishes to proceed with conservative medical management.  Patient understands that any surgical intervention would be high risk.  We will follow-up with noninvasive studies in 6 months, as well as physical exam.  Currently the patient does not have any limb threatening symptoms, or ulcerations therefore I think that continued conservative management is a good option.  - VAS [place] ABI WITH/WO TBI; Future  2. Essential hypertension Continue antihypertensive medications as already ordered, these medications have been reviewed and there are no changes at this time.   3. Pulmonary emphysema, unspecified emphysema type ([place]) Continue pulmonary medications and aerosols as already ordered, these medications have been reviewed and there are no changes at this time.    4. Gastroesophageal reflux disease, esophagitis presence not specified Continue PPI as already ordered, this medication has been reviewed and there are no changes at this time.  Avoidence of caffeine and  alcohol  Moderate elevation of the head of the bed    Current Outpatient Medications on File Prior to Visit  Medication Sig Dispense Refill  . albuterol (PROVENTIL HFA;VENTOLIN HFA) 108 (90 Base) MCG/ACT inhaler INHALE 2 PUFFS INTO THE LUNGS EVERY 4 (FOUR) HOURS AS NEEDED FOR WHEEZING OR SHORTNESS OF BREATH. 8.5 Inhaler 2  . amLODipine (NORVASC) 5 MG tablet Take 5 mg by mouth daily.     Marland Kitchen ammonium lactate (AMLACTIN) 12 % cream Apply 1 g topically as needed for dry skin.    Marland Kitchen apixaban (ELIQUIS) 5 MG TABS tablet Take 5 mg by mouth 2 (two) times daily.    Marland Kitchen aspirin EC 81 MG tablet Take 81 mg by mouth daily.    . citalopram (CELEXA) 20 MG tablet Take 20 mg by mouth at bedtime.   5  . CRANBERRY PO Take 820 mg by mouth daily.    . cyanocobalamin 500 MCG tablet Take 500 mcg by mouth daily.    Marland Kitchen desonide (DESOWEN) 0.05 % cream Apply topically.    . diazepam (VALIUM) 5 MG tablet Take 5 mg by mouth every 12 (twelve) hours as needed for anxiety.     . ergocalciferol (VITAMIN D2) 50000 units capsule Take 50,000 Units by mouth every 30 (thirty) days.    . furosemide (LASIX) 20 MG tablet Take 20 mg by mouth daily. Take one tablet daily.  Can take extra tablet in afternoon if increase weight gain of 3 pounds or increased swelling.    . insulin glargine (LANTUS) 100 UNIT/ML injection Inject 70 Units into the skin at bedtime.    . insulin lispro (HUMALOG) 100 UNIT/ML injection Inject 15-25 Units into the skin 3 (three) times daily before meals. 15 units before breakfast, 25 units before lunch, and 25 units before supper plus additional units for sliding scale    . ketoconazole (NIZORAL) 2 % shampoo Apply 1 application topically 2 (two) times a week.    Marland Kitchen lisinopril (PRINIVIL,ZESTRIL) 5  MG tablet Take 5 mg by mouth daily.    . metFORMIN (GLUCOPHAGE) 500 MG tablet Take 1,000 mg by mouth every evening.     . metoprolol (LOPRESSOR) 100 MG tablet Take 100 mg by mouth 2 (two) times daily.  0  . OMEGA-3 FATTY  ACIDS-VITAMIN E PO Take 1 capsule by mouth daily.    . pravastatin (PRAVACHOL) 20 MG tablet Take 20 mg by mouth at bedtime.  3  . tiotropium (SPIRIVA) 18 MCG inhalation capsule Place 18 mcg into inhaler and inhale daily.    . vitamin C (ASCORBIC ACID) 500 MG tablet Take 500 mg by mouth daily.    Grant Ruts INHUB 500-50 MCG/DOSE AEPB TAKE 1 PUFF BY MOUTH TWICE A DAY 60 each 5   No current facility-administered medications on file prior to visit.     There are no Patient Instructions on file for this visit. No follow-ups on file.   Kris Hartmann, NP

## 2018-02-18 ENCOUNTER — Other Ambulatory Visit: Payer: Self-pay | Admitting: Internal Medicine

## 2018-02-18 MED ORDER — ALBUTEROL SULFATE HFA 108 (90 BASE) MCG/ACT IN AERS: 2.0000 | INHALATION_SPRAY | RESPIRATORY_TRACT | 1 refills | Status: DC | PRN

## 2018-02-18 NOTE — Addendum Note (Signed)
Addended by: Stephanie Coup on: 02/18/2018 08:36 AM   Modules accepted: Orders

## 2018-03-05 ENCOUNTER — Other Ambulatory Visit: Payer: Self-pay | Admitting: Internal Medicine

## 2018-03-21 ENCOUNTER — Ambulatory Visit: Payer: Medicare Other | Admitting: Internal Medicine

## 2018-03-21 ENCOUNTER — Encounter: Payer: Self-pay | Admitting: Internal Medicine

## 2018-03-21 VITALS — BP 120/68 | HR 62 | Resp 16 | Ht 72.0 in | Wt 225.0 lb

## 2018-03-21 DIAGNOSIS — J449 Chronic obstructive pulmonary disease, unspecified: Secondary | ICD-10-CM

## 2018-03-21 DIAGNOSIS — G4733 Obstructive sleep apnea (adult) (pediatric): Secondary | ICD-10-CM | POA: Diagnosis not present

## 2018-03-21 DIAGNOSIS — J9611 Chronic respiratory failure with hypoxia: Secondary | ICD-10-CM | POA: Diagnosis not present

## 2018-03-21 NOTE — Patient Instructions (Addendum)
Continue Inhalers as prescribed  Continue Oxygen as prescribed  Continue CPAP as prescribed  Referal for Pulmonary Rehab for COPD  FLU SHOT IS UP TO DATE OCT 3rd  CONTINUE INCENTIVE SPIROMETRY AS PRESCRIBED

## 2018-03-21 NOTE — Progress Notes (Signed)
[facility] [facility] Consultation      MRN# [id number] Ernest Mclean 29-Dec-1940  No next brief History: 77 yo male with COPD, PVD with peripheral claudication, OSA  worst after PNA in 07/2014, now following for COPD and Chronic Hypoxic respiratory failure on 2L o2 (maily for PVD and at night for OSA). Pulmonary nodules on CT, sub cm, mainly due to inflammatory/infectious process.     CC follow up COPD  HPI Patient seen today for follow-up visit for COPD Also has a history of chronic respiratory failure on oxygen therapy Patient has a history of sleep apnea  Patient currently on Advair 500 And Spiriva Patient uses albuterol as needed and prior to exertion  Patient uses incentive spirometry daily 25 times per day  No signs of infection at this time No signs of exacerbation at this time No lower extremity swelling No signs of heart failure  Patient had a flu shot that is up-to-date  I have discussed patient's deconditioned state and it seems like he is a great candidate for pulmonary rehab  Patient also uses CPAP therapy for his underlying sleep apnea Patient states that he is doing well I am unable to review a compliance report as he is not in the system    Medication:   Current Outpatient Medications:  .  albuterol (PROVENTIL HFA;VENTOLIN HFA) 108 (90 Base) MCG/ACT inhaler, Inhale 2 puffs into the lungs every 4 (four) hours as needed for wheezing or shortness of breath., Disp: 25.5 Inhaler, Rfl: 1 .  amLODipine (NORVASC) 5 MG tablet, Take 5 mg by mouth daily. , Disp: , Rfl:  .  ammonium lactate (AMLACTIN) 12 % cream, Apply 1 g topically as needed for dry skin., Disp: , Rfl:  .  apixaban (ELIQUIS) 5 MG TABS tablet, Take 5 mg by mouth 2 (two) times daily., Disp: , Rfl:  .  aspirin EC 81 MG tablet, Take 81 mg by mouth daily., Disp: , Rfl:  .  citalopram (CELEXA) 20 MG tablet, Take 20 mg by mouth at bedtime. , Disp: , Rfl: 5 .  CRANBERRY PO, Take 820 mg by mouth  daily., Disp: , Rfl:  .  cyanocobalamin 500 MCG tablet, Take 500 mcg by mouth daily., Disp: , Rfl:  .  desonide (DESOWEN) 0.05 % cream, Apply topically., Disp: , Rfl:  .  diazepam (VALIUM) 5 MG tablet, Take 5 mg by mouth every 12 (twelve) hours as needed for anxiety. , Disp: , Rfl:  .  ergocalciferol (VITAMIN D2) 50000 units capsule, Take 50,000 Units by mouth every 30 (thirty) days., Disp: , Rfl:  .  furosemide (LASIX) 20 MG tablet, Take 20 mg by mouth daily. Take one tablet daily.  Can take extra tablet in afternoon if increase weight gain of 3 pounds or increased swelling., Disp: , Rfl:  .  insulin glargine (LANTUS) 100 UNIT/ML injection, Inject 70 Units into the skin at bedtime., Disp: , Rfl:  .  insulin lispro (HUMALOG) 100 UNIT/ML injection, Inject 15-25 Units into the skin 3 (three) times daily before meals. 15 units before breakfast, 25 units before lunch, and 25 units before supper plus additional units for sliding scale, Disp: , Rfl:  .  ketoconazole (NIZORAL) 2 % shampoo, Apply 1 application topically 2 (two) times a week., Disp: , Rfl:  .  lisinopril (PRINIVIL,ZESTRIL) 5 MG tablet, Take 5 mg by mouth daily., Disp: , Rfl:  .  metFORMIN (GLUCOPHAGE) 500 MG tablet, Take 1,000 mg by mouth every evening. , Disp: ,  Rfl:  .  metoprolol (LOPRESSOR) 100 MG tablet, Take 100 mg by mouth 2 (two) times daily., Disp: , Rfl: 0 .  OMEGA-3 FATTY ACIDS-VITAMIN E PO, Take 1 capsule by mouth daily., Disp: , Rfl:  .  pravastatin (PRAVACHOL) 20 MG tablet, Take 20 mg by mouth at bedtime., Disp: , Rfl: 3 .  tiotropium (SPIRIVA) 18 MCG inhalation capsule, Place 18 mcg into inhaler and inhale daily., Disp: , Rfl:  .  vitamin C (ASCORBIC ACID) 500 MG tablet, Take 500 mg by mouth daily., Disp: , Rfl:  .  WIXELA INHUB 500-50 MCG/DOSE AEPB, TAKE 1 PUFF BY MOUTH TWICE A DAY, Disp: 180 each, Rfl: 1    Review of Systems  Constitutional: Negative for chills, fever and weight loss.  HENT: Negative for hearing loss.     Eyes: Negative for blurred vision.  Respiratory: Positive for shortness of breath. Negative for cough, sputum production and wheezing.        Improving SOB, now mostly with exertion  Cardiovascular: Positive for claudication. Negative for chest pain, palpitations and orthopnea.  Gastrointestinal: Negative for heartburn and nausea.  Genitourinary: Negative for dysuria.  Musculoskeletal: Positive for myalgias.       Chronic leg pain with exercise or exertion, has PVD/PAD  Skin: Negative for rash.  Neurological: Negative for headaches.     Ht 6' (1.829 m)   BMI 30.60 kg/m  BP 120/68 (BP Location: Left Arm, Cuff Size: Large)   Pulse 62   Resp 16   Ht 6' (1.829 m)   Wt 225 lb (102.1 kg)   SpO2 94%   BMI 30.52 kg/m   Allergies:  Codeine; Hydralazine; and Penicillins  Physical Examination:   GENERAL:NAD, no fevers, chills, no weakness + fatigue HEAD: Normocephalic, atraumatic.  EYES: Pupils equal, round, reactive to light. Extraocular muscles intact. No scleral icterus.  MOUTH: Moist mucosal membrane. Dentition intact. No abscess noted.  EAR, NOSE, THROAT: Clear without exudates. No external lesions.  NECK: Supple. No thyromegaly. No nodules. No JVD.  PULMONARY: CTA B/L no wheezing, rhonchi, crackles CARDIOVASCULAR: S1 and S2. Regular rate and rhythm. No murmurs, rubs, or gallops. No edema. Pedal pulses 2+ bilaterally.  GASTROINTESTINAL: Soft, nontender, nondistended. No masses. Positive bowel sounds. No hepatosplenomegaly.  MUSCULOSKELETAL: No swelling, clubbing, or edema. Range of motion full in all extremities.  NEUROLOGIC: Cranial nerves II through XII are intact. No gross focal neurological deficits. Sensation intact. Reflexes intact.  SKIN: No ulceration, lesions, rashes, or cyanosis. Skin warm and dry. Turgor intact.  PSYCHIATRIC: Mood, affect within normal limits. The patient is awake, alert and oriented x 3. Insight, judgment intact.  ALL OTHER ROS ARE NEGATIVE         6MWT - 02/10/15 - distance: 241m - lowest saturation: 97% - no complaints during walk, no need for supplemental O2 with exertion  6MWT 09/20/2015 - distance 1198ft - lowest sat  95% - History of peripheral vascular disease and claudication, completed 8.5 minutes of 10 minutes of testing, stopped early due to leg pain  PFTs 02/10/15 postBD FEV1 75% postBD FVC 70% FEV1/FVC 77% RV 147 TLC 126 RV/TLC 110 DLCO - unable toperform Impression - decrease FEV1, preserved FEV1/FVC ratio. Mild/Mod obstruction on inhalers, air trapping/hyperinflation noted.  Significant response to BD, >219ml on FVC and FEV1 post BD   EADIOLOGY: (The following images and results were reviewed by Dr. Stevenson Clinch on 03/21/2018). CT Chest 02/15/16 IMPRESSION:CT chest 01/2016 1. Several new pulmonary nodules since the prior chest CT in June  2016. 2. Patchy area of tree-in-bud appearance in the right upper lobe is likely inflammation or atypical infection. 3. Probable area of rounded atelectasis in the left lower lobe. 4. Stable emphysematous changes. 5. Advanced atherosclerotic calcifications involving the thoracic and abdominal aorta and branch vessels. 6. Scattered mediastinal and hilar lymph nodes. 7. I do not think PET-CT would be helpful as these nodules are too small. Abdominal/pelvic CT may be helpful to exclude malignancy. If this is negative a three-month follow-up chest CT is suggested.   Repeat CT chest 05/2016 IMPRESSION: 1. Scattered small pulmonary nodules, regarded as new on the prior exam, are smaller or resolved on the current exam. 2. Resolved subpleural consolidation/ground-glass in the left lower lobe with residual scarring. 3. Additional pulmonary nodules are unchanged from 07/15/2014 and considered benign. 4. Aortic atherosclerosis (ICD10-170.0). Coronary artery calcification. 5. Cholelithiasis.  01/2015 PFTs  - preserved FEV1/FVC ratio, airtrapping and hyperinflation, significant  response to BD.  6MWt  - 242m, no desats <88%, no need for supplemental O2.   09/2015 18mwt - 34m, lowest sat 95%, completed 8.5 of 89min, stopped early due to leg pain  02/02/2016: 78mwt - 481m/1378ft, lowest sat 97%, highest HR 80. Again on supplemental O2 for PVD with Claudication (given by Cards and Vasc Surg) and OSA with hypoxemia at night.   Assessment and Plan:  77 year old pleasant white male seen today for mild to moderate COPD Gold stage B with CKD CHF and obstructive sleep apnea with chronic hypoxic respiratory failure in the setting of deconditioned state, Patient also has a history of pulmonary nodules   #1 chronic shortness of breath and dyspnea on exertion multifactorial Likely related to his COPD and deconditioned state   #2 COPD No acute exacerbation at this time No signs of infection Continue Advair as prescribed Continue Spiriva as prescribed Albuterol as needed Patient advised to use albuterol 2 minutes prior to any exertional activities Continue oxygen as prescribed   #3 chronic hypoxic respiratory failure from COPD Continue oxygen as prescribed Patient benefits and uses oxygen therapy and needs this for survival  #4 pulmonary nodules No further scans needed at this time as the nodules have resolved on prior CT scans   #5 OSA on CPAP Patient with known history of sleep apnea on CPAP at 10 cm of water pressure Patient had significant hypoxia and is currently on oxygen therapy 2 L supplemental oxygen I am unable to obtain a compliance report as he is not plugged into the system patient is using and benefiting from his CPAP therapy     #6 deconditioned state with chronic shortness of breath dyspnea on exertion from COPD Continue incentive spirometry as prescribed he uses a 25 times a day I strongly recommend pulmonary rehab referral Increase daily activity and exercise as tolerated    Patient/Family are satisfied with Plan of action and management.  All questions answered Follow up in 6 months  Ernest Mclean Patricia Pesa, M.D.  [facility]  Medical Director [place] Director [facility] Cardio-Pulmonary Department

## 2018-04-01 ENCOUNTER — Encounter: Payer: Medicare Other | Attending: Internal Medicine

## 2018-04-01 ENCOUNTER — Other Ambulatory Visit: Payer: Self-pay

## 2018-04-01 VITALS — Ht 73.0 in | Wt 229.5 lb

## 2018-04-01 DIAGNOSIS — J449 Chronic obstructive pulmonary disease, unspecified: Secondary | ICD-10-CM | POA: Diagnosis not present

## 2018-04-01 NOTE — Progress Notes (Signed)
Pulmonary Individual Treatment Plan  Patient Details  Name: Ernest Mclean MRN: [id number] Date of Birth: 11-19-1940 Referring Provider:     Pulmonary Rehab from 04/01/2018 in [facility] Cardiac and Pulmonary Rehab  Referring Provider  Kasa      Initial Encounter Date:    Pulmonary Rehab from 04/01/2018 in [facility] Cardiac and Pulmonary Rehab  Date  04/01/18      Visit Diagnosis: Chronic obstructive pulmonary disease, unspecified COPD type ([place])  Patient's Home Medications on Admission:  Current Outpatient Medications:  .  albuterol (PROVENTIL HFA;VENTOLIN HFA) 108 (90 Base) MCG/ACT inhaler, Inhale 2 puffs into the lungs every 4 (four) hours as needed for wheezing or shortness of breath., Disp: 25.5 Inhaler, Rfl: 1 .  amLODipine (NORVASC) 5 MG tablet, Take 5 mg by mouth daily. , Disp: , Rfl:  .  ammonium lactate (AMLACTIN) 12 % cream, Apply 1 g topically as needed for dry skin., Disp: , Rfl:  .  apixaban (ELIQUIS) 5 MG TABS tablet, Take 5 mg by mouth 2 (two) times daily., Disp: , Rfl:  .  aspirin EC 81 MG tablet, Take 81 mg by mouth daily., Disp: , Rfl:  .  citalopram (CELEXA) 20 MG tablet, Take 20 mg by mouth at bedtime. , Disp: , Rfl: 5 .  CRANBERRY PO, Take 820 mg by mouth daily., Disp: , Rfl:  .  cyanocobalamin 500 MCG tablet, Take 500 mcg by mouth daily., Disp: , Rfl:  .  desonide (DESOWEN) 0.05 % cream, Apply topically., Disp: , Rfl:  .  diazepam (VALIUM) 5 MG tablet, Take 5 mg by mouth every 12 (twelve) hours as needed for anxiety. , Disp: , Rfl:  .  ergocalciferol (VITAMIN D2) 50000 units capsule, Take 50,000 Units by mouth every 30 (thirty) days., Disp: , Rfl:  .  furosemide (LASIX) 20 MG tablet, Take 20 mg by mouth daily. Take one tablet daily.  Can take extra tablet in afternoon if increase weight gain of 3 pounds or increased swelling., Disp: , Rfl:  .  insulin glargine (LANTUS) 100 UNIT/ML injection, Inject 70 Units into the skin at bedtime., Disp: , Rfl:  .  insulin lispro  (HUMALOG) 100 UNIT/ML injection, Inject 15-25 Units into the skin 3 (three) times daily before meals. 15 units before breakfast, 25 units before lunch, and 25 units before supper plus additional units for sliding scale, Disp: , Rfl:  .  ketoconazole (NIZORAL) 2 % shampoo, Apply 1 application topically 2 (two) times a week., Disp: , Rfl:  .  lisinopril (PRINIVIL,ZESTRIL) 5 MG tablet, Take 5 mg by mouth daily., Disp: , Rfl:  .  metFORMIN (GLUCOPHAGE) 500 MG tablet, Take 1,000 mg by mouth every evening. , Disp: , Rfl:  .  metoprolol (LOPRESSOR) 100 MG tablet, Take 100 mg by mouth 2 (two) times daily., Disp: , Rfl: 0 .  OMEGA-3 FATTY ACIDS-VITAMIN E PO, Take 1 capsule by mouth daily., Disp: , Rfl:  .  pravastatin (PRAVACHOL) 20 MG tablet, Take 20 mg by mouth at bedtime., Disp: , Rfl: 3 .  tiotropium (SPIRIVA) 18 MCG inhalation capsule, Place 18 mcg into inhaler and inhale daily., Disp: , Rfl:  .  vitamin C (ASCORBIC ACID) 500 MG tablet, Take 500 mg by mouth daily., Disp: , Rfl:  .  WIXELA INHUB 500-50 MCG/DOSE AEPB, TAKE 1 PUFF BY MOUTH TWICE A DAY, Disp: 180 each, Rfl: 1  Past Medical History: Past Medical History:  Diagnosis Date  . Atrial fibrillation ([place])   . Basal  cell carcinoma 06/2017   Left Ear  . CAD (coronary artery disease)   . CKD (chronic kidney disease)   . Congestive heart failure ([place])   . COPD (chronic obstructive pulmonary disease) ([place])   . Diabetes ([place])   . Hypertension   . OSA on CPAP   . 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 Packs/day: 1.00  . Years: 30.00  . Pack years: 30.00  . Types: Cigarettes  . Last attempt to quit: 12/14/2001  . Years since quitting: 16.3  Smokeless Tobacco Never Used  Tobacco Comment   quit 12/14/2001    Labs: Recent Review Flowsheet Data    Labs for ITP Cardiac and Pulmonary Rehab Latest Ref Rng & Units 06/20/2014 06/23/2014 05/13/2015 08/01/2016    Cholestrol 0 - 200 mg/dL 160 - - -   LDLCALC 0 - 100 mg/dL 70 - - -   HDL 40 - 60 mg/dL 28(L) - - -   Trlycerides 0 - 200 mg/dL 311(H) - - -   Hemoglobin A1c 4.8 - 5.6 % - 10.8(H) 7.9(H) 8.4(H)   HCO3 20.0 - 28.0 mmol/L - - - 32.4(H)       Pulmonary Assessment Scores: Pulmonary Assessment Scores    Row Name 04/01/18 1412         ADL UCSD   ADL Phase  Entry     SOB Score total  3     Rest  0     Walk  0     Stairs  0     Bath  0     Dress  0     Shop  0       CAT Score   CAT Score  4       mMRC Score   mMRC Score  1        Pulmonary Function Assessment: Pulmonary Function Assessment - 04/01/18 1413      Initial Spirometry Results   FVC%  64 %    FEV1%  68 %    FEV1/FVC Ratio  77    Comments  test performed on 02/10/2015      Post Bronchodilator Spirometry Results   FVC%  70 %    FEV1%  75 %    FEV1/FVC Ratio  78    Comments  test performed on 02/10/2015      Breath   Bilateral Breath Sounds  Clear    Shortness of Breath  Yes;Limiting activity       Exercise Target Goals: Exercise Program Goal: Individual exercise prescription set using results from initial 6 min walk test and THRR while considering  patient's activity barriers and safety.   Exercise Prescription Goal: Initial exercise prescription builds to 30-45 minutes a day of aerobic activity, 2-3 days per week.  Home exercise guidelines will be given to patient during program as part of exercise prescription that the participant will acknowledge.  Activity Barriers & Risk Stratification:   6 Minute Walk: 6 Minute Walk    Row Name 04/01/18 1606         6 Minute Walk   Phase  Initial     Distance  733 feet     Walk Time  6 minutes     # of Rest Breaks  0     MPH  1.39     METS  1.45  RPE  13     Perceived Dyspnea   0     VO2 Peak  5.06     Symptoms  Yes (comment)     Comments  R calf pain     Resting HR  61 bpm     Resting BP  148/64     Resting Oxygen Saturation   95 %      Exercise Oxygen Saturation  during 6 min walk  92 %     Max Ex. HR  76 bpm     Max Ex. BP  156/60     2 Minute Post BP  142/64       Interval HR   1 Minute HR  59     2 Minute HR  58     3 Minute HR  61     5 Minute HR  65     6 Minute HR  76     2 Minute Post HR  59     Interval Heart Rate?  Yes       Interval Oxygen   Interval Oxygen?  Yes     Baseline Oxygen Saturation %  95 %     1 Minute Oxygen Saturation %  93 %     1 Minute Liters of Oxygen  0 L     2 Minute Oxygen Saturation %  94 %     2 Minute Liters of Oxygen  0 L     3 Minute Oxygen Saturation %  93 %     3 Minute Liters of Oxygen  0 L     4 Minute Liters of Oxygen  0 L     5 Minute Oxygen Saturation %  92 %     5 Minute Liters of Oxygen  0 L     6 Minute Oxygen Saturation %  94 %     6 Minute Liters of Oxygen  0 L     2 Minute Post Oxygen Saturation %  96 %     2 Minute Post Liters of Oxygen  0 L       Oxygen Initial Assessment: Oxygen Initial Assessment - 04/01/18 1356      Home Oxygen   Home Oxygen Device  Home Concentrator;E-Tanks    Sleep Oxygen Prescription  Continuous    Liters per minute  2    Home Exercise Oxygen Prescription  None    Home at Rest Exercise Oxygen Prescription  None    Compliance with Home Oxygen Use  Yes      Initial 6 min Walk   Oxygen Used  None      Program Oxygen Prescription   Program Oxygen Prescription  None      Intervention   Short Term Goals  To learn and demonstrate proper use of respiratory medications;To learn and demonstrate proper pursed lip breathing techniques or other breathing techniques.;To learn and understand importance of maintaining oxygen saturations>88%;To learn and understand importance of monitoring SPO2 with pulse oximeter and demonstrate accurate use of the pulse oximeter.;To learn and exhibit compliance with exercise, home and travel O2 prescription    Long  Term Goals  Exhibits compliance with exercise, home and travel O2 prescription;Verbalizes  importance of monitoring SPO2 with pulse oximeter and return demonstration;Maintenance of O2 saturations>88%;Exhibits proper breathing techniques, such as pursed lip breathing or other method taught during program session;Compliance with respiratory medication;Demonstrates proper use of MDI's       Oxygen Re-Evaluation:  Oxygen Discharge (Final Oxygen Re-Evaluation):   Initial Exercise Prescription: Initial Exercise Prescription - 04/01/18 1600      Date of Initial Exercise RX and Referring Provider   Date  04/01/18    Referring Provider  Kasa      Treadmill   MPH  1    Grade  0    Minutes  15   as tolerated - stop when needed to rest   METs  1.77      NuStep   Level  1    SPM  80    Minutes  15    METs  1.5      REL-XR   Level  1    Speed  50    Minutes  15    METs  1.5      Prescription Details   Frequency (times per week)  3    Duration  Progress to 45 minutes of aerobic exercise without signs/symptoms of physical distress      Intensity   THRR 40-80% of Max Heartrate  94-128    Ratings of Perceived Exertion  11-13    Perceived Dyspnea  0-4      Resistance Training   Training Prescription  Yes    Weight  3 lb    Reps  10-15       Perform Capillary Blood Glucose checks as needed.  Exercise Prescription Changes:   Exercise Comments:   Exercise Goals and Review: Exercise Goals    Row Name 04/01/18 1605             Exercise Goals   Increase Physical Activity  Yes       Intervention  Provide advice, education, support and counseling about physical activity/exercise needs.;Develop an individualized exercise prescription for aerobic and resistive training based on initial evaluation findings, risk stratification, comorbidities and participant's personal goals.       Expected Outcomes  Short Term: Attend rehab on a regular basis to increase amount of physical activity.;Long Term: Add in home exercise to make exercise part of routine and to increase  amount of physical activity.;Long Term: Exercising regularly at least 3-5 days a week.       Increase Strength and Stamina  Yes       Intervention  Provide advice, education, support and counseling about physical activity/exercise needs.;Develop an individualized exercise prescription for aerobic and resistive training based on initial evaluation findings, risk stratification, comorbidities and participant's personal goals.       Expected Outcomes  Short Term: Increase workloads from initial exercise prescription for resistance, speed, and METs.;Short Term: Perform resistance training exercises routinely during rehab and add in resistance training at home;Long Term: Improve cardiorespiratory fitness, muscular endurance and strength as measured by increased METs and functional capacity (6MWT)       Able to understand and use rate of perceived exertion (RPE) scale  Yes       Intervention  Provide education and explanation on how to use RPE scale       Expected Outcomes  Long Term:  Able to use RPE to guide intensity level when exercising independently;Short Term: Able to use RPE daily in rehab to express subjective intensity level       Able to understand and use Dyspnea scale  Yes       Intervention  Provide education and explanation on how to use Dyspnea scale       Expected Outcomes  Short Term: Able to use Dyspnea scale  daily in rehab to express subjective sense of shortness of breath during exertion;Long Term: Able to use Dyspnea scale to guide intensity level when exercising independently       Knowledge and understanding of Target Heart Rate Range (THRR)  Yes       Intervention  Provide education and explanation of THRR including how the numbers were predicted and where they are located for reference       Expected Outcomes  Short Term: Able to state/look up THRR;Short Term: Able to use daily as guideline for intensity in rehab;Long Term: Able to use THRR to govern intensity when exercising  independently       Able to check pulse independently  Yes       Intervention  Provide education and demonstration on how to check pulse in carotid and radial arteries.;Review the importance of being able to check your own pulse for safety during independent exercise       Expected Outcomes  Short Term: Able to explain why pulse checking is important during independent exercise;Long Term: Able to check pulse independently and accurately       Understanding of Exercise Prescription  Yes       Intervention  Provide education, explanation, and written materials on patient's individual exercise prescription       Expected Outcomes  Short Term: Able to explain program exercise prescription;Long Term: Able to explain home exercise prescription to exercise independently          Exercise Goals Re-Evaluation :   Discharge Exercise Prescription (Final Exercise Prescription Changes):   Nutrition:  Target Goals: Understanding of nutrition guidelines, daily intake of sodium <154m, cholesterol <2074m calories 30% from fat and 7% or less from saturated fats, daily to have 5 or more servings of fruits and vegetables.  Biometrics: Pre Biometrics - 04/01/18 1604      Pre Biometrics   Height  _0  (1.854 m)    Weight  229 lb 8 oz (104.1 kg)    Waist Circumference  44.5 inches    Hip Circumference  43 inches    Waist to Hip Ratio  1.03 %    BMI (Calculated)  30.29        Nutrition Therapy Plan and Nutrition Goals: Nutrition Therapy & Goals - 04/01/18 1401      Personal Nutrition Goals   Nutrition Goal  Lose some weight    Comments  His is on a low fat and low sodium diet.      Intervention Plan   Intervention  Prescribe, educate and counsel regarding individualized specific dietary modifications aiming towards targeted core components such as weight, hypertension, lipid management, diabetes, heart failure and other comorbidities.    Expected Outcomes  Short Term Goal: Understand basic  principles of dietary content, such as calories, fat, sodium, cholesterol and nutrients.;Long Term Goal: Adherence to prescribed nutrition plan.       Nutrition Assessments: Nutrition Assessments - 04/01/18 1407      MEDFICTS Scores   Pre Score  24       Nutrition Goals Re-Evaluation:   Nutrition Goals Discharge (Final Nutrition Goals Re-Evaluation):   Psychosocial: Target Goals: Acknowledge presence or absence of significant depression and/or stress, maximize coping skills, provide positive support system. Participant is able to verbalize types and ability to use techniques and skills needed for reducing stress and depression.   Initial Review & Psychosocial Screening: Initial Psych Review & Screening - 04/01/18 1357      Initial Review  Current issues with  Current Stress Concerns    Source of Stress Concerns  Chronic Illness    Comments  Going to the doctors and going to funerals make his stressed out. His breathing and there are things that he cant do anymore.      Family Dynamics   Good Support System?  Yes    Comments  His wife of 53 years is his support system.      Barriers   Psychosocial barriers to participate in program  There are no identifiable barriers or psychosocial needs.;The patient should benefit from training in stress management and relaxation.      Screening Interventions   Interventions  Encouraged to exercise;Program counselor consult;Provide feedback about the scores to participant;To provide support and resources with identified psychosocial needs    Expected Outcomes  Short Term goal: Utilizing psychosocial counselor, staff and physician to assist with identification of specific Stressors or current issues interfering with healing process. Setting desired goal for each stressor or current issue identified.;Long Term Goal: Stressors or current issues are controlled or eliminated.;Short Term goal: Identification and review with participant of any  Quality of Life or Depression concerns found by scoring the questionnaire.;Long Term goal: The participant improves quality of Life and PHQ9 Scores as seen by post scores and/or verbalization of changes       Quality of Life Scores:  Scores of 19 and below usually indicate a poorer quality of life in these areas.  A difference of  2-3 points is a clinically meaningful difference.  A difference of 2-3 points in the total score of the Quality of Life Index has been associated with significant improvement in overall quality of life, self-image, physical symptoms, and general health in studies assessing change in quality of life.  PHQ-9: Recent Review Flowsheet Data    Depression screen [facility] 2/9 04/01/2018 07/16/2017 03/19/2017 11/15/2016 08/17/2016   Decreased Interest 0 0 0 0 0   Down, Depressed, Hopeless 0 0 0 0 0   PHQ - 2 Score 0 0 0 0 0   Altered sleeping 0 - - - -   Tired, decreased energy 1 - - - -   Change in appetite 0 - - - -   Feeling bad or failure about yourself  0 - - - -   Trouble concentrating 0 - - - -   Moving slowly or fidgety/restless 0 - - - -   Suicidal thoughts 0 - - - -   PHQ-9 Score 1 - - - -   Difficult doing work/chores Not difficult at all - - - -     Interpretation of Total Score  Total Score Depression Severity:  1-4 = Minimal depression, 5-9 = Mild depression, 10-14 = Moderate depression, 15-19 = Moderately severe depression, 20-27 = Severe depression   Psychosocial Evaluation and Intervention:   Psychosocial Re-Evaluation:   Psychosocial Discharge (Final Psychosocial Re-Evaluation):   Education: Education Goals: Education classes will be provided on a weekly basis, covering required topics. Participant will state understanding/return demonstration of topics presented.  Learning Barriers/Preferences: Learning Barriers/Preferences - 04/01/18 1402      Learning Barriers/Preferences   Learning Barriers  Sight   wears glasses   Learning Preferences   None       Education Topics:  Initial Evaluation Education: - Verbal, written and demonstration of respiratory meds, oximetry and breathing techniques. Instruction on use of nebulizers and MDIs and importance of monitoring MDI activations.   Pulmonary Rehab from 04/01/2018 in  [place] Cardiac and Pulmonary Rehab  Date  04/01/18  Educator  [facility]  Instruction Review Code  1- Verbalizes Understanding      General Nutrition Guidelines/Fats and Fiber: -Group instruction provided by verbal, written material, models and posters to present the general guidelines for heart healthy nutrition. Gives an explanation and review of dietary fats and fiber.   Controlling Sodium/Reading Food Labels: -Group verbal and written material supporting the discussion of sodium use in heart healthy nutrition. Review and explanation with models, verbal and written materials for utilization of the food label.   Exercise Physiology & General Exercise Guidelines: - Group verbal and written instruction with models to review the exercise physiology of the cardiovascular system and associated critical values. Provides general exercise guidelines with specific guidelines to those with heart or lung disease.    Aerobic Exercise & Resistance Training: - Gives group verbal and written instruction on the various components of exercise. Focuses on aerobic and resistive training programs and the benefits of this training and how to safely progress through these programs.   Flexibility, Balance, Mind/Body Relaxation: Provides group verbal/written instruction on the benefits of flexibility and balance training, including mind/body exercise modes such as yoga, pilates and tai chi.  Demonstration and skill practice provided.   Stress and Anxiety: - Provides group verbal and written instruction about the health risks of elevated stress and causes of high stress.  Discuss the correlation between heart/lung disease and anxiety and  treatment options. Review healthy ways to manage with stress and anxiety.   Depression: - Provides group verbal and written instruction on the correlation between heart/lung disease and depressed mood, treatment options, and the stigmas associated with seeking treatment.   Exercise & Equipment Safety: - Individual verbal instruction and demonstration of equipment use and safety with use of the equipment.   Pulmonary Rehab from 04/01/2018 in [facility] Cardiac and Pulmonary Rehab  Date  04/01/18  Educator  [facility]  Instruction Review Code  1- Verbalizes Understanding      Infection Prevention: - Provides verbal and written material to individual with discussion of infection control including proper hand washing and proper equipment cleaning during exercise session.   Pulmonary Rehab from 04/01/2018 in [facility] Cardiac and Pulmonary Rehab  Date  04/01/18  Educator  [facility]  Instruction Review Code  1- Verbalizes Understanding      Falls Prevention: - Provides verbal and written material to individual with discussion of falls prevention and safety.   Pulmonary Rehab from 04/01/2018 in [facility] Cardiac and Pulmonary Rehab  Date  04/01/18  Educator  [facility]  Instruction Review Code  1- Verbalizes Understanding      Diabetes: - Individual verbal and written instruction to review signs/symptoms of diabetes, desired ranges of glucose level fasting, after meals and with exercise. Advice that pre and post exercise glucose checks will be done for 3 sessions at entry of program.   Chronic Lung Diseases: - Group verbal and written instruction to review updates, respiratory medications, advancements in procedures and treatments. Discuss use of supplemental oxygen including available portable oxygen systems, continuous and intermittent flow rates, concentrators, personal use and safety guidelines. Review proper use of inhaler and spacers. Provide informative websites for self-education.    Energy Conservation: -  Provide group verbal and written instruction for methods to conserve energy, plan and organize activities. Instruct on pacing techniques, use of adaptive equipment and posture/positioning to relieve shortness of breath.   Triggers and Exacerbations: - Group verbal and written instruction to review types  of environmental triggers and ways to prevent exacerbations. Discuss weather changes, air quality and the benefits of nasal washing. Review warning signs and symptoms to help prevent infections. Discuss techniques for effective airway clearance, coughing, and vibrations.   AED/CPR: - Group verbal and written instruction with the use of models to demonstrate the basic use of the AED with the basic ABC's of resuscitation.   Anatomy and Physiology of the Lungs: - Group verbal and written instruction with the use of models to provide basic lung anatomy and physiology related to function, structure and complications of lung disease.   Anatomy & Physiology of the Heart: - Group verbal and written instruction and models provide basic cardiac anatomy and physiology, with the coronary electrical and arterial systems. Review of Valvular disease and Heart Failure   Cardiac Medications: - Group verbal and written instruction to review commonly prescribed medications for heart disease. Reviews the medication, class of the drug, and side effects.   Know Your Numbers and Risk Factors: -Group verbal and written instruction about important numbers in your health.  Discussion of what are risk factors and how they play a role in the disease process.  Review of Cholesterol, Blood Pressure, Diabetes, and BMI and the role they play in your overall health.   Sleep Hygiene: -Provides group verbal and written instruction about how sleep can affect your health.  Define sleep hygiene, discuss sleep cycles and impact of sleep habits. Review good sleep hygiene tips.    Other: -Provides group and verbal instruction  on various topics (see comments)    Knowledge Questionnaire Score: Knowledge Questionnaire Score - 04/01/18 1402      Knowledge Questionnaire Score   Pre Score  15/18   reviewed with patient       Core Components/Risk Factors/Patient Goals at Admission: Personal Goals and Risk Factors at Admission - 04/01/18 1404      Core Components/Risk Factors/Patient Goals on Admission    Weight Management  Yes;Weight Loss    Intervention  Weight Management: Develop a combined nutrition and exercise program designed to reach desired caloric intake, while maintaining appropriate intake of nutrient and fiber, sodium and fats, and appropriate energy expenditure required for the weight goal.;Weight Management: Provide education and appropriate resources to help participant work on and attain dietary goals.;Weight Management/Obesity: Establish reasonable short term and long term weight goals.    Admit Weight  229 lb 8 oz (104.1 kg)    Goal Weight: Short Term  225 lb (102.1 kg)    Goal Weight: Long Term  215 lb (97.5 kg)    Expected Outcomes  Short Term: Continue to assess and modify interventions until short term weight is achieved;Long Term: Adherence to nutrition and physical activity/exercise program aimed toward attainment of established weight goal;Weight Loss: Understanding of general recommendations for a balanced deficit meal plan, which promotes 1-2 lb weight loss per week and includes a negative energy balance of [phone] kcal/d;Understanding recommendations for meals to include 15-35% energy as protein, 25-35% energy from fat, 35-60% energy from carbohydrates, less than 221m of dietary cholesterol, 20-35 gm of total fiber daily;Understanding of distribution of calorie intake throughout the day with the consumption of 4-5 meals/snacks    Improve shortness of breath with ADL's  Yes    Intervention  Provide education, individualized exercise plan and daily activity instruction to help decrease  symptoms of SOB with activities of daily living.    Expected Outcomes  Short Term: Improve cardiorespiratory fitness to achieve a reduction of symptoms  when performing ADLs;Long Term: Be able to perform more ADLs without symptoms or delay the onset of symptoms    Diabetes  Yes    Intervention  Provide education about signs/symptoms and action to take for hypo/hyperglycemia.;Provide education about proper nutrition, including hydration, and aerobic/resistive exercise prescription along with prescribed medications to achieve blood glucose in normal ranges: Fasting glucose 65-99 mg/dL    Expected Outcomes  Short Term: Participant verbalizes understanding of the signs/symptoms and immediate care of hyper/hypoglycemia, proper foot care and importance of medication, aerobic/resistive exercise and nutrition plan for blood glucose control.;Long Term: Attainment of HbA1C < 7%.    Heart Failure  Yes    Intervention  Provide a combined exercise and nutrition program that is supplemented with education, support and counseling about heart failure. Directed toward relieving symptoms such as shortness of breath, decreased exercise tolerance, and extremity edema.    Expected Outcomes  Improve functional capacity of life;Short term: Attendance in program 2-3 days a week with increased exercise capacity. Reported lower sodium intake. Reported increased fruit and vegetable intake. Reports medication compliance.;Short term: Daily weights obtained and reported for increase. Utilizing diuretic protocols set by physician.;Long term: Adoption of self-care skills and reduction of barriers for early signs and symptoms recognition and intervention leading to self-care maintenance.    Hypertension  Yes    Intervention  Provide education on lifestyle modifcations including regular physical activity/exercise, weight management, moderate sodium restriction and increased consumption of fresh fruit, vegetables, and low fat dairy, alcohol  moderation, and smoking cessation.;Monitor prescription use compliance.    Expected Outcomes  Short Term: Continued assessment and intervention until BP is < 140/36m HG in hypertensive participants. < 130/859mHG in hypertensive participants with diabetes, heart failure or chronic kidney disease.;Long Term: Maintenance of blood pressure at goal levels.    Lipids  Yes    Intervention  Provide education and support for participant on nutrition & aerobic/resistive exercise along with prescribed medications to achieve LDL <7037mHDL >57m43m  Expected Outcomes  Short Term: Participant states understanding of desired cholesterol values and is compliant with medications prescribed. Participant is following exercise prescription and nutrition guidelines.;Long Term: Cholesterol controlled with medications as prescribed, with individualized exercise RX and with personalized nutrition plan. Value goals: LDL < 70mg60mL > 40 mg.       Core Components/Risk Factors/Patient Goals Review:    Core Components/Risk Factors/Patient Goals at Discharge (Final Review):    ITP Comments: ITP Comments    Row Name 04/01/18 1345           ITP Comments  Medical Evaluation completed. Chart sent for review and changes to Dr. Mark Emily Filbertctor of [facility]          Comments: Initial ITP

## 2018-04-01 NOTE — Patient Instructions (Signed)
Patient Instructions  Patient Details  Name: Ernest Mclean MRN: [id number] Date of Birth: 05/22/1941 Referring Provider:  Flora Lipps, MD  Below are your personal goals for exercise, nutrition, and risk factors. Our goal is to help you stay on track towards obtaining and maintaining these goals. We will be discussing your progress on these goals with you throughout the program.  Initial Exercise Prescription: Initial Exercise Prescription - 04/01/18 1600      Date of Initial Exercise RX and Referring Provider   Date  04/01/18    Referring Provider  Kasa      Treadmill   MPH  1    Grade  0    Minutes  15   as tolerated - stop when needed to rest   METs  1.77      NuStep   Level  1    SPM  80    Minutes  15    METs  1.5      REL-XR   Level  1    Speed  50    Minutes  15    METs  1.5      Prescription Details   Frequency (times per week)  3    Duration  Progress to 45 minutes of aerobic exercise without signs/symptoms of physical distress      Intensity   THRR 40-80% of Max Heartrate  94-128    Ratings of Perceived Exertion  11-13    Perceived Dyspnea  0-4      Resistance Training   Training Prescription  Yes    Weight  3 lb    Reps  10-15       Exercise Goals: Frequency: Be able to perform aerobic exercise two to three times per week in program working toward 2-5 days per week of home exercise.  Intensity: Work with a perceived exertion of 11 (fairly light) - 15 (hard) while following your exercise prescription.  We will make changes to your prescription with you as you progress through the program.   Duration: Be able to do 30 to 45 minutes of continuous aerobic exercise in addition to a 5 minute warm-up and a 5 minute cool-down routine.   Nutrition Goals: Your personal nutrition goals will be established when you do your nutrition analysis with the dietician.  The following are general nutrition guidelines to follow: Cholesterol < 200mg /day Sodium <  1500mg /day Fiber: Men over 50 yrs - 30 grams per day  Personal Goals: Personal Goals and Risk Factors at Admission - 04/01/18 1404      Core Components/Risk Factors/Patient Goals on Admission    Weight Management  Yes;Weight Loss    Intervention  Weight Management: Develop a combined nutrition and exercise program designed to reach desired caloric intake, while maintaining appropriate intake of nutrient and fiber, sodium and fats, and appropriate energy expenditure required for the weight goal.;Weight Management: Provide education and appropriate resources to help participant work on and attain dietary goals.;Weight Management/Obesity: Establish reasonable short term and long term weight goals.    Admit Weight  229 lb 8 oz (104.1 kg)    Goal Weight: Short Term  225 lb (102.1 kg)    Goal Weight: Long Term  215 lb (97.5 kg)    Expected Outcomes  Short Term: Continue to assess and modify interventions until short term weight is achieved;Long Term: Adherence to nutrition and physical activity/exercise program aimed toward attainment of established weight goal;Weight Loss: Understanding of general recommendations for a balanced  deficit meal plan, which promotes 1-2 lb weight loss per week and includes a negative energy balance of [phone] kcal/d;Understanding recommendations for meals to include 15-35% energy as protein, 25-35% energy from fat, 35-60% energy from carbohydrates, less than 200mg  of dietary cholesterol, 20-35 gm of total fiber daily;Understanding of distribution of calorie intake throughout the day with the consumption of 4-5 meals/snacks    Improve shortness of breath with ADL's  Yes    Intervention  Provide education, individualized exercise plan and daily activity instruction to help decrease symptoms of SOB with activities of daily living.    Expected Outcomes  Short Term: Improve cardiorespiratory fitness to achieve a reduction of symptoms when performing ADLs;Long Term: Be able to  perform more ADLs without symptoms or delay the onset of symptoms    Diabetes  Yes    Intervention  Provide education about signs/symptoms and action to take for hypo/hyperglycemia.;Provide education about proper nutrition, including hydration, and aerobic/resistive exercise prescription along with prescribed medications to achieve blood glucose in normal ranges: Fasting glucose 65-99 mg/dL    Expected Outcomes  Short Term: Participant verbalizes understanding of the signs/symptoms and immediate care of hyper/hypoglycemia, proper foot care and importance of medication, aerobic/resistive exercise and nutrition plan for blood glucose control.;Long Term: Attainment of HbA1C < 7%.    Heart Failure  Yes    Intervention  Provide a combined exercise and nutrition program that is supplemented with education, support and counseling about heart failure. Directed toward relieving symptoms such as shortness of breath, decreased exercise tolerance, and extremity edema.    Expected Outcomes  Improve functional capacity of life;Short term: Attendance in program 2-3 days a week with increased exercise capacity. Reported lower sodium intake. Reported increased fruit and vegetable intake. Reports medication compliance.;Short term: Daily weights obtained and reported for increase. Utilizing diuretic protocols set by physician.;Long term: Adoption of self-care skills and reduction of barriers for early signs and symptoms recognition and intervention leading to self-care maintenance.    Hypertension  Yes    Intervention  Provide education on lifestyle modifcations including regular physical activity/exercise, weight management, moderate sodium restriction and increased consumption of fresh fruit, vegetables, and low fat dairy, alcohol moderation, and smoking cessation.;Monitor prescription use compliance.    Expected Outcomes  Short Term: Continued assessment and intervention until BP is < 140/20mm HG in hypertensive  participants. < 130/92mm HG in hypertensive participants with diabetes, heart failure or chronic kidney disease.;Long Term: Maintenance of blood pressure at goal levels.    Lipids  Yes    Intervention  Provide education and support for participant on nutrition & aerobic/resistive exercise along with prescribed medications to achieve LDL 70mg , HDL >40mg .    Expected Outcomes  Short Term: Participant states understanding of desired cholesterol values and is compliant with medications prescribed. Participant is following exercise prescription and nutrition guidelines.;Long Term: Cholesterol controlled with medications as prescribed, with individualized exercise RX and with personalized nutrition plan. Value goals: LDL < 70mg , HDL > 40 mg.       Tobacco Use Initial Evaluation: Social History   Tobacco Use  Smoking Status Former Smoker  . Packs/day: 1.00  . Years: 30.00  . Pack years: 30.00  . Types: Cigarettes  . Last attempt to quit: 12/14/2001  . Years since quitting: 16.3  Smokeless Tobacco Never Used  Tobacco Comment   quit 12/14/2001    Exercise Goals and Review: Exercise Goals    Row Name 04/01/18 [phone]  Exercise Goals   Increase Physical Activity  Yes       Intervention  Provide advice, education, support and counseling about physical activity/exercise needs.;Develop an individualized exercise prescription for aerobic and resistive training based on initial evaluation findings, risk stratification, comorbidities and participant's personal goals.       Expected Outcomes  Short Term: Attend rehab on a regular basis to increase amount of physical activity.;Long Term: Add in home exercise to make exercise part of routine and to increase amount of physical activity.;Long Term: Exercising regularly at least 3-5 days a week.       Increase Strength and Stamina  Yes       Intervention  Provide advice, education, support and counseling about physical activity/exercise  needs.;Develop an individualized exercise prescription for aerobic and resistive training based on initial evaluation findings, risk stratification, comorbidities and participant's personal goals.       Expected Outcomes  Short Term: Increase workloads from initial exercise prescription for resistance, speed, and METs.;Short Term: Perform resistance training exercises routinely during rehab and add in resistance training at home;Long Term: Improve cardiorespiratory fitness, muscular endurance and strength as measured by increased METs and functional capacity (6MWT)       Able to understand and use rate of perceived exertion (RPE) scale  Yes       Intervention  Provide education and explanation on how to use RPE scale       Expected Outcomes  Long Term:  Able to use RPE to guide intensity level when exercising independently;Short Term: Able to use RPE daily in rehab to express subjective intensity level       Able to understand and use Dyspnea scale  Yes       Intervention  Provide education and explanation on how to use Dyspnea scale       Expected Outcomes  Short Term: Able to use Dyspnea scale daily in rehab to express subjective sense of shortness of breath during exertion;Long Term: Able to use Dyspnea scale to guide intensity level when exercising independently       Knowledge and understanding of Target Heart Rate Range (THRR)  Yes       Intervention  Provide education and explanation of THRR including how the numbers were predicted and where they are located for reference       Expected Outcomes  Short Term: Able to state/look up THRR;Short Term: Able to use daily as guideline for intensity in rehab;Long Term: Able to use THRR to govern intensity when exercising independently       Able to check pulse independently  Yes       Intervention  Provide education and demonstration on how to check pulse in carotid and radial arteries.;Review the importance of being able to check your own pulse for safety  during independent exercise       Expected Outcomes  Short Term: Able to explain why pulse checking is important during independent exercise;Long Term: Able to check pulse independently and accurately       Understanding of Exercise Prescription  Yes       Intervention  Provide education, explanation, and written materials on patient's individual exercise prescription       Expected Outcomes  Short Term: Able to explain program exercise prescription;Long Term: Able to explain home exercise prescription to exercise independently          Copy of goals given to participant.

## 2018-04-01 NOTE — Progress Notes (Signed)
Daily Session Note  Patient Details  Name: Ernest Mclean MRN: [id number] Date of Birth: Sep 18, 1940 Referring Provider:     Pulmonary Rehab from 04/01/2018 in [facility] Cardiac and Pulmonary Rehab  Referring Provider  Kasa      Encounter Date: 04/01/2018  Check In: Session Check In - 04/01/18 1344      Check-In   Supervising physician immediately available to respond to emergencies  [facility] immediately available ER MD    Physician(s)  Dr. Jacqualine Code and Jimmye Norman    Location  [facility]-Cardiac & Pulmonary Rehab    Staff Present  Justin Mend RCP,RRT,BSRT;Amanda Oletta Darter, [place], ACSM CEP, Exercise Physiologist    Medication changes reported      No    Fall or balance concerns reported     No    Warm-up and Cool-down  Not performed (comment)   medical evaluation   Resistance Training Performed  No    VAD Patient?  No    PAD/SET Patient?  No      Pain Assessment   Currently in Pain?  No/denies          Social History   Tobacco Use  Smoking Status Former Smoker  . Packs/day: 1.00  . Years: 30.00  . Pack years: 30.00  . Types: Cigarettes  . Last attempt to quit: 12/14/2001  . Years since quitting: 16.3  Smokeless Tobacco Never Used  Tobacco Comment   quit 12/14/2001    Goals Met:  Exercise tolerated well Personal goals reviewed Queuing for purse lip breathing No report of cardiac concerns or symptoms Strength training completed today  Goals Unmet:  Not Applicable  Comments: Pt able to follow exercise prescription today without complaint.  Will continue to monitor for progression. [place] Name 04/01/18 1606         6 Minute Walk   Phase  Initial     Distance  733 feet     Walk Time  6 minutes     # of Rest Breaks  0     MPH  1.39     METS  1.45     RPE  13     Perceived Dyspnea   0     VO2 Peak  5.06     Symptoms  Yes (comment)     Comments  R calf pain     Resting HR  61 bpm     Resting BP  148/64     Resting Oxygen Saturation   95 %     Exercise  Oxygen Saturation  during 6 min walk  92 %     Max Ex. HR  76 bpm     Max Ex. BP  156/60     2 Minute Post BP  142/64       Interval HR   1 Minute HR  59     2 Minute HR  58     3 Minute HR  61     5 Minute HR  65     6 Minute HR  76     2 Minute Post HR  59     Interval Heart Rate?  Yes       Interval Oxygen   Interval Oxygen?  Yes     Baseline Oxygen Saturation %  95 %     1 Minute Oxygen Saturation %  93 %     1 Minute Liters of Oxygen  0 L  2 Minute Oxygen Saturation %  94 %     2 Minute Liters of Oxygen  0 L     3 Minute Oxygen Saturation %  93 %     3 Minute Liters of Oxygen  0 L     4 Minute Liters of Oxygen  0 L     5 Minute Oxygen Saturation %  92 %     5 Minute Liters of Oxygen  0 L     6 Minute Oxygen Saturation %  94 %     6 Minute Liters of Oxygen  0 L     2 Minute Post Oxygen Saturation %  96 %     2 Minute Post Liters of Oxygen  0 L       Service TIme [id number]   Dr. Emily Filbert is Medical Director for [place] and [facility].

## 2018-04-05 DIAGNOSIS — J449 Chronic obstructive pulmonary disease, unspecified: Secondary | ICD-10-CM | POA: Diagnosis not present

## 2018-04-05 LAB — GLUCOSE, CAPILLARY
GLUCOSE-CAPILLARY: 226 mg/dL — AB (ref 70–99)
GLUCOSE-CAPILLARY: 129 mg/dL — AB (ref 70–99)

## 2018-04-05 NOTE — Progress Notes (Signed)
Daily Session Note  Patient Details  Name: Ernest Mclean MRN: [id number] Date of Birth: March 28, 1941 Referring Provider:     Pulmonary Rehab from 04/01/2018 in [facility] Cardiac and Pulmonary Rehab  Referring Provider  Kasa      Encounter Date: 04/05/2018  Check In: Session Check In - 04/05/18 1015      Check-In   Supervising physician immediately available to respond to emergencies  [facility] immediately available ER MD    Physician(s)  Dr. Reita Cliche and Burlene Arnt    Location  [facility]-Cardiac & Pulmonary Rehab    Staff Present  Justin Mend RCP,RRT,BSRT;Amanda Oletta Darter, BA, ACSM CEP, Exercise Physiologist;Meredith Sherryll Burger, RN BSN    Medication changes reported      No    Fall or balance concerns reported     No    Warm-up and Cool-down  Performed as group-led Higher education careers adviser Performed  Yes    VAD Patient?  No    PAD/SET Patient?  No      Pain Assessment   Currently in Pain?  No/denies          Social History   Tobacco Use  Smoking Status Former Smoker  . Packs/day: 1.00  . Years: 30.00  . Pack years: 30.00  . Types: Cigarettes  . Last attempt to quit: 12/14/2001  . Years since quitting: 16.3  Smokeless Tobacco Never Used  Tobacco Comment   quit 12/14/2001    Goals Met:  Exercise tolerated well Personal goals reviewed Queuing for purse lip breathing No report of cardiac concerns or symptoms Strength training completed today  Goals Unmet:  Not Applicable  Comments: First full day of exercise!  Patient was oriented to gym and equipment including functions, settings, policies, and procedures.  Patient's individual exercise prescription and treatment plan were reviewed.  All starting workloads were established based on the results of the 6 minute walk test done at initial orientation visit.  The plan for exercise progression was also introduced and progression will be customized based on patient's performance and goals.    Dr. Emily Filbert is Medical Director  for [place] and [facility].

## 2018-04-08 ENCOUNTER — Encounter: Payer: Medicare Other | Admitting: *Deleted

## 2018-04-08 DIAGNOSIS — J449 Chronic obstructive pulmonary disease, unspecified: Secondary | ICD-10-CM | POA: Diagnosis not present

## 2018-04-08 LAB — GLUCOSE, CAPILLARY
Glucose-Capillary: 207 mg/dL — ABNORMAL HIGH (ref 70–99)
Glucose-Capillary: 145 mg/dL — ABNORMAL HIGH (ref 70–99)

## 2018-04-08 NOTE — Progress Notes (Signed)
Daily Session Note  Patient Details  Name: SHANDY VI MRN: [id number] Date of Birth: 02-Dec-1940 Referring Provider:     Pulmonary Rehab from 04/01/2018 in [facility] Cardiac and Pulmonary Rehab  Referring Provider  Kasa      Encounter Date: 04/08/2018  Check In: Session Check In - 04/08/18 1016      Check-In   Supervising physician immediately available to respond to emergencies  [facility] immediately available ER MD    Physician(s)  Dr. Mariea Clonts and Dr. Cherylann Banas    Location  [facility]-Cardiac & Pulmonary Rehab    Staff Present  Earlean Shawl, BS, ACSM CEP, Exercise Physiologist;[facility], [place], ACSM CEP, Exercise Physiologist    Medication changes reported      No    Fall or balance concerns reported     No    Tobacco Cessation  No Change    Warm-up and Cool-down  Performed as group-led instruction    Resistance Training Performed  Yes    VAD Patient?  No    PAD/SET Patient?  No      Pain Assessment   Currently in Pain?  No/denies    Multiple Pain Sites  No          Social History   Tobacco Use  Smoking Status Former Smoker  . Packs/day: 1.00  . Years: 30.00  . Pack years: 30.00  . Types: Cigarettes  . Last attempt to quit: 12/14/2001  . Years since quitting: 16.3  Smokeless Tobacco Never Used  Tobacco Comment   quit 12/14/2001    Goals Met:  Proper associated with RPD/PD & O2 Sat Independence with exercise equipment Exercise tolerated well No report of cardiac concerns or symptoms Strength training completed today  Goals Unmet:  Not Applicable  Comments: Pt able to follow exercise prescription today without complaint.  Will continue to monitor for progression.    Dr. Emily Filbert is Medical Director for [place] and [facility].

## 2018-04-08 NOTE — Progress Notes (Signed)
Pulmonary Individual Treatment Plan  Patient Details  Name: Ernest Mclean MRN: [id number] Date of Birth: 11-19-1940 Referring Provider:     Pulmonary Rehab from 04/01/2018 in [facility] Cardiac and Pulmonary Rehab  Referring Provider  Kasa      Initial Encounter Date:    Pulmonary Rehab from 04/01/2018 in [facility] Cardiac and Pulmonary Rehab  Date  04/01/18      Visit Diagnosis: Chronic obstructive pulmonary disease, unspecified COPD type ([place])  Patient's Home Medications on Admission:  Current Outpatient Medications:  .  albuterol (PROVENTIL HFA;VENTOLIN HFA) 108 (90 Base) MCG/ACT inhaler, Inhale 2 puffs into the lungs every 4 (four) hours as needed for wheezing or shortness of breath., Disp: 25.5 Inhaler, Rfl: 1 .  amLODipine (NORVASC) 5 MG tablet, Take 5 mg by mouth daily. , Disp: , Rfl:  .  ammonium lactate (AMLACTIN) 12 % cream, Apply 1 g topically as needed for dry skin., Disp: , Rfl:  .  apixaban (ELIQUIS) 5 MG TABS tablet, Take 5 mg by mouth 2 (two) times daily., Disp: , Rfl:  .  aspirin EC 81 MG tablet, Take 81 mg by mouth daily., Disp: , Rfl:  .  citalopram (CELEXA) 20 MG tablet, Take 20 mg by mouth at bedtime. , Disp: , Rfl: 5 .  CRANBERRY PO, Take 820 mg by mouth daily., Disp: , Rfl:  .  cyanocobalamin 500 MCG tablet, Take 500 mcg by mouth daily., Disp: , Rfl:  .  desonide (DESOWEN) 0.05 % cream, Apply topically., Disp: , Rfl:  .  diazepam (VALIUM) 5 MG tablet, Take 5 mg by mouth every 12 (twelve) hours as needed for anxiety. , Disp: , Rfl:  .  ergocalciferol (VITAMIN D2) 50000 units capsule, Take 50,000 Units by mouth every 30 (thirty) days., Disp: , Rfl:  .  furosemide (LASIX) 20 MG tablet, Take 20 mg by mouth daily. Take one tablet daily.  Can take extra tablet in afternoon if increase weight gain of 3 pounds or increased swelling., Disp: , Rfl:  .  insulin glargine (LANTUS) 100 UNIT/ML injection, Inject 70 Units into the skin at bedtime., Disp: , Rfl:  .  insulin lispro  (HUMALOG) 100 UNIT/ML injection, Inject 15-25 Units into the skin 3 (three) times daily before meals. 15 units before breakfast, 25 units before lunch, and 25 units before supper plus additional units for sliding scale, Disp: , Rfl:  .  ketoconazole (NIZORAL) 2 % shampoo, Apply 1 application topically 2 (two) times a week., Disp: , Rfl:  .  lisinopril (PRINIVIL,ZESTRIL) 5 MG tablet, Take 5 mg by mouth daily., Disp: , Rfl:  .  metFORMIN (GLUCOPHAGE) 500 MG tablet, Take 1,000 mg by mouth every evening. , Disp: , Rfl:  .  metoprolol (LOPRESSOR) 100 MG tablet, Take 100 mg by mouth 2 (two) times daily., Disp: , Rfl: 0 .  OMEGA-3 FATTY ACIDS-VITAMIN E PO, Take 1 capsule by mouth daily., Disp: , Rfl:  .  pravastatin (PRAVACHOL) 20 MG tablet, Take 20 mg by mouth at bedtime., Disp: , Rfl: 3 .  tiotropium (SPIRIVA) 18 MCG inhalation capsule, Place 18 mcg into inhaler and inhale daily., Disp: , Rfl:  .  vitamin C (ASCORBIC ACID) 500 MG tablet, Take 500 mg by mouth daily., Disp: , Rfl:  .  WIXELA INHUB 500-50 MCG/DOSE AEPB, TAKE 1 PUFF BY MOUTH TWICE A DAY, Disp: 180 each, Rfl: 1  Past Medical History: Past Medical History:  Diagnosis Date  . Atrial fibrillation ([place])   . Basal  cell carcinoma 06/2017   Left Ear  . CAD (coronary artery disease)   . CKD (chronic kidney disease)   . Congestive heart failure ([place])   . COPD (chronic obstructive pulmonary disease) ([place])   . Diabetes ([place])   . Hypertension   . OSA on CPAP   . 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 Packs/day: 1.00  . Years: 30.00  . Pack years: 30.00  . Types: Cigarettes  . Last attempt to quit: 12/14/2001  . Years since quitting: 16.3  Smokeless Tobacco Never Used  Tobacco Comment   quit 12/14/2001    Labs: Recent Review Flowsheet Data    Labs for ITP Cardiac and Pulmonary Rehab Latest Ref Rng & Units 06/20/2014 06/23/2014 05/13/2015 08/01/2016    Cholestrol 0 - 200 mg/dL 160 - - -   LDLCALC 0 - 100 mg/dL 70 - - -   HDL 40 - 60 mg/dL 28(L) - - -   Trlycerides 0 - 200 mg/dL 311(H) - - -   Hemoglobin A1c 4.8 - 5.6 % - 10.8(H) 7.9(H) 8.4(H)   HCO3 20.0 - 28.0 mmol/L - - - 32.4(H)       Pulmonary Assessment Scores: Pulmonary Assessment Scores    Row Name 04/01/18 1412         ADL UCSD   ADL Phase  Entry     SOB Score total  3     Rest  0     Walk  0     Stairs  0     Bath  0     Dress  0     Shop  0       CAT Score   CAT Score  4       mMRC Score   mMRC Score  1        Pulmonary Function Assessment: Pulmonary Function Assessment - 04/01/18 1413      Initial Spirometry Results   FVC%  64 %    FEV1%  68 %    FEV1/FVC Ratio  77    Comments  test performed on 02/10/2015      Post Bronchodilator Spirometry Results   FVC%  70 %    FEV1%  75 %    FEV1/FVC Ratio  78    Comments  test performed on 02/10/2015      Breath   Bilateral Breath Sounds  Clear    Shortness of Breath  Yes;Limiting activity       Exercise Target Goals: Exercise Program Goal: Individual exercise prescription set using results from initial 6 min walk test and THRR while considering  patient's activity barriers and safety.   Exercise Prescription Goal: Initial exercise prescription builds to 30-45 minutes a day of aerobic activity, 2-3 days per week.  Home exercise guidelines will be given to patient during program as part of exercise prescription that the participant will acknowledge.  Activity Barriers & Risk Stratification:   6 Minute Walk: 6 Minute Walk    Row Name 04/01/18 1606         6 Minute Walk   Phase  Initial     Distance  733 feet     Walk Time  6 minutes     # of Rest Breaks  0     MPH  1.39     METS  1.45  RPE  13     Perceived Dyspnea   0     VO2 Peak  5.06     Symptoms  Yes (comment)     Comments  R calf pain     Resting HR  61 bpm     Resting BP  148/64     Resting Oxygen Saturation   95 %      Exercise Oxygen Saturation  during 6 min walk  92 %     Max Ex. HR  76 bpm     Max Ex. BP  156/60     2 Minute Post BP  142/64       Interval HR   1 Minute HR  59     2 Minute HR  58     3 Minute HR  61     5 Minute HR  65     6 Minute HR  76     2 Minute Post HR  59     Interval Heart Rate?  Yes       Interval Oxygen   Interval Oxygen?  Yes     Baseline Oxygen Saturation %  95 %     1 Minute Oxygen Saturation %  93 %     1 Minute Liters of Oxygen  0 L     2 Minute Oxygen Saturation %  94 %     2 Minute Liters of Oxygen  0 L     3 Minute Oxygen Saturation %  93 %     3 Minute Liters of Oxygen  0 L     4 Minute Liters of Oxygen  0 L     5 Minute Oxygen Saturation %  92 %     5 Minute Liters of Oxygen  0 L     6 Minute Oxygen Saturation %  94 %     6 Minute Liters of Oxygen  0 L     2 Minute Post Oxygen Saturation %  96 %     2 Minute Post Liters of Oxygen  0 L       Oxygen Initial Assessment: Oxygen Initial Assessment - 04/01/18 1356      Home Oxygen   Home Oxygen Device  Home Concentrator;E-Tanks    Sleep Oxygen Prescription  Continuous    Liters per minute  2    Home Exercise Oxygen Prescription  None    Home at Rest Exercise Oxygen Prescription  None    Compliance with Home Oxygen Use  Yes      Initial 6 min Walk   Oxygen Used  None      Program Oxygen Prescription   Program Oxygen Prescription  None      Intervention   Short Term Goals  To learn and demonstrate proper use of respiratory medications;To learn and demonstrate proper pursed lip breathing techniques or other breathing techniques.;To learn and understand importance of maintaining oxygen saturations>88%;To learn and understand importance of monitoring SPO2 with pulse oximeter and demonstrate accurate use of the pulse oximeter.;To learn and exhibit compliance with exercise, home and travel O2 prescription    Long  Term Goals  Exhibits compliance with exercise, home and travel O2 prescription;Verbalizes  importance of monitoring SPO2 with pulse oximeter and return demonstration;Maintenance of O2 saturations>88%;Exhibits proper breathing techniques, such as pursed lip breathing or other method taught during program session;Compliance with respiratory medication;Demonstrates proper use of MDI's       Oxygen Re-Evaluation:  Oxygen Re-Evaluation    Row Name 04/05/18 1016             Program Oxygen Prescription   Program Oxygen Prescription  None         Home Oxygen   Home Oxygen Device  Home Concentrator;E-Tanks       Sleep Oxygen Prescription  Continuous       Liters per minute  2       Home Exercise Oxygen Prescription  None       Home at Rest Exercise Oxygen Prescription  None       Compliance with Home Oxygen Use  Yes         Goals/Expected Outcomes   Short Term Goals  To learn and demonstrate proper use of respiratory medications;To learn and demonstrate proper pursed lip breathing techniques or other breathing techniques.;To learn and understand importance of maintaining oxygen saturations>88%;To learn and understand importance of monitoring SPO2 with pulse oximeter and demonstrate accurate use of the pulse oximeter.;To learn and exhibit compliance with exercise, home and travel O2 prescription       Long  Term Goals  Exhibits compliance with exercise, home and travel O2 prescription;Verbalizes importance of monitoring SPO2 with pulse oximeter and return demonstration;Maintenance of O2 saturations>88%;Exhibits proper breathing techniques, such as pursed lip breathing or other method taught during program session;Compliance with respiratory medication;Demonstrates proper use of MDI's       Comments  Reviewed PLB technique with pt.  Talked about how it work and it's important to maintaining his exercise saturations.         Goals/Expected Outcomes  Short: Become more profiecient at using PLB.   Long: Become independent at using PLB.          Oxygen Discharge (Final Oxygen  Re-Evaluation): Oxygen Re-Evaluation - 04/05/18 1016      Program Oxygen Prescription   Program Oxygen Prescription  None      Home Oxygen   Home Oxygen Device  Home Concentrator;E-Tanks    Sleep Oxygen Prescription  Continuous    Liters per minute  2    Home Exercise Oxygen Prescription  None    Home at Rest Exercise Oxygen Prescription  None    Compliance with Home Oxygen Use  Yes      Goals/Expected Outcomes   Short Term Goals  To learn and demonstrate proper use of respiratory medications;To learn and demonstrate proper pursed lip breathing techniques or other breathing techniques.;To learn and understand importance of maintaining oxygen saturations>88%;To learn and understand importance of monitoring SPO2 with pulse oximeter and demonstrate accurate use of the pulse oximeter.;To learn and exhibit compliance with exercise, home and travel O2 prescription    Long  Term Goals  Exhibits compliance with exercise, home and travel O2 prescription;Verbalizes importance of monitoring SPO2 with pulse oximeter and return demonstration;Maintenance of O2 saturations>88%;Exhibits proper breathing techniques, such as pursed lip breathing or other method taught during program session;Compliance with respiratory medication;Demonstrates proper use of MDI's    Comments  Reviewed PLB technique with pt.  Talked about how it work and it's important to maintaining his exercise saturations.      Goals/Expected Outcomes  Short: Become more profiecient at using PLB.   Long: Become independent at using PLB.       Initial Exercise Prescription: Initial Exercise Prescription - 04/01/18 1600      Date of Initial Exercise RX and Referring Provider   Date  04/01/18    Referring Provider  Kasa  Treadmill   MPH  1    Grade  0    Minutes  15   as tolerated - stop when needed to rest   METs  1.77      NuStep   Level  1    SPM  80    Minutes  15    METs  1.5      REL-XR   Level  1    Speed  50     Minutes  15    METs  1.5      Prescription Details   Frequency (times per week)  3    Duration  Progress to 45 minutes of aerobic exercise without signs/symptoms of physical distress      Intensity   THRR 40-80% of Max Heartrate  94-128    Ratings of Perceived Exertion  11-13    Perceived Dyspnea  0-4      Resistance Training   Training Prescription  Yes    Weight  3 lb    Reps  10-15       Perform Capillary Blood Glucose checks as needed.  Exercise Prescription Changes:   Exercise Comments: Exercise Comments    Row Name 04/05/18 1016           Exercise Comments  First full day of exercise!  Patient was oriented to gym and equipment including functions, settings, policies, and procedures.  Patient's individual exercise prescription and treatment plan were reviewed.  All starting workloads were established based on the results of the 6 minute walk test done at initial orientation visit.  The plan for exercise progression was also introduced and progression will be customized based on patient's performance and goals.          Exercise Goals and Review: Exercise Goals    Row Name 04/01/18 1605             Exercise Goals   Increase Physical Activity  Yes       Intervention  Provide advice, education, support and counseling about physical activity/exercise needs.;Develop an individualized exercise prescription for aerobic and resistive training based on initial evaluation findings, risk stratification, comorbidities and participant's personal goals.       Expected Outcomes  Short Term: Attend rehab on a regular basis to increase amount of physical activity.;Long Term: Add in home exercise to make exercise part of routine and to increase amount of physical activity.;Long Term: Exercising regularly at least 3-5 days a week.       Increase Strength and Stamina  Yes       Intervention  Provide advice, education, support and counseling about physical activity/exercise  needs.;Develop an individualized exercise prescription for aerobic and resistive training based on initial evaluation findings, risk stratification, comorbidities and participant's personal goals.       Expected Outcomes  Short Term: Increase workloads from initial exercise prescription for resistance, speed, and METs.;Short Term: Perform resistance training exercises routinely during rehab and add in resistance training at home;Long Term: Improve cardiorespiratory fitness, muscular endurance and strength as measured by increased METs and functional capacity (6MWT)       Able to understand and use rate of perceived exertion (RPE) scale  Yes       Intervention  Provide education and explanation on how to use RPE scale       Expected Outcomes  Long Term:  Able to use RPE to guide intensity level when exercising independently;Short Term: Able to use RPE daily in rehab  to express subjective intensity level       Able to understand and use Dyspnea scale  Yes       Intervention  Provide education and explanation on how to use Dyspnea scale       Expected Outcomes  Short Term: Able to use Dyspnea scale daily in rehab to express subjective sense of shortness of breath during exertion;Long Term: Able to use Dyspnea scale to guide intensity level when exercising independently       Knowledge and understanding of Target Heart Rate Range (THRR)  Yes       Intervention  Provide education and explanation of THRR including how the numbers were predicted and where they are located for reference       Expected Outcomes  Short Term: Able to state/look up THRR;Short Term: Able to use daily as guideline for intensity in rehab;Long Term: Able to use THRR to govern intensity when exercising independently       Able to check pulse independently  Yes       Intervention  Provide education and demonstration on how to check pulse in carotid and radial arteries.;Review the importance of being able to check your own pulse for safety  during independent exercise       Expected Outcomes  Short Term: Able to explain why pulse checking is important during independent exercise;Long Term: Able to check pulse independently and accurately       Understanding of Exercise Prescription  Yes       Intervention  Provide education, explanation, and written materials on patient's individual exercise prescription       Expected Outcomes  Short Term: Able to explain program exercise prescription;Long Term: Able to explain home exercise prescription to exercise independently          Exercise Goals Re-Evaluation : Exercise Goals Re-Evaluation    Row Name 04/05/18 1016             Exercise Goal Re-Evaluation   Exercise Goals Review  Able to understand and use rate of perceived exertion (RPE) scale;Able to understand and use Dyspnea scale;Knowledge and understanding of Target Heart Rate Range (THRR);Understanding of Exercise Prescription       Comments  Reviewed RPE scale, THR and program prescription with pt today.  Pt voiced understanding and was given a copy of goals to take home.        Expected Outcomes  Short: Use RPE daily to regulate intensity. Long: Follow program prescription in THR.          Discharge Exercise Prescription (Final Exercise Prescription Changes):   Nutrition:  Target Goals: Understanding of nutrition guidelines, daily intake of sodium <1534m, cholesterol <2050m calories 30% from fat and 7% or less from saturated fats, daily to have 5 or more servings of fruits and vegetables.  Biometrics: Pre Biometrics - 04/01/18 1604      Pre Biometrics   Height  6' 1" (1.854 m)    Weight  229 lb 8 oz (104.1 kg)    Waist Circumference  44.5 inches    Hip Circumference  43 inches    Waist to Hip Ratio  1.03 %    BMI (Calculated)  30.29        Nutrition Therapy Plan and Nutrition Goals: Nutrition Therapy & Goals - 04/01/18 1401      Personal Nutrition Goals   Nutrition Goal  Lose some weight    Comments   His is on a low fat and low sodium  diet.      Intervention Plan   Intervention  Prescribe, educate and counsel regarding individualized specific dietary modifications aiming towards targeted core components such as weight, hypertension, lipid management, diabetes, heart failure and other comorbidities.    Expected Outcomes  Short Term Goal: Understand basic principles of dietary content, such as calories, fat, sodium, cholesterol and nutrients.;Long Term Goal: Adherence to prescribed nutrition plan.       Nutrition Assessments: Nutrition Assessments - 04/01/18 1407      MEDFICTS Scores   Pre Score  24       Nutrition Goals Re-Evaluation:   Nutrition Goals Discharge (Final Nutrition Goals Re-Evaluation):   Psychosocial: Target Goals: Acknowledge presence or absence of significant depression and/or stress, maximize coping skills, provide positive support system. Participant is able to verbalize types and ability to use techniques and skills needed for reducing stress and depression.   Initial Review & Psychosocial Screening: Initial Psych Review & Screening - 04/01/18 1357      Initial Review   Current issues with  Current Stress Concerns    Source of Stress Concerns  Chronic Illness    Comments  Going to the doctors and going to funerals make his stressed out. His breathing and there are things that he cant do anymore.      Family Dynamics   Good Support System?  Yes    Comments  His wife of 22 years is his support system.      Barriers   Psychosocial barriers to participate in program  There are no identifiable barriers or psychosocial needs.;The patient should benefit from training in stress management and relaxation.      Screening Interventions   Interventions  Encouraged to exercise;Program counselor consult;Provide feedback about the scores to participant;To provide support and resources with identified psychosocial needs    Expected Outcomes  Short Term goal: Utilizing  psychosocial counselor, staff and physician to assist with identification of specific Stressors or current issues interfering with healing process. Setting desired goal for each stressor or current issue identified.;Long Term Goal: Stressors or current issues are controlled or eliminated.;Short Term goal: Identification and review with participant of any Quality of Life or Depression concerns found by scoring the questionnaire.;Long Term goal: The participant improves quality of Life and PHQ9 Scores as seen by post scores and/or verbalization of changes       Quality of Life Scores:  Scores of 19 and below usually indicate a poorer quality of life in these areas.  A difference of  2-3 points is a clinically meaningful difference.  A difference of 2-3 points in the total score of the Quality of Life Index has been associated with significant improvement in overall quality of life, self-image, physical symptoms, and general health in studies assessing change in quality of life.  PHQ-9: Recent Review Flowsheet Data    Depression screen [facility] 2/9 04/01/2018 07/16/2017 03/19/2017 11/15/2016 08/17/2016   Decreased Interest 0 0 0 0 0   Down, Depressed, Hopeless 0 0 0 0 0   PHQ - 2 Score 0 0 0 0 0   Altered sleeping 0 - - - -   Tired, decreased energy 1 - - - -   Change in appetite 0 - - - -   Feeling bad or failure about yourself  0 - - - -   Trouble concentrating 0 - - - -   Moving slowly or fidgety/restless 0 - - - -   Suicidal thoughts 0 - - - -  PHQ-9 Score 1 - - - -   Difficult doing work/chores Not difficult at all - - - -     Interpretation of Total Score  Total Score Depression Severity:  1-4 = Minimal depression, 5-9 = Mild depression, 10-14 = Moderate depression, 15-19 = Moderately severe depression, 20-27 = Severe depression   Psychosocial Evaluation and Intervention:   Psychosocial Re-Evaluation:   Psychosocial Discharge (Final Psychosocial  Re-Evaluation):   Education: Education Goals: Education classes will be provided on a weekly basis, covering required topics. Participant will state understanding/return demonstration of topics presented.  Learning Barriers/Preferences: Learning Barriers/Preferences - 04/01/18 1402      Learning Barriers/Preferences   Learning Barriers  Sight   wears glasses   Learning Preferences  None       Education Topics:  Initial Evaluation Education: - Verbal, written and demonstration of respiratory meds, oximetry and breathing techniques. Instruction on use of nebulizers and MDIs and importance of monitoring MDI activations.   Pulmonary Rehab from 04/05/2018 in [facility] Cardiac and Pulmonary Rehab  Date  04/01/18  Educator  [facility]  Instruction Review Code  1- Verbalizes Understanding      General Nutrition Guidelines/Fats and Fiber: -Group instruction provided by verbal, written material, models and posters to present the general guidelines for heart healthy nutrition. Gives an explanation and review of dietary fats and fiber.   Controlling Sodium/Reading Food Labels: -Group verbal and written material supporting the discussion of sodium use in heart healthy nutrition. Review and explanation with models, verbal and written materials for utilization of the food label.   Exercise Physiology & General Exercise Guidelines: - Group verbal and written instruction with models to review the exercise physiology of the cardiovascular system and associated critical values. Provides general exercise guidelines with specific guidelines to those with heart or lung disease.    Aerobic Exercise & Resistance Training: - Gives group verbal and written instruction on the various components of exercise. Focuses on aerobic and resistive training programs and the benefits of this training and how to safely progress through these programs.   Flexibility, Balance, Mind/Body Relaxation: Provides group  verbal/written instruction on the benefits of flexibility and balance training, including mind/body exercise modes such as yoga, pilates and tai chi.  Demonstration and skill practice provided.   Stress and Anxiety: - Provides group verbal and written instruction about the health risks of elevated stress and causes of high stress.  Discuss the correlation between heart/lung disease and anxiety and treatment options. Review healthy ways to manage with stress and anxiety.   Depression: - Provides group verbal and written instruction on the correlation between heart/lung disease and depressed mood, treatment options, and the stigmas associated with seeking treatment.   Exercise & Equipment Safety: - Individual verbal instruction and demonstration of equipment use and safety with use of the equipment.   Pulmonary Rehab from 04/05/2018 in [facility] Cardiac and Pulmonary Rehab  Date  04/01/18  Educator  [facility]  Instruction Review Code  1- Verbalizes Understanding      Infection Prevention: - Provides verbal and written material to individual with discussion of infection control including proper hand washing and proper equipment cleaning during exercise session.   Pulmonary Rehab from 04/05/2018 in [facility] Cardiac and Pulmonary Rehab  Date  04/01/18  Educator  [facility]  Instruction Review Code  1- Verbalizes Understanding      Falls Prevention: - Provides verbal and written material to individual with discussion of falls prevention and safety.   Pulmonary Rehab from 04/05/2018  in [facility] Cardiac and Pulmonary Rehab  Date  04/01/18  Educator  [facility]  Instruction Review Code  1- Verbalizes Understanding      Diabetes: - Individual verbal and written instruction to review signs/symptoms of diabetes, desired ranges of glucose level fasting, after meals and with exercise. Advice that pre and post exercise glucose checks will be done for 3 sessions at entry of program.   Chronic Lung Diseases: - Group verbal  and written instruction to review updates, respiratory medications, advancements in procedures and treatments. Discuss use of supplemental oxygen including available portable oxygen systems, continuous and intermittent flow rates, concentrators, personal use and safety guidelines. Review proper use of inhaler and spacers. Provide informative websites for self-education.    Energy Conservation: - Provide group verbal and written instruction for methods to conserve energy, plan and organize activities. Instruct on pacing techniques, use of adaptive equipment and posture/positioning to relieve shortness of breath.   Triggers and Exacerbations: - Group verbal and written instruction to review types of environmental triggers and ways to prevent exacerbations. Discuss weather changes, air quality and the benefits of nasal washing. Review warning signs and symptoms to help prevent infections. Discuss techniques for effective airway clearance, coughing, and vibrations.   AED/CPR: - Group verbal and written instruction with the use of models to demonstrate the basic use of the AED with the basic ABC's of resuscitation.   Anatomy and Physiology of the Lungs: - Group verbal and written instruction with the use of models to provide basic lung anatomy and physiology related to function, structure and complications of lung disease.   Pulmonary Rehab from 04/05/2018 in [facility] Cardiac and Pulmonary Rehab  Date  04/05/18  Educator  [facility]  Instruction Review Code  1- Verbalizes Understanding      Anatomy & Physiology of the Heart: - Group verbal and written instruction and models provide basic cardiac anatomy and physiology, with the coronary electrical and arterial systems. Review of Valvular disease and Heart Failure   Cardiac Medications: - Group verbal and written instruction to review commonly prescribed medications for heart disease. Reviews the medication, class of the drug, and side effects.   Know  Your Numbers and Risk Factors: -Group verbal and written instruction about important numbers in your health.  Discussion of what are risk factors and how they play a role in the disease process.  Review of Cholesterol, Blood Pressure, Diabetes, and BMI and the role they play in your overall health.   Sleep Hygiene: -Provides group verbal and written instruction about how sleep can affect your health.  Define sleep hygiene, discuss sleep cycles and impact of sleep habits. Review good sleep hygiene tips.    Other: -Provides group and verbal instruction on various topics (see comments)    Knowledge Questionnaire Score: Knowledge Questionnaire Score - 04/01/18 1402      Knowledge Questionnaire Score   Pre Score  15/18   reviewed with patient       Core Components/Risk Factors/Patient Goals at Admission: Personal Goals and Risk Factors at Admission - 04/01/18 1404      Core Components/Risk Factors/Patient Goals on Admission    Weight Management  Yes;Weight Loss    Intervention  Weight Management: Develop a combined nutrition and exercise program designed to reach desired caloric intake, while maintaining appropriate intake of nutrient and fiber, sodium and fats, and appropriate energy expenditure required for the weight goal.;Weight Management: Provide education and appropriate resources to help participant work on and attain dietary goals.;Weight Management/Obesity: Establish  reasonable short term and long term weight goals.    Admit Weight  229 lb 8 oz (104.1 kg)    Goal Weight: Short Term  225 lb (102.1 kg)    Goal Weight: Long Term  215 lb (97.5 kg)    Expected Outcomes  Short Term: Continue to assess and modify interventions until short term weight is achieved;Long Term: Adherence to nutrition and physical activity/exercise program aimed toward attainment of established weight goal;Weight Loss: Understanding of general recommendations for a balanced deficit meal plan, which promotes  1-2 lb weight loss per week and includes a negative energy balance of [phone] kcal/d;Understanding recommendations for meals to include 15-35% energy as protein, 25-35% energy from fat, 35-60% energy from carbohydrates, less than 249m of dietary cholesterol, 20-35 gm of total fiber daily;Understanding of distribution of calorie intake throughout the day with the consumption of 4-5 meals/snacks    Improve shortness of breath with ADL's  Yes    Intervention  Provide education, individualized exercise plan and daily activity instruction to help decrease symptoms of SOB with activities of daily living.    Expected Outcomes  Short Term: Improve cardiorespiratory fitness to achieve a reduction of symptoms when performing ADLs;Long Term: Be able to perform more ADLs without symptoms or delay the onset of symptoms    Diabetes  Yes    Intervention  Provide education about signs/symptoms and action to take for hypo/hyperglycemia.;Provide education about proper nutrition, including hydration, and aerobic/resistive exercise prescription along with prescribed medications to achieve blood glucose in normal ranges: Fasting glucose 65-99 mg/dL    Expected Outcomes  Short Term: Participant verbalizes understanding of the signs/symptoms and immediate care of hyper/hypoglycemia, proper foot care and importance of medication, aerobic/resistive exercise and nutrition plan for blood glucose control.;Long Term: Attainment of HbA1C < 7%.    Heart Failure  Yes    Intervention  Provide a combined exercise and nutrition program that is supplemented with education, support and counseling about heart failure. Directed toward relieving symptoms such as shortness of breath, decreased exercise tolerance, and extremity edema.    Expected Outcomes  Improve functional capacity of life;Short term: Attendance in program 2-3 days a week with increased exercise capacity. Reported lower sodium intake. Reported increased fruit and vegetable  intake. Reports medication compliance.;Short term: Daily weights obtained and reported for increase. Utilizing diuretic protocols set by physician.;Long term: Adoption of self-care skills and reduction of barriers for early signs and symptoms recognition and intervention leading to self-care maintenance.    Hypertension  Yes    Intervention  Provide education on lifestyle modifcations including regular physical activity/exercise, weight management, moderate sodium restriction and increased consumption of fresh fruit, vegetables, and low fat dairy, alcohol moderation, and smoking cessation.;Monitor prescription use compliance.    Expected Outcomes  Short Term: Continued assessment and intervention until BP is < 140/958mHG in hypertensive participants. < 130/8044mG in hypertensive participants with diabetes, heart failure or chronic kidney disease.;Long Term: Maintenance of blood pressure at goal levels.    Lipids  Yes    Intervention  Provide education and support for participant on nutrition & aerobic/resistive exercise along with prescribed medications to achieve LDL <55m48mDL >40mg50m Expected Outcomes  Short Term: Participant states understanding of desired cholesterol values and is compliant with medications prescribed. Participant is following exercise prescription and nutrition guidelines.;Long Term: Cholesterol controlled with medications as prescribed, with individualized exercise RX and with personalized nutrition plan. Value goals: LDL < 55mg,35m > 40 mg.  Core Components/Risk Factors/Patient Goals Review:    Core Components/Risk Factors/Patient Goals at Discharge (Final Review):    ITP Comments: ITP Comments    Row Name 04/01/18 1345 04/08/18 0836         ITP Comments  Medical Evaluation completed. Chart sent for review and changes to Dr. Emily Filbert Director of [place]. Diagnosis can be found in [facility] encounter 03/21/2018  30 day review completed. ITP sent to Dr. Emily Filbert Director of [place]. Continue with ITP unless changes are made by physician.         Comments: 30 day review

## 2018-04-10 ENCOUNTER — Encounter: Payer: Medicare Other | Admitting: *Deleted

## 2018-04-10 DIAGNOSIS — J449 Chronic obstructive pulmonary disease, unspecified: Secondary | ICD-10-CM

## 2018-04-10 LAB — GLUCOSE, CAPILLARY
GLUCOSE-CAPILLARY: 322 mg/dL — AB (ref 70–99)
GLUCOSE-CAPILLARY: 338 mg/dL — AB (ref 70–99)

## 2018-04-10 NOTE — Progress Notes (Signed)
Incomplete Session Note  Patient Details  Name: Ernest Mclean MRN: [id number] Date of Birth: [birth year] Referring Provider:     Pulmonary Rehab from 04/01/2018 in [facility] Cardiac and Pulmonary Rehab  Referring Provider  Kasa      Ernest Mclean did not complete his rehab session.  Dario's blood sugar was 322 mg/dl and we gave him water to try to drop it and he went up to 338 mg/dl.  He was able to meet with dietician today during class.

## 2018-04-12 ENCOUNTER — Encounter (INDEPENDENT_AMBULATORY_CARE_PROVIDER_SITE_OTHER): Payer: Medicare Other

## 2018-04-12 ENCOUNTER — Ambulatory Visit (INDEPENDENT_AMBULATORY_CARE_PROVIDER_SITE_OTHER): Payer: Medicare Other | Admitting: Vascular Surgery

## 2018-04-12 DIAGNOSIS — J449 Chronic obstructive pulmonary disease, unspecified: Secondary | ICD-10-CM | POA: Diagnosis not present

## 2018-04-12 LAB — GLUCOSE, CAPILLARY
Glucose-Capillary: 104 mg/dL — ABNORMAL HIGH (ref 70–99)
Glucose-Capillary: 74 mg/dL (ref 70–99)

## 2018-04-12 NOTE — Progress Notes (Signed)
Daily Session Note  Patient Details  Name: Ernest Mclean MRN: [id number] Date of Birth: 08/20/1940 Referring Provider:     Pulmonary Rehab from 04/01/2018 in [facility] Cardiac and Pulmonary Rehab  Referring Provider  Kasa      Encounter Date: 04/12/2018  Check In: Session Check In - 04/12/18 1023      Check-In   Supervising physician immediately available to respond to emergencies  [facility] immediately available ER MD    Physician(s)  Dr. Jimmye Norman and Quentin Cornwall    Location  [facility]-Cardiac & Pulmonary Rehab    Staff Present  Justin Mend RCP,RRT,BSRT;Amanda Oletta Darter, BA, ACSM CEP, Exercise Physiologist;Meredith Sherryll Burger, RN BSN    Medication changes reported      No    Fall or balance concerns reported     No    Warm-up and Cool-down  Performed as group-led Higher education careers adviser Performed  Yes    VAD Patient?  No    PAD/SET Patient?  No      Pain Assessment   Currently in Pain?  No/denies          Social History   Tobacco Use  Smoking Status Former Smoker  . Packs/day: 1.00  . Years: 30.00  . Pack years: 30.00  . Types: Cigarettes  . Last attempt to quit: 12/14/2001  . Years since quitting: 16.3  Smokeless Tobacco Never Used  Tobacco Comment   quit 12/14/2001    Goals Met:  Independence with exercise equipment Exercise tolerated well No report of cardiac concerns or symptoms Strength training completed today  Goals Unmet:  Not Applicable  Comments: Pt able to follow exercise prescription today without complaint.  Will continue to monitor for progression.    Dr. Emily Filbert is Medical Director for [place] and [facility].

## 2018-04-15 DIAGNOSIS — J449 Chronic obstructive pulmonary disease, unspecified: Secondary | ICD-10-CM | POA: Diagnosis not present

## 2018-04-15 LAB — GLUCOSE, CAPILLARY: Glucose-Capillary: 76 mg/dL (ref 70–99)

## 2018-04-15 NOTE — Progress Notes (Signed)
Daily Session Note  Patient Details  Name: Ernest Mclean MRN: [id number] Date of Birth: May 19, 1941 Referring Provider:     Pulmonary Rehab from 04/01/2018 in [facility] Cardiac and Pulmonary Rehab  Referring Provider  Kasa      Encounter Date: 04/15/2018  Check In: Session Check In - 04/15/18 1406      Check-In   Supervising physician immediately available to respond to emergencies  [facility] immediately available ER MD    Physician(s)  Dr. Joni Fears and Jimmye Norman    Location  [facility]-Cardiac & Pulmonary Rehab    Staff Present  Justin Mend RCP,RRT,BSRT;Amanda Oletta Darter, [place], ACSM CEP, Exercise Physiologist;Kelly Amedeo Plenty, BS, ACSM CEP, Exercise Physiologist    Medication changes reported      Yes    Comments  patient is off perdnisone after being sick    Fall or balance concerns reported     No    Warm-up and Cool-down  Performed as group-led instruction    Resistance Training Performed  Yes    VAD Patient?  No    PAD/SET Patient?  No      Pain Assessment   Currently in Pain?  No/denies          Social History   Tobacco Use  Smoking Status Former Smoker  . Packs/day: 1.00  . Years: 30.00  . Pack years: 30.00  . Types: Cigarettes  . Last attempt to quit: 12/14/2001  . Years since quitting: 16.3  Smokeless Tobacco Never Used  Tobacco Comment   quit 12/14/2001    Goals Met:  Independence with exercise equipment Exercise tolerated well No report of cardiac concerns or symptoms Strength training completed today  Goals Unmet:  Not Applicable  Comments: Pt able to follow exercise prescription today without complaint.  Will continue to monitor for progression.    Dr. Emily Filbert is Medical Director for [place] and [facility].

## 2018-04-17 DIAGNOSIS — J449 Chronic obstructive pulmonary disease, unspecified: Secondary | ICD-10-CM

## 2018-04-17 NOTE — Progress Notes (Signed)
Daily Session Note  Patient Details  Name: Ernest Mclean MRN: [id number] Date of Birth: [birth year] Referring Provider:     Pulmonary Rehab from 04/01/2018 in [facility] Cardiac and Pulmonary Rehab  Referring Provider  Kasa      Encounter Date: 04/17/2018  Check In: Session Check In - 04/17/18 1021      Check-In   Supervising physician immediately available to respond to emergencies  [facility] immediately available ER MD    Physician(s)  Dr. Quentin Cornwall and Darl Householder    Location  [facility]-Cardiac & Pulmonary Rehab    Staff Present  Justin Mend RCP,RRT,BSRT;Amanda Oletta Darter, BA, ACSM CEP, Exercise Physiologist;Laureen Owens Shark, BS, RRT, Respiratory Therapist    Medication changes reported      No    Fall or balance concerns reported     No    Warm-up and Cool-down  Performed as group-led instruction    Resistance Training Performed  Yes    VAD Patient?  No    PAD/SET Patient?  No      Pain Assessment   Currently in Pain?  No/denies          Social History   Tobacco Use  Smoking Status Former Smoker  . Packs/day: 1.00  . Years: 30.00  . Pack years: 30.00  . Types: Cigarettes  . Last attempt to quit: 12/14/2001  . Years since quitting: 16.3  Smokeless Tobacco Never Used  Tobacco Comment   quit 12/14/2001    Goals Met:  Independence with exercise equipment Exercise tolerated well No report of cardiac concerns or symptoms Strength training completed today  Goals Unmet:  Not Applicable  Comments: Pt able to follow exercise prescription today without complaint.  Will continue to monitor for progression.    Dr. Emily Filbert is Medical Director for [place] and [facility].

## 2018-04-22 ENCOUNTER — Encounter: Payer: Medicare Other | Attending: Internal Medicine | Admitting: *Deleted

## 2018-04-22 DIAGNOSIS — J449 Chronic obstructive pulmonary disease, unspecified: Secondary | ICD-10-CM | POA: Diagnosis present

## 2018-04-22 NOTE — Progress Notes (Signed)
Daily Session Note  Patient Details  Name: Ernest Mclean MRN: [id number] Date of Birth: 07/19/1940 Referring Provider:     Pulmonary Rehab from 04/01/2018 in [facility] Cardiac and Pulmonary Rehab  Referring Provider  Kasa      Encounter Date: 04/22/2018  Check In: Session Check In - 04/22/18 1018      Check-In   Supervising physician immediately available to respond to emergencies  [facility] immediately available ER MD    Physician(s)  Dr. Jimmye Norman and Joni Fears    Location  [facility]-Cardiac & Pulmonary Rehab    Staff Present  Earlean Shawl, BS, ACSM CEP, Exercise Physiologist;Joseph [facility], [place], ACSM CEP, Exercise Physiologist    Medication changes reported      No    Fall or balance concerns reported     No    Tobacco Cessation  No Change    Warm-up and Cool-down  Performed as group-led instruction    Resistance Training Performed  Yes    VAD Patient?  No    PAD/SET Patient?  No      Pain Assessment   Currently in Pain?  No/denies    Multiple Pain Sites  No          Social History   Tobacco Use  Smoking Status Former Smoker  . Packs/day: 1.00  . Years: 30.00  . Pack years: 30.00  . Types: Cigarettes  . Last attempt to quit: 12/14/2001  . Years since quitting: 16.3  Smokeless Tobacco Never Used  Tobacco Comment   quit 12/14/2001    Goals Met:  Proper associated with RPD/PD & O2 Sat Independence with exercise equipment Exercise tolerated well No report of cardiac concerns or symptoms Strength training completed today  Goals Unmet:  Not Applicable  Comments: Pt able to follow exercise prescription today without complaint.  Will continue to monitor for progression.    Dr. Emily Filbert is Medical Director for [place] and [facility].

## 2018-04-24 DIAGNOSIS — J449 Chronic obstructive pulmonary disease, unspecified: Secondary | ICD-10-CM

## 2018-04-24 NOTE — Progress Notes (Signed)
Daily Session Note  Patient Details  Name: Ernest Mclean MRN: [id number] Date of Birth: 1940/09/21 Referring Provider:     Pulmonary Rehab from 04/01/2018 in [facility] Cardiac and Pulmonary Rehab  Referring Provider  Kasa      Encounter Date: 04/24/2018  Check In: Session Check In - 04/24/18 1022      Check-In   Supervising physician immediately available to respond to emergencies  [facility] immediately available ER MD    Physician(s)  Dr. Jimmye Norman and Quentin Cornwall    Location  [facility]-Cardiac & Pulmonary Rehab    Staff Present  Alberteen Sam, MA, RCEP, CCRP, Exercise Physiologist;Amanda Oletta Darter, [place], ACSM CEP, Exercise Physiologist;Ashlin Kreps Tessie Fass RCP,RRT,BSRT    Medication changes reported      No    Fall or balance concerns reported     No    Warm-up and Cool-down  Performed on first and last piece of equipment    Resistance Training Performed  Yes    VAD Patient?  No    PAD/SET Patient?  No      Pain Assessment   Currently in Pain?  No/denies          Social History   Tobacco Use  Smoking Status Former Smoker  . Packs/day: 1.00  . Years: 30.00  . Pack years: 30.00  . Types: Cigarettes  . Last attempt to quit: 12/14/2001  . Years since quitting: 16.3  Smokeless Tobacco Never Used  Tobacco Comment   quit 12/14/2001    Goals Met:  Independence with exercise equipment Exercise tolerated well No report of cardiac concerns or symptoms Strength training completed today  Goals Unmet:  Not Applicable  Comments: Reviewed home exercise with pt today.  Pt plans to walk at home for exercise.  Reviewed THR, pulse, RPE, sign and symptoms, NTG use, and when to call 911 or MD.  Also discussed weather considerations and indoor options.  Pt voiced understanding.    Dr. Emily Filbert is Medical Director for [place] and [facility].

## 2018-04-26 DIAGNOSIS — J449 Chronic obstructive pulmonary disease, unspecified: Secondary | ICD-10-CM

## 2018-04-26 NOTE — Progress Notes (Signed)
Daily Session Note  Patient Details  Name: Ernest Mclean MRN: [id number] Date of Birth: 1941-01-19 Referring Provider:     Pulmonary Rehab from 04/01/2018 in [facility] Cardiac and Pulmonary Rehab  Referring Provider  Kasa      Encounter Date: 04/26/2018  Check In: Session Check In - 04/26/18 1016      Check-In   Supervising physician immediately available to respond to emergencies  [facility] immediately available ER MD    Physician(s)  Dr. Joni Fears and [facility]    Location  [facility]-Cardiac & Pulmonary Rehab    Staff Present  Justin Mend RCP,RRT,BSRT;Amanda Oletta Darter, [place], ACSM CEP, Exercise Physiologist;Meredith Sherryll Burger, RN BSN    Medication changes reported      No    Fall or balance concerns reported     No    Warm-up and Cool-down  Performed as group-led Higher education careers adviser Performed  Yes    VAD Patient?  No    PAD/SET Patient?  No      Pain Assessment   Currently in Pain?  No/denies          Social History   Tobacco Use  Smoking Status Former Smoker  . Packs/day: 1.00  . Years: 30.00  . Pack years: 30.00  . Types: Cigarettes  . Last attempt to quit: 12/14/2001  . Years since quitting: 16.3  Smokeless Tobacco Never Used  Tobacco Comment   quit 12/14/2001    Goals Met:  Independence with exercise equipment Exercise tolerated well No report of cardiac concerns or symptoms Strength training completed today  Goals Unmet:  Not Applicable  Comments: Pt able to follow exercise prescription today without complaint.  Will continue to monitor for progression.    Dr. Emily Filbert is Medical Director for [place] and [facility].

## 2018-05-01 DIAGNOSIS — J449 Chronic obstructive pulmonary disease, unspecified: Secondary | ICD-10-CM | POA: Diagnosis not present

## 2018-05-01 NOTE — Progress Notes (Signed)
Daily Session Note  Patient Details  Name: Ernest Mclean MRN: [id number] Date of Birth: [birth year] Referring Provider:     Pulmonary Rehab from 04/01/2018 in [facility] Cardiac and Pulmonary Rehab  Referring Provider  Kasa      Encounter Date: 05/01/2018  Check In: Session Check In - 05/01/18 1147      Check-In   Supervising physician immediately available to respond to emergencies  [facility] immediately available ER MD    Physician(s)  Drs. Alyse Low    Location  [facility]-Cardiac & Pulmonary Rehab    Staff Present  Alberteen Sam, MA, RCEP, CCRP, Exercise Physiologist;Joseph Foy [place], [place], ACSM CEP, Exercise Physiologist    Medication changes reported      No    Fall or balance concerns reported     No    Warm-up and Cool-down  Performed as group-led instruction    Resistance Training Performed  Yes    VAD Patient?  No    PAD/SET Patient?  No      Pain Assessment   Currently in Pain?  No/denies          Social History   Tobacco Use  Smoking Status Former Smoker  . Packs/day: 1.00  . Years: 30.00  . Pack years: 30.00  . Types: Cigarettes  . Last attempt to quit: 12/14/2001  . Years since quitting: 16.3  Smokeless Tobacco Never Used  Tobacco Comment   quit 12/14/2001    Goals Met:  Proper associated with RPD/PD & O2 Sat Independence with exercise equipment Using PLB without cueing & demonstrates good technique Exercise tolerated well No report of cardiac concerns or symptoms Strength training completed today  Goals Unmet:  Not Applicable  Comments: Pt able to follow exercise prescription today without complaint.  Will continue to monitor for progression.    Dr. Emily Filbert is Medical Director for [name] and [facility].

## 2018-05-03 ENCOUNTER — Encounter: Payer: Medicare Other | Admitting: *Deleted

## 2018-05-03 DIAGNOSIS — J449 Chronic obstructive pulmonary disease, unspecified: Secondary | ICD-10-CM

## 2018-05-03 NOTE — Progress Notes (Signed)
Daily Session Note  Patient Details  Name: Ernest Mclean MRN: [id number] Date of Birth: [birth year] Referring Provider:     Pulmonary Rehab from 04/01/2018 in [facility] Cardiac and Pulmonary Rehab  Referring Provider  Kasa      Encounter Date: 05/03/2018  Check In: Session Check In - 05/03/18 1015      Check-In   Supervising physician immediately available to respond to emergencies  [facility] immediately available ER MD    Physician(s)  Dr. Cherylann Banas and Burlene Arnt    Location  [facility]-Cardiac & Pulmonary Rehab    Staff Present  Renita Papa, RN Vickki Hearing, BA, ACSM CEP, Exercise Physiologist;Joseph Tessie Fass RCP,RRT,BSRT    Medication changes reported      No    Fall or balance concerns reported     No    Warm-up and Cool-down  Performed as group-led instruction    Resistance Training Performed  Yes    VAD Patient?  No    PAD/SET Patient?  No      Pain Assessment   Currently in Pain?  No/denies          Social History   Tobacco Use  Smoking Status Former Smoker  . Packs/day: 1.00  . Years: 30.00  . Pack years: 30.00  . Types: Cigarettes  . Last attempt to quit: 12/14/2001  . Years since quitting: 16.3  Smokeless Tobacco Never Used  Tobacco Comment   quit 12/14/2001    Goals Met:  Proper associated with RPD/PD & O2 Sat Independence with exercise equipment Using PLB without cueing & demonstrates good technique Exercise tolerated well No report of cardiac concerns or symptoms Strength training completed today  Goals Unmet:  Not Applicable  Comments: Pt able to follow exercise prescription today without complaint.  Will continue to monitor for progression.    Dr. Emily Filbert is Medical Director for [place] and [facility].

## 2018-05-06 DIAGNOSIS — J449 Chronic obstructive pulmonary disease, unspecified: Secondary | ICD-10-CM

## 2018-05-06 NOTE — Progress Notes (Signed)
Pulmonary Individual Treatment Plan  Patient Details  Name: Ernest Mclean MRN: [id number] Date of Birth: 11-19-1940 Referring Provider:     Pulmonary Rehab from 04/01/2018 in [facility] Cardiac and Pulmonary Rehab  Referring Provider  Kasa      Initial Encounter Date:    Pulmonary Rehab from 04/01/2018 in [facility] Cardiac and Pulmonary Rehab  Date  04/01/18      Visit Diagnosis: Chronic obstructive pulmonary disease, unspecified COPD type ([place])  Patient's Home Medications on Admission:  Current Outpatient Medications:  .  albuterol (PROVENTIL HFA;VENTOLIN HFA) 108 (90 Base) MCG/ACT inhaler, Inhale 2 puffs into the lungs every 4 (four) hours as needed for wheezing or shortness of breath., Disp: 25.5 Inhaler, Rfl: 1 .  amLODipine (NORVASC) 5 MG tablet, Take 5 mg by mouth daily. , Disp: , Rfl:  .  ammonium lactate (AMLACTIN) 12 % cream, Apply 1 g topically as needed for dry skin., Disp: , Rfl:  .  apixaban (ELIQUIS) 5 MG TABS tablet, Take 5 mg by mouth 2 (two) times daily., Disp: , Rfl:  .  aspirin EC 81 MG tablet, Take 81 mg by mouth daily., Disp: , Rfl:  .  citalopram (CELEXA) 20 MG tablet, Take 20 mg by mouth at bedtime. , Disp: , Rfl: 5 .  CRANBERRY PO, Take 820 mg by mouth daily., Disp: , Rfl:  .  cyanocobalamin 500 MCG tablet, Take 500 mcg by mouth daily., Disp: , Rfl:  .  desonide (DESOWEN) 0.05 % cream, Apply topically., Disp: , Rfl:  .  diazepam (VALIUM) 5 MG tablet, Take 5 mg by mouth every 12 (twelve) hours as needed for anxiety. , Disp: , Rfl:  .  ergocalciferol (VITAMIN D2) 50000 units capsule, Take 50,000 Units by mouth every 30 (thirty) days., Disp: , Rfl:  .  furosemide (LASIX) 20 MG tablet, Take 20 mg by mouth daily. Take one tablet daily.  Can take extra tablet in afternoon if increase weight gain of 3 pounds or increased swelling., Disp: , Rfl:  .  insulin glargine (LANTUS) 100 UNIT/ML injection, Inject 70 Units into the skin at bedtime., Disp: , Rfl:  .  insulin lispro  (HUMALOG) 100 UNIT/ML injection, Inject 15-25 Units into the skin 3 (three) times daily before meals. 15 units before breakfast, 25 units before lunch, and 25 units before supper plus additional units for sliding scale, Disp: , Rfl:  .  ketoconazole (NIZORAL) 2 % shampoo, Apply 1 application topically 2 (two) times a week., Disp: , Rfl:  .  lisinopril (PRINIVIL,ZESTRIL) 5 MG tablet, Take 5 mg by mouth daily., Disp: , Rfl:  .  metFORMIN (GLUCOPHAGE) 500 MG tablet, Take 1,000 mg by mouth every evening. , Disp: , Rfl:  .  metoprolol (LOPRESSOR) 100 MG tablet, Take 100 mg by mouth 2 (two) times daily., Disp: , Rfl: 0 .  OMEGA-3 FATTY ACIDS-VITAMIN E PO, Take 1 capsule by mouth daily., Disp: , Rfl:  .  pravastatin (PRAVACHOL) 20 MG tablet, Take 20 mg by mouth at bedtime., Disp: , Rfl: 3 .  tiotropium (SPIRIVA) 18 MCG inhalation capsule, Place 18 mcg into inhaler and inhale daily., Disp: , Rfl:  .  vitamin C (ASCORBIC ACID) 500 MG tablet, Take 500 mg by mouth daily., Disp: , Rfl:  .  WIXELA INHUB 500-50 MCG/DOSE AEPB, TAKE 1 PUFF BY MOUTH TWICE A DAY, Disp: 180 each, Rfl: 1  Past Medical History: Past Medical History:  Diagnosis Date  . Atrial fibrillation ([place])   . Basal  cell carcinoma 06/2017   Left Ear  . CAD (coronary artery disease)   . CKD (chronic kidney disease)   . Congestive heart failure ([place])   . COPD (chronic obstructive pulmonary disease) ([place])   . Diabetes ([place])   . Hypertension   . OSA on CPAP   . 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 Packs/day: 1.00  . Years: 30.00  . Pack years: 30.00  . Types: Cigarettes  . Last attempt to quit: 12/14/2001  . Years since quitting: 16.4  Smokeless Tobacco Never Used  Tobacco Comment   quit 12/14/2001    Labs: Recent Review Flowsheet Data    Labs for ITP Cardiac and Pulmonary Rehab Latest Ref Rng & Units 06/20/2014 06/23/2014 05/13/2015 08/01/2016    Cholestrol 0 - 200 mg/dL 160 - - -   LDLCALC 0 - 100 mg/dL 70 - - -   HDL 40 - 60 mg/dL 28(L) - - -   Trlycerides 0 - 200 mg/dL 311(H) - - -   Hemoglobin A1c 4.8 - 5.6 % - 10.8(H) 7.9(H) 8.4(H)   HCO3 20.0 - 28.0 mmol/L - - - 32.4(H)       Pulmonary Assessment Scores: Pulmonary Assessment Scores    Row Name 04/01/18 1412         ADL UCSD   ADL Phase  Entry     SOB Score total  3     Rest  0     Walk  0     Stairs  0     Bath  0     Dress  0     Shop  0       CAT Score   CAT Score  4       mMRC Score   mMRC Score  1        Pulmonary Function Assessment: Pulmonary Function Assessment - 04/01/18 1413      Initial Spirometry Results   FVC%  64 %    FEV1%  68 %    FEV1/FVC Ratio  77    Comments  test performed on 02/10/2015      Post Bronchodilator Spirometry Results   FVC%  70 %    FEV1%  75 %    FEV1/FVC Ratio  78    Comments  test performed on 02/10/2015      Breath   Bilateral Breath Sounds  Clear    Shortness of Breath  Yes;Limiting activity       Exercise Target Goals: Exercise Program Goal: Individual exercise prescription set using results from initial 6 min walk test and THRR while considering  patient's activity barriers and safety.   Exercise Prescription Goal: Initial exercise prescription builds to 30-45 minutes a day of aerobic activity, 2-3 days per week.  Home exercise guidelines will be given to patient during program as part of exercise prescription that the participant will acknowledge.  Activity Barriers & Risk Stratification:   6 Minute Walk: 6 Minute Walk    Row Name 04/01/18 1606         6 Minute Walk   Phase  Initial     Distance  733 feet     Walk Time  6 minutes     # of Rest Breaks  0     MPH  1.39     METS  1.45  RPE  13     Perceived Dyspnea   0     VO2 Peak  5.06     Symptoms  Yes (comment)     Comments  R calf pain     Resting HR  61 bpm     Resting BP  148/64     Resting Oxygen Saturation   95 %      Exercise Oxygen Saturation  during 6 min walk  92 %     Max Ex. HR  76 bpm     Max Ex. BP  156/60     2 Minute Post BP  142/64       Interval HR   1 Minute HR  59     2 Minute HR  58     3 Minute HR  61     5 Minute HR  65     6 Minute HR  76     2 Minute Post HR  59     Interval Heart Rate?  Yes       Interval Oxygen   Interval Oxygen?  Yes     Baseline Oxygen Saturation %  95 %     1 Minute Oxygen Saturation %  93 %     1 Minute Liters of Oxygen  0 L     2 Minute Oxygen Saturation %  94 %     2 Minute Liters of Oxygen  0 L     3 Minute Oxygen Saturation %  93 %     3 Minute Liters of Oxygen  0 L     4 Minute Liters of Oxygen  0 L     5 Minute Oxygen Saturation %  92 %     5 Minute Liters of Oxygen  0 L     6 Minute Oxygen Saturation %  94 %     6 Minute Liters of Oxygen  0 L     2 Minute Post Oxygen Saturation %  96 %     2 Minute Post Liters of Oxygen  0 L       Oxygen Initial Assessment: Oxygen Initial Assessment - 04/01/18 1356      Home Oxygen   Home Oxygen Device  Home Concentrator;E-Tanks    Sleep Oxygen Prescription  Continuous    Liters per minute  2    Home Exercise Oxygen Prescription  None    Home at Rest Exercise Oxygen Prescription  None    Compliance with Home Oxygen Use  Yes      Initial 6 min Walk   Oxygen Used  None      Program Oxygen Prescription   Program Oxygen Prescription  None      Intervention   Short Term Goals  To learn and demonstrate proper use of respiratory medications;To learn and demonstrate proper pursed lip breathing techniques or other breathing techniques.;To learn and understand importance of maintaining oxygen saturations>88%;To learn and understand importance of monitoring SPO2 with pulse oximeter and demonstrate accurate use of the pulse oximeter.;To learn and exhibit compliance with exercise, home and travel O2 prescription    Long  Term Goals  Exhibits compliance with exercise, home and travel O2 prescription;Verbalizes  importance of monitoring SPO2 with pulse oximeter and return demonstration;Maintenance of O2 saturations>88%;Exhibits proper breathing techniques, such as pursed lip breathing or other method taught during program session;Compliance with respiratory medication;Demonstrates proper use of MDI's       Oxygen Re-Evaluation:  Oxygen Re-Evaluation    Row Name 04/05/18 1016 04/15/18 1452           Program Oxygen Prescription   Program Oxygen Prescription  None  None        Home Oxygen   Home Oxygen Device  Home Concentrator;E-Tanks  Home Concentrator;E-Tanks      Sleep Oxygen Prescription  Continuous  Continuous      Liters per minute  2  2      Home Exercise Oxygen Prescription  None  None      Home at Rest Exercise Oxygen Prescription  None  None      Compliance with Home Oxygen Use  Yes  Yes        Goals/Expected Outcomes   Short Term Goals  To learn and demonstrate proper use of respiratory medications;To learn and demonstrate proper pursed lip breathing techniques or other breathing techniques.;To learn and understand importance of maintaining oxygen saturations>88%;To learn and understand importance of monitoring SPO2 with pulse oximeter and demonstrate accurate use of the pulse oximeter.;To learn and exhibit compliance with exercise, home and travel O2 prescription  To learn and demonstrate proper use of respiratory medications;To learn and demonstrate proper pursed lip breathing techniques or other breathing techniques.;To learn and understand importance of maintaining oxygen saturations>88%;To learn and understand importance of monitoring SPO2 with pulse oximeter and demonstrate accurate use of the pulse oximeter.;To learn and exhibit compliance with exercise, home and travel O2 prescription      Long  Term Goals  Exhibits compliance with exercise, home and travel O2 prescription;Verbalizes importance of monitoring SPO2 with pulse oximeter and return demonstration;Maintenance of O2  saturations>88%;Exhibits proper breathing techniques, such as pursed lip breathing or other method taught during program session;Compliance with respiratory medication;Demonstrates proper use of MDI's  Exhibits compliance with exercise, home and travel O2 prescription;Verbalizes importance of monitoring SPO2 with pulse oximeter and return demonstration;Maintenance of O2 saturations>88%;Exhibits proper breathing techniques, such as pursed lip breathing or other method taught during program session;Compliance with respiratory medication;Demonstrates proper use of MDI's      Comments  Reviewed PLB technique with pt.  Talked about how it work and it's important to maintaining his exercise saturations.    Jedidiah checks his oxygen at home with his own pulse oximeter. He wears his CPAP with a 2 liter bleed in every night. He knows what his resting heart rate is and what his oxygen runs at home. Informed him to check his oxygen when he is exerting himself to make sure he is staying above 88 percent.      Goals/Expected Outcomes  Short: Become more profiecient at using PLB.   Long: Become independent at using PLB.  Short: monitor oxygen at home with exertion. Long: maintain oxygen saturations above 88 percent independently.         Oxygen Discharge (Final Oxygen Re-Evaluation): Oxygen Re-Evaluation - 04/15/18 1452      Program Oxygen Prescription   Program Oxygen Prescription  None      Home Oxygen   Home Oxygen Device  Home Concentrator;E-Tanks    Sleep Oxygen Prescription  Continuous    Liters per minute  2    Home Exercise Oxygen Prescription  None    Home at Rest Exercise Oxygen Prescription  None    Compliance with Home Oxygen Use  Yes      Goals/Expected Outcomes   Short Term Goals  To learn and demonstrate proper use of respiratory medications;To learn and demonstrate proper pursed lip breathing  techniques or other breathing techniques.;To learn and understand importance of maintaining oxygen  saturations>88%;To learn and understand importance of monitoring SPO2 with pulse oximeter and demonstrate accurate use of the pulse oximeter.;To learn and exhibit compliance with exercise, home and travel O2 prescription    Long  Term Goals  Exhibits compliance with exercise, home and travel O2 prescription;Verbalizes importance of monitoring SPO2 with pulse oximeter and return demonstration;Maintenance of O2 saturations>88%;Exhibits proper breathing techniques, such as pursed lip breathing or other method taught during program session;Compliance with respiratory medication;Demonstrates proper use of MDI's    Comments  Adrian checks his oxygen at home with his own pulse oximeter. He wears his CPAP with a 2 liter bleed in every night. He knows what his resting heart rate is and what his oxygen runs at home. Informed him to check his oxygen when he is exerting himself to make sure he is staying above 88 percent.    Goals/Expected Outcomes  Short: monitor oxygen at home with exertion. Long: maintain oxygen saturations above 88 percent independently.       Initial Exercise Prescription: Initial Exercise Prescription - 04/01/18 1600      Date of Initial Exercise RX and Referring Provider   Date  04/01/18    Referring Provider  Kasa      Treadmill   MPH  1    Grade  0    Minutes  15   as tolerated - stop when needed to rest   METs  1.77      NuStep   Level  1    SPM  80    Minutes  15    METs  1.5      REL-XR   Level  1    Speed  50    Minutes  15    METs  1.5      Prescription Details   Frequency (times per week)  3    Duration  Progress to 45 minutes of aerobic exercise without signs/symptoms of physical distress      Intensity   THRR 40-80% of Max Heartrate  94-128    Ratings of Perceived Exertion  11-13    Perceived Dyspnea  0-4      Resistance Training   Training Prescription  Yes    Weight  3 lb    Reps  10-15       Perform Capillary Blood Glucose checks as  needed.  Exercise Prescription Changes: Exercise Prescription Changes    Row Name 04/11/18 0800 04/25/18 0900           Response to Exercise   Blood Pressure (Admit)  130/60  128/60      Blood Pressure (Exercise)  142/80  -      Blood Pressure (Exit)  96/54  124/64      Heart Rate (Admit)  60 bpm  58 bpm      Heart Rate (Exercise)  60 bpm  120 bpm      Heart Rate (Exit)  63 bpm  62 bpm      Oxygen Saturation (Admit)  95 %  96 %      Oxygen Saturation (Exercise)  97 %  96 %      Oxygen Saturation (Exit)  96 %  98 %      Rating of Perceived Exertion (Exercise)  13  13      Perceived Dyspnea (Exercise)  1  1      Symptoms  none  none  Comments  day 2 exercise  -      Duration  Continue with 45 min of aerobic exercise without signs/symptoms of physical distress.  Continue with 45 min of aerobic exercise without signs/symptoms of physical distress.      Intensity  Other (comment) not reaching THR but RPE 13  Other (comment) HR 120 not usual        Progression   Progression  Continue to progress workloads to maintain intensity without signs/symptoms of physical distress.  Continue to progress workloads to maintain intensity without signs/symptoms of physical distress.      Average METs  2.13  2        Resistance Training   Training Prescription  Yes  Yes      Weight  3 lb  3 lb      Reps  10-15  10-15        Treadmill   MPH  1  1      Grade  0  0      Minutes  15  15      METs  1.77  1.77        NuStep   Level  1  1      SPM  80  80      Minutes  15  15      METs  3  2.4        REL-XR   Level  1  -      Speed  50  -      Minutes  15  -      METs  1.7  -         Exercise Comments: Exercise Comments    Row Name 04/05/18 1016 04/24/18 1051         Exercise Comments  First full day of exercise!  Patient was oriented to gym and equipment including functions, settings, policies, and procedures.  Patient's individual exercise prescription and treatment plan were  reviewed.  All starting workloads were established based on the results of the 6 minute walk test done at initial orientation visit.  The plan for exercise progression was also introduced and progression will be customized based on patient's performance and goals.   Reviewed home exercise with pt today.  Pt plans to walk at home for exercise.  Reviewed THR, pulse, RPE, sign and symptoms, NTG use, and when to call 911 or MD.  Also discussed weather considerations and indoor options.  Pt voiced understanding.         Exercise Goals and Review: Exercise Goals    Row Name 04/01/18 1605             Exercise Goals   Increase Physical Activity  Yes       Intervention  Provide advice, education, support and counseling about physical activity/exercise needs.;Develop an individualized exercise prescription for aerobic and resistive training based on initial evaluation findings, risk stratification, comorbidities and participant's personal goals.       Expected Outcomes  Short Term: Attend rehab on a regular basis to increase amount of physical activity.;Long Term: Add in home exercise to make exercise part of routine and to increase amount of physical activity.;Long Term: Exercising regularly at least 3-5 days a week.       Increase Strength and Stamina  Yes       Intervention  Provide advice, education, support and counseling about physical activity/exercise needs.;Develop an individualized exercise prescription for aerobic and  resistive training based on initial evaluation findings, risk stratification, comorbidities and participant's personal goals.       Expected Outcomes  Short Term: Increase workloads from initial exercise prescription for resistance, speed, and METs.;Short Term: Perform resistance training exercises routinely during rehab and add in resistance training at home;Long Term: Improve cardiorespiratory fitness, muscular endurance and strength as measured by increased METs and functional  capacity (6MWT)       Able to understand and use rate of perceived exertion (RPE) scale  Yes       Intervention  Provide education and explanation on how to use RPE scale       Expected Outcomes  Long Term:  Able to use RPE to guide intensity level when exercising independently;Short Term: Able to use RPE daily in rehab to express subjective intensity level       Able to understand and use Dyspnea scale  Yes       Intervention  Provide education and explanation on how to use Dyspnea scale       Expected Outcomes  Short Term: Able to use Dyspnea scale daily in rehab to express subjective sense of shortness of breath during exertion;Long Term: Able to use Dyspnea scale to guide intensity level when exercising independently       Knowledge and understanding of Target Heart Rate Range (THRR)  Yes       Intervention  Provide education and explanation of THRR including how the numbers were predicted and where they are located for reference       Expected Outcomes  Short Term: Able to state/look up THRR;Short Term: Able to use daily as guideline for intensity in rehab;Long Term: Able to use THRR to govern intensity when exercising independently       Able to check pulse independently  Yes       Intervention  Provide education and demonstration on how to check pulse in carotid and radial arteries.;Review the importance of being able to check your own pulse for safety during independent exercise       Expected Outcomes  Short Term: Able to explain why pulse checking is important during independent exercise;Long Term: Able to check pulse independently and accurately       Understanding of Exercise Prescription  Yes       Intervention  Provide education, explanation, and written materials on patient's individual exercise prescription       Expected Outcomes  Short Term: Able to explain program exercise prescription;Long Term: Able to explain home exercise prescription to exercise independently           Exercise Goals Re-Evaluation : Exercise Goals Re-Evaluation    Row Name 04/05/18 1016 04/11/18 0851 04/24/18 1051 04/25/18 0946       Exercise Goal Re-Evaluation   Exercise Goals Review  Able to understand and use rate of perceived exertion (RPE) scale;Able to understand and use Dyspnea scale;Knowledge and understanding of Target Heart Rate Range (THRR);Understanding of Exercise Prescription  Increase Physical Activity;Increase Strength and Stamina;Able to understand and use rate of perceived exertion (RPE) scale;Able to understand and use Dyspnea scale  Increase Physical Activity;Able to understand and use rate of perceived exertion (RPE) scale;Knowledge and understanding of Target Heart Rate Range (THRR);Understanding of Exercise Prescription;Increase Strength and Stamina;Able to understand and use Dyspnea scale;Able to check pulse independently  Increase Physical Activity;Increase Strength and Stamina;Able to understand and use rate of perceived exertion (RPE) scale;Able to understand and use Dyspnea scale;Knowledge and understanding of Target Heart Rate  Range (THRR);Able to check pulse independently;Understanding of Exercise Prescription    Comments  Reviewed RPE scale, THR and program prescription with pt today.  Pt voiced understanding and was given a copy of goals to take home.   Shaheen has tolerated exercise well in first sessions.  Staff will monitor progress.   Reviewed home exercise with pt today.  Pt plans to walk at home for exercise.  Reviewed THR, pulse, RPE, sign and symptoms, NTG use, and when to call 911 or MD.  Also discussed weather considerations and indoor options.  Pt voiced understanding.  Naresh increased levels on NS and plasn to increase weight strength training next session.      Expected Outcomes  Short: Use RPE daily to regulate intensity. Long: Follow program prescription in THR.  Short - attend consistently Long - increase overall MET level  Short - increase walking time  at home from 15-30 min Long - maintain exercise on his own  Short - increase to 6 lb weights for strength Long - increase MET level       Discharge Exercise Prescription (Final Exercise Prescription Changes): Exercise Prescription Changes - 04/25/18 0900      Response to Exercise   Blood Pressure (Admit)  128/60    Blood Pressure (Exit)  124/64    Heart Rate (Admit)  58 bpm    Heart Rate (Exercise)  120 bpm    Heart Rate (Exit)  62 bpm    Oxygen Saturation (Admit)  96 %    Oxygen Saturation (Exercise)  96 %    Oxygen Saturation (Exit)  98 %    Rating of Perceived Exertion (Exercise)  13    Perceived Dyspnea (Exercise)  1    Symptoms  none    Duration  Continue with 45 min of aerobic exercise without signs/symptoms of physical distress.    Intensity  Other (comment)   HR 120 not usual     Progression   Progression  Continue to progress workloads to maintain intensity without signs/symptoms of physical distress.    Average METs  2      Resistance Training   Training Prescription  Yes    Weight  3 lb    Reps  10-15      Treadmill   MPH  1    Grade  0    Minutes  15    METs  1.77      NuStep   Level  1    SPM  80    Minutes  15    METs  2.4       Nutrition:  Target Goals: Understanding of nutrition guidelines, daily intake of sodium <1517m, cholesterol <2034m calories 30% from fat and 7% or less from saturated fats, daily to have 5 or more servings of fruits and vegetables.  Biometrics: Pre Biometrics - 04/01/18 1604      Pre Biometrics   Height  _0  (1.854 m)    Weight  229 lb 8 oz (104.1 kg)    Waist Circumference  44.5 inches    Hip Circumference  43 inches    Waist to Hip Ratio  1.03 %    BMI (Calculated)  30.29        Nutrition Therapy Plan and Nutrition Goals: Nutrition Therapy & Goals - 04/10/18 1250      Nutrition Therapy   Diet  DM    Protein (specify units)  12-13oz    Fiber  35 grams  Whole Grain Foods  3 servings   eats whole grains  regularly   Saturated Fats  15 max. grams    Fruits and Vegetables  6 servings/day   8 ideal. eats fruits and vegetables regularly   Sodium  2000 grams   per MD recommendation, follows 2g sodium diet strictly     Personal Nutrition Goals   Nutrition Goal  Experiment with breakfast options that are a combination of carbohydrate + protein rather than predominantly carbohydrate in order to prevent hyperglycemia prior to exercise    Personal Goal #2  Continue to work with your wife to keep sodium intake between 1500 and 2072m/day. Great job for finding lower sodium ingredient swaps and recipes!    Comments  He experiences dramatic BG changes within a matter of minutes, often dropping from the 300 range to 60 or even as low as 40. His wife tightly monitor his diet and keeps sodium intake lower than 20025mday. Pt typically eats 5 CHO servings per meal and 1-2 at snacks for a total of 17-18 CHO servings per day, which they state was recommended by endocrynologist. Wife weighs out foods and makes most meals from scratch using low sodium ingredients. They eat fruits and vegetables daily and monitor potassium intake in addition to sodium r/t CKD. They try to avoid foods with preservatives and try to buy meats from local farms. If they eat out, wife looks up nutrition information ahead of time. HgbA1c 7.7 "best it's been in years" 3 weeks ago per wife. Breakfast: shredded wheat cereal, oatmeal, oatmeal muffins, eggs / sausage occasionally. Snacks: NSA Klondike bar, fruit, unsalted nuts. If he experiences hypoglycemia he corrects with applejuice which is effective.      Intervention Plan   Intervention  Prescribe, educate and counsel regarding individualized specific dietary modifications aiming towards targeted core components such as weight, hypertension, lipid management, diabetes, heart failure and other comorbidities.    Expected Outcomes  Short Term Goal: A plan has been developed with personal nutrition  goals set during dietitian appointment.;Long Term Goal: Adherence to prescribed nutrition plan.;Short Term Goal: Understand basic principles of dietary content, such as calories, fat, sodium, cholesterol and nutrients.       Nutrition Assessments: Nutrition Assessments - 04/01/18 1407      MEDFICTS Scores   Pre Score  24       Nutrition Goals Re-Evaluation: Nutrition Goals Re-Evaluation    [place] 04/10/18 1332             Goals   Nutrition Goal  Continue to work with your wife to keep sodium intake between 1500 and 200065may. Great job for finding lower sodium ingredient swaps and recipes!       Comment  His wife monitors his diet tightly, weighing out foods, making recipes from scratch, reading labels, and looking up nutrition information at restaurants ahead of time       Expected Outcome  Continue to limit sodium intake, keeping below [id number] when possible         Personal Goal #2 Re-Evaluation   Personal Goal #2  Experiment with breakfast options that are a combination of carbohydrate and protein rather than predominantly carbohydrate in order to prevent hyperglycemia prior to exercise          Nutrition Goals Discharge (Final Nutrition Goals Re-Evaluation): Nutrition Goals Re-Evaluation - 04/10/18 1332      Goals   Nutrition Goal  Continue to work with your wife to keep sodium intake between 1500 and  2062m/day. Great job for finding lower sodium ingredient swaps and recipes!    Comment  His wife monitors his diet tightly, weighing out foods, making recipes from scratch, reading labels, and looking up nutrition information at restaurants ahead of time    Expected Outcome  Continue to limit sodium intake, keeping below 20019mday when possible      Personal Goal #2 Re-Evaluation   Personal Goal #2  Experiment with breakfast options that are a combination of carbohydrate and protein rather than predominantly carbohydrate in order to prevent hyperglycemia prior to  exercise       Psychosocial: Target Goals: Acknowledge presence or absence of significant depression and/or stress, maximize coping skills, provide positive support system. Participant is able to verbalize types and ability to use techniques and skills needed for reducing stress and depression.   Initial Review & Psychosocial Screening: Initial Psych Review & Screening - 04/01/18 1357      Initial Review   Current issues with  Current Stress Concerns    Source of Stress Concerns  Chronic Illness    Comments  Going to the doctors and going to funerals make his stressed out. His breathing and there are things that he cant do anymore.      Family Dynamics   Good Support System?  Yes    Comments  His wife of [id number] is his support system.      Barriers   Psychosocial barriers to participate in program  There are no identifiable barriers or psychosocial needs.;The patient should benefit from training in stress management and relaxation.      Screening Interventions   Interventions  Encouraged to exercise;Program counselor consult;Provide feedback about the scores to participant;To provide support and resources with identified psychosocial needs    Expected Outcomes  Short Term goal: Utilizing psychosocial counselor, staff and physician to assist with identification of specific Stressors or current issues interfering with healing process. Setting desired goal for each stressor or current issue identified.;Long Term Goal: Stressors or current issues are controlled or eliminated.;Short Term goal: Identification and review with participant of any Quality of Life or Depression concerns found by scoring the questionnaire.;Long Term goal: The participant improves quality of Life and PHQ9 Scores as seen by post scores and/or verbalization of changes       Quality of Life Scores:  Scores of 19 and below usually indicate a poorer quality of life in these areas.  A difference of  2-3 points is a  clinically meaningful difference.  A difference of 2-3 points in the total score of the Quality of Life Index has been associated with significant improvement in overall quality of life, self-image, physical symptoms, and general health in studies assessing change in quality of life.  PHQ-9: Recent Review Flowsheet Data    [facility] 04/22/2018 04/01/2018 07/16/2017 03/19/2017 11/15/2016   Decreased Interest 0 0 0 0 0   Down, Depressed, Hopeless 0 0 0 0 0   PHQ - 2 Score 0 0 0 0 0   Altered sleeping 0 0 - - -   Tired, decreased energy 0 1 - - -   Change in appetite 0 0 - - -   Feeling bad or failure about yourself  0 0 - - -   Trouble concentrating 0 0 - - -   Moving slowly or fidgety/restless 0 0 - - -   Suicidal thoughts 0 0 - - -   PHQ-9 Score 0 1 - - -  Difficult doing work/chores Not difficult at all Not difficult at all - - -     Interpretation of Total Score  Total Score Depression Severity:  1-4 = Minimal depression, 5-9 = Mild depression, 10-14 = Moderate depression, 15-19 = Moderately severe depression, 20-27 = Severe depression   Psychosocial Evaluation and Intervention: Psychosocial Evaluation - 04/22/18 1118      Psychosocial Evaluation & Interventions   Interventions  Encouraged to exercise with the program and follow exercise prescription    Comments  Counselor follow up with Mr. Oien Sposito) today much progress since coming into this program.  The blood flow to his right leg in particulary has improved significantly.  He has learned much from the education and particularly the dietician and nutritional aspects.  He continues to sleep well and manage his anxiety with medications and exercise.  He reports the staff here are extremely helpful and knowledgeable and he feels confident in their care.  Counselor commended Karl on his progress made.  He has lost several pounds and that is quite an accomplishment with being the week after Thanksgiving!  Staff will  continue to follow.    Expected Outcomes  Short:  Johndavid will continue to exercise to improve his blood flow; decrease his anxiety and for his overall health.  Long:  Jarrick will continue to make positive nutritional choices for his weight loss goal.     Continue Psychosocial Services   Follow up required by staff       Psychosocial Re-Evaluation:   Psychosocial Discharge (Final Psychosocial Re-Evaluation):   Education: Education Goals: Education classes will be provided on a weekly basis, covering required topics. Participant will state understanding/return demonstration of topics presented.  Learning Barriers/Preferences: Learning Barriers/Preferences - 04/01/18 1402      Learning Barriers/Preferences   Learning Barriers  Sight   wears glasses   Learning Preferences  None       Education Topics:  Initial Evaluation Education: - Verbal, written and demonstration of respiratory meds, oximetry and breathing techniques. Instruction on use of nebulizers and MDIs and importance of monitoring MDI activations.   Pulmonary Rehab from 05/01/2018 in [facility] Cardiac and Pulmonary Rehab  Date  04/01/18  Educator  [facility]  Instruction Review Code  1- Verbalizes Understanding      General Nutrition Guidelines/Fats and Fiber: -Group instruction provided by verbal, written material, models and posters to present the general guidelines for heart healthy nutrition. Gives an explanation and review of dietary fats and fiber.   Controlling Sodium/Reading Food Labels: -Group verbal and written material supporting the discussion of sodium use in heart healthy nutrition. Review and explanation with models, verbal and written materials for utilization of the food label.   Exercise Physiology & General Exercise Guidelines: - Group verbal and written instruction with models to review the exercise physiology of the cardiovascular system and associated critical values. Provides general exercise guidelines  with specific guidelines to those with heart or lung disease.    Pulmonary Rehab from 05/01/2018 in [facility] Cardiac and Pulmonary Rehab  Date  04/26/18  Educator  [facility]  Instruction Review Code  1- Verbalizes Understanding      Aerobic Exercise & Resistance Training: - Gives group verbal and written instruction on the various components of exercise. Focuses on aerobic and resistive training programs and the benefits of this training and how to safely progress through these programs.   Flexibility, Balance, Mind/Body Relaxation: Provides group verbal/written instruction on the benefits of flexibility and balance training, including mind/body  exercise modes such as yoga, pilates and tai chi.  Demonstration and skill practice provided.   Stress and Anxiety: - Provides group verbal and written instruction about the health risks of elevated stress and causes of high stress.  Discuss the correlation between heart/lung disease and anxiety and treatment options. Review healthy ways to manage with stress and anxiety.   Depression: - Provides group verbal and written instruction on the correlation between heart/lung disease and depressed mood, treatment options, and the stigmas associated with seeking treatment.   Exercise & Equipment Safety: - Individual verbal instruction and demonstration of equipment use and safety with use of the equipment.   Pulmonary Rehab from 05/01/2018 in [facility] Cardiac and Pulmonary Rehab  Date  04/01/18  Educator  [facility]  Instruction Review Code  1- Verbalizes Understanding      Infection Prevention: - Provides verbal and written material to individual with discussion of infection control including proper hand washing and proper equipment cleaning during exercise session.   Pulmonary Rehab from 05/01/2018 in [facility] Cardiac and Pulmonary Rehab  Date  04/01/18  Educator  [facility]  Instruction Review Code  1- Verbalizes Understanding      Falls Prevention: - Provides verbal and  written material to individual with discussion of falls prevention and safety.   Pulmonary Rehab from 05/01/2018 in [facility] Cardiac and Pulmonary Rehab  Date  04/01/18  Educator  [facility]  Instruction Review Code  1- Verbalizes Understanding      Diabetes: - Individual verbal and written instruction to review signs/symptoms of diabetes, desired ranges of glucose level fasting, after meals and with exercise. Advice that pre and post exercise glucose checks will be done for 3 sessions at entry of program.   Chronic Lung Diseases: - Group verbal and written instruction to review updates, respiratory medications, advancements in procedures and treatments. Discuss use of supplemental oxygen including available portable oxygen systems, continuous and intermittent flow rates, concentrators, personal use and safety guidelines. Review proper use of inhaler and spacers. Provide informative websites for self-education.    Pulmonary Rehab from 05/01/2018 in [facility] Cardiac and Pulmonary Rehab  Date  04/24/18  Educator  [facility]  Instruction Review Code  1- Verbalizes Understanding      Energy Conservation: - Provide group verbal and written instruction for methods to conserve energy, plan and organize activities. Instruct on pacing techniques, use of adaptive equipment and posture/positioning to relieve shortness of breath.   Triggers and Exacerbations: - Group verbal and written instruction to review types of environmental triggers and ways to prevent exacerbations. Discuss weather changes, air quality and the benefits of nasal washing. Review warning signs and symptoms to help prevent infections. Discuss techniques for effective airway clearance, coughing, and vibrations.   AED/CPR: - Group verbal and written instruction with the use of models to demonstrate the basic use of the AED with the basic ABC's of resuscitation.   Anatomy and Physiology of the Lungs: - Group verbal and written instruction with the use  of models to provide basic lung anatomy and physiology related to function, structure and complications of lung disease.   Pulmonary Rehab from 05/01/2018 in [facility] Cardiac and Pulmonary Rehab  Date  04/05/18  Educator  [facility]  Instruction Review Code  1- Verbalizes Understanding      Anatomy & Physiology of the Heart: - Group verbal and written instruction and models provide basic cardiac anatomy and physiology, with the coronary electrical and arterial systems. Review of Valvular disease and Heart Failure   Cardiac  Medications: - Group verbal and written instruction to review commonly prescribed medications for heart disease. Reviews the medication, class of the drug, and side effects.   Know Your Numbers and Risk Factors: -Group verbal and written instruction about important numbers in your health.  Discussion of what are risk factors and how they play a role in the disease process.  Review of Cholesterol, Blood Pressure, Diabetes, and BMI and the role they play in your overall health.   Sleep Hygiene: -Provides group verbal and written instruction about how sleep can affect your health.  Define sleep hygiene, discuss sleep cycles and impact of sleep habits. Review good sleep hygiene tips.    Pulmonary Rehab from 05/01/2018 in [facility] Cardiac and Pulmonary Rehab  Date  05/01/18  Educator  [facility]  Instruction Review Code  1- Verbalizes Understanding      Other: -Provides group and verbal instruction on various topics (see comments)    Knowledge Questionnaire Score: Knowledge Questionnaire Score - 04/01/18 1402      Knowledge Questionnaire Score   Pre Score  15/18   reviewed with patient       Core Components/Risk Factors/Patient Goals at Admission: Personal Goals and Risk Factors at Admission - 04/01/18 1404      Core Components/Risk Factors/Patient Goals on Admission    Weight Management  Yes;Weight Loss    Intervention  Weight Management: Develop a combined nutrition and  exercise program designed to reach desired caloric intake, while maintaining appropriate intake of nutrient and fiber, sodium and fats, and appropriate energy expenditure required for the weight goal.;Weight Management: Provide education and appropriate resources to help participant work on and attain dietary goals.;Weight Management/Obesity: Establish reasonable short term and long term weight goals.    Admit Weight  229 lb 8 oz (104.1 kg)    Goal Weight: Short Term  225 lb (102.1 kg)    Goal Weight: Long Term  215 lb (97.5 kg)    Expected Outcomes  Short Term: Continue to assess and modify interventions until short term weight is achieved;Long Term: Adherence to nutrition and physical activity/exercise program aimed toward attainment of established weight goal;Weight Loss: Understanding of general recommendations for a balanced deficit meal plan, which promotes 1-2 lb weight loss per week and includes a negative energy balance of [phone] kcal/d;Understanding recommendations for meals to include 15-35% energy as protein, 25-35% energy from fat, 35-60% energy from carbohydrates, less than 223m of dietary cholesterol, 20-35 gm of total fiber daily;Understanding of distribution of calorie intake throughout the day with the consumption of 4-5 meals/snacks    Improve shortness of breath with ADL's  Yes    Intervention  Provide education, individualized exercise plan and daily activity instruction to help decrease symptoms of SOB with activities of daily living.    Expected Outcomes  Short Term: Improve cardiorespiratory fitness to achieve a reduction of symptoms when performing ADLs;Long Term: Be able to perform more ADLs without symptoms or delay the onset of symptoms    Diabetes  Yes    Intervention  Provide education about signs/symptoms and action to take for hypo/hyperglycemia.;Provide education about proper nutrition, including hydration, and aerobic/resistive exercise prescription along with prescribed  medications to achieve blood glucose in normal ranges: Fasting glucose 65-99 mg/dL    Expected Outcomes  Short Term: Participant verbalizes understanding of the signs/symptoms and immediate care of hyper/hypoglycemia, proper foot care and importance of medication, aerobic/resistive exercise and nutrition plan for blood glucose control.;Long Term: Attainment of HbA1C < 7%.  Heart Failure  Yes    Intervention  Provide a combined exercise and nutrition program that is supplemented with education, support and counseling about heart failure. Directed toward relieving symptoms such as shortness of breath, decreased exercise tolerance, and extremity edema.    Expected Outcomes  Improve functional capacity of life;Short term: Attendance in program 2-3 days a week with increased exercise capacity. Reported lower sodium intake. Reported increased fruit and vegetable intake. Reports medication compliance.;Short term: Daily weights obtained and reported for increase. Utilizing diuretic protocols set by physician.;Long term: Adoption of self-care skills and reduction of barriers for early signs and symptoms recognition and intervention leading to self-care maintenance.    Hypertension  Yes    Intervention  Provide education on lifestyle modifcations including regular physical activity/exercise, weight management, moderate sodium restriction and increased consumption of fresh fruit, vegetables, and low fat dairy, alcohol moderation, and smoking cessation.;Monitor prescription use compliance.    Expected Outcomes  Short Term: Continued assessment and intervention until BP is < 140/75m HG in hypertensive participants. < 130/812mHG in hypertensive participants with diabetes, heart failure or chronic kidney disease.;Long Term: Maintenance of blood pressure at goal levels.    Lipids  Yes    Intervention  Provide education and support for participant on nutrition & aerobic/resistive exercise along with prescribed  medications to achieve LDL <7041mHDL >90m86m  Expected Outcomes  Short Term: Participant states understanding of desired cholesterol values and is compliant with medications prescribed. Participant is following exercise prescription and nutrition guidelines.;Long Term: Cholesterol controlled with medications as prescribed, with individualized exercise RX and with personalized nutrition plan. Value goals: LDL < 70mg75mL > 40 mg.       Core Components/Risk Factors/Patient Goals Review:  Goals and Risk Factor Review    Row Name 04/15/18 1458             Core Components/Risk Factors/Patient Goals Review   Personal Goals Review  Lipids;Heart Failure;Improve shortness of breath with ADL's;Weight Management/Obesity;Diabetes       Review  LarryJastines that his blood flow is not good to his right leg. Sometimes he has to take a break on the treadmill because it get painful. He is checking his sugar at home and here in the program. When he feels like he is getting low sometimes he likes to be checked. He brings apple juice incase his sugar is low after class. We will check his machine against ours if he would like to use his.       Expected Outcomes  Short: Attend [facility] regularly to improve shortness of breath with ADL's. Long: maintain independence with ADL's           Core Components/Risk Factors/Patient Goals at Discharge (Final Review):  Goals and Risk Factor Review - 04/15/18 1458      Core Components/Risk Factors/Patient Goals Review   Personal Goals Review  Lipids;Heart Failure;Improve shortness of breath with ADL's;Weight Management/Obesity;Diabetes    Review  LarryDutches that his blood flow is not good to his right leg. Sometimes he has to take a break on the treadmill because it get painful. He is checking his sugar at home and here in the program. When he feels like he is getting low sometimes he likes to be checked. He brings apple juice incase his sugar is low after class. We will  check his machine against ours if he would like to use his.    Expected Outcomes  Short: Attend [facility] regularly to improve  shortness of breath with ADL's. Long: maintain independence with ADL's        ITP Comments: ITP Comments    Row Name 04/01/18 1345 04/08/18 0836 05/06/18 0829       ITP Comments  Medical Evaluation completed. Chart sent for review and changes to Dr. Emily Filbert Director of [place]. Diagnosis can be found in [facility] encounter 03/21/2018  30 day review completed. ITP sent to Dr. Emily Filbert Director of [place]. Continue with ITP unless changes are made by physician.  30 day review completed. ITP sent to Dr. Emily Filbert Director of [place]. Continue with ITP unless changes are made by physician.        Comments: 30 day review

## 2018-05-08 DIAGNOSIS — J449 Chronic obstructive pulmonary disease, unspecified: Secondary | ICD-10-CM

## 2018-05-08 NOTE — Progress Notes (Signed)
Daily Session Note  Patient Details  Name: Ernest Mclean MRN: [id number] Date of Birth: 04-04-1941 Referring Provider:     Pulmonary Rehab from 04/01/2018 in [facility] Cardiac and Pulmonary Rehab  Referring Provider  Kasa      Encounter Date: 05/08/2018  Check In: Session Check In - 05/08/18 0957      Check-In   Supervising physician immediately available to respond to emergencies  [facility] immediately available ER MD    Physician(s)  Drs. Alyse Low    Location  [facility]-Cardiac & Pulmonary Rehab    Staff Present  Alberteen Sam, MA, RCEP, CCRP, Exercise Physiologist;Jeanna Durrell BS, Exercise Physiologist;Joseph Tessie Fass RCP,RRT,BSRT    Medication changes reported      No    Fall or balance concerns reported     No    Warm-up and Cool-down  Performed as group-led instruction    Resistance Training Performed  Yes    VAD Patient?  No    PAD/SET Patient?  No          Social History   Tobacco Use  Smoking Status Former Smoker  . Packs/day: 1.00  . Years: 30.00  . Pack years: 30.00  . Types: Cigarettes  . Last attempt to quit: 12/14/2001  . Years since quitting: 16.4  Smokeless Tobacco Never Used  Tobacco Comment   quit 12/14/2001    Goals Met:  Proper associated with RPD/PD & O2 Sat Independence with exercise equipment Using PLB without cueing & demonstrates good technique Exercise tolerated well No report of cardiac concerns or symptoms Strength training completed today  Goals Unmet:  Not Applicable  Comments: Pt able to follow exercise prescription today without complaint.  Will continue to monitor for progression.    Dr. Emily Filbert is Medical Director for [place] and [facility].

## 2018-05-10 ENCOUNTER — Encounter: Payer: Medicare Other | Admitting: *Deleted

## 2018-05-10 DIAGNOSIS — J449 Chronic obstructive pulmonary disease, unspecified: Secondary | ICD-10-CM

## 2018-05-10 NOTE — Progress Notes (Signed)
Daily Session Note  Patient Details  Name: Ernest Mclean MRN: [id number] Date of Birth: 1940-12-31 Referring Provider:     Pulmonary Rehab from 04/01/2018 in [facility] Cardiac and Pulmonary Rehab  Referring Provider  Kasa      Encounter Date: 05/10/2018  Check In: Session Check In - 05/10/18 1017      Check-In   Supervising physician immediately available to respond to emergencies  [facility] immediately available ER MD    Physician(s)  Dr. Corky Downs and [facility]    Location  [facility]-Cardiac & Pulmonary Rehab    Staff Present  Renita Papa, RN BSN;Joseph Hood RCP,RRT,BSRT;Amanda Oletta Darter, [place], ACSM CEP, Exercise Physiologist    Medication changes reported      No    Fall or balance concerns reported     No    Warm-up and Cool-down  Performed as group-led instruction    Resistance Training Performed  Yes    VAD Patient?  No    PAD/SET Patient?  No      Pain Assessment   Currently in Pain?  No/denies          Social History   Tobacco Use  Smoking Status Former Smoker  . Packs/day: 1.00  . Years: 30.00  . Pack years: 30.00  . Types: Cigarettes  . Last attempt to quit: 12/14/2001  . Years since quitting: 16.4  Smokeless Tobacco Never Used  Tobacco Comment   quit 12/14/2001    Goals Met:  Proper associated with RPD/PD & O2 Sat Independence with exercise equipment Using PLB without cueing & demonstrates good technique Exercise tolerated well No report of cardiac concerns or symptoms Strength training completed today  Goals Unmet:  Not Applicable  Comments: Pt able to follow exercise prescription today without complaint.  Will continue to monitor for progression.    Dr. Emily Filbert is Medical Director for [place] and [facility].

## 2018-05-13 ENCOUNTER — Encounter: Payer: Medicare Other | Admitting: *Deleted

## 2018-05-13 DIAGNOSIS — J449 Chronic obstructive pulmonary disease, unspecified: Secondary | ICD-10-CM | POA: Diagnosis not present

## 2018-05-13 NOTE — Progress Notes (Signed)
Daily Session Note  Patient Details  Name: Ernest Mclean MRN: [id number] Date of Birth: 30-Jul-1940 Referring Provider:     Pulmonary Rehab from 04/01/2018 in [facility] Cardiac and Pulmonary Rehab  Referring Provider  Kasa      Encounter Date: 05/13/2018  Check In: Session Check In - 05/13/18 1015      Check-In   Supervising physician immediately available to respond to emergencies  [facility] immediately available ER MD    Physician(s)  Dr. Clearnce Hasten and Dr. Joni Fears    Location  [facility]-Cardiac & Pulmonary Rehab    Staff Present  Earlean Shawl, BS, ACSM CEP, Exercise Physiologist;Amanda Oletta Darter, BA, ACSM CEP, Exercise Physiologist;Jeanna Durrell BS, Exercise Physiologist    Medication changes reported      No    Fall or balance concerns reported     No    Tobacco Cessation  No Change    Warm-up and Cool-down  Performed as group-led instruction    Resistance Training Performed  Yes    VAD Patient?  No    PAD/SET Patient?  No      Pain Assessment   Currently in Pain?  No/denies    Multiple Pain Sites  No          Social History   Tobacco Use  Smoking Status Former Smoker  . Packs/day: 1.00  . Years: 30.00  . Pack years: 30.00  . Types: Cigarettes  . Last attempt to quit: 12/14/2001  . Years since quitting: 16.4  Smokeless Tobacco Never Used  Tobacco Comment   quit 12/14/2001    Goals Met:  Proper associated with RPD/PD & O2 Sat Independence with exercise equipment Exercise tolerated well No report of cardiac concerns or symptoms Strength training completed today  Goals Unmet:  Not Applicable  Comments: Pt able to follow exercise prescription today without complaint.  Will continue to monitor for progression.    Dr. Emily Filbert is Medical Director for [place] and [facility].

## 2018-05-17 ENCOUNTER — Encounter: Payer: Medicare Other | Admitting: *Deleted

## 2018-05-17 DIAGNOSIS — J449 Chronic obstructive pulmonary disease, unspecified: Secondary | ICD-10-CM

## 2018-05-17 NOTE — Progress Notes (Signed)
Daily Session Note  Patient Details  Name: Ernest Mclean MRN: [id number] Date of Birth: [birth year] Referring Provider:     Pulmonary Rehab from 04/01/2018 in [facility] Cardiac and Pulmonary Rehab  Referring Provider  Kasa      Encounter Date: 05/17/2018  Check In: Session Check In - 05/17/18 1012      Check-In   Supervising physician immediately available to respond to emergencies  [facility] immediately available ER MD    Physician(s)  Drs. Paduchowski and [facility] Re[facility]    Staff Present  Carson Myrtle, BS, RRT, Respiratory Dareen Piano, BA, ACSM CEP, Exercise Physiologist;Meredith Sherryll Burger, RN BSN    Medication changes reported      No    Fall or balance concerns reported     No    Warm-up and Cool-down  Performed as group-led instruction    Resistance Training Performed  Yes    VAD Patient?  No    PAD/SET Patient?  No      Pain Assessment   Currently in Pain?  No/denies          Social History   Tobacco Use  Smoking Status Former Smoker  . Packs/day: 1.00  . Years: 30.00  . Pack years: 30.00  . Types: Cigarettes  . Last attempt to quit: 12/14/2001  . Years since quitting: 16.4  Smokeless Tobacco Never Used  Tobacco Comment   quit 12/14/2001    Goals Met:  Proper associated with RPD/PD & O2 Sat Independence with exercise equipment Using PLB without cueing & demonstrates good technique Exercise tolerated well No report of cardiac concerns or symptoms Strength training completed today  Goals Unmet:  Not Applicable  Comments: Pt able to follow exercise prescription today without complaint.  Will continue to monitor for progression.    Dr. Emily Filbert is Medical Director for [place] and [facility].

## 2018-05-20 DIAGNOSIS — J449 Chronic obstructive pulmonary disease, unspecified: Secondary | ICD-10-CM | POA: Diagnosis not present

## 2018-05-20 NOTE — Progress Notes (Signed)
Daily Session Note  Patient Details  Name: Ernest Mclean MRN: [id number] Date of Birth: Apr 01, 1941 Referring Provider:     Pulmonary Rehab from 04/01/2018 in [facility] Cardiac and Pulmonary Rehab  Referring Provider  Kasa      Encounter Date: 05/20/2018  Check In: Session Check In - 05/20/18 1013      Check-In   Supervising physician immediately available to respond to emergencies  [facility] immediately available ER MD    Physician(s)  Dr. Cinda Quest and Quentin Cornwall    Location  [facility]-Cardiac & Pulmonary Rehab    Staff Present  Nada Maclachlan, BA, ACSM CEP, Exercise Physiologist;Jeanna Durrell BS, Exercise Physiologist;Twinkle Sockwell Tessie Fass RCP,RRT,BSRT    Medication changes reported      No    Fall or balance concerns reported     No    Warm-up and Cool-down  Performed as group-led instruction    Resistance Training Performed  Yes    VAD Patient?  No    PAD/SET Patient?  No      Pain Assessment   Currently in Pain?  No/denies          Social History   Tobacco Use  Smoking Status Former Smoker  . Packs/day: 1.00  . Years: 30.00  . Pack years: 30.00  . Types: Cigarettes  . Last attempt to quit: 12/14/2001  . Years since quitting: 16.4  Smokeless Tobacco Never Used  Tobacco Comment   quit 12/14/2001    Goals Met:  Independence with exercise equipment Exercise tolerated well No report of cardiac concerns or symptoms Strength training completed today  Goals Unmet:  Not Applicable  Comments: Pt able to follow exercise prescription today without complaint.  Will continue to monitor for progression.    Dr. Emily Filbert is Medical Director for Ernest Mclean and [facility].

## 2018-05-24 ENCOUNTER — Encounter: Payer: Medicare Other | Attending: Internal Medicine

## 2018-05-24 DIAGNOSIS — J449 Chronic obstructive pulmonary disease, unspecified: Secondary | ICD-10-CM | POA: Diagnosis present

## 2018-05-24 NOTE — Progress Notes (Signed)
Daily Session Note  Patient Details  Name: Ernest Mclean MRN: [id number] Date of Birth: 1941/02/03 Referring Provider:     Pulmonary Rehab from 04/01/2018 in [facility] Cardiac and Pulmonary Rehab  Referring Provider  Kasa      Encounter Date: 05/24/2018  Check In: Session Check In - 05/24/18 0959      Check-In   Supervising physician immediately available to respond to emergencies  [facility] immediately available ER MD    Physician(s)  Dr. Cinda Quest and Joni Fears    Location  [facility]-Cardiac & Pulmonary Rehab    Staff Present  Justin Mend RCP,RRT,BSRT;Amanda Oletta Darter, [place], ACSM CEP, Exercise Physiologist;Meredith Sherryll Burger, RN BSN    Medication changes reported      No    Fall or balance concerns reported     No    Warm-up and Cool-down  Performed as group-led Higher education careers adviser Performed  Yes    VAD Patient?  No    PAD/SET Patient?  No      Pain Assessment   Currently in Pain?  No/denies          Social History   Tobacco Use  Smoking Status Former Smoker  . Packs/day: 1.00  . Years: 30.00  . Pack years: 30.00  . Types: Cigarettes  . Last attempt to quit: 12/14/2001  . Years since quitting: 16.4  Smokeless Tobacco Never Used  Tobacco Comment   quit 12/14/2001    Goals Met:  Independence with exercise equipment Exercise tolerated well No report of cardiac concerns or symptoms Strength training completed today  Goals Unmet:  Not Applicable  Comments: Pt able to follow exercise prescription today without complaint.  Will continue to monitor for progression.    Dr. Emily Filbert is Medical Director for [place] and [facility].

## 2018-05-27 ENCOUNTER — Encounter: Payer: Medicare Other | Admitting: *Deleted

## 2018-05-27 DIAGNOSIS — J449 Chronic obstructive pulmonary disease, unspecified: Secondary | ICD-10-CM

## 2018-05-27 NOTE — Progress Notes (Signed)
Daily Session Note  Patient Details  Name: Ernest Mclean MRN: [id number] Date of Birth: 1941/03/29 Referring Provider:     Pulmonary Rehab from 04/01/2018 in [facility] Cardiac and Pulmonary Rehab  Referring Provider  Kasa      Encounter Date: 05/27/2018  Check In: Session Check In - 05/27/18 1014      Check-In   Supervising physician immediately available to respond to emergencies  [facility] immediately available ER MD    Physician(s)  Dr. Joni Fears and Dr. Kerman Passey    Location  [facility]-Cardiac & Pulmonary Rehab    Staff Present  Earlean Shawl, BS, ACSM CEP, Exercise Physiologist;Joseph [facility], [place], ACSM CEP, Exercise Physiologist    Medication changes reported      No    Fall or balance concerns reported     No    Tobacco Cessation  No Change    Warm-up and Cool-down  Performed as group-led instruction    Resistance Training Performed  Yes    VAD Patient?  No    PAD/SET Patient?  No      Pain Assessment   Currently in Pain?  No/denies    Multiple Pain Sites  No          Social History   Tobacco Use  Smoking Status Former Smoker  . Packs/day: 1.00  . Years: 30.00  . Pack years: 30.00  . Types: Cigarettes  . Last attempt to quit: 12/14/2001  . Years since quitting: 16.4  Smokeless Tobacco Never Used  Tobacco Comment   quit 12/14/2001    Goals Met:  Proper associated with RPD/PD & O2 Sat Independence with exercise equipment Exercise tolerated well No report of cardiac concerns or symptoms Strength training completed today  Goals Unmet:  Not Applicable  Comments: Pt able to follow exercise prescription today without complaint.  Will continue to monitor for progression.    Dr. Emily Filbert is Medical Director for [place] and [facility].

## 2018-05-29 DIAGNOSIS — J449 Chronic obstructive pulmonary disease, unspecified: Secondary | ICD-10-CM

## 2018-05-29 NOTE — Progress Notes (Signed)
Daily Session Note  Patient Details  Name: Ernest Mclean MRN: [id number] Date of Birth: Nov 15, 1940 Referring Provider:     Pulmonary Rehab from 04/01/2018 in [facility] Cardiac and Pulmonary Rehab  Referring Provider  Kasa      Encounter Date: 05/29/2018  Check In: Session Check In - 05/29/18 1019      Check-In   Supervising physician immediately available to respond to emergencies  [facility] immediately available ER MD    Physician(s)  Jimmye Norman and Mariea Clonts    Location  [facility]-Cardiac & Pulmonary Rehab    Staff Present  Alberteen Sam, MA, RCEP, CCRP, Exercise Physiologist;Joseph Foy [place], [place], ACSM CEP, Exercise Physiologist    Medication changes reported      No    Fall or balance concerns reported     No    Warm-up and Cool-down  Performed as group-led instruction    Resistance Training Performed  Yes    VAD Patient?  No    PAD/SET Patient?  No      Pain Assessment   Currently in Pain?  No/denies    Multiple Pain Sites  No          Social History   Tobacco Use  Smoking Status Former Smoker  . Packs/day: 1.00  . Years: 30.00  . Pack years: 30.00  . Types: Cigarettes  . Last attempt to quit: 12/14/2001  . Years since quitting: 16.4  Smokeless Tobacco Never Used  Tobacco Comment   quit 12/14/2001    Goals Met:  Proper associated with RPD/PD & O2 Sat Independence with exercise equipment Exercise tolerated well Strength training completed today  Goals Unmet:  Not Applicable  Comments: Pt able to follow exercise prescription today without complaint.  Will continue to monitor for progression.    Dr. Emily Filbert is Medical Director for [place] and [facility].

## 2018-05-31 DIAGNOSIS — J449 Chronic obstructive pulmonary disease, unspecified: Secondary | ICD-10-CM

## 2018-05-31 NOTE — Progress Notes (Signed)
Daily Session Note  Patient Details  Name: Ernest Mclean MRN: [id number] Date of Birth: [birth year] Referring Provider:     Pulmonary Rehab from 04/01/2018 in [facility] Cardiac and Pulmonary Rehab  Referring Provider  Kasa      Encounter Date: 05/31/2018  Check In: Session Check In - 05/31/18 1010      Check-In   Supervising physician immediately available to respond to emergencies  [facility] immediately available ER MD    Physician(s)  Dr.Robinson and Alfred Levins    Location  [facility]-Cardiac & Pulmonary Rehab    Staff Present  Justin Mend RCP,RRT,BSRT;Amanda Oletta Darter, [place], ACSM CEP, Exercise Physiologist    Medication changes reported      No    Fall or balance concerns reported     No    Warm-up and Cool-down  Performed as group-led instruction    Resistance Training Performed  Yes    VAD Patient?  No    PAD/SET Patient?  No      Pain Assessment   Currently in Pain?  No/denies          Social History   Tobacco Use  Smoking Status Former Smoker  . Packs/day: 1.00  . Years: 30.00  . Pack years: 30.00  . Types: Cigarettes  . Last attempt to quit: 12/14/2001  . Years since quitting: 16.4  Smokeless Tobacco Never Used  Tobacco Comment   quit 12/14/2001    Goals Met:  Independence with exercise equipment Exercise tolerated well No report of cardiac concerns or symptoms Strength training completed today  Goals Unmet:  Not Applicable  Comments: Pt able to follow exercise prescription today without complaint.  Will continue to monitor for progression.    Dr. Emily Filbert is Medical Director for [place] and [facility].

## 2018-06-03 ENCOUNTER — Encounter: Payer: Medicare Other | Admitting: *Deleted

## 2018-06-03 DIAGNOSIS — J449 Chronic obstructive pulmonary disease, unspecified: Secondary | ICD-10-CM | POA: Diagnosis not present

## 2018-06-03 NOTE — Progress Notes (Signed)
Daily Session Note  Patient Details  Name: Ernest Mclean MRN: [id number] Date of Birth: 1940-09-08 Referring Provider:     Pulmonary Rehab from 04/01/2018 in [facility] Cardiac and Pulmonary Rehab  Referring Provider  Kasa      Encounter Date: 06/03/2018  Check In: Session Check In - 06/03/18 1024      Check-In   Supervising physician immediately available to respond to emergencies  [facility] immediately available ER MD    Physician(s)  Dr. Kerman Passey Dr. Cinda Quest    Location  [facility]-Cardiac & Pulmonary Rehab    Staff Present  Earlean Shawl, BS, ACSM CEP, Exercise Physiologist;Joseph [facility], [place], ACSM CEP, Exercise Physiologist    Medication changes reported      No    Fall or balance concerns reported     No    Tobacco Cessation  No Change    Warm-up and Cool-down  Performed as group-led instruction    Resistance Training Performed  Yes    VAD Patient?  No    PAD/SET Patient?  No      Pain Assessment   Currently in Pain?  No/denies    Multiple Pain Sites  No          Social History   Tobacco Use  Smoking Status Former Smoker  . Packs/day: 1.00  . Years: 30.00  . Pack years: 30.00  . Types: Cigarettes  . Last attempt to quit: 12/14/2001  . Years since quitting: 16.4  Smokeless Tobacco Never Used  Tobacco Comment   quit 12/14/2001    Goals Met:  Proper associated with RPD/PD & O2 Sat Independence with exercise equipment Exercise tolerated well No report of cardiac concerns or symptoms Strength training completed today  Goals Unmet:  Not Applicable  Comments: Pt able to follow exercise prescription today without complaint.  Will continue to monitor for progression.    Dr. Emily Filbert is Medical Director for [place] and [facility].

## 2018-06-03 NOTE — Progress Notes (Signed)
Pulmonary Individual Treatment Plan  Patient Details  Name: Ernest Mclean MRN: [id number] Date of Birth: 11-19-1940 Referring Provider:     Pulmonary Rehab from 04/01/2018 in [facility] Cardiac and Pulmonary Rehab  Referring Provider  Kasa      Initial Encounter Date:    Pulmonary Rehab from 04/01/2018 in [facility] Cardiac and Pulmonary Rehab  Date  04/01/18      Visit Diagnosis: Chronic obstructive pulmonary disease, unspecified COPD type ([place])  Patient's Home Medications on Admission:  Current Outpatient Medications:  .  albuterol (PROVENTIL HFA;VENTOLIN HFA) 108 (90 Base) MCG/ACT inhaler, Inhale 2 puffs into the lungs every 4 (four) hours as needed for wheezing or shortness of breath., Disp: 25.5 Inhaler, Rfl: 1 .  amLODipine (NORVASC) 5 MG tablet, Take 5 mg by mouth daily. , Disp: , Rfl:  .  ammonium lactate (AMLACTIN) 12 % cream, Apply 1 g topically as needed for dry skin., Disp: , Rfl:  .  apixaban (ELIQUIS) 5 MG TABS tablet, Take 5 mg by mouth 2 (two) times daily., Disp: , Rfl:  .  aspirin EC 81 MG tablet, Take 81 mg by mouth daily., Disp: , Rfl:  .  citalopram (CELEXA) 20 MG tablet, Take 20 mg by mouth at bedtime. , Disp: , Rfl: 5 .  CRANBERRY PO, Take 820 mg by mouth daily., Disp: , Rfl:  .  cyanocobalamin 500 MCG tablet, Take 500 mcg by mouth daily., Disp: , Rfl:  .  desonide (DESOWEN) 0.05 % cream, Apply topically., Disp: , Rfl:  .  diazepam (VALIUM) 5 MG tablet, Take 5 mg by mouth every 12 (twelve) hours as needed for anxiety. , Disp: , Rfl:  .  ergocalciferol (VITAMIN D2) 50000 units capsule, Take 50,000 Units by mouth every 30 (thirty) days., Disp: , Rfl:  .  furosemide (LASIX) 20 MG tablet, Take 20 mg by mouth daily. Take one tablet daily.  Can take extra tablet in afternoon if increase weight gain of 3 pounds or increased swelling., Disp: , Rfl:  .  insulin glargine (LANTUS) 100 UNIT/ML injection, Inject 70 Units into the skin at bedtime., Disp: , Rfl:  .  insulin lispro  (HUMALOG) 100 UNIT/ML injection, Inject 15-25 Units into the skin 3 (three) times daily before meals. 15 units before breakfast, 25 units before lunch, and 25 units before supper plus additional units for sliding scale, Disp: , Rfl:  .  ketoconazole (NIZORAL) 2 % shampoo, Apply 1 application topically 2 (two) times a week., Disp: , Rfl:  .  lisinopril (PRINIVIL,ZESTRIL) 5 MG tablet, Take 5 mg by mouth daily., Disp: , Rfl:  .  metFORMIN (GLUCOPHAGE) 500 MG tablet, Take 1,000 mg by mouth every evening. , Disp: , Rfl:  .  metoprolol (LOPRESSOR) 100 MG tablet, Take 100 mg by mouth 2 (two) times daily., Disp: , Rfl: 0 .  OMEGA-3 FATTY ACIDS-VITAMIN E PO, Take 1 capsule by mouth daily., Disp: , Rfl:  .  pravastatin (PRAVACHOL) 20 MG tablet, Take 20 mg by mouth at bedtime., Disp: , Rfl: 3 .  tiotropium (SPIRIVA) 18 MCG inhalation capsule, Place 18 mcg into inhaler and inhale daily., Disp: , Rfl:  .  vitamin C (ASCORBIC ACID) 500 MG tablet, Take 500 mg by mouth daily., Disp: , Rfl:  .  WIXELA INHUB 500-50 MCG/DOSE AEPB, TAKE 1 PUFF BY MOUTH TWICE A DAY, Disp: 180 each, Rfl: 1  Past Medical History: Past Medical History:  Diagnosis Date  . Atrial fibrillation ([place])   . Basal  cell carcinoma 06/2017   Left Ear  . CAD (coronary artery disease)   . CKD (chronic kidney disease)   . Congestive heart failure ([place])   . COPD (chronic obstructive pulmonary disease) ([place])   . Diabetes ([place])   . Hypertension   . OSA on CPAP   . 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 Packs/day: 1.00  . Years: 30.00  . Pack years: 30.00  . Types: Cigarettes  . Last attempt to quit: 12/14/2001  . Years since quitting: 16.4  Smokeless Tobacco Never Used  Tobacco Comment   quit 12/14/2001    Labs: Recent Review Flowsheet Data    Labs for ITP Cardiac and Pulmonary Rehab Latest Ref Rng & Units 06/20/2014 06/23/2014 05/13/2015 08/01/2016    Cholestrol 0 - 200 mg/dL 160 - - -   LDLCALC 0 - 100 mg/dL 70 - - -   HDL 40 - 60 mg/dL 28(L) - - -   Trlycerides 0 - 200 mg/dL 311(H) - - -   Hemoglobin A1c 4.8 - 5.6 % - 10.8(H) 7.9(H) 8.4(H)   HCO3 20.0 - 28.0 mmol/L - - - 32.4(H)       Pulmonary Assessment Scores: Pulmonary Assessment Scores    Row Name 04/01/18 1412         ADL UCSD   ADL Phase  Entry     SOB Score total  3     Rest  0     Walk  0     Stairs  0     Bath  0     Dress  0     Shop  0       CAT Score   CAT Score  4       mMRC Score   mMRC Score  1        Pulmonary Function Assessment: Pulmonary Function Assessment - 04/01/18 1413      Initial Spirometry Results   FVC%  64 %    FEV1%  68 %    FEV1/FVC Ratio  77    Comments  test performed on 02/10/2015      Post Bronchodilator Spirometry Results   FVC%  70 %    FEV1%  75 %    FEV1/FVC Ratio  78    Comments  test performed on 02/10/2015      Breath   Bilateral Breath Sounds  Clear    Shortness of Breath  Yes;Limiting activity       Exercise Target Goals: Exercise Program Goal: Individual exercise prescription set using results from initial 6 min walk test and THRR while considering  patient's activity barriers and safety.   Exercise Prescription Goal: Initial exercise prescription builds to 30-45 minutes a day of aerobic activity, 2-3 days per week.  Home exercise guidelines will be given to patient during program as part of exercise prescription that the participant will acknowledge.  Activity Barriers & Risk Stratification:   6 Minute Walk: 6 Minute Walk    Row Name 04/01/18 1606         6 Minute Walk   Phase  Initial     Distance  733 feet     Walk Time  6 minutes     # of Rest Breaks  0     MPH  1.39     METS  1.45  RPE  13     Perceived Dyspnea   0     VO2 Peak  5.06     Symptoms  Yes (comment)     Comments  R calf pain     Resting HR  61 bpm     Resting BP  148/64     Resting Oxygen Saturation   95 %      Exercise Oxygen Saturation  during 6 min walk  92 %     Max Ex. HR  76 bpm     Max Ex. BP  156/60     2 Minute Post BP  142/64       Interval HR   1 Minute HR  59     2 Minute HR  58     3 Minute HR  61     5 Minute HR  65     6 Minute HR  76     2 Minute Post HR  59     Interval Heart Rate?  Yes       Interval Oxygen   Interval Oxygen?  Yes     Baseline Oxygen Saturation %  95 %     1 Minute Oxygen Saturation %  93 %     1 Minute Liters of Oxygen  0 L     2 Minute Oxygen Saturation %  94 %     2 Minute Liters of Oxygen  0 L     3 Minute Oxygen Saturation %  93 %     3 Minute Liters of Oxygen  0 L     4 Minute Liters of Oxygen  0 L     5 Minute Oxygen Saturation %  92 %     5 Minute Liters of Oxygen  0 L     6 Minute Oxygen Saturation %  94 %     6 Minute Liters of Oxygen  0 L     2 Minute Post Oxygen Saturation %  96 %     2 Minute Post Liters of Oxygen  0 L       Oxygen Initial Assessment: Oxygen Initial Assessment - 04/01/18 1356      Home Oxygen   Home Oxygen Device  Home Concentrator;E-Tanks    Sleep Oxygen Prescription  Continuous    Liters per minute  2    Home Exercise Oxygen Prescription  None    Home at Rest Exercise Oxygen Prescription  None    Compliance with Home Oxygen Use  Yes      Initial 6 min Walk   Oxygen Used  None      Program Oxygen Prescription   Program Oxygen Prescription  None      Intervention   Short Term Goals  To learn and demonstrate proper use of respiratory medications;To learn and demonstrate proper pursed lip breathing techniques or other breathing techniques.;To learn and understand importance of maintaining oxygen saturations>88%;To learn and understand importance of monitoring SPO2 with pulse oximeter and demonstrate accurate use of the pulse oximeter.;To learn and exhibit compliance with exercise, home and travel O2 prescription    Long  Term Goals  Exhibits compliance with exercise, home and travel O2 prescription;Verbalizes  importance of monitoring SPO2 with pulse oximeter and return demonstration;Maintenance of O2 saturations>88%;Exhibits proper breathing techniques, such as pursed lip breathing or other method taught during program session;Compliance with respiratory medication;Demonstrates proper use of MDI's       Oxygen Re-Evaluation:  Oxygen Re-Evaluation    Row Name 04/05/18 1016 04/15/18 1452 05/24/18 1040         Program Oxygen Prescription   Program Oxygen Prescription  None  None  None       Home Oxygen   Home Oxygen Device  Home Concentrator;E-Tanks  Home Concentrator;E-Tanks  Home Concentrator;E-Tanks     Sleep Oxygen Prescription  Continuous  Continuous  Continuous     Liters per minute  _0 Home Exercise Oxygen Prescription  None  None  None     Home at Rest Exercise Oxygen Prescription  None  None  None     Compliance with Home Oxygen Use  Yes  Yes  Yes       Goals/Expected Outcomes   Short Term Goals  To learn and demonstrate proper use of respiratory medications;To learn and demonstrate proper pursed lip breathing techniques or other breathing techniques.;To learn and understand importance of maintaining oxygen saturations>88%;To learn and understand importance of monitoring SPO2 with pulse oximeter and demonstrate accurate use of the pulse oximeter.;To learn and exhibit compliance with exercise, home and travel O2 prescription  To learn and demonstrate proper use of respiratory medications;To learn and demonstrate proper pursed lip breathing techniques or other breathing techniques.;To learn and understand importance of maintaining oxygen saturations>88%;To learn and understand importance of monitoring SPO2 with pulse oximeter and demonstrate accurate use of the pulse oximeter.;To learn and exhibit compliance with exercise, home and travel O2 prescription  To learn and demonstrate proper use of respiratory medications;To learn and demonstrate proper pursed lip breathing techniques or  other breathing techniques.;To learn and understand importance of maintaining oxygen saturations>88%;To learn and understand importance of monitoring SPO2 with pulse oximeter and demonstrate accurate use of the pulse oximeter.;To learn and exhibit compliance with exercise, home and travel O2 prescription     Long  Term Goals  Exhibits compliance with exercise, home and travel O2 prescription;Verbalizes importance of monitoring SPO2 with pulse oximeter and return demonstration;Maintenance of O2 saturations>88%;Exhibits proper breathing techniques, such as pursed lip breathing or other method taught during program session;Compliance with respiratory medication;Demonstrates proper use of MDI's  Exhibits compliance with exercise, home and travel O2 prescription;Verbalizes importance of monitoring SPO2 with pulse oximeter and return demonstration;Maintenance of O2 saturations>88%;Exhibits proper breathing techniques, such as pursed lip breathing or other method taught during program session;Compliance with respiratory medication;Demonstrates proper use of MDI's  Exhibits compliance with exercise, home and travel O2 prescription;Verbalizes importance of monitoring SPO2 with pulse oximeter and return demonstration;Maintenance of O2 saturations>88%;Exhibits proper breathing techniques, such as pursed lip breathing or other method taught during program session;Compliance with respiratory medication;Demonstrates proper use of MDI's     Comments  Reviewed PLB technique with pt.  Talked about how it work and it's important to maintaining his exercise saturations.    Jlynn checks his oxygen at home with his own pulse oximeter. He wears his CPAP with a 2 liter bleed in every night. He knows what his resting heart rate is and what his oxygen runs at home. Informed him to check his oxygen when he is exerting himself to make sure he is staying above 88 percent.  His breathing feels better since exercising. He has been between  93 and 97 percent oxygen when he is out shopping and exerting himself. He has no questions with his respiratory medications. He is going to try to increase his speeds and resistance to help with his shortness of breath.  Goals/Expected Outcomes  Short: Become more profiecient at using PLB.   Long: Become independent at using PLB.  Short: monitor oxygen at home with exertion. Long: maintain oxygen saturations above 88 percent independently.  Short: increase levels on machines. Long: maintain shortness of breath with increased exercise.        Oxygen Discharge (Final Oxygen Re-Evaluation): Oxygen Re-Evaluation - 05/24/18 1040      Program Oxygen Prescription   Program Oxygen Prescription  None      Home Oxygen   Home Oxygen Device  Home Concentrator;E-Tanks    Sleep Oxygen Prescription  Continuous    Liters per minute  2    Home Exercise Oxygen Prescription  None    Home at Rest Exercise Oxygen Prescription  None    Compliance with Home Oxygen Use  Yes      Goals/Expected Outcomes   Short Term Goals  To learn and demonstrate proper use of respiratory medications;To learn and demonstrate proper pursed lip breathing techniques or other breathing techniques.;To learn and understand importance of maintaining oxygen saturations>88%;To learn and understand importance of monitoring SPO2 with pulse oximeter and demonstrate accurate use of the pulse oximeter.;To learn and exhibit compliance with exercise, home and travel O2 prescription    Long  Term Goals  Exhibits compliance with exercise, home and travel O2 prescription;Verbalizes importance of monitoring SPO2 with pulse oximeter and return demonstration;Maintenance of O2 saturations>88%;Exhibits proper breathing techniques, such as pursed lip breathing or other method taught during program session;Compliance with respiratory medication;Demonstrates proper use of MDI's    Comments  His breathing feels better since exercising. He has been between  93 and 97 percent oxygen when he is out shopping and exerting himself. He has no questions with his respiratory medications. He is going to try to increase his speeds and resistance to help with his shortness of breath.    Goals/Expected Outcomes  Short: increase levels on machines. Long: maintain shortness of breath with increased exercise.       Initial Exercise Prescription: Initial Exercise Prescription - 04/01/18 1600      Date of Initial Exercise RX and Referring Provider   Date  04/01/18    Referring Provider  Kasa      Treadmill   MPH  1    Grade  0    Minutes  15   as tolerated - stop when needed to rest   METs  1.77      NuStep   Level  1    SPM  80    Minutes  15    METs  1.5      REL-XR   Level  1    Speed  50    Minutes  15    METs  1.5      Prescription Details   Frequency (times per week)  3    Duration  Progress to 45 minutes of aerobic exercise without signs/symptoms of physical distress      Intensity   THRR 40-80% of Max Heartrate  94-128    Ratings of Perceived Exertion  11-13    Perceived Dyspnea  0-4      Resistance Training   Training Prescription  Yes    Weight  3 lb    Reps  10-15       Perform Capillary Blood Glucose checks as needed.  Exercise Prescription Changes: Exercise Prescription Changes    Row Name 04/11/18 0800 04/25/18 0900 05/08/18 1200 05/21/18 1300  Response to Exercise   Blood Pressure (Admit)  130/60  128/60  122/58  132/60    Blood Pressure (Exercise)  142/80  -  -  -    Blood Pressure (Exit)  96/54  124/64  112/60  128/64    Heart Rate (Admit)  60 bpm  58 bpm  57 bpm  90 bpm    Heart Rate (Exercise)  60 bpm  120 bpm  70 bpm  86 bpm    Heart Rate (Exit)  63 bpm  62 bpm  64 bpm  63 bpm    Oxygen Saturation (Admit)  95 %  96 %  95 %  93 %    Oxygen Saturation (Exercise)  97 %  96 %  90 %  93 %    Oxygen Saturation (Exit)  96 %  98 %  97 %  96 %    Rating of Perceived Exertion (Exercise)  _0 Perceived Dyspnea (Exercise)  _1 Symptoms  none  none  none  none    Comments  day 2 exercise  -  -  -    Duration  Continue with 45 min of aerobic exercise without signs/symptoms of physical distress.  Continue with 45 min of aerobic exercise without signs/symptoms of physical distress.  Progress to 30 minutes of  aerobic without signs/symptoms of physical distress  Continue with 45 min of aerobic exercise without signs/symptoms of physical distress.    Intensity  Other (comment) not reaching THR but RPE 13  Other (comment) HR 120 not usual  THRR unchanged  THRR unchanged      Progression   Progression  Continue to progress workloads to maintain intensity without signs/symptoms of physical distress.  Continue to progress workloads to maintain intensity without signs/symptoms of physical distress.  Continue to progress workloads to maintain intensity without signs/symptoms of physical distress.  Continue to progress workloads to maintain intensity without signs/symptoms of physical distress.    Average METs  2.13  2  2.13  2      Resistance Training   Training Prescription  Yes  Yes  Yes  Yes    Weight  3 lb  3 lb  6  6 lb    Reps  10-15  10-15  10-15  10-15      Interval Training   Interval Training  -  -  No  No      Treadmill   MPH  _2 Grade  0  0  0  0    Minutes  _3 METs  1.77  1.77  1.77  1.77      NuStep   Level  _4 SPM  80  80  80  80    Minutes  _5 METs  3  2.4  2.1  2.4      REL-XR   Level  1  -  1.5  1.5    Speed  50  -  50  50    Minutes  15  -  15  15    METs  1.7  -  1.5  2       Exercise Comments: Exercise Comments  [place] Name 04/05/18 1016 04/24/18 1051         Exercise Comments  First full day of exercise!  Patient was oriented to gym and equipment including functions, settings, policies, and procedures.  Patient's individual exercise prescription and treatment plan were reviewed.  All starting  workloads were established based on the results of the 6 minute walk test done at initial orientation visit.  The plan for exercise progression was also introduced and progression will be customized based on patient's performance and goals.   Reviewed home exercise with pt today.  Pt plans to walk at home for exercise.  Reviewed THR, pulse, RPE, sign and symptoms, NTG use, and when to call 911 or MD.  Also discussed weather considerations and indoor options.  Pt voiced understanding.         Exercise Goals and Review: Exercise Goals    Row Name 04/01/18 1605             Exercise Goals   Increase Physical Activity  Yes       Intervention  Provide advice, education, support and counseling about physical activity/exercise needs.;Develop an individualized exercise prescription for aerobic and resistive training based on initial evaluation findings, risk stratification, comorbidities and participant's personal goals.       Expected Outcomes  Short Term: Attend rehab on a regular basis to increase amount of physical activity.;Long Term: Add in home exercise to make exercise part of routine and to increase amount of physical activity.;Long Term: Exercising regularly at least 3-5 days a week.       Increase Strength and Stamina  Yes       Intervention  Provide advice, education, support and counseling about physical activity/exercise needs.;Develop an individualized exercise prescription for aerobic and resistive training based on initial evaluation findings, risk stratification, comorbidities and participant's personal goals.       Expected Outcomes  Short Term: Increase workloads from initial exercise prescription for resistance, speed, and METs.;Short Term: Perform resistance training exercises routinely during rehab and add in resistance training at home;Long Term: Improve cardiorespiratory fitness, muscular endurance and strength as measured by increased METs and functional capacity (6MWT)       Able  to understand and use rate of perceived exertion (RPE) scale  Yes       Intervention  Provide education and explanation on how to use RPE scale       Expected Outcomes  Long Term:  Able to use RPE to guide intensity level when exercising independently;Short Term: Able to use RPE daily in rehab to express subjective intensity level       Able to understand and use Dyspnea scale  Yes       Intervention  Provide education and explanation on how to use Dyspnea scale       Expected Outcomes  Short Term: Able to use Dyspnea scale daily in rehab to express subjective sense of shortness of breath during exertion;Long Term: Able to use Dyspnea scale to guide intensity level when exercising independently       Knowledge and understanding of Target Heart Rate Range (THRR)  Yes       Intervention  Provide education and explanation of THRR including how the numbers were predicted and where they are located for reference       Expected Outcomes  Short Term: Able to state/look up THRR;Short Term: Able to use daily as guideline for intensity in rehab;Long Term: Able to use THRR to govern intensity when exercising  independently       Able to check pulse independently  Yes       Intervention  Provide education and demonstration on how to check pulse in carotid and radial arteries.;Review the importance of being able to check your own pulse for safety during independent exercise       Expected Outcomes  Short Term: Able to explain why pulse checking is important during independent exercise;Long Term: Able to check pulse independently and accurately       Understanding of Exercise Prescription  Yes       Intervention  Provide education, explanation, and written materials on patient's individual exercise prescription       Expected Outcomes  Short Term: Able to explain program exercise prescription;Long Term: Able to explain home exercise prescription to exercise independently          Exercise Goals Re-Evaluation  : Exercise Goals Re-Evaluation    Row Name 04/05/18 1016 04/11/18 0851 04/24/18 1051 04/25/18 0946 05/08/18 1231     Exercise Goal Re-Evaluation   Exercise Goals Review  Able to understand and use rate of perceived exertion (RPE) scale;Able to understand and use Dyspnea scale;Knowledge and understanding of Target Heart Rate Range (THRR);Understanding of Exercise Prescription  Increase Physical Activity;Increase Strength and Stamina;Able to understand and use rate of perceived exertion (RPE) scale;Able to understand and use Dyspnea scale  Increase Physical Activity;Able to understand and use rate of perceived exertion (RPE) scale;Knowledge and understanding of Target Heart Rate Range (THRR);Understanding of Exercise Prescription;Increase Strength and Stamina;Able to understand and use Dyspnea scale;Able to check pulse independently  Increase Physical Activity;Increase Strength and Stamina;Able to understand and use rate of perceived exertion (RPE) scale;Able to understand and use Dyspnea scale;Knowledge and understanding of Target Heart Rate Range (THRR);Able to check pulse independently;Understanding of Exercise Prescription  Increase Physical Activity;Able to understand and use rate of perceived exertion (RPE) scale;Knowledge and understanding of Target Heart Rate Range (THRR);Understanding of Exercise Prescription;Increase Strength and Stamina;Able to understand and use Dyspnea scale   Comments  Reviewed RPE scale, THR and program prescription with pt today.  Pt voiced understanding and was given a copy of goals to take home.   Bach has tolerated exercise well in first sessions.  Staff will monitor progress.   Reviewed home exercise with pt today.  Pt plans to walk at home for exercise.  Reviewed THR, pulse, RPE, sign and symptoms, NTG use, and when to call 911 or MD.  Also discussed weather considerations and indoor options.  Pt voiced understanding.  Jeven increased levels on NS and plasn to increase  weight strength training next session.    Quandarius increased resistance training weight up to 6 lbs. Continue to monitor progression.   Expected Outcomes  Short: Use RPE daily to regulate intensity. Long: Follow program prescription in THR.  Short - attend consistently Long - increase overall MET level  Short - increase walking time at home from 15-30 min Long - maintain exercise on his own  Short - increase to 6 lb weights for strength Long - increase MET level  Short- increase level on NS. Long- Increase MET level and make exercise a permanent commitment.   [place] Name 05/21/18 1324             Exercise Goal Re-Evaluation   Exercise Goals Review  Increase Physical Activity;Increase Strength and Stamina;Able to understand and use rate of perceived exertion (RPE) scale;Able to understand and use Dyspnea scale;Knowledge and understanding of Target Heart Rate Range (  THRR);Understanding of Exercise Prescription       Comments  Blake tolerates exercise well and works in correct RPE range.  Staff will monitor progress.         Expected Outcomes  Short - increase level on TM Long - increase overall MET level          Discharge Exercise Prescription (Final Exercise Prescription Changes): Exercise Prescription Changes - 05/21/18 1300      Response to Exercise   Blood Pressure (Admit)  132/60    Blood Pressure (Exit)  128/64    Heart Rate (Admit)  90 bpm    Heart Rate (Exercise)  86 bpm    Heart Rate (Exit)  63 bpm    Oxygen Saturation (Admit)  93 %    Oxygen Saturation (Exercise)  93 %    Oxygen Saturation (Exit)  96 %    Rating of Perceived Exertion (Exercise)  13    Perceived Dyspnea (Exercise)  2    Symptoms  none    Duration  Continue with 45 min of aerobic exercise without signs/symptoms of physical distress.    Intensity  THRR unchanged      Progression   Progression  Continue to progress workloads to maintain intensity without signs/symptoms of physical distress.    Average METs  2       Resistance Training   Training Prescription  Yes    Weight  6 lb    Reps  10-15      Interval Training   Interval Training  No      Treadmill   MPH  1    Grade  0    Minutes  15    METs  1.77      NuStep   Level  2    SPM  80    Minutes  15    METs  2.4      REL-XR   Level  1.5    Speed  50    Minutes  15    METs  2       Nutrition:  Target Goals: Understanding of nutrition guidelines, daily intake of sodium <152m, cholesterol <2083m calories 30% from fat and 7% or less from saturated fats, daily to have 5 or more servings of fruits and vegetables.  Biometrics: Pre Biometrics - 04/01/18 1604      Pre Biometrics   Height  _0  (1.854 m)    Weight  229 lb 8 oz (104.1 kg)    Waist Circumference  44.5 inches    Hip Circumference  43 inches    Waist to Hip Ratio  1.03 %    BMI (Calculated)  30.29        Nutrition Therapy Plan and Nutrition Goals: Nutrition Therapy & Goals - 04/10/18 1250      Nutrition Therapy   Diet  DM    Protein (specify units)  12-13oz    Fiber  35 grams    Whole Grain Foods  3 servings   eats whole grains regularly   Saturated Fats  15 max. grams    Fruits and Vegetables  6 servings/day   8 ideal. eats fruits and vegetables regularly   Sodium  2000 grams   per MD recommendation, follows 2g sodium diet strictly     Personal Nutrition Goals   Nutrition Goal  Experiment with breakfast options that are a combination of carbohydrate + protein rather than predominantly carbohydrate in order to prevent hyperglycemia prior  to exercise    Personal Goal #2  Continue to work with your wife to keep sodium intake between 1500 and 2067m/day. Great job for finding lower sodium ingredient swaps and recipes!    Comments  He experiences dramatic BG changes within a matter of minutes, often dropping from the 300 range to 60 or even as low as 40. His wife tightly monitor his diet and keeps sodium intake lower than 20031mday. Pt typically eats 5 CHO  servings per meal and 1-2 at snacks for a total of 17-18 CHO servings per day, which they state was recommended by endocrynologist. Wife weighs out foods and makes most meals from scratch using low sodium ingredients. They eat fruits and vegetables daily and monitor potassium intake in addition to sodium r/t CKD. They try to avoid foods with preservatives and try to buy meats from local farms. If they eat out, wife looks up nutrition information ahead of time. HgbA1c 7.7 "best it's been in years" 3 weeks ago per wife. Breakfast: shredded wheat cereal, oatmeal, oatmeal muffins, eggs / sausage occasionally. Snacks: NSA Klondike bar, fruit, unsalted nuts. If he experiences hypoglycemia he corrects with applejuice which is effective.      Intervention Plan   Intervention  Prescribe, educate and counsel regarding individualized specific dietary modifications aiming towards targeted core components such as weight, hypertension, lipid management, diabetes, heart failure and other comorbidities.    Expected Outcomes  Short Term Goal: A plan has been developed with personal nutrition goals set during dietitian appointment.;Long Term Goal: Adherence to prescribed nutrition plan.;Short Term Goal: Understand basic principles of dietary content, such as calories, fat, sodium, cholesterol and nutrients.       Nutrition Assessments: Nutrition Assessments - 04/01/18 1407      MEDFICTS Scores   Pre Score  24       Nutrition Goals Re-Evaluation: Nutrition Goals Re-Evaluation    [place] 04/10/18 1332 05/08/18 1031           Goals   Nutrition Goal  Continue to work with your wife to keep sodium intake between 1500 and 200047may. Great job for finding lower sodium ingredient swaps and recipes!  Continue to work with your wife to keep sodium intake between 1500 and 2000m25mday; Experiment with breakfast options that are a combination of carbohydrate and protein rather than predominantly carbohydrate       Comment  His wife monitors his diet tightly, weighing out foods, making recipes from scratch, reading labels, and looking up nutrition information at restaurants ahead of time  He and his wife have continued to monitor his diet closely and he reports that the regular exercise he is getting in the program has helped to improve BG control (IE prevented such large fluctuations). He has been eating breakfast options such as home made oatmeal muffins, oatmeal and occasionally eggs and toast. All options are low sodium and low sugar per his wife's cooking      Expected Outcome  Continue to limit sodium intake, keeping below 2000mg89m when possible  He will keep up a regular exercise routine for general health and to continue to improve BG control. Maintain low sugar and sodium diet        Personal Goal #2 Re-Evaluation   Personal Goal #2  Experiment with breakfast options that are a combination of carbohydrate and protein rather than predominantly carbohydrate in order to prevent hyperglycemia prior to exercise  -         Nutrition Goals Discharge (  Final Nutrition Goals Re-Evaluation): Nutrition Goals Re-Evaluation - 05/08/18 1031      Goals   Nutrition Goal  Continue to work with your wife to keep sodium intake between 1500 and 2059m / day; Experiment with breakfast options that are a combination of carbohydrate and protein rather than predominantly carbohydrate    Comment  He and his wife have continued to monitor his diet closely and he reports that the regular exercise he is getting in the program has helped to improve BG control (IE prevented such large fluctuations). He has been eating breakfast options such as home made oatmeal muffins, oatmeal and occasionally eggs and toast. All options are low sodium and low sugar per his wife's cooking    Expected Outcome  He will keep up a regular exercise routine for general health and to continue to improve BG control. Maintain low sugar and sodium diet        Psychosocial: Target Goals: Acknowledge presence or absence of significant depression and/or stress, maximize coping skills, provide positive support system. Participant is able to verbalize types and ability to use techniques and skills needed for reducing stress and depression.   Initial Review & Psychosocial Screening: Initial Psych Review & Screening - 04/01/18 1357      Initial Review   Current issues with  Current Stress Concerns    Source of Stress Concerns  Chronic Illness    Comments  Going to the doctors and going to funerals make his stressed out. His breathing and there are things that he cant do anymore.      Family Dynamics   Good Support System?  Yes    Comments  His wife of 578years is his support system.      Barriers   Psychosocial barriers to participate in program  There are no identifiable barriers or psychosocial needs.;The patient should benefit from training in stress management and relaxation.      Screening Interventions   Interventions  Encouraged to exercise;Program counselor consult;Provide feedback about the scores to participant;To provide support and resources with identified psychosocial needs    Expected Outcomes  Short Term goal: Utilizing psychosocial counselor, staff and physician to assist with identification of specific Stressors or current issues interfering with healing process. Setting desired goal for each stressor or current issue identified.;Long Term Goal: Stressors or current issues are controlled or eliminated.;Short Term goal: Identification and review with participant of any Quality of Life or Depression concerns found by scoring the questionnaire.;Long Term goal: The participant improves quality of Life and PHQ9 Scores as seen by post scores and/or verbalization of changes       Quality of Life Scores:  Scores of 19 and below usually indicate a poorer quality of life in these areas.  A difference of  2-3 points is a clinically  meaningful difference.  A difference of 2-3 points in the total score of the Quality of Life Index has been associated with significant improvement in overall quality of life, self-image, physical symptoms, and general health in studies assessing change in quality of life.  PHQ-9: Recent Review Flowsheet Data    Dep[facility] 04/22/2018 04/01/2018 07/16/2017 03/19/2017 11/15/2016   Decreased Interest 0 0 0 0 0   Down, Depressed, Hopeless 0 0 0 0 0   PHQ - 2 Score 0 0 0 0 0   Altered sleeping 0 0 - - -   Tired, decreased energy 0 1 - - -   Change in appetite 0  0 - - -   Feeling bad or failure about yourself  0 0 - - -   Trouble concentrating 0 0 - - -   Moving slowly or fidgety/restless 0 0 - - -   Suicidal thoughts 0 0 - - -   PHQ-9 Score 0 1 - - -   Difficult doing work/chores Not difficult at all Not difficult at all - - -     Interpretation of Total Score  Total Score Depression Severity:  1-4 = Minimal depression, 5-9 = Mild depression, 10-14 = Moderate depression, 15-19 = Moderately severe depression, 20-27 = Severe depression   Psychosocial Evaluation and Intervention: Psychosocial Evaluation - 04/22/18 1118      Psychosocial Evaluation & Interventions   Interventions  Encouraged to exercise with the program and follow exercise prescription    Comments  Counselor follow up with Mr. Glazer Arabie) today much progress since coming into this program.  The blood flow to his right leg in particulary has improved significantly.  He has learned much from the education and particularly the dietician and nutritional aspects.  He continues to sleep well and manage his anxiety with medications and exercise.  He reports the staff here are extremely helpful and knowledgeable and he feels confident in their care.  Counselor commended Amon on his progress made.  He has lost several pounds and that is quite an accomplishment with being the week after Thanksgiving!  Staff will continue to  follow.    Expected Outcomes  Short:  Izea will continue to exercise to improve his blood flow; decrease his anxiety and for his overall health.  Long:  Yaseen will continue to make positive nutritional choices for his weight loss goal.     Continue Psychosocial Services   Follow up required by staff       Psychosocial Re-Evaluation: Psychosocial Re-Evaluation    [place] Name 05/29/18 1414             Psychosocial Re-Evaluation   Current issues with  Current Stress Concerns       Comments  Avari states that there is nothing new to report with his mental health. He states that sometimes he is able to exercise well on the treadmill with his right calve and sometimes it really bothers him. He has a positive outlook and is doing very well in the program.       Expected Outcomes  Short: Attend [facility] stress management education to decrease stress. Long: Maintain exercise Post [facility] to keep stress at a minimum.       Interventions  Encouraged to attend Pulmonary Rehabilitation for the exercise       Continue Psychosocial Services   Follow up required by staff          Psychosocial Discharge (Final Psychosocial Re-Evaluation): Psychosocial Re-Evaluation - 05/29/18 1414      Psychosocial Re-Evaluation   Current issues with  Current Stress Concerns    Comments  Keshon states that there is nothing new to report with his mental health. He states that sometimes he is able to exercise well on the treadmill with his right calve and sometimes it really bothers him. He has a positive outlook and is doing very well in the program.    Expected Outcomes  Short: Attend [facility] stress management education to decrease stress. Long: Maintain exercise Post [facility] to keep stress at a minimum.    Interventions  Encouraged to attend Pulmonary Rehabilitation for the exercise    Continue  Psychosocial Services   Follow up required by staff       Education: Education Goals: Education classes will be  provided on a weekly basis, covering required topics. Participant will state understanding/return demonstration of topics presented.  Learning Barriers/Preferences: Learning Barriers/Preferences - 04/01/18 1402      Learning Barriers/Preferences   Learning Barriers  Sight   wears glasses   Learning Preferences  None       Education Topics:  Initial Evaluation Education: - Verbal, written and demonstration of respiratory meds, oximetry and breathing techniques. Instruction on use of nebulizers and MDIs and importance of monitoring MDI activations.   Pulmonary Rehab from 05/31/2018 in [facility] Cardiac and Pulmonary Rehab  Date  04/01/18  Educator  [facility]  Instruction Review Code  1- Verbalizes Understanding      General Nutrition Guidelines/Fats and Fiber: -Group instruction provided by verbal, written material, models and posters to present the general guidelines for heart healthy nutrition. Gives an explanation and review of dietary fats and fiber.   Controlling Sodium/Reading Food Labels: -Group verbal and written material supporting the discussion of sodium use in heart healthy nutrition. Review and explanation with models, verbal and written materials for utilization of the food label.   Exercise Physiology & General Exercise Guidelines: - Group verbal and written instruction with models to review the exercise physiology of the cardiovascular system and associated critical values. Provides general exercise guidelines with specific guidelines to those with heart or lung disease.    Pulmonary Rehab from 05/31/2018 in [facility] Cardiac and Pulmonary Rehab  Date  04/26/18  Educator  [facility]  Instruction Review Code  1- Verbalizes Understanding      Aerobic Exercise & Resistance Training: - Gives group verbal and written instruction on the various components of exercise. Focuses on aerobic and resistive training programs and the benefits of this training and how to safely progress through these  programs.   Pulmonary Rehab from 05/31/2018 in [facility] Cardiac and Pulmonary Rehab  Date  05/17/18  Educator  [facility]   Instruction Review Code  1- Verbalizes Understanding      Flexibility, Balance, Mind/Body Relaxation: Provides group verbal/written instruction on the benefits of flexibility and balance training, including mind/body exercise modes such as yoga, pilates and tai chi.  Demonstration and skill practice provided.   Stress and Anxiety: - Provides group verbal and written instruction about the health risks of elevated stress and causes of high stress.  Discuss the correlation between heart/lung disease and anxiety and treatment options. Review healthy ways to manage with stress and anxiety.   Depression: - Provides group verbal and written instruction on the correlation between heart/lung disease and depressed mood, treatment options, and the stigmas associated with seeking treatment.   Exercise & Equipment Safety: - Individual verbal instruction and demonstration of equipment use and safety with use of the equipment.   Pulmonary Rehab from 05/31/2018 in [facility] Cardiac and Pulmonary Rehab  Date  04/01/18  Educator  [facility]  Instruction Review Code  1- Verbalizes Understanding      Infection Prevention: - Provides verbal and written material to individual with discussion of infection control including proper hand washing and proper equipment cleaning during exercise session.   Pulmonary Rehab from 05/31/2018 in [facility] Cardiac and Pulmonary Rehab  Date  04/01/18  Educator  [facility]  Instruction Review Code  1- Verbalizes Understanding      Falls Prevention: - Provides verbal and written material to individual with discussion of falls prevention and safety.  Pulmonary Rehab from 05/31/2018 in [facility] Cardiac and Pulmonary Rehab  Date  04/01/18  Educator  [facility]  Instruction Review Code  1- Verbalizes Understanding      Diabetes: - Individual verbal and written instruction to review  signs/symptoms of diabetes, desired ranges of glucose level fasting, after meals and with exercise. Advice that pre and post exercise glucose checks will be done for 3 sessions at entry of program.   Chronic Lung Diseases: - Group verbal and written instruction to review updates, respiratory medications, advancements in procedures and treatments. Discuss use of supplemental oxygen including available portable oxygen systems, continuous and intermittent flow rates, concentrators, personal use and safety guidelines. Review proper use of inhaler and spacers. Provide informative websites for self-education.    Pulmonary Rehab from 05/31/2018 in [facility] Cardiac and Pulmonary Rehab  Date  04/24/18  Educator  [facility]  Instruction Review Code  1- Verbalizes Understanding      Energy Conservation: - Provide group verbal and written instruction for methods to conserve energy, plan and organize activities. Instruct on pacing techniques, use of adaptive equipment and posture/positioning to relieve shortness of breath.   Triggers and Exacerbations: - Group verbal and written instruction to review types of environmental triggers and ways to prevent exacerbations. Discuss weather changes, air quality and the benefits of nasal washing. Review warning signs and symptoms to help prevent infections. Discuss techniques for effective airway clearance, coughing, and vibrations.   Pulmonary Rehab from 05/31/2018 in [facility] Cardiac and Pulmonary Rehab  Date  05/10/18  Educator  [facility]  Instruction Review Code  1- Verbalizes Understanding      AED/CPR: - Group verbal and written instruction with the use of models to demonstrate the basic use of the AED with the basic ABC's of resuscitation.   Anatomy and Physiology of the Lungs: - Group verbal and written instruction with the use of models to provide basic lung anatomy and physiology related to function, structure and complications of lung disease.   Pulmonary Rehab from  05/31/2018 in [facility] Cardiac and Pulmonary Rehab  Date  04/05/18  Educator  [facility]  Instruction Review Code  1- Verbalizes Understanding      Anatomy & Physiology of the Heart: - Group verbal and written instruction and models provide basic cardiac anatomy and physiology, with the coronary electrical and arterial systems. Review of Valvular disease and Heart Failure   Pulmonary Rehab from 05/31/2018 in [facility] Cardiac and Pulmonary Rehab  Date  05/24/18  Educator  [facility]  Instruction Review Code  1- Verbalizes Understanding      Cardiac Medications: - Group verbal and written instruction to review commonly prescribed medications for heart disease. Reviews the medication, class of the drug, and side effects.   Pulmonary Rehab from 05/31/2018 in [facility] Cardiac and Pulmonary Rehab  Date  05/31/18  Educator  [facility]  Instruction Review Code  1- Verbalizes Understanding      Know Your Numbers and Risk Factors: -Group verbal and written instruction about important numbers in your health.  Discussion of what are risk factors and how they play a role in the disease process.  Review of Cholesterol, Blood Pressure, Diabetes, and BMI and the role they play in your overall health.   Pulmonary Rehab from 05/31/2018 in [facility] Cardiac and Pulmonary Rehab  Date  05/08/18  Educator  [facility]  Instruction Review Code  1- Verbalizes Understanding      Sleep Hygiene: -Provides group verbal and written instruction about how sleep can affect your health.  Define sleep hygiene, discuss sleep cycles and impact of sleep habits. Review good sleep hygiene tips.    Pulmonary Rehab from 05/31/2018 in [facility] Cardiac and Pulmonary Rehab  Date  05/29/18  Educator  [facility]  Instruction Review Code  1- Verbalizes Understanding      Other: -Provides group and verbal instruction on various topics (see comments)    Knowledge Questionnaire Score: Knowledge Questionnaire Score - 04/01/18 1402      Knowledge Questionnaire Score   Pre Score   15/18   reviewed with patient       Core Components/Risk Factors/Patient Goals at Admission: Personal Goals and Risk Factors at Admission - 04/01/18 1404      Core Components/Risk Factors/Patient Goals on Admission    Weight Management  Yes;Weight Loss    Intervention  Weight Management: Develop a combined nutrition and exercise program designed to reach desired caloric intake, while maintaining appropriate intake of nutrient and fiber, sodium and fats, and appropriate energy expenditure required for the weight goal.;Weight Management: Provide education and appropriate resources to help participant work on and attain dietary goals.;Weight Management/Obesity: Establish reasonable short term and long term weight goals.    Admit Weight  229 lb 8 oz (104.1 kg)    Goal Weight: Short Term  225 lb (102.1 kg)    Goal Weight: Long Term  215 lb (97.5 kg)    Expected Outcomes  Short Term: Continue to assess and modify interventions until short term weight is achieved;Long Term: Adherence to nutrition and physical activity/exercise program aimed toward attainment of established weight goal;Weight Loss: Understanding of general recommendations for a balanced deficit meal plan, which promotes 1-2 lb weight loss per week and includes a negative energy balance of [phone] kcal/d;Understanding recommendations for meals to include 15-35% energy as protein, 25-35% energy from fat, 35-60% energy from carbohydrates, less than 254m of dietary cholesterol, 20-35 gm of total fiber daily;Understanding of distribution of calorie intake throughout the day with the consumption of 4-5 meals/snacks    Improve shortness of breath with ADL's  Yes    Intervention  Provide education, individualized exercise plan and daily activity instruction to help decrease symptoms of SOB with activities of daily living.    Expected Outcomes  Short Term: Improve cardiorespiratory fitness to achieve a reduction of symptoms when performing  ADLs;Long Term: Be able to perform more ADLs without symptoms or delay the onset of symptoms    Diabetes  Yes    Intervention  Provide education about signs/symptoms and action to take for hypo/hyperglycemia.;Provide education about proper nutrition, including hydration, and aerobic/resistive exercise prescription along with prescribed medications to achieve blood glucose in normal ranges: Fasting glucose 65-99 mg/dL    Expected Outcomes  Short Term: Participant verbalizes understanding of the signs/symptoms and immediate care of hyper/hypoglycemia, proper foot care and importance of medication, aerobic/resistive exercise and nutrition plan for blood glucose control.;Long Term: Attainment of HbA1C < 7%.    Heart Failure  Yes    Intervention  Provide a combined exercise and nutrition program that is supplemented with education, support and counseling about heart failure. Directed toward relieving symptoms such as shortness of breath, decreased exercise tolerance, and extremity edema.    Expected Outcomes  Improve functional capacity of life;Short term: Attendance in program 2-3 days a week with increased exercise capacity. Reported lower sodium intake. Reported increased fruit and vegetable intake. Reports medication compliance.;Short term: Daily weights obtained and reported for increase. Utilizing diuretic protocols set by physician.;Long term: Adoption of self-care skills  and reduction of barriers for early signs and symptoms recognition and intervention leading to self-care maintenance.    Hypertension  Yes    Intervention  Provide education on lifestyle modifcations including regular physical activity/exercise, weight management, moderate sodium restriction and increased consumption of fresh fruit, vegetables, and low fat dairy, alcohol moderation, and smoking cessation.;Monitor prescription use compliance.    Expected Outcomes  Short Term: Continued assessment and intervention until BP is < 140/91m HG  in hypertensive participants. < 130/864mHG in hypertensive participants with diabetes, heart failure or chronic kidney disease.;Long Term: Maintenance of blood pressure at goal levels.    Lipids  Yes    Intervention  Provide education and support for participant on nutrition & aerobic/resistive exercise along with prescribed medications to achieve LDL <7073mHDL >54m41m  Expected Outcomes  Short Term: Participant states understanding of desired cholesterol values and is compliant with medications prescribed. Participant is following exercise prescription and nutrition guidelines.;Long Term: Cholesterol controlled with medications as prescribed, with individualized exercise RX and with personalized nutrition plan. Value goals: LDL < 70mg70mL > 40 mg.       Core Components/Risk Factors/Patient Goals Review:  Goals and Risk Factor Review    Row Name 04/15/18 1458 05/29/18 1417           Core Components/Risk Factors/Patient Goals Review   Personal Goals Review  Lipids;Heart Failure;Improve shortness of breath with ADL's;Weight Management/Obesity;Diabetes  Lipids;Heart Failure;Improve shortness of breath with ADL's;Weight Management/Obesity;Diabetes      Review  LarryRoseveltes that his blood flow is not good to his right leg. Sometimes he has to take a break on the treadmill because it get painful. He is checking his sugar at home and here in the program. When he feels like he is getting low sometimes he likes to be checked. He brings apple juice incase his sugar is low after class. We will check his machine against ours if he would like to use his.  Patient has been maintaing his weight and his diabetes is under control. He still has issues with his blood flow in his right calve but is working hard to better his health. He has been getting more stamina to do his ADL's.       Expected Outcomes  Short: Attend [facility] regularly to improve shortness of breath with ADL's. Long: maintain independence with  ADL's   Short[facility] [place] and continue exercising.         Core Components/Risk Factors/Patient Goals at Discharge (Final Review):  Goals and Risk Factor Review - 05/29/18 1417      Core Components/Risk Factors/Patient Goals Review   Personal Goals Review  Lipids;Heart Failure;Improve shortness of breath with ADL's;Weight Management/Obesity;Diabetes    Review  Patient has been maintaing his weight and his diabetes is under control. He still has issues with his blood flow in his right calve but is working hard to better his health. He has been getting more stamina to do his ADL's.     Expected Outcomes  Short: [facility] [place] and continue exercising.       ITP Comments: ITP Comments    Row Name 04/01/18 1345 04/08/18 0836 05/06/18 0829 06/03/18 0835     ITP Comments  Medical Evaluation completed. Chart sent for review and changes to Dr. Mark Emily Filbertctor of [facility] [place] can be found in [facility] encounter 03/21/2018  30 day review completed. ITP sent to Dr. Mark Emily Filbert  Director of [place]. Continue with ITP unless changes are made by physician.  30 day review completed. ITP sent to Dr. Emily Filbert Director of [place]. Continue with ITP unless changes are made by physician.  30 day review completed. ITP sent to Dr. Emily Filbert Director of [place]. Continue with ITP unless changes are made by physician.       Comments: 30 day review

## 2018-06-07 ENCOUNTER — Encounter: Payer: Medicare Other | Admitting: *Deleted

## 2018-06-07 DIAGNOSIS — J449 Chronic obstructive pulmonary disease, unspecified: Secondary | ICD-10-CM

## 2018-06-07 NOTE — Progress Notes (Signed)
Daily Session Note  Patient Details  Name: Ernest Mclean MRN: [id number] Date of Birth: Aug 04, 1940 Referring Provider:     Pulmonary Rehab from 04/01/2018 in [facility] Cardiac and Pulmonary Rehab  Referring Provider  Kasa      Encounter Date: 06/07/2018  Check In: Session Check In - 06/07/18 1013      Check-In   Supervising physician immediately available to respond to emergencies  [facility] immediately available ER MD    Physician(s)  Dr. Corky Downs and Clearnce Hasten    Location  [facility]-Cardiac & Pulmonary Rehab    Staff Present  Renita Papa, RN Vickki Hearing, BA, ACSM CEP, Exercise Physiologist;Joseph Tessie Fass RCP,RRT,BSRT    Medication changes reported      No    Fall or balance concerns reported     No    Warm-up and Cool-down  Performed as group-led instruction    Resistance Training Performed  Yes    VAD Patient?  No    PAD/SET Patient?  No      Pain Assessment   Currently in Pain?  No/denies          Social History   Tobacco Use  Smoking Status Former Smoker  . Packs/day: 1.00  . Years: 30.00  . Pack years: 30.00  . Types: Cigarettes  . Last attempt to quit: 12/14/2001  . Years since quitting: 16.4  Smokeless Tobacco Never Used  Tobacco Comment   quit 12/14/2001    Goals Met:  Proper associated with RPD/PD & O2 Sat Independence with exercise equipment Exercise tolerated well No report of cardiac concerns or symptoms Strength training completed today  Goals Unmet:  Not Applicable  Comments: Pt able to follow exercise prescription today without complaint.  Will continue to monitor for progression.    Dr. Emily Filbert is Medical Director for [place] and [facility].

## 2018-06-10 ENCOUNTER — Encounter: Payer: Medicare Other | Admitting: *Deleted

## 2018-06-10 DIAGNOSIS — J449 Chronic obstructive pulmonary disease, unspecified: Secondary | ICD-10-CM | POA: Diagnosis not present

## 2018-06-10 NOTE — Progress Notes (Signed)
Daily Session Note  Patient Details  Name: KOSTANTINOS TALLMAN MRN: [id number] Date of Birth: March 24, 1941 Referring Provider:     Pulmonary Rehab from 04/01/2018 in [facility] Cardiac and Pulmonary Rehab  Referring Provider  Kasa      Encounter Date: 06/10/2018  Check In: Session Check In - 06/10/18 1015      Check-In   Supervising physician immediately available to respond to emergencies  [facility] immediately available ER MD    Physician(s)  Dr. Quentin Cornwall and Dr. Jimmye Norman    Location  [facility]-Cardiac & Pulmonary Rehab    Staff Present  Earlean Shawl, BS, ACSM CEP, Exercise Physiologist;Amanda Oletta Darter, BA, ACSM CEP, Exercise Physiologist;Joseph Tessie Fass RCP,RRT,BSRT    Medication changes reported      No    Fall or balance concerns reported     No    Tobacco Cessation  No Change    Warm-up and Cool-down  Performed as group-led instruction    Resistance Training Performed  Yes    VAD Patient?  No    PAD/SET Patient?  No      Pain Assessment   Currently in Pain?  No/denies    Multiple Pain Sites  No          Social History   Tobacco Use  Smoking Status Former Smoker  . Packs/day: 1.00  . Years: 30.00  . Pack years: 30.00  . Types: Cigarettes  . Last attempt to quit: 12/14/2001  . Years since quitting: 16.4  Smokeless Tobacco Never Used  Tobacco Comment   quit 12/14/2001    Goals Met:  Proper associated with RPD/PD & O2 Sat Independence with exercise equipment Exercise tolerated well No report of cardiac concerns or symptoms Strength training completed today  Goals Unmet:  Not Applicable  Comments: Pt able to follow exercise prescription today without complaint.  Will continue to monitor for progression.    Dr. Emily Filbert is Medical Director for [place] and [facility].

## 2018-06-12 ENCOUNTER — Encounter: Payer: Medicare Other | Admitting: *Deleted

## 2018-06-12 DIAGNOSIS — J449 Chronic obstructive pulmonary disease, unspecified: Secondary | ICD-10-CM

## 2018-06-12 NOTE — Progress Notes (Signed)
Daily Session Note  Patient Details  Name: Ernest Mclean MRN: [id number] Date of Birth: 1940/12/12 Referring Provider:     Pulmonary Rehab from 04/01/2018 in [facility] Cardiac and Pulmonary Rehab  Referring Provider  Kasa      Encounter Date: 06/12/2018  Check In: Session Check In - 06/12/18 1034      Check-In   Supervising physician immediately available to respond to emergencies  [facility] immediately available ER MD    Physician(s)  Drs. Kipp Laurence    Location  [facility]-Cardiac & Pulmonary Rehab    Staff Present  Alberteen Sam, MA, RCEP, CCRP, Exercise Physiologist;Joseph Foy [place], [place], ACSM CEP, Exercise Physiologist    Medication changes reported      No    Fall or balance concerns reported     No    Warm-up and Cool-down  Performed as group-led instruction    Resistance Training Performed  Yes    VAD Patient?  No    PAD/SET Patient?  No      Pain Assessment   Currently in Pain?  No/denies          Social History   Tobacco Use  Smoking Status Former Smoker  . Packs/day: 1.00  . Years: 30.00  . Pack years: 30.00  . Types: Cigarettes  . Last attempt to quit: 12/14/2001  . Years since quitting: 16.5  Smokeless Tobacco Never Used  Tobacco Comment   quit 12/14/2001    Goals Met:  Proper associated with RPD/PD & O2 Sat Independence with exercise equipment Using PLB without cueing & demonstrates good technique Exercise tolerated well No report of cardiac concerns or symptoms Strength training completed today  Goals Unmet:  Not Applicable  Comments: Pt able to follow exercise prescription today without complaint.  Will continue to monitor for progression.    Dr. Emily Filbert is Medical Director for [place] and [facility].

## 2018-06-14 DIAGNOSIS — J449 Chronic obstructive pulmonary disease, unspecified: Secondary | ICD-10-CM

## 2018-06-14 NOTE — Progress Notes (Signed)
Daily Session Note  Patient Details  Name: Ernest Mclean MRN: [id number] Date of Birth: [birth year] Referring Provider:     Pulmonary Rehab from 04/01/2018 in [facility] Cardiac and Pulmonary Rehab  Referring Provider  Kasa      Encounter Date: 06/14/2018  Check In: Session Check In - 06/14/18 1016      Check-In   Supervising physician immediately available to respond to emergencies  [facility] immediately available ER MD    Physician(s)  Dr. Mariea Clonts and Corky Downs    Location  [facility]-Cardiac & Pulmonary Rehab    Staff Present  Justin Mend RCP,RRT,BSRT;Amanda Oletta Darter, [place], ACSM CEP, Exercise Physiologist;Meredith Sherryll Burger, RN BSN    Medication changes reported      No    Fall or balance concerns reported     No    Warm-up and Cool-down  Performed as group-led Higher education careers adviser Performed  Yes    VAD Patient?  No    PAD/SET Patient?  No      Pain Assessment   Currently in Pain?  No/denies          Social History   Tobacco Use  Smoking Status Former Smoker  . Packs/day: 1.00  . Years: 30.00  . Pack years: 30.00  . Types: Cigarettes  . Last attempt to quit: 12/14/2001  . Years since quitting: 16.5  Smokeless Tobacco Never Used  Tobacco Comment   quit 12/14/2001    Goals Met:  Independence with exercise equipment Exercise tolerated well No report of cardiac concerns or symptoms Strength training completed today  Goals Unmet:  Not Applicable  Comments: Pt able to follow exercise prescription today without complaint.  Will continue to monitor for progression.    Dr. Emily Filbert is Medical Director for [place] and [facility].

## 2018-06-17 ENCOUNTER — Encounter: Payer: Medicare Other | Admitting: *Deleted

## 2018-06-17 DIAGNOSIS — J449 Chronic obstructive pulmonary disease, unspecified: Secondary | ICD-10-CM | POA: Diagnosis not present

## 2018-06-17 NOTE — Progress Notes (Signed)
Daily Session Note  Patient Details  Name: Ernest Mclean MRN: [id number] Date of Birth: [birth year] Referring Provider:     Pulmonary Rehab from 04/01/2018 in [facility] Cardiac and Pulmonary Rehab  Referring Provider  Kasa      Encounter Date: 06/17/2018  Check In: Session Check In - 06/17/18 1027      Check-In   Supervising physician immediately available to respond to emergencies  [facility] immediately available ER MD    Physician(s)  Dr. Jodell Cipro and Dr. Quentin Cornwall    Location  [facility]-Cardiac & Pulmonary Rehab    Staff Present  Earlean Shawl, BS, ACSM CEP, Exercise Physiologist;Joseph [facility], [place], ACSM CEP, Exercise Physiologist    Medication changes reported      No    Fall or balance concerns reported     No    Tobacco Cessation  No Change    Warm-up and Cool-down  Performed as group-led instruction    Resistance Training Performed  Yes    VAD Patient?  No    PAD/SET Patient?  No      Pain Assessment   Currently in Pain?  No/denies    Multiple Pain Sites  No          Social History   Tobacco Use  Smoking Status Former Smoker  . Packs/day: 1.00  . Years: 30.00  . Pack years: 30.00  . Types: Cigarettes  . Last attempt to quit: 12/14/2001  . Years since quitting: 16.5  Smokeless Tobacco Never Used  Tobacco Comment   quit 12/14/2001    Goals Met:  Proper associated with RPD/PD & O2 Sat Independence with exercise equipment Exercise tolerated well No report of cardiac concerns or symptoms Strength training completed today  Goals Unmet:  Not Applicable  Comments: Pt able to follow exercise prescription today without complaint.  Will continue to monitor for progression.    Dr. Emily Filbert is Medical Director for [place] and [facility].

## 2018-06-19 DIAGNOSIS — J449 Chronic obstructive pulmonary disease, unspecified: Secondary | ICD-10-CM

## 2018-06-19 NOTE — Progress Notes (Signed)
Daily Session Note  Patient Details  Name: Ernest Mclean MRN: [id number] Date of Birth: [birth year] Referring Provider:     Pulmonary Rehab from 04/01/2018 in [facility] Cardiac and Pulmonary Rehab  Referring Provider  Kasa      Encounter Date: 06/19/2018  Check In: Session Check In - 06/19/18 1027      Check-In   Supervising physician immediately available to respond to emergencies  [facility] immediately available ER MD    Physician(s)  Jimmye Norman and [facility]    Location  [facility]-Cardiac & Pulmonary Rehab    Staff Present  Alberteen Sam, MA, RCEP, CCRP, Exercise Physiologist;Joseph Foy [place], [place], ACSM CEP, Exercise Physiologist    Medication changes reported      No    Fall or balance concerns reported     No    Warm-up and Cool-down  Performed as group-led instruction    Resistance Training Performed  Yes    VAD Patient?  No    PAD/SET Patient?  No      Pain Assessment   Currently in Pain?  No/denies    Multiple Pain Sites  No          Social History   Tobacco Use  Smoking Status Former Smoker  . Packs/day: 1.00  . Years: 30.00  . Pack years: 30.00  . Types: Cigarettes  . Last attempt to quit: 12/14/2001  . Years since quitting: 16.5  Smokeless Tobacco Never Used  Tobacco Comment   quit 12/14/2001    Goals Met:  Proper associated with RPD/PD & O2 Sat Independence with exercise equipment Exercise tolerated well Strength training completed today  Goals Unmet:  Not Applicable  Comments: Pt able to follow exercise prescription today without complaint.  Will continue to monitor for progression.    Dr. Emily Filbert is Medical Director for [place] and [facility].

## 2018-06-21 DIAGNOSIS — J449 Chronic obstructive pulmonary disease, unspecified: Secondary | ICD-10-CM | POA: Diagnosis not present

## 2018-06-21 NOTE — Progress Notes (Signed)
Daily Session Note  Patient Details  Name: Ernest Mclean MRN: [id number] Date of Birth: [birth year] Referring Provider:     Pulmonary Rehab from 04/01/2018 in [facility] Cardiac and Pulmonary Rehab  Referring Provider  Kasa      Encounter Date: 06/21/2018  Check In:      Social History   Tobacco Use  Smoking Status Former Smoker  . Packs/day: 1.00  . Years: 30.00  . Pack years: 30.00  . Types: Cigarettes  . Last attempt to quit: 12/14/2001  . Years since quitting: 16.5  Smokeless Tobacco Never Used  Tobacco Comment   quit 12/14/2001    Goals Met:  Proper associated with RPD/PD & O2 Sat Independence with exercise equipment Exercise tolerated well Strength training completed today  Goals Unmet:  Not Applicable  Comments: Pt able to follow exercise prescription today without complaint.  Will continue to monitor for progression.    Dr. Emily Filbert is Medical Director for [place] and [facility].

## 2018-06-24 ENCOUNTER — Encounter: Payer: Medicare Other | Attending: Internal Medicine

## 2018-06-24 DIAGNOSIS — J449 Chronic obstructive pulmonary disease, unspecified: Secondary | ICD-10-CM | POA: Insufficient documentation

## 2018-06-26 DIAGNOSIS — J449 Chronic obstructive pulmonary disease, unspecified: Secondary | ICD-10-CM

## 2018-06-26 NOTE — Progress Notes (Signed)
Daily Session Note  Patient Details  Name: Ernest Mclean MRN: [id number] Date of Birth: [birth year] Referring Provider:     Pulmonary Rehab from 04/01/2018 in [facility] Cardiac and Pulmonary Rehab  Referring Provider  Kasa      Encounter Date: 06/26/2018  Check In: Session Check In - 06/26/18 1127      Check-In   Supervising physician immediately available to respond to emergencies  See telemetry face sheet for immediately available ER MD    Physician(s)  Alfred Levins and Jimmye Norman    Location  [facility]-Cardiac & Pulmonary Rehab    Staff Present  Alberteen Sam, MA, RCEP, CCRP, Exercise Physiologist;Joseph Foy [place], [place], ACSM CEP, Exercise Physiologist    Medication changes reported      No    Fall or balance concerns reported     No    Warm-up and Cool-down  Performed as group-led instruction    Resistance Training Performed  Yes    VAD Patient?  No    PAD/SET Patient?  No      Pain Assessment   Currently in Pain?  No/denies    Multiple Pain Sites  No          Social History   Tobacco Use  Smoking Status Former Smoker  . Packs/day: 1.00  . Years: 30.00  . Pack years: 30.00  . Types: Cigarettes  . Last attempt to quit: 12/14/2001  . Years since quitting: 16.5  Smokeless Tobacco Never Used  Tobacco Comment   quit 12/14/2001    Goals Met:  Independence with exercise equipment Exercise tolerated well No report of cardiac concerns or symptoms Strength training completed today  Goals Unmet:  Not Applicable  Comments: Pt able to follow exercise prescription today without complaint.  Will continue to monitor for progression.    Dr. Emily Filbert is Medical Director for [place] and [facility].

## 2018-06-28 ENCOUNTER — Encounter: Payer: Medicare Other | Admitting: *Deleted

## 2018-06-28 DIAGNOSIS — J449 Chronic obstructive pulmonary disease, unspecified: Secondary | ICD-10-CM | POA: Diagnosis not present

## 2018-06-28 NOTE — Progress Notes (Signed)
Daily Session Note  Patient Details  Name: Ernest Mclean MRN: [id number] Date of Birth: 07-30-1940 Referring Provider:     Pulmonary Rehab from 04/01/2018 in [facility] Cardiac and Pulmonary Rehab  Referring Provider  Kasa      Encounter Date: 06/28/2018  Check In:      Social History   Tobacco Use  Smoking Status Former Smoker  . Packs/day: 1.00  . Years: 30.00  . Pack years: 30.00  . Types: Cigarettes  . Last attempt to quit: 12/14/2001  . Years since quitting: 16.5  Smokeless Tobacco Never Used  Tobacco Comment   quit 12/14/2001    Goals Met:  Proper associated with RPD/PD & O2 Sat Independence with exercise equipment Using PLB without cueing & demonstrates good technique Exercise tolerated well No report of cardiac concerns or symptoms Strength training completed today  Goals Unmet:  Not Applicable  Comments: Pt able to follow exercise prescription today without complaint.  Will continue to monitor for progression.    Dr. Emily Filbert is Medical Director for [place] and [facility].

## 2018-07-01 ENCOUNTER — Encounter: Payer: Medicare Other | Admitting: *Deleted

## 2018-07-01 DIAGNOSIS — J449 Chronic obstructive pulmonary disease, unspecified: Secondary | ICD-10-CM | POA: Diagnosis not present

## 2018-07-01 NOTE — Progress Notes (Signed)
Daily Session Note  Patient Details  Name: Ernest Mclean MRN: [id number] Date of Birth: 05/31/1940 Referring Provider:     Pulmonary Rehab from 04/01/2018 in [facility] Cardiac and Pulmonary Rehab  Referring Provider  Kasa      Encounter Date: 07/01/2018  Check In: Session Check In - 07/01/18 1015      Check-In   Supervising physician immediately available to respond to emergencies  [facility] immediately available ER MD    Physician(s)  Dr. Kinner and Dr. Schaevitz     Location  [facility]-Cardiac & Pulmonary Rehab    Staff Present  Kelly Hayes, BS, ACSM CEP, Exercise Physiologist;Joseph Hood RCP,RRT,BSRT;Amanda Sommer, BA, ACSM CEP, Exercise Physiologist    Medication changes reported      No    Fall or balance concerns reported     No    Tobacco Cessation  No Change    Warm-up and Cool-down  Performed as group-led instruction    Resistance Training Performed  Yes    VAD Patient?  No    PAD/SET Patient?  No      Pain Assessment   Currently in Pain?  No/denies    Multiple Pain Sites  No          Social History   Tobacco Use  Smoking Status Former Smoker  . Packs/day: 1.00  . Years: 30.00  . Pack years: 30.00  . Types: Cigarettes  . Last attempt to quit: 12/14/2001  . Years since quitting: 16.5  Smokeless Tobacco Never Used  Tobacco Comment   quit 12/14/2001    Goals Met:  Proper associated with RPD/PD & O2 Sat Independence with exercise equipment Exercise tolerated well No report of cardiac concerns or symptoms Strength training completed today  Goals Unmet:  Not Applicable  Comments: Pt able to follow exercise prescription today without complaint.  Will continue to monitor for progression.    Dr. Mark Miller is Medical Director for [facility] and [facility]. 

## 2018-07-01 NOTE — Progress Notes (Signed)
Pulmonary Individual Treatment Plan  Patient Details  Name: Ernest Mclean MRN: [id number] Date of Birth: 11-19-1940 Referring Provider:     Pulmonary Rehab from 04/01/2018 in [facility] Cardiac and Pulmonary Rehab  Referring Provider  Kasa      Initial Encounter Date:    Pulmonary Rehab from 04/01/2018 in [facility] Cardiac and Pulmonary Rehab  Date  04/01/18      Visit Diagnosis: Chronic obstructive pulmonary disease, unspecified COPD type ([place])  Patient's Home Medications on Admission:  Current Outpatient Medications:  .  albuterol (PROVENTIL HFA;VENTOLIN HFA) 108 (90 Base) MCG/ACT inhaler, Inhale 2 puffs into the lungs every 4 (four) hours as needed for wheezing or shortness of breath., Disp: 25.5 Inhaler, Rfl: 1 .  amLODipine (NORVASC) 5 MG tablet, Take 5 mg by mouth daily. , Disp: , Rfl:  .  ammonium lactate (AMLACTIN) 12 % cream, Apply 1 g topically as needed for dry skin., Disp: , Rfl:  .  apixaban (ELIQUIS) 5 MG TABS tablet, Take 5 mg by mouth 2 (two) times daily., Disp: , Rfl:  .  aspirin EC 81 MG tablet, Take 81 mg by mouth daily., Disp: , Rfl:  .  citalopram (CELEXA) 20 MG tablet, Take 20 mg by mouth at bedtime. , Disp: , Rfl: 5 .  CRANBERRY PO, Take 820 mg by mouth daily., Disp: , Rfl:  .  cyanocobalamin 500 MCG tablet, Take 500 mcg by mouth daily., Disp: , Rfl:  .  desonide (DESOWEN) 0.05 % cream, Apply topically., Disp: , Rfl:  .  diazepam (VALIUM) 5 MG tablet, Take 5 mg by mouth every 12 (twelve) hours as needed for anxiety. , Disp: , Rfl:  .  ergocalciferol (VITAMIN D2) 50000 units capsule, Take 50,000 Units by mouth every 30 (thirty) days., Disp: , Rfl:  .  furosemide (LASIX) 20 MG tablet, Take 20 mg by mouth daily. Take one tablet daily.  Can take extra tablet in afternoon if increase weight gain of 3 pounds or increased swelling., Disp: , Rfl:  .  insulin glargine (LANTUS) 100 UNIT/ML injection, Inject 70 Units into the skin at bedtime., Disp: , Rfl:  .  insulin lispro  (HUMALOG) 100 UNIT/ML injection, Inject 15-25 Units into the skin 3 (three) times daily before meals. 15 units before breakfast, 25 units before lunch, and 25 units before supper plus additional units for sliding scale, Disp: , Rfl:  .  ketoconazole (NIZORAL) 2 % shampoo, Apply 1 application topically 2 (two) times a week., Disp: , Rfl:  .  lisinopril (PRINIVIL,ZESTRIL) 5 MG tablet, Take 5 mg by mouth daily., Disp: , Rfl:  .  metFORMIN (GLUCOPHAGE) 500 MG tablet, Take 1,000 mg by mouth every evening. , Disp: , Rfl:  .  metoprolol (LOPRESSOR) 100 MG tablet, Take 100 mg by mouth 2 (two) times daily., Disp: , Rfl: 0 .  OMEGA-3 FATTY ACIDS-VITAMIN E PO, Take 1 capsule by mouth daily., Disp: , Rfl:  .  pravastatin (PRAVACHOL) 20 MG tablet, Take 20 mg by mouth at bedtime., Disp: , Rfl: 3 .  tiotropium (SPIRIVA) 18 MCG inhalation capsule, Place 18 mcg into inhaler and inhale daily., Disp: , Rfl:  .  vitamin C (ASCORBIC ACID) 500 MG tablet, Take 500 mg by mouth daily., Disp: , Rfl:  .  WIXELA INHUB 500-50 MCG/DOSE AEPB, TAKE 1 PUFF BY MOUTH TWICE A DAY, Disp: 180 each, Rfl: 1  Past Medical History: Past Medical History:  Diagnosis Date  . Atrial fibrillation ([place])   . Basal  cell carcinoma 06/2017   Left Ear  . CAD (coronary artery disease)   . CKD (chronic kidney disease)   . Congestive heart failure ([place])   . COPD (chronic obstructive pulmonary disease) ([place])   . Diabetes ([place])   . Hypertension   . OSA on CPAP   . 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 Packs/day: 1.00  . Years: 30.00  . Pack years: 30.00  . Types: Cigarettes  . Last attempt to quit: 12/14/2001  . Years since quitting: 16.5  Smokeless Tobacco Never Used  Tobacco Comment   quit 12/14/2001    Labs: Recent Review Flowsheet Data    Labs for ITP Cardiac and Pulmonary Rehab Latest Ref Rng & Units 06/20/2014 06/23/2014 05/13/2015 08/01/2016    Cholestrol 0 - 200 mg/dL 160 - - -   LDLCALC 0 - 100 mg/dL 70 - - -   HDL 40 - 60 mg/dL 28(Ernest Mclean) - - -   Trlycerides 0 - 200 mg/dL 311(H) - - -   Hemoglobin A1c 4.8 - 5.6 % - 10.8(H) 7.9(H) 8.4(H)   HCO3 20.0 - 28.0 mmol/Ernest Mclean - - - 32.4(H)       Pulmonary Assessment Scores: Pulmonary Assessment Scores    Row Name 04/01/18 1412         ADL UCSD   ADL Phase  Entry     SOB Score total  3     Rest  0     Walk  0     Stairs  0     Bath  0     Dress  0     Shop  0       CAT Score   CAT Score  4       mMRC Score   mMRC Score  1        Pulmonary Function Assessment: Pulmonary Function Assessment - 04/01/18 1413      Initial Spirometry Results   FVC%  64 %    FEV1%  68 %    FEV1/FVC Ratio  77    Comments  test performed on 02/10/2015      Post Bronchodilator Spirometry Results   FVC%  70 %    FEV1%  75 %    FEV1/FVC Ratio  78    Comments  test performed on 02/10/2015      Breath   Bilateral Breath Sounds  Clear    Shortness of Breath  Yes;Limiting activity       Exercise Target Goals: Exercise Program Goal: Individual exercise prescription set using results from initial 6 min walk test and THRR while considering  patient's activity barriers and safety.   Exercise Prescription Goal: Initial exercise prescription builds to 30-45 minutes a day of aerobic activity, 2-3 days per week.  Home exercise guidelines will be given to patient during program as part of exercise prescription that the participant will acknowledge.  Activity Barriers & Risk Stratification:   6 Minute Walk: 6 Minute Walk    Row Name 04/01/18 1606         6 Minute Walk   Phase  Initial     Distance  733 feet     Walk Time  6 minutes     # of Rest Breaks  0     MPH  1.39     METS  1.45  RPE  13     Perceived Dyspnea   0     VO2 Peak  5.06     Symptoms  Yes (comment)     Comments  R calf pain     Resting HR  61 bpm     Resting BP  148/64     Resting Oxygen Saturation   95 %      Exercise Oxygen Saturation  during 6 min walk  92 %     Max Ex. HR  76 bpm     Max Ex. BP  156/60     2 Minute Post BP  142/64       Interval HR   1 Minute HR  59     2 Minute HR  58     3 Minute HR  61     5 Minute HR  65     6 Minute HR  76     2 Minute Post HR  59     Interval Heart Rate?  Yes       Interval Oxygen   Interval Oxygen?  Yes     Baseline Oxygen Saturation %  95 %     1 Minute Oxygen Saturation %  93 %     1 Minute Liters of Oxygen  0 Ernest Mclean     2 Minute Oxygen Saturation %  94 %     2 Minute Liters of Oxygen  0 Ernest Mclean     3 Minute Oxygen Saturation %  93 %     3 Minute Liters of Oxygen  0 Ernest Mclean     4 Minute Liters of Oxygen  0 Ernest Mclean     5 Minute Oxygen Saturation %  92 %     5 Minute Liters of Oxygen  0 Ernest Mclean     6 Minute Oxygen Saturation %  94 %     6 Minute Liters of Oxygen  0 Ernest Mclean     2 Minute Post Oxygen Saturation %  96 %     2 Minute Post Liters of Oxygen  0 Ernest Mclean       Oxygen Initial Assessment: Oxygen Initial Assessment - 04/01/18 1356      Home Oxygen   Home Oxygen Device  Home Concentrator;E-Tanks    Sleep Oxygen Prescription  Continuous    Liters per minute  2    Home Exercise Oxygen Prescription  None    Home at Rest Exercise Oxygen Prescription  None    Compliance with Home Oxygen Use  Yes      Initial 6 min Walk   Oxygen Used  None      Program Oxygen Prescription   Program Oxygen Prescription  None      Intervention   Short Term Goals  To learn and demonstrate proper use of respiratory medications;To learn and demonstrate proper pursed lip breathing techniques or other breathing techniques.;To learn and understand importance of maintaining oxygen saturations>88%;To learn and understand importance of monitoring SPO2 with pulse oximeter and demonstrate accurate use of the pulse oximeter.;To learn and exhibit compliance with exercise, home and travel O2 prescription    Long  Term Goals  Exhibits compliance with exercise, home and travel O2 prescription;Verbalizes  importance of monitoring SPO2 with pulse oximeter and return demonstration;Maintenance of O2 saturations>88%;Exhibits proper breathing techniques, such as pursed lip breathing or other method taught during program session;Compliance with respiratory medication;Demonstrates proper use of MDI's       Oxygen Re-Evaluation:  Oxygen Re-Evaluation    Row Name 04/05/18 1016 04/15/18 1452 05/24/18 1040 06/12/18 1430       Program Oxygen Prescription   Program Oxygen Prescription  None  None  None  None      Home Oxygen   Home Oxygen Device  Home Concentrator;E-Tanks  Home Concentrator;E-Tanks  Home Concentrator;E-Tanks  Home Concentrator;E-Tanks    Sleep Oxygen Prescription  Continuous  Continuous  Continuous  Continuous    Liters per minute  _0 Home Exercise Oxygen Prescription  None  None  None  None    Home at Rest Exercise Oxygen Prescription  None  None  None  None    Compliance with Home Oxygen Use  Yes  Yes  Yes  Yes      Goals/Expected Outcomes   Short Term Goals  To learn and demonstrate proper use of respiratory medications;To learn and demonstrate proper pursed lip breathing techniques or other breathing techniques.;To learn and understand importance of maintaining oxygen saturations>88%;To learn and understand importance of monitoring SPO2 with pulse oximeter and demonstrate accurate use of the pulse oximeter.;To learn and exhibit compliance with exercise, home and travel O2 prescription  To learn and demonstrate proper use of respiratory medications;To learn and demonstrate proper pursed lip breathing techniques or other breathing techniques.;To learn and understand importance of maintaining oxygen saturations>88%;To learn and understand importance of monitoring SPO2 with pulse oximeter and demonstrate accurate use of the pulse oximeter.;To learn and exhibit compliance with exercise, home and travel O2 prescription  To learn and demonstrate proper use of respiratory  medications;To learn and demonstrate proper pursed lip breathing techniques or other breathing techniques.;To learn and understand importance of maintaining oxygen saturations>88%;To learn and understand importance of monitoring SPO2 with pulse oximeter and demonstrate accurate use of the pulse oximeter.;To learn and exhibit compliance with exercise, home and travel O2 prescription  To learn and demonstrate proper use of respiratory medications;To learn and demonstrate proper pursed lip breathing techniques or other breathing techniques.;To learn and understand importance of maintaining oxygen saturations>88%;To learn and understand importance of monitoring SPO2 with pulse oximeter and demonstrate accurate use of the pulse oximeter.;To learn and exhibit compliance with exercise, home and travel O2 prescription    Long  Term Goals  Exhibits compliance with exercise, home and travel O2 prescription;Verbalizes importance of monitoring SPO2 with pulse oximeter and return demonstration;Maintenance of O2 saturations>88%;Exhibits proper breathing techniques, such as pursed lip breathing or other method taught during program session;Compliance with respiratory medication;Demonstrates proper use of MDI's  Exhibits compliance with exercise, home and travel O2 prescription;Verbalizes importance of monitoring SPO2 with pulse oximeter and return demonstration;Maintenance of O2 saturations>88%;Exhibits proper breathing techniques, such as pursed lip breathing or other method taught during program session;Compliance with respiratory medication;Demonstrates proper use of MDI's  Exhibits compliance with exercise, home and travel O2 prescription;Verbalizes importance of monitoring SPO2 with pulse oximeter and return demonstration;Maintenance of O2 saturations>88%;Exhibits proper breathing techniques, such as pursed lip breathing or other method taught during program session;Compliance with respiratory medication;Demonstrates  proper use of MDI's  Exhibits compliance with exercise, home and travel O2 prescription;Verbalizes importance of monitoring SPO2 with pulse oximeter and return demonstration;Maintenance of O2 saturations>88%;Exhibits proper breathing techniques, such as pursed lip breathing or other method taught during program session;Compliance with respiratory medication;Demonstrates proper use of MDI's    Comments  Reviewed PLB technique with pt.  Talked about how it work and it's important to maintaining his exercise saturations.  Ernest Mclean checks his oxygen at home with his own pulse oximeter. He wears his CPAP with a 2 liter bleed in every night. He knows what his resting heart rate is and what his oxygen runs at home. Informed him to check his oxygen when he is exerting himself to make sure he is staying above 88 percent.  His breathing feels better since exercising. He has been between 93 and 97 percent oxygen when he is out shopping and exerting himself. He has no questions with his respiratory medications. He is going to try to increase his speeds and resistance to help with his shortness of breath.  Ernest Mclean wants to see if there is a correlation between this oxygen and his right calve. He states that the doctor is wondering the same thing. Explained to patient that his oxygen levels are good and his blood flow is something that is making his leg hurt. He states that his leg is feeling a little better and is continuing to better himself.     Goals/Expected Outcomes  Short: Become more profiecient at using PLB.   Long: Become independent at using PLB.  Short: monitor oxygen at home with exertion. Long: maintain oxygen saturations above 88 percent independently.  Short: increase levels on machines. Long: maintain shortness of breath with increased exercise.  Short: talk to doctor about his oxygen levels and if there is any correlation with his leg. Long: maintain exercise to improve blood flow to his leg.       Oxygen  Discharge (Final Oxygen Re-Evaluation): Oxygen Re-Evaluation - 06/12/18 1430      Program Oxygen Prescription   Program Oxygen Prescription  None      Home Oxygen   Home Oxygen Device  Home Concentrator;E-Tanks    Sleep Oxygen Prescription  Continuous    Liters per minute  2    Home Exercise Oxygen Prescription  None    Home at Rest Exercise Oxygen Prescription  None    Compliance with Home Oxygen Use  Yes      Goals/Expected Outcomes   Short Term Goals  To learn and demonstrate proper use of respiratory medications;To learn and demonstrate proper pursed lip breathing techniques or other breathing techniques.;To learn and understand importance of maintaining oxygen saturations>88%;To learn and understand importance of monitoring SPO2 with pulse oximeter and demonstrate accurate use of the pulse oximeter.;To learn and exhibit compliance with exercise, home and travel O2 prescription    Long  Term Goals  Exhibits compliance with exercise, home and travel O2 prescription;Verbalizes importance of monitoring SPO2 with pulse oximeter and return demonstration;Maintenance of O2 saturations>88%;Exhibits proper breathing techniques, such as pursed lip breathing or other method taught during program session;Compliance with respiratory medication;Demonstrates proper use of MDI's    Comments  Ernest Mclean wants to see if there is a correlation between this oxygen and his right calve. He states that the doctor is wondering the same thing. Explained to patient that his oxygen levels are good and his blood flow is something that is making his leg hurt. He states that his leg is feeling a little better and is continuing to better himself.     Goals/Expected Outcomes  Short: talk to doctor about his oxygen levels and if there is any correlation with his leg. Long: maintain exercise to improve blood flow to his leg.       Initial Exercise Prescription: Initial Exercise Prescription - 04/01/18 1600      Date of  Initial Exercise RX and Referring Provider  Date  04/01/18    Referring Provider  Kasa      Treadmill   MPH  1    Grade  0    Minutes  15   as tolerated - stop when needed to rest   METs  1.77      NuStep   Level  1    SPM  80    Minutes  15    METs  1.5      REL-XR   Level  1    Speed  50    Minutes  15    METs  1.5      Prescription Details   Frequency (times per week)  3    Duration  Progress to 45 minutes of aerobic exercise without signs/symptoms of physical distress      Intensity   THRR 40-80% of Max Heartrate  94-128    Ratings of Perceived Exertion  11-13    Perceived Dyspnea  0-4      Resistance Training   Training Prescription  Yes    Weight  3 lb    Reps  10-15       Perform Capillary Blood Glucose checks as needed.  Exercise Prescription Changes: Exercise Prescription Changes    Row Name 04/11/18 0800 04/25/18 0900 05/08/18 1200 05/21/18 1300 06/05/18 1300     Response to Exercise   Blood Pressure (Admit)  130/60  128/60  122/58  132/60  112/54   Blood Pressure (Exercise)  142/80  -  -  -  -   Blood Pressure (Exit)  96/54  124/64  112/60  128/64  110/70   Heart Rate (Admit)  60 bpm  58 bpm  57 bpm  90 bpm  69 bpm   Heart Rate (Exercise)  60 bpm  120 bpm  70 bpm  86 bpm  60 bpm   Heart Rate (Exit)  63 bpm  62 bpm  64 bpm  63 bpm  63 bpm   Oxygen Saturation (Admit)  95 %  96 %  95 %  93 %  95 %   Oxygen Saturation (Exercise)  97 %  96 %  90 %  93 %  93 %   Oxygen Saturation (Exit)  96 %  98 %  97 %  96 %  97 %   Rating of Perceived Exertion (Exercise)  _0 Perceived Dyspnea (Exercise)  _1 Symptoms  none  none  none  none  none   Comments  day 2 exercise  -  -  -  -   Duration  Continue with 45 min of aerobic exercise without signs/symptoms of physical distress.  Continue with 45 min of aerobic exercise without signs/symptoms of physical distress.  Progress to 30 minutes of  aerobic without signs/symptoms of physical  distress  Continue with 45 min of aerobic exercise without signs/symptoms of physical distress.  Continue with 45 min of aerobic exercise without signs/symptoms of physical distress.   Intensity  Other (comment) not reaching THR but RPE 13  Other (comment) HR 120 not usual  THRR unchanged  THRR unchanged  THRR unchanged     Progression   Progression  Continue to progress workloads to maintain intensity without signs/symptoms of physical distress.  Continue to progress workloads to maintain intensity without signs/symptoms of physical distress.  Continue to progress workloads to maintain  intensity without signs/symptoms of physical distress.  Continue to progress workloads to maintain intensity without signs/symptoms of physical distress.  Continue to progress workloads to maintain intensity without signs/symptoms of physical distress.   Average METs  2.13  2  2._0 Resistance Training   Training Prescription  Yes  Yes  Yes  Yes  Yes   Weight  3 lb  3 lb  6  6 lb  6 lb   Reps  10-15  10-15  10-15  10-15  10-15     Interval Training   Interval Training  -  -  No  No  No     Treadmill   MPH  _1 1.1   Grade  0  0  0  0  0.5   Minutes  _2 METs  1.77  1.77  1.77  1.77  1.92     NuStep   Level  _3 SPM  80  80  80  80  80   Minutes  _4 METs  3  2.4  2.1  2.4  2.3     REL-XR   Level  1  -  1.5  1.5  1.5   Speed  50  -  50  50  50   Minutes  15  -  _5 METs  1.7  -  1.5  2  1.9   Row Name 06/20/18 0900             Response to Exercise   Blood Pressure (Admit)  130/58       Blood Pressure (Exit)  122/58       Heart Rate (Admit)  65 bpm       Heart Rate (Exercise)  64 bpm       Heart Rate (Exit)  65 bpm       Oxygen Saturation (Admit)  97 %       Oxygen Saturation (Exercise)  94 %       Oxygen Saturation (Exit)  98 %       Rating of Perceived Exertion (Exercise)  13       Perceived Dyspnea (Exercise)  2        Symptoms  none       Duration  Continue with 45 min of aerobic exercise without signs/symptoms of physical distress.       Intensity  THRR unchanged         Progression   Progression  Continue to progress workloads to maintain intensity without signs/symptoms of physical distress.       Average METs  2.14         Resistance Training   Training Prescription  Yes       Weight  6 lb       Reps  10-15         Interval Training   Interval Training  No         Treadmill   MPH  1.1       Grade  0.5       Minutes  15       METs  1.92         NuStep  Level  3       SPM  80       Minutes  15       METs  2.5         REL-XR   Level  1.5       Speed  50       Minutes  15       METs  2          Exercise Comments: Exercise Comments    Row Name 04/05/18 1016 04/24/18 1051         Exercise Comments  First full day of exercise!  Patient was oriented to gym and equipment including functions, settings, policies, and procedures.  Patient's individual exercise prescription and treatment plan were reviewed.  All starting workloads were established based on the results of the 6 minute walk test done at initial orientation visit.  The plan for exercise progression was also introduced and progression will be customized based on patient's performance and goals.   Reviewed home exercise with pt today.  Pt plans to walk at home for exercise.  Reviewed THR, pulse, RPE, sign and symptoms, NTG use, and when to call 911 or MD.  Also discussed weather considerations and indoor options.  Pt voiced understanding.         Exercise Goals and Review: Exercise Goals    Row Name 04/01/18 1605             Exercise Goals   Increase Physical Activity  Yes       Intervention  Provide advice, education, support and counseling about physical activity/exercise needs.;Develop an individualized exercise prescription for aerobic and resistive training based on initial evaluation findings, risk  stratification, comorbidities and participant's personal goals.       Expected Outcomes  Short Term: Attend rehab on a regular basis to increase amount of physical activity.;Long Term: Add in home exercise to make exercise part of routine and to increase amount of physical activity.;Long Term: Exercising regularly at least 3-5 days a week.       Increase Strength and Stamina  Yes       Intervention  Provide advice, education, support and counseling about physical activity/exercise needs.;Develop an individualized exercise prescription for aerobic and resistive training based on initial evaluation findings, risk stratification, comorbidities and participant's personal goals.       Expected Outcomes  Short Term: Increase workloads from initial exercise prescription for resistance, speed, and METs.;Short Term: Perform resistance training exercises routinely during rehab and add in resistance training at home;Long Term: Improve cardiorespiratory fitness, muscular endurance and strength as measured by increased METs and functional capacity (6MWT)       Able to understand and use rate of perceived exertion (RPE) scale  Yes       Intervention  Provide education and explanation on how to use RPE scale       Expected Outcomes  Long Term:  Able to use RPE to guide intensity level when exercising independently;Short Term: Able to use RPE daily in rehab to express subjective intensity level       Able to understand and use Dyspnea scale  Yes       Intervention  Provide education and explanation on how to use Dyspnea scale       Expected Outcomes  Short Term: Able to use Dyspnea scale daily in rehab to express subjective sense of shortness of breath during exertion;Long Term: Able to use Dyspnea scale to guide  intensity level when exercising independently       Knowledge and understanding of Target Heart Rate Range (THRR)  Yes       Intervention  Provide education and explanation of THRR including how the numbers  were predicted and where they are located for reference       Expected Outcomes  Short Term: Able to state/look up THRR;Short Term: Able to use daily as guideline for intensity in rehab;Long Term: Able to use THRR to govern intensity when exercising independently       Able to check pulse independently  Yes       Intervention  Provide education and demonstration on how to check pulse in carotid and radial arteries.;Review the importance of being able to check your own pulse for safety during independent exercise       Expected Outcomes  Short Term: Able to explain why pulse checking is important during independent exercise;Long Term: Able to check pulse independently and accurately       Understanding of Exercise Prescription  Yes       Intervention  Provide education, explanation, and written materials on patient's individual exercise prescription       Expected Outcomes  Short Term: Able to explain program exercise prescription;Long Term: Able to explain home exercise prescription to exercise independently          Exercise Goals Re-Evaluation : Exercise Goals Re-Evaluation    Row Name 04/05/18 1016 04/11/18 0851 04/24/18 1051 04/25/18 0946 05/08/18 1231     Exercise Goal Re-Evaluation   Exercise Goals Review  Able to understand and use rate of perceived exertion (RPE) scale;Able to understand and use Dyspnea scale;Knowledge and understanding of Target Heart Rate Range (THRR);Understanding of Exercise Prescription  Increase Physical Activity;Increase Strength and Stamina;Able to understand and use rate of perceived exertion (RPE) scale;Able to understand and use Dyspnea scale  Increase Physical Activity;Able to understand and use rate of perceived exertion (RPE) scale;Knowledge and understanding of Target Heart Rate Range (THRR);Understanding of Exercise Prescription;Increase Strength and Stamina;Able to understand and use Dyspnea scale;Able to check pulse independently  Increase Physical  Activity;Increase Strength and Stamina;Able to understand and use rate of perceived exertion (RPE) scale;Able to understand and use Dyspnea scale;Knowledge and understanding of Target Heart Rate Range (THRR);Able to check pulse independently;Understanding of Exercise Prescription  Increase Physical Activity;Able to understand and use rate of perceived exertion (RPE) scale;Knowledge and understanding of Target Heart Rate Range (THRR);Understanding of Exercise Prescription;Increase Strength and Stamina;Able to understand and use Dyspnea scale   Comments  Reviewed RPE scale, THR and program prescription with pt today.  Pt voiced understanding and was given a copy of goals to take home.   Ernest Mclean has tolerated exercise well in first sessions.  Staff will monitor progress.   Reviewed home exercise with pt today.  Pt plans to walk at home for exercise.  Reviewed THR, pulse, RPE, sign and symptoms, NTG use, and when to call 911 or MD.  Also discussed weather considerations and indoor options.  Pt voiced understanding.  Ernest Mclean increased levels on NS and plasn to increase weight strength training next session.    Ernest Mclean increased resistance training weight up to 6 lbs. Continue to monitor progression.   Expected Outcomes  Short: Use RPE daily to regulate intensity. Long: Follow program prescription in THR.  Short - attend consistently Long - increase overall MET level  Short - increase walking time at home from 15-30 min Long - maintain exercise on his own  Short - increase to 6 lb weights for strength Long - increase MET level  Short- increase level on NS. Long- Increase MET level and make exercise a permanent commitment.   Ernest Mclean Name 05/21/18 1324 06/05/18 1320 06/20/18 0912         Exercise Goal Re-Evaluation   Exercise Goals Review  Increase Physical Activity;Increase Strength and Stamina;Able to understand and use rate of perceived exertion (RPE) scale;Able to understand and use Dyspnea scale;Knowledge and  understanding of Target Heart Rate Range (THRR);Understanding of Exercise Prescription  Increase Physical Activity;Increase Strength and Stamina;Able to understand and use rate of perceived exertion (RPE) scale;Able to understand and use Dyspnea scale;Understanding of Exercise Prescription  Increase Physical Activity;Increase Strength and Stamina;Able to understand and use rate of perceived exertion (RPE) scale;Able to understand and use Dyspnea scale;Knowledge and understanding of Target Heart Rate Range (THRR);Understanding of Exercise Prescription     Comments  Yaden tolerates exercise well and works in correct RPE range.  Staff will monitor progress.    Ernest Mclean has increased speed and grade on TM.  he is up to 6 lb weights for strength work.    Ernest Mclean consistently works at [employer].  He is able to complete 45 min continuous exercise.  he attends LW consistently.     Expected Outcomes  Short - increase level on TM Long - increase overall MET level  Short - continue to progress workloads Long - improve MET level  Short - continue to attend consistently Long - increase MET level        Discharge Exercise Prescription (Final Exercise Prescription Changes): Exercise Prescription Changes - 06/20/18 0900      Response to Exercise   Blood Pressure (Admit)  130/58    Blood Pressure (Exit)  122/58    Heart Rate (Admit)  65 bpm    Heart Rate (Exercise)  64 bpm    Heart Rate (Exit)  65 bpm    Oxygen Saturation (Admit)  97 %    Oxygen Saturation (Exercise)  94 %    Oxygen Saturation (Exit)  98 %    Rating of Perceived Exertion (Exercise)  13    Perceived Dyspnea (Exercise)  2    Symptoms  none    Duration  Continue with 45 min of aerobic exercise without signs/symptoms of physical distress.    Intensity  THRR unchanged      Progression   Progression  Continue to progress workloads to maintain intensity without signs/symptoms of physical distress.    Average METs  2.14      Resistance Training    Training Prescription  Yes    Weight  6 lb    Reps  10-15      Interval Training   Interval Training  No      Treadmill   MPH  1.1    Grade  0.5    Minutes  15    METs  1.92      NuStep   Level  3    SPM  80    Minutes  15    METs  2.5      REL-XR   Level  1.5    Speed  50    Minutes  15    METs  2       Nutrition:  Target Goals: Understanding of nutrition guidelines, daily intake of sodium <1579m, cholesterol <203m calories 30% from fat and 7% or less from saturated fats, daily to have 5 or more  servings of fruits and vegetables.  Biometrics: Pre Biometrics - 04/01/18 1604      Pre Biometrics   Height  _0  (1.854 m)    Weight  229 lb 8 oz (104.1 kg)    Waist Circumference  44.5 inches    Hip Circumference  43 inches    Waist to Hip Ratio  1.03 %    BMI (Calculated)  30.29        Nutrition Therapy Plan and Nutrition Goals: Nutrition Therapy & Goals - 04/10/18 1250      Nutrition Therapy   Diet  DM    Protein (specify units)  12-13oz    Fiber  35 grams    Whole Grain Foods  3 servings   eats whole grains regularly   Saturated Fats  15 max. grams    Fruits and Vegetables  6 servings/day   8 ideal. eats fruits and vegetables regularly   Sodium  2000 grams   per MD recommendation, follows 2g sodium diet strictly     Personal Nutrition Goals   Nutrition Goal  Experiment with breakfast options that are a combination of carbohydrate + protein rather than predominantly carbohydrate in order to prevent hyperglycemia prior to exercise    Personal Goal #2  Continue to work with your wife to keep sodium intake between 1500 and 2066m/day. Great job for finding lower sodium ingredient swaps and recipes!    Comments  He experiences dramatic BG changes within a matter of minutes, often dropping from the 300 range to 60 or even as low as 40. His wife tightly monitor his diet and keeps sodium intake lower than 20061mday. Pt typically eats 5 CHO servings per meal and  1-2 at snacks for a total of 17-18 CHO servings per day, which they state was recommended by endocrynologist. Wife weighs out foods and makes most meals from scratch using low sodium ingredients. They eat fruits and vegetables daily and monitor potassium intake in addition to sodium r/t CKD. They try to avoid foods with preservatives and try to buy meats from local farms. If they eat out, wife looks up nutrition information ahead of time. HgbA1c 7.7 "best it's been in years" 3 weeks ago per wife. Breakfast: shredded wheat cereal, oatmeal, oatmeal muffins, eggs / sausage occasionally. Snacks: NSA Klondike bar, fruit, unsalted nuts. If he experiences hypoglycemia he corrects with applejuice which is effective.      Intervention Plan   Intervention  Prescribe, educate and counsel regarding individualized specific dietary modifications aiming towards targeted core components such as weight, hypertension, lipid management, diabetes, heart failure and other comorbidities.    Expected Outcomes  Short Term Goal: A plan has been developed with personal nutrition goals set during dietitian appointment.;Long Term Goal: Adherence to prescribed nutrition plan.;Short Term Goal: Understand basic principles of dietary content, such as calories, fat, sodium, cholesterol and nutrients.       Nutrition Assessments: Nutrition Assessments - 04/01/18 1407      MEDFICTS Scores   Pre Score  24       Nutrition Goals Re-Evaluation: Nutrition Goals Re-Evaluation    [place] 04/10/18 1332 05/08/18 1031 06/12/18 1445         Goals   Nutrition Goal  Continue to work with your wife to keep sodium intake between 1500 and [id number]. Great job for finding lower sodium ingredient swaps and recipes!  Continue to work with your wife to keep sodium intake between 1500 and 2000m68mday; Experiment with breakfast  options that are a combination of carbohydrate and protein rather than predominantly carbohydrate  lose a few pounds      Comment  His wife monitors his diet tightly, weighing out foods, making recipes from scratch, reading labels, and looking up nutrition information at restaurants ahead of time  He and his wife have continued to monitor his diet closely and he reports that the regular exercise he is getting in the program has helped to improve BG control (IE prevented such large fluctuations). He has been eating breakfast options such as home made oatmeal muffins, oatmeal and occasionally eggs and toast. All options are low sodium and low sugar per his wife's cooking  Patient states that his wife watches his diet and has no questions.     Expected Outcome  Continue to limit sodium intake, keeping below 2052m/day when possible  He will keep up a regular exercise routine for general health and to continue to improve BG control. Maintain low sugar and sodium diet  Short: exercise and diet to lose weight. Long: maintain diet and weight loss independently.       Personal Goal #2 Re-Evaluation   Personal Goal #2  Experiment with breakfast options that are a combination of carbohydrate and protein rather than predominantly carbohydrate in order to prevent hyperglycemia prior to exercise  -  -        Nutrition Goals Discharge (Final Nutrition Goals Re-Evaluation): Nutrition Goals Re-Evaluation - 06/12/18 1445      Goals   Nutrition Goal  lose a few pounds    Comment  Patient states that his wife watches his diet and has no questions.    Expected Outcome  Short: exercise and diet to lose weight. Long: maintain diet and weight loss independently.       Psychosocial: Target Goals: Acknowledge presence or absence of significant depression and/or stress, maximize coping skills, provide positive support system. Participant is able to verbalize types and ability to use techniques and skills needed for reducing stress and depression.   Initial Review & Psychosocial Screening: Initial Psych Review & Screening - 04/01/18 1357       Initial Review   Current issues with  Current Stress Concerns    Source of Stress Concerns  Chronic Illness    Comments  Going to the doctors and going to funerals make his stressed out. His breathing and there are things that he cant do anymore.      Family Dynamics   Good Support System?  Yes    Comments  His wife of 547years is his support system.      Barriers   Psychosocial barriers to participate in program  There are no identifiable barriers or psychosocial needs.;The patient should benefit from training in stress management and relaxation.      Screening Interventions   Interventions  Encouraged to exercise;Program counselor consult;Provide feedback about the scores to participant;To provide support and resources with identified psychosocial needs    Expected Outcomes  Short Term goal: Utilizing psychosocial counselor, staff and physician to assist with identification of specific Stressors or current issues interfering with healing process. Setting desired goal for each stressor or current issue identified.;Long Term Goal: Stressors or current issues are controlled or eliminated.;Short Term goal: Identification and review with participant of any Quality of Life or Depression concerns found by scoring the questionnaire.;Long Term goal: The participant improves quality of Life and PHQ9 Scores as seen by post scores and/or verbalization of changes  Quality of Life Scores:  Scores of 19 and below usually indicate a poorer quality of life in these areas.  A difference of  2-3 points is a clinically meaningful difference.  A difference of 2-3 points in the total score of the Quality of Life Index has been associated with significant improvement in overall quality of life, self-image, physical symptoms, and general health in studies assessing change in quality of life.  PHQ-9: Recent Review Flowsheet Data    Depression screen [facility] 2/9 04/22/2018 04/01/2018 07/16/2017 03/19/2017  11/15/2016   Decreased Interest 0 0 0 0 0   Down, Depressed, Hopeless 0 0 0 0 0   PHQ - 2 Score 0 0 0 0 0   Altered sleeping 0 0 - - -   Tired, decreased energy 0 1 - - -   Change in appetite 0 0 - - -   Feeling bad or failure about yourself  0 0 - - -   Trouble concentrating 0 0 - - -   Moving slowly or fidgety/restless 0 0 - - -   Suicidal thoughts 0 0 - - -   PHQ-9 Score 0 1 - - -   Difficult doing work/chores Not difficult at all Not difficult at all - - -     Interpretation of Total Score  Total Score Depression Severity:  1-4 = Minimal depression, 5-9 = Mild depression, 10-14 = Moderate depression, 15-19 = Moderately severe depression, 20-27 = Severe depression   Psychosocial Evaluation and Intervention: Psychosocial Evaluation - 04/22/18 1118      Psychosocial Evaluation & Interventions   Interventions  Encouraged to exercise with the program and follow exercise prescription    Comments  Counselor follow up with Ernest Mclean) today much progress since coming into this program.  The blood flow to his right leg in particulary has improved significantly.  He has learned much from the education and particularly the dietician and nutritional aspects.  He continues to sleep well and manage his anxiety with medications and exercise.  He reports the staff here are extremely helpful and knowledgeable and he feels confident in their care.  Counselor commended Ernest Mclean on his progress made.  He has lost several pounds and that is quite an accomplishment with being the week after Thanksgiving!  Staff will continue to follow.    Expected Outcomes  Short:  Ernest Mclean will continue to exercise to improve his blood flow; decrease his anxiety and for his overall health.  Long:  Ernest Mclean will continue to make positive nutritional choices for his weight loss goal.     Continue Psychosocial Services   Follow up required by staff       Psychosocial Re-Evaluation: Psychosocial Re-Evaluation    [place] Name  05/29/18 1414 06/05/18 0912 06/12/18 1115         Psychosocial Re-Evaluation   Current issues with  Current Stress Concerns  Current Psychotropic Meds;Current Stress Concerns  -     Comments  Ernest Mclean states that there is nothing new to report with his mental health. He states that sometimes he is able to exercise well on the treadmill with his right calve and sometimes it really bothers him. He has a positive outlook and is doing very well in the program.  Counselor follow up with Ernest Mclean today reporting he is making some progress on his goals to lose some weight.  He also is enjoying the exercise program and is sleeping well.  His anxiety has improved as well with  only taking the medications every 3-4 days now.  Ernest Mclean reports he is coping better with stress now and he and his spouse are planning to join the gym and work out together upon completion of this program.  Counselor commended Ernest Mclean for his progress made and his commitment to positive self-care.  Staff will follow with him.    Counselor met briefly today with Ernest Mclean stating he continues to do well while in this program.  He is positive; sleeping well; and managing his stress better.  He appears to join the social aspect of this program and is a positive participant in groups.  counselor commended Ernest Mclean for all of his progress made while in this program.       Expected Outcomes  Short: Attend [facility] stress management education to decrease stress. Long: Maintain exercise Post [facility] to keep stress at a minimum.  Short:  Ernest Mclean will continue to exercise for his health and mental health - stress management and continued weight loss.  Long:  Ernest Mclean will develop a routine of healthy self-care to cope with life stressors.    Short:  Ernest Mclean will exercise consistently for his health and to manage his stress.  Long:  Ernest Mclean will continue to exercise and eat more healthfully for his long -term health needs.      Interventions  Encouraged to attend Pulmonary  Rehabilitation for the exercise  -  -     Continue Psychosocial Services   Follow up required by staff  -  -        Psychosocial Discharge (Final Psychosocial Re-Evaluation): Psychosocial Re-Evaluation - 06/12/18 1115      Psychosocial Re-Evaluation   Comments  Counselor met briefly today with Ernest Mclean stating he continues to do well while in this program.  He is positive; sleeping well; and managing his stress better.  He appears to join the social aspect of this program and is a positive participant in groups.  counselor commended Ernest Mclean for all of his progress made while in this program.      Expected Outcomes  Short:  Ernest Mclean will exercise consistently for his health and to manage his stress.  Long:  Ernest Mclean will continue to exercise and eat more healthfully for his long -term health needs.        Education: Education Goals: Education classes will be provided on a weekly basis, covering required topics. Participant will state understanding/return demonstration of topics presented.  Learning Barriers/Preferences: Learning Barriers/Preferences - 04/01/18 1402      Learning Barriers/Preferences   Learning Barriers  Sight   wears glasses   Learning Preferences  None       Education Topics:  Initial Evaluation Education: - Verbal, written and demonstration of respiratory meds, oximetry and breathing techniques. Instruction on use of nebulizers and MDIs and importance of monitoring MDI activations.   Pulmonary Rehab from 06/28/2018 in [facility] Cardiac and Pulmonary Rehab  Date  04/01/18  Educator  [facility]  Instruction Review Code  1- Verbalizes Understanding      General Nutrition Guidelines/Fats and Fiber: -Group instruction provided by verbal, written material, models and posters to present the general guidelines for heart healthy nutrition. Gives an explanation and review of dietary fats and fiber.   Controlling Sodium/Reading Food Labels: -Group verbal and written material supporting the  discussion of sodium use in heart healthy nutrition. Review and explanation with models, verbal and written materials for utilization of the food label.   Exercise Physiology & General Exercise Guidelines: - Group verbal and  written instruction with models to review the exercise physiology of the cardiovascular system and associated critical values. Provides general exercise guidelines with specific guidelines to those with heart or lung disease.    Pulmonary Rehab from 06/28/2018 in [facility] Cardiac and Pulmonary Rehab  Date  06/12/18  Educator  [facility]  Instruction Review Code  1- Verbalizes Understanding      Aerobic Exercise & Resistance Training: - Gives group verbal and written instruction on the various components of exercise. Focuses on aerobic and resistive training programs and the benefits of this training and how to safely progress through these programs.   Pulmonary Rehab from 06/28/2018 in [facility] Cardiac and Pulmonary Rehab  Date  06/14/18  Educator  [facility]  Instruction Review Code  1- Verbalizes Understanding      Flexibility, Balance, Mind/Body Relaxation: Provides group verbal/written instruction on the benefits of flexibility and balance training, including mind/body exercise modes such as yoga, pilates and tai chi.  Demonstration and skill practice provided.   Pulmonary Rehab from 06/28/2018 in [facility] Cardiac and Pulmonary Rehab  Date  06/19/18  Educator  [facility]  Instruction Review Code  1- Verbalizes Understanding      Stress and Anxiety: - Provides group verbal and written instruction about the health risks of elevated stress and causes of high stress.  Discuss the correlation between heart/lung disease and anxiety and treatment options. Review healthy ways to manage with stress and anxiety.   Pulmonary Rehab from 06/28/2018 in [facility] Cardiac and Pulmonary Rehab  Date  06/26/18  Educator  [facility]  Instruction Review Code  1- Verbalizes Understanding      Depression: - Provides group verbal  and written instruction on the correlation between heart/lung disease and depressed mood, treatment options, and the stigmas associated with seeking treatment.   Exercise & Equipment Safety: - Individual verbal instruction and demonstration of equipment use and safety with use of the equipment.   Pulmonary Rehab from 06/28/2018 in [facility] Cardiac and Pulmonary Rehab  Date  04/01/18  Educator  [facility]  Instruction Review Code  1- Verbalizes Understanding      Infection Prevention: - Provides verbal and written material to individual with discussion of infection control including proper hand washing and proper equipment cleaning during exercise session.   Pulmonary Rehab from 06/28/2018 in [facility] Cardiac and Pulmonary Rehab  Date  04/01/18  Educator  [facility]  Instruction Review Code  1- Verbalizes Understanding      Falls Prevention: - Provides verbal and written material to individual with discussion of falls prevention and safety.   Pulmonary Rehab from 06/28/2018 in [facility] Cardiac and Pulmonary Rehab  Date  04/01/18  Educator  [facility]  Instruction Review Code  1- Verbalizes Understanding      Diabetes: - Individual verbal and written instruction to review signs/symptoms of diabetes, desired ranges of glucose level fasting, after meals and with exercise. Advice that pre and post exercise glucose checks will be done for 3 sessions at entry of program.   Chronic Lung Diseases: - Group verbal and written instruction to review updates, respiratory medications, advancements in procedures and treatments. Discuss use of supplemental oxygen including available portable oxygen systems, continuous and intermittent flow rates, concentrators, personal use and safety guidelines. Review proper use of inhaler and spacers. Provide informative websites for self-education.    Pulmonary Rehab from 06/28/2018 in [facility] Cardiac and Pulmonary Rehab  Date  06/28/18  Educator  [facility]  Instruction Review Code  1- Verbalizes Understanding  Energy Conservation: - Provide group verbal and written instruction for methods to conserve energy, plan and organize activities. Instruct on pacing techniques, use of adaptive equipment and posture/positioning to relieve shortness of breath.   Triggers and Exacerbations: - Group verbal and written instruction to review types of environmental triggers and ways to prevent exacerbations. Discuss weather changes, air quality and the benefits of nasal washing. Review warning signs and symptoms to help prevent infections. Discuss techniques for effective airway clearance, coughing, and vibrations.   Pulmonary Rehab from 06/28/2018 in [facility] Cardiac and Pulmonary Rehab  Date  05/10/18  Educator  [facility]  Instruction Review Code  1- Verbalizes Understanding      AED/CPR: - Group verbal and written instruction with the use of models to demonstrate the basic use of the AED with the basic ABC's of resuscitation.   Anatomy and Physiology of the Lungs: - Group verbal and written instruction with the use of models to provide basic lung anatomy and physiology related to function, structure and complications of lung disease.   Pulmonary Rehab from 06/28/2018 in [facility] Cardiac and Pulmonary Rehab  Date  04/05/18  Educator  [facility]  Instruction Review Code  1- Verbalizes Understanding      Anatomy & Physiology of the Heart: - Group verbal and written instruction and models provide basic cardiac anatomy and physiology, with the coronary electrical and arterial systems. Review of Valvular disease and Heart Failure   Pulmonary Rehab from 06/28/2018 in [facility] Cardiac and Pulmonary Rehab  Date  05/24/18  Educator  [facility]  Instruction Review Code  1- Verbalizes Understanding      Cardiac Medications: - Group verbal and written instruction to review commonly prescribed medications for heart disease. Reviews the medication, class of the drug, and side effects.   Pulmonary Rehab from 06/28/2018 in [facility] Cardiac and Pulmonary  Rehab  Date  05/31/18  Educator  [facility]  Instruction Review Code  1- Verbalizes Understanding      Know Your Numbers and Risk Factors: -Group verbal and written instruction about important numbers in your health.  Discussion of what are risk factors and how they play a role in the disease process.  Review of Cholesterol, Blood Pressure, Diabetes, and BMI and the role they play in your overall health.   Pulmonary Rehab from 06/28/2018 in [facility] Cardiac and Pulmonary Rehab  Date  05/08/18  Educator  [facility]  Instruction Review Code  1- Verbalizes Understanding      Sleep Hygiene: -Provides group verbal and written instruction about how sleep can affect your health.  Define sleep hygiene, discuss sleep cycles and impact of sleep habits. Review good sleep hygiene tips.    Pulmonary Rehab from 06/28/2018 in [facility] Cardiac and Pulmonary Rehab  Date  05/29/18  Educator  [facility]  Instruction Review Code  1- Verbalizes Understanding      Other: -Provides group and verbal instruction on various topics (see comments)    Knowledge Questionnaire Score: Knowledge Questionnaire Score - 04/01/18 1402      Knowledge Questionnaire Score   Pre Score  15/18   reviewed with patient       Core Components/Risk Factors/Patient Goals at Admission: Personal Goals and Risk Factors at Admission - 04/01/18 1404      Core Components/Risk Factors/Patient Goals on Admission    Weight Management  Yes;Weight Loss    Intervention  Weight Management: Develop a combined nutrition and exercise program designed to reach desired caloric intake, while maintaining appropriate intake of nutrient and fiber,  sodium and fats, and appropriate energy expenditure required for the weight goal.;Weight Management: Provide education and appropriate resources to help participant work on and attain dietary goals.;Weight Management/Obesity: Establish reasonable short term and long term weight goals.    Admit Weight  229 lb 8 oz (104.1 kg)    Goal  Weight: Short Term  225 lb (102.1 kg)    Goal Weight: Long Term  215 lb (97.5 kg)    Expected Outcomes  Short Term: Continue to assess and modify interventions until short term weight is achieved;Long Term: Adherence to nutrition and physical activity/exercise program aimed toward attainment of established weight goal;Weight Loss: Understanding of general recommendations for a balanced deficit meal plan, which promotes 1-2 lb weight loss per week and includes a negative energy balance of [phone] kcal/d;Understanding recommendations for meals to include 15-35% energy as protein, 25-35% energy from fat, 35-60% energy from carbohydrates, less than 27m of dietary cholesterol, 20-35 gm of total fiber daily;Understanding of distribution of calorie intake throughout the day with the consumption of 4-5 meals/snacks    Improve shortness of breath with ADL's  Yes    Intervention  Provide education, individualized exercise plan and daily activity instruction to help decrease symptoms of SOB with activities of daily living.    Expected Outcomes  Short Term: Improve cardiorespiratory fitness to achieve a reduction of symptoms when performing ADLs;Long Term: Be able to perform more ADLs without symptoms or delay the onset of symptoms    Diabetes  Yes    Intervention  Provide education about signs/symptoms and action to take for hypo/hyperglycemia.;Provide education about proper nutrition, including hydration, and aerobic/resistive exercise prescription along with prescribed medications to achieve blood glucose in normal ranges: Fasting glucose 65-99 mg/dL    Expected Outcomes  Short Term: Participant verbalizes understanding of the signs/symptoms and immediate care of hyper/hypoglycemia, proper foot care and importance of medication, aerobic/resistive exercise and nutrition plan for blood glucose control.;Long Term: Attainment of HbA1C < 7%.    Heart Failure  Yes    Intervention  Provide a combined exercise and  nutrition program that is supplemented with education, support and counseling about heart failure. Directed toward relieving symptoms such as shortness of breath, decreased exercise tolerance, and extremity edema.    Expected Outcomes  Improve functional capacity of life;Short term: Attendance in program 2-3 days a week with increased exercise capacity. Reported lower sodium intake. Reported increased fruit and vegetable intake. Reports medication compliance.;Short term: Daily weights obtained and reported for increase. Utilizing diuretic protocols set by physician.;Long term: Adoption of self-care skills and reduction of barriers for early signs and symptoms recognition and intervention leading to self-care maintenance.    Hypertension  Yes    Intervention  Provide education on lifestyle modifcations including regular physical activity/exercise, weight management, moderate sodium restriction and increased consumption of fresh fruit, vegetables, and low fat dairy, alcohol moderation, and smoking cessation.;Monitor prescription use compliance.    Expected Outcomes  Short Term: Continued assessment and intervention until BP is < 140/951mHG in hypertensive participants. < 130/8052mG in hypertensive participants with diabetes, heart failure or chronic kidney disease.;Long Term: Maintenance of blood pressure at goal levels.    Lipids  Yes    Intervention  Provide education and support for participant on nutrition & aerobic/resistive exercise along with prescribed medications to achieve LDL <5m19mDL >40mg55m Expected Outcomes  Short Term: Participant states understanding of desired cholesterol values and is compliant with medications prescribed. Participant is following exercise prescription  and nutrition guidelines.;Long Term: Cholesterol controlled with medications as prescribed, with individualized exercise RX and with personalized nutrition plan. Value goals: LDL < 59m, HDL > 40 mg.       Core  Components/Risk Factors/Patient Goals Review:  Goals and Risk Factor Review    Row Name 04/15/18 1458 05/29/18 1417 06/17/18 1632         Core Components/Risk Factors/Patient Goals Review   Personal Goals Review  Lipids;Heart Failure;Improve shortness of breath with ADL's;Weight Management/Obesity;Diabetes  Lipids;Heart Failure;Improve shortness of breath with ADL's;Weight Management/Obesity;Diabetes  Lipids;Heart Failure;Improve shortness of breath with ADL's;Weight Management/Obesity;Diabetes     Review  LSakethstates that his blood flow is not good to his right leg. Sometimes he has to take a break on the treadmill because it get painful. He is checking his sugar at home and here in the program. When he feels like he is getting low sometimes he likes to be checked. He brings apple juice incase his sugar is low after class. We will check his machine against ours if he would like to use his.  Patient has been maintaing his weight and his diabetes is under control. He still has issues with his blood flow in his right calve but is working hard to better his health. He has been getting more stamina to do his ADL's.   LDesmanhas been doing well in thr program and will continue to exercise after [facility]. He wants to be able to walk with his vascular issues in his right calve as much as he can. He sees his doctor regularly and know what he has to do.     Expected Outcomes  Short: Attend [facility] regularly to improve shortness of breath with ADL's. Long: maintain independence with ADL's   Short: maintain attendance in [place] Short: graduate [facility] and continue exercising.  Short: graduate [place] Long: Maintain exercise and ADLs post [facility].        Core Components/Risk Factors/Patient Goals at Discharge (Final Review):  Goals and Risk Factor Review - 06/17/18 1632      Core Components/Risk Factors/Patient Goals Review   Personal Goals Review  Lipids;Heart Failure;Improve shortness of breath  with ADL's;Weight Management/Obesity;Diabetes    Review  LCohanhas been doing well in thr program and will continue to exercise after [facility]. He wants to be able to walk with his vascular issues in his right calve as much as he can. He sees his doctor regularly and know what he has to do.    Expected Outcomes  Short: graduate [facility] Long: Maintain exercise and ADLs post [facility].       ITP Comments: ITP Comments    Row Name 04/01/18 1345 04/08/18 0836 05/06/18 0829 06/03/18 0835 07/01/18 0825   ITP Comments  Medical Evaluation completed. Chart sent for review and changes to Dr. MEmily FilbertDirector of [place] Diagnosis can be found in [facility] encounter 03/21/2018  30 day review completed. ITP sent to Dr. MEmily FilbertDirector of [place] Continue with ITP unless changes are made by physician.  30 day review completed. ITP sent to Dr. MEmily FilbertDirector of [place] Continue with ITP unless changes are made by physician.  30 day review completed. ITP sent to Dr. MEmily FilbertDirector of [place] Continue with ITP unless changes are made by physician.  30 day review completed. ITP sent to Dr. MEmily FilbertDirector of [place] Continue with ITP unless changes are made by physician.      Comments: 30 day review.

## 2018-07-03 DIAGNOSIS — J449 Chronic obstructive pulmonary disease, unspecified: Secondary | ICD-10-CM

## 2018-07-03 NOTE — Progress Notes (Signed)
Daily Session Note  Patient Details  Name: Ernest Mclean MRN: [id number] Date of Birth: [birth year] Referring Provider:     Pulmonary Rehab from 04/01/2018 in [facility] Cardiac and Pulmonary Rehab  Referring Provider  Kasa      Encounter Date: 07/03/2018  Check In: Session Check In - 07/03/18 1203      Check-In   Supervising physician immediately available to respond to emergencies  [facility] immediately available ER MD    Physician(s)  Dr. Mariea Clonts and [facility]    Location  [facility]-Cardiac & Pulmonary Rehab    Staff Present  Justin Mend Lorre Nick, [place], RCEP, CCRP, Exercise Physiologist;Meredith Sherryll Burger, RN BSN    Medication changes reported      No    Fall or balance concerns reported     No    Warm-up and Cool-down  Performed as group-led Higher education careers adviser Performed  Yes    VAD Patient?  No    PAD/SET Patient?  No      Pain Assessment   Currently in Pain?  No/denies          Social History   Tobacco Use  Smoking Status Former Smoker  . Packs/day: 1.00  . Years: 30.00  . Pack years: 30.00  . Types: Cigarettes  . Last attempt to quit: 12/14/2001  . Years since quitting: 16.5  Smokeless Tobacco Never Used  Tobacco Comment   quit 12/14/2001    Goals Met:  Independence with exercise equipment Exercise tolerated well No report of cardiac concerns or symptoms Strength training completed today  Goals Unmet:  Not Applicable  Comments: Pt able to follow exercise prescription today without complaint.  Will continue to monitor for progression.    Dr. Emily Filbert is Medical Director for [place] and [facility].

## 2018-07-05 ENCOUNTER — Encounter: Payer: Medicare Other | Admitting: *Deleted

## 2018-07-05 VITALS — Ht 73.0 in | Wt 228.0 lb

## 2018-07-05 DIAGNOSIS — J449 Chronic obstructive pulmonary disease, unspecified: Secondary | ICD-10-CM

## 2018-07-05 NOTE — Progress Notes (Signed)
Daily Session Note  Patient Details  Name: Ernest Mclean MRN: [id number] Date of Birth: 1940-07-30 Referring Provider:     Pulmonary Rehab from 04/01/2018 in [facility] Cardiac and Pulmonary Rehab  Referring Provider  Kasa      Encounter Date: 07/05/2018  Check In: Session Check In - 07/05/18 1016      Check-In   Supervising physician immediately available to respond to emergencies  [facility] immediately available ER MD    Physician(s)  Dr. Cherylann Banas and Clearnce Hasten    Location  [facility]-Cardiac & Pulmonary Rehab    Staff Present  Renita Papa, RN BSN;Jeanna Durrell BS, Exercise Physiologist;Joseph Tessie Fass RCP,RRT,BSRT    Medication changes reported      No    Fall or balance concerns reported     No    Tobacco Cessation  No Change    Warm-up and Cool-down  Performed as group-led instruction    Resistance Training Performed  Yes    VAD Patient?  No    PAD/SET Patient?  No      Pain Assessment   Currently in Pain?  No/denies          Social History   Tobacco Use  Smoking Status Former Smoker  . Packs/day: 1.00  . Years: 30.00  . Pack years: 30.00  . Types: Cigarettes  . Last attempt to quit: 12/14/2001  . Years since quitting: 16.5  Smokeless Tobacco Never Used  Tobacco Comment   quit 12/14/2001    Goals Met:  Proper associated with RPD/PD & O2 Sat Independence with exercise equipment Exercise tolerated well No report of cardiac concerns or symptoms Strength training completed today  Goals Unmet:  Not Applicable  Comments: Pt able to follow exercise prescription today without complaint.  Will continue to monitor for progression. 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 He experiences this regularly.    Resting HR  61 bpm  62 bpm    Resting BP  148/64  140/80    Resting Oxygen Saturation   95 %  93 %    Exercise Oxygen Saturation  during 6 min walk  92 %  90 %    Max Ex. HR  76 bpm  60 bpm    Max Ex. BP  156/60  138/60    2 Minute Post BP  142/64  126/70      Interval HR   1 Minute HR  59  -    2 Minute HR  58  -    3 Minute HR  61  -    5 Minute HR  65  -    6  Minute HR  76  -    2 Minute Post HR  59  -    Interval Heart Rate?  Yes  -      Interval Oxygen   Interval Oxygen?  Yes  -    Baseline Oxygen Saturation %  95 %  -    1 Minute Oxygen Saturation %  93 %  -    1 Minute Liters of Oxygen  0 L  -    2 Minute Oxygen Saturation %  94 %  -    2 Minute Liters of Oxygen  0 L  -    3 Minute Oxygen Saturation %  93 %  -    3 Minute Liters of Oxygen  0 L  -    4 Minute Liters of Oxygen  0 L  -    5 Minute Oxygen Saturation %  92 %  -    5 Minute Liters of Oxygen  0 L  -    6 Minute Oxygen Saturation %  94 %  -    6 Minute Liters of Oxygen  0 L  -    2 Minute Post Oxygen Saturation %  96 %  -    2 Minute Post Liters of Oxygen  0 L  -         Dr. Emily Filbert is Medical Director for [place] and [facility].

## 2018-07-08 ENCOUNTER — Encounter: Payer: Medicare Other | Admitting: *Deleted

## 2018-07-08 DIAGNOSIS — J449 Chronic obstructive pulmonary disease, unspecified: Secondary | ICD-10-CM | POA: Diagnosis not present

## 2018-07-08 NOTE — Progress Notes (Signed)
Daily Session Note  Patient Details  Name: Ernest Mclean MRN: [id number] Date of Birth: July 08, 1940 Referring Provider:     Pulmonary Rehab from 04/01/2018 in [facility] Cardiac and Pulmonary Rehab  Referring Provider  Kasa      Encounter Date: 07/08/2018  Check In: Session Check In - 07/08/18 1025      Check-In   Supervising physician immediately available to respond to emergencies  [facility] immediately available ER MD    Physician(s)  Dr. Cinda Quest and Dr. Alfred Levins    Location  [facility]-Cardiac & Pulmonary Rehab    Staff Present  Earlean Shawl, BS, ACSM CEP, Exercise Physiologist;Joseph [facility], [place], ACSM CEP, Exercise Physiologist    Medication changes reported      No    Fall or balance concerns reported     No    Tobacco Cessation  No Change    Warm-up and Cool-down  Performed as group-led instruction    Resistance Training Performed  Yes    VAD Patient?  No    PAD/SET Patient?  No      Pain Assessment   Currently in Pain?  No/denies    Multiple Pain Sites  No          Social History   Tobacco Use  Smoking Status Former Smoker  . Packs/day: 1.00  . Years: 30.00  . Pack years: 30.00  . Types: Cigarettes  . Last attempt to quit: 12/14/2001  . Years since quitting: 16.5  Smokeless Tobacco Never Used  Tobacco Comment   quit 12/14/2001    Goals Met:  Proper associated with RPD/PD & O2 Sat Independence with exercise equipment Exercise tolerated well No report of cardiac concerns or symptoms Strength training completed today  Goals Unmet:  Not Applicable  Comments: Pt able to follow exercise prescription today without complaint.  Will continue to monitor for progression.    Dr. Emily Filbert is Medical Director for [place] and [facility].

## 2018-07-09 ENCOUNTER — Encounter (INDEPENDENT_AMBULATORY_CARE_PROVIDER_SITE_OTHER): Payer: Self-pay | Admitting: Vascular Surgery

## 2018-07-09 ENCOUNTER — Ambulatory Visit (INDEPENDENT_AMBULATORY_CARE_PROVIDER_SITE_OTHER): Payer: Medicare Other | Admitting: Vascular Surgery

## 2018-07-09 ENCOUNTER — Other Ambulatory Visit: Payer: Self-pay

## 2018-07-09 ENCOUNTER — Ambulatory Visit (INDEPENDENT_AMBULATORY_CARE_PROVIDER_SITE_OTHER): Payer: Medicare Other

## 2018-07-09 VITALS — BP 162/51 | HR 60 | Resp 12 | Ht 72.0 in | Wt 223.0 lb

## 2018-07-09 DIAGNOSIS — I129 Hypertensive chronic kidney disease with stage 1 through stage 4 chronic kidney disease, or unspecified chronic kidney disease: Secondary | ICD-10-CM | POA: Diagnosis not present

## 2018-07-09 DIAGNOSIS — I4892 Unspecified atrial flutter: Secondary | ICD-10-CM

## 2018-07-09 DIAGNOSIS — N183 Chronic kidney disease, stage 3 unspecified: Secondary | ICD-10-CM

## 2018-07-09 DIAGNOSIS — E1122 Type 2 diabetes mellitus with diabetic chronic kidney disease: Secondary | ICD-10-CM

## 2018-07-09 DIAGNOSIS — Z794 Long term (current) use of insulin: Secondary | ICD-10-CM

## 2018-07-09 DIAGNOSIS — I10 Essential (primary) hypertension: Secondary | ICD-10-CM

## 2018-07-09 DIAGNOSIS — I70213 Atherosclerosis of native arteries of extremities with intermittent claudication, bilateral legs: Secondary | ICD-10-CM | POA: Diagnosis not present

## 2018-07-09 DIAGNOSIS — I5032 Chronic diastolic (congestive) heart failure: Secondary | ICD-10-CM

## 2018-07-09 DIAGNOSIS — Z87891 Personal history of nicotine dependence: Secondary | ICD-10-CM

## 2018-07-09 NOTE — Progress Notes (Signed)
MRN : [id number]  Ernest Mclean is a 78 y.o. ([birth year]) male who presents with chief complaint of  Chief Complaint  Patient presents with  . Follow-up  .  History of Present Illness: Patient returns today in follow up of his PAD.  He is doing well.  He denies any new ulceration, infection, or other limb threatening issue.  He does have some claudication but his distances of walking have improved.  He has been doing pulmonary rehab and exercising 3 days a week.  His ABIs today are up to 0.66 on the right and 1.1 on the left.  Last time, they were 0.57 on the right and 0.99 on the left.  This is despite a known aortoiliac occlusion.  Current Outpatient Medications  Medication Sig Dispense Refill  . albuterol (PROVENTIL HFA;VENTOLIN HFA) 108 (90 Base) MCG/ACT inhaler Inhale 2 puffs into the lungs every 4 (four) hours as needed for wheezing or shortness of breath. 25.5 Inhaler 1  . amLODipine (NORVASC) 5 MG tablet Take 5 mg by mouth daily.     Marland Kitchen ammonium lactate (AMLACTIN) 12 % cream Apply 1 g topically as needed for dry skin.    Marland Kitchen apixaban (ELIQUIS) 5 MG TABS tablet Take 5 mg by mouth 2 (two) times daily.    Marland Kitchen aspirin EC 81 MG tablet Take 81 mg by mouth daily.    . citalopram (CELEXA) 20 MG tablet Take 20 mg by mouth at bedtime.   5  . CRANBERRY PO Take 820 mg by mouth daily.    . cyanocobalamin 500 MCG tablet Take 500 mcg by mouth daily.    Marland Kitchen desonide (DESOWEN) 0.05 % cream Apply topically.    . diazepam (VALIUM) 5 MG tablet Take 5 mg by mouth every 12 (twelve) hours as needed for anxiety.     . ergocalciferol (VITAMIN D2) 50000 units capsule Take 50,000 Units by mouth every 30 (thirty) days.    . furosemide (LASIX) 20 MG tablet Take 20 mg by mouth daily. Take one tablet daily.  Can take extra tablet in afternoon if increase weight gain of 3 pounds or increased swelling.    . insulin glargine (LANTUS) 100 UNIT/ML injection Inject 70 Units into the skin at bedtime.    . insulin lispro  (HUMALOG) 100 UNIT/ML injection Inject 15-25 Units into the skin 3 (three) times daily before meals. 15 units before breakfast, 25 units before lunch, and 25 units before supper plus additional units for sliding scale    . ketoconazole (NIZORAL) 2 % shampoo Apply 1 application topically 2 (two) times a week.    Marland Kitchen lisinopril (PRINIVIL,ZESTRIL) 5 MG tablet Take 5 mg by mouth daily.    . metFORMIN (GLUCOPHAGE) 500 MG tablet Take 1,000 mg by mouth every evening.     . metoprolol (LOPRESSOR) 100 MG tablet Take 100 mg by mouth 2 (two) times daily.  0  . OMEGA-3 FATTY ACIDS-VITAMIN E PO Take 1 capsule by mouth daily.    . pravastatin (PRAVACHOL) 20 MG tablet Take 20 mg by mouth at bedtime.  3  . tiotropium (SPIRIVA) 18 MCG inhalation capsule Place 18 mcg into inhaler and inhale daily.    . vitamin C (ASCORBIC ACID) 500 MG tablet Take 500 mg by mouth daily.    Grant Ruts INHUB 500-50 MCG/DOSE AEPB TAKE 1 PUFF BY MOUTH TWICE A DAY 180 each 1   No current facility-administered medications for this visit.     Past Medical History:  Diagnosis  Date  . Atrial fibrillation ([place])   . Basal cell carcinoma 06/2017   Left Ear  . CAD (coronary artery disease)   . CKD (chronic kidney disease)   . Congestive heart failure ([place])   . COPD (chronic obstructive pulmonary disease) ([place])   . Diabetes ([place])   . Hypertension   . OSA on CPAP   . 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 CLAVICLE SURGERY    . heart bypass    . HERNIA REPAIR    . MOHS SURGERY  07/10/2017   Head   Family History  Problem Relation Age of Onset  . Stroke Unknown   . Diabetes Unknown   . Breast cancer Unknown   . Colon cancer Unknown   No bleeding disorders, clotting disorders, autoimmune diseases, or aneurysms  Social History       Social History  Substance Use Topics  . Smoking status: Former Smoker    Packs/day: 1.00    Years: 30.00    Types:  Cigarettes  . Smokeless tobacco: Never Used     Comment: quit 12/14/2001  . Alcohol use No  Married       Allergies  Allergen Reactions  . Hydralazine Other (See Comments)    tongue swollen and couldn't wake up ---- not positive it was this or a mix of this with something else or high sugar  . Penicillins Other (See Comments)    Passed out (at 78 yrs old) Has patient had a PCN reaction causing immediate rash, facial/tongue/throat swelling, SOB or lightheadedness with hypotension: No Has patient had a PCN reaction causing severe rash involving mucus membranes or skin necrosis: No Has patient had a PCN reaction that required hospitalization No Has patient had a PCN reaction occurring within the last 10 years: No If all of the above answers are "NO", then may proceed with Cephalosporin use.   . Codeine Hives, Rash and Swelling      REVIEW OF SYSTEMS(Negative unless checked)  Constitutional: [] ?Weight loss[] ?Fever[] ?Chills Cardiac:[] ?Chest pain[] ?Chest pressure[x] ?Palpitations [] ?Shortness of breath when laying flat [] ?Shortness of breath at rest [x] ?Shortness of breath with exertion. Vascular: [x] ?Pain in legs with walking[] ?Pain in legsat rest[] ?Pain in legs when laying flat [x] ?Claudication [] ?Pain in feet when walking [] ?Pain in feet at rest [] ?Pain in feet when laying flat [] ?History of DVT [] ?Phlebitis [x] ?Swelling in legs [] ?Varicose veins [x] ?Non-healing ulcers Pulmonary: [x] ?Uses home oxygen [] ?Productive cough[] ?Hemoptysis [] ?Wheeze [x] ?COPD [] ?Asthma Neurologic: [] ?Dizziness [] ?Blackouts [] ?Seizures [] ?History of stroke [] ?History of TIA[] ?Aphasia [] ?Temporary blindness[] ?Dysphagia [] ?Weaknessor numbness in arms [x] ?Weakness or numbnessin legs Musculoskeletal: [x] ?Arthritis [] ?Joint swelling [] ?Joint pain [] ?Low back pain Hematologic:[] ?Easy bruising[] ?Easy bleeding  [] ?Hypercoagulable state [] ?Anemic [] ?Hepatitis Gastrointestinal:[] ?Blood in stool[] ?Vomiting blood[] ?Gastroesophageal reflux/heartburn[] ?Abdominal pain Genitourinary: [x] ?Chronic kidney disease [] ?Difficulturination [] ?Frequenturination [] ?Burning with urination[] ?Hematuria Skin: [] ?Rashes [x] ?Ulcers [x] ?Wounds Psychological: [] ?History of anxiety[] ?History of major depression.    Physical Examination  BP (!) 162/51 (BP Location: Left Arm, Patient Position: Sitting, Cuff Size: Large)   Pulse 60   Resp 12   Ht 6' (1.829 m)   Wt 223 lb (101.2 kg)   BMI 30.24 kg/m  Gen:  WD/WN, NAD Head: [place]/AT, No temporalis wasting. Ear/Nose/Throat: Hearing grossly intact, nares w/o erythema or drainage Eyes: Conjunctiva clear. Sclera non-icteric Neck: Supple.  Trachea midline Pulmonary:  Good air movement, no use of accessory muscles.  Cardiac: RRR, no JVD Vascular:  Vessel Right Left  Radial Palpable Palpable  PT Not Palpable 1+ Palpable  DP 1+ Palpable 1+ Palpable    Musculoskeletal: M/S 5/5 throughout.  No deformity or atrophy. Mild LE edema. Neurologic: Sensation grossly intact in extremities.  Symmetrical.  Speech is fluent.  Psychiatric: Judgment intact, Mood & affect appropriate for pt's clinical situation. Dermatologic: No rashes or ulcers noted.  No cellulitis or open wounds.       Labs Recent Results (from the past 2160 hour(s))  Glucose, capillary     Status: Abnormal   Collection Time: 04/12/18 10:03 AM  Result Value Ref Range   Glucose-Capillary 104 (H) 70 - 99 mg/dL  Glucose, capillary     Status: None   Collection Time: 04/12/18 11:31 AM  Result Value Ref Range   Glucose-Capillary 74 70 - 99 mg/dL  Glucose, capillary     Status: None   Collection Time: 04/15/18 11:14 AM  Result Value Ref Range   Glucose-Capillary 76 70 - 99 mg/dL    Radiology No results found.  Assessment/Plan Atrial flutter  (HCC) On anticoagulation  CHF (congestive heart failure) (HCC) Has chronic reduced ejection fraction of about 35-40% per the wife. Certainly would be high risk for open surgical therapy.  HTN (hypertension) blood pressure control important in reducing the progression of atherosclerotic disease. On appropriate oral medications.   Type 2 diabetes mellitus (HCC) blood glucose control important in reducing the progression of atherosclerotic disease. Also, involved in wound healing. On appropriate medications.  Atherosclerosis of native arteries of extremity with intermittent claudication (HCC) His ABIs today are up to 0.66 on the right and 1.1 on the left.  Last time, they were 0.57 on the right and 0.99 on the left.  This is despite a known aortoiliac occlusion. At this point, he is doing fairly well.  I will stretch out his follow-ups to annually.  He will contact our office with any problems in the interim.    Leotis Pain, MD  07/09/2018 2:51 PM    This note was created with Dragon medical transcription system.  Any errors from dictation are purely unintentional

## 2018-07-09 NOTE — Patient Instructions (Signed)
Peripheral Vascular Disease  Peripheral vascular disease (PVD) is a disease of the blood vessels that are not part of your heart and brain. A simple term for PVD is poor circulation. In most cases, PVD narrows the blood vessels that carry blood from your heart to the rest of your body. This can reduce the supply of blood to your arms, legs, and internal organs, like your stomach or kidneys. However, PVD most often affects a person's lower legs and feet. Without treatment, PVD tends to get worse. PVD can also lead to acute ischemic limb. This is when an arm or leg suddenly cannot get enough blood. This is a medical emergency. Follow these instructions at home: Lifestyle  Do not use any products that contain nicotine or tobacco, such as cigarettes and e-cigarettes. If you need help quitting, ask your doctor.  Lose weight if you are overweight. Or, stay at a healthy weight as told by your doctor.  Eat a diet that is low in fat and cholesterol. If you need help, ask your doctor.  Exercise regularly. Ask your doctor for activities that are right for you. General instructions  Take over-the-counter and prescription medicines only as told by your doctor.  Take good care of your feet: ? Wear comfortable shoes that fit well. ? Check your feet often for any cuts or sores.  Keep all follow-up visits as told by your doctor This is important. Contact a doctor if:  You have cramps in your legs when you walk.  You have leg pain when you are at rest.  You have coldness in a leg or foot.  Your skin changes.  You are unable to get or have an erection (erectile dysfunction).  You have cuts or sores on your feet that do not heal. Get help right away if:  Your arm or leg turns cold, numb, and blue.  Your arms or legs become red, warm, swollen, painful, or numb.  You have chest pain.  You have trouble breathing.  You suddenly have weakness in your face, arm, or leg.  You become very  confused or you cannot speak.  You suddenly have a very bad headache.  You suddenly cannot see. Summary  Peripheral vascular disease (PVD) is a disease of the blood vessels.  A simple term for PVD is poor circulation. Without treatment, PVD tends to get worse.  Treatment may include exercise, low fat and low cholesterol diet, and quitting smoking. This information is not intended to replace advice given to you by your health care provider. Make sure you discuss any questions you have with your health care provider. Document Released: 08/02/2009 Document Revised: 06/15/2016 Document Reviewed: 06/15/2016 Elsevier Interactive Patient Education  2019 [employer]  

## 2018-07-09 NOTE — Assessment & Plan Note (Signed)
His ABIs today are up to 0.66 on the right and 1.1 on the left.  Last time, they were 0.57 on the right and 0.99 on the left.  This is despite a known aortoiliac occlusion. At this point, he is doing fairly well.  I will stretch out his follow-ups to annually.  He will contact our office with any problems in the interim.

## 2018-07-10 DIAGNOSIS — J449 Chronic obstructive pulmonary disease, unspecified: Secondary | ICD-10-CM

## 2018-07-10 NOTE — Progress Notes (Signed)
Daily Session Note  Patient Details  Name: BRADDEN TADROS MRN: [id number] Date of Birth: [birth year] Referring Provider:     Pulmonary Rehab from 04/01/2018 in [facility]  Referring Provider  [place]      Encounter Date: 07/10/2018  Check In: Session Check In - 07/10/18 [place]      Check-In   Supervising physician immediately available to respond to emergencies  [facility] immediately available ER MD    Physician(s)  Joni Fears and Jimmye Norman    Location  [facility]-Cardiac & Pul[facility] Re[facility]    Staff Present  Alberteen Sam, MA, RCEP, CCRP, Exercise Physiologist;Joseph Foy [place], [place], ACSM CEP, Exercise Physiologist    Medication changes reported      No    Fall or balance concerns reported     No    Warm-up and Cool-down  Performed as group-led instruction    Resistance Training Performed  Yes    VAD Patient?  No    PAD/SET Patient?  No      Pain Assessment   Currently in Pain?  No/denies    Multiple Pain Sites  No          Social History   Tobacco Use  Smoking Status Former Smoker  . Packs/day: 1.00  . Years: 30.00  . Pack years: 30.00  . Types: Cigarettes  . Last attempt to quit: 12/14/2001  . Years since quitting: 16.5  Smokeless Tobacco Never Used  Tobacco Comment   quit 12/14/2001    Goals Met:  Proper associated with RPD/PD & O2 Sat Independence with exercise equipment Exercise tolerated well Strength training completed today  Goals Unmet:  Not Applicable  Comments: Pt able to follow exercise prescription today without complaint.  Will continue to monitor for progression.    Dr. Emily Filbert is Medical Director for [place] and [facility].

## 2018-07-15 ENCOUNTER — Encounter: Payer: Medicare Other | Admitting: *Deleted

## 2018-07-15 DIAGNOSIS — J449 Chronic obstructive pulmonary disease, unspecified: Secondary | ICD-10-CM | POA: Diagnosis not present

## 2018-07-15 NOTE — Progress Notes (Signed)
Discharge Progress Report  Patient Details  Name: Ernest Mclean MRN: [id number] Date of Birth: 06-03-1940 Referring Provider:     Pulmonary Rehab from 04/01/2018 in [facility]  Referring Provider  Kasa       Number of Visits: 41  Reason for Discharge:  Patient reached a stable level of exercise. Patient independent in their exercise. Patient has met program and personal goals.  Smoking History:  Social History   Tobacco Use  Smoking Status Former Smoker  . Packs/day: 1.00  . Years: 30.00  . Pack years: 30.00  . Types: Cigarettes  . Last attempt to quit: 12/14/2001  . Years since quitting: 16.5  Smokeless Tobacco Never Used  Tobacco Comment   quit 12/14/2001    Diagnosis:  Chronic obstructive pulmonary disease, unspecified COPD type ([place])  ADL UCSD: Pulmonary Assessment Scores    Row Name 04/01/18 1412 07/01/18 1026       ADL UCSD   ADL Phase  Entry  Exit    SOB Score total  3  13    Rest  0  0    Walk  0  1    Stairs  0  2    Bath  0  0    Dress  0  0    Shop  0  1      CAT Score   CAT Score  4  7      mMRC Score   mMRC Score  1  -       Initial Exercise Prescription: Initial Exercise Prescription - 04/01/18 1600      Date of Initial Exercise RX and Referring Provider   Date  04/01/18    Referring Provider  Kasa      Treadmill   MPH  1    Grade  0    Minutes  15   as tolerated - stop when needed to rest   METs  1.77      NuStep   Level  1    SPM  80    Minutes  15    METs  1.5      REL-XR   Level  1    Speed  50    Minutes  15    METs  1.5      Prescription Details   Frequency (times per week)  3    Duration  Progress to 45 minutes of aerobic exercise without signs/symptoms of physical distress      Intensity   THRR 40-80% of Max Heartrate  94-128    Ratings of Perceived Exertion  11-13    Perceived Dyspnea  0-4      Resistance Training   Training Prescription  Yes    Weight  3 lb    Reps  10-15        Discharge Exercise Prescription (Final Exercise Prescription Changes): Exercise Prescription Changes - 07/02/18 0900      Response to Exercise   Blood Pressure (Admit)  110/60    Blood Pressure (Exit)  132/60    Heart Rate (Admit)  62 bpm    Heart Rate (Exercise)  104 bpm    Heart Rate (Exit)  67 bpm    Oxygen Saturation (Admit)  92 %    Oxygen Saturation (Exercise)  90 %    Oxygen Saturation (Exit)  93 %    Rating of Perceived Exertion (Exercise)  13    Perceived Dyspnea (Exercise)  2    Symptoms  none    Duration  Continue with 45 min of aerobic exercise without signs/symptoms of physical distress.    Intensity  THRR unchanged   use RPE     Progression   Progression  Continue to progress workloads to maintain intensity without signs/symptoms of physical distress.    Average METs  2      Resistance Training   Training Prescription  Yes    Weight  6 lb    Reps  10-15      Interval Training   Interval Training  No      Treadmill   MPH  1.1    Grade  0.5    Minutes  15    METs  1.92      NuStep   Level  3    SPM  80    Minutes  15    METs  2.3      REL-XR   Level  2    Speed  50    Minutes  15    METs  1.8       Functional Capacity: 6 Minute Walk    Row Name 04/01/18 1606 07/05/18 1046       6 Minute Walk   Phase  Initial  Discharge    Distance  733 feet  1000 feet    Distance % Change  -  36.42 %    Distance Feet Change  -  267 ft    Walk Time  6 minutes  5.8 minutes    # of Rest Breaks  0  1    MPH  1.39  1.9    METS  1.45  1.7    RPE  13  13    Perceived Dyspnea   0  1    VO2 Peak  5.06  5.91    Symptoms  Yes (comment)  Yes (comment)    Comments  R calf pain  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 He experiences this regularly.    Resting HR  61 bpm  62 bpm    Resting BP  148/64  140/80    Resting Oxygen Saturation   95 %  93 %    Exercise Oxygen Saturation  during 6 min walk  92 %  90 %    Max Ex. HR  76 bpm  60 bpm    Max  Ex. BP  156/60  138/60    2 Minute Post BP  142/64  126/70      Interval HR   1 Minute HR  59  -    2 Minute HR  58  -    3 Minute HR  61  -    5 Minute HR  65  -    6 Minute HR  76  -    2 Minute Post HR  59  -    Interval Heart Rate?  Yes  -      Interval Oxygen   Interval Oxygen?  Yes  -    Baseline Oxygen Saturation %  95 %  -    1 Minute Oxygen Saturation %  93 %  -    1 Minute Liters of Oxygen  0 L  -    2 Minute Oxygen Saturation %  94 %  -    2 Minute Liters of Oxygen  0 L  -    3 Minute  Oxygen Saturation %  93 %  -    3 Minute Liters of Oxygen  0 L  -    4 Minute Liters of Oxygen  0 L  -    5 Minute Oxygen Saturation %  92 %  -    5 Minute Liters of Oxygen  0 L  -    6 Minute Oxygen Saturation %  94 %  -    6 Minute Liters of Oxygen  0 L  -    2 Minute Post Oxygen Saturation %  96 %  -    2 Minute Post Liters of Oxygen  0 L  -       Psychological, QOL, Others - Outcomes: PHQ 2/9: Depression screen [facility] 2/9 04/22/2018 04/01/2018 07/16/2017 03/19/2017 11/15/2016  Decreased Interest 0 0 0 0 0  Down, Depressed, Hopeless 0 0 0 0 0  PHQ - 2 Score 0 0 0 0 0  Altered sleeping 0 0 - - -  Tired, decreased energy 0 1 - - -  Change in appetite 0 0 - - -  Feeling bad or failure about yourself  0 0 - - -  Trouble concentrating 0 0 - - -  Moving slowly or fidgety/restless 0 0 - - -  Suicidal thoughts 0 0 - - -  PHQ-9 Score 0 1 - - -  Difficult doing work/chores Not difficult at all Not difficult at all - - -    Quality of Life:   Personal Goals: Goals established at orientation with interventions provided to work toward goal. Personal Goals and Risk Factors at Admission - 04/01/18 1404      Core Components/Risk Factors/Patient Goals on Admission    Weight Management  Yes;Weight Loss    Intervention  Weight Management: Develop a combined nutrition and exercise program designed to reach desired caloric intake, while maintaining appropriate intake of nutrient and fiber,  sodium and fats, and appropriate energy expenditure required for the weight goal.;Weight Management: Provide education and appropriate resources to help participant work on and attain dietary goals.;Weight Management/Obesity: Establish reasonable short term and long term weight goals.    Admit Weight  229 lb 8 oz (104.1 kg)    Goal Weight: Short Term  225 lb (102.1 kg)    Goal Weight: Long Term  215 lb (97.5 kg)    Expected Outcomes  Short Term: Continue to assess and modify interventions until short term weight is achieved;Long Term: Adherence to nutrition and physical activity/exercise program aimed toward attainment of established weight goal;Weight Loss: Understanding of general recommendations for a balanced deficit meal plan, which promotes 1-2 lb weight loss per week and includes a negative energy balance of [phone] kcal/d;Understanding recommendations for meals to include 15-35% energy as protein, 25-35% energy from fat, 35-60% energy from carbohydrates, less than 265m of dietary cholesterol, 20-35 gm of total fiber daily;Understanding of distribution of calorie intake throughout the day with the consumption of 4-5 meals/snacks    Improve shortness of breath with ADL's  Yes    Intervention  Provide education, individualized exercise plan and daily activity instruction to help decrease symptoms of SOB with activities of daily living.    Expected Outcomes  Short Term: Improve cardiorespiratory fitness to achieve a reduction of symptoms when performing ADLs;Long Term: Be able to perform more ADLs without symptoms or delay the onset of symptoms    Diabetes  Yes    Intervention  Provide education about signs/symptoms and action to take for  hypo/hyperglycemia.;Provide education about proper nutrition, including hydration, and aerobic/resistive exercise prescription along with prescribed medications to achieve blood glucose in normal ranges: Fasting glucose 65-99 mg/dL    Expected Outcomes  Short Term:  Participant verbalizes understanding of the signs/symptoms and immediate care of hyper/hypoglycemia, proper foot care and importance of medication, aerobic/resistive exercise and nutrition plan for blood glucose control.;Long Term: Attainment of HbA1C < 7%.    Heart Failure  Yes    Intervention  Provide a combined exercise and nutrition program that is supplemented with education, support and counseling about heart failure. Directed toward relieving symptoms such as shortness of breath, decreased exercise tolerance, and extremity edema.    Expected Outcomes  Improve functional capacity of life;Short term: Attendance in program 2-3 days a week with increased exercise capacity. Reported lower sodium intake. Reported increased fruit and vegetable intake. Reports medication compliance.;Short term: Daily weights obtained and reported for increase. Utilizing diuretic protocols set by physician.;Long term: Adoption of self-care skills and reduction of barriers for early signs and symptoms recognition and intervention leading to self-care maintenance.    Hypertension  Yes    Intervention  Provide education on lifestyle modifcations including regular physical activity/exercise, weight management, moderate sodium restriction and increased consumption of fresh fruit, vegetables, and low fat dairy, alcohol moderation, and smoking cessation.;Monitor prescription use compliance.    Expected Outcomes  Short Term: Continued assessment and intervention until BP is < 140/65m HG in hypertensive participants. < 130/857mHG in hypertensive participants with diabetes, heart failure or chronic kidney disease.;Long Term: Maintenance of blood pressure at goal levels.    Lipids  Yes    Intervention  Provide education and support for participant on nutrition & aerobic/resistive exercise along with prescribed medications to achieve LDL <705mHDL >21m52m  Expected Outcomes  Short Term: Participant states understanding of desired  cholesterol values and is compliant with medications prescribed. Participant is following exercise prescription and nutrition guidelines.;Long Term: Cholesterol controlled with medications as prescribed, with individualized exercise RX and with personalized nutrition plan. Value goals: LDL < 70mg67mL > 40 mg.        Personal Goals Discharge: Goals and Risk Factor Review    Row Name 04/15/18 1458 05/29/18 1417 06/17/18 1632         Core Components/Risk Factors/Patient Goals Review   Personal Goals Review  Lipids;Heart Failure;Improve shortness of breath with ADL's;Weight Management/Obesity;Diabetes  Lipids;Heart Failure;Improve shortness of breath with ADL's;Weight Management/Obesity;Diabetes  Lipids;Heart Failure;Improve shortness of breath with ADL's;Weight Management/Obesity;Diabetes     Review  LarryMartiees that his blood flow is not good to his right leg. Sometimes he has to take a break on the treadmill because it get painful. He is checking his sugar at home and here in the program. When he feels like he is getting low sometimes he likes to be checked. He brings apple juice incase his sugar is low after class. We will check his machine against ours if he would like to use his.  Patient has been maintaing his weight and his diabetes is under control. He still has issues with his blood flow in his right calve but is working hard to better his health. He has been getting more stamina to do his ADL's.   LarryKdynbeen doing well in thr program and will continue to exercise after [facility]. He wants to be able to walk with his vascular issues in his right calve as much as he can. He sees his doctor regularly and know  what he has to do.     Expected Outcomes  Short: Attend [facility] regularly to improve shortness of breath with ADL's. Long: maintain independence with ADL's   Short: maintain attendance in [place]. Short: graduate [facility] and continue exercising.  Short: graduate [place]. Long:  Maintain exercise and ADLs post [facility].        Exercise Goals and Review: Exercise Goals    Row Name 04/01/18 1605             Exercise Goals   Increase Physical Activity  Yes       Intervention  Provide advice, education, support and counseling about physical activity/exercise needs.;Develop an individualized exercise prescription for aerobic and resistive training based on initial evaluation findings, risk stratification, comorbidities and participant's personal goals.       Expected Outcomes  Short Term: Attend rehab on a regular basis to increase amount of physical activity.;Long Term: Add in home exercise to make exercise part of routine and to increase amount of physical activity.;Long Term: Exercising regularly at least 3-5 days a week.       Increase Strength and Stamina  Yes       Intervention  Provide advice, education, support and counseling about physical activity/exercise needs.;Develop an individualized exercise prescription for aerobic and resistive training based on initial evaluation findings, risk stratification, comorbidities and participant's personal goals.       Expected Outcomes  Short Term: Increase workloads from initial exercise prescription for resistance, speed, and METs.;Short Term: Perform resistance training exercises routinely during rehab and add in resistance training at home;Long Term: Improve cardiorespiratory fitness, muscular endurance and strength as measured by increased METs and functional capacity (6MWT)       Able to understand and use rate of perceived exertion (RPE) scale  Yes       Intervention  Provide education and explanation on how to use RPE scale       Expected Outcomes  Long Term:  Able to use RPE to guide intensity level when exercising independently;Short Term: Able to use RPE daily in rehab to express subjective intensity level       Able to understand and use Dyspnea scale  Yes       Intervention  Provide education and explanation on  how to use Dyspnea scale       Expected Outcomes  Short Term: Able to use Dyspnea scale daily in rehab to express subjective sense of shortness of breath during exertion;Long Term: Able to use Dyspnea scale to guide intensity level when exercising independently       Knowledge and understanding of Target Heart Rate Range (THRR)  Yes       Intervention  Provide education and explanation of THRR including how the numbers were predicted and where they are located for reference       Expected Outcomes  Short Term: Able to state/look up THRR;Short Term: Able to use daily as guideline for intensity in rehab;Long Term: Able to use THRR to govern intensity when exercising independently       Able to check pulse independently  Yes       Intervention  Provide education and demonstration on how to check pulse in carotid and radial arteries.;Review the importance of being able to check your own pulse for safety during independent exercise       Expected Outcomes  Short Term: Able to explain why pulse checking is important during independent exercise;Long Term: Able to check pulse independently and accurately  Understanding of Exercise Prescription  Yes       Intervention  Provide education, explanation, and written materials on patient's individual exercise prescription       Expected Outcomes  Short Term: Able to explain program exercise prescription;Long Term: Able to explain home exercise prescription to exercise independently          Exercise Goals Re-Evaluation: Exercise Goals Re-Evaluation    Row Name 04/05/18 1016 04/11/18 0851 04/24/18 1051 04/25/18 0946 05/08/18 1231     Exercise Goal Re-Evaluation   Exercise Goals Review  Able to understand and use rate of perceived exertion (RPE) scale;Able to understand and use Dyspnea scale;Knowledge and understanding of Target Heart Rate Range (THRR);Understanding of Exercise Prescription  Increase Physical Activity;Increase Strength and Stamina;Able to  understand and use rate of perceived exertion (RPE) scale;Able to understand and use Dyspnea scale  Increase Physical Activity;Able to understand and use rate of perceived exertion (RPE) scale;Knowledge and understanding of Target Heart Rate Range (THRR);Understanding of Exercise Prescription;Increase Strength and Stamina;Able to understand and use Dyspnea scale;Able to check pulse independently  Increase Physical Activity;Increase Strength and Stamina;Able to understand and use rate of perceived exertion (RPE) scale;Able to understand and use Dyspnea scale;Knowledge and understanding of Target Heart Rate Range (THRR);Able to check pulse independently;Understanding of Exercise Prescription  Increase Physical Activity;Able to understand and use rate of perceived exertion (RPE) scale;Knowledge and understanding of Target Heart Rate Range (THRR);Understanding of Exercise Prescription;Increase Strength and Stamina;Able to understand and use Dyspnea scale   Comments  Reviewed RPE scale, THR and program prescription with pt today.  Pt voiced understanding and was given a copy of goals to take home.   Lemoyne has tolerated exercise well in first sessions.  Staff will monitor progress.   Reviewed home exercise with pt today.  Pt plans to walk at home for exercise.  Reviewed THR, pulse, RPE, sign and symptoms, NTG use, and when to call 911 or MD.  Also discussed weather considerations and indoor options.  Pt voiced understanding.  Zidane increased levels on NS and plasn to increase weight strength training next session.    Aleksandr increased resistance training weight up to 6 lbs. Continue to monitor progression.   Expected Outcomes  Short: Use RPE daily to regulate intensity. Long: Follow program prescription in THR.  Short - attend consistently Long - increase overall MET level  Short - increase walking time at home from 15-30 min Long - maintain exercise on his own  Short - increase to 6 lb weights for strength Long -  increase MET level  Short- increase level on NS. Long- Increase MET level and make exercise a permanent commitment.   [place] Name 05/21/18 1324 06/05/18 1320 06/20/18 0912 07/02/18 0949       Exercise Goal Re-Evaluation   Exercise Goals Review  Increase Physical Activity;Increase Strength and Stamina;Able to understand and use rate of perceived exertion (RPE) scale;Able to understand and use Dyspnea scale;Knowledge and understanding of Target Heart Rate Range (THRR);Understanding of Exercise Prescription  Increase Physical Activity;Increase Strength and Stamina;Able to understand and use rate of perceived exertion (RPE) scale;Able to understand and use Dyspnea scale;Understanding of Exercise Prescription  Increase Physical Activity;Increase Strength and Stamina;Able to understand and use rate of perceived exertion (RPE) scale;Able to understand and use Dyspnea scale;Knowledge and understanding of Target Heart Rate Range (THRR);Understanding of Exercise Prescription  Increase Physical Activity;Increase Strength and Stamina;Able to understand and use rate of perceived exertion (RPE) scale;Able to understand and use Dyspnea scale;Knowledge and  understanding of Target Heart Rate Range (THRR);Understanding of Exercise Prescription    Comments  Aziel tolerates exercise well and works in correct RPE range.  Staff will monitor progress.    Yechiel has increased speed and grade on TM.  he is up to 6 lb weights for strength work.    Kalyan consistently works at [employer].  He is able to complete 45 min continuous exercise.  he attends LW consistently.  Christepher has increased level on REL.  He consistently works at [employer].      Expected Outcomes  Short - increase level on TM Long - increase overall MET level  Short - continue to progress workloads Long - improve MET level  Short - continue to attend consistently Long - increase MET level  Short - complete LW program Long - maintain exercise on his own       Nutrition & Weight -  Outcomes: Pre Biometrics - 04/01/18 1604      Pre Biometrics   Height  6' 1"  (1.854 m)    Weight  229 lb 8 oz (104.1 kg)    Waist Circumference  44.5 inches    Hip Circumference  43 inches    Waist to Hip Ratio  1.03 %    BMI (Calculated)  30.29      Post Biometrics - 07/05/18 1054       Post  Biometrics   Height  6' 1"  (1.854 m)    Weight  228 lb (103.4 kg)    Waist Circumference  44 inches    Hip Circumference  41 inches    Waist to Hip Ratio  1.07 %    BMI (Calculated)  30.09       Nutrition: Nutrition Therapy & Goals - 04/10/18 1250      Nutrition Therapy   Diet  DM    Protein (specify units)  12-13oz    Fiber  35 grams    Whole Grain Foods  3 servings   eats whole grains regularly   Saturated Fats  15 max. grams    Fruits and Vegetables  6 servings/day   8 ideal. eats fruits and vegetables regularly   Sodium  2000 grams   per MD recommendation, follows 2g sodium diet strictly     Personal Nutrition Goals   Nutrition Goal  Experiment with breakfast options that are a combination of carbohydrate + protein rather than predominantly carbohydrate in order to prevent hyperglycemia prior to exercise    Personal Goal #2  Continue to work with your wife to keep sodium intake between 1500 and 2047m/day. Great job for finding lower sodium ingredient swaps and recipes!    Comments  He experiences dramatic BG changes within a matter of minutes, often dropping from the 300 range to 60 or even as low as 40. His wife tightly monitor his diet and keeps sodium intake lower than 20057mday. Pt typically eats 5 CHO servings per meal and 1-2 at snacks for a total of 17-18 CHO servings per day, which they state was recommended by endocrynologist. Wife weighs out foods and makes most meals from scratch using low sodium ingredients. They eat fruits and vegetables daily and monitor potassium intake in addition to sodium r/t CKD. They try to avoid foods with preservatives and try to buy meats  from local farms. If they eat out, wife looks up nutrition information ahead of time. HgbA1c 7.7 "best it's been in years" 3 weeks ago per wife. Breakfast: shredded wheat  cereal, oatmeal, oatmeal muffins, eggs / sausage occasionally. Snacks: NSA Klondike bar, fruit, unsalted nuts. If he experiences hypoglycemia he corrects with applejuice which is effective.      Intervention Plan   Intervention  Prescribe, educate and counsel regarding individualized specific dietary modifications aiming towards targeted core components such as weight, hypertension, lipid management, diabetes, heart failure and other comorbidities.    Expected Outcomes  Short Term Goal: A plan has been developed with personal nutrition goals set during dietitian appointment.;Long Term Goal: Adherence to prescribed nutrition plan.;Short Term Goal: Understand basic principles of dietary content, such as calories, fat, sodium, cholesterol and nutrients.       Nutrition Discharge: Nutrition Assessments - 04/01/18 1407      MEDFICTS Scores   Pre Score  24       Education Questionnaire Score: Knowledge Questionnaire Score - 07/01/18 1027      Knowledge Questionnaire Score   Pre Score  15/18    Post Score  15/18   reviewed with patient      Goals reviewed with patient; copy given to patient.

## 2018-07-15 NOTE — Progress Notes (Signed)
Daily Session Note  Patient Details  Name: Ernest Mclean MRN: [id number] Date of Birth: 1940/09/04 Referring Provider:     Pulmonary Rehab from 04/01/2018 in [facility]  Referring Provider  Kasa      Encounter Date: 07/15/2018  Check In: Session Check In - 07/15/18 1024      Check-In   Supervising physician immediately available to respond to emergencies  [facility] immediately available ER MD    Physician(s)  Jimmye Norman and Burlene Arnt    Location  [facility]-Cardiac & Pulmonary Rehab    Staff Present  Earlean Shawl, BS, ACSM CEP, Exercise Physiologist;Joseph [employer], BA, ACSM CEP, Exercise Physiologist    Medication changes reported      No    Fall or balance concerns reported     No    Tobacco Cessation  No Change    Warm-up and Cool-down  Performed as group-led instruction    Resistance Training Performed  Yes    VAD Patient?  No    PAD/SET Patient?  No      Pain Assessment   Currently in Pain?  No/denies    Multiple Pain Sites  No          Social History   Tobacco Use  Smoking Status Former Smoker  . Packs/day: 1.00  . Years: 30.00  . Pack years: 30.00  . Types: Cigarettes  . Last attempt to quit: 12/14/2001  . Years since quitting: 16.5  Smokeless Tobacco Never Used  Tobacco Comment   quit 12/14/2001    Goals Met:  Proper associated with RPD/PD & O2 Sat Independence with exercise equipment Exercise tolerated well No report of cardiac concerns or symptoms Strength training completed today  Goals Unmet:  Not Applicable  Comments:  Ernest Mclean graduated today from  rehab with 36 sessions completed.  Details of the patient's exercise prescription and what He needs to do in order to continue the prescription and progress were discussed with patient.  Patient was given a copy of prescription and goals.  Patient verbalized understanding.  Ernest Mclean plans to continue to exercise by joining the [employer] [facility].    Dr. Emily Filbert is Medical Director for [facility] and [facility].

## 2018-07-15 NOTE — Progress Notes (Signed)
Pulmonary Individual Treatment Plan  Patient Details  Name: Ernest Mclean MRN: [id number] Date of Birth: 11-19-1940 Referring Provider:     Pulmonary Rehab from 04/01/2018 in [facility] Cardiac and Pulmonary Rehab  Referring Provider  Kasa      Initial Encounter Date:    Pulmonary Rehab from 04/01/2018 in [facility] Cardiac and Pulmonary Rehab  Date  04/01/18      Visit Diagnosis: Chronic obstructive pulmonary disease, unspecified COPD type ([place])  Patient's Home Medications on Admission:  Current Outpatient Medications:  .  albuterol (PROVENTIL HFA;VENTOLIN HFA) 108 (90 Base) MCG/ACT inhaler, Inhale 2 puffs into the lungs every 4 (four) hours as needed for wheezing or shortness of breath., Disp: 25.5 Inhaler, Rfl: 1 .  amLODipine (NORVASC) 5 MG tablet, Take 5 mg by mouth daily. , Disp: , Rfl:  .  ammonium lactate (AMLACTIN) 12 % cream, Apply 1 g topically as needed for dry skin., Disp: , Rfl:  .  apixaban (ELIQUIS) 5 MG TABS tablet, Take 5 mg by mouth 2 (two) times daily., Disp: , Rfl:  .  aspirin EC 81 MG tablet, Take 81 mg by mouth daily., Disp: , Rfl:  .  citalopram (CELEXA) 20 MG tablet, Take 20 mg by mouth at bedtime. , Disp: , Rfl: 5 .  CRANBERRY PO, Take 820 mg by mouth daily., Disp: , Rfl:  .  cyanocobalamin 500 MCG tablet, Take 500 mcg by mouth daily., Disp: , Rfl:  .  desonide (DESOWEN) 0.05 % cream, Apply topically., Disp: , Rfl:  .  diazepam (VALIUM) 5 MG tablet, Take 5 mg by mouth every 12 (twelve) hours as needed for anxiety. , Disp: , Rfl:  .  ergocalciferol (VITAMIN D2) 50000 units capsule, Take 50,000 Units by mouth every 30 (thirty) days., Disp: , Rfl:  .  furosemide (LASIX) 20 MG tablet, Take 20 mg by mouth daily. Take one tablet daily.  Can take extra tablet in afternoon if increase weight gain of 3 pounds or increased swelling., Disp: , Rfl:  .  insulin glargine (LANTUS) 100 UNIT/ML injection, Inject 70 Units into the skin at bedtime., Disp: , Rfl:  .  insulin lispro  (HUMALOG) 100 UNIT/ML injection, Inject 15-25 Units into the skin 3 (three) times daily before meals. 15 units before breakfast, 25 units before lunch, and 25 units before supper plus additional units for sliding scale, Disp: , Rfl:  .  ketoconazole (NIZORAL) 2 % shampoo, Apply 1 application topically 2 (two) times a week., Disp: , Rfl:  .  lisinopril (PRINIVIL,ZESTRIL) 5 MG tablet, Take 5 mg by mouth daily., Disp: , Rfl:  .  metFORMIN (GLUCOPHAGE) 500 MG tablet, Take 1,000 mg by mouth every evening. , Disp: , Rfl:  .  metoprolol (LOPRESSOR) 100 MG tablet, Take 100 mg by mouth 2 (two) times daily., Disp: , Rfl: 0 .  OMEGA-3 FATTY ACIDS-VITAMIN E PO, Take 1 capsule by mouth daily., Disp: , Rfl:  .  pravastatin (PRAVACHOL) 20 MG tablet, Take 20 mg by mouth at bedtime., Disp: , Rfl: 3 .  tiotropium (SPIRIVA) 18 MCG inhalation capsule, Place 18 mcg into inhaler and inhale daily., Disp: , Rfl:  .  vitamin C (ASCORBIC ACID) 500 MG tablet, Take 500 mg by mouth daily., Disp: , Rfl:  .  WIXELA INHUB 500-50 MCG/DOSE AEPB, TAKE 1 PUFF BY MOUTH TWICE A DAY, Disp: 180 each, Rfl: 1  Past Medical History: Past Medical History:  Diagnosis Date  . Atrial fibrillation ([place])   . Basal  cell carcinoma 06/2017   Left Ear  . CAD (coronary artery disease)   . CKD (chronic kidney disease)   . Congestive heart failure ([place])   . COPD (chronic obstructive pulmonary disease) ([place])   . Diabetes ([place])   . Hypertension   . OSA on CPAP   . 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 Packs/day: 1.00  . Years: 30.00  . Pack years: 30.00  . Types: Cigarettes  . Last attempt to quit: 12/14/2001  . Years since quitting: 16.5  Smokeless Tobacco Never Used  Tobacco Comment   quit 12/14/2001    Labs: Recent Review Flowsheet Data    Labs for ITP Cardiac and Pulmonary Rehab Latest Ref Rng & Units 06/20/2014 06/23/2014 05/13/2015 08/01/2016    Cholestrol 0 - 200 mg/dL 160 - - -   LDLCALC 0 - 100 mg/dL 70 - - -   HDL 40 - 60 mg/dL 28(L) - - -   Trlycerides 0 - 200 mg/dL 311(H) - - -   Hemoglobin A1c 4.8 - 5.6 % - 10.8(H) 7.9(H) 8.4(H)   HCO3 20.0 - 28.0 mmol/L - - - 32.4(H)       Pulmonary Assessment Scores: Pulmonary Assessment Scores    Row Name 04/01/18 1412 07/01/18 1026       ADL UCSD   ADL Phase  Entry  Exit    SOB Score total  3  13    Rest  0  0    Walk  0  1    Stairs  0  2    Bath  0  0    Dress  0  0    Shop  0  1      CAT Score   CAT Score  4  7      mMRC Score   mMRC Score  1  -       Pulmonary Function Assessment: Pulmonary Function Assessment - 04/01/18 1413      Initial Spirometry Results   FVC%  64 %    FEV1%  68 %    FEV1/FVC Ratio  77    Comments  test performed on 02/10/2015      Post Bronchodilator Spirometry Results   FVC%  70 %    FEV1%  75 %    FEV1/FVC Ratio  78    Comments  test performed on 02/10/2015      Breath   Bilateral Breath Sounds  Clear    Shortness of Breath  Yes;Limiting activity       Exercise Target Goals: Exercise Program Goal: Individual exercise prescription set using results from initial 6 min walk test and THRR while considering  patient's activity barriers and safety.   Exercise Prescription Goal: Initial exercise prescription builds to 30-45 minutes a day of aerobic activity, 2-3 days per week.  Home exercise guidelines will be given to patient during program as part of exercise prescription that the participant will acknowledge.  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 He experiences this regularly.    Resting HR  61 bpm  62 bpm    Resting BP  148/64  140/80    Resting Oxygen Saturation   95 %  93 %    Exercise Oxygen Saturation  during 6 min walk  92 %  90 %    Max Ex. HR  76 bpm  60 bpm    Max Ex. BP  156/60  138/60    2 Minute Post BP  142/64  126/70      Interval HR   1 Minute HR  59  -    2 Minute HR  58  -    3 Minute HR  61  -    5 Minute HR  65  -    6 Minute HR  76  -    2 Minute Post HR  59  -    Interval Heart Rate?  Yes  -      Interval Oxygen   Interval Oxygen?  Yes  -    Baseline Oxygen Saturation %  95 %  -    1 Minute Oxygen Saturation %  93 %  -    1 Minute Liters of Oxygen  0 L  -    2 Minute Oxygen Saturation %  94 %  -    2 Minute Liters of Oxygen  0 L  -    3 Minute Oxygen Saturation %  93 %  -    3 Minute Liters of Oxygen  0 L  -    4 Minute Liters of Oxygen  0 L  -    5 Minute Oxygen Saturation %  92 %  -    5 Minute Liters of Oxygen  0 L  -    6 Minute Oxygen Saturation %  94 %  -    6 Minute Liters of Oxygen  0 L  -    2 Minute Post Oxygen Saturation %  96 %  -    2 Minute Post Liters of Oxygen  0 L  -      Oxygen Initial Assessment: Oxygen Initial Assessment - 04/01/18 1356      Home Oxygen   Home Oxygen Device  Home Concentrator;E-Tanks    Sleep Oxygen Prescription  Continuous    Liters per minute  2    Home Exercise Oxygen Prescription  None    Home at Rest Exercise Oxygen Prescription  None    Compliance with Home Oxygen Use  Yes      Initial 6 min Walk   Oxygen Used  None      Program Oxygen Prescription   Program Oxygen Prescription  None      Intervention   Short Term Goals  To learn and demonstrate proper use of respiratory medications;To learn and demonstrate proper pursed lip breathing techniques or other breathing techniques.;To learn and understand  importance of maintaining oxygen  saturations>88%;To learn and understand importance of monitoring SPO2 with pulse oximeter and demonstrate accurate use of the pulse oximeter.;To learn and exhibit compliance with exercise, home and travel O2 prescription    Long  Term Goals  Exhibits compliance with exercise, home and travel O2 prescription;Verbalizes importance of monitoring SPO2 with pulse oximeter and return demonstration;Maintenance of O2 saturations>88%;Exhibits proper breathing techniques, such as pursed lip breathing or other method taught during program session;Compliance with respiratory medication;Demonstrates proper use of MDI's       Oxygen Re-Evaluation: Oxygen Re-Evaluation    Row Name 04/05/18 1016 04/15/18 1452 05/24/18 1040 06/12/18 1430 07/10/18 1408     Program Oxygen Prescription   Program Oxygen Prescription  None  None  None  None  None     Home Oxygen   Home Oxygen Device  Home Concentrator;E-Tanks  Home Concentrator;E-Tanks  Home Concentrator;E-Tanks  Home Concentrator;E-Tanks  Home Concentrator;E-Tanks   Sleep Oxygen Prescription  Continuous  Continuous  Continuous  Continuous  Continuous   Liters per minute  '2  2  2  2  2   '$ Home Exercise Oxygen Prescription  None  None  None  None  None   Home at Rest Exercise Oxygen Prescription  None  None  None  None  None   Compliance with Home Oxygen Use  Yes  Yes  Yes  Yes  -     Goals/Expected Outcomes   Short Term Goals  To learn and demonstrate proper use of respiratory medications;To learn and demonstrate proper pursed lip breathing techniques or other breathing techniques.;To learn and understand importance of maintaining oxygen saturations>88%;To learn and understand importance of monitoring SPO2 with pulse oximeter and demonstrate accurate use of the pulse oximeter.;To learn and exhibit compliance with exercise, home and travel O2 prescription  To learn and demonstrate proper use of respiratory medications;To learn and  demonstrate proper pursed lip breathing techniques or other breathing techniques.;To learn and understand importance of maintaining oxygen saturations>88%;To learn and understand importance of monitoring SPO2 with pulse oximeter and demonstrate accurate use of the pulse oximeter.;To learn and exhibit compliance with exercise, home and travel O2 prescription  To learn and demonstrate proper use of respiratory medications;To learn and demonstrate proper pursed lip breathing techniques or other breathing techniques.;To learn and understand importance of maintaining oxygen saturations>88%;To learn and understand importance of monitoring SPO2 with pulse oximeter and demonstrate accurate use of the pulse oximeter.;To learn and exhibit compliance with exercise, home and travel O2 prescription  To learn and demonstrate proper use of respiratory medications;To learn and demonstrate proper pursed lip breathing techniques or other breathing techniques.;To learn and understand importance of maintaining oxygen saturations>88%;To learn and understand importance of monitoring SPO2 with pulse oximeter and demonstrate accurate use of the pulse oximeter.;To learn and exhibit compliance with exercise, home and travel O2 prescription  To learn and demonstrate proper use of respiratory medications;To learn and demonstrate proper pursed lip breathing techniques or other breathing techniques.;To learn and understand importance of maintaining oxygen saturations>88%;To learn and understand importance of monitoring SPO2 with pulse oximeter and demonstrate accurate use of the pulse oximeter.;To learn and exhibit compliance with exercise, home and travel O2 prescription   Long  Term Goals  Exhibits compliance with exercise, home and travel O2 prescription;Verbalizes importance of monitoring SPO2 with pulse oximeter and return demonstration;Maintenance of O2 saturations>88%;Exhibits proper breathing techniques, such as pursed lip  breathing or other method taught during program session;Compliance with respiratory medication;Demonstrates proper use of MDI's  Exhibits compliance with exercise,  home and travel O2 prescription;Verbalizes importance of monitoring SPO2 with pulse oximeter and return demonstration;Maintenance of O2 saturations>88%;Exhibits proper breathing techniques, such as pursed lip breathing or other method taught during program session;Compliance with respiratory medication;Demonstrates proper use of MDI's  Exhibits compliance with exercise, home and travel O2 prescription;Verbalizes importance of monitoring SPO2 with pulse oximeter and return demonstration;Maintenance of O2 saturations>88%;Exhibits proper breathing techniques, such as pursed lip breathing or other method taught during program session;Compliance with respiratory medication;Demonstrates proper use of MDI's  Exhibits compliance with exercise, home and travel O2 prescription;Verbalizes importance of monitoring SPO2 with pulse oximeter and return demonstration;Maintenance of O2 saturations>88%;Exhibits proper breathing techniques, such as pursed lip breathing or other method taught during program session;Compliance with respiratory medication;Demonstrates proper use of MDI's  Exhibits compliance with exercise, home and travel O2 prescription;Verbalizes importance of monitoring SPO2 with pulse oximeter and return demonstration;Maintenance of O2 saturations>88%;Exhibits proper breathing techniques, such as pursed lip breathing or other method taught during program session;Compliance with respiratory medication;Demonstrates proper use of MDI's   Comments  Reviewed PLB technique with pt.  Talked about how it work and it's important to maintaining his exercise saturations.    Aarian checks his oxygen at home with his own pulse oximeter. He wears his CPAP with a 2 liter bleed in every night. He knows what his resting heart rate is and what his oxygen runs at home.  Informed him to check his oxygen when he is exerting himself to make sure he is staying above 88 percent.  His breathing feels better since exercising. He has been between 93 and 97 percent oxygen when he is out shopping and exerting himself. He has no questions with his respiratory medications. He is going to try to increase his speeds and resistance to help with his shortness of breath.  Roger wants to see if there is a correlation between this oxygen and his right calve. He states that the doctor is wondering the same thing. Explained to patient that his oxygen levels are good and his blood flow is something that is making his leg hurt. He states that his leg is feeling a little better and is continuing to better himself.   Spoke to patient about COPD Action Plan. Reviewed and talked with the patient about the different levels of COPD severity that they should review on a daily basis and the steps they can take to manage their COPD. Went over pertinent actions for the patient can take when they feel like they are having a COPD exacerbation and the necessary treatment if needed. If patient has any changes with their breathing or feel like they are in a different zone of severity to inform staff for re-evaluation. Patient verbalizes understanding. Copy given to patient.   Goals/Expected Outcomes  Short: Become more profiecient at using PLB.   Long: Become independent at using PLB.  Short: monitor oxygen at home with exertion. Long: maintain oxygen saturations above 88 percent independently.  Short: increase levels on machines. Long: maintain shortness of breath with increased exercise.  Short: talk to doctor about his oxygen levels and if there is any correlation with his leg. Long: maintain exercise to improve blood flow to his leg.  Short: Follow COPD action plan. Long: Report any changes and severity of COPD.      Oxygen Discharge (Final Oxygen Re-Evaluation): Oxygen Re-Evaluation - 07/10/18 1408       Program Oxygen Prescription   Program Oxygen Prescription  None      Home Oxygen  Home Oxygen Device  Home Concentrator;E-Tanks    Sleep Oxygen Prescription  Continuous    Liters per minute  2    Home Exercise Oxygen Prescription  None    Home at Rest Exercise Oxygen Prescription  None      Goals/Expected Outcomes   Short Term Goals  To learn and demonstrate proper use of respiratory medications;To learn and demonstrate proper pursed lip breathing techniques or other breathing techniques.;To learn and understand importance of maintaining oxygen saturations>88%;To learn and understand importance of monitoring SPO2 with pulse oximeter and demonstrate accurate use of the pulse oximeter.;To learn and exhibit compliance with exercise, home and travel O2 prescription    Long  Term Goals  Exhibits compliance with exercise, home and travel O2 prescription;Verbalizes importance of monitoring SPO2 with pulse oximeter and return demonstration;Maintenance of O2 saturations>88%;Exhibits proper breathing techniques, such as pursed lip breathing or other method taught during program session;Compliance with respiratory medication;Demonstrates proper use of MDI's    Comments  Spoke to patient about COPD Action Plan. Reviewed and talked with the patient about the different levels of COPD severity that they should review on a daily basis and the steps they can take to manage their COPD. Went over pertinent actions for the patient can take when they feel like they are having a COPD exacerbation and the necessary treatment if needed. If patient has any changes with their breathing or feel like they are in a different zone of severity to inform staff for re-evaluation. Patient verbalizes understanding. Copy given to patient.    Goals/Expected Outcomes  Short: Follow COPD action plan. Long: Report any changes and severity of COPD.       Initial Exercise Prescription: Initial Exercise Prescription - 04/01/18 1600       Date of Initial Exercise RX and Referring Provider   Date  04/01/18    Referring Provider  Kasa      Treadmill   MPH  1    Grade  0    Minutes  15   as tolerated - stop when needed to rest   METs  1.77      NuStep   Level  1    SPM  80    Minutes  15    METs  1.5      REL-XR   Level  1    Speed  50    Minutes  15    METs  1.5      Prescription Details   Frequency (times per week)  3    Duration  Progress to 45 minutes of aerobic exercise without signs/symptoms of physical distress      Intensity   THRR 40-80% of Max Heartrate  94-128    Ratings of Perceived Exertion  11-13    Perceived Dyspnea  0-4      Resistance Training   Training Prescription  Yes    Weight  3 lb    Reps  10-15       Perform Capillary Blood Glucose checks as needed.  Exercise Prescription Changes: Exercise Prescription Changes    Row Name 04/11/18 0800 04/25/18 0900 05/08/18 1200 05/21/18 1300 06/05/18 1300     Response to Exercise   Blood Pressure (Admit)  130/60  128/60  122/58  132/60  112/54   Blood Pressure (Exercise)  142/80  -  -  -  -   Blood Pressure (Exit)  96/54  124/64  112/60  128/64  110/70   Heart Rate (  Admit)  60 bpm  58 bpm  57 bpm  90 bpm  69 bpm   Heart Rate (Exercise)  60 bpm  120 bpm  70 bpm  86 bpm  60 bpm   Heart Rate (Exit)  63 bpm  62 bpm  64 bpm  63 bpm  63 bpm   Oxygen Saturation (Admit)  95 %  96 %  95 %  93 %  95 %   Oxygen Saturation (Exercise)  97 %  96 %  90 %  93 %  93 %   Oxygen Saturation (Exit)  96 %  98 %  97 %  96 %  97 %   Rating of Perceived Exertion (Exercise)  '13  13  13  13  13   '$ Perceived Dyspnea (Exercise)  '1  1  3  2  2   '$ Symptoms  none  none  none  none  none   Comments  day 2 exercise  -  -  -  -   Duration  Continue with 45 min of aerobic exercise without signs/symptoms of physical distress.  Continue with 45 min of aerobic exercise without signs/symptoms of physical distress.  Progress to 30 minutes of  aerobic without signs/symptoms  of physical distress  Continue with 45 min of aerobic exercise without signs/symptoms of physical distress.  Continue with 45 min of aerobic exercise without signs/symptoms of physical distress.   Intensity  Other (comment) not reaching THR but RPE 13  Other (comment) HR 120 not usual  THRR unchanged  THRR unchanged  THRR unchanged     Progression   Progression  Continue to progress workloads to maintain intensity without signs/symptoms of physical distress.  Continue to progress workloads to maintain intensity without signs/symptoms of physical distress.  Continue to progress workloads to maintain intensity without signs/symptoms of physical distress.  Continue to progress workloads to maintain intensity without signs/symptoms of physical distress.  Continue to progress workloads to maintain intensity without signs/symptoms of physical distress.   Average METs  2.13  2  2.'13  2  2     '$ Resistance Training   Training Prescription  Yes  Yes  Yes  Yes  Yes   Weight  3 lb  3 lb  6  6 lb  6 lb   Reps  10-15  10-15  10-15  10-15  10-15     Interval Training   Interval Training  -  -  No  No  No     Treadmill   MPH  '1  1  1  1  '$ 1.1   Grade  0  0  0  0  0.5   Minutes  '15  15  15  15  15   '$ METs  1.77  1.77  1.77  1.77  1.92     NuStep   Level  '1  1  2  2  2   '$ SPM  80  80  80  80  80   Minutes  '15  15  15  15  15   '$ METs  3  2.4  2.1  2.4  2.3     REL-XR   Level  1  -  1.5  1.5  1.5   Speed  50  -  50  50  50   Minutes  15  -  '15  15  15   '$ METs  1.7  -  1.5  2  1.9   Row Name 06/20/18 0900 07/02/18 0900           Response to Exercise   Blood Pressure (Admit)  130/58  110/60      Blood Pressure (Exit)  122/58  132/60      Heart Rate (Admit)  65 bpm  62 bpm      Heart Rate (Exercise)  64 bpm  104 bpm      Heart Rate (Exit)  65 bpm  67 bpm      Oxygen Saturation (Admit)  97 %  92 %      Oxygen Saturation (Exercise)  94 %  90 %      Oxygen Saturation (Exit)  98 %  93 %      Rating of  Perceived Exertion (Exercise)  13  13      Perceived Dyspnea (Exercise)  2  2      Symptoms  none  none      Duration  Continue with 45 min of aerobic exercise without signs/symptoms of physical distress.  Continue with 45 min of aerobic exercise without signs/symptoms of physical distress.      Intensity  THRR unchanged  THRR unchanged use RPE        Progression   Progression  Continue to progress workloads to maintain intensity without signs/symptoms of physical distress.  Continue to progress workloads to maintain intensity without signs/symptoms of physical distress.      Average METs  2.14  2        Resistance Training   Training Prescription  Yes  Yes      Weight  6 lb  6 lb      Reps  10-15  10-15        Interval Training   Interval Training  No  No        Treadmill   MPH  1.1  1.1      Grade  0.5  0.5      Minutes  15  15      METs  1.92  1.92        NuStep   Level  3  3      SPM  80  80      Minutes  15  15      METs  2.5  2.3        REL-XR   Level  1.5  2      Speed  50  50      Minutes  15  15      METs  2  1.8         Exercise Comments: Exercise Comments    Row Name 04/05/18 1016 04/24/18 1051 07/15/18 1025       Exercise Comments  First full day of exercise!  Patient was oriented to gym and equipment including functions, settings, policies, and procedures.  Patient's individual exercise prescription and treatment plan were reviewed.  All starting workloads were established based on the results of the 6 minute walk test done at initial orientation visit.  The plan for exercise progression was also introduced and progression will be customized based on patient's performance and goals.   Reviewed home exercise with pt today.  Pt plans to walk at home for exercise.  Reviewed THR, pulse, RPE, sign and symptoms, NTG use, and when to call 911 or MD.  Also discussed weather considerations and indoor options.  Pt voiced understanding.   Fritz Pickerel  graduated today from  rehab  with 36 sessions completed.  Details of the patient's exercise prescription and what He needs to do in order to continue the prescription and progress were discussed with patient.  Patient was given a copy of prescription and goals.  Patient verbalized understanding.  Celester plans to continue to exercise by joining the [employer] [facility].        Exercise Goals and Review: Exercise Goals    Row Name 04/01/18 1605             Exercise Goals   Increase Physical Activity  Yes       Intervention  Provide advice, education, support and counseling about physical activity/exercise needs.;Develop an individualized exercise prescription for aerobic and resistive training based on initial evaluation findings, risk stratification, comorbidities and participant's personal goals.       Expected Outcomes  Short Term: Attend rehab on a regular basis to increase amount of physical activity.;Long Term: Add in home exercise to make exercise part of routine and to increase amount of physical activity.;Long Term: Exercising regularly at least 3-5 days a week.       Increase Strength and Stamina  Yes       Intervention  Provide advice, education, support and counseling about physical activity/exercise needs.;Develop an individualized exercise prescription for aerobic and resistive training based on initial evaluation findings, risk stratification, comorbidities and participant's personal goals.       Expected Outcomes  Short Term: Increase workloads from initial exercise prescription for resistance, speed, and METs.;Short Term: Perform resistance training exercises routinely during rehab and add in resistance training at home;Long Term: Improve cardiorespiratory fitness, muscular endurance and strength as measured by increased METs and functional capacity (6MWT)       Able to understand and use rate of perceived exertion (RPE) scale  Yes       Intervention  Provide education and explanation on how to use RPE scale        Expected Outcomes  Long Term:  Able to use RPE to guide intensity level when exercising independently;Short Term: Able to use RPE daily in rehab to express subjective intensity level       Able to understand and use Dyspnea scale  Yes       Intervention  Provide education and explanation on how to use Dyspnea scale       Expected Outcomes  Short Term: Able to use Dyspnea scale daily in rehab to express subjective sense of shortness of breath during exertion;Long Term: Able to use Dyspnea scale to guide intensity level when exercising independently       Knowledge and understanding of Target Heart Rate Range (THRR)  Yes       Intervention  Provide education and explanation of THRR including how the numbers were predicted and where they are located for reference       Expected Outcomes  Short Term: Able to state/look up THRR;Short Term: Able to use daily as guideline for intensity in rehab;Long Term: Able to use THRR to govern intensity when exercising independently       Able to check pulse independently  Yes       Intervention  Provide education and demonstration on how to check pulse in carotid and radial arteries.;Review the importance of being able to check your own pulse for safety during independent exercise       Expected Outcomes  Short Term: Able to explain why pulse checking is important during independent exercise;Long  Term: Able to check pulse independently and accurately       Understanding of Exercise Prescription  Yes       Intervention  Provide education, explanation, and written materials on patient's individual exercise prescription       Expected Outcomes  Short Term: Able to explain program exercise prescription;Long Term: Able to explain home exercise prescription to exercise independently          Exercise Goals Re-Evaluation : Exercise Goals Re-Evaluation    Row Name 04/05/18 1016 04/11/18 0851 04/24/18 1051 04/25/18 0946 05/08/18 1231     Exercise Goal Re-Evaluation    Exercise Goals Review  Able to understand and use rate of perceived exertion (RPE) scale;Able to understand and use Dyspnea scale;Knowledge and understanding of Target Heart Rate Range (THRR);Understanding of Exercise Prescription  Increase Physical Activity;Increase Strength and Stamina;Able to understand and use rate of perceived exertion (RPE) scale;Able to understand and use Dyspnea scale  Increase Physical Activity;Able to understand and use rate of perceived exertion (RPE) scale;Knowledge and understanding of Target Heart Rate Range (THRR);Understanding of Exercise Prescription;Increase Strength and Stamina;Able to understand and use Dyspnea scale;Able to check pulse independently  Increase Physical Activity;Increase Strength and Stamina;Able to understand and use rate of perceived exertion (RPE) scale;Able to understand and use Dyspnea scale;Knowledge and understanding of Target Heart Rate Range (THRR);Able to check pulse independently;Understanding of Exercise Prescription  Increase Physical Activity;Able to understand and use rate of perceived exertion (RPE) scale;Knowledge and understanding of Target Heart Rate Range (THRR);Understanding of Exercise Prescription;Increase Strength and Stamina;Able to understand and use Dyspnea scale   Comments  Reviewed RPE scale, THR and program prescription with pt today.  Pt voiced understanding and was given a copy of goals to take home.   Eliyas has tolerated exercise well in first sessions.  Staff will monitor progress.   Reviewed home exercise with pt today.  Pt plans to walk at home for exercise.  Reviewed THR, pulse, RPE, sign and symptoms, NTG use, and when to call 911 or MD.  Also discussed weather considerations and indoor options.  Pt voiced understanding.  Soma increased levels on NS and plasn to increase weight strength training next session.    Abdon increased resistance training weight up to 6 lbs. Continue to monitor progression.   Expected Outcomes   Short: Use RPE daily to regulate intensity. Long: Follow program prescription in THR.  Short - attend consistently Long - increase overall MET level  Short - increase walking time at home from 15-30 min Long - maintain exercise on his own  Short - increase to 6 lb weights for strength Long - increase MET level  Short- increase level on NS. Long- Increase MET level and make exercise a permanent commitment.   [place] Name 05/21/18 1324 06/05/18 1320 06/20/18 0912 07/02/18 0949       Exercise Goal Re-Evaluation   Exercise Goals Review  Increase Physical Activity;Increase Strength and Stamina;Able to understand and use rate of perceived exertion (RPE) scale;Able to understand and use Dyspnea scale;Knowledge and understanding of Target Heart Rate Range (THRR);Understanding of Exercise Prescription  Increase Physical Activity;Increase Strength and Stamina;Able to understand and use rate of perceived exertion (RPE) scale;Able to understand and use Dyspnea scale;Understanding of Exercise Prescription  Increase Physical Activity;Increase Strength and Stamina;Able to understand and use rate of perceived exertion (RPE) scale;Able to understand and use Dyspnea scale;Knowledge and understanding of Target Heart Rate Range (THRR);Understanding of Exercise Prescription  Increase Physical Activity;Increase Strength and Stamina;Able to understand  and use rate of perceived exertion (RPE) scale;Able to understand and use Dyspnea scale;Knowledge and understanding of Target Heart Rate Range (THRR);Understanding of Exercise Prescription    Comments  Cainan tolerates exercise well and works in correct RPE range.  Staff will monitor progress.    Mahmood has increased speed and grade on TM.  he is up to 6 lb weights for strength work.    Achilles consistently works at [employer].  He is able to complete 45 min continuous exercise.  he attends LW consistently.  Oaklan has increased level on REL.  He consistently works at [employer].      Expected  Outcomes  Short - increase level on TM Long - increase overall MET level  Short - continue to progress workloads Long - improve MET level  Short - continue to attend consistently Long - increase MET level  Short - complete LW program Long - maintain exercise on his own       Discharge Exercise Prescription (Final Exercise Prescription Changes): Exercise Prescription Changes - 07/02/18 0900      Response to Exercise   Blood Pressure (Admit)  110/60    Blood Pressure (Exit)  132/60    Heart Rate (Admit)  62 bpm    Heart Rate (Exercise)  104 bpm    Heart Rate (Exit)  67 bpm    Oxygen Saturation (Admit)  92 %    Oxygen Saturation (Exercise)  90 %    Oxygen Saturation (Exit)  93 %    Rating of Perceived Exertion (Exercise)  13    Perceived Dyspnea (Exercise)  2    Symptoms  none    Duration  Continue with 45 min of aerobic exercise without signs/symptoms of physical distress.    Intensity  THRR unchanged   use RPE     Progression   Progression  Continue to progress workloads to maintain intensity without signs/symptoms of physical distress.    Average METs  2      Resistance Training   Training Prescription  Yes    Weight  6 lb    Reps  10-15      Interval Training   Interval Training  No      Treadmill   MPH  1.1    Grade  0.5    Minutes  15    METs  1.92      NuStep   Level  3    SPM  80    Minutes  15    METs  2.3      REL-XR   Level  2    Speed  50    Minutes  15    METs  1.8       Nutrition:  Target Goals: Understanding of nutrition guidelines, daily intake of sodium '1500mg'$ , cholesterol '200mg'$ , calories 30% from fat and 7% or less from saturated fats, daily to have 5 or more servings of fruits and vegetables.  Biometrics: Pre Biometrics - 04/01/18 1604      Pre Biometrics   Height  '6\' 1"'$  (1.854 m)    Weight  229 lb 8 oz (104.1 kg)    Waist Circumference  44.5 inches    Hip Circumference  43 inches    Waist to Hip Ratio  1.03 %    BMI (Calculated)   30.29      Post Biometrics - 07/05/18 1054       Post  Biometrics   Height  '6\' 1"'$  (  1.854 m)    Weight  228 lb (103.4 kg)    Waist Circumference  44 inches    Hip Circumference  41 inches    Waist to Hip Ratio  1.07 %    BMI (Calculated)  30.09       Nutrition Therapy Plan and Nutrition Goals: Nutrition Therapy & Goals - 04/10/18 1250      Nutrition Therapy   Diet  DM    Protein (specify units)  12-13oz    Fiber  35 grams    Whole Grain Foods  3 servings   eats whole grains regularly   Saturated Fats  15 max. grams    Fruits and Vegetables  6 servings/day   8 ideal. eats fruits and vegetables regularly   Sodium  2000 grams   per MD recommendation, follows 2g sodium diet strictly     Personal Nutrition Goals   Nutrition Goal  Experiment with breakfast options that are a combination of carbohydrate + protein rather than predominantly carbohydrate in order to prevent hyperglycemia prior to exercise    Personal Goal #2  Continue to work with your wife to keep sodium intake between 1500 and '2000mg'$ /day. Great job for finding lower sodium ingredient swaps and recipes!    Comments  He experiences dramatic BG changes within a matter of minutes, often dropping from the 300 range to 60 or even as low as 40. His wife tightly monitor his diet and keeps sodium intake lower than '2000mg'$ /day. Pt typically eats 5 CHO servings per meal and 1-2 at snacks for a total of 17-18 CHO servings per day, which they state was recommended by endocrynologist. Wife weighs out foods and makes most meals from scratch using low sodium ingredients. They eat fruits and vegetables daily and monitor potassium intake in addition to sodium r/t CKD. They try to avoid foods with preservatives and try to buy meats from local farms. If they eat out, wife looks up nutrition information ahead of time. HgbA1c 7.7 "best it's been in years" 3 weeks ago per wife. Breakfast: shredded wheat cereal, oatmeal, oatmeal muffins, eggs /  sausage occasionally. Snacks: NSA Klondike bar, fruit, unsalted nuts. If he experiences hypoglycemia he corrects with applejuice which is effective.      Intervention Plan   Intervention  Prescribe, educate and counsel regarding individualized specific dietary modifications aiming towards targeted core components such as weight, hypertension, lipid management, diabetes, heart failure and other comorbidities.    Expected Outcomes  Short Term Goal: A plan has been developed with personal nutrition goals set during dietitian appointment.;Long Term Goal: Adherence to prescribed nutrition plan.;Short Term Goal: Understand basic principles of dietary content, such as calories, fat, sodium, cholesterol and nutrients.       Nutrition Assessments: Nutrition Assessments - 04/01/18 1407      MEDFICTS Scores   Pre Score  24       Nutrition Goals Re-Evaluation: Nutrition Goals Re-Evaluation    Niles Name 04/10/18 1332 05/08/18 1031 06/12/18 1445         Goals   Nutrition Goal  Continue to work with your wife to keep sodium intake between 1500 and '2000mg'$ /day. Great job for finding lower sodium ingredient swaps and recipes!  Continue to work with your wife to keep sodium intake between 1500 and '2000mg'$  / day; Experiment with breakfast options that are a combination of carbohydrate and protein rather than predominantly carbohydrate  lose a few pounds     Comment  His wife monitors his  diet tightly, weighing out foods, making recipes from scratch, reading labels, and looking up nutrition information at restaurants ahead of time  He and his wife have continued to monitor his diet closely and he reports that the regular exercise he is getting in the program has helped to improve BG control (IE prevented such large fluctuations). He has been eating breakfast options such as home made oatmeal muffins, oatmeal and occasionally eggs and toast. All options are low sodium and low sugar per his wife's cooking  Patient  states that his wife watches his diet and has no questions.     Expected Outcome  Continue to limit sodium intake, keeping below '2000mg'$ /day when possible  He will keep up a regular exercise routine for general health and to continue to improve BG control. Maintain low sugar and sodium diet  Short: exercise and diet to lose weight. Long: maintain diet and weight loss independently.       Personal Goal #2 Re-Evaluation   Personal Goal #2  Experiment with breakfast options that are a combination of carbohydrate and protein rather than predominantly carbohydrate in order to prevent hyperglycemia prior to exercise  -  -        Nutrition Goals Discharge (Final Nutrition Goals Re-Evaluation): Nutrition Goals Re-Evaluation - 06/12/18 1445      Goals   Nutrition Goal  lose a few pounds    Comment  Patient states that his wife watches his diet and has no questions.    Expected Outcome  Short: exercise and diet to lose weight. Long: maintain diet and weight loss independently.       Psychosocial: Target Goals: Acknowledge presence or absence of significant depression and/or stress, maximize coping skills, provide positive support system. Participant is able to verbalize types and ability to use techniques and skills needed for reducing stress and depression.   Initial Review & Psychosocial Screening: Initial Psych Review & Screening - 04/01/18 1357      Initial Review   Current issues with  Current Stress Concerns    Source of Stress Concerns  Chronic Illness    Comments  Going to the doctors and going to funerals make his stressed out. His breathing and there are things that he cant do anymore.      Family Dynamics   Good Support System?  Yes    Comments  His wife of 77 years is his support system.      Barriers   Psychosocial barriers to participate in program  There are no identifiable barriers or psychosocial needs.;The patient should benefit from training in stress management and  relaxation.      Screening Interventions   Interventions  Encouraged to exercise;Program counselor consult;Provide feedback about the scores to participant;To provide support and resources with identified psychosocial needs    Expected Outcomes  Short Term goal: Utilizing psychosocial counselor, staff and physician to assist with identification of specific Stressors or current issues interfering with healing process. Setting desired goal for each stressor or current issue identified.;Long Term Goal: Stressors or current issues are controlled or eliminated.;Short Term goal: Identification and review with participant of any Quality of Life or Depression concerns found by scoring the questionnaire.;Long Term goal: The participant improves quality of Life and PHQ9 Scores as seen by post scores and/or verbalization of changes       Quality of Life Scores:  Scores of 19 and below usually indicate a poorer quality of life in these areas.  A difference of  2-3  points is a clinically meaningful difference.  A difference of 2-3 points in the total score of the Quality of Life Index has been associated with significant improvement in overall quality of life, self-image, physical symptoms, and general health in studies assessing change in quality of life.  PHQ-9: Recent Review Flowsheet Data    Depression screen [facility] 2/9 04/22/2018 04/01/2018 07/16/2017 03/19/2017 11/15/2016   Decreased Interest 0 0 0 0 0   Down, Depressed, Hopeless 0 0 0 0 0   PHQ - 2 Score 0 0 0 0 0   Altered sleeping 0 0 - - -   Tired, decreased energy 0 1 - - -   Change in appetite 0 0 - - -   Feeling bad or failure about yourself  0 0 - - -   Trouble concentrating 0 0 - - -   Moving slowly or fidgety/restless 0 0 - - -   Suicidal thoughts 0 0 - - -   PHQ-9 Score 0 1 - - -   Difficult doing work/chores Not difficult at all Not difficult at all - - -     Interpretation of Total Score  Total Score Depression Severity:  1-4 = Minimal  depression, 5-9 = Mild depression, 10-14 = Moderate depression, 15-19 = Moderately severe depression, 20-27 = Severe depression   Psychosocial Evaluation and Intervention: Psychosocial Evaluation - 04/22/18 1118      Psychosocial Evaluation & Interventions   Interventions  Encouraged to exercise with the program and follow exercise prescription    Comments  Counselor follow up with Mr. Montefusco Simonetti) today much progress since coming into this program.  The blood flow to his right leg in particulary has improved significantly.  He has learned much from the education and particularly the dietician and nutritional aspects.  He continues to sleep well and manage his anxiety with medications and exercise.  He reports the staff here are extremely helpful and knowledgeable and he feels confident in their care.  Counselor commended Va on his progress made.  He has lost several pounds and that is quite an accomplishment with being the week after Thanksgiving!  Staff will continue to follow.    Expected Outcomes  Short:  Zayveon will continue to exercise to improve his blood flow; decrease his anxiety and for his overall health.  Long:  Peirce will continue to make positive nutritional choices for his weight loss goal.     Continue Psychosocial Services   Follow up required by staff       Psychosocial Re-Evaluation: Psychosocial Re-Evaluation    Lott Name 05/29/18 1414 06/05/18 0912 06/12/18 1115         Psychosocial Re-Evaluation   Current issues with  Current Stress Concerns  Current Psychotropic Meds;Current Stress Concerns  -     Comments  Marl states that there is nothing new to report with his mental health. He states that sometimes he is able to exercise well on the treadmill with his right calve and sometimes it really bothers him. He has a positive outlook and is doing very well in the program.  Counselor follow up with Orlander today reporting he is making some progress on his goals to lose some  weight.  He also is enjoying the exercise program and is sleeping well.  His anxiety has improved as well with only taking the medications every 3-4 days now.  Nijah reports he is coping better with stress now and he and his spouse are planning to  join the gym and work out together upon completion of this program.  Counselor commended Bayard for his progress made and his commitment to positive self-care.  Staff will follow with him.    Counselor met briefly today with Zamar stating he continues to do well while in this program.  He is positive; sleeping well; and managing his stress better.  He appears to join the social aspect of this program and is a positive participant in groups.  counselor commended Fritz Pickerel for all of his progress made while in this program.       Expected Outcomes  Short: Attend [facility] stress management education to decrease stress. Long: Maintain exercise Post [facility] to keep stress at a minimum.  Short:  Rogan will continue to exercise for his health and mental health - stress management and continued weight loss.  Long:  Dalin will develop a routine of healthy self-care to cope with life stressors.    Short:  Wallice will exercise consistently for his health and to manage his stress.  Long:  Jefte will continue to exercise and eat more healthfully for his long -term health needs.      Interventions  Encouraged to attend Pulmonary Rehabilitation for the exercise  -  -     Continue Psychosocial Services   Follow up required by staff  -  -        Psychosocial Discharge (Final Psychosocial Re-Evaluation): Psychosocial Re-Evaluation - 06/12/18 1115      Psychosocial Re-Evaluation   Comments  Counselor met briefly today with Fritz Pickerel stating he continues to do well while in this program.  He is positive; sleeping well; and managing his stress better.  He appears to join the social aspect of this program and is a positive participant in groups.  counselor commended Fritz Pickerel for all of his  progress made while in this program.      Expected Outcomes  Short:  Kache will exercise consistently for his health and to manage his stress.  Long:  Jeret will continue to exercise and eat more healthfully for his long -term health needs.        Education: Education Goals: Education classes will be provided on a weekly basis, covering required topics. Participant will state understanding/return demonstration of topics presented.  Learning Barriers/Preferences: Learning Barriers/Preferences - 04/01/18 1402      Learning Barriers/Preferences   Learning Barriers  Sight   wears glasses   Learning Preferences  None       Education Topics:  Initial Evaluation Education: - Verbal, written and demonstration of respiratory meds, oximetry and breathing techniques. Instruction on use of nebulizers and MDIs and importance of monitoring MDI activations.   Pulmonary Rehab from 07/10/2018 in [facility] Cardiac and Pulmonary Rehab  Date  04/01/18  Educator  [facility]  Instruction Review Code  1- Verbalizes Understanding      General Nutrition Guidelines/Fats and Fiber: -Group instruction provided by verbal, written material, models and posters to present the general guidelines for heart healthy nutrition. Gives an explanation and review of dietary fats and fiber.   Pulmonary Rehab from 07/10/2018 in [facility] Cardiac and Pulmonary Rehab  Date  07/03/18  Educator  [facility]  Instruction Review Code  1- Verbalizes Understanding      Controlling Sodium/Reading Food Labels: -Group verbal and written material supporting the discussion of sodium use in heart healthy nutrition. Review and explanation with models, verbal and written materials for utilization of the food label.   Pulmonary Rehab from 07/10/2018 in [facility]  Cardiac and Pulmonary Rehab  Date  07/10/18  Educator  [facility]  Instruction Review Code  1- Verbalizes Understanding      Exercise Physiology & General Exercise Guidelines: - Group verbal and written  instruction with models to review the exercise physiology of the cardiovascular system and associated critical values. Provides general exercise guidelines with specific guidelines to those with heart or lung disease.    Pulmonary Rehab from 07/10/2018 in [facility] Cardiac and Pulmonary Rehab  Date  06/12/18  Educator  [facility]  Instruction Review Code  1- Verbalizes Understanding      Aerobic Exercise & Resistance Training: - Gives group verbal and written instruction on the various components of exercise. Focuses on aerobic and resistive training programs and the benefits of this training and how to safely progress through these programs.   Pulmonary Rehab from 07/10/2018 in [facility] Cardiac and Pulmonary Rehab  Date  06/14/18  Educator  [facility]  Instruction Review Code  1- Verbalizes Understanding      Flexibility, Balance, Mind/Body Relaxation: Provides group verbal/written instruction on the benefits of flexibility and balance training, including mind/body exercise modes such as yoga, pilates and tai chi.  Demonstration and skill practice provided.   Pulmonary Rehab from 07/10/2018 in [facility] Cardiac and Pulmonary Rehab  Date  06/19/18  Educator  [facility]  Instruction Review Code  1- Verbalizes Understanding      Stress and Anxiety: - Provides group verbal and written instruction about the health risks of elevated stress and causes of high stress.  Discuss the correlation between heart/lung disease and anxiety and treatment options. Review healthy ways to manage with stress and anxiety.   Pulmonary Rehab from 07/10/2018 in [place] [facility] Re[facility]  Date  06/26/18  Educator  [facility]  Instruction Review Code  1- Verbalizes Understanding      Depression: - Provides group verbal and written instruction on the correlation between heart/lung disease and depressed mood, treatment options, and the stigmas associated with seeking treatment.   Exercise & Equipment Safety: - Individual verbal instruction and  demonstration of equipment use and safety with use of the equipment.   Pulmonary Rehab from 07/10/2018 in [facility] Cardiac and Pulmonary Rehab  Date  04/01/18  Educator  [facility]  Instruction Review Code  1- Verbalizes Understanding      Infection Prevention: - Provides verbal and written material to individual with discussion of infection control including proper hand washing and proper equipment cleaning during exercise session.   Pulmonary Rehab from 07/10/2018 in [facility] Pulmonary Re[facility]  Date  04/01/18  Educator  [facility]  Instruction Review Code  1- Verbalizes Understanding      Falls Prevention: - Provides verbal and written material to individual with discussion of falls prevention and safety.   Pulmonary Rehab from 07/10/2018 in [facility] Cardiac and Pulmonary Rehab  Date  04/01/18  Educator  [facility]  Instruction Review Code  1- Verbalizes Understanding      Diabetes: - Individual verbal and written instruction to review signs/symptoms of diabetes, desired ranges of glucose level fasting, after meals and with exercise. Advice that pre and post exercise glucose checks will be done for 3 sessions at entry of program.   Chronic Lung Diseases: - Group verbal and written instruction to review updates, respiratory medications, advancements in procedures and treatments. Discuss use of supplemental oxygen including available portable oxygen systems, continuous and intermittent flow rates, concentrators, personal use and safety guidelines. Review proper use of inhaler and spacers. Provide informative websites for  self-education.    Pulmonary Rehab from 07/10/2018 in [facility] Cardiac and Pulmonary Rehab  Date  06/28/18  Educator  [facility]  Instruction Review Code  1- Verbalizes Understanding      Energy Conservation: - Provide group verbal and written instruction for methods to conserve energy, plan and organize activities. Instruct on pacing techniques, use of adaptive equipment and posture/positioning to  relieve shortness of breath.   Triggers and Exacerbations: - Group verbal and written instruction to review types of environmental triggers and ways to prevent exacerbations. Discuss weather changes, air quality and the benefits of nasal washing. Review warning signs and symptoms to help prevent infections. Discuss techniques for effective airway clearance, coughing, and vibrations.   Pulmonary Rehab from 07/10/2018 in [facility] Cardiac and Pulmonary Rehab  Date  05/10/18  Educator  [facility]  Instruction Review Code  1- Verbalizes Understanding      AED/CPR: - Group verbal and written instruction with the use of models to demonstrate the basic use of the AED with the basic ABC's of resuscitation.   Anatomy and Physiology of the Lungs: - Group verbal and written instruction with the use of models to provide basic lung anatomy and physiology related to function, structure and complications of lung disease.   Pulmonary Rehab from 07/10/2018 in [facility] Cardiac and Pulmonary Rehab  Date  04/05/18  Educator  [facility]  Instruction Review Code  1- Verbalizes Understanding      Anatomy & Physiology of the Heart: - Group verbal and written instruction and models provide basic cardiac anatomy and physiology, with the coronary electrical and arterial systems. Review of Valvular disease and Heart Failure   Pulmonary Rehab from 07/10/2018 in [facility] Cardiac and Pulmonary Rehab  Date  05/24/18  Educator  [facility]  Instruction Review Code  1- Verbalizes Understanding      Cardiac Medications: - Group verbal and written instruction to review commonly prescribed medications for heart disease. Reviews the medication, class of the drug, and side effects.   Pulmonary Rehab from 07/10/2018 in [facility] Cardiac and Pulmonary Rehab  Date  05/31/18  Educator  [facility]  Instruction Review Code  1- Verbalizes Understanding      Know Your Numbers and Risk Factors: -Group verbal and written instruction about important numbers in your health.   Discussion of what are risk factors and how they play a role in the disease process.  Review of Cholesterol, Blood Pressure, Diabetes, and BMI and the role they play in your overall health.   Pulmonary Rehab from 07/10/2018 in [facility] Cardiac and Pulmonary Rehab  Date  05/08/18  Educator  [facility]  Instruction Review Code  1- Verbalizes Understanding      Sleep Hygiene: -Provides group verbal and written instruction about how sleep can affect your health.  Define sleep hygiene, discuss sleep cycles and impact of sleep habits. Review good sleep hygiene tips.    Pulmonary Rehab from 07/10/2018 in [facility] Cardiac and Pulmonary Rehab  Date  05/29/18  Educator  [facility]  Instruction Review Code  1- Verbalizes Understanding      Other: -Provides group and verbal instruction on various topics (see comments)    Knowledge Questionnaire Score: Knowledge Questionnaire Score - 07/01/18 1027      Knowledge Questionnaire Score   Pre Score  15/18    Post Score  15/18   reviewed with patient       Core Components/Risk Factors/Patient Goals at Admission: Personal Goals and Risk Factors at Admission - 04/01/18 1404  Core Components/Risk Factors/Patient Goals on Admission    Weight Management  Yes;Weight Loss    Intervention  Weight Management: Develop a combined nutrition and exercise program designed to reach desired caloric intake, while maintaining appropriate intake of nutrient and fiber, sodium and fats, and appropriate energy expenditure required for the weight goal.;Weight Management: Provide education and appropriate resources to help participant work on and attain dietary goals.;Weight Management/Obesity: Establish reasonable short term and long term weight goals.    Admit Weight  229 lb 8 oz (104.1 kg)    Goal Weight: Short Term  225 lb (102.1 kg)    Goal Weight: Long Term  215 lb (97.5 kg)    Expected Outcomes  Short Term: Continue to assess and modify interventions until short term weight is  achieved;Long Term: Adherence to nutrition and physical activity/exercise program aimed toward attainment of established weight goal;Weight Loss: Understanding of general recommendations for a balanced deficit meal plan, which promotes 1-2 lb weight loss per week and includes a negative energy balance of [phone] kcal/d;Understanding recommendations for meals to include 15-35% energy as protein, 25-35% energy from fat, 35-60% energy from carbohydrates, less than '200mg'$  of dietary cholesterol, 20-35 gm of total fiber daily;Understanding of distribution of calorie intake throughout the day with the consumption of 4-5 meals/snacks    Improve shortness of breath with ADL's  Yes    Intervention  Provide education, individualized exercise plan and daily activity instruction to help decrease symptoms of SOB with activities of daily living.    Expected Outcomes  Short Term: Improve cardiorespiratory fitness to achieve a reduction of symptoms when performing ADLs;Long Term: Be able to perform more ADLs without symptoms or delay the onset of symptoms    Diabetes  Yes    Intervention  Provide education about signs/symptoms and action to take for hypo/hyperglycemia.;Provide education about proper nutrition, including hydration, and aerobic/resistive exercise prescription along with prescribed medications to achieve blood glucose in normal ranges: Fasting glucose 65-99 mg/dL    Expected Outcomes  Short Term: Participant verbalizes understanding of the signs/symptoms and immediate care of hyper/hypoglycemia, proper foot care and importance of medication, aerobic/resistive exercise and nutrition plan for blood glucose control.;Long Term: Attainment of HbA1C < 7%.    Heart Failure  Yes    Intervention  Provide a combined exercise and nutrition program that is supplemented with education, support and counseling about heart failure. Directed toward relieving symptoms such as shortness of breath, decreased exercise tolerance,  and extremity edema.    Expected Outcomes  Improve functional capacity of life;Short term: Attendance in program 2-3 days a week with increased exercise capacity. Reported lower sodium intake. Reported increased fruit and vegetable intake. Reports medication compliance.;Short term: Daily weights obtained and reported for increase. Utilizing diuretic protocols set by physician.;Long term: Adoption of self-care skills and reduction of barriers for early signs and symptoms recognition and intervention leading to self-care maintenance.    Hypertension  Yes    Intervention  Provide education on lifestyle modifcations including regular physical activity/exercise, weight management, moderate sodium restriction and increased consumption of fresh fruit, vegetables, and low fat dairy, alcohol moderation, and smoking cessation.;Monitor prescription use compliance.    Expected Outcomes  Short Term: Continued assessment and intervention until BP is < 140/39m HG in hypertensive participants. < 130/862mHG in hypertensive participants with diabetes, heart failure or chronic kidney disease.;Long Term: Maintenance of blood pressure at goal levels.    Lipids  Yes    Intervention  Provide education and support for  participant on nutrition & aerobic/resistive exercise along with prescribed medications to achieve LDL '70mg'$ , HDL >'40mg'$ .    Expected Outcomes  Short Term: Participant states understanding of desired cholesterol values and is compliant with medications prescribed. Participant is following exercise prescription and nutrition guidelines.;Long Term: Cholesterol controlled with medications as prescribed, with individualized exercise RX and with personalized nutrition plan. Value goals: LDL < '70mg'$ , HDL > 40 mg.       Core Components/Risk Factors/Patient Goals Review:  Goals and Risk Factor Review    Row Name 04/15/18 1458 05/29/18 1417 06/17/18 1632         Core Components/Risk Factors/Patient Goals Review    Personal Goals Review  Lipids;Heart Failure;Improve shortness of breath with ADL's;Weight Management/Obesity;Diabetes  Lipids;Heart Failure;Improve shortness of breath with ADL's;Weight Management/Obesity;Diabetes  Lipids;Heart Failure;Improve shortness of breath with ADL's;Weight Management/Obesity;Diabetes     Review  Bill states that his blood flow is not good to his right leg. Sometimes he has to take a break on the treadmill because it get painful. He is checking his sugar at home and here in the program. When he feels like he is getting low sometimes he likes to be checked. He brings apple juice incase his sugar is low after class. We will check his machine against ours if he would like to use his.  Patient has been maintaing his weight and his diabetes is under control. He still has issues with his blood flow in his right calve but is working hard to better his health. He has been getting more stamina to do his ADL's.   Cai has been doing well in thr program and will continue to exercise after [facility]. He wants to be able to walk with his vascular issues in his right calve as much as he can. He sees his doctor regularly and know what he has to do.     Expected Outcomes  Short: Attend [facility] regularly to improve shortness of breath with ADL's. Long: maintain independence with ADL's   Short: maintain attendance in [place]. Short: graduate [facility] and continue exercising.  Short: graduate [place]. Long: Maintain exercise and ADLs post [facility].        Core Components/Risk Factors/Patient Goals at Discharge (Final Review):  Goals and Risk Factor Review - 06/17/18 1632      Core Components/Risk Factors/Patient Goals Review   Personal Goals Review  Lipids;Heart Failure;Improve shortness of breath with ADL's;Weight Management/Obesity;Diabetes    Review  Feliz has been doing well in thr program and will continue to exercise after [facility]. He wants to be able to walk with his vascular  issues in his right calve as much as he can. He sees his doctor regularly and know what he has to do.    Expected Outcomes  Short: graduate [place]. Long: Maintain exercise and ADLs post [facility].       ITP Comments: ITP Comments    Row Name 04/01/18 1345 04/08/18 0836 05/06/18 0829 06/03/18 0835 07/01/18 0825   ITP Comments  Medical Evaluation completed. Chart sent for review and changes to Dr. Emily Filbert Director of [place]. Diagnosis can be found in [facility] encounter 03/21/2018  30 day review completed. ITP sent to Dr. Emily Filbert Director of [place]. Continue with ITP unless changes are made by physician.  30 day review completed. ITP sent to Dr. Emily Filbert Director of [place]. Continue with ITP unless changes are made by physician.  30 day review completed. ITP sent to Dr. Emily Filbert Director of [place]. Continue with  ITP unless changes are made by physician.  30 day review completed. ITP sent to Dr. Emily Filbert Director of [place]. Continue with ITP unless changes are made by physician.      Comments: discharge ITP

## 2018-07-15 NOTE — Patient Instructions (Addendum)
Discharge Patient Instructions  Patient Details  Name: Ernest Mclean MRN: [id number] Date of Birth: Oct 03, 1940 Referring Provider:  Flora Lipps, MD   Number of Visits: 31  Reason for Discharge:  Patient reached a stable level of exercise. Patient independent in their exercise. Patient has met program and personal goals.  Smoking History:  Social History   Tobacco Use  Smoking Status Former Smoker  . Packs/day: 1.00  . Years: 30.00  . Pack years: 30.00  . Types: Cigarettes  . Last attempt to quit: 12/14/2001  . Years since quitting: 16.5  Smokeless Tobacco Never Used  Tobacco Comment   quit 12/14/2001    Diagnosis:  Chronic obstructive pulmonary disease, unspecified COPD type [facility])  Initial Exercise Prescription: Initial Exercise Prescription - 04/01/18 1600      Date of Initial Exercise RX and Referring Provider   Date  04/01/18    Referring Provider  Kasa      Treadmill   MPH  1    Grade  0    Minutes  15   as tolerated - stop when needed to rest   METs  1.77      NuStep   Level  1    SPM  80    Minutes  15    METs  1.5      REL-XR   Level  1    Speed  50    Minutes  15    METs  1.5      Prescription Details   Frequency (times per week)  3    Duration  Progress to 45 minutes of aerobic exercise without signs/symptoms of physical distress      Intensity   THRR 40-80% of Max Heartrate  94-128    Ratings of Perceived Exertion  11-13    Perceived Dyspnea  0-4      Resistance Training   Training Prescription  Yes    Weight  3 lb    Reps  10-15       Discharge Exercise Prescription (Final Exercise Prescription Changes): Exercise Prescription Changes - 07/02/18 0900      Response to Exercise   Blood Pressure (Admit)  110/60    Blood Pressure (Exit)  132/60    Heart Rate (Admit)  62 bpm    Heart Rate (Exercise)  104 bpm    Heart Rate (Exit)  67 bpm    Oxygen Saturation (Admit)  92 %    Oxygen Saturation (Exercise)  90 %    Oxygen  Saturation (Exit)  93 %    Rating of Perceived Exertion (Exercise)  13    Perceived Dyspnea (Exercise)  2    Symptoms  none    Duration  Continue with 45 min of aerobic exercise without signs/symptoms of physical distress.    Intensity  THRR unchanged   use RPE     Progression   Progression  Continue to progress workloads to maintain intensity without signs/symptoms of physical distress.    Average METs  2      Resistance Training   Training Prescription  Yes    Weight  6 lb    Reps  10-15      Interval Training   Interval Training  No      Treadmill   MPH  1.1    Grade  0.5    Minutes  15    METs  1.92      NuStep   Level  3  SPM  80    Minutes  15    METs  2.3      REL-XR   Level  2    Speed  50    Minutes  15    METs  1.8       Functional Capacity: 6 Minute Walk    Row Name 04/01/18 1606 07/05/18 1046       6 Minute Walk   Phase  Initial  Discharge    Distance  733 feet  1000 feet    Distance % Change  -  36.42 %    Distance Feet Change  -  267 ft    Walk Time  6 minutes  5.8 minutes    # of Rest Breaks  0  1    MPH  1.39  1.9    METS  1.45  1.7    RPE  13  13    Perceived Dyspnea   0  1    VO2 Peak  5.06  5.91    Symptoms  Yes (comment)  Yes (comment)    Comments  R calf pain  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 He experiences this regularly.    Resting HR  61 bpm  62 bpm    Resting BP  148/64  140/80    Resting Oxygen Saturation   95 %  93 %    Exercise Oxygen Saturation  during 6 min walk  92 %  90 %    Max Ex. HR  76 bpm  60 bpm    Max Ex. BP  156/60  138/60    2 Minute Post BP  142/64  126/70      Interval HR   1 Minute HR  59  -    2 Minute HR  58  -    3 Minute HR  61  -    5 Minute HR  65  -    6 Minute HR  76  -    2 Minute Post HR  59  -    Interval Heart Rate?  Yes  -      Interval Oxygen   Interval Oxygen?  Yes  -    Baseline Oxygen Saturation %  95 %  -    1 Minute Oxygen Saturation %  93 %  -    1 Minute  Liters of Oxygen  0 L  -    2 Minute Oxygen Saturation %  94 %  -    2 Minute Liters of Oxygen  0 L  -    3 Minute Oxygen Saturation %  93 %  -    3 Minute Liters of Oxygen  0 L  -    4 Minute Liters of Oxygen  0 L  -    5 Minute Oxygen Saturation %  92 %  -    5 Minute Liters of Oxygen  0 L  -    6 Minute Oxygen Saturation %  94 %  -    6 Minute Liters of Oxygen  0 L  -    2 Minute Post Oxygen Saturation %  96 %  -    2 Minute Post Liters of Oxygen  0 L  -       Quality of Life:   Personal Goals: Goals established at orientation with interventions provided to work toward goal. Personal Goals and Risk Factors at Admission -  04/01/18 1404      Core Components/Risk Factors/Patient Goals on Admission    Weight Management  Yes;Weight Loss    Intervention  Weight Management: Develop a combined nutrition and exercise program designed to reach desired caloric intake, while maintaining appropriate intake of nutrient and fiber, sodium and fats, and appropriate energy expenditure required for the weight goal.;Weight Management: Provide education and appropriate resources to help participant work on and attain dietary goals.;Weight Management/Obesity: Establish reasonable short term and long term weight goals.    Admit Weight  229 lb 8 oz (104.1 kg)    Goal Weight: Short Term  225 lb (102.1 kg)    Goal Weight: Long Term  215 lb (97.5 kg)    Expected Outcomes  Short Term: Continue to assess and modify interventions until short term weight is achieved;Long Term: Adherence to nutrition and physical activity/exercise program aimed toward attainment of established weight goal;Weight Loss: Understanding of general recommendations for a balanced deficit meal plan, which promotes 1-2 lb weight loss per week and includes a negative energy balance of [phone] kcal/d;Understanding recommendations for meals to include 15-35% energy as protein, 25-35% energy from fat, 35-60% energy from carbohydrates, less than  265m of dietary cholesterol, 20-35 gm of total fiber daily;Understanding of distribution of calorie intake throughout the day with the consumption of 4-5 meals/snacks    Improve shortness of breath with ADL's  Yes    Intervention  Provide education, individualized exercise plan and daily activity instruction to help decrease symptoms of SOB with activities of daily living.    Expected Outcomes  Short Term: Improve cardiorespiratory fitness to achieve a reduction of symptoms when performing ADLs;Long Term: Be able to perform more ADLs without symptoms or delay the onset of symptoms    Diabetes  Yes    Intervention  Provide education about signs/symptoms and action to take for hypo/hyperglycemia.;Provide education about proper nutrition, including hydration, and aerobic/resistive exercise prescription along with prescribed medications to achieve blood glucose in normal ranges: Fasting glucose 65-99 mg/dL    Expected Outcomes  Short Term: Participant verbalizes understanding of the signs/symptoms and immediate care of hyper/hypoglycemia, proper foot care and importance of medication, aerobic/resistive exercise and nutrition plan for blood glucose control.;Long Term: Attainment of HbA1C < 7%.    Heart Failure  Yes    Intervention  Provide a combined exercise and nutrition program that is supplemented with education, support and counseling about heart failure. Directed toward relieving symptoms such as shortness of breath, decreased exercise tolerance, and extremity edema.    Expected Outcomes  Improve functional capacity of life;Short term: Attendance in program 2-3 days a week with increased exercise capacity. Reported lower sodium intake. Reported increased fruit and vegetable intake. Reports medication compliance.;Short term: Daily weights obtained and reported for increase. Utilizing diuretic protocols set by physician.;Long term: Adoption of self-care skills and reduction of barriers for early signs and  symptoms recognition and intervention leading to self-care maintenance.    Hypertension  Yes    Intervention  Provide education on lifestyle modifcations including regular physical activity/exercise, weight management, moderate sodium restriction and increased consumption of fresh fruit, vegetables, and low fat dairy, alcohol moderation, and smoking cessation.;Monitor prescription use compliance.    Expected Outcomes  Short Term: Continued assessment and intervention until BP is < 140/975mHG in hypertensive participants. < 130/8054mG in hypertensive participants with diabetes, heart failure or chronic kidney disease.;Long Term: Maintenance of blood pressure at goal levels.    Lipids  Yes  Intervention  Provide education and support for participant on nutrition & aerobic/resistive exercise along with prescribed medications to achieve LDL <65m, HDL >457m    Expected Outcomes  Short Term: Participant states understanding of desired cholesterol values and is compliant with medications prescribed. Participant is following exercise prescription and nutrition guidelines.;Long Term: Cholesterol controlled with medications as prescribed, with individualized exercise RX and with personalized nutrition plan. Value goals: LDL < 7030mHDL > 40 mg.        Personal Goals Discharge: Goals and Risk Factor Review - 06/17/18 1632      Core Components/Risk Factors/Patient Goals Review   Personal Goals Review  Lipids;Heart Failure;Improve shortness of breath with ADL's;Weight Management/Obesity;Diabetes    Review  LarKeydens been doing well in thr program and will continue to exercise after [facility]. He wants to be able to walk with his vascular issues in his right calve as much as he can. He sees his doctor regularly and know what he has to do.    Expected Outcomes  Short: [facility]: Maintain exercise and ADLs post [facility].       Exercise Goals and Review: Exercise Goals    Row Name 04/01/18  1605             Exercise Goals   Increase Physical Activity  Yes       Intervention  Provide advice, education, support and counseling about physical activity/exercise needs.;Develop an individualized exercise prescription for aerobic and resistive training based on initial evaluation findings, risk stratification, comorbidities and participant's personal goals.       Expected Outcomes  Short Term: Attend rehab on a regular basis to increase amount of physical activity.;Long Term: Add in home exercise to make exercise part of routine and to increase amount of physical activity.;Long Term: Exercising regularly at least 3-5 days a week.       Increase Strength and Stamina  Yes       Intervention  Provide advice, education, support and counseling about physical activity/exercise needs.;Develop an individualized exercise prescription for aerobic and resistive training based on initial evaluation findings, risk stratification, comorbidities and participant's personal goals.       Expected Outcomes  Short Term: Increase workloads from initial exercise prescription for resistance, speed, and METs.;Short Term: Perform resistance training exercises routinely during rehab and add in resistance training at home;Long Term: Improve cardiorespiratory fitness, muscular endurance and strength as measured by increased METs and functional capacity (6MWT)       Able to understand and use rate of perceived exertion (RPE) scale  Yes       Intervention  Provide education and explanation on how to use RPE scale       Expected Outcomes  Long Term:  Able to use RPE to guide intensity level when exercising independently;Short Term: Able to use RPE daily in rehab to express subjective intensity level       Able to understand and use Dyspnea scale  Yes       Intervention  Provide education and explanation on how to use Dyspnea scale       Expected Outcomes  Short Term: Able to use Dyspnea scale daily in rehab to express  subjective sense of shortness of breath during exertion;Long Term: Able to use Dyspnea scale to guide intensity level when exercising independently       Knowledge and understanding of Target Heart Rate Range (THRR)  Yes       Intervention  Provide education and explanation  of THRR including how the numbers were predicted and where they are located for reference       Expected Outcomes  Short Term: Able to state/look up THRR;Short Term: Able to use daily as guideline for intensity in rehab;Long Term: Able to use THRR to govern intensity when exercising independently       Able to check pulse independently  Yes       Intervention  Provide education and demonstration on how to check pulse in carotid and radial arteries.;Review the importance of being able to check your own pulse for safety during independent exercise       Expected Outcomes  Short Term: Able to explain why pulse checking is important during independent exercise;Long Term: Able to check pulse independently and accurately       Understanding of Exercise Prescription  Yes       Intervention  Provide education, explanation, and written materials on patient's individual exercise prescription       Expected Outcomes  Short Term: Able to explain program exercise prescription;Long Term: Able to explain home exercise prescription to exercise independently          Exercise Goals Re-Evaluation: Exercise Goals Re-Evaluation    Row Name 04/05/18 1016 04/11/18 0851 04/24/18 1051 04/25/18 0946 05/08/18 1231     Exercise Goal Re-Evaluation   Exercise Goals Review  Able to understand and use rate of perceived exertion (RPE) scale;Able to understand and use Dyspnea scale;Knowledge and understanding of Target Heart Rate Range (THRR);Understanding of Exercise Prescription  Increase Physical Activity;Increase Strength and Stamina;Able to understand and use rate of perceived exertion (RPE) scale;Able to understand and use Dyspnea scale  Increase Physical  Activity;Able to understand and use rate of perceived exertion (RPE) scale;Knowledge and understanding of Target Heart Rate Range (THRR);Understanding of Exercise Prescription;Increase Strength and Stamina;Able to understand and use Dyspnea scale;Able to check pulse independently  Increase Physical Activity;Increase Strength and Stamina;Able to understand and use rate of perceived exertion (RPE) scale;Able to understand and use Dyspnea scale;Knowledge and understanding of Target Heart Rate Range (THRR);Able to check pulse independently;Understanding of Exercise Prescription  Increase Physical Activity;Able to understand and use rate of perceived exertion (RPE) scale;Knowledge and understanding of Target Heart Rate Range (THRR);Understanding of Exercise Prescription;Increase Strength and Stamina;Able to understand and use Dyspnea scale   Comments  Reviewed RPE scale, THR and program prescription with pt today.  Pt voiced understanding and was given a copy of goals to take home.   Aul has tolerated exercise well in first sessions.  Staff will monitor progress.   Reviewed home exercise with pt today.  Pt plans to walk at home for exercise.  Reviewed THR, pulse, RPE, sign and symptoms, NTG use, and when to call 911 or MD.  Also discussed weather considerations and indoor options.  Pt voiced understanding.  Yecheskel increased levels on NS and plasn to increase weight strength training next session.    Katelyn increased resistance training weight up to 6 lbs. Continue to monitor progression.   Expected Outcomes  Short: Use RPE daily to regulate intensity. Long: Follow program prescription in THR.  Short - attend consistently Long - increase overall MET level  Short - increase walking time at home from 15-30 min Long - maintain exercise on his own  Short - increase to 6 lb weights for strength Long - increase MET level  Short- increase level on NS. Long- Increase MET level and make exercise a permanent commitment.   Andrews  Name 05/21/18  1324 06/05/18 1320 06/20/18 0912 07/02/18 0949       Exercise Goal Re-Evaluation   Exercise Goals Review  Increase Physical Activity;Increase Strength and Stamina;Able to understand and use rate of perceived exertion (RPE) scale;Able to understand and use Dyspnea scale;Knowledge and understanding of Target Heart Rate Range (THRR);Understanding of Exercise Prescription  Increase Physical Activity;Increase Strength and Stamina;Able to understand and use rate of perceived exertion (RPE) scale;Able to understand and use Dyspnea scale;Understanding of Exercise Prescription  Increase Physical Activity;Increase Strength and Stamina;Able to understand and use rate of perceived exertion (RPE) scale;Able to understand and use Dyspnea scale;Knowledge and understanding of Target Heart Rate Range (THRR);Understanding of Exercise Prescription  Increase Physical Activity;Increase Strength and Stamina;Able to understand and use rate of perceived exertion (RPE) scale;Able to understand and use Dyspnea scale;Knowledge and understanding of Target Heart Rate Range (THRR);Understanding of Exercise Prescription    Comments  Jerimey tolerates exercise well and works in correct RPE range.  Staff will monitor progress.    Avon has increased speed and grade on TM.  he is up to 6 lb weights for strength work.    Boris consistently works at [employer].  He is able to complete 45 min continuous exercise.  he attends LW consistently.  Harlo has increased level on REL.  He consistently works at [employer].      Expected Outcomes  Short - increase level on TM Long - increase overall MET level  Short - continue to progress workloads Long - improve MET level  Short - continue to attend consistently Long - increase MET level  Short - complete LW program Long - maintain exercise on his own       Nutrition & Weight - Outcomes: Pre Biometrics - 04/01/18 1604      Pre Biometrics   Height  6' 1"  (1.854 m)    Weight  229 lb 8 oz (104.1  kg)    Waist Circumference  44.5 inches    Hip Circumference  43 inches    Waist to Hip Ratio  1.03 %    BMI (Calculated)  30.29      Post Biometrics - 07/05/18 1054       Post  Biometrics   Height  6' 1"  (1.854 m)    Weight  228 lb (103.4 kg)    Waist Circumference  44 inches    Hip Circumference  41 inches    Waist to Hip Ratio  1.07 %    BMI (Calculated)  30.09       Nutrition: Nutrition Therapy & Goals - 04/10/18 1250      Nutrition Therapy   Diet  DM    Protein (specify units)  12-13oz    Fiber  35 grams    Whole Grain Foods  3 servings   eats whole grains regularly   Saturated Fats  15 max. grams    Fruits and Vegetables  6 servings/day   8 ideal. eats fruits and vegetables regularly   Sodium  2000 grams   per MD recommendation, follows 2g sodium diet strictly     Personal Nutrition Goals   Nutrition Goal  Experiment with breakfast options that are a combination of carbohydrate + protein rather than predominantly carbohydrate in order to prevent hyperglycemia prior to exercise    Personal Goal #2  Continue to work with your wife to keep sodium intake between 1500 and 2064m/day. Great job for finding lower sodium ingredient swaps and recipes!    Comments  He experiences dramatic BG changes within a matter of minutes, often dropping from the 300 range to 60 or even as low as 40. His wife tightly monitor his diet and keeps sodium intake lower than 2011m/day. Pt typically eats 5 CHO servings per meal and 1-2 at snacks for a total of 17-18 CHO servings per day, which they state was recommended by endocrynologist. Wife weighs out foods and makes most meals from scratch using low sodium ingredients. They eat fruits and vegetables daily and monitor potassium intake in addition to sodium r/t CKD. They try to avoid foods with preservatives and try to buy meats from local farms. If they eat out, wife looks up nutrition information ahead of time. HgbA1c 7.7 "best it's been in years"  3 weeks ago per wife. Breakfast: shredded wheat cereal, oatmeal, oatmeal muffins, eggs / sausage occasionally. Snacks: NSA Klondike bar, fruit, unsalted nuts. If he experiences hypoglycemia he corrects with applejuice which is effective.      Intervention Plan   Intervention  Prescribe, educate and counsel regarding individualized specific dietary modifications aiming towards targeted core components such as weight, hypertension, lipid management, diabetes, heart failure and other comorbidities.    Expected Outcomes  Short Term Goal: A plan has been developed with personal nutrition goals set during dietitian appointment.;Long Term Goal: Adherence to prescribed nutrition plan.;Short Term Goal: Understand basic principles of dietary content, such as calories, fat, sodium, cholesterol and nutrients.       Nutrition Discharge: Nutrition Assessments - 04/01/18 1407      MEDFICTS Scores   Pre Score  24       Education Questionnaire Score: Knowledge Questionnaire Score - 07/01/18 1027      Knowledge Questionnaire Score   Pre Score  15/18    Post Score  15/18   reviewed with patient      Goals reviewed with patient; copy given to patient.

## 2018-09-10 ENCOUNTER — Encounter: Admission: RE | Payer: Self-pay | Source: Home / Self Care

## 2018-09-10 ENCOUNTER — Ambulatory Visit: Admission: RE | Admit: 2018-09-10 | Payer: Medicare Other | Source: Home / Self Care | Admitting: Gastroenterology

## 2018-09-10 SURGERY — COLONOSCOPY WITH PROPOFOL
Anesthesia: General

## 2018-10-22 ENCOUNTER — Telehealth: Payer: Self-pay | Admitting: Internal Medicine

## 2018-10-22 MED ORDER — FLUTICASONE-SALMETEROL 500-50 MCG/DOSE IN AEPB: INHALATION_SPRAY | RESPIRATORY_TRACT | 1 refills | Status: DC

## 2018-10-22 NOTE — Telephone Encounter (Signed)
Called and spoke to pt, who is requesting Rx for Wixela 500.  Rx has been sent to preferred pharmacy, as pt was instructed to continue this medication at last OV. Nothing further is needed.

## 2018-11-04 ENCOUNTER — Telehealth: Payer: Self-pay | Admitting: Internal Medicine

## 2018-11-04 NOTE — Telephone Encounter (Signed)
Called patient for COVID-19 pre-screening for in office visit.  Have you recently traveled any where out of the local area in the last 2 weeks? NO Have you been in close contact with a person diagnosed with COVID-19 within the last 2 weeks? NO Do you currently have any of the following symptoms? If so, when did they start? Cough     Diarrhea  Joint Pain Fever      Muscle Pain  Red eyes Shortness of breath   Abdominal pain Vomiting Loss of smell    Rash   Sore Throat Headache    Weakness Bruising or bleeding   Okay to proceed with visit 11/05/2018

## 2018-11-05 ENCOUNTER — Other Ambulatory Visit: Payer: Self-pay

## 2018-11-05 ENCOUNTER — Ambulatory Visit: Payer: Medicare Other | Admitting: Internal Medicine

## 2018-11-05 ENCOUNTER — Encounter: Payer: Self-pay | Admitting: Internal Medicine

## 2018-11-05 VITALS — BP 154/86 | HR 60 | Temp 97.1°F | Ht 72.0 in | Wt 226.0 lb

## 2018-11-05 DIAGNOSIS — G4733 Obstructive sleep apnea (adult) (pediatric): Secondary | ICD-10-CM

## 2018-11-05 DIAGNOSIS — J9611 Chronic respiratory failure with hypoxia: Secondary | ICD-10-CM | POA: Diagnosis not present

## 2018-11-05 DIAGNOSIS — J449 Chronic obstructive pulmonary disease, unspecified: Secondary | ICD-10-CM

## 2018-11-05 NOTE — Patient Instructions (Signed)
Continue inhalers as prescribed Continue oxygen as prescribed Continue CPAP as prescribed

## 2018-11-05 NOTE — Progress Notes (Signed)
[facility] Consultation      MRN# [id number] Ernest Mclean 09-05-1940  No next brief History: 78 yo male with COPD, PVD with peripheral claudication, OSA  worst after PNA in 07/2014, now following for COPD and Chronic Hypoxic respiratory failure on 2L o2 (maily for PVD and at night for OSA). Pulmonary nodules on CT, sub cm, mainly due to inflammatory/infectious process.     CC  Follow-up COPD Follow-up sleep apnea   HPI Patient seen today for follow-up visit for COPD Patient currently on inhaler therapy and using daily No signs of exacerbation at this time No signs of infection at this time Uses Advair 500 and Spiriva Albuterol as needed  Patient has history of chronic respiratory failure on oxygen therapy He benefits and uses oxygen therapy at this time needs this for survival  No signs of acute heart failure at this time   Continue to use CPAP daily therapy for sleep apnea He is doing well with this regimen 100% compliance   He feels well and good Doing a great job with compliance  Patient has completed pulm rehab and doing INCENTIVE SPIROMETRY 25 times per day  His exercise capacity has significantly improved     BP (!) 154/86 (BP Location: Left Arm, Cuff Size: Normal)   Pulse 60   Temp (!) 97.1 F (36.2 C) (Temporal)   Ht 6' (1.829 m)   Wt 226 lb (102.5 kg)   SpO2 97%   BMI 30.65 kg/m     Medication:   Current Outpatient Medications:  .  albuterol (PROVENTIL HFA;VENTOLIN HFA) 108 (90 Base) MCG/ACT inhaler, Inhale 2 puffs into the lungs every 4 (four) hours as needed for wheezing or shortness of breath., Disp: 25.5 Inhaler, Rfl: 1 .  amLODipine (NORVASC) 5 MG tablet, Take 5 mg by mouth daily. , Disp: , Rfl:  .  ammonium lactate (AMLACTIN) 12 % cream, Apply 1 g topically as needed for dry skin., Disp: , Rfl:  .  apixaban (ELIQUIS) 5 MG TABS tablet, Take 5 mg by mouth 2 (two) times daily., Disp: , Rfl:  .  aspirin EC 81 MG tablet, Take  81 mg by mouth daily., Disp: , Rfl:  .  citalopram (CELEXA) 20 MG tablet, Take 20 mg by mouth at bedtime. , Disp: , Rfl: 5 .  CRANBERRY PO, Take 820 mg by mouth daily., Disp: , Rfl:  .  cyanocobalamin 500 MCG tablet, Take 500 mcg by mouth daily., Disp: , Rfl:  .  desonide (DESOWEN) 0.05 % cream, Apply topically., Disp: , Rfl:  .  diazepam (VALIUM) 5 MG tablet, Take 5 mg by mouth every 12 (twelve) hours as needed for anxiety. , Disp: , Rfl:  .  ergocalciferol (VITAMIN D2) 50000 units capsule, Take 50,000 Units by mouth every 30 (thirty) days., Disp: , Rfl:  .  Fluticasone-Salmeterol (WIXELA INHUB) 500-50 MCG/DOSE AEPB, TAKE 1 PUFF BY MOUTH TWICE A DAY, Disp: 180 each, Rfl: 1 .  furosemide (LASIX) 20 MG tablet, Take 20 mg by mouth daily. Take one tablet daily.  Can take extra tablet in afternoon if increase weight gain of 3 pounds or increased swelling., Disp: , Rfl:  .  insulin glargine (LANTUS) 100 UNIT/ML injection, Inject 70 Units into the skin at bedtime., Disp: , Rfl:  .  insulin lispro (HUMALOG) 100 UNIT/ML injection, Inject 15-25 Units into the skin 3 (three) times daily before meals. 15 units before breakfast, 25 units before lunch, and 25 units before supper  plus additional units for sliding scale, Disp: , Rfl:  .  ketoconazole (NIZORAL) 2 % shampoo, Apply 1 application topically 2 (two) times a week., Disp: , Rfl:  .  lisinopril (PRINIVIL,ZESTRIL) 5 MG tablet, Take 5 mg by mouth daily., Disp: , Rfl:  .  metFORMIN (GLUCOPHAGE) 500 MG tablet, Take 1,000 mg by mouth every evening. , Disp: , Rfl:  .  metoprolol (LOPRESSOR) 100 MG tablet, Take 100 mg by mouth 2 (two) times daily., Disp: , Rfl: 0 .  OMEGA-3 FATTY ACIDS-VITAMIN E PO, Take 1 capsule by mouth daily., Disp: , Rfl:  .  pravastatin (PRAVACHOL) 20 MG tablet, Take 20 mg by mouth at bedtime., Disp: , Rfl: 3 .  tiotropium (SPIRIVA) 18 MCG inhalation capsule, Place 18 mcg into inhaler and inhale daily., Disp: , Rfl:  .  vitamin C (ASCORBIC  ACID) 500 MG tablet, Take 500 mg by mouth daily., Disp: , Rfl:     Review of Systems  Constitutional: Negative for chills, fever and weight loss.  HENT: Negative for hearing loss.   Eyes: Negative for blurred vision.  Respiratory: Positive for shortness of breath. Negative for cough, sputum production and wheezing.        Improving SOB, now mostly with exertion  Cardiovascular: Positive for claudication. Negative for chest pain, palpitations and orthopnea.  Gastrointestinal: Negative for heartburn and nausea.  Genitourinary: Negative for dysuria.  Musculoskeletal: Positive for myalgias.       Chronic leg pain with exercise or exertion, has PVD/PAD  Skin: Negative for rash.  Neurological: Negative for headaches.    Allergies:  Codeine, Hydralazine, and Penicillins  Physical Examination:   GENERAL:NAD, no fevers, chills, no weakness no fatigue HEAD: Normocephalic, atraumatic.  EYES: PERLA, EOMI No scleral icterus.  MOUTH: Moist mucosal membrane.  EAR, NOSE, THROAT: Clear without exudates. No external lesions.  NECK: Supple. No thyromegaly.  No JVD.  PULMONARY: CTA B/L no wheezing, rhonchi, crackles CARDIOVASCULAR: S1 and S2. Regular rate and rhythm. No murmurs GASTROINTESTINAL: Soft, nontender, nondistended. Positive bowel sounds.  MUSCULOSKELETAL: No swelling, clubbing, or edema.  NEUROLOGIC: No gross focal neurological deficits. 5/5 strength all extremities SKIN: No ulceration, lesions, rashes, or cyanosis.  PSYCHIATRIC: Insight, judgment intact. -depression -anxiety ALL OTHER ROS ARE NEGATIVE           6MWT - 02/10/15 - distance: 218m - lowest saturation: 97% - no complaints during walk, no need for supplemental O2 with exertion  6MWT 09/20/2015 - distance 1177ft - lowest sat  95% - History of peripheral vascular disease and claudication, completed 8.5 minutes of 10 minutes of testing, stopped early due to leg pain  PFTs 02/10/15 postBD FEV1 75% postBD FVC 70%  FEV1/FVC 77% RV 147 TLC 126 RV/TLC 110 DLCO - unable toperform Impression - decrease FEV1, preserved FEV1/FVC ratio. Mild/Mod obstruction on inhalers, air trapping/hyperinflation noted.  Significant response to BD, >266ml on FVC and FEV1 post BD   EADIOLOGY: (The following images and results were reviewed by Dr. Stevenson Clinch on 11/05/2018). CT Chest 02/15/16 IMPRESSION:CT chest 01/2016 1. Several new pulmonary nodules since the prior chest CT in June 2016. 2. Patchy area of tree-in-bud appearance in the right upper lobe is likely inflammation or atypical infection. 3. Probable area of rounded atelectasis in the left lower lobe. 4. Stable emphysematous changes. 5. Advanced atherosclerotic calcifications involving the thoracic and abdominal aorta and branch vessels. 6. Scattered mediastinal and hilar lymph nodes. 7. I do not think PET-CT would be helpful as these nodules are too  small. Abdominal/pelvic CT may be helpful to exclude malignancy. If this is negative a three-month follow-up chest CT is suggested.   Repeat CT chest 05/2016 IMPRESSION: 1. Scattered small pulmonary nodules, regarded as new on the prior exam, are smaller or resolved on the current exam. 2. Resolved subpleural consolidation/ground-glass in the left lower lobe with residual scarring. 3. Additional pulmonary nodules are unchanged from 07/15/2014 and considered benign. 4. Aortic atherosclerosis (ICD10-170.0). Coronary artery calcification. 5. Cholelithiasis.  01/2015 PFTs  - preserved FEV1/FVC ratio, airtrapping and hyperinflation, significant response to BD.  6MWt  - 269m, no desats <88%, no need for supplemental O2.   09/2015 60mwt - 333m, lowest sat 95%, completed 8.5 of 19min, stopped early due to leg pain  02/02/2016: 74mwt - 41m/1378ft, lowest sat 97%, highest HR 80. Again on supplemental O2 for PVD with Claudication (given by Cards and Vasc Surg) and OSA with hypoxemia at night.   Assessment and Plan:    78 year old pleasant white male seen today for follow-up mild to moderate COPD Gold stage B in the setting of CKD CHF and obstructive sleep apnea with chronic hypoxic respiratory failure in the setting of deconditioned state with a history of pulmonary nodules   Chronic shortness of breath and dyspnea exertion related to multifactorial etiologies including COPD CHF and deconditioned state  COPD No acute exacerbation at this time No signs of infection at this time Continue Advair as prescribed Continue Spiriva as prescribed Albuterol as needed Patient advised to use albuterol 2 minutes prior to exertional activities   Chronic hypoxic respiratory failure from COPD Patient uses and benefits from oxygen therapy Needs this to survive   History of pulmonary nodules Previous CT scan shows resolution of nodules No further CT scans needed at this time   OSA on CPAP Currently on CPAP 10 cm of water pressure Continue oxygen as prescribed I will need compliance report Patient using and benefiting from CPAP therapy  Deconditioned state with chronic shortness of breath with exertion with COPD Continue incentive spirometry as prescribed 25  times per day Recommend pulmonary rehab referral when open Increased daily activity and exercise as tolerated  COVID-19 EDUCATION: The signs and symptoms of COVID-19 were discussed with the patient and how to seek care for testing.  The importance of social distancing was discussed today. Hand Washing Techniques and avoid touching face was advised.  MEDICATION ADJUSTMENTS/LABS AND TESTS ORDERED: Continue inhalers as prescribed Continue CPAP as prescribed Follow up with cardiology  CURRENT MEDICATIONS REVIEWED AT [facility]   Patient  satisfied with Plan of action and management. All questions answered Follow up in 6 months   Dany Harten Patricia Pesa, M.D.  [facility]  Medical Director [place] Director [facility] Cardio-Pulmonary Department         Patient/Family are satisfied with Plan of action and management. All questions answered Follow up in 6 months  Elvin Banker Patricia Pesa, M.D.  [facility]  Medical Director [place] Director [facility] Cardio-Pulmonary Department

## 2019-01-03 ENCOUNTER — Inpatient Hospital Stay
Admission: EM | Admit: 2019-01-03 | Discharge: 2019-01-06 | DRG: 871 | Disposition: A | Discharge status: Home Health | Payer: Medicare Other | Attending: Internal Medicine | Admitting: Internal Medicine

## 2019-01-03 ENCOUNTER — Emergency Department: Payer: Medicare Other

## 2019-01-03 ENCOUNTER — Other Ambulatory Visit: Payer: Self-pay

## 2019-01-03 DIAGNOSIS — Z20828 Contact with and (suspected) exposure to other viral communicable diseases: Secondary | ICD-10-CM | POA: Diagnosis present

## 2019-01-03 DIAGNOSIS — N183 Chronic kidney disease, stage 3 (moderate): Secondary | ICD-10-CM | POA: Diagnosis present

## 2019-01-03 DIAGNOSIS — Z885 Allergy status to narcotic agent status: Secondary | ICD-10-CM

## 2019-01-03 DIAGNOSIS — Z880 Allergy status to penicillin: Secondary | ICD-10-CM

## 2019-01-03 DIAGNOSIS — J441 Chronic obstructive pulmonary disease with (acute) exacerbation: Secondary | ICD-10-CM | POA: Diagnosis present

## 2019-01-03 DIAGNOSIS — Z888 Allergy status to other drugs, medicaments and biological substances status: Secondary | ICD-10-CM | POA: Diagnosis not present

## 2019-01-03 DIAGNOSIS — E1122 Type 2 diabetes mellitus with diabetic chronic kidney disease: Secondary | ICD-10-CM | POA: Diagnosis present

## 2019-01-03 DIAGNOSIS — Z9981 Dependence on supplemental oxygen: Secondary | ICD-10-CM | POA: Diagnosis not present

## 2019-01-03 DIAGNOSIS — R042 Hemoptysis: Secondary | ICD-10-CM | POA: Diagnosis present

## 2019-01-03 DIAGNOSIS — E669 Obesity, unspecified: Secondary | ICD-10-CM | POA: Diagnosis present

## 2019-01-03 DIAGNOSIS — I5022 Chronic systolic (congestive) heart failure: Secondary | ICD-10-CM | POA: Diagnosis present

## 2019-01-03 DIAGNOSIS — Z85828 Personal history of other malignant neoplasm of skin: Secondary | ICD-10-CM

## 2019-01-03 DIAGNOSIS — J9621 Acute and chronic respiratory failure with hypoxia: Secondary | ICD-10-CM | POA: Diagnosis present

## 2019-01-03 DIAGNOSIS — I482 Chronic atrial fibrillation, unspecified: Secondary | ICD-10-CM | POA: Diagnosis present

## 2019-01-03 DIAGNOSIS — I2510 Atherosclerotic heart disease of native coronary artery without angina pectoris: Secondary | ICD-10-CM | POA: Diagnosis present

## 2019-01-03 DIAGNOSIS — Z66 Do not resuscitate: Secondary | ICD-10-CM | POA: Diagnosis present

## 2019-01-03 DIAGNOSIS — Z6829 Body mass index (BMI) 29.0-29.9, adult: Secondary | ICD-10-CM

## 2019-01-03 DIAGNOSIS — J189 Pneumonia, unspecified organism: Secondary | ICD-10-CM | POA: Diagnosis present

## 2019-01-03 DIAGNOSIS — Z79899 Other long term (current) drug therapy: Secondary | ICD-10-CM

## 2019-01-03 DIAGNOSIS — J690 Pneumonitis due to inhalation of food and vomit: Secondary | ICD-10-CM | POA: Diagnosis present

## 2019-01-03 DIAGNOSIS — G4733 Obstructive sleep apnea (adult) (pediatric): Secondary | ICD-10-CM | POA: Diagnosis present

## 2019-01-03 DIAGNOSIS — R911 Solitary pulmonary nodule: Secondary | ICD-10-CM | POA: Diagnosis present

## 2019-01-03 DIAGNOSIS — Z7901 Long term (current) use of anticoagulants: Secondary | ICD-10-CM | POA: Diagnosis not present

## 2019-01-03 DIAGNOSIS — Z7982 Long term (current) use of aspirin: Secondary | ICD-10-CM

## 2019-01-03 DIAGNOSIS — A419 Sepsis, unspecified organism: Principal | ICD-10-CM | POA: Diagnosis present

## 2019-01-03 DIAGNOSIS — Z87891 Personal history of nicotine dependence: Secondary | ICD-10-CM

## 2019-01-03 DIAGNOSIS — Z794 Long term (current) use of insulin: Secondary | ICD-10-CM

## 2019-01-03 DIAGNOSIS — R0902 Hypoxemia: Secondary | ICD-10-CM

## 2019-01-03 DIAGNOSIS — I130 Hypertensive heart and chronic kidney disease with heart failure and stage 1 through stage 4 chronic kidney disease, or unspecified chronic kidney disease: Secondary | ICD-10-CM | POA: Diagnosis present

## 2019-01-03 DIAGNOSIS — K219 Gastro-esophageal reflux disease without esophagitis: Secondary | ICD-10-CM | POA: Diagnosis present

## 2019-01-03 DIAGNOSIS — Z7951 Long term (current) use of inhaled steroids: Secondary | ICD-10-CM

## 2019-01-03 LAB — CBC WITH DIFFERENTIAL/PLATELET
Abs Immature Granulocytes: 0.05 10*3/uL (ref 0.00–0.07)
Basophils Absolute: 0 10*3/uL (ref 0.0–0.1)
Basophils Relative: 0 %
Eosinophils Absolute: 0.1 10*3/uL (ref 0.0–0.5)
Eosinophils Relative: 1 %
HCT: 42.9 % (ref 39.0–52.0)
Hemoglobin: 14 g/dL (ref 13.0–17.0)
Immature Granulocytes: 0 %
Lymphocytes Relative: 5 %
Lymphs Abs: 0.6 10*3/uL — ABNORMAL LOW (ref 0.7–4.0)
MCH: 28.1 pg (ref 26.0–34.0)
MCHC: 32.6 g/dL (ref 30.0–36.0)
MCV: 86.1 fL (ref 80.0–100.0)
Monocytes Absolute: 0.8 10*3/uL (ref 0.1–1.0)
Monocytes Relative: 6 %
Neutro Abs: 10.7 10*3/uL — ABNORMAL HIGH (ref 1.7–7.7)
Neutrophils Relative %: 88 %
Platelets: 125 10*3/uL — ABNORMAL LOW (ref 150–400)
RBC: 4.98 MIL/uL (ref 4.22–5.81)
RDW: 15 % (ref 11.5–15.5)
WBC: 12.3 10*3/uL — ABNORMAL HIGH (ref 4.0–10.5)
nRBC: 0 % (ref 0.0–0.2)

## 2019-01-03 LAB — BLOOD GAS, VENOUS
Acid-Base Excess: 1.5 mmol/L (ref 0.0–2.0)
Bicarbonate: 27.2 mmol/L (ref 20.0–28.0)
O2 Saturation: 79.7 %
Patient temperature: 37
pCO2, Ven: 46 mmHg (ref 44.0–60.0)
pH, Ven: 7.38 (ref 7.250–7.430)
pO2, Ven: 45 mmHg (ref 32.0–45.0)

## 2019-01-03 LAB — BASIC METABOLIC PANEL
Anion gap: 10 (ref 5–15)
BUN: 44 mg/dL — ABNORMAL HIGH (ref 8–23)
CO2: 25 mmol/L (ref 22–32)
Calcium: 9.1 mg/dL (ref 8.9–10.3)
Chloride: 104 mmol/L (ref 98–111)
Creatinine, Ser: 1.82 mg/dL — ABNORMAL HIGH (ref 0.61–1.24)
GFR calc Af Amer: 41 mL/min — ABNORMAL LOW (ref >60)
GFR calc non Af Amer: 35 mL/min — ABNORMAL LOW (ref >60)
Glucose, Bld: 101 mg/dL — ABNORMAL HIGH (ref 70–99)
Potassium: 4.3 mmol/L (ref 3.5–5.1)
Sodium: 139 mmol/L (ref 135–145)

## 2019-01-03 LAB — GLUCOSE, CAPILLARY
Glucose-Capillary: 196 mg/dL — ABNORMAL HIGH (ref 70–99)
Glucose-Capillary: 341 mg/dL — ABNORMAL HIGH (ref 70–99)

## 2019-01-03 LAB — HEMOGLOBIN A1C
Hgb A1c MFr Bld: 7.9 % — ABNORMAL HIGH (ref 4.8–5.6)
Mean Plasma Glucose: 180.03 mg/dL

## 2019-01-03 LAB — LACTIC ACID, PLASMA: Lactic Acid, Venous: 1.6 mmol/L (ref 0.5–1.9)

## 2019-01-03 LAB — SARS CORONAVIRUS 2 BY RT PCR (HOSPITAL ORDER, PERFORMED IN ~~LOC~~ HOSPITAL LAB): SARS Coronavirus 2: NEGATIVE

## 2019-01-03 LAB — TROPONIN I (HIGH SENSITIVITY): Troponin I (High Sensitivity): 17 ng/L (ref <18)

## 2019-01-03 LAB — BRAIN NATRIURETIC PEPTIDE: B Natriuretic Peptide: 217 pg/mL — ABNORMAL HIGH (ref 0.0–100.0)

## 2019-01-03 MED ORDER — ONDANSETRON HCL 4 MG PO TABS: 4.0000 mg | ORAL_TABLET | Freq: Four times a day (QID) | ORAL | Status: DC | PRN

## 2019-01-03 MED ORDER — MOMETASONE FURO-FORMOTEROL FUM 200-5 MCG/ACT IN AERO
2.0000 | INHALATION_SPRAY | Freq: Two times a day (BID) | RESPIRATORY_TRACT | Status: DC
  Administered 2019-01-03 – 2019-01-06 (×6): 2 via RESPIRATORY_TRACT
  Filled 2019-01-03: qty 8.8

## 2019-01-03 MED ORDER — POLYETHYLENE GLYCOL 3350 17 G PO PACK
17.0000 g | PACK | Freq: Every day | ORAL | Status: DC | PRN
  Administered 2019-01-03: 17:00:00 17 g via ORAL
  Filled 2019-01-03: qty 1

## 2019-01-03 MED ORDER — FUROSEMIDE 20 MG PO TABS
20.0000 mg | ORAL_TABLET | Freq: Every day | ORAL | Status: DC
  Administered 2019-01-04 – 2019-01-06 (×3): 20 mg via ORAL
  Filled 2019-01-03 (×3): qty 1

## 2019-01-03 MED ORDER — PANTOPRAZOLE SODIUM 40 MG IV SOLR
40.0000 mg | Freq: Two times a day (BID) | INTRAVENOUS | Status: DC
  Administered 2019-01-03 – 2019-01-06 (×7): 40 mg via INTRAVENOUS
  Filled 2019-01-03 (×7): qty 40

## 2019-01-03 MED ORDER — METOPROLOL TARTRATE 50 MG PO TABS
100.0000 mg | ORAL_TABLET | Freq: Two times a day (BID) | ORAL | Status: DC
  Administered 2019-01-03 – 2019-01-06 (×6): 100 mg via ORAL
  Filled 2019-01-03 (×6): qty 2

## 2019-01-03 MED ORDER — CITALOPRAM HYDROBROMIDE 20 MG PO TABS
20.0000 mg | ORAL_TABLET | Freq: Every day | ORAL | Status: DC
  Administered 2019-01-04 – 2019-01-05 (×2): 20 mg via ORAL
  Filled 2019-01-03 (×2): qty 1

## 2019-01-03 MED ORDER — AMMONIUM LACTATE 12 % EX LOTN
1.0000 "application " | TOPICAL_LOTION | CUTANEOUS | Status: DC | PRN
  Filled 2019-01-03: qty 400

## 2019-01-03 MED ORDER — VANCOMYCIN HCL IN DEXTROSE 1-5 GM/200ML-% IV SOLN
1000.0000 mg | Freq: Once | INTRAVENOUS | Status: AC
  Administered 2019-01-03: 14:00:00 1000 mg via INTRAVENOUS
  Filled 2019-01-03: qty 200

## 2019-01-03 MED ORDER — CYANOCOBALAMIN 500 MCG PO TABS
500.0000 ug | ORAL_TABLET | Freq: Every day | ORAL | Status: DC
  Administered 2019-01-04 – 2019-01-06 (×3): 500 ug via ORAL
  Filled 2019-01-03 (×3): qty 1

## 2019-01-03 MED ORDER — PIPERACILLIN-TAZOBACTAM 3.375 G IVPB
3.3750 g | Freq: Three times a day (TID) | INTRAVENOUS | Status: DC
  Administered 2019-01-03 – 2019-01-05 (×5): 3.375 g via INTRAVENOUS
  Filled 2019-01-03 (×5): qty 50

## 2019-01-03 MED ORDER — TIOTROPIUM BROMIDE MONOHYDRATE 18 MCG IN CAPS
18.0000 ug | ORAL_CAPSULE | Freq: Every day | RESPIRATORY_TRACT | Status: DC
  Administered 2019-01-04 – 2019-01-06 (×3): 18 ug via RESPIRATORY_TRACT
  Filled 2019-01-03: qty 5

## 2019-01-03 MED ORDER — SODIUM CHLORIDE 0.9% FLUSH
3.0000 mL | Freq: Two times a day (BID) | INTRAVENOUS | Status: DC
  Administered 2019-01-03 – 2019-01-06 (×8): 3 mL via INTRAVENOUS

## 2019-01-03 MED ORDER — INSULIN ASPART 100 UNIT/ML ~~LOC~~ SOLN
0.0000 [IU] | Freq: Three times a day (TID) | SUBCUTANEOUS | Status: DC
  Administered 2019-01-03: 2 [IU] via SUBCUTANEOUS
  Administered 2019-01-04: 9 [IU] via SUBCUTANEOUS
  Administered 2019-01-05: 08:00:00 7 [IU] via SUBCUTANEOUS
  Filled 2019-01-03 (×4): qty 1

## 2019-01-03 MED ORDER — INSULIN GLARGINE 100 UNIT/ML ~~LOC~~ SOLN
30.0000 [IU] | Freq: Every day | SUBCUTANEOUS | Status: DC
  Administered 2019-01-03: 23:00:00 30 [IU] via SUBCUTANEOUS
  Filled 2019-01-03 (×2): qty 0.3

## 2019-01-03 MED ORDER — PIPERACILLIN-TAZOBACTAM 3.375 G IVPB: 3.3750 g | Freq: Three times a day (TID) | INTRAVENOUS | Status: DC

## 2019-01-03 MED ORDER — ACETAMINOPHEN 325 MG PO TABS: 650.0000 mg | ORAL_TABLET | Freq: Four times a day (QID) | ORAL | Status: DC | PRN

## 2019-01-03 MED ORDER — ONDANSETRON HCL 4 MG/2ML IJ SOLN: 4.0000 mg | Freq: Four times a day (QID) | INTRAMUSCULAR | Status: DC | PRN

## 2019-01-03 MED ORDER — PREGABALIN 25 MG PO CAPS
25.0000 mg | ORAL_CAPSULE | Freq: Two times a day (BID) | ORAL | Status: DC
  Administered 2019-01-03 – 2019-01-06 (×6): 25 mg via ORAL
  Filled 2019-01-03 (×6): qty 1

## 2019-01-03 MED ORDER — PRAVASTATIN SODIUM 20 MG PO TABS
20.0000 mg | ORAL_TABLET | Freq: Every day | ORAL | Status: DC
  Administered 2019-01-03 – 2019-01-05 (×3): 20 mg via ORAL
  Filled 2019-01-03 (×3): qty 1

## 2019-01-03 MED ORDER — PIPERACILLIN-TAZOBACTAM 3.375 G IVPB 30 MIN
3.3750 g | Freq: Once | INTRAVENOUS | Status: AC
  Administered 2019-01-03: 14:00:00 3.375 g via INTRAVENOUS
  Filled 2019-01-03: qty 50

## 2019-01-03 MED ORDER — ALBUTEROL SULFATE (2.5 MG/3ML) 0.083% IN NEBU: 2.5000 mg | INHALATION_SOLUTION | RESPIRATORY_TRACT | Status: DC | PRN

## 2019-01-03 MED ORDER — ACETAMINOPHEN 650 MG RE SUPP: 650.0000 mg | Freq: Four times a day (QID) | RECTAL | Status: DC | PRN

## 2019-01-03 MED ORDER — METHYLPREDNISOLONE SODIUM SUCC 125 MG IJ SOLR
60.0000 mg | INTRAMUSCULAR | Status: DC
  Administered 2019-01-03 – 2019-01-05 (×3): 60 mg via INTRAVENOUS
  Filled 2019-01-03 (×3): qty 2

## 2019-01-03 MED ORDER — DIAZEPAM 5 MG PO TABS: 5.0000 mg | ORAL_TABLET | Freq: Two times a day (BID) | ORAL | Status: DC | PRN

## 2019-01-03 NOTE — ED Notes (Signed)
ED TO INPATIENT HANDOFF REPORT  ED Nurse Name and Phone #: [phone]  S Name/Age/Gender Ernest Mclean 78 y.o. male Room/Bed: ED11A/ED11A  Code Status   Code Status: DNR  Home/SNF/Other Home Patient oriented to: self, place, time and situation Is this baseline? Yes   Triage Complete: Triage complete  Chief Complaint Resp Distress   Triage Note Pt arrives via EMS from home with chief complaint of vomiting bright red blood. EMS reports O2 sat 67% on room air, arrived on CPAP with O2 sat of 89%. Pt on 4L [place] at this time 90%.  History of CHF, occlusion aorta inoperable, a flutter. EMS reports BP 165/94, temp 100.5 oral. Pt reports no pain at this time.   Allergies Allergies  Allergen Reactions  . Codeine Hives, Rash and Swelling  . Hydralazine Other (See Comments)    tongue swollen and couldn't wake up ---- not positive it was this or a mix of this with something else or high sugar  . Penicillins Other (See Comments)    Passed out (at 78 yrs old) Has patient had a PCN reaction causing immediate rash, facial/tongue/throat swelling, SOB or lightheadedness with hypotension: No Has patient had a PCN reaction causing severe rash involving mucus membranes or skin necrosis: No Has patient had a PCN reaction that required hospitalization No Has patient had a PCN reaction occurring within the last 10 years: No If all of the above answers are "NO", then may proceed with Cephalosporin use.     Level of Care/Admitting Diagnosis ED Disposition    ED Disposition Condition [facility] Area: [place] [[id number]]  Level of Care: Med-Surg [16]  Covid Evaluation: N/A  Diagnosis: Pneumonia [130865]  Admitting Physician: Hillary Bow [[id number]]  Attending Physician: Hillary Bow [[id number]]  Estimated length of stay: past midnight tomorrow  Certification:: I certify this patient will need inpatient services for at least 2 midnights  PT Class (Do Not Modify):  Inpatient [101]  PT Acc Code (Do Not Modify): Private [1]       B Medical/Surgery History Past Medical History:  Diagnosis Date  . Atrial fibrillation ([place])   . Basal cell carcinoma 06/2017   Left Ear  . CAD (coronary artery disease)   . CKD (chronic kidney disease)   . Congestive heart failure ([place])   . COPD (chronic obstructive pulmonary disease) ([place])   . Diabetes ([place])   . Hypertension   . OSA on CPAP   . 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 CLAVICLE SURGERY    . heart bypass    . HERNIA REPAIR    . MOHS SURGERY  07/10/2017   Head     A IV Location/Drains/Wounds Patient Lines/Drains/Airways Status   Active Line/Drains/Airways    Name:   Placement date:   Placement time:   Site:   Days:   Peripheral IV 01/03/19 Right Forearm   01/03/19    1257    Forearm   less than 1   Peripheral IV 01/03/19 Left Wrist   01/03/19    1324    Wrist   less than 1          Intake/Output Last 24 hours  Intake/Output Summary (Last 24 hours) at 01/03/2019 1607 Last data filed at 01/03/2019 1502 Gross per 24 hour  Intake 250 ml  Output -  Net 250 ml    Labs/Imaging Results for orders  placed or performed during the hospital encounter of 01/03/19 (from the past 48 hour(s))  Troponin I (High Sensitivity)     Status: None   Collection Time: 01/03/19 12:48 PM  Result Value Ref Range   Troponin I (High Sensitivity) 17 <18 ng/L    Comment: (NOTE) Elevated high sensitivity troponin I (hsTnI) values and significant  changes across serial measurements may suggest ACS but many other  chronic and acute conditions are known to elevate hsTnI results.  Refer to the "Links" section for chest pain algorithms and additional  guidance. Performed at [facility], [address] [place], [place] [address]   CBC with Differential     Status: Abnormal   Collection Time: 01/03/19 12:48 PM  Result Value Ref Range   WBC  12.3 (H) 4.0 - 10.5 K/uL   RBC 4.98 4.22 - 5.81 MIL/uL   Hemoglobin 14.0 13.0 - 17.0 g/dL   HCT 42.9 39.0 - 52.0 %   MCV 86.1 80.0 - 100.0 fL   MCH 28.1 26.0 - 34.0 pg   MCHC 32.6 30.0 - 36.0 g/dL   RDW 15.0 11.5 - 15.5 %   Platelets 125 (L) 150 - 400 K/uL   nRBC 0.0 0.0 - 0.2 %   Neutrophils Relative % 88 %   Neutro Abs 10.7 (H) 1.7 - 7.7 K/uL   Lymphocytes Relative 5 %   Lymphs Abs 0.6 (L) 0.7 - 4.0 K/uL   Monocytes Relative 6 %   Monocytes Absolute 0.8 0.1 - 1.0 K/uL   Eosinophils Relative 1 %   Eosinophils Absolute 0.1 0.0 - 0.5 K/uL   Basophils Relative 0 %   Basophils Absolute 0.0 0.0 - 0.1 K/uL   Immature Granulocytes 0 %   Abs Immature Granulocytes 0.05 0.00 - 0.07 K/uL    Comment: Performed at [facility], [address] [place], [place] [address]  Basic metabolic panel     Status: Abnormal   Collection Time: 01/03/19 12:48 PM  Result Value Ref Range   Sodium 139 135 - 145 mmol/L   Potassium 4.3 3.5 - 5.1 mmol/L   Chloride 104 98 - 111 mmol/L   CO2 25 22 - 32 mmol/L   Glucose, Bld 101 (H) 70 - 99 mg/dL   BUN 44 (H) 8 - 23 mg/dL   Creatinine, Ser 1.82 (H) 0.61 - 1.24 mg/dL   Calcium 9.1 8.9 - 10.3 mg/dL   GFR calc non Af Amer 35 (L) >60 mL/min   GFR calc Af Amer 41 (L) >60 mL/min   Anion gap 10 5 - 15    Comment: Performed at [facility], [address] [place], [place] [address]  Brain natriuretic peptide     Status: Abnormal   Collection Time: 01/03/19 12:48 PM  Result Value Ref Range   B Natriuretic Peptide 217.0 (H) 0.0 - 100.0 pg/mL    Comment: Performed at [facility], [address] [place], [place] [address]  Lactic acid, plasma     Status: None   Collection Time: 01/03/19 12:49 PM  Result Value Ref Range   Lactic Acid, Venous 1.6 0.5 - 1.9 mmol/L    Comment: Performed at [facility], [address] [place], [place] [address]  SARS Coronavirus 2 [facility] order, Performed in [facility] hospital lab)  Nasopharyngeal Nasopharyngeal Swab     Status: None   Collection Time: 01/03/19  1:05 PM   Specimen: Nasopharyngeal Swab  Result Value Ref Range   SARS  Coronavirus 2 NEGATIVE NEGATIVE    Comment: (NOTE) If result is NEGATIVE SARS-CoV-2 target nucleic acids are NOT DETECTED. The SARS-CoV-2 RNA is generally detectable in upper and lower  respiratory specimens during the acute phase of infection. The lowest  concentration of SARS-CoV-2 viral copies this assay can detect is 250  copies / mL. A negative result does not preclude SARS-CoV-2 infection  and should not be used as the sole basis for treatment or other  patient management decisions.  A negative result may occur with  improper specimen collection / handling, submission of specimen other  than nasopharyngeal swab, presence of viral mutation(s) within the  areas targeted by this assay, and inadequate number of viral copies  (<250 copies / mL). A negative result must be combined with clinical  observations, patient history, and epidemiological information. If result is POSITIVE SARS-CoV-2 target nucleic acids are DETECTED. The SARS-CoV-2 RNA is generally detectable in upper and lower  respiratory specimens dur ing the acute phase of infection.  Positive  results are indicative of active infection with SARS-CoV-2.  Clinical  correlation with patient history and other diagnostic information is  necessary to determine patient infection status.  Positive results do  not rule out bacterial infection or co-infection with other viruses. If result is PRESUMPTIVE POSTIVE SARS-CoV-2 nucleic acids MAY BE PRESENT.   A presumptive positive result was obtained on the submitted specimen  and confirmed on repeat testing.  While 2019 novel coronavirus  (SARS-CoV-2) nucleic acids may be present in the submitted sample  additional confirmatory testing may be necessary for epidemiological  and / or clinical management purposes  to differentiate  between  SARS-CoV-2 and other Sarbecovirus currently known to infect humans.  If clinically indicated additional testing with an alternate test  methodology [phone]) is advised. The SARS-CoV-2 RNA is generally  detectable in upper and lower respiratory sp ecimens during the acute  phase of infection. The expected result is Negative. Fact Sheet for Patients:  StrictlyIdeas.no Fact Sheet for Healthcare Providers: BankingDealers.co.za This test is not yet approved or cleared by the [place] FDA and has been authorized for detection and/or diagnosis of SARS-CoV-2 by FDA under an Emergency Use Authorization (EUA).  This EUA will remain in effect (meaning this test can be used) for the duration of the COVID-19 declaration under Section 564(b)(1) of the Act, 21 U.S.C. section 360bbb-3(b)(1), unless the authorization is terminated or revoked sooner. Performed at [facility], [address] [place], [place] [address]   Blood gas, venous     Status: None   Collection Time: 01/03/19  1:05 PM  Result Value Ref Range   pH, Ven 7.38 7.250 - 7.430   pCO2, Ven 46 44.0 - 60.0 mmHg   pO2, Ven 45.0 32.0 - 45.0 mmHg   Bicarbonate 27.2 20.0 - 28.0 mmol/L   Acid-Base Excess 1.5 0.0 - 2.0 mmol/L   O2 Saturation 79.7 %   Patient temperature 37.0    Collection site VEIN    Sample type VEIN     Comment: Performed at [facility], [address], [place], [place] [address]   Dg Chest Port 1 View  Result Date: 01/03/2019 CLINICAL DATA:  Shortness of breath and hematemesis. EXAM: PORTABLE CHEST 1 VIEW COMPARISON:  Chest x-ray dated August 01, 2016. FINDINGS: Stable cardiomediastinal silhouette status post CABG. Normal pulmonary vascularity. New consolidation in the right upper and lower lobes. The left lung is clear. No pleural effusion or pneumothorax. No acute osseous abnormality. IMPRESSION:  1. Multilobar pneumonia involving the  right upper and lower lobes. Followup PA and lateral chest X-ray is recommended in 3-4 weeks following trial of antibiotic therapy to ensure resolution and exclude underlying malignancy. Electronically Signed   By: Titus Dubin M.D.   On: 01/03/2019 13:15    Pending Labs Unresulted Labs (From admission, onward)    Start     Ordered   01/04/19 8828  Basic metabolic panel  Tomorrow morning,   STAT     01/03/19 1542   01/04/19 0500  CBC  Tomorrow morning,   STAT     01/03/19 1542   01/03/19 1549  Occult blood card to lab, stool RN will collect  Once,   STAT    Question:  Specimen to be collected by:  Answer:  RN will collect   01/03/19 1548   01/03/19 1542  Hemoglobin A1c  Add-on,   AD    Comments: To assess prior glycemic control    01/03/19 1542   01/03/19 1246  Blood culture (routine x 2)  BLOOD CULTURE X 2,   STAT     01/03/19 1245          Vitals/Pain Today's Vitals   01/03/19 1300 01/03/19 1354 01/03/19 1502 01/03/19 1530  BP: (!) 153/63   (!) 138/50  Pulse: 64   66  Resp: (!) 25     Temp:      TempSrc:      SpO2: 94%   97%  Weight:      Height:      PainSc:  0-No pain 0-No pain     Isolation Precautions No active isolations  Medications Medications  sodium chloride flush (NS) 0.9 % injection 3 mL (has no administration in time range)  acetaminophen (TYLENOL) tablet 650 mg (has no administration in time range)    Or  acetaminophen (TYLENOL) suppository 650 mg (has no administration in time range)  polyethylene glycol (MIRALAX / GLYCOLAX) packet 17 g (has no administration in time range)  ondansetron (ZOFRAN) tablet 4 mg (has no administration in time range)    Or  ondansetron (ZOFRAN) injection 4 mg (has no administration in time range)  albuterol (PROVENTIL) (2.5 MG/3ML) 0.083% nebulizer solution 2.5 mg (has no administration in time range)  insulin aspart (novoLOG) injection 0-9 Units (has no administration in time range)  pantoprazole (PROTONIX) injection  40 mg (has no administration in time range)  methylPREDNISolone sodium succinate (SOLU-MEDROL) 125 mg/2 mL injection 60 mg (has no administration in time range)  piperacillin-tazobactam (ZOSYN) IVPB 3.375 g (has no administration in time range)  vancomycin (VANCOCIN) IVPB 1000 mg/200 mL premix (0 mg Intravenous Stopped 01/03/19 1502)  piperacillin-tazobactam (ZOSYN) IVPB 3.375 g (0 g Intravenous Stopped 01/03/19 1429)    Mobility walks Low fall risk   Focused Assessments GI bleed   R Recommendations: See Admitting Provider Note  Report given to:   Additional Notes:  Arrived for vomiting blood.

## 2019-01-03 NOTE — ED Notes (Signed)
AAOx3.  Skin warm and dry.  nad 

## 2019-01-03 NOTE — Progress Notes (Signed)
Advance care planning  Purpose of Encounter Pneumonia, upper GI bleed  Parties in Attendance Patient, wife at bedside  Patients Decisional capacity Patient is alert and oriented.  Able to make medical decisions  Document healthcare power of attorney is his wife No ACP documents in place  Discussed in detail regarding Pneumonia, upper GI bleed.  Treatment plan , prognosis discussed.  All questions answered.  CODE STATUS discussed and patient wishes to be DNR/DNI.  Orders entered and CODE STATUS changed  Time spent - 17 minutes

## 2019-01-03 NOTE — Consult Note (Addendum)
Pharmacy Antibiotic Note  Ernest Mclean is a 78 y.o. male admitted on 01/03/2019 with aspiration pneumonia.  Pharmacy has been consulted for Zosyn dosing.  Patient received 1 dose in ED 8/14 @ 1349 w/ no s/sx of allergic reaction per Opal Sidles RN - will continue therapy as confirmed with Dr Darvin Neighbours  Plan: Zosyn 3.375g IV q8h (4 hour infusion).  Height: 6' (182.9 cm) Weight: 215 lb (97.5 kg) IBW/kg (Calculated) : 77.6  Temp (24hrs), Avg:99.7 F (37.6 C), Min:99.7 F (37.6 C), Max:99.7 F (37.6 C)  Recent Labs  Lab 01/03/19 1248 01/03/19 1249  WBC 12.3*  --   CREATININE 1.82*  --   LATICACIDVEN  --  1.6    Estimated Creatinine Clearance: 41.8 mL/min (A) (by C-G formula based on SCr of 1.82 mg/dL (H)).    Allergies  Allergen Reactions  . Codeine Hives, Rash and Swelling  . Hydralazine Other (See Comments)    tongue swollen and couldn't wake up ---- not positive it was this or a mix of this with something else or high sugar  . Penicillins Other (See Comments)    Passed out (at 78 yrs old) Has patient had a PCN reaction causing immediate rash, facial/tongue/throat swelling, SOB or lightheadedness with hypotension: No Has patient had a PCN reaction causing severe rash involving mucus membranes or skin necrosis: No Has patient had a PCN reaction that required hospitalization No Has patient had a PCN reaction occurring within the last 10 years: No If all of the above answers are "NO", then may proceed with Cephalosporin use.     Antimicrobials this admission: Vancomycin x 1g 8/14 Zosyn 8/14 >>   Dose adjustments this admission: None  Microbiology results: 8/14 BCx: Pending  8/14 COVID NEG  Thank you for allowing pharmacy to be a part of this patient's care.  Lu Duffel, PharmD, BCPS Clinical Pharmacist 01/03/2019 3:43 PM

## 2019-01-03 NOTE — H&P (Signed)
[facility] NAME: Ernest Mclean    MR#:  [id number]  DATE OF BIRTH:  Jun 27, 1940  DATE OF ADMISSION:  01/03/2019  PRIMARY CARE PHYSICIAN: Juluis Pitch, MD   REQUESTING/REFERRING PHYSICIAN: Dr. Jimmye Norman  CHIEF COMPLAINT:   Chief Complaint  Patient presents with  . Shortness of Breath    HISTORY OF PRESENT ILLNESS:  Ernest Mclean  is a 78 y.o. male with a known history of hypertension, diabetes mellitus, CKD stage III, chronic systolic CHF, on aspirin and Eliquis presents to the emergency room after he had nausea and vomiting with no blood in it.  Patient also had shortness of breath with cough which is not productive.  Patient uses oxygen as needed and wife noticed his oxygen saturations going down into the 60s.  He was placed on oxygen and EMS called.  Patient was brought to the emergency room.  Here his chest x-ray shows a right middle and lower lobe infiltrates.  Hemoglobin stable at 14.  Patient feels extremely weak and short of breath.  On 4 L oxygen. No prior history of GI bleed.  PAST MEDICAL HISTORY:   Past Medical History:  Diagnosis Date  . Atrial fibrillation ([place])   . Basal cell carcinoma 06/2017   Left Ear  . CAD (coronary artery disease)   . CKD (chronic kidney disease)   . Congestive heart failure ([place])   . COPD (chronic obstructive pulmonary disease) ([place])   . Diabetes ([place])   . Hypertension   . OSA on CPAP   . 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 CLAVICLE SURGERY    . heart bypass    . HERNIA REPAIR    . MOHS SURGERY  07/10/2017   Head    SOCIAL HISTORY:   Social History   Tobacco Use  . Smoking status: Former Smoker    Packs/day: 1.00    Years: 30.00    Pack years: 30.00    Types: Cigarettes    Quit date: 12/14/2001    Years since quitting: 17.0  . Smokeless tobacco: Never Used  . Tobacco comment: quit  12/14/2001  Substance Use Topics  . Alcohol use: No    Alcohol/week: 0.0 standard drinks    FAMILY HISTORY:   Family History  Problem Relation Age of Onset  . Stroke Unknown   . Diabetes Unknown   . Breast cancer Unknown   . Colon cancer Unknown     DRUG ALLERGIES:   Allergies  Allergen Reactions  . Codeine Hives, Rash and Swelling  . Hydralazine Other (See Comments)    tongue swollen and couldn't wake up ---- not positive it was this or a mix of this with something else or high sugar  . Penicillins Other (See Comments)    Passed out (at 78 yrs old) Has patient had a PCN reaction causing immediate rash, facial/tongue/throat swelling, SOB or lightheadedness with hypotension: No Has patient had a PCN reaction causing severe rash involving mucus membranes or skin necrosis: No Has patient had a PCN reaction that required hospitalization No Has patient had a PCN reaction occurring within the last 10 years: No If all of the above answers are "NO", then may proceed with Cephalosporin use.     REVIEW OF SYSTEMS:   Review of Systems  Constitutional: Positive for malaise/fatigue. Negative for chills and fever.  HENT: Negative  for sore throat.   Eyes: Negative for blurred vision, double vision and pain.  Respiratory: Positive for cough and shortness of breath. Negative for hemoptysis and wheezing.   Cardiovascular: Negative for chest pain, palpitations, orthopnea and leg swelling.  Gastrointestinal: Positive for nausea and vomiting. Negative for abdominal pain, constipation, diarrhea and heartburn.  Genitourinary: Negative for dysuria and hematuria.  Musculoskeletal: Negative for back pain and joint pain.  Skin: Negative for rash.  Neurological: Negative for sensory change, speech change, focal weakness and headaches.  Endo/Heme/Allergies: Does not bruise/bleed easily.  Psychiatric/Behavioral: Negative for depression. The patient is not nervous/anxious.     MEDICATIONS AT HOME:    Prior to Admission medications   Medication Sig Start Date End Date Taking? Authorizing Provider  albuterol (PROVENTIL HFA;VENTOLIN HFA) 108 (90 Base) MCG/ACT inhaler Inhale 2 puffs into the lungs every 4 (four) hours as needed for wheezing or shortness of breath. 02/18/18  Yes Kasa, Maretta Bees, MD  amLODipine (NORVASC) 5 MG tablet Take 5 mg by mouth daily.  08/17/14  Yes [provider]  ammonium lactate (AMLACTIN) 12 % cream Apply 1 g topically as needed for dry skin.   Yes [provider]  apixaban (ELIQUIS) 5 MG TABS tablet Take 2.5 mg by mouth 2 (two) times daily. 0.5 tablets b 08/17/14  Yes [provider]  aspirin EC 81 MG tablet Take 81 mg by mouth daily. 08/17/14  Yes [provider]  citalopram (CELEXA) 20 MG tablet Take 20 mg by mouth at bedtime.  07/30/14  Yes [provider]  CRANBERRY PO Take 820 mg by mouth daily.   Yes [provider]  cyanocobalamin 500 MCG tablet Take 500 mcg by mouth daily.   Yes [provider]  desonide (DESOWEN) 0.05 % cream Apply 1 application topically daily as needed.  05/10/16  Yes [provider]  ergocalciferol (VITAMIN D2) 50000 units capsule Take 50,000 Units by mouth every 30 (thirty) days.   Yes [provider]  furosemide (LASIX) 20 MG tablet Take 20 mg by mouth daily.    Yes [provider]  insulin glargine (LANTUS) 100 UNIT/ML injection Inject 60 Units into the skin at bedtime.    Yes [provider]  insulin lispro (HUMALOG) 100 UNIT/ML injection Inject 0-150 Units into the skin 3 (three) times daily before meals.    Yes [provider]  lisinopril (PRINIVIL,ZESTRIL) 5 MG tablet Take 5 mg by mouth daily.   Yes [provider]  metFORMIN (GLUCOPHAGE) 500 MG tablet Take 1,000 mg by mouth every evening.    Yes [provider]  metoprolol (LOPRESSOR) 100 MG tablet Take 100 mg by mouth 2 (two) times daily. 06/17/14  Yes [provider]  OMEGA-3 FATTY ACIDS-VITAMIN E PO Take 1 capsule by mouth daily.   Yes [provider]  omeprazole (PRILOSEC) 20 MG capsule Take 20 mg by mouth daily.   Yes [provider]  pravastatin (PRAVACHOL) 20 MG tablet Take 20 mg by mouth at bedtime. 07/31/14  Yes [provider]  pregabalin (LYRICA) 25 MG capsule Take 25 mg by mouth 2 (two) times daily.   Yes [provider]  tiotropium (SPIRIVA) 18 MCG inhalation capsule Place 18 mcg into inhaler and inhale daily. 07/31/14  Yes [provider]  vitamin C (ASCORBIC ACID) 500 MG tablet Take 500 mg by mouth daily.   Yes [provider]  diazepam (VALIUM) 5 MG tablet Take 5 mg by mouth every 12 (twelve) hours  as needed for anxiety.  10/24/13   [provider]  Fluticasone-Salmeterol (WIXELA INHUB) 500-50 MCG/DOSE AEPB TAKE 1 PUFF BY MOUTH TWICE A DAY Patient not taking: Reported on 01/03/2019 10/22/18   Flora Lipps, MD  ketoconazole (NIZORAL) 2 % shampoo Apply 1 application topically 2 (two) times a week. 05/11/16   [provider]     VITAL SIGNS:  Blood pressure (!) 153/63, pulse 64, temperature 99.7 F (37.6 C), temperature source Oral, resp. rate (!) 25, height 6' (1.829 m), weight 97.5 kg, SpO2 94 %.  PHYSICAL EXAMINATION:  Physical Exam  GENERAL:  78 y.o.-year-old patient lying in the bed with no acute distress.  EYES: Pupils equal, round, reactive to light and accommodation. No scleral icterus. Extraocular muscles intact.  HEENT: Head atraumatic, normocephalic. Oropharynx and nasopharynx clear. No oropharyngeal erythema, moist oral mucosa  NECK:  Supple, no jugular venous distention. No thyroid enlargement, no tenderness.  LUNGS: Bilateral wheezing with more on the right side. CARDIOVASCULAR: S1, S2 normal. No murmurs, rubs, or gallops.  ABDOMEN: Soft, nontender, nondistended. Bowel sounds present. No organomegaly or mass.  EXTREMITIES: No pedal edema, cyanosis,  or clubbing. + 2 pedal & radial pulses b/l.   NEUROLOGIC: Cranial nerves II through XII are intact. No focal Motor or sensory deficits appreciated b/l PSYCHIATRIC: The patient is alert and oriented x 3. Good affect.  SKIN: No obvious rash, lesion, or ulcer.   LABORATORY PANEL:   CBC Recent Labs  Lab 01/03/19 1248  WBC 12.3*  HGB 14.0  HCT 42.9  PLT 125*   ------------------------------------------------------------------------------------------------------------------  Chemistries  Recent Labs  Lab 01/03/19 1248  NA 139  K 4.3  CL 104  CO2 25  GLUCOSE 101*  BUN 44*  CREATININE 1.82*  CALCIUM 9.1   ------------------------------------------------------------------------------------------------------------------  Cardiac Enzymes No results for input(s): TROPONINI in the last 168 hours. ------------------------------------------------------------------------------------------------------------------  RADIOLOGY:  Dg Chest Port 1 View  Result Date: 01/03/2019 CLINICAL DATA:  Shortness of breath and hematemesis. EXAM: PORTABLE CHEST 1 VIEW COMPARISON:  Chest x-ray dated August 01, 2016. FINDINGS: Stable cardiomediastinal silhouette status post CABG. Normal pulmonary vascularity. New consolidation in the right upper and lower lobes. The left lung is clear. No pleural effusion or pneumothorax. No acute osseous abnormality. IMPRESSION: 1. Multilobar pneumonia involving the right upper and lower lobes. Followup PA and lateral chest X-ray is recommended in 3-4 weeks following trial of antibiotic therapy to ensure resolution and exclude underlying malignancy. Electronically Signed   By: Titus Dubin M.D.   On: 01/03/2019 13:15   IMPRESSION AND PLAN:   *Right-sided pneumonia most likely aspiration pneumonia.  Associated with acute hypoxic respiratory failure and sepsis present on admission We will start patient on IV Zosyn.  Blood culture sent and pending Wean oxygen to keep  saturations over 92%.  Nebulizers as needed.  *COPD exacerbation.  Likely due to his aspiration event.  We will start him on Solu-Medrol.  Nebulizers.  Continue home inhalers.  *Upper GI bleed.  Patient mentions he threw up bright red blood with mucus.  It is unclear if he coughed up blood or threw up blood.   We will check a stool Hemoccult.  Hemoglobin is stable at 14 at this time. Start Protonix IV twice daily. If there is any drop will consult GI.  *CKD stage III is stable  *Paroxysmal atrial fibrillation.  Continue rate control medications.  Hold aspirin and Eliquis.  *Chronic systolic CHF.  No signs of fluid overload.  Continue oral Lasix.  *  Diabetes mellitus.  Reduced his Lantus to half.  Sliding scale insulin ordered.  Diabetic diet.  DVT prophylaxis with SCDs  All the records are reviewed and case discussed with ED provider. Management plans discussed with the patient, family and they are in agreement.  CODE STATUS: DNR/DNI  TOTAL TIME TAKING CARE OF THIS PATIENT: 40 minutes.   Neita Carp M.D on 01/03/2019 at 3:45 PM  Between 7am to 6pm - Pager - [phone]  After 6pm go to [email or web address] - password [email or web address] [facility]  Office  [phone]  CC: Primary care physician; Juluis Pitch, MD  Note: This dictation was prepared with Dragon dictation along with smaller phrase technology. Any transcriptional errors that result from this process are unintentional.

## 2019-01-03 NOTE — ED Triage Notes (Addendum)
Pt arrives via EMS from home with chief complaint of vomiting bright red blood. EMS reports O2 sat 67% on room air, arrived on CPAP with O2 sat of 89%. Pt on 4L [place] at this time 90%.  History of CHF, occlusion aorta inoperable, a flutter. EMS reports BP 165/94, temp 100.5 oral. Pt reports no pain at this time.

## 2019-01-03 NOTE — ED Provider Notes (Signed)
[facility] Emergency Department Provider Note       Time seen: ----------------------------------------- 12:55 PM on 01/03/2019 -----------------------------------------   I have reviewed the triage vital signs and the nursing notes.  HISTORY   Chief Complaint GI Bleeding   HPI Ernest Mclean is a 78 y.o. male with a history of atrial fibrillation, coronary artery disease, chronic kidney disease, congestive heart failure, COPD, diabetes, hypertension who presents to the ED initially for vomiting bright red blood.  Patient actually denies this complaints, wife was concerned about bright red blood.  EMS reports his oxygen saturation were very low in the 60s on room air.  He was placed on CPAP.  He denies having any fever, was 100.5 prehospital by EMS.  He denies any pain.  Past Medical History:  Diagnosis Date  . Atrial fibrillation ([place])   . Basal cell carcinoma 06/2017   Left Ear  . CAD (coronary artery disease)   . CKD (chronic kidney disease)   . Congestive heart failure ([place])   . COPD (chronic obstructive pulmonary disease) ([place])   . Diabetes ([place])   . Hypertension   . OSA on CPAP   . 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 Atherosclerosis of native arteries of extremity with intermittent claudication ([place]) 05/08/2017  . Atherosclerosis of native arteries of the extremities with ulceration ([place]) 03/20/2017  . CKD (chronic kidney disease) 03/20/2017  . CHF (congestive heart failure) ([place]) 08/01/2016  . Anxiety 02/22/2016  . Chronic respiratory failure ([place]) 08/10/2015  . Hypoglycemia 05/12/2015  . Type 2 diabetes mellitus ([place]) 05/12/2015  . HTN (hypertension) 05/12/2015  . CAD (coronary artery disease) 05/12/2015  . Atrial flutter ([place]) 05/12/2015  . GERD (gastroesophageal reflux disease) 05/12/2015  . Solitary pulmonary nodule  02/10/2015  . OSA on CPAP 08/17/2014  . COPD (chronic obstructive pulmonary disease) ([place]) 08/17/2014    Past Surgical History:  Procedure Laterality Date  . CLAVICLE SURGERY    . heart bypass    . HERNIA REPAIR    . MOHS SURGERY  07/10/2017   Head    Allergies Codeine, Hydralazine, and Penicillins  Social History Social History   Tobacco Use  . Smoking status: Former Smoker    Packs/day: 1.00    Years: 30.00    Pack years: 30.00    Types: Cigarettes    Quit date: 12/14/2001    Years since quitting: 17.0  . Smokeless tobacco: Never Used  . Tobacco comment: quit 12/14/2001  Substance Use Topics  . Alcohol use: No    Alcohol/week: 0.0 standard drinks  . Drug use: No   Review of Systems Constitutional: Negative for fever. Cardiovascular: Negative for chest pain. Respiratory: Positive for shortness of breath Gastrointestinal: Negative for abdominal pain, positive for vomiting with a possible hematemesis component Musculoskeletal: Negative for back pain. Skin: Negative for rash. Neurological: Negative for headaches, focal weakness or numbness.  All systems negative/normal/unremarkable except as stated in the HPI  ____________________________________________   PHYSICAL EXAM:  VITAL SIGNS: ED Triage Vitals  Enc Vitals Group     BP 01/03/19 1247 (!) 146/51     Pulse Rate 01/03/19 1247 69     Resp 01/03/19 1247 (!) 26     Temp 01/03/19 1247 99.7 F (37.6 C)     Temp Source 01/03/19 1247 Oral     SpO2 01/03/19 1246 (!)  89 %     Weight 01/03/19 1249 215 lb (97.5 kg)     Height 01/03/19 1249 6' (1.829 m)     Head Circumference --      Peak Flow --      Pain Score 01/03/19 1249 0     Pain Loc --      Pain Edu? --      Excl. in [place]? --     Constitutional: Alert and oriented.  Chronically ill-appearing, no distress Eyes: Conjunctivae are normal. Normal extraocular movements. ENT      Head: Normocephalic and atraumatic.      Nose: No congestion/rhinnorhea.       Mouth/Throat: Mucous membranes are moist.      Neck: No stridor. Cardiovascular: Normal rate, regular rhythm. No murmurs, rubs, or gallops. Respiratory: Mild tachypnea, rales on the right greater than the left Gastrointestinal: Soft and nontender. Normal bowel sounds Musculoskeletal: Nontender with normal range of motion in extremities. No lower extremity tenderness nor edema. Neurologic:  Normal speech and language. No gross focal neurologic deficits are appreciated.  Skin:  Skin is warm, dry and intact. No rash noted. Psychiatric: Mood and affect are normal. Speech and behavior are normal.  ____________________________________________  EKG: Interpreted by me.  Atrial flutter with a rate of 73 bpm, left bundle branch block, left axis deviation, borderline long QT  ____________________________________________  ED COURSE:  As part of my medical decision making, I reviewed the following data within the [facility] History obtained from family if available, nursing notes, old chart and ekg, as well as notes from prior ED visits. Patient presented for dyspnea and possible hematemesis, we will assess with labs and imaging as indicated at this time.   Procedures  Ernest Mclean was evaluated in Emergency Department on 01/03/2019 for the symptoms described in the history of present illness. He was evaluated in the context of the global COVID-19 pandemic, which necessitated consideration that the patient might be at risk for infection with the SARS-CoV-2 virus that causes COVID-19. Institutional protocols and algorithms that pertain to the evaluation of patients at risk for COVID-19 are in a state of rapid change based on information released by regulatory bodies including the CDC and federal and state organizations. These policies and algorithms were followed during the patient's care in the ED.  ____________________________________________   LABS (pertinent  positives/negatives)  Labs Reviewed  CBC WITH DIFFERENTIAL/PLATELET - Abnormal; Notable for the following components:      Result Value   WBC 12.3 (*)    Platelets 125 (*)    Neutro Abs 10.7 (*)    Lymphs Abs 0.6 (*)    All other components within normal limits  BASIC METABOLIC PANEL - Abnormal; Notable for the following components:   Glucose, Bld 101 (*)    BUN 44 (*)    Creatinine, Ser 1.82 (*)    GFR calc non Af Amer 35 (*)    GFR calc Af Amer 41 (*)    All other components within normal limits  CULTURE, BLOOD (ROUTINE X 2)  CULTURE, BLOOD (ROUTINE X 2)  SARS CORONAVIRUS 2 (HOSPITAL ORDER, [place] [facility])  LACTIC ACID, PLASMA  BRAIN NATRIURETIC PEPTIDE  BLOOD GAS, VENOUS  TROPONIN I (HIGH SENSITIVITY)   CRITICAL CARE Performed by: Laurence Aly   Total critical care time: 30 minutes  Critical care time was exclusive of separately billable procedures and treating other patients.  Critical care was necessary to  treat or prevent imminent or life-threatening deterioration.  Critical care was time spent personally by me on the following activities: development of treatment plan with patient and/or surrogate as well as nursing, discussions with consultants, evaluation of patient's response to treatment, examination of patient, obtaining history from patient or surrogate, ordering and performing treatments and interventions, ordering and review of laboratory studies, ordering and review of radiographic studies, pulse oximetry and re-evaluation of patient's condition.  RADIOLOGY Images were viewed by me  Chest x-ray IMPRESSION:  1. Multilobar pneumonia involving the right upper and lower lobes.  Followup PA and lateral chest X-ray is recommended in 3-4 weeks  following trial of antibiotic therapy to ensure resolution and  exclude underlying malignancy.  ____________________________________________   DIFFERENTIAL DIAGNOSIS   CHF, COPD,  pneumonia, coronavirus, PE, aspiration  FINAL ASSESSMENT AND PLAN  Multi lobar pneumonia, hypoxemia   Plan: The patient had presented for possible hematemesis and hypoxia. Patient's labs revealed leukocytosis and chronic kidney disease, initial lactic acid level was negative. Patient's imaging revealed a multi lobar pneumonia on the right side consistent with his examination and hypoxia.  He was started on broad-spectrum antibiotics and seems remarkably stable on 4 L of oxygen right now.  I will discuss with the hospitalist for admission.   Laurence Aly, MD    Note: This note was generated in part or whole with voice recognition software. Voice recognition is usually quite accurate but there are transcription errors that can and very often do occur. I apologize for any typographical errors that were not detected and corrected.     Earleen Newport, MD 01/03/19 1343

## 2019-01-04 ENCOUNTER — Inpatient Hospital Stay: Payer: Medicare Other

## 2019-01-04 ENCOUNTER — Other Ambulatory Visit: Payer: Self-pay

## 2019-01-04 LAB — BLOOD CULTURE ID PANEL (REFLEXED)

## 2019-01-04 LAB — BASIC METABOLIC PANEL
Anion gap: 9 (ref 5–15)
BUN: 51 mg/dL — ABNORMAL HIGH (ref 8–23)
CO2: 24 mmol/L (ref 22–32)
Calcium: 8.3 mg/dL — ABNORMAL LOW (ref 8.9–10.3)
Chloride: 102 mmol/L (ref 98–111)
Creatinine, Ser: 2.09 mg/dL — ABNORMAL HIGH (ref 0.61–1.24)
GFR calc Af Amer: 35 mL/min — ABNORMAL LOW (ref >60)
GFR calc non Af Amer: 30 mL/min — ABNORMAL LOW (ref >60)
Glucose, Bld: 414 mg/dL — ABNORMAL HIGH (ref 70–99)
Potassium: 4.5 mmol/L (ref 3.5–5.1)
Sodium: 135 mmol/L (ref 135–145)

## 2019-01-04 LAB — GLUCOSE, CAPILLARY
Glucose-Capillary: 357 mg/dL — ABNORMAL HIGH (ref 70–99)
Glucose-Capillary: 369 mg/dL — ABNORMAL HIGH (ref 70–99)
Glucose-Capillary: 401 mg/dL — ABNORMAL HIGH (ref 70–99)
Glucose-Capillary: 429 mg/dL — ABNORMAL HIGH (ref 70–99)
Glucose-Capillary: 396 mg/dL — ABNORMAL HIGH (ref 70–99)

## 2019-01-04 LAB — OCCULT BLOOD X 1 CARD TO LAB, STOOL: Fecal Occult Bld: NEGATIVE

## 2019-01-04 LAB — CBC
HCT: 38.2 % — ABNORMAL LOW (ref 39.0–52.0)
Hemoglobin: 12.5 g/dL — ABNORMAL LOW (ref 13.0–17.0)
MCH: 28.2 pg (ref 26.0–34.0)
MCHC: 32.7 g/dL (ref 30.0–36.0)
MCV: 86 fL (ref 80.0–100.0)
Platelets: 121 10*3/uL — ABNORMAL LOW (ref 150–400)
RBC: 4.44 MIL/uL (ref 4.22–5.81)
RDW: 14.8 % (ref 11.5–15.5)
WBC: 12.4 10*3/uL — ABNORMAL HIGH (ref 4.0–10.5)
nRBC: 0 % (ref 0.0–0.2)

## 2019-01-04 LAB — MRSA PCR SCREENING: MRSA by PCR: NEGATIVE

## 2019-01-04 MED ORDER — INSULIN ASPART 100 UNIT/ML ~~LOC~~ SOLN
20.0000 [IU] | Freq: Once | SUBCUTANEOUS | Status: AC
  Administered 2019-01-04: 13:00:00 20 [IU] via SUBCUTANEOUS
  Filled 2019-01-04: qty 1

## 2019-01-04 MED ORDER — INSULIN ASPART 100 UNIT/ML ~~LOC~~ SOLN
7.0000 [IU] | Freq: Three times a day (TID) | SUBCUTANEOUS | Status: DC
  Administered 2019-01-04 – 2019-01-06 (×6): 7 [IU] via SUBCUTANEOUS
  Filled 2019-01-04 (×5): qty 1

## 2019-01-04 MED ORDER — INSULIN GLARGINE 100 UNIT/ML ~~LOC~~ SOLN
60.0000 [IU] | Freq: Every day | SUBCUTANEOUS | Status: DC
  Administered 2019-01-04 – 2019-01-05 (×2): 60 [IU] via SUBCUTANEOUS
  Filled 2019-01-04 (×4): qty 0.6

## 2019-01-04 MED ORDER — INSULIN ASPART 100 UNIT/ML ~~LOC~~ SOLN
20.0000 [IU] | Freq: Once | SUBCUTANEOUS | Status: AC
  Administered 2019-01-04: 20 [IU] via SUBCUTANEOUS
  Filled 2019-01-04: qty 1

## 2019-01-04 MED ORDER — APIXABAN 2.5 MG PO TABS
2.5000 mg | ORAL_TABLET | Freq: Two times a day (BID) | ORAL | Status: DC
  Administered 2019-01-05 – 2019-01-06 (×3): 2.5 mg via ORAL
  Filled 2019-01-04 (×3): qty 1

## 2019-01-04 NOTE — Progress Notes (Signed)
[place] [facility] [place] NAME: Ernest Mclean    MR#:  [id number]  DATE OF BIRTH:  [birth year]  SUBJECTIVE:  CHIEF COMPLAINT:   Chief Complaint  Patient presents with  . Shortness of Breath   -Came in from endocrinology office yesterday for hypoxia and shortness of breath started abruptly. -On 4 L oxygen which is acute -Feels some better today  REVIEW OF SYSTEMS:  Review of Systems  Constitutional: Positive for malaise/fatigue. Negative for chills and fever.  HENT: Negative for congestion, ear discharge, hearing loss and nosebleeds.   Respiratory: Positive for shortness of breath and wheezing. Negative for cough.   Cardiovascular: Negative for chest pain and palpitations.  Gastrointestinal: Positive for heartburn and vomiting. Negative for abdominal pain, constipation, diarrhea and nausea.  Genitourinary: Negative for dysuria.  Musculoskeletal: Negative for myalgias.  Neurological: Positive for weakness. Negative for dizziness, focal weakness, seizures and headaches.  Psychiatric/Behavioral: Negative for depression.    DRUG ALLERGIES:   Allergies  Allergen Reactions  . Codeine Hives, Rash and Swelling  . Hydralazine Other (See Comments)    tongue swollen and couldn't wake up ---- not positive it was this or a mix of this with something else or high sugar  . Penicillins Other (See Comments)    Passed out (at 78 yrs old) Has patient had a PCN reaction causing immediate rash, facial/tongue/throat swelling, SOB or lightheadedness with hypotension: No Has patient had a PCN reaction causing severe rash involving mucus membranes or skin necrosis: No Has patient had a PCN reaction that required hospitalization No Has patient had a PCN reaction occurring within the last 10 years: No If all of the above answers are "NO", then may proceed with Cephalosporin use.     VITALS:  Blood pressure (!) 151/67, pulse 62, temperature 98.9 F (37.2 C),  resp. rate 20, height 6' (1.829 m), weight 99.9 kg, SpO2 100 %.  PHYSICAL EXAMINATION:  Physical Exam  GENERAL:  78 y.o.-year-old obese patient sitting in the bed with no acute distress.  EYES: Pupils equal, round, reactive to light and accommodation. No scleral icterus. Extraocular muscles intact.  HEENT: Head atraumatic, normocephalic. Oropharynx and nasopharynx clear.  NECK:  Supple, no jugular venous distention. No thyroid enlargement, no tenderness.  LUNGS: Coarse breath sounds with rhonchi all over right lung field and at left base, no other wheezing, rales or crepitation. No use of accessory muscles of respiration.  CARDIOVASCULAR: S1, S2 normal. No rubs, or gallops.  3/6 systolic murmur is present ABDOMEN: Soft, nontender, nondistended. Bowel sounds present. No organomegaly or mass.  EXTREMITIES: No pedal edema, cyanosis, or clubbing.  NEUROLOGIC: Cranial nerves II through XII are intact. Muscle strength 5/5 in all extremities. Sensation intact. Gait not checked.  PSYCHIATRIC: The patient is alert and oriented x 3.  SKIN: No obvious rash, lesion, or ulcer.    LABORATORY PANEL:   CBC Recent Labs  Lab 01/04/19 0554  WBC 12.4*  HGB 12.5*  HCT 38.2*  PLT 121*   ------------------------------------------------------------------------------------------------------------------  Chemistries  Recent Labs  Lab 01/04/19 0554  NA 135  K 4.5  CL 102  CO2 24  GLUCOSE 414*  BUN 51*  CREATININE 2.09*  CALCIUM 8.3*   ------------------------------------------------------------------------------------------------------------------  Cardiac Enzymes No results for input(s): TROPONINI in the last 168 hours. ------------------------------------------------------------------------------------------------------------------  RADIOLOGY:  Dg Chest Port 1 View  Result Date: 01/03/2019 CLINICAL DATA:  Shortness of breath and hematemesis. EXAM: PORTABLE CHEST 1 VIEW COMPARISON:  Chest  x-ray dated August 01, 2016. FINDINGS: Stable cardiomediastinal silhouette status post CABG. Normal pulmonary vascularity. New consolidation in the right upper and lower lobes. The left lung is clear. No pleural effusion or pneumothorax. No acute osseous abnormality. IMPRESSION: 1. Multilobar pneumonia involving the right upper and lower lobes. Followup PA and lateral chest X-ray is recommended in 3-4 weeks following trial of antibiotic therapy to ensure resolution and exclude underlying malignancy. Electronically Signed   By: Titus Dubin M.D.   On: 01/03/2019 13:15    EKG:   Orders placed or performed during the hospital encounter of 01/03/19  . EKG 12-Lead  . EKG 12-Lead  . ED EKG  . ED EKG    ASSESSMENT AND PLAN:   78 year old male with past medical history significant for CKD stage III, chronic systolic CHF, A. fib on Eliquis, hypertension, COPD on nocturnal home O2, sleep apnea, diabetes presents to hospital secondary to hypoxia and weakness.  1.  Acute hypoxic respiratory failure-chest x-ray showing multilobar pneumonia especially worse in the right lung. -CT chest without contrast pending -Currently needing 4 L oxygen acutely, wean as tolerated.  Patient on 2 L at bedtime -MRSA PCR is negative.  Currently on Zosyn.  1 out of 4 blood cultures positive for Staphylococcus species-likely contaminant.  Continue to monitor -Continue duo nebs. -On IV steroids-change to oral and taper off, check procalcitonin  2.  Chronic systolic CHF-stable.  Euvolemic, continue oral Lasix  3.  Diabetes mellitus-continue Lantus, pre-meal NovoLog and sliding scale insulin  4.  Episode of hemoptysis yesterday-hemoglobin is stable and at baseline of 12.  No further episodes.  Stool for occult blood is negative.  On IV Protonix-continue for now.  Outpatient follow-up recommended  5.  DVT prophylaxis-patient's Eliquis has been on hold.  Need to be resumed since no further hematemesis and hemoglobin is  stable  Encourage ambulation Wife present at bedside and updated    All the records are reviewed and case discussed with Care Management/Social Workerr. Management plans discussed with the patient, family and they are in agreement.  CODE STATUS: Full code  TOTAL TIME TAKING CARE OF THIS PATIENT: 39 minutes.   POSSIBLE D/C IN 2 DAYS, DEPENDING ON CLINICAL CONDITION.   Gladstone Lighter M.D on 01/04/2019 at 2:26 PM  Between 7am to 6pm - Pager - [phone]  After 6pm go to [email or web address] - password [email or web address] [facility]  Office  [phone]  CC: Primary care physician; Juluis Pitch, MD

## 2019-01-04 NOTE — TOC Initial Note (Signed)
Transition of Care [facility]) - Initial/Assessment Note    Patient Details  Name: Ernest Mclean MRN: [id number] Date of Birth: 18-Jun-1940  Transition of [facility]) CM/SW Contact:    Shelbie Hutching, RN Phone Number: 01/04/2019, 3:04 PM  Clinical Narrative:                 Patient is from home and lives with his wife admitted with community acquired pneumonia.  Patient is on O2 at night 2L from [employer].  Patient has a Marine scientist and an emergency oxygen tank at home.  Patient also has CPAP and a nebulizer at home.  Patient has had home health services in the past with [facility] and wound like to use them again.  Floydene Flock with [facility] given referral.  Patient denies need for any DME.  RNCM will cont to follow and assist with any additional discharge needs if they arise.   Expected Discharge Plan: [place] Barriers to Discharge: Continued Medical Work up   Patient Goals and CMS Choice Patient states their goals for this hospitalization and ongoing recovery are:: To not have to come back to the hospital CMS Medicare.gov Compare Post Acute Care list provided to:: Patient Choice offered to / list presented to : Patient  Expected Discharge Plan and Services Expected Discharge Plan: [place]   Discharge Planning Services: CM Consult Post Acute Care Choice: [place] arrangements for the past 2 months: [place] Arranged: RN, PT, OT [facility] Agency: [place] ([place]) Date [place]: 01/04/19 Time [place]: [place] Representative spoke with at [place]: Floydene Flock  Prior Living Arrangements/Services Living arrangements for the past 2 months: [place] with:: Spouse Patient language and need for interpreter reviewed:: No Do you feel safe going back to the place where you live?: Yes      Need for Family Participation in Patient Care: Yes  (Comment) Care giver support system in place?: Yes (comment)(wife) Current home services: DME(oxygen, CPAP, nebulilzer) Criminal Activity/Legal Involvement Pertinent to Current Situation/Hospitalization: No - Comment as needed  Activities of Daily Living Home Assistive Devices/Equipment: None ADL Screening (condition at time of admission) Patient's cognitive ability adequate to safely complete daily activities?: Yes Is the patient deaf or have difficulty hearing?: No Does the patient have difficulty seeing, even when wearing glasses/contacts?: No Does the patient have difficulty concentrating, remembering, or making decisions?: No Patient able to express need for assistance with ADLs?: Yes Does the patient have difficulty dressing or bathing?: Yes Independently performs ADLs?: Yes (appropriate for developmental age) Does the patient have difficulty walking or climbing stairs?: Yes Weakness of Legs: None Weakness of Arms/Hands: None  Permission Sought/Granted Permission sought to share information with : Case Manager, Other (comment) Permission granted to share information with : Yes, Verbal Permission Granted     Permission granted to share info w AGENCY: [facility]        Emotional Assessment Appearance:: Appears stated age Attitude/Demeanor/Rapport: Engaged Affect (typically observed): Accepting Orientation: : Oriented to Self, Oriented to Place, Oriented to  Time, Oriented to Situation Alcohol / Substance Use: Not Applicable Psych Involvement: No (comment)  Admission diagnosis:  Hypoxia [R09.02] Community acquired pneumonia, unspecified laterality [J18.9] Patient Active Problem List   Diagnosis Date Noted  .  Pneumonia 01/03/2019  . 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 Atherosclerosis of native arteries of extremity with intermittent claudication ([place]) 05/08/2017  . Atherosclerosis of native arteries of the extremities with ulceration ([place])  03/20/2017  . CKD (chronic kidney disease) 03/20/2017  . CHF (congestive heart failure) ([place]) 08/01/2016  . Anxiety 02/22/2016  . Chronic respiratory failure ([place]) 08/10/2015  . Hypoglycemia 05/12/2015  . Type 2 diabetes mellitus ([place]) 05/12/2015  . HTN (hypertension) 05/12/2015  . CAD (coronary artery disease) 05/12/2015  . Atrial flutter ([place]) 05/12/2015  . GERD (gastroesophageal reflux disease) 05/12/2015  . Solitary pulmonary nodule 02/10/2015  . OSA on CPAP 08/17/2014  . COPD (chronic obstructive pulmonary disease) ([place]) 08/17/2014   PCP:  Juluis Pitch, MD Pharmacy:   [facility] [phone], [place] [address] Phone: [phone] Fax: [phone]     Social Determinants of Health (SDOH) Interventions    Readmission Risk Interventions No flowsheet data found.

## 2019-01-04 NOTE — Progress Notes (Signed)
PHARMACY - PHYSICIAN COMMUNICATION CRITICAL VALUE ALERT - BLOOD CULTURE IDENTIFICATION (BCID)  CHIJIOKE LASSER is an 78 y.o. male who presented to [facility] on 01/03/2019 with a chief complaint of vomiting blood  Assessment: 1/4 bottles staph species, mecA negative  Name of physician (or Provider) Contacted: Dr. Tressia Miners  Current antibiotics: Zosyn  Changes to prescribed antibiotics recommended: continue Zosyn, suspect likely contaminant  Results for orders placed or performed during the hospital encounter of 01/03/19  Blood Culture ID Panel (Reflexed) (Collected: 01/03/2019  1:05 PM)  Result Value Ref Range   Enterococcus species NOT DETECTED NOT DETECTED   Listeria monocytogenes NOT DETECTED NOT DETECTED   Staphylococcus species DETECTED (A) NOT DETECTED   Staphylococcus aureus (BCID) NOT DETECTED NOT DETECTED   Methicillin resistance NOT DETECTED NOT DETECTED   Streptococcus species NOT DETECTED NOT DETECTED   Streptococcus agalactiae NOT DETECTED NOT DETECTED   Streptococcus pneumoniae NOT DETECTED NOT DETECTED   Streptococcus pyogenes NOT DETECTED NOT DETECTED   Acinetobacter baumannii NOT DETECTED NOT DETECTED   Enterobacteriaceae species NOT DETECTED NOT DETECTED   Enterobacter cloacae complex NOT DETECTED NOT DETECTED   Escherichia coli NOT DETECTED NOT DETECTED   Klebsiella oxytoca NOT DETECTED NOT DETECTED   Klebsiella pneumoniae NOT DETECTED NOT DETECTED   Proteus species NOT DETECTED NOT DETECTED   Serratia marcescens NOT DETECTED NOT DETECTED   Haemophilus influenzae NOT DETECTED NOT DETECTED   Neisseria meningitidis NOT DETECTED NOT DETECTED   Pseudomonas aeruginosa NOT DETECTED NOT DETECTED   Candida albicans NOT DETECTED NOT DETECTED   Candida glabrata NOT DETECTED NOT DETECTED   Candida krusei NOT DETECTED NOT DETECTED   Candida parapsilosis NOT DETECTED NOT DETECTED   Candida tropicalis NOT DETECTED NOT DETECTED    Tawnya Crook, PharmD 01/04/2019  10:32  AM

## 2019-01-04 NOTE — Plan of Care (Signed)

## 2019-01-05 LAB — GLUCOSE, CAPILLARY
Glucose-Capillary: 332 mg/dL — ABNORMAL HIGH (ref 70–99)
Glucose-Capillary: 347 mg/dL — ABNORMAL HIGH (ref 70–99)
Glucose-Capillary: 366 mg/dL — ABNORMAL HIGH (ref 70–99)
Glucose-Capillary: 375 mg/dL — ABNORMAL HIGH (ref 70–99)
Glucose-Capillary: 389 mg/dL — ABNORMAL HIGH (ref 70–99)
Glucose-Capillary: 419 mg/dL — ABNORMAL HIGH (ref 70–99)
Glucose-Capillary: 423 mg/dL — ABNORMAL HIGH (ref 70–99)

## 2019-01-05 LAB — BASIC METABOLIC PANEL
Anion gap: 6 (ref 5–15)
BUN: 50 mg/dL — ABNORMAL HIGH (ref 8–23)
CO2: 25 mmol/L (ref 22–32)
Calcium: 8.4 mg/dL — ABNORMAL LOW (ref 8.9–10.3)
Chloride: 103 mmol/L (ref 98–111)
Creatinine, Ser: 1.71 mg/dL — ABNORMAL HIGH (ref 0.61–1.24)
GFR calc Af Amer: 44 mL/min — ABNORMAL LOW (ref >60)
GFR calc non Af Amer: 38 mL/min — ABNORMAL LOW (ref >60)
Glucose, Bld: 384 mg/dL — ABNORMAL HIGH (ref 70–99)
Potassium: 4.6 mmol/L (ref 3.5–5.1)
Sodium: 134 mmol/L — ABNORMAL LOW (ref 135–145)

## 2019-01-05 LAB — CBC
HCT: 38 % — ABNORMAL LOW (ref 39.0–52.0)
Hemoglobin: 12.5 g/dL — ABNORMAL LOW (ref 13.0–17.0)
MCH: 28.6 pg (ref 26.0–34.0)
MCHC: 32.9 g/dL (ref 30.0–36.0)
MCV: 87 fL (ref 80.0–100.0)
Platelets: 154 10*3/uL (ref 150–400)
RBC: 4.37 MIL/uL (ref 4.22–5.81)
RDW: 14.6 % (ref 11.5–15.5)
WBC: 14.7 10*3/uL — ABNORMAL HIGH (ref 4.0–10.5)
nRBC: 0 % (ref 0.0–0.2)

## 2019-01-05 MED ORDER — INSULIN ASPART 100 UNIT/ML ~~LOC~~ SOLN
0.0000 [IU] | Freq: Three times a day (TID) | SUBCUTANEOUS | Status: DC
  Administered 2019-01-05: 17:00:00 11 [IU] via SUBCUTANEOUS
  Administered 2019-01-06: 12:00:00 5 [IU] via SUBCUTANEOUS
  Administered 2019-01-06: 8 [IU] via SUBCUTANEOUS
  Filled 2019-01-05 (×3): qty 1

## 2019-01-05 MED ORDER — INSULIN ASPART 100 UNIT/ML ~~LOC~~ SOLN
20.0000 [IU] | Freq: Once | SUBCUTANEOUS | Status: AC
  Administered 2019-01-05: 20 [IU] via SUBCUTANEOUS
  Filled 2019-01-05: qty 1

## 2019-01-05 MED ORDER — PREDNISONE 50 MG PO TABS: 50.0000 mg | ORAL_TABLET | Freq: Every day | ORAL | Status: DC

## 2019-01-05 MED ORDER — SODIUM CHLORIDE 0.9 % IV SOLN
3.0000 g | Freq: Four times a day (QID) | INTRAVENOUS | Status: DC
  Administered 2019-01-05 – 2019-01-06 (×3): 3 g via INTRAVENOUS
  Filled 2019-01-05 (×3): qty 8
  Filled 2019-01-05 (×2): qty 3
  Filled 2019-01-05: qty 8
  Filled 2019-01-05: qty 3

## 2019-01-05 MED ORDER — INSULIN ASPART 100 UNIT/ML ~~LOC~~ SOLN
0.0000 [IU] | Freq: Every day | SUBCUTANEOUS | Status: DC
  Administered 2019-01-05: 22:00:00 5 [IU] via SUBCUTANEOUS
  Filled 2019-01-05: qty 1

## 2019-01-05 NOTE — Progress Notes (Signed)
Pt ambulated in hall on RA with pulse ox 87%  RA.

## 2019-01-05 NOTE — Progress Notes (Signed)
Ernest Mclean at [place] NAME: Ernest Mclean    MR#:  [id number]  DATE OF BIRTH:  11/22/1940  SUBJECTIVE:   -Came in from endocrinology office yesterday for hypoxia and shortness of breath started abruptly. -On 4 L oxygen which is acute--no on 2liters -Feels better today -wife in the room REVIEW OF SYSTEMS:  Review of Systems  Constitutional: Positive for malaise/fatigue. Negative for chills and fever.  HENT: Negative for congestion, ear discharge, hearing loss and nosebleeds.   Respiratory: Positive for shortness of breath and wheezing. Negative for cough.   Cardiovascular: Negative for chest pain and palpitations.  Gastrointestinal: Positive for heartburn and vomiting. Negative for abdominal pain, constipation, diarrhea and nausea.  Genitourinary: Negative for dysuria.  Musculoskeletal: Negative for myalgias.  Neurological: Positive for weakness. Negative for dizziness, focal weakness, seizures and headaches.  Psychiatric/Behavioral: Negative for depression.    DRUG ALLERGIES:   Allergies  Allergen Reactions  . Codeine Hives, Rash and Swelling  . Hydralazine Other (See Comments)    tongue swollen and couldn't wake up ---- not positive it was this or a mix of this with something else or high sugar  . Penicillins Other (See Comments)    Passed out (at 78 yrs old) Has patient had a PCN reaction causing immediate rash, facial/tongue/throat swelling, SOB or lightheadedness with hypotension: No Has patient had a PCN reaction causing severe rash involving mucus membranes or skin necrosis: No Has patient had a PCN reaction that required hospitalization No Has patient had a PCN reaction occurring within the last 10 years: No If all of the above answers are "NO", then may proceed with Cephalosporin use.     VITALS:  Blood pressure (!) 161/64, pulse 65, temperature 98.7 F (37.1 C), resp. rate 14, height 6' (1.829 m), weight 99.3 kg, SpO2 95  %.  PHYSICAL EXAMINATION:  Physical Exam  GENERAL:  78 y.o.-year-old obese patient sitting in the bed with no acute distress.  EYES: Pupils equal, round, reactive to light and accommodation. No scleral icterus. Extraocular muscles intact.  HEENT: Head atraumatic, normocephalic. Oropharynx and nasopharynx clear.  NECK:  Supple, no jugular venous distention. No thyroid enlargement, no tenderness.  LUNGS: Coarse breath sounds with rhonchi all over right lung field and at left base, no other wheezing, rales or crepitation. No use of accessory muscles of respiration.  CARDIOVASCULAR: S1, S2 normal. No rubs, or gallops.  3/6 systolic murmur is present ABDOMEN: Soft, nontender, nondistended. Bowel sounds present. No organomegaly or mass.  EXTREMITIES: No pedal edema, cyanosis, or clubbing.  NEUROLOGIC: Cranial nerves II through XII are intact. Muscle strength 5/5 in all extremities. Sensation intact. Gait not checked.  PSYCHIATRIC: The patient is alert and oriented x 3.  SKIN: No obvious rash, lesion, or ulcer.    LABORATORY PANEL:   CBC Recent Labs  Lab 01/05/19 0558  WBC 14.7*  HGB 12.5*  HCT 38.0*  PLT 154   ------------------------------------------------------------------------------------------------------------------  Chemistries  Recent Labs  Lab 01/05/19 0558  NA 134*  K 4.6  CL 103  CO2 25  GLUCOSE 384*  BUN 50*  CREATININE 1.71*  CALCIUM 8.4*   ------------------------------------------------------------------------------------------------------------------  Cardiac Enzymes No results for input(s): TROPONINI in the last 168 hours. ------------------------------------------------------------------------------------------------------------------  RADIOLOGY:  Ct Chest Wo Contrast  Result Date: 01/04/2019 CLINICAL DATA:  PT states here to follow up pneumonia. Pt states he has been on antibiotics.Pneumonia, unresolved or complicated h/o lung nodules EXAM: CT CHEST  WITHOUT CONTRAST TECHNIQUE:  Multidetector CT imaging of the chest was performed following the standard protocol without IV contrast. COMPARISON:  Chest radiograph 01/03/2019, CT 05/29/2016 FINDINGS: Cardiovascular: Coronary artery calcification and aortic atherosclerotic calcification. Mediastinum/Nodes: No axillary supraclavicular adenopathy. Borderline enlarged RIGHT lower paratracheal node measures 11 mm. RIGHT hilar node measures 10 mm. Subluxed normal.  No pericardial effusion Lungs/Pleura: Dense consolidation in the RIGHT upper lobe with air bronchograms. There is peripheral ground-glass airspace disease surrounding the dense consolidation. Similar findings in the RIGHT lower lobe with peribronchial patchy airspace consolidations. Mild consolidation at the LEFT lung base. No pleural fluid.  No pneumothorax. Upper Abdomen: Limited view of the liver, kidneys, pancreas are unremarkable. Normal adrenal glands. Musculoskeletal: No aggressive osseous lesion. IMPRESSION: 1. Multifocal bronchopneumonia most dense in the RIGHT upper lobe but also widespread in the RIGHT lower lobe. Mild involvement of the LEFT lower lobe. 2. Mild mediastinal adenopathy is favored reactive. Electronically Signed   By: Suzy Bouchard M.D.   On: 01/04/2019 15:32    EKG:   Orders placed or performed during the hospital encounter of 01/03/19  . EKG 12-Lead  . EKG 12-Lead  . ED EKG  . ED EKG    ASSESSMENT AND PLAN:   78 year old male with past medical history significant for CKD stage III, chronic systolic CHF, A. fib on Eliquis, hypertension, COPD on nocturnal home O2, sleep apnea, diabetes presents to hospital secondary to hypoxia and weakness.  1.  Acute hypoxic respiratory failure-chest x-ray showing multilobar pneumonia especially worse in the right lung. -CT chest without contrast shows multifocal PNA -Currently weaned to 2 L oxygen acutely, wean as tolerated.  -MRSA PCR is negative.  Currently on Zosyn--change to  Augmentin -  1 out of 4 blood cultures positive for Staphylococcus species-likely contaminant.   -Continue duo nebs. -po steroid  2.  Chronic systolic CHF-stable.  Euvolemic, continue oral Lasix  3.  Diabetes mellitus-continue Lantus, pre-meal NovoLog and sliding scale insulin  4.  Episode of hemoptysis yesterday-hemoglobin is stable and at baseline of 12.  No further episodes.  Stool for occult blood is negative.  On IV Protonix-continue for now.  Outpatient follow-up recommended  5.  DVT prophylaxis-patient's Eliquis has been on hold.  Need to be resumed since no further hematemesis and hemoglobin is stable  Encourage ambulation Wife present at bedside and updated continues to show improvement discharged tomorrow.  Management plans discussed with the patient, family and they are in agreement.  CODE STATUS: Full code  TOTAL TIME TAKING CARE OF THIS PATIENT: 39 minutes.   POSSIBLE D/C IN 1 DAYS, DEPENDING ON CLINICAL CONDITION.   Fritzi Mandes M.D on 01/05/2019 at 2:08 PM  Between 7am to 6pm - Pager - [phone]  After 6pm go to [email or web address] - password [email or web address] [place]  Office  [phone]  CC: Primary care physician; Juluis Pitch, MD

## 2019-01-05 NOTE — Plan of Care (Signed)

## 2019-01-06 LAB — CULTURE, BLOOD (ROUTINE X 2)

## 2019-01-06 LAB — GLUCOSE, CAPILLARY
Glucose-Capillary: 391 mg/dL — ABNORMAL HIGH (ref 70–99)
Glucose-Capillary: 344 mg/dL — ABNORMAL HIGH (ref 70–99)
Glucose-Capillary: 321 mg/dL — ABNORMAL HIGH (ref 70–99)
Glucose-Capillary: 295 mg/dL — ABNORMAL HIGH (ref 70–99)
Glucose-Capillary: 245 mg/dL — ABNORMAL HIGH (ref 70–99)

## 2019-01-06 MED ORDER — AMOXICILLIN-POT CLAVULANATE 875-125 MG PO TABS: 1.0000 | ORAL_TABLET | Freq: Two times a day (BID) | ORAL | 0 refills | Status: DC

## 2019-01-06 MED ORDER — PREDNISONE 20 MG PO TABS: 40.0000 mg | ORAL_TABLET | Freq: Every day | ORAL | Status: DC

## 2019-01-06 MED ORDER — AMOXICILLIN-POT CLAVULANATE 875-125 MG PO TABS
1.0000 | ORAL_TABLET | Freq: Two times a day (BID) | ORAL | Status: DC
  Administered 2019-01-06: 10:00:00 1 via ORAL
  Filled 2019-01-06: qty 1

## 2019-01-06 MED ORDER — INSULIN ASPART 100 UNIT/ML ~~LOC~~ SOLN
10.0000 [IU] | Freq: Once | SUBCUTANEOUS | Status: AC
  Administered 2019-01-06: 10 [IU] via SUBCUTANEOUS
  Filled 2019-01-06: qty 1

## 2019-01-06 MED ORDER — PREDNISONE 10 MG PO TABS: 30.0000 mg | ORAL_TABLET | Freq: Every day | ORAL | 0 refills | Status: DC

## 2019-01-06 NOTE — Plan of Care (Signed)

## 2019-01-06 NOTE — Care Management Important Message (Signed)
Important Message  Patient Details  Name: Ernest Mclean MRN: [id number] Date of Birth: [birth year]   Medicare Important Message Given:  Yes     Dannette Barbara 01/06/2019, 11:14 AM

## 2019-01-06 NOTE — Discharge Instructions (Signed)
Patient is advised to hold off on his metformin. He will discuss with primary care physician to see if his creatinine normalizes can resume it at a later date.  Use oxygen during daytime for shortness of breath as needed. Continue use of your incentive spirometer.  Continue use of fear sliding scale insulin at home according to her sugars.

## 2019-01-06 NOTE — Discharge Summary (Signed)
[facility] NAME: Ernest Mclean    MR#:  [id number]  DATE OF BIRTH:  18-Nov-1940  DATE OF ADMISSION:  01/03/2019 ADMITTING PHYSICIAN: Hillary Bow, MD  DATE OF DISCHARGE: 01/06/2019  PRIMARY CARE PHYSICIAN: Juluis Pitch, MD    ADMISSION DIAGNOSIS:  Hypoxia [R09.02] Community acquired pneumonia, unspecified laterality [J18.9]  DISCHARGE DIAGNOSIS:  multifocal pneumonia acute hypoxic respiratory failure secondary to multifocal pneumonia SECONDARY DIAGNOSIS:   Past Medical History:  Diagnosis Date  . Atrial fibrillation ([place])   . Basal cell carcinoma 06/2017   Left Ear  . CAD (coronary artery disease)   . CKD (chronic kidney disease)   . Congestive heart failure ([place])   . COPD (chronic obstructive pulmonary disease) ([place])   . Diabetes ([place])   . Hypertension   . OSA on CPAP   . 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 fib on Eliquis, hypertension, COPD on nocturnal home O2, sleep apnea, diabetes presents to hospital secondary to hypoxia and weakness.  1.  Acute hypoxic respiratory failure-chest x-ray showing multilobar pneumonia especially worse in the right lung. -CT chest without contrast shows multifocal PNA -Currently weaned to 2 L oxygen acutely, wean as tolerated.  -MRSA PCR is negative.  Currently on Zosyn--change to Augmentin (patient has listed allergy for penicillin however he is been tolerated Zosyn and Augmentin so far.) -  1 out of 4 blood cultures positive for Staphylococcus species-likely contaminant.   -Continue duo nebs. -po steroid  2.  Chronic systolic CHF-stable.  Euvolemic, continue oral Lasix  3.  Diabetes mellitus-continue Lantus, pre-meal NovoLog and sliding scale insulin -patient's metformin has been held given his creatinine 1.7. He'll discuss with PCP if it  could be resume down the road after recheck of his creatinine as outpatient.  4. Chronic a fib -continue PO cardiac meds along with eliquis.  5.  DVT prophylaxis-patient's Eliquis now resumed  Overall much better. Discharge to home. Patient agreeable. He'll follow-up with PCP as outpatient. CONSULTS OBTAINED:    DRUG ALLERGIES:   Allergies  Allergen Reactions  . Codeine Hives, Rash and Swelling  . Hydralazine Other (See Comments)    tongue swollen and couldn't wake up ---- not positive it was this or a mix of this with something else or high sugar  . Penicillins Other (See Comments)    Passed out (at 78 yrs old) Has patient had a PCN reaction causing immediate rash, facial/tongue/throat swelling, SOB or lightheadedness with hypotension: No Has patient had a PCN reaction causing severe rash involving mucus membranes or skin necrosis: No Has patient had a PCN reaction that required hospitalization No Has patient had a PCN reaction occurring within the last 10 years: No If all of the above answers are "NO", then may proceed with Cephalosporin use.     DISCHARGE MEDICATIONS:   Allergies as of 01/06/2019      Reactions   Codeine Hives, Rash, Swelling   Hydralazine Other (See Comments)   tongue swollen and couldn't wake up ---- not positive it was this or a mix of this with something else or high sugar   Penicillins Other (See Comments)   Passed out (at 78 yrs old) Has patient had a PCN reaction causing immediate rash, facial/tongue/throat swelling, SOB or lightheadedness with hypotension: No Has patient had a PCN reaction causing severe rash  involving mucus membranes or skin necrosis: No Has patient had a PCN reaction that required hospitalization No Has patient had a PCN reaction occurring within the last 10 years: No If all of the above answers are "NO", then may proceed with Cephalosporin use.      Medication List    STOP taking these medications   Fluticasone-Salmeterol  500-50 MCG/DOSE Aepb Commonly known as: Wixela Inhub   insulin lispro 100 UNIT/ML injection Commonly known as: HUMALOG   metFORMIN 500 MG tablet Commonly known as: GLUCOPHAGE     TAKE these medications   albuterol 108 (90 Base) MCG/ACT inhaler Commonly known as: VENTOLIN HFA Inhale 2 puffs into the lungs every 4 (four) hours as needed for wheezing or shortness of breath.   amLODipine 5 MG tablet Commonly known as: NORVASC Take 5 mg by mouth daily.   ammonium lactate 12 % cream Commonly known as: AMLACTIN Apply 1 g topically as needed for dry skin.   amoxicillin-clavulanate 875-125 MG tablet Commonly known as: AUGMENTIN Take 1 tablet by mouth every 12 (twelve) hours.   apixaban 5 MG Tabs tablet Commonly known as: ELIQUIS Take 2.5 mg by mouth 2 (two) times daily. 0.5 tablets b   aspirin EC 81 MG tablet Take 81 mg by mouth daily.   citalopram 20 MG tablet Commonly known as: CELEXA Take 20 mg by mouth at bedtime.   CRANBERRY PO Take 820 mg by mouth daily.   desonide 0.05 % cream Commonly known as: DESOWEN Apply 1 application topically daily as needed.   diazepam 5 MG tablet Commonly known as: VALIUM Take 5 mg by mouth every 12 (twelve) hours as needed for anxiety.   ergocalciferol 1.25 MG (50000 UT) capsule Commonly known as: VITAMIN D2 Take 50,000 Units by mouth every 30 (thirty) days.   furosemide 20 MG tablet Commonly known as: LASIX Take 20 mg by mouth daily.   insulin glargine 100 UNIT/ML injection Commonly known as: LANTUS Inject 60 Units into the skin at bedtime.   ketoconazole 2 % shampoo Commonly known as: NIZORAL Apply 1 application topically 2 (two) times a week.   lisinopril 5 MG tablet Commonly known as: ZESTRIL Take 5 mg by mouth daily.   metoprolol tartrate 100 MG tablet Commonly known as: LOPRESSOR Take 100 mg by mouth 2 (two) times daily.   OMEGA-3 FATTY ACIDS-VITAMIN E PO Take 1 capsule by mouth daily.   omeprazole 20 MG  capsule Commonly known as: PRILOSEC Take 20 mg by mouth daily.   pravastatin 20 MG tablet Commonly known as: PRAVACHOL Take 20 mg by mouth at bedtime.   predniSONE 10 MG tablet Commonly known as: DELTASONE Take 3 tablets (30 mg total) by mouth daily with breakfast. Taper by 10 mg daily then stop Start taking on: January 07, 2019   pregabalin 25 MG capsule Commonly known as: LYRICA Take 25 mg by mouth 2 (two) times daily.   tiotropium 18 MCG inhalation capsule Commonly known as: SPIRIVA Place 18 mcg into inhaler and inhale daily.   vitamin B-12 500 MCG tablet Commonly known as: CYANOCOBALAMIN Take 500 mcg by mouth daily.   vitamin C 500 MG tablet Commonly known as: ASCORBIC ACID Take 500 mg by mouth daily.       If you experience worsening of your admission symptoms, develop shortness of breath, life threatening emergency, suicidal or homicidal thoughts you must seek medical attention immediately by calling 911 or calling your MD immediately  if symptoms less severe.  You Must  read complete instructions/literature along with all the possible adverse reactions/side effects for all the Medicines you take and that have been prescribed to you. Take any new Medicines after you have completely understood and accept all the possible adverse reactions/side effects.   Please note  You were cared for by a hospitalist during your hospital stay. If you have any questions about your discharge medications or the care you received while you were in the hospital after you are discharged, you can call the unit and asked to speak with the hospitalist on call if the hospitalist that took care of you is not available. Once you are discharged, your primary care physician will handle any further medical issues. Please note that NO REFILLS for any discharge medications will be authorized once you are discharged, as it is imperative that you return to your primary care physician (or establish a  relationship with a primary care physician if you do not have one) for your aftercare needs so that they can reassess your need for medications and monitor your lab values. Today   SUBJECTIVE   No new complaints. VITAL SIGNS:  Blood pressure (!) 178/74, pulse 65, temperature 98.3 F (36.8 C), resp. rate 18, height 6' (1.829 m), weight 99 kg, SpO2 94 %.  I/O:    Intake/Output Summary (Last 24 hours) at 01/06/2019 0907 Last data filed at 01/05/2019 1700 Gross per 24 hour  Intake 722.01 ml  Output -  Net 722.01 ml    PHYSICAL EXAMINATION:  GENERAL:  78 y.o.-year-old patient lying in the bed with no acute distress.  EYES: Pupils equal, round, reactive to light and accommodation. No scleral icterus. Extraocular muscles intact.  HEENT: Head atraumatic, normocephalic. Oropharynx and nasopharynx clear.  NECK:  Supple, no jugular venous distention. No thyroid enlargement, no tenderness.  LUNGS: Normal breath sounds bilaterally, no wheezing, rales,rhonchi or crepitation. No use of accessory muscles of respiration.  CARDIOVASCULAR: S1, S2 normal. No murmurs, rubs, or gallops.  ABDOMEN: Soft, non-tender, non-distended. Bowel sounds present. No organomegaly or mass.  EXTREMITIES: No pedal edema, cyanosis, or clubbing.  NEUROLOGIC: Cranial nerves II through XII are intact. Muscle strength 5/5 in all extremities. Sensation intact. Gait not checked.  PSYCHIATRIC: The patient is alert and oriented x 3.  SKIN: No obvious rash, lesion, or ulcer.   DATA REVIEW:   CBC  Recent Labs  Lab 01/05/19 0558  WBC 14.7*  HGB 12.5*  HCT 38.0*  PLT 154    Chemistries  Recent Labs  Lab 01/05/19 0558  NA 134*  K 4.6  CL 103  CO2 25  GLUCOSE 384*  BUN 50*  CREATININE 1.71*  CALCIUM 8.4*    Microbiology Results   Recent Results (from the past 240 hour(s))  Blood culture (routine x 2)     Status: Abnormal (Preliminary result)   Collection Time: 01/03/19  1:05 PM   Specimen: BLOOD  Result  Value Ref Range Status   Specimen Description   Final    BLOOD BLOOD RIGHT FOREARM Performed at [facility], [address], [place], [place] [address]    Special Requests   Final    BOTTLES DRAWN AEROBIC AND ANAEROBIC Blood Culture results may not be optimal due to an excessive volume of blood received in culture bottles Performed at [facility], [address], [place], [place] [address]    Culture  Setup Time   Final    GRAM POSITIVE COCCI ANAEROBIC BOTTLE ONLY CRITICAL RESULT CALLED TO, READ BACK  BY AND VERIFIED WITH: ABBY ELLINGTON @1022  01/04/19 AKT    Culture (A)  Final    STAPHYLOCOCCUS SPECIES (COAGULASE NEGATIVE) THE SIGNIFICANCE OF ISOLATING THIS ORGANISM FROM A SINGLE SET OF BLOOD CULTURES WHEN MULTIPLE SETS ARE DRAWN IS UNCERTAIN. PLEASE NOTIFY THE MICROBIOLOGY DEPARTMENT WITHIN ONE WEEK IF SPECIATION AND SENSITIVITIES ARE REQUIRED. Performed at [facility], [place] [address] [place], [place] [address]    Report Status PENDING  Incomplete  Blood culture (routine x 2)     Status: None (Preliminary result)   Collection Time: 01/03/19  1:05 PM   Specimen: BLOOD  Result Value Ref Range Status   Specimen Description BLOOD L WRIST  Final   Special Requests   Final    BOTTLES DRAWN AEROBIC AND ANAEROBIC Blood Culture results may not be optimal due to an excessive volume of blood received in culture bottles   Culture   Final    NO GROWTH 3 DAYS Performed at [facility], [address], [place],  [address]    Report Status PENDING  Incomplete  SARS Coronavirus 2 [facility] order, Performed in [facility] hospital lab) Nasopharyngeal Nasopharyngeal Swab     Status: None   Collection Time: 01/03/19  1:05 PM   Specimen: Nasopharyngeal Swab  Result Value Ref Range Status   SARS Coronavirus 2 NEGATIVE NEGATIVE Final    Comment: (NOTE) If result is NEGATIVE SARS-CoV-2 target nucleic acids are NOT DETECTED. The SARS-CoV-2 RNA is  generally detectable in upper and lower  respiratory specimens during the acute phase of infection. The lowest  concentration of SARS-CoV-2 viral copies this assay can detect is 250  copies / mL. A negative result does not preclude SARS-CoV-2 infection  and should not be used as the sole basis for treatment or other  patient management decisions.  A negative result may occur with  improper specimen collection / handling, submission of specimen other  than nasopharyngeal swab, presence of viral mutation(s) within the  areas targeted by this assay, and inadequate number of viral copies  (<250 copies / mL). A negative result must be combined with clinical  observations, patient history, and epidemiological information. If result is POSITIVE SARS-CoV-2 target nucleic acids are DETECTED. The SARS-CoV-2 RNA is generally detectable in upper and lower  respiratory specimens dur ing the acute phase of infection.  Positive  results are indicative of active infection with SARS-CoV-2.  Clinical  correlation with patient history and other diagnostic information is  necessary to determine patient infection status.  Positive results do  not rule out bacterial infection or co-infection with other viruses. If result is PRESUMPTIVE POSTIVE SARS-CoV-2 nucleic acids MAY BE PRESENT.   A presumptive positive result was obtained on the submitted specimen  and confirmed on repeat testing.  While 2019 novel coronavirus  (SARS-CoV-2) nucleic acids may be present in the submitted sample  additional confirmatory testing may be necessary for epidemiological  and / or clinical management purposes  to differentiate between  SARS-CoV-2 and other Sarbecovirus currently known to infect humans.  If clinically indicated additional testing with an alternate test  methodology [phone]) is advised. The SARS-CoV-2 RNA is generally  detectable in upper and lower respiratory sp ecimens during the acute  phase of  infection. The expected result is Negative. Fact Sheet for Patients:  StrictlyIdeas.no Fact Sheet for Healthcare Providers: BankingDealers.co.za This test is not yet approved or cleared by the [place] FDA and has been authorized for detection and/or diagnosis of SARS-CoV-2 by  FDA under an Emergency Use Authorization (EUA).  This EUA will remain in effect (meaning this test can be used) for the duration of the COVID-19 declaration under Section 564(b)(1) of the Act, 21 U.S.C. section 360bbb-3(b)(1), unless the authorization is terminated or revoked sooner. Performed at [facility], [address] [place], [place] [address]   Blood Culture ID Panel (Reflexed)     Status: Abnormal   Collection Time: 01/03/19  1:05 PM  Result Value Ref Range Status   Enterococcus species NOT DETECTED NOT DETECTED Final   Listeria monocytogenes NOT DETECTED NOT DETECTED Final   Staphylococcus species DETECTED (A) NOT DETECTED Final    Comment: Methicillin (oxacillin) susceptible coagulase negative staphylococcus. Possible blood culture contaminant (unless isolated from more than one blood culture draw or clinical case suggests pathogenicity). No antibiotic treatment is indicated for blood  culture contaminants. CRITICAL RESULT CALLED TO, READ BACK BY AND VERIFIED WITH: ABBY ELLINGTON @1022  01/04/19 AKT    Staphylococcus aureus (BCID) NOT DETECTED NOT DETECTED Final   Methicillin resistance NOT DETECTED NOT DETECTED Final   Streptococcus species NOT DETECTED NOT DETECTED Final   Streptococcus agalactiae NOT DETECTED NOT DETECTED Final   Streptococcus pneumoniae NOT DETECTED NOT DETECTED Final   Streptococcus pyogenes NOT DETECTED NOT DETECTED Final   Acinetobacter baumannii NOT DETECTED NOT DETECTED Final   Enterobacteriaceae species NOT DETECTED NOT DETECTED Final   Enterobacter cloacae complex NOT DETECTED NOT DETECTED Final   Escherichia  coli NOT DETECTED NOT DETECTED Final   Klebsiella oxytoca NOT DETECTED NOT DETECTED Final   Klebsiella pneumoniae NOT DETECTED NOT DETECTED Final   Proteus species NOT DETECTED NOT DETECTED Final   Serratia marcescens NOT DETECTED NOT DETECTED Final   Haemophilus influenzae NOT DETECTED NOT DETECTED Final   Neisseria meningitidis NOT DETECTED NOT DETECTED Final   Pseudomonas aeruginosa NOT DETECTED NOT DETECTED Final   Candida albicans NOT DETECTED NOT DETECTED Final   Candida glabrata NOT DETECTED NOT DETECTED Final   Candida krusei NOT DETECTED NOT DETECTED Final   Candida parapsilosis NOT DETECTED NOT DETECTED Final   Candida tropicalis NOT DETECTED NOT DETECTED Final    Comment: Performed at [facility], [address] [place], [place] [address]  MRSA PCR Screening     Status: None   Collection Time: 01/04/19  8:25 AM   Specimen: Nasal Mucosa; Nasopharyngeal  Result Value Ref Range Status   MRSA by PCR NEGATIVE NEGATIVE Final    Comment:        The GeneXpert MRSA Assay (FDA approved for NASAL specimens only), is one component of a comprehensive MRSA colonization surveillance program. It is not intended to diagnose MRSA infection nor to guide or monitor treatment for MRSA infections. Performed at [facility], [address] [place], [place] [address]     RADIOLOGY:  Ct Chest Wo Contrast  Result Date: 01/04/2019 CLINICAL DATA:  PT states here to follow up pneumonia. Pt states he has been on antibiotics.Pneumonia, unresolved or complicated h/o lung nodules EXAM: CT CHEST WITHOUT CONTRAST TECHNIQUE: Multidetector CT imaging of the chest was performed following the standard protocol without IV contrast. COMPARISON:  Chest radiograph 01/03/2019, CT 05/29/2016 FINDINGS: Cardiovascular: Coronary artery calcification and aortic atherosclerotic calcification. Mediastinum/Nodes: No axillary supraclavicular adenopathy. Borderline enlarged RIGHT lower paratracheal  node measures 11 mm. RIGHT hilar node measures 10 mm. Subluxed normal.  No pericardial effusion Lungs/Pleura: Dense consolidation in the RIGHT upper lobe with air bronchograms. There is peripheral ground-glass airspace disease surrounding the  dense consolidation. Similar findings in the RIGHT lower lobe with peribronchial patchy airspace consolidations. Mild consolidation at the LEFT lung base. No pleural fluid.  No pneumothorax. Upper Abdomen: Limited view of the liver, kidneys, pancreas are unremarkable. Normal adrenal glands. Musculoskeletal: No aggressive osseous lesion. IMPRESSION: 1. Multifocal bronchopneumonia most dense in the RIGHT upper lobe but also widespread in the RIGHT lower lobe. Mild involvement of the LEFT lower lobe. 2. Mild mediastinal adenopathy is favored reactive. Electronically Signed   By: Suzy Bouchard M.D.   On: 01/04/2019 15:32     CODE STATUS:     Code Status Orders  (From admission, onward)         Start     Ordered   01/03/19 1538  Do not attempt resuscitation (DNR)  Continuous    Question Answer Comment  In the event of cardiac or respiratory ARREST Do not call a "code blue"   In the event of cardiac or respiratory ARREST Do not perform Intubation, CPR, defibrillation or ACLS   In the event of cardiac or respiratory ARREST Use medication by any route, position, wound care, and other measures to relive pain and suffering. May use oxygen, suction and manual treatment of airway obstruction as needed for comfort.      01/03/19 1542        Code Status History    Date Active Date Inactive Code Status Order ID Comments User Context   08/01/2016 1601 08/03/2016 1611 DNR [id number]  Hillary Bow, MD ED   08/01/2016 1548 08/01/2016 1645 Full Code [id number]  Hillary Bow, MD ED   07/09/2015 1610 07/11/2015 1840 Full Code [id number]  Gladstone Lighter, MD ED   05/13/2015 0050 05/14/2015 1720 Full Code [id number]  Lance Coon, MD ED   Advance Care Planning Activity     Advance Directive Documentation     Most Recent Value  Type of Advance Directive  Out of facility DNR (pink MOST or yellow form)  Pre-existing out of facility DNR order (yellow form or pink MOST form)  -  "MOST" Form in Place?  -      TOTAL TIME TAKING CARE OF THIS PATIENT: **40* minutes.    Fritzi Mandes M.D on 01/06/2019 at 9:07 AM  Between 7am to 6pm - Pager - [phone] After 6pm go to [email or web address] - password [email or web address] [facility]  Office  [phone]  CC: Primary care physician; Juluis Pitch, MD

## 2019-01-06 NOTE — TOC Transition Note (Signed)
Transition of Care [facility]) - CM/SW Discharge Note   Patient Details  Name: Ernest Mclean MRN: [id number] Date of Birth: 09-23-1940  [facility] CM/SW Contact:  Katalaya Beel, Lenice Llamas Phone Number: [phone]  01/06/2019, 11:55 AM   Clinical Narrative: Clinical Social Worker (CSW) notified [place] representative that patient will D/C home today. RN aware of above. Please reconsult if future social work needs arise. CSW signing off.      Final next level of care: [place] Barriers to Discharge: Barriers Resolved   Patient Goals and CMS Choice Patient states their goals for this hospitalization and ongoing recovery are:: To go home. CMS Medicare.gov Compare Post Acute Care list provided to:: Patient Choice offered to / list presented to : Patient  Discharge Placement                       Discharge Plan and Services   Discharge Planning Services: CM Consult Post Acute Care Choice: Home Health          DME Arranged: N/A         HH Arranged: RN, PT [place] Agency: [place] ([place]) Date HH Agency Contacted: 01/06/19 Time [place]: 0930 Representative spoke with at [place]: [place] ([place]) Interventions     Readmission Risk Interventions No flowsheet data found.

## 2019-01-06 NOTE — Progress Notes (Signed)
Patient discharged home with home health. All discharge instructions given and all questions answered,

## 2019-01-08 ENCOUNTER — Telehealth: Payer: Self-pay | Admitting: Internal Medicine

## 2019-01-08 LAB — CULTURE, BLOOD (ROUTINE X 2): Culture: NO GROWTH

## 2019-01-08 NOTE — Telephone Encounter (Signed)
Called and spoke to pt.  Pt stated that he had a recent admission and was advised to stop wixela. Pt was not given a replacement inhaler, however he was given amoxicillin and prednisone. Pt is concerned about stopping wixela.  DK please advise. Thanks

## 2019-01-09 NOTE — Telephone Encounter (Signed)
Pt is aware of below recommendations and voiced his understanding.  Nothing further is needed.  

## 2019-01-09 NOTE — Telephone Encounter (Signed)
Please continue wixela

## 2019-01-20 ENCOUNTER — Telehealth: Payer: Self-pay | Admitting: Internal Medicine

## 2019-01-20 NOTE — Telephone Encounter (Signed)
Called and spoke to pt, who is requesting an appointment.  Pt discharged on 01/06/2019 for PNA. Home health nurse listened to him yesterday and heard rattling in upper right lung.  Per pt his PCP recommended that pt be seen by pulmonary.  Pt had had a persistent non prod cough.  Pt has been scheduled for acute visit with DR on 01/21/2019. Nothing further is needed.   Called patient for COVID-19 pre-screening for in office visit.  Have you recently traveled any where out of the local area in the last 2 weeks? no  Have you been in close contact with a person diagnosed with COVID-19 or someone awaiting results within the last 2 weeks? no  Do you currently have any of the following symptoms? If so, when did they start? Cough-yes      Diarrhea   Joint Pain Fever      Muscle Pain   Red eyes Shortness of breath   Abdominal pain  Vomiting Loss of smell    Rash    Sore Throat Headache    Weakness   Bruising or bleeding   Okay to proceed with visit. (date)  / Needs to reschedule visit. (date)

## 2019-01-21 ENCOUNTER — Other Ambulatory Visit: Payer: Self-pay

## 2019-01-21 ENCOUNTER — Ambulatory Visit: Payer: Medicare Other | Admitting: Internal Medicine

## 2019-01-21 ENCOUNTER — Encounter: Payer: Self-pay | Admitting: Internal Medicine

## 2019-01-21 VITALS — BP 126/78 | HR 65 | Temp 97.3°F | Ht 72.0 in | Wt 221.0 lb

## 2019-01-21 DIAGNOSIS — J181 Lobar pneumonia, unspecified organism: Secondary | ICD-10-CM | POA: Diagnosis not present

## 2019-01-21 NOTE — Patient Instructions (Signed)
Your lungs appear to be doing well.  Call us back if your have further breathing issues.  Continue to use your inhalers.

## 2019-01-21 NOTE — Progress Notes (Signed)
[facility] Consultation      MRN# [id number] Ernest Mclean August 12, 1940   brief History: Ernest Mclean is a 78 y.o. male with COPD, PVD with peripheral claudication, OSA  worst after PNA in 07/2014, now following for COPD and Chronic Hypoxic respiratory failure on 2L o2 (maily for PVD and at night for OSA). Pulmonary nodules on CT, sub cm, mainly due to inflammatory/infectious process.     CC  Follow-up COPD Follow-up sleep apnea   HPI The patient to normally sees Dr. [name], he has a history of oxygen dependent COPD.  He has completed pulmonary rehab.  His medication regimen is Wixella, Spiriva and albuterol as needed. He was recently admitted to the hospital in august of 2020 for multifocal pneumonia which was worse in the right lung.  He has had a visiting nurse checking up on him, the Rn noted that his lungs sounded worse. He notes that his breathing is feeling well. He check his oxygen at home and notes that his oxygen is usually in the mid-90s and never lower since getting out of the hospital.  He is using spiriva once daily, wixela twice daily and rinses mouth and albuterol MDI 2-3 times per week.   He is using cpap nightly with oxygen at 2L with PAP. He feels that he is doing well with it, he is more awake since using it. He is using it about 6+ hours per night.    CT chest and chest x-ray images from his recent hospitalization were personally reviewed.  Patient had a fairly extensive right upper lobe pneumonia, as well as pneumonic changes in the right middle and right lower lobes, in addition he also had some mild changes in the left lower lobe.  Medication:   Current Outpatient Medications:  .  albuterol (PROVENTIL HFA;VENTOLIN HFA) 108 (90 Base) MCG/ACT inhaler, Inhale 2 puffs into the lungs every 4 (four) hours as needed for wheezing or shortness of breath., Disp: 25.5 Inhaler, Rfl: 1 .  amLODipine (NORVASC) 5 MG tablet, Take 5 mg by mouth daily. , Disp: ,  Rfl:  .  ammonium lactate (AMLACTIN) 12 % cream, Apply 1 g topically as needed for dry skin., Disp: , Rfl:  .  amoxicillin-clavulanate (AUGMENTIN) 875-125 MG tablet, Take 1 tablet by mouth every 12 (twelve) hours., Disp: 14 tablet, Rfl: 0 .  apixaban (ELIQUIS) 5 MG TABS tablet, Take 2.5 mg by mouth 2 (two) times daily. 0.5 tablets b, Disp: , Rfl:  .  aspirin EC 81 MG tablet, Take 81 mg by mouth daily., Disp: , Rfl:  .  citalopram (CELEXA) 20 MG tablet, Take 20 mg by mouth at bedtime. , Disp: , Rfl: 5 .  CRANBERRY PO, Take 820 mg by mouth daily., Disp: , Rfl:  .  cyanocobalamin 500 MCG tablet, Take 500 mcg by mouth daily., Disp: , Rfl:  .  desonide (DESOWEN) 0.05 % cream, Apply 1 application topically daily as needed. , Disp: , Rfl:  .  diazepam (VALIUM) 5 MG tablet, Take 5 mg by mouth every 12 (twelve) hours as needed for anxiety. , Disp: , Rfl:  .  ergocalciferol (VITAMIN D2) 50000 units capsule, Take 50,000 Units by mouth every 30 (thirty) days., Disp: , Rfl:  .  furosemide (LASIX) 20 MG tablet, Take 20 mg by mouth daily. , Disp: , Rfl:  .  insulin glargine (LANTUS) 100 UNIT/ML injection, Inject 60 Units into the skin at bedtime. , Disp: , Rfl:  .  ketoconazole (NIZORAL) 2 % shampoo, Apply 1 application topically 2 (two) times a week., Disp: , Rfl:  .  lisinopril (PRINIVIL,ZESTRIL) 5 MG tablet, Take 5 mg by mouth daily., Disp: , Rfl:  .  metoprolol (LOPRESSOR) 100 MG tablet, Take 100 mg by mouth 2 (two) times daily., Disp: , Rfl: 0 .  OMEGA-3 FATTY ACIDS-VITAMIN E PO, Take 1 capsule by mouth daily., Disp: , Rfl:  .  omeprazole (PRILOSEC) 20 MG capsule, Take 20 mg by mouth daily., Disp: , Rfl:  .  pravastatin (PRAVACHOL) 20 MG tablet, Take 20 mg by mouth at bedtime., Disp: , Rfl: 3 .  predniSONE (DELTASONE) 10 MG tablet, Take 3 tablets (30 mg total) by mouth daily with breakfast. Taper by 10 mg daily then stop, Disp: 6 tablet, Rfl: 0 .  pregabalin (LYRICA) 25 MG capsule, Take 25 mg by mouth 2  (two) times daily., Disp: , Rfl:  .  tiotropium (SPIRIVA) 18 MCG inhalation capsule, Place 18 mcg into inhaler and inhale daily., Disp: , Rfl:  .  vitamin C (ASCORBIC ACID) 500 MG tablet, Take 500 mg by mouth daily., Disp: , Rfl:     Review of Systems  Constitutional: Negative for weight loss.  HENT: Negative for hearing loss.   Eyes: Negative for double vision.  Respiratory: Negative for cough, sputum production, shortness of breath and wheezing.        Improving SOB, now mostly with exertion  Cardiovascular: Negative for chest pain, palpitations, orthopnea and claudication.  Gastrointestinal: Negative for heartburn and nausea.  Genitourinary: Negative for dysuria.  Musculoskeletal: Positive for myalgias.       Chronic leg pain with exercise or exertion, has PVD/PAD  Skin: Negative for rash.  Neurological: Negative for headaches.    Allergies:  Codeine, Hydralazine, and Penicillins  Physical Examination:   VS: BP 126/78 (BP Location: Left Arm, Cuff Size: Normal)   Pulse 65   Temp (!) 97.3 F (36.3 C) (Temporal)   Ht 6' (1.829 m)   Wt 221 lb (100.2 kg)   SpO2 94%   BMI 29.97 kg/m   General Appearance: No distress  Neuro:without focal findings, mental status, speech normal, alert and oriented HEENT: PERRLA, EOM intact Pulmonary: No wheezing, No rales  CardiovascularNormal S1,S2.  No m/r/g.  Abdomen: Benign, Soft, non-tender, No masses Renal:  No costovertebral tenderness  GU:  No performed at this time. Endoc: No evident thyromegaly, no signs of acromegaly or Cushing features Skin:   warm, no rashes, no ecchymosis  Extremities: normal, no cyanosis, clubbing.   6MWT - 02/10/15 - distance: 260m - lowest saturation: 97% - no complaints during walk, no need for supplemental O2 with exertion  6MWT 09/20/2015 - distance 1126ft - lowest sat  95% - History of peripheral vascular disease and claudication, completed 8.5 minutes of 10 minutes of testing, stopped early due to  leg pain  PFTs 02/10/15 postBD FEV1 75% postBD FVC 70% FEV1/FVC 77% RV 147 TLC 126 RV/TLC 110 DLCO - unable toperform Impression - decrease FEV1, preserved FEV1/FVC ratio. Mild/Mod obstruction on inhalers, air trapping/hyperinflation noted.  Significant response to BD, >242ml on FVC and FEV1 post BD   EADIOLOGY: (The following images and results were reviewed by Dr. Stevenson Clinch on 01/21/2019). CT Chest 02/15/16 IMPRESSION:CT chest 01/2016 1. Several new pulmonary nodules since the prior chest CT in June 2016. 2. Patchy area of tree-in-bud appearance in the right upper lobe is likely inflammation or atypical infection. 3. Probable area of rounded atelectasis in the left  lower lobe. 4. Stable emphysematous changes. 5. Advanced atherosclerotic calcifications involving the thoracic and abdominal aorta and branch vessels. 6. Scattered mediastinal and hilar lymph nodes. 7. I do not think PET-CT would be helpful as these nodules are too small. Abdominal/pelvic CT may be helpful to exclude malignancy. If this is negative a three-month follow-up chest CT is suggested.   Repeat CT chest 05/2016 IMPRESSION: 1. Scattered small pulmonary nodules, regarded as new on the prior exam, are smaller or resolved on the current exam. 2. Resolved subpleural consolidation/ground-glass in the left lower lobe with residual scarring. 3. Additional pulmonary nodules are unchanged from 07/15/2014 and considered benign. 4. Aortic atherosclerosis (ICD10-170.0). Coronary artery calcification. 5. Cholelithiasis.  01/2015 PFTs  - preserved FEV1/FVC ratio, airtrapping and hyperinflation, significant response to BD.  6MWt  - 216m, no desats <88%, no need for supplemental O2.   09/2015 4mwt - 357m, lowest sat 95%, completed 8.5 of 33min, stopped early due to leg pain  02/02/2016: 78mwt - 462m/1378ft, lowest sat 97%, highest HR 80. Again on supplemental O2 for PVD with Claudication (given by Cards and Vasc Surg) and  OSA with hypoxemia at night.   Assessment and Plan:   78 year old pleasant white male seen today for follow-up mild to moderate COPD Gold stage B in the setting of CKD CHF and obstructive sleep apnea with chronic hypoxic respiratory failure in the setting of deconditioned state with a history of pulmonary nodules  Multifocal pneumonia with recent hospitalization. Chronic hypoxic respiratory failure with severe COPD. Obstructive sleep apnea. - Patient appears to be recovering well.  Asked to continue Wixella and Spiriva, as well as albuterol MDI as needed. - I will check a chest x-ray in 6 to 8 weeks to ensure resolution of his pneumonic changes, patient is asked to follow-up with Dr. [name] around that time. - Doing well with CPAP currently, continue use of CPAP every night.  Chronic shortness of breath and dyspnea exertion related to multifactorial etiologies including COPD CHF and deconditioned state  COPD No acute exacerbation at this time No signs of infection at this time Continue Advair as prescribed Continue Spiriva as prescribed Albuterol as needed Patient advised to use albuterol 2 minutes prior to exertional activities   Chronic hypoxic respiratory failure from COPD Patient uses and benefits from oxygen therapy Needs this to survive   History of pulmonary nodules Previous CT scan shows resolution of nodules No further CT scans needed at this time   OSA on CPAP Currently on CPAP 10 cm of water pressure Continue oxygen as prescribed I will need compliance report Patient using and benefiting from CPAP therapy  Deconditioned state with chronic shortness of breath with exertion with COPD Continue incentive spirometry as prescribed 25  times per day Recommend pulmonary rehab referral when open Increased daily activity and exercise as tolerated  COVID-19 EDUCATION: The signs and symptoms of COVID-19 were discussed with the patient and how to seek care for testing.  The  importance of social distancing was discussed today. Hand Washing Techniques and avoid touching face was advised.  MEDICATION ADJUSTMENTS/LABS AND TESTS ORDERED: Continue inhalers as prescribed Continue CPAP as prescribed Follow up with cardiology  CURRENT MEDICATIONS REVIEWED AT LENGTH WITH PATIENT TODAY   Deep Ashby Dawes, M.D., F.C.C.P.  Board Certified in Internal Medicine, Pulmonary Medicine, [place], and Sleep Medicine.  [facility] Office Number: [phone]

## 2019-02-10 ENCOUNTER — Telehealth: Payer: Self-pay

## 2019-02-10 NOTE — Telephone Encounter (Signed)
Pt has been scheduled for OV on 03/24/2019. Nothing further is needed.

## 2019-03-24 ENCOUNTER — Ambulatory Visit: Payer: Medicare Other | Admitting: Internal Medicine

## 2019-03-26 ENCOUNTER — Ambulatory Visit
Admission: RE | Admit: 2019-03-26 | Discharge: 2019-03-26 | Disposition: A | Discharge status: Home / Self Care | Payer: Medicare Other | Source: Ambulatory Visit | Attending: Internal Medicine | Admitting: Internal Medicine

## 2019-03-26 ENCOUNTER — Other Ambulatory Visit: Payer: Self-pay

## 2019-03-26 ENCOUNTER — Encounter: Payer: Self-pay | Admitting: Internal Medicine

## 2019-03-26 ENCOUNTER — Ambulatory Visit: Payer: Medicare Other | Admitting: Internal Medicine

## 2019-03-26 VITALS — BP 156/74 | HR 59 | Temp 97.7°F | Ht 72.0 in | Wt 227.0 lb

## 2019-03-26 DIAGNOSIS — J441 Chronic obstructive pulmonary disease with (acute) exacerbation: Secondary | ICD-10-CM | POA: Diagnosis not present

## 2019-03-26 DIAGNOSIS — J181 Lobar pneumonia, unspecified organism: Secondary | ICD-10-CM

## 2019-03-26 DIAGNOSIS — G4733 Obstructive sleep apnea (adult) (pediatric): Secondary | ICD-10-CM | POA: Diagnosis not present

## 2019-03-26 NOTE — Patient Instructions (Addendum)
Obtain CXR  Continue inhalers as prescribed Continue oxygen as prescribed at night Continue CPAP as prescribed

## 2019-03-26 NOTE — Progress Notes (Signed)
[facility] Consultation      MRN# [id number] Ernest Mclean [birth year]  No next brief History: 78 yo male with COPD, PVD with peripheral claudication, OSA  worst after PNA in 07/2014, now following for COPD and Chronic Hypoxic respiratory failure on 2L o2 (maily for PVD and at night for OSA). Pulmonary nodules on CT, sub cm, mainly due to inflammatory/infectious process.     CC  Follow up COPD   HPI Patient with recent admission to the hospital for COPD exacerbation and multilobar pneumonia August 2020 Chest x-ray shows bilateral opacifications right greater than left  At this time, No COPD exacerbation at this time No evidence of heart failure at this time No evidence or signs of infection at this time No respiratory distress No fevers, chills, nausea, vomiting, diarrhea No evidence of lower extremity edema No evidence hemoptysis  Currently on Advair and Spiriva Butyryl as needed  History of chronic respiratory failure on oxygen therapy Patient uses and benefits from oxygen therapy  Patient currently on Advair 500 And Spiriva Patient uses albuterol as needed and prior to exertion  Patient use incentive spirometry 25 times per day  Compliance report reviewed with patient Patient uses CPAP 10 cm water pressure Excellent compliance report 100% for days and  90% for >4 hours Patient AHI is down to 11  Patient is doing extremely well with his CPAP Patient uses a benefits from CPAP therapy    Medication:   Current Outpatient Medications:  .  albuterol (PROVENTIL HFA;VENTOLIN HFA) 108 (90 Base) MCG/ACT inhaler, Inhale 2 puffs into the lungs every 4 (four) hours as needed for wheezing or shortness of breath., Disp: 25.5 Inhaler, Rfl: 1 .  amLODipine (NORVASC) 5 MG tablet, Take 5 mg by mouth daily. , Disp: , Rfl:  .  ammonium lactate (AMLACTIN) 12 % cream, Apply 1 g topically as needed for dry skin., Disp: , Rfl:  .  apixaban (ELIQUIS) 5 MG TABS  tablet, Take 2.5 mg by mouth 2 (two) times daily. 0.5 tablets b, Disp: , Rfl:  .  aspirin EC 81 MG tablet, Take 81 mg by mouth daily., Disp: , Rfl:  .  citalopram (CELEXA) 20 MG tablet, Take 20 mg by mouth at bedtime. , Disp: , Rfl: 5 .  CRANBERRY PO, Take 820 mg by mouth daily., Disp: , Rfl:  .  cyanocobalamin 500 MCG tablet, Take 500 mcg by mouth daily., Disp: , Rfl:  .  desonide (DESOWEN) 0.05 % cream, Apply 1 application topically daily as needed. , Disp: , Rfl:  .  diazepam (VALIUM) 5 MG tablet, Take 5 mg by mouth every 12 (twelve) hours as needed for anxiety. , Disp: , Rfl:  .  ergocalciferol (VITAMIN D2) 50000 units capsule, Take 50,000 Units by mouth every 30 (thirty) days., Disp: , Rfl:  .  furosemide (LASIX) 20 MG tablet, Take 20 mg by mouth daily. , Disp: , Rfl:  .  insulin glargine (LANTUS) 100 UNIT/ML injection, Inject 60 Units into the skin at bedtime. , Disp: , Rfl:  .  ketoconazole (NIZORAL) 2 % shampoo, Apply 1 application topically 2 (two) times a week., Disp: , Rfl:  .  lisinopril (PRINIVIL,ZESTRIL) 5 MG tablet, Take 5 mg by mouth daily., Disp: , Rfl:  .  metoprolol (LOPRESSOR) 100 MG tablet, Take 100 mg by mouth 2 (two) times daily., Disp: , Rfl: 0 .  OMEGA-3 FATTY ACIDS-VITAMIN E PO, Take 1 capsule by mouth daily., Disp: , Rfl:  .  omeprazole (PRILOSEC) 20 MG capsule, Take 20 mg by mouth daily., Disp: , Rfl:  .  pravastatin (PRAVACHOL) 20 MG tablet, Take 20 mg by mouth at bedtime., Disp: , Rfl: 3 .  pregabalin (LYRICA) 25 MG capsule, Take 25 mg by mouth 2 (two) times daily., Disp: , Rfl:  .  tiotropium (SPIRIVA) 18 MCG inhalation capsule, Place 18 mcg into inhaler and inhale daily., Disp: , Rfl:  .  vitamin C (ASCORBIC ACID) 500 MG tablet, Take 500 mg by mouth daily., Disp: , Rfl:  .  WIXELA INHUB 500-50 MCG/DOSE AEPB, USE 1 INHALATION PO BID, Disp: , Rfl:     Review of Systems  Constitutional: Negative for chills, fever and weight loss.  HENT: Negative for hearing loss.    Eyes: Negative for blurred vision.  Respiratory: Positive for shortness of breath. Negative for cough, sputum production and wheezing.        Improving SOB, now mostly with exertion  Cardiovascular: Positive for claudication. Negative for chest pain, palpitations and orthopnea.  Gastrointestinal: Negative for heartburn and nausea.  Genitourinary: Negative for dysuria.  Musculoskeletal: Positive for myalgias.       Chronic leg pain with exercise or exertion, has PVD/PAD  Skin: Negative for rash.  Neurological: Negative for headaches.     Pulse (!) 59   SpO2 100%  Pulse (!) 59   SpO2 100%   Allergies:  Codeine, Hydralazine, and Penicillins  Physical Examination:   GENERAL:NAD, no fevers, chills, no weakness no fatigue HEAD: Normocephalic, atraumatic.  EYES: PERLA, EOMI No scleral icterus.  MOUTH: Moist mucosal membrane.  EAR, NOSE, THROAT: Clear without exudates. No external lesions.  NECK: Supple. No thyromegaly.  No JVD.  PULMONARY: CTA B/L no wheezing, rhonchi, crackles CARDIOVASCULAR: S1 and S2. Regular rate and rhythm. No murmurs GASTROINTESTINAL: Soft, nontender, nondistended. Positive bowel sounds.  MUSCULOSKELETAL: No swelling, clubbing, or edema.  NEUROLOGIC: No gross focal neurological deficits. 5/5 strength all extremities SKIN: No ulceration, lesions, rashes, or cyanosis.  PSYCHIATRIC: Insight, judgment intact. -depression -anxiety ALL OTHER ROS ARE NEGATIVE            6MWT - 02/10/15 - distance: 235m - lowest saturation: 97% - no complaints during walk, no need for supplemental O2 with exertion  6MWT 09/20/2015 - distance 1128ft - lowest sat  95% - History of peripheral vascular disease and claudication, completed 8.5 minutes of 10 minutes of testing, stopped early due to leg pain  PFTs 02/10/15 postBD FEV1 75% postBD FVC 70% FEV1/FVC 77% RV 147 TLC 126 RV/TLC 110 DLCO - unable toperform Impression - decrease FEV1, preserved FEV1/FVC ratio.  Mild/Mod obstruction on inhalers, air trapping/hyperinflation noted.  Significant response to BD, >228ml on FVC and FEV1 post BD   EADIOLOGY: (The following images and results were reviewed by Dr. Stevenson Clinch on 03/26/2019). CT Chest 02/15/16 IMPRESSION:CT chest 01/2016 1. Several new pulmonary nodules since the prior chest CT in June 2016. 2. Patchy area of tree-in-bud appearance in the right upper lobe is likely inflammation or atypical infection. 3. Probable area of rounded atelectasis in the left lower lobe. 4. Stable emphysematous changes. 5. Advanced atherosclerotic calcifications involving the thoracic and abdominal aorta and branch vessels. 6. Scattered mediastinal and hilar lymph nodes. 7. I do not think PET-CT would be helpful as these nodules are too small. Abdominal/pelvic CT may be helpful to exclude malignancy. If this is negative a three-month follow-up chest CT is suggested.   Repeat CT chest 05/2016 IMPRESSION: 1. Scattered small pulmonary nodules, regarded  as new on the prior exam, are smaller or resolved on the current exam. 2. Resolved subpleural consolidation/ground-glass in the left lower lobe with residual scarring. 3. Additional pulmonary nodules are unchanged from 07/15/2014 and considered benign. 4. Aortic atherosclerosis (ICD10-170.0). Coronary artery calcification. 5. Cholelithiasis.  01/2015 PFTs  - preserved FEV1/FVC ratio, airtrapping and hyperinflation, significant response to BD.  6MWt  - 212m, no desats <88%, no need for supplemental O2.   09/2015 64mwt - 317m, lowest sat 95%, completed 8.5 of 47min, stopped early due to leg pain  02/02/2016: 53mwt - 438m/1378ft, lowest sat 97%, highest HR 80. Again on supplemental O2 for PVD with Claudication (given by Cards and Vasc Surg) and OSA with hypoxemia at night.     Assessment and Plan:   78 year old pleasant white male today for mild to moderate COPD Gold stage D with CKD history of CHF with obstructive  sleep apnea with chronic hypoxic respiratory failure in the setting of deconditioned state and has a history of pulmonary nodules Patient with recent admission for bilateral pneumonia COPD exacerbation  Chronic shortness of breath and dyspnea exertion is multifactorial related to his COPD deconditioned state and CHF  COPD Gold stage D No acute exacerbation at this time No indication for antibiotics or prednisone Continue inhaler therapy as prescribed with Advair and Spiriva Albuterol as needed  Chronic hypoxic respiratory failure from COPD Continue oxygen as prescribed at night Patient uses and benefits from oxygen therapy needs this for survival  Pulmonary nodules Consider CT chest and follow-up in the next 6 to 9 months  Abnormal chest x-ray History of pneumonia Bilateral infiltrates and opacities on 01/09/2019 Repeat chest x-ray pending   OSA on CPAP Continue CPAP at 10 With oxygen Compliance report is excellent at this time Patient using a benefit from CPAP therapy  Deconditioned state with chronic shortness of breath and dyspnea on exertion from COPD Continue senna spirometry as prescribed Consider pulmonary rehab referral  COVID-19 EDUCATION: The signs and symptoms of COVID-19 were discussed with the patient and how to seek care for testing.  The importance of social distancing was discussed today. Hand Washing Techniques and avoid touching face was advised.     MEDICATION ADJUSTMENTS/LABS AND TESTS ORDERED: Continue inhalers as prescribed Continue oxygen as prescribed Continue CPAP as prescribed Follow-up chest x-ray pending   CURRENT MEDICATIONS REVIEWED AT LENGTH WITH PATIENT TODAY   Patient satisfied with Plan of action and management. All questions answered  Follow up in 6 months   Yuvin Bussiere Patricia Pesa, M.D.  [facility]  Medical Director [place] Director [facility] Department

## 2019-04-16 ENCOUNTER — Other Ambulatory Visit: Payer: Self-pay | Admitting: Internal Medicine

## 2019-06-06 ENCOUNTER — Encounter: Payer: Self-pay | Admitting: Adult Health

## 2019-06-06 ENCOUNTER — Telehealth: Payer: Self-pay | Admitting: Adult Health

## 2019-06-06 ENCOUNTER — Ambulatory Visit
Admission: RE | Admit: 2019-06-06 | Discharge: 2019-06-06 | Disposition: A | Discharge status: Home / Self Care | Payer: Medicare PPO | Attending: Adult Health | Admitting: Adult Health

## 2019-06-06 ENCOUNTER — Other Ambulatory Visit: Payer: Self-pay

## 2019-06-06 ENCOUNTER — Ambulatory Visit (INDEPENDENT_AMBULATORY_CARE_PROVIDER_SITE_OTHER): Payer: Medicare PPO | Admitting: Adult Health

## 2019-06-06 ENCOUNTER — Ambulatory Visit
Admission: RE | Admit: 2019-06-06 | Discharge: 2019-06-06 | Disposition: A | Discharge status: Home / Self Care | Payer: Medicare PPO | Source: Ambulatory Visit | Attending: Adult Health | Admitting: Adult Health

## 2019-06-06 DIAGNOSIS — Z8701 Personal history of pneumonia (recurrent): Secondary | ICD-10-CM

## 2019-06-06 DIAGNOSIS — J449 Chronic obstructive pulmonary disease, unspecified: Secondary | ICD-10-CM

## 2019-06-06 DIAGNOSIS — R059 Cough, unspecified: Secondary | ICD-10-CM

## 2019-06-06 DIAGNOSIS — R05 Cough: Secondary | ICD-10-CM

## 2019-06-06 DIAGNOSIS — J9611 Chronic respiratory failure with hypoxia: Secondary | ICD-10-CM | POA: Diagnosis not present

## 2019-06-06 DIAGNOSIS — G4733 Obstructive sleep apnea (adult) (pediatric): Secondary | ICD-10-CM

## 2019-06-06 NOTE — Patient Instructions (Addendum)
Z-Pak take as directed Mucinex DM twice daily as needed for cough and congestion Continue on current inhalers Continue on CPAP at bedtime with oxygen Adjust CPAP pressure 10 to 15 cm H2O. CPAP download in 4 weeks Do not eat 2 to 3 hours before bedtime, be cautious while eating, chew and swallow well do not talk while eating, eat smaller more frequent meals Discussed with primary care doctor that lisinopril may be aggravating your cough Follow-up with Dr. [name] in 4 weeks and as needed Please contact office for sooner follow up if symptoms do not improve or worsen or seek emergency care

## 2019-06-06 NOTE — Telephone Encounter (Signed)
Spoke to TP verbally- suggest switching visit to phone visit due to new cough and order CXR prior to visit.  Pt is aware and voiced his understanding.  CXR has been ordered.  Nothing further is needed.

## 2019-06-06 NOTE — Addendum Note (Signed)
Addended by: Maryanna Shape A on: 06/06/2019 02:13 PM   Modules accepted: Orders

## 2019-06-06 NOTE — Progress Notes (Signed)
Virtual Visit via Telephone Note  I connected with Ernest Mclean on 06/06/19 at  1:30 PM EST by telephone and verified that I am speaking with the correct person using two identifiers.  Location: Patient: Home  Provider: Office    I discussed the limitations, risks, security and privacy concerns of performing an evaluation and management service by telephone and the availability of in person appointments. I also discussed with the patient that there may be a patient responsible charge related to this service. The patient expressed understanding and agreed to proceed.   History of Present Illness: 79 year old male former smoker followed for COPD, obstructive sleep apnea on CPAP, chronic hypoxic respiratory failure on 2 L of oxygen at bedtime with CPAP, pulmonary nodules on CT chest  Medical history significant for diabetes  Today's televisit is an acute office visit.  Patient has underlying COPD and is oxygen dependent at bedtime.  Patient says over the last 10 days he has had increased cough did have a couple episodes where he coughed up some green mucus but it is essentially nonproductive.  Has felt a little bit more short of breath.  He denies any associated fever, body aches, loss of taste or smell known sick contacts or hemoptysis. Patient is on ACE inhibitor for several years. Patient had recent severe right-sided pneumonia in August 2020 with chest x-ray March 26, 2019 showing resolution of right-sided pneumonia. Chest x-ray today shows stable interstitial densities along the lung bases consistent with scarring versus subsegmental atelectasis. Appetite is good with no nausea vomiting diarrhea. He remains on Wixela and Spiriva   Patient has underlying sleep apnea is on nocturnal CPAP.  Patient says he wears a CPAP every night never misses a night.  He says he feels rested with no significant daytime sleepiness and feels that he benefits from CPAP.  CPAP download shows elevated AHI at  21/hour.  Patient is on CPAP 10 cm H2O.  Received Covid vaccine this week  Patient Active Problem List   Diagnosis Date Noted  . Pneumonia 01/03/2019  . 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 Atherosclerosis of native arteries of extremity with intermittent claudication ([place]) 05/08/2017  . Atherosclerosis of native arteries of the extremities with ulceration ([place]) 03/20/2017  . CKD (chronic kidney disease) 03/20/2017  . CHF (congestive heart failure) ([place]) 08/01/2016  . Anxiety 02/22/2016  . Chronic respiratory failure ([place]) 08/10/2015  . Hypoglycemia 05/12/2015  . Type 2 diabetes mellitus ([place]) 05/12/2015  . HTN (hypertension) 05/12/2015  . CAD (coronary artery disease) 05/12/2015  . Atrial flutter ([place]) 05/12/2015  . GERD (gastroesophageal reflux disease) 05/12/2015  . Solitary pulmonary nodule 02/10/2015  . OSA on CPAP 08/17/2014  . COPD (chronic obstructive pulmonary disease) ([place]) 08/17/2014   Current Outpatient Medications on File Prior to Visit  Medication Sig Dispense Refill  . albuterol (PROVENTIL HFA;VENTOLIN HFA) 108 (90 Base) MCG/ACT inhaler Inhale 2 puffs into the lungs every 4 (four) hours as needed for wheezing or shortness of breath. 25.5 Inhaler 1  . amLODipine (NORVASC) 5 MG tablet Take 5 mg by mouth daily.     Marland Kitchen apixaban (ELIQUIS) 5 MG TABS tablet Take 2.5 mg by mouth 2 (two) times daily. 0.5 tablets b    . aspirin EC 81 MG tablet Take 81 mg by mouth daily.    . citalopram (CELEXA) 20 MG tablet Take 20 mg by mouth at bedtime.   5  . CRANBERRY PO Take 820 mg by mouth daily.    Marland Kitchen  cyanocobalamin 500 MCG tablet Take 500 mcg by mouth daily.    . diazepam (VALIUM) 5 MG tablet Take 5 mg by mouth every 12 (twelve) hours as needed for anxiety.     . ergocalciferol (VITAMIN D2) 50000 units capsule Take 50,000 Units by mouth every 30 (thirty) days.    . furosemide (LASIX) 20 MG tablet Take 20 mg by mouth daily.     . insulin glargine (LANTUS) 100 UNIT/ML injection  Inject 60 Units into the skin at bedtime.     Marland Kitchen lisinopril (PRINIVIL,ZESTRIL) 5 MG tablet Take 5 mg by mouth daily.    . metoprolol (LOPRESSOR) 100 MG tablet Take 100 mg by mouth 2 (two) times daily.  0  . OMEGA-3 FATTY ACIDS-VITAMIN E PO Take 1 capsule by mouth daily.    Marland Kitchen omeprazole (PRILOSEC) 20 MG capsule Take 20 mg by mouth daily.    . pravastatin (PRAVACHOL) 20 MG tablet Take 20 mg by mouth at bedtime.  3  . pregabalin (LYRICA) 25 MG capsule Take 25 mg by mouth 2 (two) times daily.    Marland Kitchen tiotropium (SPIRIVA) 18 MCG inhalation capsule Place 18 mcg into inhaler and inhale daily.    . vitamin C (ASCORBIC ACID) 500 MG tablet Take 500 mg by mouth daily.    Grant Ruts INHUB 500-50 MCG/DOSE AEPB USE 1 INHALATION BY MOUTH TWICE DAILY 180 each 2   No current facility-administered medications on file prior to visit.     Observations/Objective  CXR  06/06/2019 Stable interstitial densities are noted particularly in the lung bases, most consistent with scarring or possibly minimal subsegmental atelectasis.  PFTs 02/10/15 postBD FEV1 75% postBD FVC 70% FEV1/FVC 77% RV 147 TLC 126 RV/TLC 110 DLCO - unable toperform Impression - decrease FEV1, preserved FEV1/FVC ratio. Mild/Mod obstruction on inhalers, air trapping/hyperinflation noted.  Significant response to BD, >261ml on FVC and FEV1 post BD    6MWT - 02/10/15 - distance: 236m - lowest saturation: 97% - no complaints during walk, no need for supplemental O2 with exertion  6MWT 09/20/2015 - distance 1169ft - lowest sat  95% - History of peripheral vascular disease and claudication, completed 8.5 minutes of 10 minutes of testing, stopped early due to leg pain  EADIOLOGY: (The following images and results were reviewed by Dr. Stevenson Clinch on 03/26/2019). CT Chest 02/15/16 IMPRESSION:CT chest 01/2016 1. Several new pulmonary nodules since the prior chest CT in June 2016. 2. Patchy area of tree-in-bud appearance in the right upper lobe  is likely inflammation or atypical infection. 3. Probable area of rounded atelectasis in the left lower lobe. 4. Stable emphysematous changes. 5. Advanced atherosclerotic calcifications involving the thoracic and abdominal aorta and branch vessels. 6. Scattered mediastinal and hilar lymph nodes. 7. I do not think PET-CT would be helpful as these nodules are too small. Abdominal/pelvic CT may be helpful to exclude malignancy. If this is negative a three-month follow-up chest CT is suggested.   Repeat CT chest 05/2016 IMPRESSION: 1. Scattered small pulmonary nodules, regarded as new on the prior exam, are smaller or resolved on the current exam. 2. Resolved subpleural consolidation/ground-glass in the left lower lobe with residual scarring. 3. Additional pulmonary nodules are unchanged from 07/15/2014 and considered benign. 4. Aortic atherosclerosis (ICD10-170.0). Coronary artery calcification. 5. Cholelithiasis.  01/2015 PFTs  - preserved FEV1/FVC ratio, airtrapping and hyperinflation, significant response to BD.  6MWt  - 235m, no desats <88%, no need for supplemental O2.   09/2015 45mwt - 352m, lowest sat 95%, completed  8.5 of 42min, stopped early due to leg pain  02/02/2016: 52mwt - 483m/1378ft, lowest sat 97%, highest HR 80. Again on supplemental O2 for PVD with Claudication (given by Cards and Vasc Surg) and OSA with hypoxemia at night.  Assessment and Plan: COPD flare-chest x-ray without acute pneumonia. We will treat with short course of antibiotics.  Added mucolytic's. Continue his current regimen. Follow-up in 4 weeks.  Obstructive sleep apnea with excellent compliance.  His control is not as good unclear the etiology.  Will adjust CPAP pressure 10 to 15 cm H2O.  CPAP download on return in 4 weeks  Plan  Patient Instructions  Z-Pak take as directed Mucinex DM twice daily as needed for cough and congestion Continue on current inhalers Continue on CPAP at bedtime with  oxygen Adjust CPAP pressure 10 to 15 cm H2O. CPAP download in 4 weeks Do not eat 2 to 3 hours before bedtime, be cautious while eating, chew and swallow well do not talk while eating, eat smaller more frequent meals Discussed with primary care doctor that lisinopril may be aggravating your cough Follow-up with Dr. [name] in 4 weeks and as needed Please contact office for sooner follow up if symptoms do not improve or worsen or seek emergency care       Follow Up Instructions: Follow-up in 4 weeks and as needed Please contact office for sooner follow up if symptoms do not improve or worsen or seek emergency care ]'    I discussed the assessment and treatment plan with the patient. The patient was provided an opportunity to ask questions and all were answered. The patient agreed with the plan and demonstrated an understanding of the instructions.   The patient was advised to call back or seek an in-person evaluation if the symptoms worsen or if the condition fails to improve as anticipated.  I provided 25 minutes of non-face-to-face time during this encounter.   Rexene Edison, NP

## 2019-06-07 ENCOUNTER — Other Ambulatory Visit: Payer: Self-pay | Admitting: Pulmonary Disease

## 2019-06-07 MED ORDER — AZITHROMYCIN 250 MG PO TABS: ORAL_TABLET | ORAL | 0 refills | Status: AC

## 2019-06-07 NOTE — Progress Notes (Signed)
Called from answering service at 9:54 AM, patient called due to not having received his Azithromycin that was intended to be prescribed yesterday.  Reviewed notes from Rexene Edison, NP and will send prescription for azithromycin to his pharmacy.   Renold Don, MD [facility]

## 2019-06-16 ENCOUNTER — Telehealth: Payer: Self-pay | Admitting: Internal Medicine

## 2019-06-16 NOTE — Telephone Encounter (Signed)
Spoke to pt, who is requesting a disc with recent CXR imaging.  I have provided pt with radiology contact number. Nothing further is needed.

## 2019-07-01 ENCOUNTER — Encounter: Payer: Self-pay | Admitting: Internal Medicine

## 2019-07-01 ENCOUNTER — Other Ambulatory Visit: Payer: Self-pay

## 2019-07-01 ENCOUNTER — Ambulatory Visit: Payer: Medicare PPO | Admitting: Internal Medicine

## 2019-07-01 VITALS — BP 150/82 | HR 57 | Temp 97.5°F | Ht 72.0 in | Wt 228.4 lb

## 2019-07-01 DIAGNOSIS — J449 Chronic obstructive pulmonary disease, unspecified: Secondary | ICD-10-CM

## 2019-07-01 DIAGNOSIS — G4733 Obstructive sleep apnea (adult) (pediatric): Secondary | ICD-10-CM

## 2019-07-01 DIAGNOSIS — R918 Other nonspecific abnormal finding of lung field: Secondary | ICD-10-CM

## 2019-07-01 NOTE — Progress Notes (Signed)
[facility] Consultation      MRN# [id number] Ernest Mclean [birth year]  brief History: 79 yo male with COPD, PVD with peripheral claudication, OSA  worst after PNA in 07/2014, now following for COPD and Chronic Hypoxic respiratory failure on 2L o2 (maily for PVD and at night for OSA). Pulmonary nodules on CT, sub cm, mainly due to inflammatory/infectious process.     CC  Follow-up COPD Follow-up OSA   HPI Patient with recent COPD exacerbation a month ago  Patient had a recent admission to the hospital COPD exacerbation multilobar pneumonia August 2020 X-ray shows bilateral opacifications right greater than left  Patient with recent and recurrent COPD exacerbation a month ago Patient was given antibiotics and prednisone  Currently on Advair and Spiriva Albuterol as needed  History of chronic respiratory failure on oxygen therapy Patient uses and benefits from oxygen therapy  No of COPD exacerbation at this time No evidence of heart failure at this time No evidence or signs of infection at this time No respiratory distress No fevers, chills, nausea, vomiting, diarrhea No evidence of lower extremity edema No evidence hemoptysis  Patient use incentive spirometry 20-25 times per day   Regarding his OSA Patient had previous compliance reports of 100% Current compliance report    Patient is doing extremely well with the CPAP He benefits from CPAP therapy    Medication:   Current Outpatient Medications:  .  albuterol (PROVENTIL HFA;VENTOLIN HFA) 108 (90 Base) MCG/ACT inhaler, Inhale 2 puffs into the lungs every 4 (four) hours as needed for wheezing or shortness of breath., Disp: 25.5 Inhaler, Rfl: 1 .  amLODipine (NORVASC) 5 MG tablet, Take 5 mg by mouth daily. , Disp: , Rfl:  .  apixaban (ELIQUIS) 5 MG TABS tablet, Take 2.5 mg by mouth 2 (two) times daily. 0.5 tablets b, Disp: , Rfl:  .  aspirin EC 81 MG tablet, Take 81 mg by mouth daily., Disp: ,  Rfl:  .  citalopram (CELEXA) 20 MG tablet, Take 20 mg by mouth at bedtime. , Disp: , Rfl: 5 .  CRANBERRY PO, Take 820 mg by mouth daily., Disp: , Rfl:  .  cyanocobalamin 500 MCG tablet, Take 500 mcg by mouth daily., Disp: , Rfl:  .  diazepam (VALIUM) 5 MG tablet, Take 5 mg by mouth every 12 (twelve) hours as needed for anxiety. , Disp: , Rfl:  .  ergocalciferol (VITAMIN D2) 50000 units capsule, Take 50,000 Units by mouth every 30 (thirty) days., Disp: , Rfl:  .  furosemide (LASIX) 20 MG tablet, Take 20 mg by mouth daily. , Disp: , Rfl:  .  insulin glargine (LANTUS) 100 UNIT/ML injection, Inject 60 Units into the skin at bedtime. , Disp: , Rfl:  .  lisinopril (PRINIVIL,ZESTRIL) 5 MG tablet, Take 5 mg by mouth daily., Disp: , Rfl:  .  metoprolol (LOPRESSOR) 100 MG tablet, Take 100 mg by mouth 2 (two) times daily., Disp: , Rfl: 0 .  OMEGA-3 FATTY ACIDS-VITAMIN E PO, Take 1 capsule by mouth daily., Disp: , Rfl:  .  omeprazole (PRILOSEC) 20 MG capsule, Take 20 mg by mouth daily., Disp: , Rfl:  .  pravastatin (PRAVACHOL) 20 MG tablet, Take 20 mg by mouth at bedtime., Disp: , Rfl: 3 .  pregabalin (LYRICA) 25 MG capsule, Take 25 mg by mouth 2 (two) times daily., Disp: , Rfl:  .  tiotropium (SPIRIVA) 18 MCG inhalation capsule, Place 18 mcg into inhaler and inhale daily., Disp: ,  Rfl:  .  vitamin C (ASCORBIC ACID) 500 MG tablet, Take 500 mg by mouth daily., Disp: , Rfl:  .  WIXELA INHUB 500-50 MCG/DOSE AEPB, USE 1 INHALATION BY MOUTH TWICE DAILY, Disp: 180 each, Rfl: 2    Review of Systems  Constitutional: Negative for chills, fever and weight loss.  HENT: Negative for hearing loss.   Eyes: Negative for blurred vision.  Respiratory: Positive for shortness of breath. Negative for cough, sputum production and wheezing.        Improving SOB, now mostly with exertion  Cardiovascular: Positive for claudication. Negative for chest pain, palpitations and orthopnea.  Gastrointestinal: Negative for heartburn  and nausea.  Genitourinary: Negative for dysuria.  Musculoskeletal: Positive for myalgias.       Chronic leg pain with exercise or exertion, has PVD/PAD  Skin: Negative for rash.  Neurological: Negative for headaches.     There were no vitals taken for this visit. There were no vitals taken for this visit.  Allergies:  Codeine, Hydralazine, and Penicillins  Physical Examination:   General Appearance: No distress  Neuro:without focal findings,  speech normal,  HEENT: PERRLA, EOM intact.   Pulmonary: normal breath sounds, No wheezing.  CardiovascularNormal S1,S2.  No m/r/g.   Abdomen: Benign, Soft, non-tender. Renal:  No costovertebral tenderness  GU:  Not performed at this time. Endoc: No evident thyromegaly Skin:   warm, no rashes, no ecchymosis  Extremities: normal, no cyanosis, clubbing. PSYCHIATRIC: Mood, affect within normal limits.   ALL OTHER ROS ARE NEGATIVE      6MWT - 02/10/15 - distance: 243m - lowest saturation: 97% - no complaints during walk, no need for supplemental O2 with exertion  6MWT 09/20/2015 - distance 1129ft - lowest sat  95% - History of peripheral vascular disease and claudication, completed 8.5 minutes of 10 minutes of testing, stopped early due to leg pain  PFTs 02/10/15 postBD FEV1 75% postBD FVC 70% FEV1/FVC 77% RV 147 TLC 126 RV/TLC 110 DLCO - unable toperform Impression - decrease FEV1, preserved FEV1/FVC ratio. Mild/Mod obstruction on inhalers, air trapping/hyperinflation noted.  Significant response to BD, >246ml on FVC and FEV1 post BD   EADIOLOGY: (The following images and results were reviewed by Dr. Stevenson Clinch on 07/01/2019). CT Chest 02/15/16 IMPRESSION:CT chest 01/2016 1. Several new pulmonary nodules since the prior chest CT in June 2016. 2. Patchy area of tree-in-bud appearance in the right upper lobe is likely inflammation or atypical infection. 3. Probable area of rounded atelectasis in the left lower lobe. 4. Stable  emphysematous changes. 5. Advanced atherosclerotic calcifications involving the thoracic and abdominal aorta and branch vessels. 6. Scattered mediastinal and hilar lymph nodes. 7. I do not think PET-CT would be helpful as these nodules are too small. Abdominal/pelvic CT may be helpful to exclude malignancy. If this is negative a three-month follow-up chest CT is suggested.   Repeat CT chest 05/2016 IMPRESSION: 1. Scattered small pulmonary nodules, regarded as new on the prior exam, are smaller or resolved on the current exam. 2. Resolved subpleural consolidation/ground-glass in the left lower lobe with residual scarring. 3. Additional pulmonary nodules are unchanged from 07/15/2014 and considered benign. 4. Aortic atherosclerosis (ICD10-170.0). Coronary artery calcification. 5. Cholelithiasis.  01/2015 PFTs  - preserved FEV1/FVC ratio, airtrapping and hyperinflation, significant response to BD.  6MWt  - 271m, no desats <88%, no need for supplemental O2.   09/2015 30mwt - 39m, lowest sat 95%, completed 8.5 of 66min, stopped early due to leg pain  02/02/2016: 68mwt -  448m/1378ft, lowest sat 97%, highest HR 80. Again on supplemental O2 for PVD with Claudication (given by Cards and Vasc Surg) and OSA with hypoxemia at night.     Assessment and Plan:     79 year old pleasant white male seen today for follow-up moderate COPD Gold stage D with CKD history of CHF with obstructive sleep apnea with chronic hypoxic respiratory failure in the setting of deconditioned state and with a history of pulmonary nodules with previous history of bilateral pneumonia with COPD exacerbation  Chronic shortness of breath and dyspnea exertion related to multifactorial issues most likely related to his COPD CHF and deconditioned state  Gold stage D COPD No indication for antibiotics or prednisone at this time Continue inhaler therapy as prescribed Patient takes Advair and Spiriva Albuterol as  needed  Chronic hypoxic respiratory failure from COPD Continue oxygen therapy as prescribed at night Patient uses and benefits from oxygen therapy needs this for survival   Pulmonary nodules We will obtain CT of the chest to assess for previous history of pulmonary nodules  Patient is at risk for lung cancer as he is a previous smoker    Previous history of abnormal chest x-ray June 06, 2019 x-ray independently reviewed Stable bilateral interstitial lung disease   OSA on CPAP Continue CPAP at 10 Continue oxygen as prescribed Patient benefits from CPAP therapy    Deconditioned state with chronic shortness of breath and dyspnea exertion from COPD Continue incentive spirometry as prescribed    COVID-19 EDUCATION: The signs and symptoms of COVID-19 were discussed with the patient and how to seek care for testing.  The importance of social distancing was discussed today. Hand Washing Techniques and avoid touching face was advised.  MEDICATION ADJUSTMENTS/LABS AND TESTS ORDERED: Continue inhalers as prescribed Continue oxygen as prescribed Continue CPAP as prescribed Obtain CT of the chest to assess pulmonary nodules   CURRENT MEDICATIONS REVIEWED AT LENGTH WITH PATIENT TODAY   Patient satisfied with Plan of action and management. All questions answered  Follow up in 6 months   Shajuana Mclucas Patricia Pesa, M.D.  [facility]  Medical Director [place] Director [facility] Cardio-Pulmonary Department

## 2019-07-01 NOTE — Patient Instructions (Addendum)
Continue CPAP as prescribed Continue oxygen as prescribed Continue inhalers as prescribed 10 CT chest to assess for pulmonary nodules

## 2019-07-03 ENCOUNTER — Ambulatory Visit: Payer: Medicare PPO | Admitting: Internal Medicine

## 2019-07-08 ENCOUNTER — Telehealth: Payer: Self-pay | Admitting: Adult Health

## 2019-07-08 DIAGNOSIS — G4733 Obstructive sleep apnea (adult) (pediatric): Secondary | ICD-10-CM

## 2019-07-08 NOTE — Telephone Encounter (Signed)
Patient had televisit with TP in [place] on 1.15.21  Per that office visit: Assessment and Plan: COPD flare-chest x-ray without acute pneumonia. We will treat with short course of antibiotics.  Added mucolytic's. Continue his current regimen. Follow-up in 4 weeks.   Obstructive sleep apnea with excellent compliance.  His control is not as good unclear the etiology.  Will adjust CPAP pressure 10 to 15 cm H2O.  CPAP download on return in 4 weeks   Plan  Patient Instructions  Z-Pak take as directed Mucinex DM twice daily as needed for cough and congestion Continue on current inhalers Continue on CPAP at bedtime with oxygen Adjust CPAP pressure 10 to 15 cm H2O. CPAP download in 4 weeks Do not eat 2 to 3 hours before bedtime, be cautious while eating, chew and swallow well do not talk while eating, eat smaller more frequent meals Discussed with primary care doctor that lisinopril may be aggravating your cough Follow-up with Dr. [name] in 4 weeks and as needed Please contact office for sooner follow up if symptoms do not improve or worsen or seek emergency care     4 week download received from [facility] but patient's pressure setting is unchanged at 10cm.  This was verified through [employer].  Called Dimas Chyle w/ [employer] who reported that she was able to see the original order and that she sent said order to appropriate dept.  Sonia Baller will investigate and ensure pressure settings are adjusted.  Will place DME order again to ensure another DL is received in 4 weeks.  Nothing further needed at this time; will sign off.

## 2019-07-14 ENCOUNTER — Other Ambulatory Visit: Payer: Self-pay

## 2019-07-14 ENCOUNTER — Ambulatory Visit
Admission: RE | Admit: 2019-07-14 | Discharge: 2019-07-14 | Disposition: A | Discharge status: Home / Self Care | Payer: Medicare PPO | Source: Ambulatory Visit | Attending: Internal Medicine | Admitting: Internal Medicine

## 2019-07-14 DIAGNOSIS — R918 Other nonspecific abnormal finding of lung field: Secondary | ICD-10-CM | POA: Diagnosis present

## 2019-07-15 ENCOUNTER — Ambulatory Visit (INDEPENDENT_AMBULATORY_CARE_PROVIDER_SITE_OTHER): Payer: Medicare Other | Admitting: Vascular Surgery

## 2019-07-15 ENCOUNTER — Encounter (INDEPENDENT_AMBULATORY_CARE_PROVIDER_SITE_OTHER): Payer: Self-pay | Admitting: Vascular Surgery

## 2019-07-15 ENCOUNTER — Ambulatory Visit (INDEPENDENT_AMBULATORY_CARE_PROVIDER_SITE_OTHER): Payer: Medicare PPO

## 2019-07-15 VITALS — BP 149/61 | HR 62 | Resp 16 | Ht 72.0 in | Wt 224.0 lb

## 2019-07-15 DIAGNOSIS — Z794 Long term (current) use of insulin: Secondary | ICD-10-CM

## 2019-07-15 DIAGNOSIS — I10 Essential (primary) hypertension: Secondary | ICD-10-CM

## 2019-07-15 DIAGNOSIS — I70213 Atherosclerosis of native arteries of extremities with intermittent claudication, bilateral legs: Secondary | ICD-10-CM

## 2019-07-15 DIAGNOSIS — I5032 Chronic diastolic (congestive) heart failure: Secondary | ICD-10-CM

## 2019-07-15 DIAGNOSIS — E1122 Type 2 diabetes mellitus with diabetic chronic kidney disease: Secondary | ICD-10-CM | POA: Diagnosis not present

## 2019-07-15 DIAGNOSIS — I4892 Unspecified atrial flutter: Secondary | ICD-10-CM | POA: Diagnosis not present

## 2019-07-15 DIAGNOSIS — N183 Chronic kidney disease, stage 3 unspecified: Secondary | ICD-10-CM

## 2019-07-15 NOTE — Progress Notes (Signed)
MRN : [id number]  Ernest Mclean is a 79 y.o. (06-21-1940) male who presents with chief complaint of  Chief Complaint  Patient presents with  . Follow-up    ultrasound follow up   .  History of Present Illness: Patient returns today in follow up of his PAD and known aortoiliac occlusion.  This has been known for a couple of years but he has tolerated it reasonably well.  No rest pain or ulceration.  His claudication symptoms are stable without significant progression from his last visit a year ago. ABIs today are stable to slightly improved on the right and 0.73 and stable on the left at 1.01.   Current Outpatient Medications  Medication Sig Dispense Refill  . albuterol (PROVENTIL HFA;VENTOLIN HFA) 108 (90 Base) MCG/ACT inhaler Inhale 2 puffs into the lungs every 4 (four) hours as needed for wheezing or shortness of breath. 25.5 Inhaler 1  . amLODipine (NORVASC) 5 MG tablet Take 5 mg by mouth daily.     Marland Kitchen apixaban (ELIQUIS) 5 MG TABS tablet Take 2.5 mg by mouth 2 (two) times daily. 0.5 tablets b    . aspirin EC 81 MG tablet Take 81 mg by mouth daily.    . citalopram (CELEXA) 20 MG tablet Take 20 mg by mouth at bedtime.   5  . CRANBERRY PO Take 820 mg by mouth daily.    . cyanocobalamin 500 MCG tablet Take 500 mcg by mouth daily.    . diazepam (VALIUM) 5 MG tablet Take 5 mg by mouth every 12 (twelve) hours as needed for anxiety.     . ergocalciferol (VITAMIN D2) 50000 units capsule Take 50,000 Units by mouth every 30 (thirty) days.    . furosemide (LASIX) 20 MG tablet Take 20 mg by mouth daily.     Marland Kitchen HUMALOG 100 UNIT/ML injection     . insulin glargine (LANTUS) 100 UNIT/ML injection Inject 60 Units into the skin at bedtime.     Marland Kitchen lisinopril (PRINIVIL,ZESTRIL) 5 MG tablet Take 5 mg by mouth daily.    . metoprolol (LOPRESSOR) 100 MG tablet Take 100 mg by mouth 2 (two) times daily.  0  . OMEGA-3 FATTY ACIDS-VITAMIN E PO Take 1 capsule by mouth daily.    Marland Kitchen omeprazole (PRILOSEC) 20 MG  capsule Take 20 mg by mouth daily.    . pravastatin (PRAVACHOL) 20 MG tablet Take 20 mg by mouth at bedtime.  3  . pregabalin (LYRICA) 25 MG capsule Take 25 mg by mouth 2 (two) times daily.    Marland Kitchen tiotropium (SPIRIVA) 18 MCG inhalation capsule Place 18 mcg into inhaler and inhale daily.    . vitamin C (ASCORBIC ACID) 500 MG tablet Take 500 mg by mouth daily.    Grant Ruts INHUB 500-50 MCG/DOSE AEPB USE 1 INHALATION BY MOUTH TWICE DAILY 180 each 2   No current facility-administered medications for this visit.    Past Medical History:  Diagnosis Date  . Atrial fibrillation ([place])   . Basal cell carcinoma 06/2017   Left Ear  . CAD (coronary artery disease)   . CKD (chronic kidney disease)   . Congestive heart failure ([place])   . COPD (chronic obstructive pulmonary disease) ([place])   . Diabetes ([place])   . Hypertension   . OSA on CPAP   . 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 CLAVICLE SURGERY    . heart bypass    .  HERNIA REPAIR    . MOHS SURGERY  07/10/2017   Head     Social History   Tobacco Use  . Smoking status: Former Smoker    Packs/day: 1.00    Years: 30.00    Pack years: 30.00    Types: Cigarettes    Quit date: 12/14/2001    Years since quitting: 17.5  . Smokeless tobacco: Never Used  . Tobacco comment: quit 12/14/2001  Substance Use Topics  . Alcohol use: No    Alcohol/week: 0.0 standard drinks  . Drug use: No    Family History  Problem Relation Age of Onset  . Stroke Other   . Diabetes Other   . Breast cancer Other   . Colon cancer Other      Allergies  Allergen Reactions  . Codeine Hives, Rash and Swelling  . Hydralazine Other (See Comments)    tongue swollen and couldn't wake up ---- not positive it was this or a mix of this with something else or high sugar  . Penicillins Other (See Comments)    Passed out (at 79 yrs old) Has patient had a PCN reaction causing immediate rash,  facial/tongue/throat swelling, SOB or lightheadedness with hypotension: No Has patient had a PCN reaction causing severe rash involving mucus membranes or skin necrosis: No Has patient had a PCN reaction that required hospitalization No Has patient had a PCN reaction occurring within the last 10 years: No If all of the above answers are "NO", then may proceed with Cephalosporin use.    REVIEW OF SYSTEMS(Negative unless checked)  Constitutional: [] ??Weight loss[] ??Fever[] ??Chills Cardiac:[] ??Chest pain[] ??Chest pressure[x] ??Palpitations [] ??Shortness of breath when laying flat [] ??Shortness of breath at rest [x] ??Shortness of breath with exertion. Vascular: [x] ??Pain in legs with walking[] ??Pain in legsat rest[] ??Pain in legs when laying flat [x] ??Claudication [] ??Pain in feet when walking [] ??Pain in feet at rest [] ??Pain in feet when laying flat [] ??History of DVT [] ??Phlebitis [x] ??Swelling in legs [] ??Varicose veins [x] ??Non-healing ulcers Pulmonary: [x] ??Uses home oxygen [] ??Productive cough[] ??Hemoptysis [] ??Wheeze [x] ??COPD [] ??Asthma Neurologic: [] ??Dizziness [] ??Blackouts [] ??Seizures [] ??History of stroke [] ??History of TIA[] ??Aphasia [] ??Temporary blindness[] ??Dysphagia [] ??Weaknessor numbness in arms [x] ??Weakness or numbnessin legs Musculoskeletal: [x] ??Arthritis [] ??Joint swelling [] ??Joint pain [] ??Low back pain Hematologic:[] ??Easy bruising[] ??Easy bleeding [] ??Hypercoagulable state [] ??Anemic [] ??Hepatitis Gastrointestinal:[] ??Blood in stool[] ??Vomiting blood[] ??Gastroesophageal reflux/heartburn[] ??Abdominal pain Genitourinary: [x] ??Chronic kidney disease [] ??Difficulturination [] ??Frequenturination [] ??Burning with urination[] ??Hematuria Skin: [] ??Rashes [x] ??Ulcers [x] ??Wounds Psychological: [] ??History of anxiety[] ??History of major depression.  Physical  Examination  BP (!) 149/61 (BP Location: Left Arm)   Pulse 62   Resp 16   Ht 6' (1.829 m)   Wt 224 lb (101.6 kg)   BMI 30.38 kg/m  Gen:  WD/WN, NAD Head: [place]/AT, No temporalis wasting. Ear/Nose/Throat: Hearing grossly intact, nares w/o erythema or drainage Eyes: Conjunctiva clear. Sclera non-icteric Neck: Supple.  Trachea midline Pulmonary:  Good air movement, no use of accessory muscles.  Cardiac: RRR, no JVD Vascular:  Vessel Right Left  Radial Palpable Palpable                          PT  trace palpable  1+ palpable  DP  1+ palpable  1-2+ palpable    Musculoskeletal: M/S 5/5 throughout.  No deformity or atrophy.  Trace lower extremity edema. Neurologic: Sensation grossly intact in extremities.  Symmetrical.  Speech is fluent.  Psychiatric: Judgment intact, Mood & affect appropriate for pt's clinical situation. Dermatologic: No rashes or ulcers noted.  No cellulitis or open wounds.       Labs No results found  for this or any previous visit (from the past 2160 hour(s)).  Radiology CT CHEST WO CONTRAST  Result Date: 07/14/2019 CLINICAL DATA:  Recent pneumonia. Reported previous pulmonary nodule EXAM: CT CHEST WITHOUT CONTRAST TECHNIQUE: Multidetector CT imaging of the chest was performed following the standard protocol without IV contrast. COMPARISON:  Most recent chest radiograph June 06, 2019; most recent chest CT January 04, 2019 FINDINGS: Cardiovascular: There is no demonstrable thoracic aortic aneurysm. There is aortic atherosclerosis. There is calcification in the proximal visualized great vessels, most notably in the right innominate artery proximally. There are multiple foci of coronary artery calcification. There is no pericardial effusion or pericardial thickening. Mediastinum/Nodes: Thyroid appears unremarkable. There are scattered subcentimeter mediastinal lymph nodes. A lymph node in the precarinal region measures 1.0 x 1.0 cm, similar to prior study. A  lymph node in the right hilar region measures 1.1 x 1.0 cm, essentially stable as well. No there is a subcarinal lymph node measuring 1.0 x 0.9 cm, stable. No progression of lymph node prominence since prior study. Air is noted in the distal esophagus. Lungs/Pleura: There is underlying centrilobular type emphysematous change. There is a nodular opacity arising from the minor fissure on the left in the superior segment left lower lobe measuring 4 mm, seen on axial slice 37 series 3. This nodular opacity was not appreciable on most recent CT. There are scattered areas of atelectatic change, primarily in the lingula and right middle lobes. There is scarring in the right lower lobe with a small amount of ill-defined opacity in this area, much less than on previous study and potentially representing residual scarring. There is a degree of lower lobe bronchiectatic change bilaterally. No appreciable pleural effusions. Several 1-2 mm nodular opacities are noted in the bases posteriorly. On axial slice 80 series 3, there is a 4 x 3 mm nodular opacity in the lateral segment right lower lobe, stable from prior study. A second nodular opacity measuring 2 mm is noted slightly more posteriorly in this area, stable. There is a stable 4 mm nodular opacity in the posterior segment left upper lobe seen on axial slice 40 series 3. Upper Abdomen: There is an 8 x 7 mm benign left adrenal adenoma. There is aortic and proximal mesenteric arterial calcification at several sites. Other visualized upper abdominal structures appear unremarkable. Musculoskeletal: Patient is status post median sternotomy. No blastic or lytic bone lesions. No evident chest wall lesions. IMPRESSION: 1. Underlying centrilobular emphysematous change and lower lobe bronchiectatic change. Scattered areas of atelectasis and scarring. No frank edema or consolidation. There are several 2-4 mm nodular opacities as noted. No follow-up needed if patient is low-risk (and  has no known or suspected primary neoplasm). Non-contrast chest CT can be considered in 12 months if patient is high-risk. This recommendation follows the consensus statement: Guidelines for Management of Incidental Pulmonary Nodules Detected on CT Images: From the Fleischner Society 2017; Radiology 2017; 284:228-243. 2. Borderline prominent lymph nodes in the precarinal and right hilar regions, essentially stable. No progression of lymph node prominence. 3. No aneurysm. There is thoracic aortic atherosclerosis as well as great vessel and coronary artery calcification at multiple sites. 4.  Small benign left adrenal adenoma. Aortic Atherosclerosis (ICD10-I70.0) and Emphysema (ICD10-J43.9). Electronically Signed   By: Lowella Grip III M.D.   On: 07/14/2019 08:21    Assessment/Plan Atrial flutter (HCC) On anticoagulation  CHF (congestive heart failure) (HCC) Has chronic reduced ejection fraction of about 35-40% per the wife. Certainly would be high  risk for open surgical therapy.  HTN (hypertension) blood pressure control important in reducing the progression of atherosclerotic disease. On appropriate oral medications.   Type 2 diabetes mellitus (HCC) blood glucose control important in reducing the progression of atherosclerotic disease. Also, involved in wound healing. On appropriate medications.  Atherosclerosis of native arteries of extremity with intermittent claudication (HCC) ABIs today are stable to slightly improved on the right and 0.73 and stable on the left at 1.01.  Known aortoiliac occlusion, but only mild to moderate claudication symptoms at current.  He is okay continuing conservative therapy and since he does not have limb threatening symptoms I would certainly not recommend anything invasive at this point.  I will plan to follow him on an annual basis unless his symptoms deteriorate    Leotis Pain, MD  07/15/2019 2:38 PM    This note was created with Dragon medical  transcription system.  Any errors from dictation are purely unintentional

## 2019-07-15 NOTE — Assessment & Plan Note (Signed)
ABIs today are stable to slightly improved on the right and 0.73 and stable on the left at 1.01.  Known aortoiliac occlusion, but only mild to moderate claudication symptoms at current.  He is okay continuing conservative therapy and since he does not have limb threatening symptoms I would certainly not recommend anything invasive at this point.  I will plan to follow him on an annual basis unless his symptoms deteriorate

## 2019-09-27 ENCOUNTER — Other Ambulatory Visit: Payer: Self-pay

## 2019-09-27 ENCOUNTER — Encounter: Payer: Self-pay | Admitting: Emergency Medicine

## 2019-09-27 ENCOUNTER — Emergency Department: Payer: Medicare PPO

## 2019-09-27 ENCOUNTER — Inpatient Hospital Stay
Admission: EM | Admit: 2019-09-27 | Discharge: 2019-10-02 | DRG: 193 | Disposition: A | Discharge status: Home Health | Payer: Medicare PPO | Attending: Hospitalist | Admitting: Hospitalist

## 2019-09-27 DIAGNOSIS — I248 Other forms of acute ischemic heart disease: Secondary | ICD-10-CM | POA: Diagnosis present

## 2019-09-27 DIAGNOSIS — J441 Chronic obstructive pulmonary disease with (acute) exacerbation: Secondary | ICD-10-CM | POA: Diagnosis present

## 2019-09-27 DIAGNOSIS — K219 Gastro-esophageal reflux disease without esophagitis: Secondary | ICD-10-CM | POA: Diagnosis present

## 2019-09-27 DIAGNOSIS — Z7901 Long term (current) use of anticoagulants: Secondary | ICD-10-CM | POA: Diagnosis not present

## 2019-09-27 DIAGNOSIS — R778 Other specified abnormalities of plasma proteins: Secondary | ICD-10-CM | POA: Diagnosis not present

## 2019-09-27 DIAGNOSIS — E1129 Type 2 diabetes mellitus with other diabetic kidney complication: Secondary | ICD-10-CM | POA: Diagnosis present

## 2019-09-27 DIAGNOSIS — I10 Essential (primary) hypertension: Secondary | ICD-10-CM | POA: Diagnosis not present

## 2019-09-27 DIAGNOSIS — I4892 Unspecified atrial flutter: Secondary | ICD-10-CM | POA: Diagnosis present

## 2019-09-27 DIAGNOSIS — N183 Chronic kidney disease, stage 3 unspecified: Secondary | ICD-10-CM | POA: Diagnosis present

## 2019-09-27 DIAGNOSIS — E785 Hyperlipidemia, unspecified: Secondary | ICD-10-CM | POA: Diagnosis present

## 2019-09-27 DIAGNOSIS — I4811 Longstanding persistent atrial fibrillation: Secondary | ICD-10-CM | POA: Diagnosis present

## 2019-09-27 DIAGNOSIS — G9341 Metabolic encephalopathy: Secondary | ICD-10-CM | POA: Diagnosis present

## 2019-09-27 DIAGNOSIS — R7989 Other specified abnormal findings of blood chemistry: Secondary | ICD-10-CM | POA: Diagnosis present

## 2019-09-27 DIAGNOSIS — Z888 Allergy status to other drugs, medicaments and biological substances status: Secondary | ICD-10-CM | POA: Diagnosis not present

## 2019-09-27 DIAGNOSIS — J189 Pneumonia, unspecified organism: Principal | ICD-10-CM | POA: Diagnosis present

## 2019-09-27 DIAGNOSIS — J9621 Acute and chronic respiratory failure with hypoxia: Secondary | ICD-10-CM | POA: Diagnosis present

## 2019-09-27 DIAGNOSIS — J440 Chronic obstructive pulmonary disease with acute lower respiratory infection: Secondary | ICD-10-CM | POA: Diagnosis present

## 2019-09-27 DIAGNOSIS — Z885 Allergy status to narcotic agent status: Secondary | ICD-10-CM | POA: Diagnosis not present

## 2019-09-27 DIAGNOSIS — N1831 Chronic kidney disease, stage 3a: Secondary | ICD-10-CM | POA: Diagnosis present

## 2019-09-27 DIAGNOSIS — I130 Hypertensive heart and chronic kidney disease with heart failure and stage 1 through stage 4 chronic kidney disease, or unspecified chronic kidney disease: Secondary | ICD-10-CM | POA: Diagnosis present

## 2019-09-27 DIAGNOSIS — R001 Bradycardia, unspecified: Secondary | ICD-10-CM | POA: Diagnosis not present

## 2019-09-27 DIAGNOSIS — J439 Emphysema, unspecified: Secondary | ICD-10-CM

## 2019-09-27 DIAGNOSIS — Z87891 Personal history of nicotine dependence: Secondary | ICD-10-CM

## 2019-09-27 DIAGNOSIS — E1122 Type 2 diabetes mellitus with diabetic chronic kidney disease: Secondary | ICD-10-CM | POA: Diagnosis present

## 2019-09-27 DIAGNOSIS — Z20822 Contact with and (suspected) exposure to covid-19: Secondary | ICD-10-CM | POA: Diagnosis present

## 2019-09-27 DIAGNOSIS — Z823 Family history of stroke: Secondary | ICD-10-CM

## 2019-09-27 DIAGNOSIS — I482 Chronic atrial fibrillation, unspecified: Secondary | ICD-10-CM | POA: Diagnosis not present

## 2019-09-27 DIAGNOSIS — Z880 Allergy status to penicillin: Secondary | ICD-10-CM | POA: Diagnosis not present

## 2019-09-27 DIAGNOSIS — G4733 Obstructive sleep apnea (adult) (pediatric): Secondary | ICD-10-CM

## 2019-09-27 DIAGNOSIS — I5033 Acute on chronic diastolic (congestive) heart failure: Secondary | ICD-10-CM | POA: Diagnosis present

## 2019-09-27 DIAGNOSIS — Z7982 Long term (current) use of aspirin: Secondary | ICD-10-CM | POA: Diagnosis not present

## 2019-09-27 DIAGNOSIS — Z800 Family history of malignant neoplasm of digestive organs: Secondary | ICD-10-CM

## 2019-09-27 DIAGNOSIS — I5032 Chronic diastolic (congestive) heart failure: Secondary | ICD-10-CM | POA: Diagnosis present

## 2019-09-27 DIAGNOSIS — J449 Chronic obstructive pulmonary disease, unspecified: Secondary | ICD-10-CM | POA: Diagnosis present

## 2019-09-27 DIAGNOSIS — Z9989 Dependence on other enabling machines and devices: Secondary | ICD-10-CM | POA: Diagnosis not present

## 2019-09-27 DIAGNOSIS — Z85828 Personal history of other malignant neoplasm of skin: Secondary | ICD-10-CM

## 2019-09-27 DIAGNOSIS — I2510 Atherosclerotic heart disease of native coronary artery without angina pectoris: Secondary | ICD-10-CM | POA: Diagnosis present

## 2019-09-27 DIAGNOSIS — Z833 Family history of diabetes mellitus: Secondary | ICD-10-CM

## 2019-09-27 DIAGNOSIS — Z803 Family history of malignant neoplasm of breast: Secondary | ICD-10-CM

## 2019-09-27 DIAGNOSIS — F418 Other specified anxiety disorders: Secondary | ICD-10-CM | POA: Diagnosis present

## 2019-09-27 DIAGNOSIS — Z79899 Other long term (current) drug therapy: Secondary | ICD-10-CM

## 2019-09-27 DIAGNOSIS — Z794 Long term (current) use of insulin: Secondary | ICD-10-CM

## 2019-09-27 LAB — CBC WITH DIFFERENTIAL/PLATELET
Abs Immature Granulocytes: 0.04 10*3/uL (ref 0.00–0.07)
Basophils Absolute: 0 10*3/uL (ref 0.0–0.1)
Basophils Relative: 0 %
Eosinophils Absolute: 0.2 10*3/uL (ref 0.0–0.5)
Eosinophils Relative: 2 %
HCT: 36.9 % — ABNORMAL LOW (ref 39.0–52.0)
Hemoglobin: 11.6 g/dL — ABNORMAL LOW (ref 13.0–17.0)
Immature Granulocytes: 0 %
Lymphocytes Relative: 11 %
Lymphs Abs: 1.1 10*3/uL (ref 0.7–4.0)
MCH: 27.1 pg (ref 26.0–34.0)
MCHC: 31.4 g/dL (ref 30.0–36.0)
MCV: 86.2 fL (ref 80.0–100.0)
Monocytes Absolute: 1.1 10*3/uL — ABNORMAL HIGH (ref 0.1–1.0)
Monocytes Relative: 11 %
Neutro Abs: 7.6 10*3/uL (ref 1.7–7.7)
Neutrophils Relative %: 76 %
Platelets: 153 10*3/uL (ref 150–400)
RBC: 4.28 MIL/uL (ref 4.22–5.81)
RDW: 15.8 % — ABNORMAL HIGH (ref 11.5–15.5)
WBC: 10.1 10*3/uL (ref 4.0–10.5)
nRBC: 0 % (ref 0.0–0.2)

## 2019-09-27 LAB — TROPONIN I (HIGH SENSITIVITY)
Troponin I (High Sensitivity): 21 ng/L — ABNORMAL HIGH (ref <18)
Troponin I (High Sensitivity): 20 ng/L — ABNORMAL HIGH (ref <18)
Troponin I (High Sensitivity): 18 ng/L — ABNORMAL HIGH (ref <18)
Troponin I (High Sensitivity): 19 ng/L — ABNORMAL HIGH (ref <18)

## 2019-09-27 LAB — BASIC METABOLIC PANEL
Anion gap: 9 (ref 5–15)
BUN: 40 mg/dL — ABNORMAL HIGH (ref 8–23)
CO2: 22 mmol/L (ref 22–32)
Calcium: 8.6 mg/dL — ABNORMAL LOW (ref 8.9–10.3)
Chloride: 106 mmol/L (ref 98–111)
Creatinine, Ser: 2.16 mg/dL — ABNORMAL HIGH (ref 0.61–1.24)
GFR calc Af Amer: 33 mL/min — ABNORMAL LOW (ref >60)
GFR calc non Af Amer: 28 mL/min — ABNORMAL LOW (ref >60)
Glucose, Bld: 94 mg/dL (ref 70–99)
Potassium: 4.8 mmol/L (ref 3.5–5.1)
Sodium: 137 mmol/L (ref 135–145)

## 2019-09-27 LAB — GLUCOSE, CAPILLARY
Glucose-Capillary: 111 mg/dL — ABNORMAL HIGH (ref 70–99)
Glucose-Capillary: 188 mg/dL — ABNORMAL HIGH (ref 70–99)
Glucose-Capillary: 231 mg/dL — ABNORMAL HIGH (ref 70–99)
Glucose-Capillary: 90 mg/dL (ref 70–99)

## 2019-09-27 LAB — RESPIRATORY PANEL BY RT PCR (FLU A&B, COVID)
Influenza A by PCR: NEGATIVE
Influenza B by PCR: NEGATIVE
SARS Coronavirus 2 by RT PCR: NEGATIVE

## 2019-09-27 LAB — BRAIN NATRIURETIC PEPTIDE: B Natriuretic Peptide: 382 pg/mL — ABNORMAL HIGH (ref 0.0–100.0)

## 2019-09-27 MED ORDER — CRANBERRY 500 MG PO CAPS: 820.0000 mg | ORAL_CAPSULE | Freq: Every day | ORAL | Status: DC

## 2019-09-27 MED ORDER — ACETAMINOPHEN 325 MG PO TABS: 650.0000 mg | ORAL_TABLET | Freq: Four times a day (QID) | ORAL | Status: DC | PRN

## 2019-09-27 MED ORDER — FUROSEMIDE 20 MG PO TABS
20.0000 mg | ORAL_TABLET | Freq: Every day | ORAL | Status: DC
  Administered 2019-09-28 – 2019-10-02 (×5): 20 mg via ORAL
  Filled 2019-09-27 (×5): qty 1

## 2019-09-27 MED ORDER — MOMETASONE FURO-FORMOTEROL FUM 200-5 MCG/ACT IN AERO
2.0000 | INHALATION_SPRAY | Freq: Two times a day (BID) | RESPIRATORY_TRACT | Status: DC
  Administered 2019-09-27 – 2019-10-02 (×10): 2 via RESPIRATORY_TRACT
  Filled 2019-09-27 (×2): qty 8.8

## 2019-09-27 MED ORDER — PRAVASTATIN SODIUM 20 MG PO TABS
20.0000 mg | ORAL_TABLET | Freq: Every evening | ORAL | Status: DC
  Administered 2019-09-27 – 2019-10-01 (×5): 20 mg via ORAL
  Filled 2019-09-27 (×5): qty 1

## 2019-09-27 MED ORDER — PANTOPRAZOLE SODIUM 40 MG PO TBEC
40.0000 mg | DELAYED_RELEASE_TABLET | Freq: Every day | ORAL | Status: DC
  Administered 2019-09-28 – 2019-10-02 (×5): 40 mg via ORAL
  Filled 2019-09-27 (×5): qty 1

## 2019-09-27 MED ORDER — SODIUM CHLORIDE 0.9 % IV SOLN
500.0000 mg | INTRAVENOUS | Status: AC
  Administered 2019-09-27 – 2019-10-01 (×5): 500 mg via INTRAVENOUS
  Filled 2019-09-27 (×5): qty 500

## 2019-09-27 MED ORDER — SODIUM CHLORIDE 0.9% FLUSH
10.0000 mL | Freq: Two times a day (BID) | INTRAVENOUS | Status: DC
  Administered 2019-09-27 – 2019-10-02 (×10): 10 mL via INTRAVENOUS

## 2019-09-27 MED ORDER — ONDANSETRON HCL 4 MG/2ML IJ SOLN: 4.0000 mg | Freq: Three times a day (TID) | INTRAMUSCULAR | Status: DC | PRN

## 2019-09-27 MED ORDER — DIAZEPAM 5 MG PO TABS: 5.0000 mg | ORAL_TABLET | Freq: Two times a day (BID) | ORAL | Status: DC | PRN

## 2019-09-27 MED ORDER — PREGABALIN 25 MG PO CAPS
25.0000 mg | ORAL_CAPSULE | Freq: Two times a day (BID) | ORAL | Status: DC
  Administered 2019-09-27 – 2019-10-02 (×10): 25 mg via ORAL
  Filled 2019-09-27 (×10): qty 1

## 2019-09-27 MED ORDER — INSULIN GLARGINE 100 UNIT/ML ~~LOC~~ SOLN
30.0000 [IU] | Freq: Every day | SUBCUTANEOUS | Status: DC
  Administered 2019-09-27: 30 [IU] via SUBCUTANEOUS
  Filled 2019-09-27 (×2): qty 0.3

## 2019-09-27 MED ORDER — ASCORBIC ACID 500 MG PO TABS
500.0000 mg | ORAL_TABLET | Freq: Every day | ORAL | Status: DC
  Administered 2019-09-28 – 2019-10-02 (×5): 500 mg via ORAL
  Filled 2019-09-27 (×5): qty 1

## 2019-09-27 MED ORDER — ALBUTEROL SULFATE (2.5 MG/3ML) 0.083% IN NEBU: 2.5000 mg | INHALATION_SOLUTION | RESPIRATORY_TRACT | Status: DC | PRN

## 2019-09-27 MED ORDER — METOPROLOL TARTRATE 50 MG PO TABS
100.0000 mg | ORAL_TABLET | Freq: Two times a day (BID) | ORAL | Status: DC
  Administered 2019-09-28 – 2019-09-29 (×3): 100 mg via ORAL
  Filled 2019-09-27 (×4): qty 2

## 2019-09-27 MED ORDER — APIXABAN 2.5 MG PO TABS
2.5000 mg | ORAL_TABLET | Freq: Two times a day (BID) | ORAL | Status: DC
  Administered 2019-09-27 – 2019-09-28 (×2): 2.5 mg via ORAL
  Filled 2019-09-27 (×2): qty 1

## 2019-09-27 MED ORDER — ASPIRIN EC 81 MG PO TBEC
81.0000 mg | DELAYED_RELEASE_TABLET | Freq: Every day | ORAL | Status: DC
  Administered 2019-09-28 – 2019-10-02 (×5): 81 mg via ORAL
  Filled 2019-09-27 (×5): qty 1

## 2019-09-27 MED ORDER — CITALOPRAM HYDROBROMIDE 20 MG PO TABS
20.0000 mg | ORAL_TABLET | Freq: Every day | ORAL | Status: DC
  Administered 2019-09-28 – 2019-10-01 (×4): 20 mg via ORAL
  Filled 2019-09-27 (×4): qty 1

## 2019-09-27 MED ORDER — DM-GUAIFENESIN ER 30-600 MG PO TB12
1.0000 | ORAL_TABLET | Freq: Two times a day (BID) | ORAL | Status: DC
  Administered 2019-09-27 – 2019-10-02 (×10): 1 via ORAL
  Filled 2019-09-27 (×10): qty 1

## 2019-09-27 MED ORDER — INSULIN ASPART 100 UNIT/ML ~~LOC~~ SOLN
0.0000 [IU] | SUBCUTANEOUS | Status: DC
  Administered 2019-09-27: 3 [IU] via SUBCUTANEOUS
  Administered 2019-09-28: 2 [IU] via SUBCUTANEOUS
  Administered 2019-09-28: 7 [IU] via SUBCUTANEOUS
  Administered 2019-09-28: 3 [IU] via SUBCUTANEOUS
  Administered 2019-09-28: 5 [IU] via SUBCUTANEOUS
  Administered 2019-09-29: 3 [IU] via SUBCUTANEOUS
  Administered 2019-09-29 (×2): 2 [IU] via SUBCUTANEOUS
  Administered 2019-09-29 – 2019-09-30 (×3): 5 [IU] via SUBCUTANEOUS
  Administered 2019-09-30: 1 [IU] via SUBCUTANEOUS
  Administered 2019-09-30: 5 [IU] via SUBCUTANEOUS
  Administered 2019-09-30: 1 [IU] via SUBCUTANEOUS
  Filled 2019-09-27 (×14): qty 1

## 2019-09-27 MED ORDER — LISINOPRIL 5 MG PO TABS
5.0000 mg | ORAL_TABLET | Freq: Every day | ORAL | Status: DC
  Administered 2019-09-28 – 2019-10-02 (×5): 5 mg via ORAL
  Filled 2019-09-27 (×5): qty 1

## 2019-09-27 MED ORDER — SODIUM CHLORIDE 0.9 % IV SOLN
2.0000 g | INTRAVENOUS | Status: AC
  Administered 2019-09-27 – 2019-10-01 (×5): 2 g via INTRAVENOUS
  Filled 2019-09-27: qty 20
  Filled 2019-09-27 (×3): qty 2
  Filled 2019-09-27: qty 20

## 2019-09-27 MED ORDER — AMLODIPINE BESYLATE 5 MG PO TABS
5.0000 mg | ORAL_TABLET | Freq: Every day | ORAL | Status: DC
  Administered 2019-09-28: 5 mg via ORAL
  Filled 2019-09-27: qty 1

## 2019-09-27 MED ORDER — IPRATROPIUM BROMIDE 0.02 % IN SOLN
2.5000 mL | RESPIRATORY_TRACT | Status: DC
  Administered 2019-09-27 – 2019-09-29 (×12): 0.5 mg via RESPIRATORY_TRACT
  Filled 2019-09-27 (×12): qty 2.5

## 2019-09-27 NOTE — ED Provider Notes (Signed)
[facility] Emergency Department Provider Note ____________________________________________   First MD Initiated Contact with Patient 09/27/19 1334     (approximate)  I have reviewed the triage vital signs and the nursing notes.   HISTORY  Chief Complaint Shortness of Breath and Fatigue    HPI Ernest Mclean is a 79 y.o. male with PMH as noted below who presents with generalized weakness over the last several days, gradual onset, and associated with shortness of breath especially with exertion.  Per the wife, he has also had a few episodes of confusion and has needed oxygen during the day (when he normally only uses 2L when sleeping).  The patient has noted a cough but no chest pain or fever.   Past Medical History:  Diagnosis Date  . Atrial fibrillation ([place])   . Basal cell carcinoma 06/2017   Left Ear  . CAD (coronary artery disease)   . CKD (chronic kidney disease)   . Congestive heart failure ([place])   . COPD (chronic obstructive pulmonary disease) ([place])   . Diabetes ([place])   . Hypertension   . OSA on CPAP   . 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 Atrial fibrillation, chronic ([place]) 09/27/2019  . CAP (community acquired pneumonia) 09/27/2019  . Acute on chronic respiratory failure with hypoxia ([place]) 09/27/2019  . Elevated troponin 09/27/2019  . Acute metabolic encephalopathy [id number]  . Pneumonia 01/03/2019  . 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 Atherosclerosis of native arteries of extremity with intermittent claudication ([place]) 05/08/2017  . Atherosclerosis of native arteries of the extremities with ulceration ([place]) 03/20/2017  . CKD (chronic kidney disease), stage IIIa 03/20/2017  . Chronic diastolic CHF (congestive heart failure) ([place]) 08/01/2016  . Anxiety 02/22/2016  . Chronic respiratory failure ([place]) 08/10/2015  . Hypoglycemia 05/12/2015  . Type II diabetes  mellitus with renal manifestations ([place]) 05/12/2015  . HTN (hypertension) 05/12/2015  . CAD (coronary artery disease) 05/12/2015  . Atrial flutter ([place]) 05/12/2015  . GERD (gastroesophageal reflux disease) 05/12/2015  . Solitary pulmonary nodule 02/10/2015  . OSA on CPAP 08/17/2014  . COPD (chronic obstructive pulmonary disease) ([place]) 08/17/2014    Past Surgical History:  Procedure Laterality Date  . CLAVICLE SURGERY    . heart bypass    . HERNIA REPAIR    . MOHS SURGERY  07/10/2017   Head    Prior to Admission medications   Medication Sig Start Date End Date Taking? Authorizing Provider  albuterol (PROVENTIL HFA;VENTOLIN HFA) 108 (90 Base) MCG/ACT inhaler Inhale 2 puffs into the lungs every 4 (four) hours as needed for wheezing or shortness of breath. 02/18/18  Yes Kasa, Maretta Bees, MD  amLODipine (NORVASC) 5 MG tablet Take 5 mg by mouth daily.  08/17/14  Yes [provider]  apixaban (ELIQUIS) 5 MG TABS tablet Take 2.5 mg by mouth 2 (two) times daily.    Yes [provider]  aspirin EC 81 MG tablet Take 81 mg by mouth daily. 08/17/14  Yes [provider]  citalopram (CELEXA) 20 MG tablet Take 20 mg by mouth at bedtime.  07/30/14  Yes [provider]  CRANBERRY PO Take 820 mg by mouth daily.   Yes [provider]  cyanocobalamin 500 MCG tablet Take 500 mcg by mouth daily.   Yes [provider]  diazepam (VALIUM) 5 MG tablet Take 5 mg by mouth every 12 (twelve) hours  as needed for anxiety.  10/24/13  Yes [provider]  ergocalciferol (VITAMIN D2) 50000 units capsule Take 50,000 Units by mouth every 30 (thirty) days.   Yes [provider]  furosemide (LASIX) 20 MG tablet Take 20 mg by mouth daily.    Yes [provider]  HUMALOG 100 UNIT/ML injection Inject 10-25 Units into the skin 3 (three) times daily with meals. (sliding scale as needed to a maximum of 150u daily) 06/24/19  Yes [provider]  insulin  glargine (LANTUS) 100 UNIT/ML injection Inject 60 Units into the skin at bedtime.    Yes [provider]  lisinopril (PRINIVIL,ZESTRIL) 5 MG tablet Take 5 mg by mouth daily.   Yes [provider]  metFORMIN (GLUCOPHAGE-XR) 500 MG 24 hr tablet Take 1,000 mg by mouth daily with supper.   Yes [provider]  metoprolol (LOPRESSOR) 100 MG tablet Take 100 mg by mouth 2 (two) times daily. 06/17/14  Yes [provider]  OMEGA-3 FATTY ACIDS-VITAMIN E PO Take 1 capsule by mouth daily.   Yes [provider]  omeprazole (PRILOSEC) 20 MG capsule Take 20 mg by mouth daily.   Yes [provider]  pravastatin (PRAVACHOL) 20 MG tablet Take 20 mg by mouth every evening.   Yes [provider]  pregabalin (LYRICA) 25 MG capsule Take 25 mg by mouth 2 (two) times daily.   Yes [provider]  tiotropium (SPIRIVA) 18 MCG inhalation capsule Place 18 mcg into inhaler and inhale daily. 07/31/14  Yes [provider]  vitamin C (ASCORBIC ACID) 500 MG tablet Take 500 mg by mouth daily.   Yes [provider]  WIXELA INHUB 500-50 MCG/DOSE AEPB USE 1 INHALATION BY MOUTH TWICE DAILY Patient taking differently: Inhale 1 puff into the lungs 2 (two) times daily.  04/16/19  Yes Flora Lipps, MD    Allergies Codeine, Hydralazine, and Penicillins  Family History  Problem Relation Age of Onset  . Stroke Other   . Diabetes Other   . Breast cancer Other   . Colon cancer Other     Social History Social History   Tobacco Use  . Smoking status: Former Smoker    Packs/day: 1.00    Years: 30.00    Pack years: 30.00    Types: Cigarettes    Quit date: 12/14/2001    Years since quitting: 17.7  . Smokeless tobacco: Never Used  . Tobacco comment: quit 12/14/2001  Substance Use Topics  . Alcohol use: No    Alcohol/week: 0.0 standard drinks  . Drug use: No    Review of Systems  Constitutional: No fever.  Positive for weakness.  Eyes: No  visual changes. ENT: No sore throat. Cardiovascular: Denies chest pain. Respiratory: Positive for shortness of breath. Gastrointestinal: No vomiting or diarrhea.  Genitourinary: Negative for dysuria.  Musculoskeletal: Negative for back pain. Skin: Negative for rash. Neurological: Negative for headache.   ____________________________________________   PHYSICAL EXAM:  VITAL SIGNS: ED Triage Vitals  Enc Vitals Group     BP 09/27/19 1033 (!) 114/42     Pulse Rate 09/27/19 1033 (!) 57     Resp 09/27/19 1033 18     Temp 09/27/19 1036 98.3 F (36.8 C)     Temp Source 09/27/19 1033 Oral     SpO2 09/27/19 1033 93 %     Weight 09/27/19 1034 221 lb (100.2 kg)     Height 09/27/19 1034 6' (1.829 m)     Head Circumference --  Peak Flow --      Pain Score 09/27/19 1034 0     Pain Loc --      Pain Edu? --      Excl. in [place]? --     Constitutional: Alert and oriented. Slightly weak appearing but in no acute distress. Eyes: Conjunctivae are normal.  Head: Atraumatic. Nose: No congestion/rhinnorhea. Mouth/Throat: Mucous membranes are moist.   Neck: Normal range of motion.  Cardiovascular: Normal rate, regular rhythm. Grossly normal heart sounds.  Good peripheral circulation. Respiratory: Normal respiratory effort.  No retractions. Lungs CTAB. Gastrointestinal: Soft and nontender. No distention.  Genitourinary: No flank tenderness. Musculoskeletal: Extremities warm and well perfused.  Neurologic:  Normal speech and language. No gross focal neurologic deficits are appreciated.  Skin:  Skin is warm and dry. No rash noted. Psychiatric: Mood and affect are normal. Speech and behavior are normal.  ____________________________________________   LABS (all labs ordered are listed, but only abnormal results are displayed)  Labs Reviewed  GLUCOSE, CAPILLARY - Abnormal; Notable for the following components:      Result Value   Glucose-Capillary 111 (*)    All other components within  normal limits  CBC WITH DIFFERENTIAL/PLATELET - Abnormal; Notable for the following components:   Hemoglobin 11.6 (*)    HCT 36.9 (*)    RDW 15.8 (*)    Monocytes Absolute 1.1 (*)    All other components within normal limits  BASIC METABOLIC PANEL - Abnormal; Notable for the following components:   BUN 40 (*)    Creatinine, Ser 2.16 (*)    Calcium 8.6 (*)    GFR calc non Af Amer 28 (*)    GFR calc Af Amer 33 (*)    All other components within normal limits  BRAIN NATRIURETIC PEPTIDE - Abnormal; Notable for the following components:   B Natriuretic Peptide 382.0 (*)    All other components within normal limits  TROPONIN I (HIGH SENSITIVITY) - Abnormal; Notable for the following components:   Troponin I (High Sensitivity) 21 (*)    All other components within normal limits  TROPONIN I (HIGH SENSITIVITY) - Abnormal; Notable for the following components:   Troponin I (High Sensitivity) 18 (*)    All other components within normal limits  TROPONIN I (HIGH SENSITIVITY) - Abnormal; Notable for the following components:   Troponin I (High Sensitivity) 20 (*)    All other components within normal limits  RESPIRATORY PANEL BY RT PCR (FLU A&B, COVID)  CULTURE, BLOOD (ROUTINE X 2)  CULTURE, BLOOD (ROUTINE X 2)  EXPECTORATED SPUTUM ASSESSMENT W REFEX TO RESP CULTURE  GLUCOSE, CAPILLARY  LEGIONELLA PNEUMOPHILA SEROGP 1 UR AG  STREP PNEUMONIAE URINARY ANTIGEN  HEMOGLOBIN A1C  LIPID PANEL  TROPONIN I (HIGH SENSITIVITY)   ____________________________________________  EKG  ED ECG REPORT I, Arta Silence, the attending physician, personally viewed and interpreted this ECG.  Date: 09/27/2019 EKG Time: 1047 Rate: 58 Rhythm: normal sinus rhythm QRS Axis: left axis Intervals: nonspecific IVCD ST/T Wave abnormalities: nonspecific abnormalities Narrative Interpretation: nonspecific abnormalities with no evidence of acute  ischemia ____________________________________________  RADIOLOGY  CXR: Infiltrates consistent with multifocal pneumonia, right lung  ____________________________________________   PROCEDURES  Procedure(s) performed: No  Procedures  Critical Care performed: No ____________________________________________   INITIAL IMPRESSION / ASSESSMENT AND PLAN / ED COURSE  Pertinent labs & imaging results that were available during my care of the patient were reviewed by me and considered in my medical decision making (see chart for details).  79 year old male with PMH as noted above presents with generalized weakness, shortness of breath, and increased need for O2 over the last several days.  I discussed with the wife who adds that the patient has had intermittent confusion, and had an O2 saturation earlier today on room air in the 80s.   I reviewed the past medical records in Epic; the patient has had no recent admissions within the last 90 days.  On exam, his vital signs are normal, he is currently alert and oriented x4 and has no respiratory distress or increased work of breathing.  The exam is otherwise as described above.  Differential includes acute bronchitis, pneumonia, other infection such as UTI, or CHF or other cardiac cause, AKI or other metabolic cause.  We will obtain a chest X-ray, labs, and reassess.   ----------------------------------------- 2:38 PM on 09/27/2019 -----------------------------------------  Work-up reveals multilobar pneumonia on the right.  The patient's Covid is negative.  Given his hypoxia, generalized weakness, and intermittent delirium, he will benefit from admission for further treatment.  He has had no recent admissions so I treated with empiric antibiotics for CAP.  I discussed his case with the hospitalist Dr. Blaine Hamper.  ____________________________  [name] was evaluated in Emergency Department on 09/27/2019 for the symptoms described in the  history of present illness. He was evaluated in the context of the global COVID-19 pandemic, which necessitated consideration that the patient might be at risk for infection with the SARS-CoV-2 virus that causes COVID-19. Institutional protocols and algorithms that pertain to the evaluation of patients at risk for COVID-19 are in a state of rapid change based on information released by regulatory bodies including the CDC and federal and state organizations. These policies and algorithms were followed during the patient's care in the ED.  ____________________________________________   FINAL CLINICAL IMPRESSION(S) / ED DIAGNOSES  Final diagnoses:  Community acquired pneumonia of right lung, unspecified part of lung      NEW MEDICATIONS STARTED DURING THIS VISIT:  Current Discharge Medication List       Note:  This document was prepared using Dragon voice recognition software and may include unintentional dictation errors.   Arta Silence, MD 09/27/19 2003

## 2019-09-27 NOTE — Progress Notes (Signed)
PHARMACIST - PHYSICIAN ORDER COMMUNICATION  CONCERNING: P&T Medication Policy on Herbal Medications  DESCRIPTION:  This patient's order for:  Cranberry Caps  has been noted.  This product(s) is classified as an "herbal" or natural product. Due to a lack of definitive safety studies or FDA approval, nonstandard manufacturing practices, plus the potential risk of unknown drug-drug interactions while on inpatient medications, the Pharmacy and Therapeutics Committee does not permit the use of "herbal" or natural products of this type within [facility].   ACTION TAKEN: The pharmacy department is unable to verify this order at this time. Please reevaluate patient's clinical condition at discharge and address if the herbal or natural product(s) should be resumed at that time.  Prudy Feeler, RPh 09/27/2019 3:55 PM

## 2019-09-27 NOTE — ED Notes (Signed)
Report given to Darlene, RN

## 2019-09-27 NOTE — ED Notes (Signed)
Pt resting in bed with eyes closed and even respirations- snack placed on bedside table

## 2019-09-27 NOTE — Progress Notes (Signed)
Patient refused to apply non skid socks. Explain safety precautions.

## 2019-09-27 NOTE — ED Notes (Signed)
Pt trx to x-ray 

## 2019-09-27 NOTE — H&P (Signed)
History and Physical    Ernest Mclean [id number] DOB: 03/19/1941 DOA: 09/27/2019  Referring MD/NP/PA:   PCP: Juluis Pitch, MD   Patient coming from:  The patient is coming from home.  At baseline, pt is independent for most of ADL.        Chief Complaint: Confusion, shortness of breath  HPI: Ernest Mclean is a 79 y.o. male with medical history significant of hypertension, hyperlipidemia, diabetes mellitus, COPD, OSA on CPAP, GERD, depression with anxiety, CKD stage III, atrial fibrillation on Eliquis, CAD, dCHF, who presents with confusion and shortness of breath.  Patient symptoms are started yesterday, including intermittent confusion, generalized weakness, decreased oral intake, dry cough and shortness of breath. Patient was found to have oxygen desaturation to 88% on room air in ED, requiring 2 L nasal cannula oxygen with oxygen saturation 97%.  Patient does not have chest pain, fever or chills.  Patient has nausea, but no vomiting, diarrhea or abdominal pain.  No symptoms of UTI or unilateral weakness.  Patient's mental status has improved.  Currently patient is alert, oriented x3.  Mental status is back to baseline now.  ED Course: pt was found to have WBC 10.1, troponin 21, BNP 382, negative COVID-19 PCR, slightly worsened renal function, temperature normal, blood pressure 145/84, heart rate 57. CXR showed ill-defined airspace opacity right mid lung and left base. Suspect multifocal pneumonia, although patchy areas of pulmonary edema could present similarly.  Patient is admitted to progressive bed as inpatient.   Review of Systems:   General: no fevers, chills, no body weight gain, has poor appetite, has fatigue HEENT: no blurry vision, hearing changes or sore throat Respiratory: has dyspnea, coughing, no wheezing CV: no chest pain, no palpitations GI: has nausea, no vomiting, abdominal pain, diarrhea, constipation GU: no dysuria, burning on urination, increased urinary  frequency, hematuria  Ext: no leg edema Neuro: no unilateral weakness, numbness, or tingling, no vision change or hearing loss. Had confusion. Skin: no rash, no skin tear. MSK: No muscle spasm, no deformity, no limitation of range of movement in spin Heme: No easy bruising.  Travel history: No recent long distant travel.  Allergy:  Allergies  Allergen Reactions  . Codeine Hives, Rash and Swelling  . Hydralazine Other (See Comments)    tongue swollen and couldn't wake up ---- not positive it was this or a mix of this with something else or high sugar  . Penicillins Other (See Comments)    Passed out (at 79 yrs old) Has patient had a PCN reaction causing immediate rash, facial/tongue/throat swelling, SOB or lightheadedness with hypotension: No Has patient had a PCN reaction causing severe rash involving mucus membranes or skin necrosis: No Has patient had a PCN reaction that required hospitalization No Has patient had a PCN reaction occurring within the last 10 years: No If all of the above answers are "NO", then may proceed with Cephalosporin use.     Past Medical History:  Diagnosis Date  . Atrial fibrillation ([place])   . Basal cell carcinoma 06/2017   Left Ear  . CAD (coronary artery disease)   . CKD (chronic kidney disease)   . Congestive heart failure ([place])   . COPD (chronic obstructive pulmonary disease) ([place])   . Diabetes ([place])   . Hypertension   . OSA on CPAP   . 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 CLAVICLE SURGERY    .  heart bypass    . HERNIA REPAIR    . MOHS SURGERY  07/10/2017   Head    Social History:  reports that he quit smoking about 17 years ago. His smoking use included cigarettes. He has a 30.00 pack-year smoking history. He has never used smokeless tobacco. He reports that he does not drink alcohol or use drugs.  Family History:  Family History  Problem Relation Age of Onset  . Stroke  Other   . Diabetes Other   . Breast cancer Other   . Colon cancer Other      Prior to Admission medications   Medication Sig Start Date End Date Taking? Authorizing Provider  albuterol (PROVENTIL HFA;VENTOLIN HFA) 108 (90 Base) MCG/ACT inhaler Inhale 2 puffs into the lungs every 4 (four) hours as needed for wheezing or shortness of breath. 02/18/18   Flora Lipps, MD  amLODipine (NORVASC) 5 MG tablet Take 5 mg by mouth daily.  08/17/14   [provider]  apixaban (ELIQUIS) 5 MG TABS tablet Take 2.5 mg by mouth 2 (two) times daily. 0.5 tablets b 08/17/14   [provider]  aspirin EC 81 MG tablet Take 81 mg by mouth daily. 08/17/14   [provider]  citalopram (CELEXA) 20 MG tablet Take 20 mg by mouth at bedtime.  07/30/14   [provider]  CRANBERRY PO Take 820 mg by mouth daily.    [provider]  cyanocobalamin 500 MCG tablet Take 500 mcg by mouth daily.    [provider]  diazepam (VALIUM) 5 MG tablet Take 5 mg by mouth every 12 (twelve) hours as needed for anxiety.  10/24/13   [provider]  ergocalciferol (VITAMIN D2) 50000 units capsule Take 50,000 Units by mouth every 30 (thirty) days.    [provider]  furosemide (LASIX) 20 MG tablet Take 20 mg by mouth daily.     [provider]  HUMALOG 100 UNIT/ML injection  06/24/19   [provider]  insulin glargine (LANTUS) 100 UNIT/ML injection Inject 60 Units into the skin at bedtime.     [provider]  lisinopril (PRINIVIL,ZESTRIL) 5 MG tablet Take 5 mg by mouth daily.    [provider]  metoprolol (LOPRESSOR) 100 MG tablet Take 100 mg by mouth 2 (two) times daily. 06/17/14   [provider]  OMEGA-3 FATTY ACIDS-VITAMIN E PO Take 1 capsule by mouth daily.    [provider]  omeprazole (PRILOSEC) 20 MG capsule Take 20 mg by mouth daily.    [provider]  pravastatin (PRAVACHOL) 20 MG tablet Take 20 mg by  mouth at bedtime. 07/31/14   [provider]  pregabalin (LYRICA) 25 MG capsule Take 25 mg by mouth 2 (two) times daily.    [provider]  tiotropium (SPIRIVA) 18 MCG inhalation capsule Place 18 mcg into inhaler and inhale daily. 07/31/14   [provider]  vitamin C (ASCORBIC ACID) 500 MG tablet Take 500 mg by mouth daily.    [provider]  Grant Ruts INHUB 500-50 MCG/DOSE AEPB USE 1 INHALATION BY MOUTH TWICE DAILY 04/16/19   Flora Lipps, MD    Physical Exam: Vitals:   09/27/19 1141 09/27/19 1142 09/27/19 1143 09/27/19 1144  BP:      Pulse:      Resp:      Temp:      TempSrc:      SpO2: (!) 89% 90% 90% 97%  Weight:  Height:       General: Not in acute distress HEENT:       Eyes: PERRL, EOMI, no scleral icterus.       ENT: No discharge from the ears and nose, no pharynx injection, no tonsillar enlargement.        Neck: No JVD, no bruit, no mass felt. Heme: No neck lymph node enlargement. Cardiac: S1/S2, RRR, No murmurs, No gallops or rubs. Respiratory: No rales, wheezing, rhonchi or rubs. GI: Soft, nondistended, nontender, no rebound pain, no organomegaly, BS present. GU: No hematuria Ext: No pitting leg edema bilaterally. 2+DP/PT pulse bilaterally. Musculoskeletal: No joint deformities, No joint redness or warmth, no limitation of ROM in spin. Skin: No rashes.  Neuro: Alert, oriented X3, cranial nerves II-XII grossly intact, moves all extremities. Psych: Patient is not psychotic, no suicidal or hemocidal ideation.  Labs on Admission: I have personally reviewed following labs and imaging studies  CBC: Recent Labs  Lab 09/27/19 1043  WBC 10.1  NEUTROABS 7.6  HGB 11.6*  HCT 36.9*  MCV 86.2  PLT 0000000   Basic Metabolic Panel: Recent Labs  Lab 09/27/19 1043  NA 137  K 4.8  CL 106  CO2 22  GLUCOSE 94  BUN 40*  CREATININE 2.16*  CALCIUM 8.6*   GFR: Estimated Creatinine Clearance: 35.1 mL/min (A) (by C-G formula based on SCr  of 2.16 mg/dL (H)). Liver Function Tests: No results for input(s): AST, ALT, ALKPHOS, BILITOT, PROT, ALBUMIN in the last 168 hours. No results for input(s): LIPASE, AMYLASE in the last 168 hours. No results for input(s): AMMONIA in the last 168 hours. Coagulation Profile: No results for input(s): INR, PROTIME in the last 168 hours. Cardiac Enzymes: No results for input(s): CKTOTAL, CKMB, CKMBINDEX, TROPONINI in the last 168 hours. BNP (last 3 results) No results for input(s): PROBNP in the last 8760 hours. HbA1C: No results for input(s): HGBA1C in the last 72 hours. CBG: Recent Labs  Lab 09/27/19 1028 09/27/19 1328  GLUCAP 111* 90   Lipid Profile: No results for input(s): CHOL, HDL, LDLCALC, TRIG, CHOLHDL, LDLDIRECT in the last 72 hours. Thyroid Function Tests: No results for input(s): TSH, T4TOTAL, FREET4, T3FREE, THYROIDAB in the last 72 hours. Anemia Panel: No results for input(s): VITAMINB12, FOLATE, FERRITIN, TIBC, IRON, RETICCTPCT in the last 72 hours. Urine analysis:    Component Value Date/Time   COLORURINE YELLOW (A) 07/09/2015 1532   APPEARANCEUR CLEAR (A) 07/09/2015 1532   LABSPEC 1.009 07/09/2015 1532   PHURINE 5.0 07/09/2015 1532   GLUCOSEU 150 (A) 07/09/2015 1532   HGBUR NEGATIVE 07/09/2015 1532   BILIRUBINUR NEGATIVE 07/09/2015 1532   KETONESUR NEGATIVE 07/09/2015 1532   PROTEINUR 30 (A) 07/09/2015 1532   NITRITE NEGATIVE 07/09/2015 1532   LEUKOCYTESUR 3+ (A) 07/09/2015 1532   Sepsis Labs: @LABRCNTIP (procalcitonin:4,lacticidven:4) ) Recent Results (from the past 240 hour(s))  Respiratory Panel by RT PCR (Flu A&B, Covid) - Nasopharyngeal Swab     Status: None   Collection Time: 09/27/19 12:06 PM   Specimen: Nasopharyngeal Swab  Result Value Ref Range Status   SARS Coronavirus 2 by RT PCR NEGATIVE NEGATIVE Final    Comment: (NOTE) SARS-CoV-2 target nucleic acids are NOT DETECTED. The SARS-CoV-2 RNA is generally detectable in upper respiratoy specimens  during the acute phase of infection. The lowest concentration of SARS-CoV-2 viral copies this assay can detect is 131 copies/mL. A negative result does not preclude SARS-Cov-2 infection and should not be used as the sole basis for treatment or  other patient management decisions. A negative result may occur with  improper specimen collection/handling, submission of specimen other than nasopharyngeal swab, presence of viral mutation(s) within the areas targeted by this assay, and inadequate number of viral copies (<131 copies/mL). A negative result must be combined with clinical observations, patient history, and epidemiological information. The expected result is Negative. Fact Sheet for Patients:  PinkCheek.be Fact Sheet for Healthcare Providers:  GravelBags.it This test is not yet ap proved or cleared by the [place] FDA and  has been authorized for detection and/or diagnosis of SARS-CoV-2 by FDA under an Emergency Use Authorization (EUA). This EUA will remain  in effect (meaning this test can be used) for the duration of the COVID-19 declaration under Section 564(b)(1) of the Act, 21 U.S.C. section 360bbb-3(b)(1), unless the authorization is terminated or revoked sooner.    Influenza A by PCR NEGATIVE NEGATIVE Final   Influenza B by PCR NEGATIVE NEGATIVE Final    Comment: (NOTE) The Xpert Xpress SARS-CoV-2/FLU/RSV assay is intended as an aid in  the diagnosis of influenza from Nasopharyngeal swab specimens and  should not be used as a sole basis for treatment. Nasal washings and  aspirates are unacceptable for Xpert Xpress SARS-CoV-2/FLU/RSV  testing. Fact Sheet for Patients: PinkCheek.be Fact Sheet for Healthcare Providers: GravelBags.it This test is not yet approved or cleared by the [place] FDA and  has been authorized for detection and/or diagnosis of  SARS-CoV-2 by  FDA under an Emergency Use Authorization (EUA). This EUA will remain  in effect (meaning this test can be used) for the duration of the  Covid-19 declaration under Section 564(b)(1) of the Act, 21  U.S.C. section 360bbb-3(b)(1), unless the authorization is  terminated or revoked. Performed at [facility], [address] [place], [place] [address]      Radiological Exams on Admission: DG Chest 2 View  Result Date: 09/27/2019 CLINICAL DATA:  Shortness of breath EXAM: CHEST - 2 VIEW COMPARISON:  June 06, 2019 FINDINGS: There is ill-defined airspace opacity in the right mid lung as well as in the left base. Lungs elsewhere are clear. Heart is upper normal in size with pulmonary vascularity normal. Patient is status post median sternotomy. No adenopathy. No bone lesions. IMPRESSION: Ill-defined airspace opacity right mid lung and left base. Suspect multifocal pneumonia, although patchy areas of pulmonary edema could present similarly. Heart upper normal in size. No adenopathy evident. Electronically Signed   By: Lowella Grip III M.D.   On: 09/27/2019 11:17     EKG: Independently reviewed.  Atrial fibrillation, QTC 445, LAD, poor R wave progression   Assessment/Plan Principal Problem:   CAP (community acquired pneumonia) Active Problems:   OSA on CPAP   COPD (chronic obstructive pulmonary disease) (HCC)   Type II diabetes mellitus with renal manifestations (HCC)   HTN (hypertension)   CAD (coronary artery disease)   GERD (gastroesophageal reflux disease)   Chronic diastolic CHF (congestive heart failure) (HCC)   CKD (chronic kidney disease), stage IIIa   Atrial fibrillation, chronic (HCC)   Acute on chronic respiratory failure with hypoxia (HCC)   Elevated troponin   Acute metabolic encephalopathy    Acute on chronic respiratory failure with hypoxia due to CAP (community acquired pneumonia): -Admitted to progressive unit as inpatient - IV Rocephin  and azithromycin - Mucinex for cough  - ronchodilators - Urine legionella and S. pneumococcal antigen - Follow up blood culture x2, sputum culture   OSA - on CPAP  COPD (chronic  obstructive pulmonary disease) ([place]): No wheezing rhonchi on auscultation. -Bronchodilators  Type II diabetes mellitus with renal manifestations ([place]): Most recent A1c 7.9, poorly controled. Patient is taking Metformin, Humalog, Lantus at home -will decrease Lantus dose from 60 to 30 units daily -SSI  HTN:  -Continue home medications: Amlodipine, lisinopril, metoprolol -hydralazine prn  CAD (coronary artery disease) and elevated trop: trop minimally elevated at 21.  Most likely due to demand ischemia.  Patient does not have chest pain. -Continue aspirin, pravastatin, metoprolol -Trend troponin -Check A1c, FLP -Repeat EKG in morning  GERD (gastroesophageal reflux disease) -Protonix  Chronic diastolic CHF (congestive heart failure) ([place]): 2D echo on 08/02/2016 showed EF 60-65%.  Patient does not have leg edema or JVD, but BNP is slightly elevated 382, chest x-ray showed questionable interstitial edema indicating patient is at risk of developing CHF exacerbation.  Given slightly worsening renal function, will not escalate the dose of diuretics. -Continue home Lasix 20 mg daily  CKD (chronic kidney disease), stage IIIa: Baseline creatinine 1.6-2.0.  His creatinine is 2.16, BUN 40, slightly worsening -Follow-up BMP  Atrial fibrillation, chronic (HCC) -Continue metoprolol and Eliquis  Acute metabolic encephalopathy: Resolved.  Currently back to baseline.  Patient is currently oriented x3.  No focal neurologic deficit on physical examination.  Likely due to hypoxia and pneumonia -Observe closely   DVT ppx: on eliquis Code Status: Full code per patient and his wife Family Communication: Yes, patient's wife at bed side Disposition Plan:  Anticipate discharge back to previous home environment Consults  called:  none Admission status:  progressive unit as inpt       Status is: Inpatient  Remains inpatient appropriate because:Inpatient level of care appropriate due to severity of illness   Dispo: The patient is from: Home              Anticipated d/c is to: Home              Anticipated d/c date is: 2 days              Patient currently is not medically stable to d/c.  Inpatient status:  # Patient requires inpatient status due to high intensity of service, high risk for further deterioration and high frequency of surveillance required.  I certify that at the point of admission it is my clinical judgment that the patient will require inpatient hospital care spanning beyond 2 midnights from the point of admission.  . This patient has multiple chronic comorbidities including hypertension, hyperlipidemia, diabetes mellitus, COPD, OSA on CPAP, GERD, depression with anxiety, CKD stage III, atrial fibrillation on Eliquis, CAD, dCHF . Now patient has presenting with acute on chronic respiratory failure with hypoxia due to possible CAP. Pt also has AMS and elevated trop. . The worrisome physical exam findings include AMS . The initial radiographic and laboratory data are worrisome because of elevated troponin, elevated BNP, chest x-ray showed right middle lobe and left base infiltration. . Current medical needs: please see my assessment and plan . Predictability of an adverse outcome (risk): Patient has multiple comorbidities as listed above. Now presents with acute on chronic respiratory failure with hypoxia due to possible CAP. Pt also has AMS and elevated trop. Patient's presentation is highly complicated.  Patient is at high risk of deteriorating.  Will need to be treated in hospital for at least 2 days.            Date of Service 09/27/2019    Ivor Costa [facility]  If 7PM-7AM, please contact night-coverage [email or web address] 09/27/2019, 1:54 PM

## 2019-09-27 NOTE — ED Notes (Signed)
First Nurse Note: Pt to ED from [facility]. Pt is having new onset AMS, fatigue, and loss of appetite. Pt wife states that he took too much insulin this morning for the amount of food that he ate. Pt is currently in NAD.

## 2019-09-27 NOTE — ED Triage Notes (Signed)
Pt to ED from [facility]. Pt states that he has had increased fatigue and shortness of breath since yesterday. Pt states that he is normally able to vacuum the house without having any issues. Pt states that he noticed yesterday that after vacuuming 1/2 a room that he felt very tired and short of breath. Pt has hx/o CHF and A. Fib, no swelling noted in LE. Pt denies any chest pain. Pt is able to speak in complete sentence and is A & O x 4.

## 2019-09-27 NOTE — ED Notes (Signed)
Pt O2 continues to drop to 88% and pt placed on 2L [place]

## 2019-09-27 NOTE — Progress Notes (Signed)
Updated patient's wife about patient's current condition. Patient is stable and resting in bed.

## 2019-09-27 NOTE — Plan of Care (Signed)
?  Problem: Activity: ?Goal: Capacity to carry out activities will improve ?Outcome: Progressing ?  ?Problem: Cardiac: ?Goal: Ability to achieve and maintain adequate cardiopulmonary perfusion will improve ?Outcome: Progressing ?  ?Problem: Health Behavior/Discharge Planning: ?Goal: Ability to manage health-related needs will improve ?Outcome: Progressing ?  ?

## 2019-09-27 NOTE — ED Notes (Signed)
Called lab to inquire about troponin- lab states the machine went down and results will be in ETA 15 minutes

## 2019-09-28 DIAGNOSIS — I2510 Atherosclerotic heart disease of native coronary artery without angina pectoris: Secondary | ICD-10-CM

## 2019-09-28 DIAGNOSIS — Z794 Long term (current) use of insulin: Secondary | ICD-10-CM

## 2019-09-28 DIAGNOSIS — I10 Essential (primary) hypertension: Secondary | ICD-10-CM

## 2019-09-28 DIAGNOSIS — N183 Chronic kidney disease, stage 3 unspecified: Secondary | ICD-10-CM

## 2019-09-28 DIAGNOSIS — E1122 Type 2 diabetes mellitus with diabetic chronic kidney disease: Secondary | ICD-10-CM

## 2019-09-28 DIAGNOSIS — I5032 Chronic diastolic (congestive) heart failure: Secondary | ICD-10-CM

## 2019-09-28 DIAGNOSIS — N1831 Chronic kidney disease, stage 3a: Secondary | ICD-10-CM

## 2019-09-28 LAB — GLUCOSE, CAPILLARY
Glucose-Capillary: 148 mg/dL — ABNORMAL HIGH (ref 70–99)
Glucose-Capillary: 150 mg/dL — ABNORMAL HIGH (ref 70–99)
Glucose-Capillary: 266 mg/dL — ABNORMAL HIGH (ref 70–99)
Glucose-Capillary: 308 mg/dL — ABNORMAL HIGH (ref 70–99)
Glucose-Capillary: 250 mg/dL — ABNORMAL HIGH (ref 70–99)

## 2019-09-28 LAB — LIPID PANEL
Cholesterol: 101 mg/dL (ref 0–200)
HDL: 28 mg/dL — ABNORMAL LOW (ref >40)
LDL Cholesterol: 60 mg/dL (ref 0–99)
Total CHOL/HDL Ratio: 3.6 RATIO
Triglycerides: 67 mg/dL (ref <150)
VLDL: 13 mg/dL (ref 0–40)

## 2019-09-28 LAB — HEMOGLOBIN A1C
Hgb A1c MFr Bld: 8.4 % — ABNORMAL HIGH (ref 4.8–5.6)
Mean Plasma Glucose: 194.38 mg/dL

## 2019-09-28 MED ORDER — PHENOL 1.4 % MT LIQD
1.0000 | OROMUCOSAL | Status: DC | PRN
  Administered 2019-09-28: 1 via OROMUCOSAL
  Filled 2019-09-28: qty 177

## 2019-09-28 MED ORDER — APIXABAN 5 MG PO TABS
5.0000 mg | ORAL_TABLET | Freq: Two times a day (BID) | ORAL | Status: DC
  Administered 2019-09-28 – 2019-10-02 (×8): 5 mg via ORAL
  Filled 2019-09-28 (×8): qty 1

## 2019-09-28 MED ORDER — AMLODIPINE BESYLATE 10 MG PO TABS
10.0000 mg | ORAL_TABLET | Freq: Every day | ORAL | Status: DC
  Administered 2019-09-29 – 2019-10-02 (×4): 10 mg via ORAL
  Filled 2019-09-28 (×4): qty 1

## 2019-09-28 MED ORDER — INSULIN GLARGINE 100 UNIT/ML ~~LOC~~ SOLN
35.0000 [IU] | Freq: Every day | SUBCUTANEOUS | Status: DC
  Administered 2019-09-28 – 2019-10-01 (×4): 35 [IU] via SUBCUTANEOUS
  Filled 2019-09-28 (×5): qty 0.35

## 2019-09-28 NOTE — Evaluation (Signed)
Occupational Therapy Evaluation Patient Details Name: Ernest Mclean MRN: [id number] DOB: 16-Apr-1941 Today's Date: 09/28/2019    History of Present Illness Ernest Mclean is a 79 y.o. male with medical history significant of hypertension, hyperlipidemia, diabetes mellitus, COPD, OSA on CPAP, GERD, depression with anxiety, CKD stage III, atrial fibrillation on Eliquis, CAD, dCHF, who presents with confusion and shortness of breath. Patient symptoms started yesterday, including intermittent confusion, generalized weakness, decreased oral intake, dry cough and shortness of breath. Patient was found to have oxygen desaturation to 88% on room air in ED.   Clinical Impression   Pt was seen for OT evaluation this date. Prior to hospital admission, pt was Indep with I/ADLs and fxl mobility. Pt lives with spouse in two story home with 1 small step to enter and is able to stay on main level. Currently pt demonstrates impairments as described below (See OT problem list) which functionally limit his ability to perform ADL/self-care tasks. Pt currently requires SBA/Supv to offer safety/energy conservation cues, but is otherwise able to perform self care and ADL mobility without AD or physical assistance.  Pt could benefit from strengthening and energy conservation aspect of OT while in acute setting, but do not anticipate need for OT f/u upon d/c.     Follow Up Recommendations  No OT follow up    Equipment Recommendations  None recommended by OT    Recommendations for Other Services       Precautions / Restrictions Precautions Precautions: Fall Restrictions Weight Bearing Restrictions: No      Mobility Bed Mobility Overal bed mobility: Modified Independent Bed Mobility: Supine to Sit;Sit to Supine     Supine to sit: Modified independent (Device/Increase time) Sit to supine: Modified independent (Device/Increase time)   General bed mobility comments: requiring extra time  Transfers Overall  transfer level: Modified independent               General transfer comment: SBA provided initially for safety, but pt demos good control and ultimately, no physical assist provided by therapist.    Balance Overall balance assessment: Needs assistance Sitting-balance support: Feet unsupported;No upper extremity supported Sitting balance-Leahy Scale: Good Sitting balance - Comments: Able to sit steady EOB   Standing balance support: During functional activity;No upper extremity supported Standing balance-Leahy Scale: Good Standing balance comment: Pt with very minmal posterior sway while standing sink-side to complete morning routine. Pt able to self correct and requires no physcial assist, but SBA provided for safety. no AD used.                           ADL either performed or assessed with clinical judgement   ADL                                         General ADL Comments: Pt required increased time to perform seated LB ADLs d/t dynamic reaching, but performs with no physical assist. Able to perform ADL transfers with SBA, standing ADLs sink-side with SBA. O2 monitored with standing self care (97% at rest) and pt de-sats to 88% on 2Lnc, tunred up to 3L during activity. Maintains sats >93% for remainder.     Vision Baseline Vision/History: Wears glasses Wears Glasses: At all times Patient Visual Report: No change from baseline       Perception     Praxis  Pertinent Vitals/Pain Pain Assessment: No/denies pain     Hand Dominance Left   Extremity/Trunk Assessment Upper Extremity Assessment Upper Extremity Assessment: Overall WFL for tasks assessed   Lower Extremity Assessment Lower Extremity Assessment: Overall WFL for tasks assessed   Cervical / Trunk Assessment Cervical / Trunk Assessment: Normal   Communication Communication Communication: No difficulties   Cognition Arousal/Alertness: Awake/alert Behavior During Therapy:  WFL for tasks assessed/performed Overall Cognitive Status: Within Functional Limits for tasks assessed                                 General Comments: generally pleasant and willingly participates   General Comments       Exercises Other Exercises Other Exercises: OT facilitates education re: EC techniques, but pt, d/t recent hospitalizations and familiarity with cardiac rehab, was moderately familiar with education provided. Good demo of appropriate understanding.   Shoulder Instructions      Home Living Family/patient expects to be discharged to:: Private residence Living Arrangements: Spouse/significant other Available Help at Discharge: Family Type of Home: House Home Access: Stairs to enter [employer] of Steps: 1 small step Entrance Stairs-Rails: None Home Layout: Two level;Able to live on main level with bedroom/bathroom Alternate Level Stairs-Number of Steps: 13 Alternate Level Stairs-Rails: Can reach both Bathroom Shower/Tub: Walk-in shower;Other (comment)(handicap accessible)   Bathroom Toilet: Handicapped height Bathroom Accessibility: Yes How Accessible: Accessible via wheelchair;Accessible via walker Home Equipment: [place] - single point   Additional Comments: Has cane but does not use      Prior Functioning/Environment Level of Independence: Independent        Comments: Limited to short household/community distances secondary to chronic leg pain, has treadmill and weights that he would lightly workout with intermittently (endoreses being more consistent at the [facility] before COVID/after cardiac rehab)        OT Problem List: Decreased strength;Decreased activity tolerance      OT Treatment/Interventions: Self-care/ADL training;Therapeutic exercise;DME and/or AE instruction;Therapeutic activities;Patient/family education;Balance training    OT Goals(Current goals can be found in the care plan section) Acute Rehab OT  Goals Patient Stated Goal: to go home OT Goal Formulation: With patient Time For Goal Achievement: 10/12/19 Potential to Achieve Goals: Good  OT Frequency: Min 1X/week   Barriers to D/C:            Co-evaluation              AM-PAC OT "6 Clicks" Daily Activity     Outcome Measure Help from another person eating meals?: None Help from another person taking care of personal grooming?: None Help from another person toileting, which includes using toliet, bedpan, or urinal?: None Help from another person bathing (including washing, rinsing, drying)?: A Little Help from another person to put on and taking off regular upper body clothing?: None Help from another person to put on and taking off regular lower body clothing?: None 6 Click Score: 23   End of Session Equipment Utilized During Treatment: Gait belt Nurse Communication: Mobility status  Activity Tolerance: Patient tolerated treatment well Patient left: Other (comment);with call bell/phone within reach;with family/visitor present(seated EOB)  OT Visit Diagnosis: Unsteadiness on feet (R26.81)                Time: [id number] OT Time Calculation (min): 32 min Charges:  OT General Charges $OT Visit: 1 Visit OT Evaluation $OT Eval Low Complexity: 1 Low OT Treatments $Therapeutic Activity: 8-22 mins  Gerrianne Scale, [place], OTR/L ascom [phone] 09/28/19, 4:58 PM

## 2019-09-28 NOTE — Progress Notes (Signed)
PROGRESS NOTE  Ernest Mclean [id number] DOB: January 28, 1941 DOA: 09/27/2019 PCP: Juluis Pitch, MD  Brief History   Ernest Mclean is a 79 y.o. male with medical history significant of hypertension, hyperlipidemia, diabetes mellitus, COPD, OSA on CPAP, GERD, depression with anxiety, CKD stage III, atrial fibrillation on Eliquis, CAD, dCHF, who presents with confusion and shortness of breath.  Patient symptoms are started yesterday, including intermittent confusion, generalized weakness, decreased oral intake, dry cough and shortness of breath. Patient was found to have oxygen desaturation to 88% on room air in ED, requiring 2 L nasal cannula oxygen with oxygen saturation 97%.  Patient does not have chest pain, fever or chills.  Patient has nausea, but no vomiting, diarrhea or abdominal pain.  No symptoms of UTI or unilateral weakness.  Patient's mental status has improved.  Currently patient is alert, oriented x3.  Mental status is back to baseline now.  In the ED the pt was found to have WBC 10.1, troponin 21, BNP 382, negative COVID-19 PCR, slightly worsened renal function, temperature normal, blood pressure 145/84, heart rate 57. CXR showed ill-defined airspace opacity right mid lung and left base. Suspect multifocal pneumonia, although patchy areas of pulmonary edema could present similarly.  Patient is admitted to progressive bed as inpatient.  The patient has been placed on IV Rocephin and Azithromycin, nebulizer treatments, an chloraseptic spray. Blood cultres x 2 have been obtained and have had no growth thus far. Consultants  . None  Procedures  . None  Antibiotics   Anti-infectives (From admission, onward)   Start     Dose/Rate Route Frequency Ordered Stop   09/27/19 1315  cefTRIAXone (ROCEPHIN) 2 g in sodium chloride 0.9 % 100 mL IVPB     2 g 200 mL/hr over 30 Minutes Intravenous Every 24 hours 09/27/19 1302     09/27/19 1315  azithromycin (ZITHROMAX) 500 mg in sodium chloride  0.9 % 250 mL IVPB     500 mg 250 mL/hr over 60 Minutes Intravenous Every 24 hours 09/27/19 1302      .  Subjective  The patient is resting comfortably. He states that he is feeling better, although he does complain of throat pain.  Objective   Vitals:  Vitals:   09/28/19 1117 09/28/19 1142  BP:  (!) 184/70  Pulse:  61  Resp:  17  Temp:  98.2 F (36.8 C)  SpO2: 95% 96%   Exam:  Constitutional:  . The patient is awake, alert, and oriented x 3. No acute distress. Respiratory:  . No increased work of breathing. Marland Kitchen Positive for wheezes diffusely . No rales or rhonchi auscultated . No tactile fremitus Cardiovascular:  . Regular rate and rhythm . No murmurs, ectopy, or gallups. . No lateral PMI. No thrills. Abdomen:  . Abdomen is soft, non-tender, non-distended . No hernias, masses, or organomegaly . Normoactive bowel sounds.  Musculoskeletal:  . No cyanosis, clubbing, or edema Skin:  . No rashes, lesions, ulcers . palpation of skin: no induration or nodules Neurologic:  . CN 2-12 intact . Sensation all 4 extremities intact Psychiatric:  . Mental status o Mood, affect appropriate o Orientation to person, place, time  . judgment and insight appear intact  I have personally reviewed the following:   Today's Data  . Vitals, lipid panel.  Micro Data  . Blood cultures x 2: No growth.  Scheduled Meds: . amLODipine  5 mg Oral Daily  . apixaban  5 mg Oral BID  . vitamin C  500 mg Oral Daily  . aspirin EC  81 mg Oral Daily  . citalopram  20 mg Oral QHS  . dextromethorphan-guaiFENesin  1 tablet Oral BID  . furosemide  20 mg Oral Daily  . insulin aspart  0-9 Units Subcutaneous Q4H  . insulin glargine  30 Units Subcutaneous QHS  . ipratropium  2.5 mL Inhalation Q4H  . lisinopril  5 mg Oral Daily  . metoprolol tartrate  100 mg Oral BID  . mometasone-formoterol  2 puff Inhalation BID  . pantoprazole  40 mg Oral Daily  . pravastatin  20 mg Oral QPM  . pregabalin   25 mg Oral BID  . sodium chloride flush  10 mL Intravenous Q12H   Continuous Infusions: . azithromycin 500 mg (09/27/19 1356)  . cefTRIAXone (ROCEPHIN)  IV 2 g (09/28/19 1220)    Principal Problem:   CAP (community acquired pneumonia) Active Problems:   OSA on CPAP   COPD (chronic obstructive pulmonary disease) (HCC)   Type II diabetes mellitus with renal manifestations (HCC)   HTN (hypertension)   CAD (coronary artery disease)   GERD (gastroesophageal reflux disease)   Chronic diastolic CHF (congestive heart failure) (HCC)   CKD (chronic kidney disease), stage IIIa   Atrial fibrillation, chronic (HCC)   Acute on chronic respiratory failure with hypoxia (HCC)   Elevated troponin   Acute metabolic encephalopathy   LOS: 1 day   A & P  Acute on chronic respiratory failure with hypoxia due to CAP (community acquired pneumonia): The patient has been placed on IV Rocephin and Azithromycin, nebulizer treatments, mucolytics, and chloraseptic spray. Blood cultres x 2 have been obtained and have had no growth thus far.Urine legionella and S. pneumococcal antigen are pending.  OSA: CPAP continued as at home. However CPAP in hospital is not humidified which has resulted in a dry throat for the patient.  COPD (chronic obstructive pulmonary disease) ([place]): No wheezing rhonchi on auscultation. Continue bronchodilators.  Type II diabetes mellitus with renal manifestations ([place]): Most recent A1c 7.9, poorly controled. Patient is taking Metformin, Humalog, Lantus at home. Lantus dose was decreased from 60 to 30 units daily and the patient is receiving FSBS and SSI to follow his sugars.  HTN: Blood pressures are running high on home doses of amlodipine, lisinopril, and metoprolol. Will increase the dose of amlodipine to address elevated pressures. As needed hydralazine was available.  CAD (coronary artery disease) and elevated trop: trop minimally elevated at 21.  Most likely due to demand  ischemia.  Patient does not have chest pain. Will continue aspirin, pravastatin, metoprolol as at home.   Atrial Fib/Flutter this am: Rate is controlled. Continue metoprolol and eliquis.  GERD (gastroesophageal reflux disease): Protonix  Chronic diastolic CHF (congestive heart failure) ([place]): 2D echo on 08/02/2016 showed EF 60-65%.  Patient does not have leg edema or JVD, but BNP is slightly elevated 382, chest x-ray showed questionable interstitial edema indicating patient is at risk of developing CHF exacerbation.  Given slightly worsening renal function, will not escalate the dose of diuretics. Continue home Lasix 20 mg daily.  CKD (chronic kidney disease), stage IIIa: Baseline creatinine 1.6-2.0.  His creatinine is 2.16, BUN 40, slightly worsening. Avoid nephrotoxic medications.   Atrial fibrillation, chronic ([place]): Continue metoprolol and Eliquis  Acute metabolic encephalopathy: Resolved.  Currently back to baseline.  Patient is currently oriented x3.  No focal neurologic deficit on physical examination.  Likely due to hypoxia and pneumonia. Monitor.  I have seen and examined  this patient myself. I have spent 34 minutes in his evaluation and care.   DVT ppx: on eliquis Code Status: Full code per patient and his wife Family Communication: None available. Disposition Plan:  Admitted from home. Anticipate discharge to home. Barriers to discharge include acute illness and respiratory distress, pneumonia requiring IV antibiotics.  Adedamola Seto, DO [facility] Direct contact: see [email or web address]  7PM-7AM contact night coverage as above 09/28/2019, 1:13 PM  LOS: 1 day

## 2019-09-28 NOTE — Progress Notes (Signed)
PT Cancellation Note  Patient Details Name: Ernest Mclean MRN: [id number] DOB: 04/02/1941   Cancelled Treatment:    Reason Eval/Treat Not Completed: Other (comment)(RN requests re-attempt in 1-2 hours as pt was just started on antibiotics.) Will re-attempt later today.   Madilyn Hook 09/28/2019, 12:56 PM

## 2019-09-28 NOTE — Evaluation (Signed)
Physical Therapy Evaluation Patient Details Name: Ernest Mclean MRN: [id number] DOB: January 22, 1941 Today's Date: 09/28/2019   History of Present Illness  Ernest Mclean is a 79 y.o. male with medical history significant of hypertension, hyperlipidemia, diabetes mellitus, COPD, OSA on CPAP, GERD, depression with anxiety, CKD stage III, atrial fibrillation on Eliquis, CAD, dCHF, who presents with confusion and shortness of breath. Patient symptoms started yesterday, including intermittent confusion, generalized weakness, decreased oral intake, dry cough and shortness of breath. Patient was found to have oxygen desaturation to 88% on room air in ED.  Clinical Impression  Pt is a pleasant 79 year old M who was admitted for CAP and above diagnoses. Pt performs bed mobility with mod I (requiring extra time), transfers mod I for extra time, and ambulation with RW with supervision (with 2L O2 via [place] donned throughout). Pt ambulates 260' with RW and supervision and desaturates slightly to 91%, requiring 3 standing rest breaks and pursed lip breathing throughout to ensure adequate O2 saturation; with brief seated rest break following gait trial, O2 sat increases to 94% (see objective for further detail). Pt's baseline functional level is short household/community distances without AD, limited by chronic leg pain reportedly, utilizing O2 PRN. Pt appears to be at baseline with exception of increased need for O2 to ensure saturation >92%. Recommend DC to home with use of rollator, and will continue to follow up during acute stay for further education in energy conservation techniques. Pt demonstrates deficits with activity tolerance requiring continued skilled PT intervention.     Follow Up Recommendations No PT follow up    Equipment Recommendations  Rolling walker with 5" wheels;Other (comment)(recommend rollator (with seat) so pt may utilize seated rest breaks during functional activities)    Recommendations for  Other Services       Precautions / Restrictions Precautions Precautions: Fall Restrictions Weight Bearing Restrictions: No      Mobility  Bed Mobility Overal bed mobility: Modified Independent Bed Mobility: Supine to Sit;Sit to Supine     Supine to sit: Modified independent (Device/Increase time) Sit to supine: Modified independent (Device/Increase time)   General bed mobility comments: requiring extra time  Transfers Overall transfer level: Modified independent               General transfer comment: Transfers fairly easily from sitting edge of bed without AD  Ambulation/Gait Ambulation/Gait assistance: Supervision Gait Distance (Feet): 260 Feet Assistive device: Rolling walker (2 wheeled) Gait Pattern/deviations: WFL(Within Functional Limits)     General Gait Details: With 3 standing rest breaks and instruction in pursed lip breathing throughout to ensure O2>92%; pulse oximeter not reading well at times, taking rest breaks out of abdundance of caution (with 2LO O2 via [place])  Stairs            Wheelchair Mobility    Modified Rankin (Stroke Patients Only)       Balance Overall balance assessment: Needs assistance Sitting-balance support: Feet unsupported;No upper extremity supported Sitting balance-Leahy Scale: Good Sitting balance - Comments: Able to sit steady EOB   Standing balance support: During functional activity;No upper extremity supported Standing balance-Leahy Scale: Good Standing balance comment: Able to perform turns in standing in-room without UE support; RW utilized during gait as precaution due to reports of occasional loss of balance and pt's tendency to desat with activity.  Pertinent Vitals/Pain Pain Assessment: No/denies pain    Home Living Family/patient expects to be discharged to:: Private residence Living Arrangements: Spouse/significant other Available Help at Discharge: Family Type  of Home: House Home Access: Stairs to enter Entrance Stairs-Rails: None Entrance Stairs-Number of Steps: 1 small step Home Layout: Two level;Able to live on main level with bedroom/bathroom Home Equipment: Cane - single point Additional Comments: Has cane but does not use    Prior Function Level of Independence: Independent         Comments: Limited to short household/community distances secondary to chronic leg pain     Hand Dominance        Extremity/Trunk Assessment   Upper Extremity Assessment Upper Extremity Assessment: Overall WFL for tasks assessed    Lower Extremity Assessment Lower Extremity Assessment: Overall WFL for tasks assessed    Cervical / Trunk Assessment Cervical / Trunk Assessment: Normal  Communication   Communication: No difficulties  Cognition Arousal/Alertness: Awake/alert Behavior During Therapy: WFL for tasks assessed/performed Overall Cognitive Status: Within Functional Limits for tasks assessed                                 General Comments: generally pleasant and willingly participates      General Comments      Exercises     Assessment/Plan    PT Assessment Patient needs continued PT services  PT Problem List Decreased mobility;Decreased safety awareness;Decreased activity tolerance;Decreased balance       PT Treatment Interventions      PT Goals (Current goals can be found in the Care Plan section)  Acute Rehab PT Goals Patient Stated Goal: to go home PT Goal Formulation: With patient/family Time For Goal Achievement: 10/12/19 Potential to Achieve Goals: Good    Frequency Min 2X/week   Barriers to discharge        Co-evaluation               AM-PAC PT "6 Clicks" Mobility  Outcome Measure Help needed turning from your back to your side while in a flat bed without using bedrails?: None Help needed moving from lying on your back to sitting on the side of a flat bed without using bedrails?:  None Help needed moving to and from a bed to a chair (including a wheelchair)?: None Help needed standing up from a chair using your arms (e.g., wheelchair or bedside chair)?: None Help needed to walk in hospital room?: A Little Help needed climbing 3-5 steps with a railing? : A Little 6 Click Score: 22    End of Session Equipment Utilized During Treatment: Gait belt;Oxygen Activity Tolerance: Patient tolerated treatment well Patient left: in bed;with call bell/phone within reach;with family/visitor present Nurse Communication: Mobility status PT Visit Diagnosis: Difficulty in walking, not elsewhere classified (R26.2);Unsteadiness on feet (R26.81)    Time: 1338-1410 PT Time Calculation (min) (ACUTE ONLY): 32 min   Charges:   PT Evaluation $PT Eval Moderate Complexity: 1 Mod PT Treatments $Gait Training: 8-22 mins        Petra Kuba, PT, DPT 09/28/19, 2:33 PM

## 2019-09-29 ENCOUNTER — Telehealth: Payer: Self-pay | Admitting: Internal Medicine

## 2019-09-29 LAB — GLUCOSE, CAPILLARY
Glucose-Capillary: 111 mg/dL — ABNORMAL HIGH (ref 70–99)
Glucose-Capillary: 163 mg/dL — ABNORMAL HIGH (ref 70–99)
Glucose-Capillary: 189 mg/dL — ABNORMAL HIGH (ref 70–99)
Glucose-Capillary: 231 mg/dL — ABNORMAL HIGH (ref 70–99)
Glucose-Capillary: 266 mg/dL — ABNORMAL HIGH (ref 70–99)
Glucose-Capillary: 268 mg/dL — ABNORMAL HIGH (ref 70–99)
Glucose-Capillary: 296 mg/dL — ABNORMAL HIGH (ref 70–99)

## 2019-09-29 MED ORDER — SODIUM CHLORIDE 0.9 % IV SOLN
INTRAVENOUS | Status: DC | PRN
  Administered 2019-09-29: 250 mL via INTRAVENOUS

## 2019-09-29 MED ORDER — IPRATROPIUM BROMIDE 0.02 % IN SOLN
2.5000 mL | Freq: Four times a day (QID) | RESPIRATORY_TRACT | Status: DC
  Administered 2019-09-29: 0.5 mg via RESPIRATORY_TRACT
  Filled 2019-09-29: qty 2.5

## 2019-09-29 MED ORDER — METOPROLOL TARTRATE 50 MG PO TABS
50.0000 mg | ORAL_TABLET | Freq: Two times a day (BID) | ORAL | Status: DC
  Administered 2019-09-29 – 2019-10-02 (×6): 50 mg via ORAL
  Filled 2019-09-29 (×6): qty 1

## 2019-09-29 NOTE — Consult Note (Signed)
Cardiology Consultation Note    Patient ID: Ernest Mclean, MRN: [id number], DOB/AGE: 1940-08-27 79 y.o. Admit date: 09/27/2019   Date of Consult: 09/29/2019 Primary Physician: Juluis Pitch, MD Primary Cardiologist: Dr. Clayborn Bigness  Chief Complaint: sob Reason for Consultation: bradycardia Requesting MD: Dr. Benny Lennert  HPI: Ernest Mclean is a 79 y.o. male with history of hypertension, hyperlipidemia, diabetes, COPD, organic sleep apnea, chronic atrial fibrillation, CKD 3, CAD, HFpEF admitted with confusion and shortness of breath.  Chest x-ray revealed probable pneumonia.  Patient is in A. fib with slow ventricular response.  Has been on amlodipine 5 mg daily, Eliquis 5 mg twice daily, enteric-coated aspirin, furosemide, Prinivil 5 mg daily, metoprolol 100 mg twice daily.  Chest x-ray on presentation showed ill-defined airspace opacity in the right midlung and left base consistent with multifocal pneumonia.  No CHF.  No chest pain.  Troponins drawn per protocol are flat 20 or less.  BNP 382.  Was acute on chronic renal insufficiency.  Telemetry reveals occasional episodes of A. fib with slow ventricular response in the upper 30s low 40s.  Patient is asymptomatic with this.  Past Medical History:  Diagnosis Date  . Atrial fibrillation ([place])   . Basal cell carcinoma 06/2017   Left Ear  . CAD (coronary artery disease)   . CKD (chronic kidney disease)   . Congestive heart failure ([place])   . COPD (chronic obstructive pulmonary disease) ([place])   . Diabetes ([place])   . Hypertension   . OSA on CPAP   . Squamous carcinoma 06/2017   head and nose      Surgical History:  Past Surgical History:  Procedure Laterality Date  . CLAVICLE SURGERY    . heart bypass    . HERNIA REPAIR    . MOHS SURGERY  07/10/2017   Head     Home Meds: Prior to Admission medications   Medication Sig Start Date End Date Taking? Authorizing Provider  albuterol (PROVENTIL HFA;VENTOLIN HFA) 108 (90 Base) MCG/ACT inhaler  Inhale 2 puffs into the lungs every 4 (four) hours as needed for wheezing or shortness of breath. 02/18/18  Yes Kasa, Maretta Bees, MD  amLODipine (NORVASC) 5 MG tablet Take 5 mg by mouth daily.  08/17/14  Yes [provider]  apixaban (ELIQUIS) 5 MG TABS tablet Take 2.5 mg by mouth 2 (two) times daily.    Yes [provider]  aspirin EC 81 MG tablet Take 81 mg by mouth daily. 08/17/14  Yes [provider]  citalopram (CELEXA) 20 MG tablet Take 20 mg by mouth at bedtime.  07/30/14  Yes [provider]  CRANBERRY PO Take 820 mg by mouth daily.   Yes [provider]  cyanocobalamin 500 MCG tablet Take 500 mcg by mouth daily.   Yes [provider]  diazepam (VALIUM) 5 MG tablet Take 5 mg by mouth every 12 (twelve) hours as needed for anxiety.  10/24/13  Yes [provider]  ergocalciferol (VITAMIN D2) 50000 units capsule Take 50,000 Units by mouth every 30 (thirty) days.   Yes [provider]  furosemide (LASIX) 20 MG tablet Take 20 mg by mouth daily.    Yes [provider]  HUMALOG 100 UNIT/ML injection Inject 10-25 Units into the skin 3 (three) times daily with meals. (sliding scale as needed to a maximum of 150u daily) 06/24/19  Yes [provider]  insulin glargine (LANTUS) 100 UNIT/ML injection Inject 60 Units into the skin at bedtime.  Yes [provider]  lisinopril (PRINIVIL,ZESTRIL) 5 MG tablet Take 5 mg by mouth daily.   Yes [provider]  metFORMIN (GLUCOPHAGE-XR) 500 MG 24 hr tablet Take 1,000 mg by mouth daily with supper.   Yes [provider]  metoprolol (LOPRESSOR) 100 MG tablet Take 100 mg by mouth 2 (two) times daily. 06/17/14  Yes [provider]  OMEGA-3 FATTY ACIDS-VITAMIN E PO Take 1 capsule by mouth daily.   Yes [provider]  omeprazole (PRILOSEC) 20 MG capsule Take 20 mg by mouth daily.   Yes [provider]  pravastatin (PRAVACHOL) 20 MG tablet  Take 20 mg by mouth every evening.   Yes [provider]  pregabalin (LYRICA) 25 MG capsule Take 25 mg by mouth 2 (two) times daily.   Yes [provider]  tiotropium (SPIRIVA) 18 MCG inhalation capsule Place 18 mcg into inhaler and inhale daily. 07/31/14  Yes [provider]  vitamin C (ASCORBIC ACID) 500 MG tablet Take 500 mg by mouth daily.   Yes [provider]  WIXELA INHUB 500-50 MCG/DOSE AEPB USE 1 INHALATION BY MOUTH TWICE DAILY Patient taking differently: Inhale 1 puff into the lungs 2 (two) times daily.  04/16/19  Yes Ernest Lipps, MD    Inpatient Medications:  . amLODipine  10 mg Oral Daily  . apixaban  5 mg Oral BID  . vitamin C  500 mg Oral Daily  . aspirin EC  81 mg Oral Daily  . citalopram  20 mg Oral QHS  . dextromethorphan-guaiFENesin  1 tablet Oral BID  . furosemide  20 mg Oral Daily  . insulin aspart  0-9 Units Subcutaneous Q4H  . insulin glargine  35 Units Subcutaneous QHS  . ipratropium  2.5 mL Inhalation Q4H  . lisinopril  5 mg Oral Daily  . mometasone-formoterol  2 puff Inhalation BID  . pantoprazole  40 mg Oral Daily  . pravastatin  20 mg Oral QPM  . pregabalin  25 mg Oral BID  . sodium chloride flush  10 mL Intravenous Q12H   . sodium chloride Stopped (09/29/19 1309)  . azithromycin 500 mg (09/29/19 1344)  . cefTRIAXone (ROCEPHIN)  IV 200 mL/hr at 09/29/19 1311    Allergies:  Allergies  Allergen Reactions  . Codeine Hives, Rash and Swelling  . Garlic Nausea And Vomiting  . Hydralazine Other (See Comments)    tongue swollen and couldn't wake up ---- not positive it was this or a mix of this with something else or high sugar  . Penicillins Other (See Comments)    Passed out (at 79 yrs old) Has patient had a PCN reaction causing immediate rash, facial/tongue/throat swelling, SOB or lightheadedness with hypotension: No Has patient had a PCN reaction causing severe rash involving mucus membranes or skin necrosis: No Has  patient had a PCN reaction that required hospitalization No Has patient had a PCN reaction occurring within the last 10 years: No If all of the above answers are "NO", then may proceed with Cephalosporin use.     Social History   Socioeconomic History  . Marital status: Married    Spouse name: Not on file  . Number of children: Not on file  . Years of education: Not on file  . Highest education level: Not on file  Occupational History  . Not on file  Tobacco Use  . Smoking status: Former Smoker    Packs/day: 1.00    Years: 30.00    Pack years:  30.00    Types: Cigarettes    Quit date: 12/14/2001    Years since quitting: 17.8  . Smokeless tobacco: Never Used  . Tobacco comment: quit 12/14/2001  Substance and Sexual Activity  . Alcohol use: No    Alcohol/week: 0.0 standard drinks  . Drug use: No  . Sexual activity: Not on file  Other Topics Concern  . Not on file  Social History Narrative  . Not on file   Social Determinants of Health   Financial Resource Strain:   . Difficulty of Paying Living Expenses:   Food Insecurity:   . Worried About Charity fundraiser in the Last Year:   . Arboriculturist in the Last Year:   Transportation Needs:   . Film/video editor (Medical):   Marland Kitchen Lack of Transportation (Non-Medical):   Physical Activity:   . Days of Exercise per Week:   . Minutes of Exercise per Session:   Stress:   . Feeling of Stress :   Social Connections:   . Frequency of Communication with Friends and Family:   . Frequency of Social Gatherings with Friends and Family:   . Attends Religious Services:   . Active Member of Clubs or Organizations:   . Attends Archivist Meetings:   Marland Kitchen Marital Status:   Intimate Partner Violence:   . Fear of Current or Ex-Partner:   . Emotionally Abused:   Marland Kitchen Physically Abused:   . Sexually Abused:      Family History  Problem Relation Age of Onset  . Stroke Other   . Diabetes Other   . Breast cancer Other   .  Colon cancer Other      Review of Systems: A 12-system review of systems was performed and is negative except as noted in the HPI.  Labs: No results for input(s): CKTOTAL, CKMB, TROPONINI in the last 72 hours. Lab Results  Component Value Date   WBC 10.1 09/27/2019   HGB 11.6 (L) 09/27/2019   HCT 36.9 (L) 09/27/2019   MCV 86.2 09/27/2019   PLT 153 09/27/2019    Recent Labs  Lab 09/27/19 1043  NA 137  K 4.8  CL 106  CO2 22  BUN 40*  CREATININE 2.16*  CALCIUM 8.6*  GLUCOSE 94   Lab Results  Component Value Date   CHOL 101 09/28/2019   HDL 28 (L) 09/28/2019   LDLCALC 60 09/28/2019   TRIG 67 09/28/2019   No results found for: DDIMER  Radiology/Studies:  DG Chest 2 View  Result Date: 09/27/2019 CLINICAL DATA:  Shortness of breath EXAM: CHEST - 2 VIEW COMPARISON:  June 06, 2019 FINDINGS: There is ill-defined airspace opacity in the right mid lung as well as in the left base. Lungs elsewhere are clear. Heart is upper normal in size with pulmonary vascularity normal. Patient is status post median sternotomy. No adenopathy. No bone lesions. IMPRESSION: Ill-defined airspace opacity right mid lung and left base. Suspect multifocal pneumonia, although patchy areas of pulmonary edema could present similarly. Heart upper normal in size. No adenopathy evident. Electronically Signed   By: Lowella Grip III M.D.   On: 09/27/2019 11:17    Wt Readings from Last 3 Encounters:  09/29/19 102.3 kg  07/15/19 101.6 kg  07/01/19 103.6 kg    EKG: afib with slow vr  Physical Exam:  Blood pressure (!) 158/62, pulse (!) 46, temperature 98.3 F (36.8 C), temperature source Oral, resp. rate 16, height 6' (1.829 m), weight  102.3 kg, SpO2 97 %. Body mass index is 30.58 kg/m. General: Well developed, well nourished, in no acute distress. Head: Normocephalic, atraumatic, sclera non-icteric, no xanthomas, nares are without discharge.  Neck: Negative for carotid bruits. JVD not  elevated. Lungs: Decreased breath sounds Heart: irr, irr Abdomen: Soft, non-tender, non-distended with normoactive bowel sounds. No hepatomegaly. No rebound/guarding. No obvious abdominal masses. Msk:  Strength and tone appear normal for age. Extremities: No clubbing or cyanosis. No edema.  Distal pedal pulses are 2+ and equal bilaterally. Neuro: Alert and oriented X 3. No facial asymmetry. No focal deficit. Moves all extremities spontaneously. Psych:  Responds to questions appropriately with a normal affect.     Assessment and Plan  79 y.o. male with history of hypertension, hyperlipidemia, diabetes, COPD, organic sleep apnea, chronic atrial fibrillation, CKD 3, CAD, HFpEF admitted with confusion and shortness of breath.  1.  Atrial fibrillation-chronic anticoagulated with Eliquis.  Currently somewhat bradycardic on 100 twice daily of metoprolol.  Will reduce this to 50 mg daily.  Patient is asymptomatic.  Want to protect against rebound tachycardia.  Will follow rate while in the hospital on telemetry  2.  Airspace disease-continue with antibiotics  3.  Coronary artery disease-currently stable.  He is ruled out for myocardial infarction. Signed, Teodoro Spray MD 09/29/2019, 4:37 PM Pager: [phone]

## 2019-09-29 NOTE — Progress Notes (Signed)
Physical Therapy Treatment Patient Details Name: Ernest Mclean MRN: [id number] DOB: 09-28-1940 Today's Date: 09/29/2019    History of Present Illness PRINTESS Ernest Mclean is a 79 y.o. male with medical history significant of hypertension, hyperlipidemia, diabetes mellitus, COPD, OSA on CPAP, GERD, depression with anxiety, CKD stage III, atrial fibrillation on Eliquis, CAD, dCHF, who presents with confusion and shortness of breath. Patient symptoms started yesterday, including intermittent confusion, generalized weakness, decreased oral intake, dry cough and shortness of breath. Patient was found to have oxygen desaturation to 88% on room air in ED.    PT Comments    Pt is making good progress towards goals with ability to ambulate around RN station using RW and 2L of O2 donned. O2 sats WNL. Improved endurance without rest break needed. Continue to progress as able. Pt wishing to ambulate more frequently. Encouraged to reach out to nursing to assist with this task.    Follow Up Recommendations  No PT follow up     Equipment Recommendations  Rolling walker with 5" wheels;Other (comment)    Recommendations for Other Services       Precautions / Restrictions Precautions Precautions: Fall Restrictions Weight Bearing Restrictions: No    Mobility  Bed Mobility Overal bed mobility: Independent Bed Mobility: Supine to Sit;Sit to Supine     Supine to sit: Independent Sit to supine: Independent   General bed mobility comments: safe technique  Transfers Overall transfer level: Modified independent Equipment used: Rolling walker (2 wheeled)             General transfer comment: safe technique with pushing from seated surface. All mobility performed on 2L of O2.  Ambulation/Gait Ambulation/Gait assistance: Supervision Gait Distance (Feet): 200 Feet Assistive device: Rolling walker (2 wheeled) Gait Pattern/deviations: WFL(Within Functional Limits)     General Gait Details: safe  technique without rest breaks this date. O2 sats maintained at 90% and quickly improve to 93% with resting. Slight wheezing noted after exertion.   Stairs             Wheelchair Mobility    Modified Rankin (Stroke Patients Only)       Balance Overall balance assessment: Needs assistance Sitting-balance support: Feet unsupported;No upper extremity supported Sitting balance-Leahy Scale: Good Sitting balance - Comments: Able to sit steady EOB   Standing balance support: During functional activity;No upper extremity supported Standing balance-Leahy Scale: Good                              Cognition Arousal/Alertness: Awake/alert Behavior During Therapy: WFL for tasks assessed/performed Overall Cognitive Status: Within Functional Limits for tasks assessed                                 General Comments: generally pleasant and willingly participates      Exercises      General Comments        Pertinent Vitals/Pain Pain Assessment: No/denies pain    Home Living                      Prior Function            PT Goals (current goals can now be found in the care plan section) Acute Rehab PT Goals Patient Stated Goal: to go home PT Goal Formulation: With patient/family Time For Goal Achievement: 10/12/19 Potential to Achieve Goals: Good  Progress towards PT goals: Progressing toward goals    Frequency    Min 2X/week      PT Plan Current plan remains appropriate    Co-evaluation              AM-PAC PT "6 Clicks" Mobility   Outcome Measure  Help needed turning from your back to your side while in a flat bed without using bedrails?: None Help needed moving from lying on your back to sitting on the side of a flat bed without using bedrails?: None Help needed moving to and from a bed to a chair (including a wheelchair)?: None Help needed standing up from a chair using your arms (e.g., wheelchair or bedside  chair)?: None Help needed to walk in hospital room?: A Little Help needed climbing 3-5 steps with a railing? : A Little 6 Click Score: 22    End of Session Equipment Utilized During Treatment: Gait belt;Oxygen Activity Tolerance: Patient tolerated treatment well Patient left: in bed;with call bell/phone within reach;with family/visitor present Nurse Communication: Mobility status PT Visit Diagnosis: Difficulty in walking, not elsewhere classified (R26.2);Unsteadiness on feet (R26.81)     Time: [id number] PT Time Calculation (min) (ACUTE ONLY): 16 min  Charges:  $Gait Training: 8-22 mins                     Greggory Stallion, [place], DPT [phone]    Wanetta Funderburke 09/29/2019, 4:38 PM

## 2019-09-29 NOTE — Telephone Encounter (Signed)
Noted will route to Dr. [name]

## 2019-09-29 NOTE — Progress Notes (Addendum)
Patient has been sinus brady, sinus rhythm on heart monitor. CCMD called to inform that has gone down as low as 39 briefly and will go right back up to the 50s and 60s. Patient asymptomatic. Dr. Benny Lennert notified. No new orders.  Update 1715: Patient's heart rate going down to 39 more frequently then going back up to the high 50s. Patient still asymptomatic, no complaints. MD notified again. Cardiology consult put in.

## 2019-09-29 NOTE — Progress Notes (Addendum)
PROGRESS NOTE  SIG CLISHAM [id number] DOB: 1940-11-18 DOA: 09/27/2019 PCP: Juluis Pitch, MD  Brief History   Ernest Mclean is a 79 y.o. male with medical history significant of hypertension, hyperlipidemia, diabetes mellitus, COPD, OSA on CPAP, GERD, depression with anxiety, CKD stage III, atrial fibrillation on Eliquis, CAD, dCHF, who presents with confusion and shortness of breath.   Patient symptoms are started yesterday, including intermittent confusion, generalized weakness, decreased oral intake, dry cough and shortness of breath. Patient was found to have oxygen desaturation to 88% on room air in ED, requiring 2 L nasal cannula oxygen with oxygen saturation 97%.  Patient does not have chest pain, fever or chills.  Patient has nausea, but no vomiting, diarrhea or abdominal pain.  No symptoms of UTI or unilateral weakness.  Patient's mental status has improved.  Currently patient is alert, oriented x3.  Mental status is back to baseline now.   In the ED the pt was found to have WBC 10.1, troponin 21, BNP 382, negative COVID-19 PCR, slightly worsened renal function, temperature normal, blood pressure 145/84, heart rate 57. CXR showed ill-defined airspace opacity right mid lung and left base. Suspect multifocal pneumonia, although patchy areas of pulmonary edema could present similarly.  Patient is admitted to progressive bed as inpatient.  The patient has been placed on IV Rocephin and Azithromycin, nebulizer treatments, an chloraseptic spray. Blood cultres x 2 have been obtained and have had no growth thus far.  This afternoon the patient's heart rate has dropped into the 30's repeatedly. His wife refuses to allow [place] to hole beta blockers. Cardiology has been consulted. Consultants  Cardiology  Procedures  None  Antibiotics   Anti-infectives (From admission, onward)    Start     Dose/Rate Route Frequency Ordered Stop   09/27/19 1315  cefTRIAXone (ROCEPHIN) 2 g in sodium chloride  0.9 % 100 mL IVPB     2 g 200 mL/hr over 30 Minutes Intravenous Every 24 hours 09/27/19 1302     09/27/19 1315  azithromycin (ZITHROMAX) 500 mg in sodium chloride 0.9 % 250 mL IVPB     500 mg 250 mL/hr over 60 Minutes Intravenous Every 24 hours 09/27/19 1302        Subjective  The patient is resting comfortably. He has had repeated episodes of bradycardia. He is complaining of a sore throat.  Objective   Vitals:  Vitals:   09/29/19 1633 09/29/19 1729  BP: (!) 158/62   Pulse: (!) 46   Resp:    Temp:    SpO2:  96%   Exam:  Constitutional:  The patient is awake, alert, and oriented x 3. No acute distress. Respiratory:  No increased work of breathing. Positive for wheezes diffusely No rales or rhonchi auscultated No tactile fremitus Cardiovascular:  Regular rate and rhythm No murmurs, ectopy, or gallups. No lateral PMI. No thrills. Abdomen:  Abdomen is soft, non-tender, non-distended No hernias, masses, or organomegaly Normoactive bowel sounds.  Musculoskeletal:  No cyanosis, clubbing, or edema Skin:  No rashes, lesions, ulcers palpation of skin: no induration or nodules Neurologic:  CN 2-12 intact Sensation all 4 extremities intact Psychiatric:  Mental status Mood, affect appropriate Orientation to person, place, time  judgment and insight appear intact  I have personally reviewed the following:   Today's Data  Vitals  Micro Data  Blood cultures x 2: No growth.  Scheduled Meds:  amLODipine  10 mg Oral Daily   apixaban  5 mg Oral BID  vitamin C  500 mg Oral Daily   aspirin EC  81 mg Oral Daily   citalopram  20 mg Oral QHS   dextromethorphan-guaiFENesin  1 tablet Oral BID   furosemide  20 mg Oral Daily   insulin aspart  0-9 Units Subcutaneous Q4H   insulin glargine  35 Units Subcutaneous QHS   ipratropium  2.5 mL Inhalation Q4H   lisinopril  5 mg Oral Daily   metoprolol tartrate  50 mg Oral BID   mometasone-formoterol  2 puff Inhalation BID    pantoprazole  40 mg Oral Daily   pravastatin  20 mg Oral QPM   pregabalin  25 mg Oral BID   sodium chloride flush  10 mL Intravenous Q12H   Continuous Infusions:  sodium chloride Stopped (09/29/19 1309)   azithromycin 500 mg (09/29/19 1344)   cefTRIAXone (ROCEPHIN)  IV 200 mL/hr at 09/29/19 1311    Principal Problem:   CAP (community acquired pneumonia) Active Problems:   OSA on CPAP   COPD (chronic obstructive pulmonary disease) (HCC)   Type II diabetes mellitus with renal manifestations (HCC)   HTN (hypertension)   CAD (coronary artery disease)   GERD (gastroesophageal reflux disease)   Chronic diastolic CHF (congestive heart failure) (HCC)   CKD (chronic kidney disease), stage IIIa   Atrial fibrillation, chronic (HCC)   Acute on chronic respiratory failure with hypoxia (HCC)   Elevated troponin   Acute metabolic encephalopathy   LOS: 2 days   A & P  Acute on chronic respiratory failure with hypoxia due to CAP (community acquired pneumonia): The patient has been placed on IV Rocephin and Azithromycin, nebulizer treatments, mucolytics, and chloraseptic spray. Blood cultres x 2 have been obtained and have had no growth thus far.Urine legionella and S. pneumococcal antigen are pending.  OSA: CPAP continued as at home. However CPAP in hospital is not humidified which has resulted in a dry throat for the patient.  Bradycardia: Pt is on metoprolol 100 mg bid. The patient's wife refused to allow this medication to be held. Cardiology has been consulted.  Metabolic encephlopathy: Resolved.   COPD (chronic obstructive pulmonary disease) ([place]): No wheezing rhonchi on auscultation. Continue bronchodilators.   Type II diabetes mellitus with renal manifestations ([place]): Most recent A1c 7.9, poorly controled. Patient is taking Metformin, Humalog, Lantus at home. Lantus dose was decreased from 60 to 30 units daily and the patient is receiving FSBS and SSI to follow his sugars.   HTN:  Blood pressures are running high on home doses of amlodipine, lisinopril, and metoprolol. Will increase the dose of amlodipine to address elevated pressures. As needed hydralazine was available.   CAD (coronary artery disease) and elevated trop: trop minimally elevated at 21.  Most likely due to demand ischemia.  Patient does not have chest pain. Will continue aspirin, pravastatin, metoprolol as at home.   Atrial Fib/Flutter this am: Rate is controlled. Continue metoprolol and eliquis.   GERD (gastroesophageal reflux disease): Protonix   Chronic diastolic CHF (congestive heart failure) ([place]): 2D echo on 08/02/2016 showed EF 60-65%.  Patient does not have leg edema or JVD, but BNP is slightly elevated 382, chest x-ray showed questionable interstitial edema indicating patient is at risk of developing CHF exacerbation.  Given slightly worsening renal function, will not escalate the dose of diuretics. Continue home Lasix 20 mg daily.   CKD (chronic kidney disease), stage IIIa: Baseline creatinine 1.6-2.0.  His creatinine is 2.16, BUN 40, slightly worsening. Avoid nephrotoxic medications.  Atrial fibrillation, chronic ([place]): Continue metoprolol and Eliquis   Acute metabolic encephalopathy: Resolved.  Unable to determine cause. Currently back to baseline.  Patient is currently oriented x3.  No focal neurologic deficit on physical examination.  Likely due to hypoxia and pneumonia. Monitor.   I have seen and examined this patient myself. I have spent 32 minutes in his evaluation and care.    DVT ppx: on eliquis Code Status: Full code per patient and his wife Family Communication: None available. Disposition Plan:  Admitted from home. Anticipate discharge to home. Barriers to discharge include acute illness, bradycardia, and respiratory distress, pneumonia requiring IV antibiotics.  Kikue Gerhart, DO [facility] Direct contact: see [email or web address]  7PM-7AM contact night coverage as  above 09/29/2019, 5:50 PM  LOS: 1 day

## 2019-09-30 LAB — BASIC METABOLIC PANEL
Anion gap: 9 (ref 5–15)
BUN: 46 mg/dL — ABNORMAL HIGH (ref 8–23)
CO2: 23 mmol/L (ref 22–32)
Calcium: 8.9 mg/dL (ref 8.9–10.3)
Chloride: 105 mmol/L (ref 98–111)
Creatinine, Ser: 1.89 mg/dL — ABNORMAL HIGH (ref 0.61–1.24)
GFR calc Af Amer: 39 mL/min — ABNORMAL LOW (ref >60)
GFR calc non Af Amer: 33 mL/min — ABNORMAL LOW (ref >60)
Glucose, Bld: 168 mg/dL — ABNORMAL HIGH (ref 70–99)
Potassium: 4.7 mmol/L (ref 3.5–5.1)
Sodium: 137 mmol/L (ref 135–145)

## 2019-09-30 LAB — CBC WITH DIFFERENTIAL/PLATELET
Abs Immature Granulocytes: 0.06 10*3/uL (ref 0.00–0.07)
Basophils Absolute: 0 10*3/uL (ref 0.0–0.1)
Basophils Relative: 0 %
Eosinophils Absolute: 0.3 10*3/uL (ref 0.0–0.5)
Eosinophils Relative: 4 %
HCT: 33.9 % — ABNORMAL LOW (ref 39.0–52.0)
Hemoglobin: 10.7 g/dL — ABNORMAL LOW (ref 13.0–17.0)
Immature Granulocytes: 1 %
Lymphocytes Relative: 10 %
Lymphs Abs: 0.9 10*3/uL (ref 0.7–4.0)
MCH: 26.9 pg (ref 26.0–34.0)
MCHC: 31.6 g/dL (ref 30.0–36.0)
MCV: 85.2 fL (ref 80.0–100.0)
Monocytes Absolute: 0.9 10*3/uL (ref 0.1–1.0)
Monocytes Relative: 10 %
Neutro Abs: 7.2 10*3/uL (ref 1.7–7.7)
Neutrophils Relative %: 75 %
Platelets: 185 10*3/uL (ref 150–400)
RBC: 3.98 MIL/uL — ABNORMAL LOW (ref 4.22–5.81)
RDW: 15.4 % (ref 11.5–15.5)
WBC: 9.5 10*3/uL (ref 4.0–10.5)
nRBC: 0 % (ref 0.0–0.2)

## 2019-09-30 LAB — GLUCOSE, CAPILLARY
Glucose-Capillary: 147 mg/dL — ABNORMAL HIGH (ref 70–99)
Glucose-Capillary: 140 mg/dL — ABNORMAL HIGH (ref 70–99)
Glucose-Capillary: 272 mg/dL — ABNORMAL HIGH (ref 70–99)
Glucose-Capillary: 241 mg/dL — ABNORMAL HIGH (ref 70–99)
Glucose-Capillary: 179 mg/dL — ABNORMAL HIGH (ref 70–99)

## 2019-09-30 MED ORDER — IPRATROPIUM BROMIDE 0.02 % IN SOLN
2.5000 mL | Freq: Three times a day (TID) | RESPIRATORY_TRACT | Status: DC
  Administered 2019-09-30 – 2019-10-01 (×4): 0.5 mg via RESPIRATORY_TRACT
  Filled 2019-09-30 (×4): qty 2.5

## 2019-09-30 MED ORDER — POLYETHYLENE GLYCOL 3350 17 G PO PACK
17.0000 g | PACK | Freq: Every day | ORAL | Status: DC | PRN
  Administered 2019-09-30: 17 g via ORAL
  Filled 2019-09-30: qty 1

## 2019-09-30 MED ORDER — INSULIN ASPART 100 UNIT/ML ~~LOC~~ SOLN: 0.0000 [IU] | Freq: Every day | SUBCUTANEOUS | Status: DC

## 2019-09-30 MED ORDER — BISACODYL 5 MG PO TBEC: 5.0000 mg | DELAYED_RELEASE_TABLET | Freq: Every day | ORAL | Status: DC | PRN

## 2019-09-30 MED ORDER — INSULIN ASPART 100 UNIT/ML ~~LOC~~ SOLN
6.0000 [IU] | Freq: Three times a day (TID) | SUBCUTANEOUS | Status: DC
  Administered 2019-09-30 – 2019-10-02 (×5): 6 [IU] via SUBCUTANEOUS
  Filled 2019-09-30 (×5): qty 1

## 2019-09-30 MED ORDER — INSULIN ASPART 100 UNIT/ML ~~LOC~~ SOLN
0.0000 [IU] | Freq: Three times a day (TID) | SUBCUTANEOUS | Status: DC
  Administered 2019-09-30: 3 [IU] via SUBCUTANEOUS
  Administered 2019-10-01: 2 [IU] via SUBCUTANEOUS
  Administered 2019-10-01: 3 [IU] via SUBCUTANEOUS
  Administered 2019-10-01: 5 [IU] via SUBCUTANEOUS
  Filled 2019-09-30 (×4): qty 1

## 2019-09-30 NOTE — Progress Notes (Signed)
Patient briefly took off oxygen to blow nose and oxygen saturation dropped to 85%.  put back on 3L and now oxygen saturation 95%.

## 2019-09-30 NOTE — Progress Notes (Signed)
Patient Name: Ernest Mclean Date of Encounter: 09/30/2019  Hospital Problem List     Principal Problem:   CAP (community acquired pneumonia) Active Problems:   OSA on CPAP   COPD (chronic obstructive pulmonary disease) (HCC)   Type II diabetes mellitus with renal manifestations (HCC)   HTN (hypertension)   CAD (coronary artery disease)   GERD (gastroesophageal reflux disease)   Chronic diastolic CHF (congestive heart failure) (HCC)   CKD (chronic kidney disease), stage IIIa   Atrial fibrillation, chronic (HCC)   Acute on chronic respiratory failure with hypoxia (HCC)   Elevated troponin   Acute metabolic encephalopathy    Patient Profile     79 y.o. male with history of hypertension, hyperlipidemia, diabetes, COPD, organic sleep apnea, chronic atrial fibrillation, CKD 3, CAD, HFpEF admitted with confusion and shortness of breath.  Chest x-ray revealed probable pneumonia.  Patient is in A. fib with slow ventricular response.  Has been on amlodipine 5 mg daily, Eliquis 5 mg twice daily, enteric-coated aspirin, furosemide, Prinivil 5 mg daily, metoprolol 100 mg twice daily.  Chest x-ray on presentation showed ill-defined airspace opacity in the right midlung and left base consistent with multifocal pneumonia.  No CHF.  No chest pain.  Troponins drawn per protocol are flat 20 or less.  BNP 382.  Was acute on chronic renal insufficiency.  Telemetry reveals occasional episodes of A. fib with slow ventricular response in the upper 30s low 40s.  Patient is asymptomatic with this.   Subjective   Feels better   Inpatient Medications    . amLODipine  10 mg Oral Daily  . apixaban  5 mg Oral BID  . vitamin C  500 mg Oral Daily  . aspirin EC  81 mg Oral Daily  . citalopram  20 mg Oral QHS  . dextromethorphan-guaiFENesin  1 tablet Oral BID  . furosemide  20 mg Oral Daily  . insulin aspart  0-9 Units Subcutaneous Q4H  . insulin glargine  35 Units Subcutaneous QHS  . ipratropium  2.5 mL  Inhalation TID  . lisinopril  5 mg Oral Daily  . metoprolol tartrate  50 mg Oral BID  . mometasone-formoterol  2 puff Inhalation BID  . pantoprazole  40 mg Oral Daily  . pravastatin  20 mg Oral QPM  . pregabalin  25 mg Oral BID  . sodium chloride flush  10 mL Intravenous Q12H    Vital Signs    Vitals:   09/29/19 2012 09/29/19 2157 09/30/19 0458 09/30/19 0744  BP:   (!) 164/69 (!) 156/59  Pulse:   (!) 57 (!) 56  Resp:  (!) 24 19 17   Temp:   97.6 F (36.4 C) 98.4 F (36.9 C)  TempSrc:   Oral Oral  SpO2: 98%  94% 96%  Weight:   101.6 kg   Height:        Intake/Output Summary (Last 24 hours) at 09/30/2019 0823 Last data filed at 09/29/2019 2218 Gross per 24 hour  Intake 443.29 ml  Output 250 ml  Net 193.29 ml   Filed Weights   09/28/19 0239 09/29/19 0411 09/30/19 0458  Weight: 101.2 kg 102.3 kg 101.6 kg    Physical Exam    GEN: Well nourished, well developed, in no acute distress.  HEENT: normal.  Neck: Supple, no JVD, carotid bruits, or masses. Cardiac: Irr, Irr rhythm Respiratory:  Respirations regular and unlabored. Decrease breath sounds at bases.  GI: Soft, nontender, nondistended, BS + x 4. MS:  no deformity or atrophy. Skin: warm and dry, no rash. Neuro:  Strength and sensation are intact. Psych: Normal affect.  Labs    CBC Recent Labs    09/27/19 1043 09/30/19 0443  WBC 10.1 9.5  NEUTROABS 7.6 7.2  HGB 11.6* 10.7*  HCT 36.9* 33.9*  MCV 86.2 85.2  PLT 153 [id number]   Basic Metabolic Panel Recent Labs    09/27/19 1043 09/30/19 0443  NA 137 137  K 4.8 4.7  CL 106 105  CO2 22 23  GLUCOSE 94 168*  BUN 40* 46*  CREATININE 2.16* 1.89*  CALCIUM 8.6* 8.9   Liver Function Tests No results for input(s): AST, ALT, ALKPHOS, BILITOT, PROT, ALBUMIN in the last 72 hours. No results for input(s): LIPASE, AMYLASE in the last 72 hours. Cardiac Enzymes No results for input(s): CKTOTAL, CKMB, CKMBINDEX, TROPONINI in the last 72 hours. BNP Recent Labs     09/27/19 1043  BNP 382.0*   D-Dimer No results for input(s): DDIMER in the last 72 hours. Hemoglobin A1C Recent Labs    09/28/19 0429  HGBA1C 8.4*   Fasting Lipid Panel Recent Labs    09/28/19 0429  CHOL 101  HDL 28*  LDLCALC 60  TRIG 67  CHOLHDL 3.6   Thyroid Function Tests No results for input(s): TSH, T4TOTAL, T3FREE, THYROIDAB in the last 72 hours.  Invalid input(s): FREET3  Telemetry    afib with controlled vr  ECG    afib with slow vr  Radiology    DG Chest 2 View  Result Date: 09/27/2019 CLINICAL DATA:  Shortness of breath EXAM: CHEST - 2 VIEW COMPARISON:  June 06, 2019 FINDINGS: There is ill-defined airspace opacity in the right mid lung as well as in the left base. Lungs elsewhere are clear. Heart is upper normal in size with pulmonary vascularity normal. Patient is status post median sternotomy. No adenopathy. No bone lesions. IMPRESSION: Ill-defined airspace opacity right mid lung and left base. Suspect multifocal pneumonia, although patchy areas of pulmonary edema could present similarly. Heart upper normal in size. No adenopathy evident. Electronically Signed   By: Lowella Grip III M.D.   On: 09/27/2019 11:17    Assessment & Plan    79 y.o. male with history of hypertension, hyperlipidemia, diabetes, COPD, organic sleep apnea, chronic atrial fibrillation, CKD 3, CAD, HFpEF admitted with confusion and shortness of breath.  1.  Atrial fibrillation-chronic anticoagulated with Eliquis.  Currently somewhat bradycardic on 100 twice daily of metoprolol. Rate has improved with metoprolol 50 bid. No significant episodes of bradycardia or pauses. Will remain on this dose for now.   2.  Airspace disease-continue with antibiotics  3.  Coronary artery disease-currently stable.  He is ruled out for myocardial infarction.  Signed, Javier Docker Sonji Starkes MD 09/30/2019, 8:23 AM  Pager: [phone]

## 2019-09-30 NOTE — Progress Notes (Signed)
PROGRESS NOTE    Ernest Mclean   [id number]  DOB: 29-Aug-1940  PCP: Juluis Pitch, MD    DOA: 09/27/2019 LOS: 3   Brief Narrative   Ernest Mclean is a 79 y.o. male with medical history significant of hypertension, hyperlipidemia, diabetes mellitus, COPD, OSA on CPAP, GERD, depression with anxiety, CKD stage III, atrial fibrillation on Eliquis, CAD, dCHF, who presented to the ED on 09/27/19 with confusion and shortness of breath.  Associated symptoms reported include generalized weakness, decreased oral intake, dry cough and shortness of breath. In the ED, O2 sat was 88% on room air, improved to 97% on 2 L/min oxygen, HR 57, BP 145/84.  Chest xray showed ill-defined airspace opacity right mid lung and left base. Suspect multifocal pneumonia, although patchy areas of pulmonary edema could present similarly.  He was treated with IV Rocephin and Zithromax and admitted to hospitalist service for management of community-acquired pneumonia.   Patient has history of a-fib and developed was having very slow ventricular response, HR as low as upper 20's in the ED according to patient's wife.  Cardiology was consulted.  Patient's Toprol dose was decreased and HR's have been upper 40's to 70's.  At baseline, he uses 2 L/min oxygen at night only.  Currently using 2 L/min continuous.  Attempting to wean to baseline, but patient may require continuous home oxygen for short time while recovering from pneumonia.     Assessment & Plan   Principal Problem:   CAP (community acquired pneumonia) Active Problems:   OSA on CPAP   COPD (chronic obstructive pulmonary disease) (HCC)   Type II diabetes mellitus with renal manifestations (HCC)   HTN (hypertension)   CAD (coronary artery disease)   GERD (gastroesophageal reflux disease)   Chronic diastolic CHF (congestive heart failure) (HCC)   CKD (chronic kidney disease), stage IIIa   Atrial fibrillation, chronic (HCC)   Acute on chronic respiratory failure  with hypoxia (HCC)   Elevated troponin   Acute metabolic encephalopathy  Acute on chronic respiratory failure with hypoxiadue toCommunity Acquired Pneumonia:  --continue IV Rocephin and Azithromycin --nebulizer treatments, mucolytics, chloraseptic spray.  --follow blood cultres (no growth to date) --supplemental O2, maintain sat >= 90%  OSA: CPAP continued as at home.   Bradycardia due to A-fib with slow ventricular response:  Improved with reduction in metoprolol to 50 mg BID. HR's upper 40's to 70's. --telemetry  Chronic Atrial Fib: Rate is better as above.  He goes into A-fib in his O2 sat drops.  --Continue metoprolol and Eliquis  Acute metabolic encephalopathy:Resolved. Currently back to baseline. Likely due to hypoxia and pneumonia. Monitor.  COPD (chronic obstructive pulmonary disease):Not acutely exacerbated, no wheezing on exam. Continue bronchodilators.  Type II diabetes mellitus with renal manifestations:Most recent A1c7.9, poorly controled. Home regimen isMetformin, Humalog, Lantus.  --Lantus 35 units at bedtime --Novolog 6 units TID WC --Sliding scale Novolog --adjust insulin as needed   Hypertension: Blood pressures had run high on home doses of amlodipine, lisinopril, and metoprolol. Amlodipine was increased.  As needed hydralazine was available.  CAD (coronary artery disease): Elevated troponin:trop minimally elevated at 21. Most likely due to demand ischemia in the setting of hypoxia.  Patient without chest pain.   --continue home aspirin, pravastatin, metoprolol   GERD: continue Protonix  Chronic diastolic CHF - 2D echo on 08/02/2016 showed EF 60-65%. Euvolemic.  Was given small dose Lasix on admission due to BNP is slightly elevated 382, chest x-ray showed questionable interstitial edema.  Continue home dose diuretics.  Monitor volume status.  CKD (chronic kidney disease), stage IIIa:Baseline creatinine 1.6-2.0. Stable.  Avoid  nephrotoxic agents and hypotension, renally dose meds as indicated. --monitor BMP    Patient BMI: Body mass index is 30.37 kg/m.   DVT prophylaxis: on Eliquis  Diet:  Diet Orders (From admission, onward)    Start     Ordered   09/27/19 1549  Diet heart healthy/carb modified Room service appropriate? Yes; Fluid consistency: Thin  Diet effective now    Question Answer Comment  Diet-HS Snack? Nothing   Room service appropriate? Yes   Fluid consistency: Thin      09/27/19 1548            Code Status: Full Code    Subjective 09/30/19    Patient seen at bedside this AM.  No acute events reported.  He reports feeling better today.  Still feels weak and fatigued, but improving.  Confirms use of 2 L/min nocturnal oxygen only.  He hopes to not need continuous oxygen.  Denies fevers/chills, chest pain, n/v/d or other acute complaints.   Disposition Plan & Communication   Status is: Inpatient  Remains inpatient appropriate because:IV treatments appropriate due to intensity of illness or inability to take PO and current oxygen requirements remain above baseline and weaning down.   Dispo: The patient is from: Home              Anticipated d/c is to: Home              Anticipated d/c date is: 2 days              Patient currently is not medically stable to d/c.   Family Communication: Spoke with wife, [name], by phone this afternoon as she was not at bedside on rounds.  She is updated and agreeable with the plan.    Consults, Procedures, Significant Events   Consultants:   Cardiology  Procedures:   None  Antimicrobials:   Rocephin  Azithromyin   Objective   Vitals:   09/30/19 0826 09/30/19 0840 09/30/19 1143 09/30/19 1510  BP:   (!) 172/69 112/63  Pulse:  62 (!) 53 (!) 59  Resp:   17 18  Temp:   98 F (36.7 C) 98.3 F (36.8 C)  TempSrc:   Oral Oral  SpO2: 90%  96% 96%  Weight:      Height:        Intake/Output Summary (Last 24 hours) at 09/30/2019  1623 Last data filed at 09/30/2019 1345 Gross per 24 hour  Intake 490 ml  Output 250 ml  Net 240 ml   Filed Weights   09/28/19 0239 09/29/19 0411 09/30/19 0458  Weight: 101.2 kg 102.3 kg 101.6 kg    Physical Exam:  General exam: awake, alert, no acute distress HEENT: moist mucus membranes, hearing grossly normal  Respiratory system: left lung clear to auscultation, right base and mid lung zone with intermittent crackle, no wheezes or rhonchi, normal respiratory effort, on 2 L/min nasal cannula oxygen. Cardiovascular system: normal S1/S2, RRR, no JVD, no pedal edema.   Central nervous system: A&O x4. no gross focal neurologic deficits, normal speech Extremities: moves all, no cyanosis, normal tone Skin: dry, intact, normal temperature, normal color Psychiatry: normal mood, congruent affect, judgement and insight appear normal  Labs   Data Reviewed: I have personally reviewed following labs and imaging studies  CBC: Recent Labs  Lab 09/27/19 1043 09/30/19 0443  WBC 10.1  9.5  NEUTROABS 7.6 7.2  HGB 11.6* 10.7*  HCT 36.9* 33.9*  MCV 86.2 85.2  PLT 153 [id number]   Basic Metabolic Panel: Recent Labs  Lab 09/27/19 1043 09/30/19 0443  NA 137 137  K 4.8 4.7  CL 106 105  CO2 22 23  GLUCOSE 94 168*  BUN 40* 46*  CREATININE 2.16* 1.89*  CALCIUM 8.6* 8.9   GFR: Estimated Creatinine Clearance: 40.4 mL/min (A) (by C-G formula based on SCr of 1.89 mg/dL (H)). Liver Function Tests: No results for input(s): AST, ALT, ALKPHOS, BILITOT, PROT, ALBUMIN in the last 168 hours. No results for input(s): LIPASE, AMYLASE in the last 168 hours. No results for input(s): AMMONIA in the last 168 hours. Coagulation Profile: No results for input(s): INR, PROTIME in the last 168 hours. Cardiac Enzymes: No results for input(s): CKTOTAL, CKMB, CKMBINDEX, TROPONINI in the last 168 hours. BNP (last 3 results) No results for input(s): PROBNP in the last 8760 hours. HbA1C: Recent Labs     09/28/19 0429  HGBA1C 8.4*   CBG: Recent Labs  Lab 09/29/19 2033 09/29/19 2357 09/30/19 0452 09/30/19 0743 09/30/19 1142  GLUCAP 266* 268* 147* 140* 272*   Lipid Profile: Recent Labs    09/28/19 0429  CHOL 101  HDL 28*  LDLCALC 60  TRIG 67  CHOLHDL 3.6   Thyroid Function Tests: No results for input(s): TSH, T4TOTAL, FREET4, T3FREE, THYROIDAB in the last 72 hours. Anemia Panel: No results for input(s): VITAMINB12, FOLATE, FERRITIN, TIBC, IRON, RETICCTPCT in the last 72 hours. Sepsis Labs: No results for input(s): PROCALCITON, LATICACIDVEN in the last 168 hours.  Recent Results (from the past 240 hour(s))  Blood Culture (routine x 2)     Status: None (Preliminary result)   Collection Time: 09/27/19 10:19 AM   Specimen: BLOOD  Result Value Ref Range Status   Specimen Description BLOOD R H  Final   Special Requests   Final    BOTTLES DRAWN AEROBIC AND ANAEROBIC Blood Culture results may not be optimal due to an inadequate volume of blood received in culture bottles   Culture   Final    NO GROWTH 3 DAYS Performed at [facility], [address] [place], [place] [address]    Report Status PENDING  Incomplete  Blood Culture (routine x 2)     Status: None (Preliminary result)   Collection Time: 09/27/19 10:19 AM   Specimen: BLOOD  Result Value Ref Range Status   Specimen Description BLOOD LAC  Final   Special Requests   Final    BOTTLES DRAWN AEROBIC AND ANAEROBIC Blood Culture results may not be optimal due to an excessive volume of blood received in culture bottles   Culture   Final    NO GROWTH 3 DAYS Performed at [facility], [address] [place], [place] [address]    Report Status PENDING  Incomplete  Respiratory Panel by RT PCR (Flu A&B, Covid) - Nasopharyngeal Swab     Status: None   Collection Time: 09/27/19 12:06 PM   Specimen: Nasopharyngeal Swab  Result Value Ref Range Status   SARS Coronavirus 2 by RT PCR NEGATIVE NEGATIVE Final     Comment: (NOTE) SARS-CoV-2 target nucleic acids are NOT DETECTED. The SARS-CoV-2 RNA is generally detectable in upper respiratoy specimens during the acute phase of infection. The lowest concentration of SARS-CoV-2 viral copies this assay can detect is 131 copies/mL. A negative result does not preclude SARS-Cov-2 infection and should not be used as  the sole basis for treatment or other patient management decisions. A negative result may occur with  improper specimen collection/handling, submission of specimen other than nasopharyngeal swab, presence of viral mutation(s) within the areas targeted by this assay, and inadequate number of viral copies (<131 copies/mL). A negative result must be combined with clinical observations, patient history, and epidemiological information. The expected result is Negative. Fact Sheet for Patients:  PinkCheek.be Fact Sheet for Healthcare Providers:  GravelBags.it This test is not yet ap proved or cleared by the [place] FDA and  has been authorized for detection and/or diagnosis of SARS-CoV-2 by FDA under an Emergency Use Authorization (EUA). This EUA will remain  in effect (meaning this test can be used) for the duration of the COVID-19 declaration under Section 564(b)(1) of the Act, 21 U.S.C. section 360bbb-3(b)(1), unless the authorization is terminated or revoked sooner.    Influenza A by PCR NEGATIVE NEGATIVE Final   Influenza B by PCR NEGATIVE NEGATIVE Final    Comment: (NOTE) The Xpert Xpress SARS-CoV-2/FLU/RSV assay is intended as an aid in  the diagnosis of influenza from Nasopharyngeal swab specimens and  should not be used as a sole basis for treatment. Nasal washings and  aspirates are unacceptable for Xpert Xpress SARS-CoV-2/FLU/RSV  testing. Fact Sheet for Patients: PinkCheek.be Fact Sheet for Healthcare  Providers: GravelBags.it This test is not yet approved or cleared by the [place] FDA and  has been authorized for detection and/or diagnosis of SARS-CoV-2 by  FDA under an Emergency Use Authorization (EUA). This EUA will remain  in effect (meaning this test can be used) for the duration of the  Covid-19 declaration under Section 564(b)(1) of the Act, 21  U.S.C. section 360bbb-3(b)(1), unless the authorization is  terminated or revoked. Performed at [facility], [address] [place], [place] [address]       Imaging Studies   No results found.   Medications   Scheduled Meds: . amLODipine  10 mg Oral Daily  . apixaban  5 mg Oral BID  . vitamin C  500 mg Oral Daily  . aspirin EC  81 mg Oral Daily  . citalopram  20 mg Oral QHS  . dextromethorphan-guaiFENesin  1 tablet Oral BID  . furosemide  20 mg Oral Daily  . insulin aspart  0-5 Units Subcutaneous QHS  . insulin aspart  0-9 Units Subcutaneous TID WC  . insulin aspart  6 Units Subcutaneous TID WC  . insulin glargine  35 Units Subcutaneous QHS  . ipratropium  2.5 mL Inhalation TID  . lisinopril  5 mg Oral Daily  . metoprolol tartrate  50 mg Oral BID  . mometasone-formoterol  2 puff Inhalation BID  . pantoprazole  40 mg Oral Daily  . pravastatin  20 mg Oral QPM  . pregabalin  25 mg Oral BID  . sodium chloride flush  10 mL Intravenous Q12H   Continuous Infusions: . sodium chloride 10 mL/hr at 09/30/19 1301  . azithromycin 500 mg (09/30/19 1358)  . cefTRIAXone (ROCEPHIN)  IV 2 g (09/30/19 1302)       LOS: 3 days    Time spent: 30 minutes    Ezekiel Slocumb, DO [facility]  09/30/2019, 4:23 PM    If 7PM-7AM, please contact night-coverage. How to contact the [facility] Attending or Consulting provider [place] or covering provider during after hours [place], for this patient?    1. Check the care team in [facility] and look for a)  attending/consulting [facility] provider listed  and b) the [facility] team listed 2. Log into [email or web address] and use [place]'s universal password to access. If you do not have the password, please contact the hospital operator. 3. Locate the [facility] provider you are looking for under [facility] and page to a number that you can be directly reached. 4. If you still have difficulty reaching the provider, please page the [facility] (Director on Call) for the Hospitalists listed on amion for assistance.

## 2019-09-30 NOTE — Hospital Course (Signed)
Ernest Mclean is a 79 y.o. male with medical history significant of hypertension, hyperlipidemia, diabetes mellitus, COPD, OSA on CPAP, GERD, depression with anxiety, CKD stage III, atrial fibrillation on Eliquis, CAD, dCHF, who presented to the ED on 09/27/19 with confusion and shortness of breath.  Associated symptoms reported include generalized weakness, decreased oral intake, dry cough and shortness of breath. In the ED, O2 sat was 88% on room air, improved to 97% on 2 L/min oxygen, HR 57, BP 145/84.  Chest xray showed ill-defined airspace opacity right mid lung and left base. Suspect multifocal pneumonia, although patchy areas of pulmonary edema could present similarly.  He was treated with IV Rocephin and Zithromax and admitted to hospitalist service for management of community-acquired pneumonia.   Patient has history of a-fib and developed was having very slow ventricular response, HR as low as upper 20's in the ED according to patient's wife.  Cardiology was consulted.  Patient's Toprol dose was decreased and HR's have been upper 40's to 70's.  At baseline, he uses 2 L/min oxygen at night only.  Currently using 2 L/min continuous.  Attempting to wean to baseline, but patient may require continuous home oxygen for short time while recovering from pneumonia.

## 2019-09-30 NOTE — Telephone Encounter (Signed)
Thank you :)

## 2019-09-30 NOTE — Care Management Important Message (Signed)
Important Message  Patient Details  Name: Ernest Mclean MRN: [id number] Date of Birth: Aug 20, 1940   Medicare Important Message Given:  Yes     Dannette Barbara 09/30/2019, 11:12 AM

## 2019-09-30 NOTE — Plan of Care (Signed)
Patient being treated for pneumonia. There have been no acute events over night. The patient has been alert and oriented x4. His vitals have been stable. He has been maintaining his oxygenation on 2L via nasal cannula. He remains on continuous pulse oximetry monitoring. Compliant with use of CPAP at bedtime. PIV dressing changed. Blood glucose monitored. Sliding scale insulin administered per order. Compliant with medication therapies. He denied pain. No prn medications administered. Safety precautions in place. Patient free of falls and injuries. Plan of care continued.   Problem: Education: Goal: Ability to demonstrate management of disease process will improve Outcome: Progressing Goal: Ability to verbalize understanding of medication therapies will improve Outcome: Progressing Goal: Individualized Educational Video(s) Outcome: Progressing   Problem: Activity: Goal: Capacity to carry out activities will improve Outcome: Progressing   Problem: Cardiac: Goal: Ability to achieve and maintain adequate cardiopulmonary perfusion will improve Outcome: Progressing   Problem: Education: Goal: Knowledge of General Education information will improve Description: Including pain rating scale, medication(s)/side effects and non-pharmacologic comfort measures Outcome: Progressing   Problem: Health Behavior/Discharge Planning: Goal: Ability to manage health-related needs will improve Outcome: Progressing   Problem: Clinical Measurements: Goal: Ability to maintain clinical measurements within normal limits will improve Outcome: Progressing Goal: Will remain free from infection Outcome: Progressing Goal: Diagnostic test results will improve Outcome: Progressing Goal: Respiratory complications will improve Outcome: Progressing Goal: Cardiovascular complication will be avoided Outcome: Progressing   Problem: Activity: Goal: Risk for activity intolerance will decrease Outcome: Progressing    Problem: Nutrition: Goal: Adequate nutrition will be maintained Outcome: Progressing   Problem: Coping: Goal: Level of anxiety will decrease Outcome: Progressing   Problem: Elimination: Goal: Will not experience complications related to bowel motility Outcome: Progressing Goal: Will not experience complications related to urinary retention Outcome: Progressing   Problem: Pain Managment: Goal: General experience of comfort will improve Outcome: Progressing   Problem: Safety: Goal: Ability to remain free from injury will improve Outcome: Progressing   Problem: Skin Integrity: Goal: Risk for impaired skin integrity will decrease Outcome: Progressing

## 2019-09-30 NOTE — Progress Notes (Signed)
Inpatient Diabetes Program Recommendations  AACE/ADA: New Consensus Statement on Inpatient Glycemic Control (2015)  Target Ranges:  Prepandial:   less than 140 mg/dL      Peak postprandial:   less than 180 mg/dL (1-2 hours)      Critically ill patients:  140 - 180 mg/dL   Results for Ernest Mclean, Ernest Mclean (MRN [id number]) as of 09/30/2019 09:30  Ref. Range 09/28/2019 23:36 09/29/2019 04:08 09/29/2019 07:53 09/29/2019 11:34 09/29/2019 16:23 09/29/2019 20:33  Glucose-Capillary Latest Ref Range: 70 - 99 mg/dL 189 (H)  2 units NOVOLOG  163 (H)  2 units NOVOLOG  111 (H) 231 (H)  3 units NOVOLOG  296 (H)  5 units NOVOLOG  266 (H)  5 units NOVOLOG +  35 units LANTUS   Results for Ernest Mclean, Ernest Mclean (MRN [id number]) as of 09/30/2019 09:30  Ref. Range 09/29/2019 23:57 09/30/2019 04:52 09/30/2019 07:43  Glucose-Capillary Latest Ref Range: 70 - 99 mg/dL 268 (H)  5 units NOVOLOG  147 (H)  1 unit NOVOLOG  140 (H)  1 unit [place] with: Acute on chronic respiratory failure with hypoxia due to CAP   History: DM, COPD, CKD, CHF  Home DM Meds: Humalog 10-25 units TID       Lantus 60 units QHS       Metformin 1000 mg QPM  Current Orders: Lantus 35 units QHS      Novolog Sensitive Correction Scale/ SSI (0-9 units) Q4 hours   Endocrinologist: Dr. Lucilla Lame with [facility] seen 07/22/2019--Was told to take the following: --Continue taking metformin 1000 mg daily --Continue taking Lantus 60 units at bedtime Adjust the Humalog before meals to: 10 units before breakfast  25 units before lunch  32 units before supper  If sugar less than 80, take HALF the dose.  If planning exercise, reduce by 2 units.   If sugar over 150 before the meal then: Add 2 units of Humalog if your sugar is 150-200 Add 4 units of Humalog if your sugar is 201-250 Add 6 units of Humalog if your sugar is 251-300 Add 8 units of Humalog if your sugar is 301-350 Add 10 units of Humalog if your sugar is over 350        MD- Please consider the following in-hospital insulin adjustments:  1. Change timing of the Novolog SSi to TID AC + HS (currently allowed PO diet)  2. Start Novolog Meal Coverage: Novolog 6 units TID with meals (portion of pt's home dose)     --Will follow patient during hospitalization--  Wyn Quaker RN, MSN, CDE Diabetes Coordinator Inpatient Glycemic Control Team Team Pager: [phone] (8a-5p)

## 2019-10-01 LAB — BASIC METABOLIC PANEL
Anion gap: 7 (ref 5–15)
BUN: 41 mg/dL — ABNORMAL HIGH (ref 8–23)
CO2: 26 mmol/L (ref 22–32)
Calcium: 8.8 mg/dL — ABNORMAL LOW (ref 8.9–10.3)
Chloride: 103 mmol/L (ref 98–111)
Creatinine, Ser: 1.5 mg/dL — ABNORMAL HIGH (ref 0.61–1.24)
GFR calc Af Amer: 51 mL/min — ABNORMAL LOW (ref >60)
GFR calc non Af Amer: 44 mL/min — ABNORMAL LOW (ref >60)
Glucose, Bld: 220 mg/dL — ABNORMAL HIGH (ref 70–99)
Potassium: 5 mmol/L (ref 3.5–5.1)
Sodium: 136 mmol/L (ref 135–145)

## 2019-10-01 LAB — GLUCOSE, CAPILLARY
Glucose-Capillary: 173 mg/dL — ABNORMAL HIGH (ref 70–99)
Glucose-Capillary: 184 mg/dL — ABNORMAL HIGH (ref 70–99)
Glucose-Capillary: 216 mg/dL — ABNORMAL HIGH (ref 70–99)
Glucose-Capillary: 225 mg/dL — ABNORMAL HIGH (ref 70–99)
Glucose-Capillary: 269 mg/dL — ABNORMAL HIGH (ref 70–99)

## 2019-10-01 LAB — MAGNESIUM: Magnesium: 2 mg/dL (ref 1.7–2.4)

## 2019-10-01 LAB — PROCALCITONIN: Procalcitonin: <0.1 ng/mL

## 2019-10-01 MED ORDER — IPRATROPIUM-ALBUTEROL 0.5-2.5 (3) MG/3ML IN SOLN
3.0000 mL | Freq: Three times a day (TID) | RESPIRATORY_TRACT | Status: DC
  Administered 2019-10-01 – 2019-10-02 (×4): 3 mL via RESPIRATORY_TRACT
  Filled 2019-10-01 (×4): qty 3

## 2019-10-01 NOTE — Progress Notes (Signed)
Physical Therapy Treatment Patient Details Name: Ernest Mclean MRN: [id number] DOB: Dec 23, 1940 Today's Date: 10/01/2019    History of Present Illness Ernest Mclean is a 79 y.o. male with medical history significant of hypertension, hyperlipidemia, diabetes mellitus, COPD, OSA on CPAP, GERD, depression with anxiety, CKD stage III, atrial fibrillation on Eliquis, CAD, dCHF, who presents with confusion and shortness of breath. Patient symptoms started yesterday, including intermittent confusion, generalized weakness, decreased oral intake, dry cough and shortness of breath. Patient was found to have oxygen desaturation to 88% on room air in ED.    PT Comments    Pt was long sitting in bed with RN in room giving medications upon arriving. He was on 4 L o2 with sao2 98%. BP 159/73. During session, pt was able to wean to 3L but unable to wean to 2L. On 2L, pt de-sats to 85%.  He was left on 3 L at conclusion of session. Pt demonstrates safe mobility but does have balance deficits when ambulating without AD. Lengthy discussion with pt/pt's spouse about safety. Pt very resistive to use of RW and O2 at home. Pt was educated on concerns of not wearing O2 and risk of falls without use of AD. Pt's spouse states," I've been trying to get him to use one for years." He will need continued education and reinforcement for use. Overall pt demonstrates good strength and tolerated session well.   He was seated in bed with spouse at bedside and call bell in reach. Acute PT will continue to follow per POC.    Follow Up Recommendations  No PT follow up     Equipment Recommendations  Rolling walker with 5" wheels;Other (comment)    Recommendations for Other Services       Precautions / Restrictions Precautions Precautions: Fall Restrictions Weight Bearing Restrictions: No    Mobility  Bed Mobility Overal bed mobility: Independent Bed Mobility: Supine to Sit;Sit to Supine     Supine to sit: Independent Sit  to supine: Independent   General bed mobility comments: safe technique  Transfers Overall transfer level: Modified independent Equipment used: Rolling walker (2 wheeled);None             General transfer comment: Pt demonstrates safer abilities with use of RW versus without  Ambulation/Gait Ambulation/Gait assistance: Supervision;Min guard Gait Distance (Feet): 300 Feet Assistive device: Rolling walker (2 wheeled);None Gait Pattern/deviations: WFL(Within Functional Limits) Gait velocity: decreased   General Gait Details: Pt ambulated without AD and with RW. therapist highly recommends use of RW at home for safety. he has 3 episodes of LOB with min assist to prevent fall when ambulating without.   Stairs             Wheelchair Mobility    Modified Rankin (Stroke Patients Only)       Balance Overall balance assessment: Needs assistance Sitting-balance support: Feet unsupported;No upper extremity supported Sitting balance-Leahy Scale: Good Sitting balance - Comments: Able to sit steady EOB   Standing balance support: During functional activity;No upper extremity supported Standing balance-Leahy Scale: Fair Standing balance comment: pt does have balance deficits                             Cognition Arousal/Alertness: Awake/alert Behavior During Therapy: WFL for tasks assessed/performed Overall Cognitive Status: Within Functional Limits for tasks assessed  General Comments: Pt is cooperative and motivated throughout session. was on 4 L upon arriving but was able to wean to 3 by end of session. attempted 2 L but pt de-sats      Exercises      General Comments        Pertinent Vitals/Pain Pain Assessment: No/denies pain    Home Living                      Prior Function            PT Goals (current goals can now be found in the care plan section) Acute Rehab PT Goals Patient Stated  Goal: to go home Progress towards PT goals: Progressing toward goals    Frequency    Min 2X/week      PT Plan Current plan remains appropriate    Co-evaluation              AM-PAC PT "6 Clicks" Mobility   Outcome Measure  Help needed turning from your back to your side while in a flat bed without using bedrails?: None Help needed moving from lying on your back to sitting on the side of a flat bed without using bedrails?: None Help needed moving to and from a bed to a chair (including a wheelchair)?: None Help needed standing up from a chair using your arms (e.g., wheelchair or bedside chair)?: None Help needed to walk in hospital room?: A Little Help needed climbing 3-5 steps with a railing? : A Little 6 Click Score: 22    End of Session Equipment Utilized During Treatment: Gait belt;Oxygen Activity Tolerance: Patient tolerated treatment well Patient left: in bed;with call bell/phone within reach;with family/visitor present Nurse Communication: Mobility status PT Visit Diagnosis: Difficulty in walking, not elsewhere classified (R26.2);Unsteadiness on feet (R26.81)     Time: [id number] PT Time Calculation (min) (ACUTE ONLY): 45 min  Charges:  $Gait Training: 23-37 mins $Therapeutic Activity: 8-22 mins                     Julaine Fusi PTA 10/01/19, 12:15 PM

## 2019-10-01 NOTE — Progress Notes (Signed)
PROGRESS NOTE    Ernest Mclean   [id number]  DOB: 11-26-1940  PCP: Juluis Pitch, MD    DOA: 09/27/2019 LOS: 4   Brief Narrative   Ernest Mclean is a 79 y.o. male with medical history significant of hypertension, hyperlipidemia, diabetes mellitus, COPD, OSA on CPAP, GERD, depression with anxiety, CKD stage III, atrial fibrillation on Eliquis, CAD, dCHF, who presented to the ED on 09/27/19 with confusion and shortness of breath.  Associated symptoms reported include generalized weakness, decreased oral intake, dry cough and shortness of breath. In the ED, O2 sat was 88% on room air, improved to 97% on 2 L/min oxygen, HR 57, BP 145/84.  Chest xray showed ill-defined airspace opacity right mid lung and left base. Suspect multifocal pneumonia, although patchy areas of pulmonary edema could present similarly.  He was treated with IV Rocephin and Zithromax and admitted to hospitalist service for management of community-acquired pneumonia.   Patient has history of a-fib and developed was having very slow ventricular response, HR as low as upper 20's in the ED according to patient's wife.  Cardiology was consulted.  Patient's Toprol dose was decreased and HR's have been upper 40's to 70's.  At baseline, he uses 2 L/min oxygen at night only.  Currently using 2 L/min continuous.  Attempting to wean to baseline, but patient may require continuous home oxygen for short time while recovering from pneumonia.     Assessment & Plan   Principal Problem:   CAP (community acquired pneumonia) Active Problems:   OSA on CPAP   COPD (chronic obstructive pulmonary disease) (HCC)   Type II diabetes mellitus with renal manifestations (HCC)   HTN (hypertension)   CAD (coronary artery disease)   GERD (gastroesophageal reflux disease)   Chronic diastolic CHF (congestive heart failure) (HCC)   CKD (chronic kidney disease), stage IIIa   Atrial fibrillation, chronic (HCC)   Acute on chronic respiratory failure  with hypoxia (HCC)   Elevated troponin   Acute metabolic encephalopathy  Acute on chronic respiratory failure with hypoxiadue toCommunity Acquired Pneumonia:  --continue IV Rocephin and Azithromycin for 5 days tx --nebulizer treatments, mucolytics, chloraseptic spray.  --follow blood cultres (no growth to date) --supplemental O2, maintain sat >= 90%  OSA: CPAP continued as at home.   Bradycardia due to A-fib with slow ventricular response:  Improved with reduction in metoprolol to 50 mg BID. HR's upper 40's to 70's. --telemetry  Chronic Atrial Fib: Rate is better as above.  He goes into A-fib in his O2 sat drops.  --Continue metoprolol and Eliquis  Acute metabolic encephalopathy:Resolved. Currently back to baseline. Likely due to hypoxia and pneumonia. Monitor.  COPD (chronic obstructive pulmonary disease):Not acutely exacerbated, no wheezing on exam. Continue bronchodilators.  Type II diabetes mellitus with renal manifestations:Most recent A1c7.9, poorly controled. Home regimen isMetformin, Humalog, Lantus.  --Lantus 35 units at bedtime --Novolog 6 units TID WC --Sliding scale Novolog --adjust insulin as needed   Hypertension: Blood pressures had run high on home doses of amlodipine, lisinopril, and metoprolol. Amlodipine was increased.  As needed hydralazine was available.  CAD (coronary artery disease): Elevated troponin:trop minimally elevated at 21. Most likely due to demand ischemia in the setting of hypoxia.  Patient without chest pain.   --continue home aspirin, pravastatin, metoprolol   GERD: continue Protonix  Chronic diastolic CHF - 2D echo on 08/02/2016 showed EF 60-65%. Euvolemic.  Was given small dose Lasix on admission due to BNP is slightly elevated 382, chest x-ray showed  questionable interstitial edema.  Continue home dose diuretics.  Monitor volume status.  CKD (chronic kidney disease), stage IIIa:Baseline creatinine 1.6-2.0. Stable.   Avoid nephrotoxic agents and hypotension, renally dose meds as indicated. --monitor BMP    Patient BMI: Body mass index is 28.83 kg/m.   DVT prophylaxis: on Eliquis  Diet:  Diet Orders (From admission, onward)    Start     Ordered   09/27/19 1549  Diet heart healthy/carb modified Room service appropriate? Yes; Fluid consistency: Thin  Diet effective now    Question Answer Comment  Diet-HS Snack? Nothing   Room service appropriate? Yes   Fluid consistency: Thin      09/27/19 1548            Code Status: Full Code    Subjective 10/01/19    Pt reported breathing much better, though still had DOE and weakness.  Still desat with ambulation.  No fever, chest pain, abdominal pain, N/V/D, dysuria, increased swelling.    Disposition Plan & Communication   Status is: Inpatient  Remains inpatient appropriate because:IV treatments appropriate due to intensity of illness or inability to take PO and current oxygen requirements remain above baseline and weaning down.   Dispo: The patient is from: Home              Anticipated d/c is to: Home              Anticipated d/c date is: 1 day              Patient currently is not medically stable to d/c.   Family Communication: updated wife at the bedside today.   Consults, Procedures, Significant Events   Consultants:   Cardiology  Procedures:   None  Antimicrobials:   Rocephin  Azithromyin   Objective   Vitals:   10/01/19 0900 10/01/19 1127 10/01/19 1346 10/01/19 1628  BP:  (!) 185/61  (!) 150/53  Pulse:  62 64 71  Resp: 19 17 (!) 22 17  Temp:  97.6 F (36.4 C)  97.7 F (36.5 C)  TempSrc:  Oral  Oral  SpO2:  98% 94% 97%  Weight:      Height:        Intake/Output Summary (Last 24 hours) at 10/01/2019 1837 Last data filed at 10/01/2019 0020 Gross per 24 hour  Intake --  Output 550 ml  Net -550 ml   Filed Weights   09/29/19 0411 09/30/19 0458 10/01/19 0415  Weight: 102.3 kg 101.6 kg 96.4 kg     Physical Exam:  Constitutional: NAD, AAOx3 HEENT: conjunctivae and lids normal, EOMI CV: RRR no M,R,G. Distal pulses +2.  No cyanosis.   RESP: CTA B/L, no wheezes, normal respiratory effort, on 3L GI: +BS, NTND Extremities: No effusions, edema, or tenderness in BLE, compression stockings on. SKIN: warm, dry and intact Neuro: II - XII grossly intact.  Sensation intact Psych: Normal mood and affect.  Appropriate judgement and reason    Labs   Data Reviewed: I have personally reviewed following labs and imaging studies  CBC: Recent Labs  Lab 09/27/19 1043 09/30/19 0443  WBC 10.1 9.5  NEUTROABS 7.6 7.2  HGB 11.6* 10.7*  HCT 36.9* 33.9*  MCV 86.2 85.2  PLT 153 [id number]   Basic Metabolic Panel: Recent Labs  Lab 09/27/19 1043 09/30/19 0443 10/01/19 0506  NA 137 137 136  K 4.8 4.7 5.0  CL 106 105 103  CO2 22 23 26   GLUCOSE 94 168* 220*  BUN 40* 46* 41*  CREATININE 2.16* 1.89* 1.50*  CALCIUM 8.6* 8.9 8.8*  MG  --   --  2.0   GFR: Estimated Creatinine Clearance: 49.6 mL/min (A) (by C-G formula based on SCr of 1.5 mg/dL (H)). Liver Function Tests: No results for input(s): AST, ALT, ALKPHOS, BILITOT, PROT, ALBUMIN in the last 168 hours. No results for input(s): LIPASE, AMYLASE in the last 168 hours. No results for input(s): AMMONIA in the last 168 hours. Coagulation Profile: No results for input(s): INR, PROTIME in the last 168 hours. Cardiac Enzymes: No results for input(s): CKTOTAL, CKMB, CKMBINDEX, TROPONINI in the last 168 hours. BNP (last 3 results) No results for input(s): PROBNP in the last 8760 hours. HbA1C: No results for input(s): HGBA1C in the last 72 hours. CBG: Recent Labs  Lab 09/30/19 1938 10/01/19 0006 10/01/19 0723 10/01/19 1126 10/01/19 1629  GLUCAP 179* 225* 173* 216* 269*   Lipid Profile: No results for input(s): CHOL, HDL, LDLCALC, TRIG, CHOLHDL, LDLDIRECT in the last 72 hours. Thyroid Function Tests: No results for input(s): TSH,  T4TOTAL, FREET4, T3FREE, THYROIDAB in the last 72 hours. Anemia Panel: No results for input(s): VITAMINB12, FOLATE, FERRITIN, TIBC, IRON, RETICCTPCT in the last 72 hours. Sepsis Labs: Recent Labs  Lab 10/01/19 0506  PROCALCITON <0.10    Recent Results (from the past 240 hour(s))  Blood Culture (routine x 2)     Status: None (Preliminary result)   Collection Time: 09/27/19 10:19 AM   Specimen: BLOOD  Result Value Ref Range Status   Specimen Description BLOOD R H  Final   Special Requests   Final    BOTTLES DRAWN AEROBIC AND ANAEROBIC Blood Culture results may not be optimal due to an inadequate volume of blood received in culture bottles   Culture   Final    NO GROWTH 4 DAYS Performed at [facility], [address] [place], [place] [address]    Report Status PENDING  Incomplete  Blood Culture (routine x 2)     Status: None (Preliminary result)   Collection Time: 09/27/19 10:19 AM   Specimen: BLOOD  Result Value Ref Range Status   Specimen Description BLOOD LAC  Final   Special Requests   Final    BOTTLES DRAWN AEROBIC AND ANAEROBIC Blood Culture results may not be optimal due to an excessive volume of blood received in culture bottles   Culture   Final    NO GROWTH 4 DAYS Performed at [facility], [address] [place], [place] [address]    Report Status PENDING  Incomplete  Respiratory Panel by RT PCR (Flu A&B, Covid) - Nasopharyngeal Swab     Status: None   Collection Time: 09/27/19 12:06 PM   Specimen: Nasopharyngeal Swab  Result Value Ref Range Status   SARS Coronavirus 2 by RT PCR NEGATIVE NEGATIVE Final    Comment: (NOTE) SARS-CoV-2 target nucleic acids are NOT DETECTED. The SARS-CoV-2 RNA is generally detectable in upper respiratoy specimens during the acute phase of infection. The lowest concentration of SARS-CoV-2 viral copies this assay can detect is 131 copies/mL. A negative result does not preclude SARS-Cov-2 infection and should not  be used as the sole basis for treatment or other patient management decisions. A negative result may occur with  improper specimen collection/handling, submission of specimen other than nasopharyngeal swab, presence of viral mutation(s) within the areas targeted by this assay, and inadequate number of viral copies (<131 copies/mL). A negative result must be combined with clinical  observations, patient history, and epidemiological information. The expected result is Negative. Fact Sheet for Patients:  PinkCheek.be Fact Sheet for Healthcare Providers:  GravelBags.it This test is not yet ap proved or cleared by the [place] FDA and  has been authorized for detection and/or diagnosis of SARS-CoV-2 by FDA under an Emergency Use Authorization (EUA). This EUA will remain  in effect (meaning this test can be used) for the duration of the COVID-19 declaration under Section 564(b)(1) of the Act, 21 U.S.C. section 360bbb-3(b)(1), unless the authorization is terminated or revoked sooner.    Influenza A by PCR NEGATIVE NEGATIVE Final   Influenza B by PCR NEGATIVE NEGATIVE Final    Comment: (NOTE) The Xpert Xpress SARS-CoV-2/FLU/RSV assay is intended as an aid in  the diagnosis of influenza from Nasopharyngeal swab specimens and  should not be used as a sole basis for treatment. Nasal washings and  aspirates are unacceptable for Xpert Xpress SARS-CoV-2/FLU/RSV  testing. Fact Sheet for Patients: PinkCheek.be Fact Sheet for Healthcare Providers: GravelBags.it This test is not yet approved or cleared by the [place] FDA and  has been authorized for detection and/or diagnosis of SARS-CoV-2 by  FDA under an Emergency Use Authorization (EUA). This EUA will remain  in effect (meaning this test can be used) for the duration of the  Covid-19 declaration under Section 564(b)(1) of  the Act, 21  U.S.C. section 360bbb-3(b)(1), unless the authorization is  terminated or revoked. Performed at [facility], [address], [place], [place] [address]       Imaging Studies   No results found.   Medications   Scheduled Meds: . amLODipine  10 mg Oral Daily  . apixaban  5 mg Oral BID  . vitamin C  500 mg Oral Daily  . aspirin EC  81 mg Oral Daily  . citalopram  20 mg Oral QHS  . dextromethorphan-guaiFENesin  1 tablet Oral BID  . furosemide  20 mg Oral Daily  . insulin aspart  0-5 Units Subcutaneous QHS  . insulin aspart  0-9 Units Subcutaneous TID WC  . insulin aspart  6 Units Subcutaneous TID WC  . insulin glargine  35 Units Subcutaneous QHS  . ipratropium-albuterol  3 mL Nebulization TID  . lisinopril  5 mg Oral Daily  . metoprolol tartrate  50 mg Oral BID  . mometasone-formoterol  2 puff Inhalation BID  . pantoprazole  40 mg Oral Daily  . pravastatin  20 mg Oral QPM  . pregabalin  25 mg Oral BID  . sodium chloride flush  10 mL Intravenous Q12H   Continuous Infusions: . sodium chloride 10 mL/hr at 09/30/19 1301       LOS: 4 days     Enzo Bi, MD [facility]  10/01/2019, 6:37 PM    If 7PM-7AM, please contact night-coverage. How to contact the [facility] Attending or Consulting provider [place] or covering provider during after hours [place], for this patient?    1. Check the care team in [facility] and look for a) attending/consulting [facility] provider listed and b) the [facility] team listed 2. Log into [email or web address] and use Eva's universal password to access. If you do not have the password, please contact the hospital operator. 3. Locate the [facility] provider you are looking for under [facility] and page to a number that you can be directly reached. 4. If you still have difficulty reaching the provider, please page the [facility] (Director on Call) for the Hospitalists listed on  amion for assistance.

## 2019-10-01 NOTE — Progress Notes (Signed)
Inpatient Diabetes Program Recommendations  AACE/ADA: New Consensus Statement on Inpatient Glycemic Control (2015)  Target Ranges:  Prepandial:   less than 140 mg/dL      Peak postprandial:   less than 180 mg/dL (1-2 hours)      Critically ill patients:  140 - 180 mg/dL    Results for [name], Ernest Mclean (MRN [id number]) as of 10/01/2019 13:25  Ref. Range 10/01/2019 07:23 10/01/2019 11:26  Glucose-Capillary Latest Ref Range: 70 - 99 mg/dL 173 (H) 216 (H)    Admit with: Acute on chronic respiratory failure with hypoxia due to CAP   History: DM, COPD, CKD, CHF  Home DM Meds: Humalog 10-25 units TID meal coverage       Lantus 60 units QHS       Metformin 1000 mg QPM  Current Orders: Lantus 35 units QHS      Novolog Sensitive Correction Scale/ SSI (0-9 units) Q4 hours   MD- Please consider the following in-hospital insulin adjustments:  - Increase Novolog Meal Coverage: Novolog 8 units TID with meals (portion of pt's home dose)   --Will follow patient during hospitalization--  Tama Headings RN, MSN, BC-ADM Inpatient Diabetes Coordinator Team Pager [phone] (8a-5p)

## 2019-10-01 NOTE — Plan of Care (Addendum)
Patient stable ambulated in hallway and around room. Oxygen levels remain above 90%while on 3l nasal cannula. See PT note for episode of desaturation to 87%  while working with physical therapy. No distress noted. Continue to monitor.     Problem: Education: Goal: Ability to demonstrate management of disease process will improve Outcome: Progressing Goal: Ability to verbalize understanding of medication therapies will improve Outcome: Progressing Goal: Individualized Educational Video(s) Outcome: Progressing   Problem: Activity: Goal: Capacity to carry out activities will improve Outcome: Progressing   Problem: Cardiac: Goal: Ability to achieve and maintain adequate cardiopulmonary perfusion will improve Outcome: Progressing   Problem: Education: Goal: Knowledge of General Education information will improve Description: Including pain rating scale, medication(s)/side effects and non-pharmacologic comfort measures Outcome: Progressing   Problem: Health Behavior/Discharge Planning: Goal: Ability to manage health-related needs will improve Outcome: Progressing   Problem: Clinical Measurements: Goal: Ability to maintain clinical measurements within normal limits will improve Outcome: Progressing Goal: Will remain free from infection Outcome: Progressing Goal: Diagnostic test results will improve Outcome: Progressing Goal: Respiratory complications will improve Outcome: Progressing Goal: Cardiovascular complication will be avoided Outcome: Progressing   Problem: Activity: Goal: Risk for activity intolerance will decrease Outcome: Progressing   Problem: Nutrition: Goal: Adequate nutrition will be maintained Outcome: Progressing   Problem: Coping: Goal: Level of anxiety will decrease Outcome: Progressing   Problem: Elimination: Goal: Will not experience complications related to bowel motility Outcome: Progressing Goal: Will not experience complications related to  urinary retention Outcome: Progressing   Problem: Pain Managment: Goal: General experience of comfort will improve Outcome: Progressing   Problem: Safety: Goal: Ability to remain free from injury will improve Outcome: Progressing   Problem: Skin Integrity: Goal: Risk for impaired skin integrity will decrease Outcome: Progressing

## 2019-10-02 LAB — CULTURE, BLOOD (ROUTINE X 2)
Culture: NO GROWTH
Culture: NO GROWTH

## 2019-10-02 LAB — BASIC METABOLIC PANEL
Anion gap: 6 (ref 5–15)
BUN: 32 mg/dL — ABNORMAL HIGH (ref 8–23)
CO2: 29 mmol/L (ref 22–32)
Calcium: 8.8 mg/dL — ABNORMAL LOW (ref 8.9–10.3)
Chloride: 105 mmol/L (ref 98–111)
Creatinine, Ser: 1.55 mg/dL — ABNORMAL HIGH (ref 0.61–1.24)
GFR calc Af Amer: 49 mL/min — ABNORMAL LOW (ref >60)
GFR calc non Af Amer: 43 mL/min — ABNORMAL LOW (ref >60)
Glucose, Bld: 169 mg/dL — ABNORMAL HIGH (ref 70–99)
Potassium: 4.6 mmol/L (ref 3.5–5.1)
Sodium: 140 mmol/L (ref 135–145)

## 2019-10-02 LAB — CBC
HCT: 34 % — ABNORMAL LOW (ref 39.0–52.0)
Hemoglobin: 10.7 g/dL — ABNORMAL LOW (ref 13.0–17.0)
MCH: 26.8 pg (ref 26.0–34.0)
MCHC: 31.5 g/dL (ref 30.0–36.0)
MCV: 85 fL (ref 80.0–100.0)
Platelets: 189 10*3/uL (ref 150–400)
RBC: 4 MIL/uL — ABNORMAL LOW (ref 4.22–5.81)
RDW: 15.4 % (ref 11.5–15.5)
WBC: 8.1 10*3/uL (ref 4.0–10.5)
nRBC: 0 % (ref 0.0–0.2)

## 2019-10-02 LAB — MAGNESIUM: Magnesium: 1.8 mg/dL (ref 1.7–2.4)

## 2019-10-02 LAB — GLUCOSE, CAPILLARY: Glucose-Capillary: 116 mg/dL — ABNORMAL HIGH (ref 70–99)

## 2019-10-02 MED ORDER — METOPROLOL TARTRATE 100 MG PO TABS: 50.0000 mg | ORAL_TABLET | Freq: Two times a day (BID) | ORAL | 0 refills | Status: DC

## 2019-10-02 NOTE — TOC Transition Note (Signed)
Transition of Care [facility]) - CM/SW Discharge Note   Patient Details  Name: Ernest Mclean MRN: [id number] Date of Birth: May 05, 1941  Transition of Care [facility]) CM/SW Contact:  Shelbie Ammons, RN Phone Number: 10/02/2019, 12:31 PM   Clinical Narrative:   RNCM assessed patient at bedside with wife present as well. Wife has many concerns about disposition of patient regarding need for oxygen, and home health services. Discussed all with both patient and wife and listened to their concerns. Assured them that this CM would follow up with MD, PT and DME company. And that if [facility] is warranted it would be set up prior to their leaving.  RNCM messaged Dr. Billie Ruddy regarding their concerns, she will meet with them.  RNCM contacted PT, Nate re their concerns and he will go to room and speak with them as well.  RNCM contacted Zach with [employer] and he will go to room to speak with patient and wife regarding their current oxygen needs.           Patient Goals and CMS Choice        Discharge Placement                       Discharge Plan and Services   Discharge Planning Services: CM Consult              DME Agency: [employer]     Representative spoke with at DME Agency: Thedore Mins will speak with patient and wife            Social Determinants of Health (SDOH) Interventions     Readmission Risk Interventions No flowsheet data found.

## 2019-10-02 NOTE — Progress Notes (Signed)
[facility] Cardiology    SUBJECTIVE: Ernest Mclean is a 79 year old male with a past medical history significant for HFpEF, longstanding persistent atrial fibrillation, anticoagulated with Eliquis, COPD, sleep apnea, type 2 diabetes, chronic kidney disease, hypertension, and hyperlipidemia who presented to the ED on 09/27/19 for generalized weakness and a cough.  Chest xray revealed probable multifocal pneumonia vs pulmonary edema, O2 sats were in the 80s, and he was noted to be bradycardic.  He was placed on abx, oxygen, and metoprolol was decreased from 100mg  to 50mg .   Ernest Mclean reports significant improvement in his symptoms. He denies chest pain or palpitations.  Shortness of breath has improved.  He denies lower extremity swelling, orthopnea, or PND.  He denies dizziness, lightheadedness, or syncopal/presyncopal episodes.    Vitals:   10/01/19 2009 10/02/19 0520 10/02/19 0743 10/02/19 0755  BP:  (!) 145/73  (!) 155/63  Pulse:  67 73 65  Resp:   18 19  Temp:  98.7 F (37.1 C)  98.4 F (36.9 C)  TempSrc:  Oral  Oral  SpO2: 97% 95% 93% 97%  Weight:  100 kg    Height:        No intake or output data in the 24 hours ending 10/02/19 0829    PHYSICAL EXAM  General: Well developed, well nourished, in no acute distress HEENT:  Normocephalic and atramatic Neck:  No JVD.  Lungs: Clear bilaterally to auscultation and percussion. Heart: HRRR . Normal S1 and S2 without gallops or murmurs.  Abdomen: Bowel sounds are positive, abdomen soft and non-tender  Msk:  Back normal, normal gait. Normal strength and tone for age. Extremities: No clubbing, cyanosis or edema.   Neuro: Alert and oriented X 3. Psych:  Good affect, responds appropriately   LABS: Basic Metabolic Panel: Recent Labs    10/01/19 0506 10/02/19 0457  NA 136 140  K 5.0 4.6  CL 103 105  CO2 26 29  GLUCOSE 220* 169*  BUN 41* 32*  CREATININE 1.50* 1.55*  CALCIUM 8.8* 8.8*  MG 2.0 1.8   Liver Function Tests: No results for  input(s): AST, ALT, ALKPHOS, BILITOT, PROT, ALBUMIN in the last 72 hours. No results for input(s): LIPASE, AMYLASE in the last 72 hours. CBC: Recent Labs    09/30/19 0443 10/02/19 0457  WBC 9.5 8.1  NEUTROABS 7.2  --   HGB 10.7* 10.7*  HCT 33.9* 34.0*  MCV 85.2 85.0  PLT 185 189   Cardiac Enzymes: No results for input(s): CKTOTAL, CKMB, CKMBINDEX, TROPONINI in the last 72 hours. BNP: Invalid input(s): POCBNP D-Dimer: No results for input(s): DDIMER in the last 72 hours. Hemoglobin A1C: No results for input(s): HGBA1C in the last 72 hours. Fasting Lipid Panel: No results for input(s): CHOL, HDL, LDLCALC, TRIG, CHOLHDL, LDLDIRECT in the last 72 hours. Thyroid Function Tests: No results for input(s): TSH, T4TOTAL, T3FREE, THYROIDAB in the last 72 hours.  Invalid input(s): FREET3 Anemia Panel: No results for input(s): VITAMINB12, FOLATE, FERRITIN, TIBC, IRON, RETICCTPCT in the last 72 hours.  No results found.   ASSESSMENT AND PLAN:  Principal Problem:   CAP (community acquired pneumonia) Active Problems:   OSA on CPAP   COPD (chronic obstructive pulmonary disease) (HCC)   Type II diabetes mellitus with renal manifestations (HCC)   HTN (hypertension)   CAD (coronary artery disease)   GERD (gastroesophageal reflux disease)   Chronic diastolic CHF (congestive heart failure) (HCC)   CKD (chronic kidney disease), stage IIIa   Atrial  fibrillation, chronic (HCC)   Acute on chronic respiratory failure with hypoxia (HCC)   Elevated troponin   Acute metabolic encephalopathy    1. Bradycardia   -Rate improved with reduction of metoprolol from 100mg  to 50mg    -Okay to discharge from a cardiac standpoint with follow up with Ernest Mclean in 2 weeks   2.  Atrial fibrillation   -Metoprolol decreased; will follow rate in an outpatient setting  -Continue Eliquis 5mg  BID    Avie Arenas  [facility] 10/02/2019 8:29 AM

## 2019-10-02 NOTE — Discharge Summary (Signed)
Physician Discharge Summary   Ernest Mclean  male DOB: [birth year]  [id number]  PCP: Juluis Pitch, MD  Admit date: 09/27/2019 Discharge date: 10/02/2019  Admitted From: home Disposition:  Home Wife updated in detail about discharge plans prior to discharge.  Home Health: Yes CODE STATUS: Full code  Discharge Instructions    Diet - low sodium heart healthy   Complete by: As directed    Discharge instructions   Complete by: As directed    You have finished 5 days of IV antibiotics for your pneumonia, and now you are back on room air.  The weakness will take some more time to recover.  We have decreased your metoprolol to 50 mg twice a day.  Please continue the reduced dose and follow up with your outpatient cardiologist to see if you should go back to the previous 100 mg twice a day.   Dr. Enzo Bi - -   Increase activity slowly   Complete by: As directed        Hospital Course:  For full details, please see H&P, progress notes, consult notes and ancillary notes.  Briefly,  Ernest Mclean a 79 y.o.Caucasian malewith medical history significant ofhypertension, hyperlipidemia, diabetes mellitus, COPD, OSA on CPAP, GERD, depression with anxiety, CKD stage III, atrial fibrillation on Eliquis, CAD,dCHF, who presented to the ED on 09/27/19 with confusion and shortness of breath.  Suspect multifocal pneumonia, although patchy areas of pulmonary edema could present similarly.  He was treated with IV Rocephin and Zithromax and admitted to hospitalist service for management of community-acquired pneumonia.   Patient has history of a-fib and developed was having very slow ventricular response, HR as low as upper 20's in the ED according to patient's wife.  Cardiology was consulted.  Patient's Toprol dose was decreased and HR's have been upper 40's to 70's.  At baseline, he uses 2 L/min oxygen at night only.  Currently using 2 L/min continuous.  Attempting to wean to  baseline, but patient may require continuous home oxygen for short time while recovering from pneumonia.  Acute on chronic respiratory failure with hypoxiadue toCommunity Acquired Pneumonia:  In the ED, O2 sat was 88% on room air, improved to 97% on 2 L/min oxygen.  Chest xray showed ill-defined airspace opacity right mid lung and left base. Pt received 5 days of IV Rocephin and Azithromycin, as well as nebulizer treatments, mucolytics, chloraseptic spray.  Prior to discharge, pt was able to maintain adequate O2 saturation on RA while ambulating.  OSA:  CPAP nightly continued as at home.   Bradycardia due to A-fib with slow ventricular response:  Improved with reduction in metoprolol to 50 mg BID (down from 100 mg BID). HR's upper 40's to 70's.  Pt is discharged on metop 50 mg BID and follow up with outpatient cardiology.  Continued Eliquis.  Acute metabolic encephalopathy:Resolved. Currently back to baseline. Likely due to hypoxia and pneumonia.   COPD (chronic obstructive pulmonary disease): Not acutely exacerbated, no wheezing on exam.Continue bronchodilators.  Type II diabetes mellitus with renal manifestations:Most recent A1c7.9.  Discharged on Home regimen ofMetformin, Humalog, Lantus.   Hypertension:  Continued on amlodipine, lisinopril, and metoprolol.   CAD (coronary artery disease): Elevated troponin: trop minimally elevated at 21. Most likely due to demand ischemia in the setting of hypoxia.  Patient without chest pain.  Continued home aspirin, pravastatin, metoprolol   GERD:  continued Protonix  Chronic diastolic CHF 2D echo on [id number] showed EF 60-65%. Euvolemic.  Was  given small dose Lasix on admission due to BNP is slightly elevated 382, chest x-ray showed questionable interstitial edema.  Continue home dose diuretics.    CKD (chronic kidney disease), stage IIIa:Baseline creatinine 1.6-2.0. Stable.    Discharge Diagnoses:  Principal  Problem:   CAP (community acquired pneumonia) Active Problems:   OSA on CPAP   COPD (chronic obstructive pulmonary disease) (HCC)   Type II diabetes mellitus with renal manifestations (HCC)   HTN (hypertension)   CAD (coronary artery disease)   GERD (gastroesophageal reflux disease)   Chronic diastolic CHF (congestive heart failure) (HCC)   CKD (chronic kidney disease), stage IIIa   Atrial fibrillation, chronic (HCC)   Acute on chronic respiratory failure with hypoxia (HCC)   Elevated troponin   Acute metabolic encephalopathy    Discharge Instructions:  Allergies as of 10/02/2019      Reactions   Codeine Hives, Rash, Swelling   Hydralazine Other (See Comments)   tongue swollen and couldn't wake up ---- not positive it was this or a mix of this with something else or high sugar   Penicillins Other (See Comments)   Passed out (at 79 yrs old) Has patient had a PCN reaction causing immediate rash, facial/tongue/throat swelling, SOB or lightheadedness with hypotension: No Has patient had a PCN reaction causing severe rash involving mucus membranes or skin necrosis: No Has patient had a PCN reaction that required hospitalization No Has patient had a PCN reaction occurring within the last 10 years: No If all of the above answers are "NO", then may proceed with Cephalosporin use.      Medication List    TAKE these medications   albuterol 108 (90 Base) MCG/ACT inhaler Commonly known as: VENTOLIN HFA Inhale 2 puffs into the lungs every 4 (four) hours as needed for wheezing or shortness of breath.   amLODipine 5 MG tablet Commonly known as: NORVASC Take 5 mg by mouth daily.   apixaban 5 MG Tabs tablet Commonly known as: ELIQUIS Take 2.5 mg by mouth 2 (two) times daily.   aspirin EC 81 MG tablet Take 81 mg by mouth daily.   citalopram 20 MG tablet Commonly known as: CELEXA Take 20 mg by mouth at bedtime.   CRANBERRY PO Take 820 mg by mouth daily.   diazepam 5 MG  tablet Commonly known as: VALIUM Take 5 mg by mouth every 12 (twelve) hours as needed for anxiety.   ergocalciferol 1.25 MG (50000 UT) capsule Commonly known as: VITAMIN D2 Take 50,000 Units by mouth every 30 (thirty) days.   furosemide 20 MG tablet Commonly known as: LASIX Take 20 mg by mouth daily.   HumaLOG 100 UNIT/ML injection Generic drug: insulin lispro Inject 10-25 Units into the skin 3 (three) times daily with meals. (sliding scale as needed to a maximum of 150u daily)   insulin glargine 100 UNIT/ML injection Commonly known as: LANTUS Inject 60 Units into the skin at bedtime.   lisinopril 5 MG tablet Commonly known as: ZESTRIL Take 5 mg by mouth daily.   metFORMIN 500 MG 24 hr tablet Commonly known as: GLUCOPHAGE-XR Take 1,000 mg by mouth daily with supper.   metoprolol tartrate 100 MG tablet Commonly known as: LOPRESSOR Take 0.5 tablets (50 mg total) by mouth 2 (two) times daily. This is a decrease from your previous 100 mg twice a day. What changed:   how much to take  additional instructions   OMEGA-3 FATTY ACIDS-VITAMIN E PO Take 1 capsule by mouth daily.  omeprazole 20 MG capsule Commonly known as: PRILOSEC Take 20 mg by mouth daily.   pravastatin 20 MG tablet Commonly known as: PRAVACHOL Take 20 mg by mouth every evening.   pregabalin 25 MG capsule Commonly known as: LYRICA Take 25 mg by mouth 2 (two) times daily.   tiotropium 18 MCG inhalation capsule Commonly known as: SPIRIVA Place 18 mcg into inhaler and inhale daily.   vitamin B-12 500 MCG tablet Commonly known as: CYANOCOBALAMIN Take 500 mcg by mouth daily.   vitamin C 500 MG tablet Commonly known as: ASCORBIC ACID Take 500 mg by mouth daily.   Wixela Inhub 500-50 MCG/DOSE Aepb Generic drug: Fluticasone-Salmeterol USE 1 INHALATION BY MOUTH TWICE DAILY What changed: See the new instructions.       Follow-up Information    Juluis Pitch, MD. Schedule an appointment as  soon as possible for a visit in 1 week(s).   Specialty: Family Medicine Contact information: [address] [place] [address] [phone]        Your outpatient cardiologist. Schedule an appointment as soon as possible for a visit in 2 week(s).           Allergies  Allergen Reactions  . Codeine Hives, Rash and Swelling  . Hydralazine Other (See Comments)    tongue swollen and couldn't wake up ---- not positive it was this or a mix of this with something else or high sugar  . Penicillins Other (See Comments)    Passed out (at 79 yrs old) Has patient had a PCN reaction causing immediate rash, facial/tongue/throat swelling, SOB or lightheadedness with hypotension: No Has patient had a PCN reaction causing severe rash involving mucus membranes or skin necrosis: No Has patient had a PCN reaction that required hospitalization No Has patient had a PCN reaction occurring within the last 10 years: No If all of the above answers are "NO", then may proceed with Cephalosporin use.      The results of significant diagnostics from this hospitalization (including imaging, microbiology, ancillary and laboratory) are listed below for reference.   Consultations:   Procedures/Studies: DG Chest 2 View  Result Date: 09/27/2019 CLINICAL DATA:  Shortness of breath EXAM: CHEST - 2 VIEW COMPARISON:  June 06, 2019 FINDINGS: There is ill-defined airspace opacity in the right mid lung as well as in the left base. Lungs elsewhere are clear. Heart is upper normal in size with pulmonary vascularity normal. Patient is status post median sternotomy. No adenopathy. No bone lesions. IMPRESSION: Ill-defined airspace opacity right mid lung and left base. Suspect multifocal pneumonia, although patchy areas of pulmonary edema could present similarly. Heart upper normal in size. No adenopathy evident. Electronically Signed   By: Lowella Grip III M.D.   On: 09/27/2019 11:17      Labs: BNP (last 3  results) Recent Labs    01/03/19 1248 09/27/19 1043  BNP 217.0* [id number]*   Basic Metabolic Panel: Recent Labs  Lab 09/27/19 1043 09/30/19 0443 10/01/19 0506 10/02/19 0457  NA 137 137 136 140  K 4.8 4.7 5.0 4.6  CL 106 105 103 105  CO2 22 23 26 29   GLUCOSE 94 168* 220* 169*  BUN 40* 46* 41* 32*  CREATININE 2.16* 1.89* 1.50* 1.55*  CALCIUM 8.6* 8.9 8.8* 8.8*  MG  --   --  2.0 1.8   Liver Function Tests: No results for input(s): AST, ALT, ALKPHOS, BILITOT, PROT, ALBUMIN in the last 168 hours. No results for input(s): LIPASE, AMYLASE in the  last 168 hours. No results for input(s): AMMONIA in the last 168 hours. CBC: Recent Labs  Lab 09/27/19 1043 09/30/19 0443 10/02/19 0457  WBC 10.1 9.5 8.1  NEUTROABS 7.6 7.2  --   HGB 11.6* 10.7* 10.7*  HCT 36.9* 33.9* 34.0*  MCV 86.2 85.2 85.0  PLT 153 185 189   Cardiac Enzymes: No results for input(s): CKTOTAL, CKMB, CKMBINDEX, TROPONINI in the last 168 hours. BNP: Invalid input(s): POCBNP CBG: Recent Labs  Lab 10/01/19 0723 10/01/19 1126 10/01/19 1629 10/01/19 2135 10/02/19 0756  GLUCAP 173* 216* 269* 184* 116*   D-Dimer No results for input(s): DDIMER in the last 72 hours. Hgb A1c No results for input(s): HGBA1C in the last 72 hours. Lipid Profile No results for input(s): CHOL, HDL, LDLCALC, TRIG, CHOLHDL, LDLDIRECT in the last 72 hours. Thyroid function studies No results for input(s): TSH, T4TOTAL, T3FREE, THYROIDAB in the last 72 hours.  Invalid input(s): FREET3 Anemia work up No results for input(s): VITAMINB12, FOLATE, FERRITIN, TIBC, IRON, RETICCTPCT in the last 72 hours. Urinalysis    Component Value Date/Time   COLORURINE YELLOW (A) 07/09/2015 1532   APPEARANCEUR CLEAR (A) 07/09/2015 1532   LABSPEC 1.009 07/09/2015 1532   PHURINE 5.0 07/09/2015 1532   GLUCOSEU 150 (A) 07/09/2015 1532   HGBUR NEGATIVE 07/09/2015 1532   BILIRUBINUR NEGATIVE 07/09/2015 1532   KETONESUR NEGATIVE 07/09/2015 1532    PROTEINUR 30 (A) 07/09/2015 1532   NITRITE NEGATIVE 07/09/2015 1532   LEUKOCYTESUR 3+ (A) 07/09/2015 1532   Sepsis Labs Invalid input(s): PROCALCITONIN,  WBC,  LACTICIDVEN Microbiology Recent Results (from the past 240 hour(s))  Blood Culture (routine x 2)     Status: None   Collection Time: 09/27/19 10:19 AM   Specimen: BLOOD  Result Value Ref Range Status   Specimen Description BLOOD R H  Final   Special Requests   Final    BOTTLES DRAWN AEROBIC AND ANAEROBIC Blood Culture results may not be optimal due to an inadequate volume of blood received in culture bottles   Culture   Final    NO GROWTH 5 DAYS Performed at [facility], [address] [place], [place] [address]    Report Status 10/02/2019 FINAL  Final  Blood Culture (routine x 2)     Status: None   Collection Time: 09/27/19 10:19 AM   Specimen: BLOOD  Result Value Ref Range Status   Specimen Description BLOOD LAC  Final   Special Requests   Final    BOTTLES DRAWN AEROBIC AND ANAEROBIC Blood Culture results may not be optimal due to an excessive volume of blood received in culture bottles   Culture   Final    NO GROWTH 5 DAYS Performed at [facility], [address] [place],  [address]    Report Status 10/02/2019 FINAL  Final  Respiratory Panel by RT PCR (Flu A&B, Covid) - Nasopharyngeal Swab     Status: None   Collection Time: 09/27/19 12:06 PM   Specimen: Nasopharyngeal Swab  Result Value Ref Range Status   SARS Coronavirus 2 by RT PCR NEGATIVE NEGATIVE Final    Comment: (NOTE) SARS-CoV-2 target nucleic acids are NOT DETECTED. The SARS-CoV-2 RNA is generally detectable in upper respiratoy specimens during the acute phase of infection. The lowest concentration of SARS-CoV-2 viral copies this assay can detect is 131 copies/mL. A negative result does not preclude SARS-Cov-2 infection and should not be used as the sole basis for treatment or other patient management decisions. A  negative  result may occur with  improper specimen collection/handling, submission of specimen other than nasopharyngeal swab, presence of viral mutation(s) within the areas targeted by this assay, and inadequate number of viral copies (<131 copies/mL). A negative result must be combined with clinical observations, patient history, and epidemiological information. The expected result is Negative. Fact Sheet for Patients:  PinkCheek.be Fact Sheet for Healthcare Providers:  GravelBags.it This test is not yet ap proved or cleared by the [place] FDA and  has been authorized for detection and/or diagnosis of SARS-CoV-2 by FDA under an Emergency Use Authorization (EUA). This EUA will remain  in effect (meaning this test can be used) for the duration of the COVID-19 declaration under Section 564(b)(1) of the Act, 21 U.S.C. section 360bbb-3(b)(1), unless the authorization is terminated or revoked sooner.    Influenza A by PCR NEGATIVE NEGATIVE Final   Influenza B by PCR NEGATIVE NEGATIVE Final    Comment: (NOTE) The Xpert Xpress SARS-CoV-2/FLU/RSV assay is intended as an aid in  the diagnosis of influenza from Nasopharyngeal swab specimens and  should not be used as a sole basis for treatment. Nasal washings and  aspirates are unacceptable for Xpert Xpress SARS-CoV-2/FLU/RSV  testing. Fact Sheet for Patients: PinkCheek.be Fact Sheet for Healthcare Providers: GravelBags.it This test is not yet approved or cleared by the [place] FDA and  has been authorized for detection and/or diagnosis of SARS-CoV-2 by  FDA under an Emergency Use Authorization (EUA). This EUA will remain  in effect (meaning this test can be used) for the duration of the  Covid-19 declaration under Section 564(b)(1) of the Act, 21  U.S.C. section 360bbb-3(b)(1), unless the authorization is  terminated or  revoked. Performed at [facility], [address] [place], [place] [address]      Total time spend on discharging this patient, including the last patient exam, discussing the hospital stay, instructions for ongoing care as it relates to all pertinent caregivers, as well as preparing the medical discharge records, prescriptions, and/or referrals as applicable, is 50 minutes.    Enzo Bi, MD  [facility] 10/02/2019, 8:39 AM  If 7PM-7AM, please contact night-coverage

## 2019-10-16 ENCOUNTER — Encounter: Payer: Medicare PPO | Attending: Internal Medicine

## 2019-10-16 ENCOUNTER — Other Ambulatory Visit: Payer: Self-pay

## 2019-10-16 DIAGNOSIS — I5032 Chronic diastolic (congestive) heart failure: Secondary | ICD-10-CM

## 2019-10-16 NOTE — Progress Notes (Signed)
Virtual Visit completed. Patient informed on EP and RD appointment and 6 Minute walk test. Patient also informed of patient health questionnaires on My Chart. Patient Verbalizes understanding. Visit diagnosis can be found in [facility] 09/27/2019.

## 2019-10-22 ENCOUNTER — Encounter: Payer: Self-pay | Admitting: Internal Medicine

## 2019-10-22 ENCOUNTER — Ambulatory Visit: Payer: Medicare PPO | Admitting: Internal Medicine

## 2019-10-22 ENCOUNTER — Other Ambulatory Visit: Payer: Self-pay

## 2019-10-22 VITALS — BP 118/72 | HR 61 | Temp 98.1°F | Ht 72.0 in | Wt 222.2 lb

## 2019-10-22 DIAGNOSIS — J9611 Chronic respiratory failure with hypoxia: Secondary | ICD-10-CM

## 2019-10-22 DIAGNOSIS — J449 Chronic obstructive pulmonary disease, unspecified: Secondary | ICD-10-CM | POA: Diagnosis not present

## 2019-10-22 DIAGNOSIS — G4733 Obstructive sleep apnea (adult) (pediatric): Secondary | ICD-10-CM

## 2019-10-22 NOTE — Progress Notes (Signed)
[facility] Consultation      MRN# [id number] Ernest Mclean 01-31-1941  brief History: 79 yo male with COPD, PVD with peripheral claudication, OSA  worst after PNA in 07/2014, now following for COPD and Chronic Hypoxic respiratory failure on 2L o2 (maily for PVD and at night for OSA). Pulmonary nodules on CT, sub cm, mainly due to inflammatory/infectious process.     CC  Follow-up COPD Follow-up OSA   HPI Patient with recent COPD exacerbation and hospitalization Recurrent admissions to the hospital for COPD and multilobar pneumonia Overall prognosis is poor Patient with recurrent bouts of COPD exacerbation Seems to be responding to antibiotics and prednisone  Currently on Advair and Spiriva and oxygen therapy Uses albuterol as needed  Chronic respiratory failure on oxygen therapy Patient uses and benefits from oxygen therapy   No exacerbation at this time No evidence of heart failure at this time No evidence or signs of infection at this time No respiratory distress No fevers, chills, nausea, vomiting, diarrhea No evidence of lower extremity edema No evidence hemoptysis  Patient uses incentive spirometry 20 times per day    Regarding his OSA Patient had previous compliance reports of 100%  Current compliance report Patient is 80% compliance for days and greater than 4 hours AHI is 13  Patient use incentive spirometry 20-25 times per day  Patient with recent admission to the hospital in May for 6 days Discharged on oxygen therapy to 3 L and currently is back down to 2 L Have explained to him that multiple COPD exacerbations will decrease his quality of life Wife also notified of this quality of life Patient has chronic hypoxic respiratory failure with end-stage COPD along with cardiomyopathy Prognosis is poor, but patient seems to be in good spirits at this time     Medication:   Current Outpatient Medications:  .  albuterol (PROVENTIL  HFA;VENTOLIN HFA) 108 (90 Base) MCG/ACT inhaler, Inhale 2 puffs into the lungs every 4 (four) hours as needed for wheezing or shortness of breath., Disp: 25.5 Inhaler, Rfl: 1 .  amLODipine (NORVASC) 5 MG tablet, Take 5 mg by mouth daily. , Disp: , Rfl:  .  apixaban (ELIQUIS) 5 MG TABS tablet, Take 2.5 mg by mouth 2 (two) times daily. , Disp: , Rfl:  .  aspirin EC 81 MG tablet, Take 81 mg by mouth daily., Disp: , Rfl:  .  citalopram (CELEXA) 20 MG tablet, Take 20 mg by mouth at bedtime. , Disp: , Rfl: 5 .  CRANBERRY PO, Take 820 mg by mouth daily., Disp: , Rfl:  .  cyanocobalamin 500 MCG tablet, Take 500 mcg by mouth daily., Disp: , Rfl:  .  diazepam (VALIUM) 5 MG tablet, Take 5 mg by mouth every 12 (twelve) hours as needed for anxiety. , Disp: , Rfl:  .  ergocalciferol (VITAMIN D2) 50000 units capsule, Take 50,000 Units by mouth every 30 (thirty) days., Disp: , Rfl:  .  furosemide (LASIX) 20 MG tablet, Take 20 mg by mouth daily. , Disp: , Rfl:  .  HUMALOG 100 UNIT/ML injection, Inject 10-25 Units into the skin 3 (three) times daily with meals. (sliding scale as needed to a maximum of 150u daily), Disp: , Rfl:  .  insulin glargine (LANTUS) 100 UNIT/ML injection, Inject 60 Units into the skin at bedtime. , Disp: , Rfl:  .  lisinopril (PRINIVIL,ZESTRIL) 5 MG tablet, Take 5 mg by mouth daily., Disp: , Rfl:  .  metFORMIN (GLUCOPHAGE-XR) 500  MG 24 hr tablet, Take 1,000 mg by mouth daily with supper., Disp: , Rfl:  .  metoprolol tartrate (LOPRESSOR) 100 MG tablet, Take 0.5 tablets (50 mg total) by mouth 2 (two) times daily. This is a decrease from your previous 100 mg twice a day., Disp: , Rfl: 0 .  OMEGA-3 FATTY ACIDS-VITAMIN E PO, Take 1 capsule by mouth daily., Disp: , Rfl:  .  omeprazole (PRILOSEC) 20 MG capsule, Take 20 mg by mouth daily., Disp: , Rfl:  .  pravastatin (PRAVACHOL) 20 MG tablet, Take 20 mg by mouth every evening., Disp: , Rfl:  .  pregabalin (LYRICA) 25 MG capsule, Take 25 mg by mouth  2 (two) times daily., Disp: , Rfl:  .  tiotropium (SPIRIVA) 18 MCG inhalation capsule, Place 18 mcg into inhaler and inhale daily., Disp: , Rfl:  .  vitamin C (ASCORBIC ACID) 500 MG tablet, Take 500 mg by mouth daily., Disp: , Rfl:  .  WIXELA INHUB 500-50 MCG/DOSE AEPB, USE 1 INHALATION BY MOUTH TWICE DAILY (Patient taking differently: Inhale 1 puff into the lungs 2 (two) times daily. ), Disp: 180 each, Rfl: 2    Review of Systems  Constitutional: Negative for chills, fever and weight loss.  HENT: Negative for hearing loss.   Eyes: Negative for blurred vision.  Respiratory: Positive for shortness of breath. Negative for cough, sputum production and wheezing.        Improving SOB, now mostly with exertion  Cardiovascular: Positive for claudication. Negative for chest pain, palpitations and orthopnea.  Gastrointestinal: Negative for heartburn and nausea.  Genitourinary: Negative for dysuria.  Musculoskeletal: Positive for myalgias.       Chronic leg pain with exercise or exertion, has PVD/PAD  Skin: Negative for rash.  Neurological: Negative for headaches.     BP 118/72 (BP Location: Left Arm, Cuff Size: Normal)   Pulse 61   Temp 98.1 F (36.7 C) (Oral)   Ht 6' (1.829 m)   Wt 222 lb 3.2 oz (100.8 kg)   SpO2 99%   BMI 30.14 kg/m   Allergies:  Codeine, Hydralazine, and Penicillins  Physical Examination:   General Appearance: No distress  Neuro:without focal findings,  speech normal,  HEENT: PERRLA, EOM intact.   Pulmonary: normal breath sounds, No wheezing.  CardiovascularNormal S1,S2.  No m/r/g.   Abdomen: Benign, Soft, non-tender. Renal:  No costovertebral tenderness  GU:  Not performed at this time. Endoc: No evident thyromegaly Skin:   warm, no rashes, no ecchymosis  Extremities: + Edema PSYCHIATRIC: Mood, affect within normal limits.   ALL OTHER ROS ARE NEGATIVE      6MWT - 02/10/15 - distance: 225m - lowest saturation: 97% - no complaints during walk, no  need for supplemental O2 with exertion  6MWT 09/20/2015 - distance 1135ft - lowest sat  95% - History of peripheral vascular disease and claudication, completed 8.5 minutes of 10 minutes of testing, stopped early due to leg pain  PFTs 02/10/15 postBD FEV1 75% postBD FVC 70% FEV1/FVC 77% RV 147 TLC 126 RV/TLC 110 DLCO - unable toperform Impression - decrease FEV1, preserved FEV1/FVC ratio. Mild/Mod obstruction on inhalers, air trapping/hyperinflation noted.  Significant response to BD, >210ml on FVC and FEV1 post BD   EADIOLOGY: (The following images and results were reviewed by Dr. Stevenson Clinch on 10/22/2019). CT Chest 02/15/16 IMPRESSION:CT chest 01/2016 1. Several new pulmonary nodules since the prior chest CT in June 2016. 2. Patchy area of tree-in-bud appearance in the right upper lobe is  likely inflammation or atypical infection. 3. Probable area of rounded atelectasis in the left lower lobe. 4. Stable emphysematous changes. 5. Advanced atherosclerotic calcifications involving the thoracic and abdominal aorta and branch vessels. 6. Scattered mediastinal and hilar lymph nodes. 7. I do not think PET-CT would be helpful as these nodules are too small. Abdominal/pelvic CT may be helpful to exclude malignancy. If this is negative a three-month follow-up chest CT is suggested.   Repeat CT chest 05/2016 IMPRESSION: 1. Scattered small pulmonary nodules, regarded as new on the prior exam, are smaller or resolved on the current exam. 2. Resolved subpleural consolidation/ground-glass in the left lower lobe with residual scarring. 3. Additional pulmonary nodules are unchanged from 07/15/2014 and considered benign. 4. Aortic atherosclerosis (ICD10-170.0). Coronary artery calcification. 5. Cholelithiasis.  01/2015 PFTs  - preserved FEV1/FVC ratio, airtrapping and hyperinflation, significant response to BD.  6MWt  - 235m, no desats <88%, no need for supplemental O2.   09/2015 103mwt - 335m,  lowest sat 95%, completed 8.5 of 6min, stopped early due to leg pain  02/02/2016: 50mwt - 460m/1378ft, lowest sat 97%, highest HR 80. Again on supplemental O2 for PVD with Claudication (given by Cards and Vasc Surg) and OSA with hypoxemia at night.     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 With CKD and history of CHF with obstructive sleep apnea with chronic hypoxic respiratory failure in the setting of deconditioned state with a history of pulmonary nodules with previous history of bilateral pneumonia with multiple COPD exacerbations and multiple hospital admissions  Patient has a progressive decline in his quality of life due to his COPD exacerbations over the last several years At this time patient has end-stage COPD with chronic hypoxic respiratory failure  Chronic shortness of breath and dyspnea exertion related to multifactorial issues most likely related to the COPD CHF and deconditioned state  Gold stage D COPD No indication for antibiotics or prednisone at this time Continue inhaler therapy as prescribed Continue Wixela and Spiriva Albuterol as needed At some point in time traditional inhaler therapy may not be adequate for this patient Will consider switching to nebulized therapy at some point   Chronic hypoxic respiratory failure from COPD Continue oxygen as prescribed at night and with exertion Patient uses and benefits from oxygen therapy and needs this for survival Will assess patient for portable oxygen concentrator however I did notify the patient that DME companies do not have that in stock  PULM nodules CT of the chest to be obtained at a later point especially in the setting of after any recent COPD exacerbation in the hospital We will consider repeat CT chest in 6 to 9 months  OSA on CPAP Despite being admitted to the hospital patient has good compliance report Patient uses and  benefits from CPAP therapy  Deconditioned state with chronic shortness of breath and dyspnea exertion Recommend pulmonary rehab referral  Obesity -recommend significant weight loss -recommend changing diet  Deconditioned state -Recommend increased daily activity and exercise      COVID-19 EDUCATION: The signs and symptoms of COVID-19 were discussed with the patient and how to seek care for testing (follow up with PCP or arrange E-visit).  The importance of social distancing was discussed today.  MEDICATION ADJUSTMENTS/LABS AND TESTS ORDERED: Continue inhalers as prescribed Continue oxygen as prescribed Continue CPAP as prescribed  CURRENT MEDICATIONS REVIEWED AT LENGTH WITH PATIENT TODAY   Patient/Family are  satisfied with Plan of action and management. All questions answered  Follow up 6 months   Total time spent 47 minutes   Corrin Parker, M.D.  [facility]  Medical Director [place] Director [facility] Cardio-Pulmonary Department

## 2019-10-22 NOTE — Patient Instructions (Signed)
Continue Oxygen therapy Continue CPAP Continue Inhalers participate in PULM rehab  Assess for portable oxygen concentrator

## 2019-10-24 ENCOUNTER — Ambulatory Visit: Payer: Medicare PPO

## 2019-10-24 ENCOUNTER — Telehealth: Payer: Self-pay | Admitting: Internal Medicine

## 2019-10-24 MED ORDER — ALBUTEROL SULFATE HFA 108 (90 BASE) MCG/ACT IN AERS: 2.0000 | INHALATION_SPRAY | RESPIRATORY_TRACT | 3 refills | Status: DC | PRN

## 2019-10-24 NOTE — Telephone Encounter (Signed)
Called and spoke with patient. Verified medication and pharmacy. Refill has been sent. Nothing further needed at this time.

## 2019-10-27 ENCOUNTER — Ambulatory Visit: Payer: Medicare PPO

## 2019-10-29 ENCOUNTER — Ambulatory Visit: Payer: Medicare PPO

## 2019-10-29 ENCOUNTER — Other Ambulatory Visit: Payer: Self-pay

## 2019-10-29 ENCOUNTER — Encounter: Payer: Medicare PPO | Attending: Internal Medicine | Admitting: *Deleted

## 2019-10-29 VITALS — Ht 72.5 in | Wt 223.5 lb

## 2019-10-29 DIAGNOSIS — Z79899 Other long term (current) drug therapy: Secondary | ICD-10-CM | POA: Insufficient documentation

## 2019-10-29 DIAGNOSIS — Z87891 Personal history of nicotine dependence: Secondary | ICD-10-CM | POA: Insufficient documentation

## 2019-10-29 DIAGNOSIS — J449 Chronic obstructive pulmonary disease, unspecified: Secondary | ICD-10-CM

## 2019-10-29 DIAGNOSIS — I5032 Chronic diastolic (congestive) heart failure: Secondary | ICD-10-CM

## 2019-10-29 DIAGNOSIS — I5030 Unspecified diastolic (congestive) heart failure: Secondary | ICD-10-CM | POA: Insufficient documentation

## 2019-10-29 NOTE — Patient Instructions (Signed)
Patient Instructions  Patient Details  Name: Ernest Mclean MRN: [id number] Date of Birth: 1940-09-29 Referring Provider:  Yolonda Kida, MD  Below are your personal goals for exercise, nutrition, and risk factors. Our goal is to help you stay on track towards obtaining and maintaining these goals. We will be discussing your progress on these goals with you throughout the program.  Initial Exercise Prescription: Initial Exercise Prescription - 10/29/19 1100      Date of Initial Exercise RX and Referring Provider   Date  10/29/19    Referring Provider  Lujean Amel MD      Oxygen   Oxygen  Continuous    Liters  2      Recumbant Bike   Level  1    RPM  50    Watts  16    Minutes  15    METs  1.5      NuStep   Level  1    SPM  80    Minutes  15    METs  1.5      Arm Ergometer   Level  1    Watts  23    RPM  25    Minutes  15    METs  1.5      Biostep-RELP   Level  1    SPM  50    Minutes  15    METs  1      Prescription Details   Frequency (times per week)  3    Duration  Progress to 30 minutes of continuous aerobic without signs/symptoms of physical distress      Intensity   THRR 40-80% of Max Heartrate  91-126    Ratings of Perceived Exertion  11-13    Perceived Dyspnea  0-4      Progression   Progression  Continue to progress workloads to maintain intensity without signs/symptoms of physical distress.      Resistance Training   Training Prescription  Yes    Weight  3 lb    Reps  10-15       Exercise Goals: Frequency: Be able to perform aerobic exercise two to three times per week in program working toward 2-5 days per week of home exercise.  Intensity: Work with a perceived exertion of 11 (fairly light) - 15 (hard) while following your exercise prescription.  We will make changes to your prescription with you as you progress through the program.   Duration: Be able to do 30 to 45 minutes of continuous aerobic exercise in addition to a 5  minute warm-up and a 5 minute cool-down routine.   Nutrition Goals: Your personal nutrition goals will be established when you do your nutrition analysis with the dietician.  The following are general nutrition guidelines to follow: Cholesterol < 200mg /day Sodium < 1500mg /day Fiber: Men over 50 yrs - 30 grams per day  Personal Goals: Personal Goals and Risk Factors at Admission - 10/29/19 1128      Core Components/Risk Factors/Patient Goals on Admission    Weight Management  Yes;Weight Loss    Intervention  Weight Management: Develop a combined nutrition and exercise program designed to reach desired caloric intake, while maintaining appropriate intake of nutrient and fiber, sodium and fats, and appropriate energy expenditure required for the weight goal.;Weight Management: Provide education and appropriate resources to help participant work on and attain dietary goals.    Admit Weight  223 lb 8 oz (101.4 kg)  Goal Weight: Short Term  218 lb (98.9 kg)    Goal Weight: Long Term  213 lb (96.6 kg)    Expected Outcomes  Long Term: Adherence to nutrition and physical activity/exercise program aimed toward attainment of established weight goal;Short Term: Continue to assess and modify interventions until short term weight is achieved;Weight Loss: Understanding of general recommendations for a balanced deficit meal plan, which promotes 1-2 lb weight loss per week and includes a negative energy balance of 817 160 0125 kcal/d;Understanding recommendations for meals to include 15-35% energy as protein, 25-35% energy from fat, 35-60% energy from carbohydrates, less than 200mg  of dietary cholesterol, 20-35 gm of total fiber daily;Understanding of distribution of calorie intake throughout the day with the consumption of 4-5 meals/snacks    Improve shortness of breath with ADL's  Yes    Intervention  Provide education, individualized exercise plan and daily activity instruction to help decrease symptoms of SOB  with activities of daily living.    Expected Outcomes  Short Term: Improve cardiorespiratory fitness to achieve a reduction of symptoms when performing ADLs;Long Term: Be able to perform more ADLs without symptoms or delay the onset of symptoms    Diabetes  Yes    Intervention  Provide education about signs/symptoms and action to take for hypo/hyperglycemia.;Provide education about proper nutrition, including hydration, and aerobic/resistive exercise prescription along with prescribed medications to achieve blood glucose in normal ranges: Fasting glucose 65-99 mg/dL    Expected Outcomes  Short Term: Participant verbalizes understanding of the signs/symptoms and immediate care of hyper/hypoglycemia, proper foot care and importance of medication, aerobic/resistive exercise and nutrition plan for blood glucose control.;Long Term: Attainment of HbA1C < 7%.    Heart Failure  Yes    Intervention  Provide a combined exercise and nutrition program that is supplemented with education, support and counseling about heart failure. Directed toward relieving symptoms such as shortness of breath, decreased exercise tolerance, and extremity edema.    Expected Outcomes  Improve functional capacity of life;Short term: Attendance in program 2-3 days a week with increased exercise capacity. Reported lower sodium intake. Reported increased fruit and vegetable intake. Reports medication compliance.;Short term: Daily weights obtained and reported for increase. Utilizing diuretic protocols set by physician.;Long term: Adoption of self-care skills and reduction of barriers for early signs and symptoms recognition and intervention leading to self-care maintenance.    Hypertension  Yes    Intervention  Provide education on lifestyle modifcations including regular physical activity/exercise, weight management, moderate sodium restriction and increased consumption of fresh fruit, vegetables, and low fat dairy, alcohol moderation, and  smoking cessation.;Monitor prescription use compliance.    Expected Outcomes  Short Term: Continued assessment and intervention until BP is < 140/84mm HG in hypertensive participants. < 130/63mm HG in hypertensive participants with diabetes, heart failure or chronic kidney disease.;Long Term: Maintenance of blood pressure at goal levels.    Lipids  Yes    Intervention  Provide education and support for participant on nutrition & aerobic/resistive exercise along with prescribed medications to achieve LDL 70mg , HDL >40mg .    Expected Outcomes  Short Term: Participant states understanding of desired cholesterol values and is compliant with medications prescribed. Participant is following exercise prescription and nutrition guidelines.;Long Term: Cholesterol controlled with medications as prescribed, with individualized exercise RX and with personalized nutrition plan. Value goals: LDL < 70mg , HDL > 40 mg.       Tobacco Use Initial Evaluation: Social History   Tobacco Use  Smoking Status Former Smoker  . Packs/day:  1.00  . Years: 30.00  . Pack years: 30.00  . Types: Cigarettes  . Quit date: 12/14/2001  . Years since quitting: 17.8  Smokeless Tobacco Never Used  Tobacco Comment   quit 12/14/2001    Exercise Goals and Review: Exercise Goals    Row Name 10/29/19 1125             Exercise Goals   Increase Physical Activity  Yes       Intervention  Provide advice, education, support and counseling about physical activity/exercise needs.;Develop an individualized exercise prescription for aerobic and resistive training based on initial evaluation findings, risk stratification, comorbidities and participant's personal goals.       Expected Outcomes  Short Term: Attend rehab on a regular basis to increase amount of physical activity.;Long Term: Add in home exercise to make exercise part of routine and to increase amount of physical activity.;Long Term: Exercising regularly at least 3-5 days a  week.       Increase Strength and Stamina  Yes       Intervention  Provide advice, education, support and counseling about physical activity/exercise needs.;Develop an individualized exercise prescription for aerobic and resistive training based on initial evaluation findings, risk stratification, comorbidities and participant's personal goals.       Expected Outcomes  Short Term: Increase workloads from initial exercise prescription for resistance, speed, and METs.;Short Term: Perform resistance training exercises routinely during rehab and add in resistance training at home;Long Term: Improve cardiorespiratory fitness, muscular endurance and strength as measured by increased METs and functional capacity (6MWT)       Able to understand and use rate of perceived exertion (RPE) scale  Yes       Intervention  Provide education and explanation on how to use RPE scale       Expected Outcomes  Long Term:  Able to use RPE to guide intensity level when exercising independently;Short Term: Able to use RPE daily in rehab to express subjective intensity level       Able to understand and use Dyspnea scale  Yes       Intervention  Provide education and explanation on how to use Dyspnea scale       Expected Outcomes  Short Term: Able to use Dyspnea scale daily in rehab to express subjective sense of shortness of breath during exertion;Long Term: Able to use Dyspnea scale to guide intensity level when exercising independently       Knowledge and understanding of Target Heart Rate Range (THRR)  Yes       Intervention  Provide education and explanation of THRR including how the numbers were predicted and where they are located for reference       Expected Outcomes  Short Term: Able to state/look up THRR;Short Term: Able to use daily as guideline for intensity in rehab;Long Term: Able to use THRR to govern intensity when exercising independently       Able to check pulse independently  Yes       Intervention  Provide  education and demonstration on how to check pulse in carotid and radial arteries.;Review the importance of being able to check your own pulse for safety during independent exercise       Expected Outcomes  Short Term: Able to explain why pulse checking is important during independent exercise;Long Term: Able to check pulse independently and accurately       Understanding of Exercise Prescription  Yes       Intervention  Provide education, explanation, and written materials on patient's individual exercise prescription       Expected Outcomes  Short Term: Able to explain program exercise prescription;Long Term: Able to explain home exercise prescription to exercise independently       Improve claudication pain toleration; Improve walking ability  Yes       Intervention  Attend education sessions to aid in risk factor modification and understanding of disease process       Expected Outcomes  Short Term: Improve walking distance/time to onset of claudication pain;Long Term: Improve walking ability and toleration to claudication          Copy of goals given to participant.

## 2019-10-29 NOTE — Progress Notes (Signed)
Pulmonary Individual Treatment Plan  Patient Details  Name: Ernest Mclean MRN: [id number] Date of Birth: 14-Apr-1941 Referring Provider:     Pulmonary Rehab from 10/29/2019 in [facility] Cardiac and Pulmonary Rehab  Referring Provider  Lujean Amel MD      Initial Encounter Date:    Pulmonary Rehab from 10/29/2019 in [facility] Cardiac and Pulmonary Rehab  Date  10/29/19      Visit Diagnosis: Heart failure, diastolic, chronic (HCC)  Chronic obstructive pulmonary disease, unspecified COPD type ([place])  Patient's Home Medications on Admission:  Current Outpatient Medications:  .  albuterol (VENTOLIN HFA) 108 (90 Base) MCG/ACT inhaler, Inhale 2 puffs into the lungs every 4 (four) hours as needed for wheezing or shortness of breath., Disp: 18 g, Rfl: 3 .  amLODipine (NORVASC) 5 MG tablet, Take 5 mg by mouth daily. , Disp: , Rfl:  .  apixaban (ELIQUIS) 5 MG TABS tablet, Take 2.5 mg by mouth 2 (two) times daily. , Disp: , Rfl:  .  aspirin EC 81 MG tablet, Take 81 mg by mouth daily., Disp: , Rfl:  .  atorvastatin (LIPITOR) 20 MG tablet, Take by mouth., Disp: , Rfl:  .  BD INSULIN SYRINGE U/F 31G X 5/16" 0.5 ML MISC, , Disp: , Rfl:  .  citalopram (CELEXA) 20 MG tablet, Take 20 mg by mouth at bedtime. , Disp: , Rfl: 5 .  CRANBERRY PO, Take 820 mg by mouth daily., Disp: , Rfl:  .  cyanocobalamin 500 MCG tablet, Take 500 mcg by mouth daily., Disp: , Rfl:  .  diazepam (VALIUM) 5 MG tablet, Take 5 mg by mouth every 12 (twelve) hours as needed for anxiety. , Disp: , Rfl:  .  ergocalciferol (VITAMIN D2) 50000 units capsule, Take 50,000 Units by mouth every 30 (thirty) days., Disp: , Rfl:  .  furosemide (LASIX) 20 MG tablet, Take 20 mg by mouth daily. , Disp: , Rfl:  .  HUMALOG 100 UNIT/ML injection, Inject 10-25 Units into the skin 3 (three) times daily with meals. (sliding scale as needed to a maximum of 150u daily), Disp: , Rfl:  .  insulin glargine (LANTUS) 100 UNIT/ML injection, Inject 60 Units into the  skin at bedtime. , Disp: , Rfl:  .  lisinopril (PRINIVIL,ZESTRIL) 5 MG tablet, Take 5 mg by mouth daily., Disp: , Rfl:  .  metFORMIN (GLUCOPHAGE-XR) 500 MG 24 hr tablet, Take 1,000 mg by mouth daily with supper., Disp: , Rfl:  .  metoprolol tartrate (LOPRESSOR) 100 MG tablet, Take 0.5 tablets (50 mg total) by mouth 2 (two) times daily. This is a decrease from your previous 100 mg twice a day., Disp: , Rfl: 0 .  OMEGA-3 FATTY ACIDS-VITAMIN E PO, Take 1 capsule by mouth daily., Disp: , Rfl:  .  omeprazole (PRILOSEC) 20 MG capsule, Take 20 mg by mouth daily., Disp: , Rfl:  .  pravastatin (PRAVACHOL) 20 MG tablet, Take 20 mg by mouth every evening., Disp: , Rfl:  .  pregabalin (LYRICA) 25 MG capsule, Take 25 mg by mouth 2 (two) times daily., Disp: , Rfl:  .  tiotropium (SPIRIVA) 18 MCG inhalation capsule, Place 18 mcg into inhaler and inhale daily., Disp: , Rfl:  .  vitamin C (ASCORBIC ACID) 500 MG tablet, Take 500 mg by mouth daily., Disp: , Rfl:  .  WIXELA INHUB 500-50 MCG/DOSE AEPB, USE 1 INHALATION BY MOUTH TWICE DAILY (Patient taking differently: Inhale 1 puff into the lungs 2 (two) times daily. ), Disp:  180 each, Rfl: 2  Past Medical History: Past Medical History:  Diagnosis Date  . Atrial fibrillation ([place])   . Basal cell carcinoma 06/2017   Left Ear  . CAD (coronary artery disease)   . CKD (chronic kidney disease)   . Congestive heart failure ([place])   . COPD (chronic obstructive pulmonary disease) ([place])   . Diabetes ([place])   . Hypertension   . OSA on CPAP   . 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 Packs/day: 1.00  . Years: 30.00  . Pack years: 30.00  . Types: Cigarettes  . Quit date: 12/14/2001  . Years since quitting: 17.8  Smokeless Tobacco Never Used  Tobacco Comment   quit 12/14/2001    Labs: Recent Review Flowsheet Data    Labs for ITP Cardiac and Pulmonary Rehab Latest Ref Rng &  Units 06/23/2014 05/13/2015 08/01/2016 01/03/2019 09/28/2019   Cholestrol 0 - 200 mg/dL - - - - 101   LDLCALC 0 - 99 mg/dL - - - - 60   HDL >40 mg/dL - - - - 28(L)   Trlycerides <150 mg/dL - - - - 67   Hemoglobin A1c 4.8 - 5.6 % 10.8(H) 7.9(H) 8.4(H) 7.9(H) 8.4(H)   HCO3 20.0 - 28.0 mmol/L - - 32.4(H) 27.2 -   O2SAT % - - - 79.7 -       Pulmonary Assessment Scores: Pulmonary Assessment Scores    Row Name 10/29/19 1130         ADL UCSD   ADL Phase  Entry     SOB Score total  30     Rest  0     Walk  3     Stairs  2     Bath  0     Dress  0     Shop  1       CAT Score   CAT Score  7       mMRC Score   mMRC Score  3        UCSD: Self-administered rating of dyspnea associated with activities of daily living (ADLs) 6-point scale (0 = "not at all" to 5 = "maximal or unable to do because of breathlessness")  Scoring Scores range from 0 to 120.  Minimally important difference is 5 units  CAT: CAT can identify the health impairment of COPD patients and is better correlated with disease progression.  CAT has a scoring range of zero to 40. The CAT score is classified into four groups of low (less than 10), medium (10 - 20), high (21-30) and very high (31-40) based on the impact level of disease on health status. A CAT score over 10 suggests significant symptoms.  A worsening CAT score could be explained by an exacerbation, poor medication adherence, poor inhaler technique, or progression of COPD or comorbid conditions.  CAT MCID is 2 points  mMRC: mMRC (Modified Medical Research Council) Dyspnea Scale is used to assess the degree of baseline functional disability in patients of respiratory disease due to dyspnea. No minimal important difference is established. A decrease in score of 1 point or greater is considered a positive change.   Pulmonary Function Assessment: Pulmonary Function Assessment - 10/16/19 0938      Breath   Shortness of Breath  Yes;Limiting activity        Exercise Target Goals: Exercise Program Goal: Individual exercise  prescription set using results from initial 6 min walk test and THRR while considering  patient's activity barriers and safety.   Exercise Prescription Goal: Initial exercise prescription builds to 30-45 minutes a day of aerobic activity, 2-3 days per week.  Home exercise guidelines will be given to patient during program as part of exercise prescription that the participant will acknowledge.  Education: Aerobic Exercise & Resistance Training: - Gives group verbal and written instruction on the various components of exercise. Focuses on aerobic and resistive training programs and the benefits of this training and how to safely progress through these programs..   Pulmonary Rehab from 07/10/2018 in [facility] Cardiac and Pulmonary Rehab  Date  06/14/18  Educator  [facility]  Instruction Review Code  1- Verbalizes Understanding      Education: Exercise & Equipment Safety: - Individual verbal instruction and demonstration of equipment use and safety with use of the equipment.   Pulmonary Rehab from 10/29/2019 in [facility] Cardiac and Pulmonary Rehab  Date  10/16/19  Educator  [facility]  Instruction Review Code  1- Verbalizes Understanding      Education: Exercise Physiology & General Exercise Guidelines: - Group verbal and written instruction with models to review the exercise physiology of the cardiovascular system and associated critical values. Provides general exercise guidelines with specific guidelines to those with heart or lung disease.    Pulmonary Rehab from 07/10/2018 in [facility] Cardiac and Pulmonary Rehab  Date  06/12/18  Educator  [facility]  Instruction Review Code  1- Verbalizes Understanding      Education: Flexibility, Balance, Mind/Body Relaxation: Provides group verbal/written instruction on the benefits of flexibility and balance training, including mind/body exercise modes such as yoga, pilates and tai chi.  Demonstration and skill  practice provided.   Pulmonary Rehab from 07/10/2018 in [facility] Cardiac and Pulmonary Rehab  Date  06/19/18  Educator  [facility]  Instruction Review Code  1- Verbalizes Understanding      Activity Barriers & Risk Stratification: Activity Barriers & Cardiac Risk Stratification - 10/29/19 1121      Activity Barriers & Cardiac Risk Stratification   Activity Barriers  Arthritis;Shortness of Breath;Balance Concerns;Deconditioning;Muscular Weakness       6 Minute Walk: 6 Minute Walk    Row Name 10/29/19 1103         6 Minute Walk   Phase  Initial     Distance  815 feet     Walk Time  5.88 minutes     # of Rest Breaks  1 7 sec     MPH  1.57     METS  1.5     RPE  15     Perceived Dyspnea   3     VO2 Peak  5.23     Symptoms  Yes (comment)     Comments  calf pain 8/10     Resting HR  56 bpm     Resting BP  108/64     Resting Oxygen Saturation   96 %     Exercise Oxygen Saturation  during 6 min walk  88 %     Max Ex. HR  88 bpm     Max Ex. BP  134/56     2 Minute Post BP  128/70       Interval HR   1 Minute HR  64     2 Minute HR  66     3 Minute HR  72     4 Minute HR  81     5 Minute HR  72     6 Minute HR  88     2 Minute Post HR  67     Interval Heart Rate?  Yes       Interval Oxygen   Interval Oxygen?  Yes     Baseline Oxygen Saturation %  96 %     1 Minute Oxygen Saturation %  89 %     1 Minute Liters of Oxygen  2 L continuous     2 Minute Oxygen Saturation %  88 %     2 Minute Liters of Oxygen  2 L     3 Minute Oxygen Saturation %  88 %     3 Minute Liters of Oxygen  2 L     4 Minute Oxygen Saturation %  89 %     4 Minute Liters of Oxygen  2 L     5 Minute Oxygen Saturation %  90 %     5 Minute Liters of Oxygen  0 L     6 Minute Oxygen Saturation %  89 %     6 Minute Liters of Oxygen  0 L     2 Minute Post Oxygen Saturation %  96 %     2 Minute Post Liters of Oxygen  2 L       Oxygen Initial Assessment: Oxygen Initial Assessment - 10/16/19 0937      Home  Oxygen   Home Oxygen Device  Home Concentrator;Portable Concentrator;E-Tanks    Sleep Oxygen Prescription  Continuous;CPAP    Liters per minute  2    Home Exercise Oxygen Prescription  Continuous    Liters per minute  2    Home at Rest Exercise Oxygen Prescription  None    Compliance with Home Oxygen Use  Yes      Initial 6 min Walk   Oxygen Used  Continuous    Liters per minute  2      Program Oxygen Prescription   Program Oxygen Prescription  Continuous    Liters per minute  2      Intervention   Short Term Goals  To learn and exhibit compliance with exercise, home and travel O2 prescription;To learn and understand importance of monitoring SPO2 with pulse oximeter and demonstrate accurate use of the pulse oximeter.;To learn and understand importance of maintaining oxygen saturations>88%;To learn and demonstrate proper pursed lip breathing techniques or other breathing techniques.;To learn and demonstrate proper use of respiratory medications    Long  Term Goals  Exhibits compliance with exercise, home and travel O2 prescription;Verbalizes importance of monitoring SPO2 with pulse oximeter and return demonstration;Maintenance of O2 saturations>88%;Exhibits proper breathing techniques, such as pursed lip breathing or other method taught during program session;Compliance with respiratory medication;Demonstrates proper use of MDI's       Oxygen Re-Evaluation:   Oxygen Discharge (Final Oxygen Re-Evaluation):   Initial Exercise Prescription: Initial Exercise Prescription - 10/29/19 1100      Date of Initial Exercise RX and Referring Provider   Date  10/29/19    Referring Provider  Lujean Amel MD      Oxygen   Oxygen  Continuous    Liters  2      Recumbant Bike   Level  1    RPM  50    Watts  16    Minutes  15    METs  1.5      NuStep  Level  1    SPM  80    Minutes  15    METs  1.5      Arm Ergometer   Level  1    Watts  23    RPM  25    Minutes  15     METs  1.5      Biostep-RELP   Level  1    SPM  50    Minutes  15    METs  1      Prescription Details   Frequency (times per week)  3    Duration  Progress to 30 minutes of continuous aerobic without signs/symptoms of physical distress      Intensity   THRR 40-80% of Max Heartrate  91-126    Ratings of Perceived Exertion  11-13    Perceived Dyspnea  0-4      Progression   Progression  Continue to progress workloads to maintain intensity without signs/symptoms of physical distress.      Resistance Training   Training Prescription  Yes    Weight  3 lb    Reps  10-15       Perform Capillary Blood Glucose checks as needed.  Exercise Prescription Changes: Exercise Prescription Changes    Row Name 10/29/19 1100             Response to Exercise   Blood Pressure (Admit)  108/64       Blood Pressure (Exercise)  134/56       Blood Pressure (Exit)  124/66       Heart Rate (Admit)  56 bpm       Heart Rate (Exercise)  81 bpm       Heart Rate (Exit)  61 bpm       Oxygen Saturation (Admit)  96 %       Oxygen Saturation (Exercise)  88 %       Oxygen Saturation (Exit)  97 %       Rating of Perceived Exertion (Exercise)  15       Perceived Dyspnea (Exercise)  3       Symptoms  calf pain 8/10       Comments  walk test results          Exercise Comments:   Exercise Goals and Review: Exercise Goals    Row Name 10/29/19 1125             Exercise Goals   Increase Physical Activity  Yes       Intervention  Provide advice, education, support and counseling about physical activity/exercise needs.;Develop an individualized exercise prescription for aerobic and resistive training based on initial evaluation findings, risk stratification, comorbidities and participant's personal goals.       Expected Outcomes  Short Term: Attend rehab on a regular basis to increase amount of physical activity.;Long Term: Add in home exercise to make exercise part of routine and to increase  amount of physical activity.;Long Term: Exercising regularly at least 3-5 days a week.       Increase Strength and Stamina  Yes       Intervention  Provide advice, education, support and counseling about physical activity/exercise needs.;Develop an individualized exercise prescription for aerobic and resistive training based on initial evaluation findings, risk stratification, comorbidities and participant's personal goals.       Expected Outcomes  Short Term: Increase workloads from initial exercise prescription for resistance, speed, and METs.;Short Term: Perform resistance  training exercises routinely during rehab and add in resistance training at home;Long Term: Improve cardiorespiratory fitness, muscular endurance and strength as measured by increased METs and functional capacity (6MWT)       Able to understand and use rate of perceived exertion (RPE) scale  Yes       Intervention  Provide education and explanation on how to use RPE scale       Expected Outcomes  Long Term:  Able to use RPE to guide intensity level when exercising independently;Short Term: Able to use RPE daily in rehab to express subjective intensity level       Able to understand and use Dyspnea scale  Yes       Intervention  Provide education and explanation on how to use Dyspnea scale       Expected Outcomes  Short Term: Able to use Dyspnea scale daily in rehab to express subjective sense of shortness of breath during exertion;Long Term: Able to use Dyspnea scale to guide intensity level when exercising independently       Knowledge and understanding of Target Heart Rate Range (THRR)  Yes       Intervention  Provide education and explanation of THRR including how the numbers were predicted and where they are located for reference       Expected Outcomes  Short Term: Able to state/look up THRR;Short Term: Able to use daily as guideline for intensity in rehab;Long Term: Able to use THRR to govern intensity when exercising  independently       Able to check pulse independently  Yes       Intervention  Provide education and demonstration on how to check pulse in carotid and radial arteries.;Review the importance of being able to check your own pulse for safety during independent exercise       Expected Outcomes  Short Term: Able to explain why pulse checking is important during independent exercise;Long Term: Able to check pulse independently and accurately       Understanding of Exercise Prescription  Yes       Intervention  Provide education, explanation, and written materials on patient's individual exercise prescription       Expected Outcomes  Short Term: Able to explain program exercise prescription;Long Term: Able to explain home exercise prescription to exercise independently       Improve claudication pain toleration; Improve walking ability  Yes       Intervention  Attend education sessions to aid in risk factor modification and understanding of disease process       Expected Outcomes  Short Term: Improve walking distance/time to onset of claudication pain;Long Term: Improve walking ability and toleration to claudication          Exercise Goals Re-Evaluation :   Discharge Exercise Prescription (Final Exercise Prescription Changes): Exercise Prescription Changes - 10/29/19 1100      Response to Exercise   Blood Pressure (Admit)  108/64    Blood Pressure (Exercise)  134/56    Blood Pressure (Exit)  124/66    Heart Rate (Admit)  56 bpm    Heart Rate (Exercise)  81 bpm    Heart Rate (Exit)  61 bpm    Oxygen Saturation (Admit)  96 %    Oxygen Saturation (Exercise)  88 %    Oxygen Saturation (Exit)  97 %    Rating of Perceived Exertion (Exercise)  15    Perceived Dyspnea (Exercise)  3    Symptoms  calf pain  8/10    Comments  walk test results       Nutrition:  Target Goals: Understanding of nutrition guidelines, daily intake of sodium <152m, cholesterol <207m calories 30% from fat and 7% or  less from saturated fats, daily to have 5 or more servings of fruits and vegetables.  Education: Controlling Sodium/Reading Food Labels -Group verbal and written material supporting the discussion of sodium use in heart healthy nutrition. Review and explanation with models, verbal and written materials for utilization of the food label.   [facility]  Date  07/10/18  [facility]  [facility] Code  1- Verbalizes Understanding      Education: General Nutrition Guidelines/Fats and Fiber: -Group instruction provided by verbal, written material, models and posters to present the general guidelines for heart healthy nutrition. Gives an explanation and review of dietary fats and fiber.   [facility]  Date  07/03/18  Educator  [facility] Review Code  1- Verbalizes Understanding      Biometrics: Pre Biometrics - 10/29/19 1126      Pre Biometrics   Height  6' 0.5" (1.842 m)    Weight  223 lb 8 oz (101.4 kg)    BMI (Calculated)  29.88    Single Leg Stand  0 seconds        Nutrition Therapy Plan and Nutrition Goals:   Nutrition Assessments: Nutrition Assessments - 10/29/19 1127      MEDFICTS Scores   Pre Score  42       MEDIFICTS Score Key:          ?70 Need to make dietary changes          40-70 Heart Healthy Diet         ? 40 Therapeutic Level Cholesterol Diet  Nutrition Goals Re-Evaluation:   Nutrition Goals Discharge (Final Nutrition Goals Re-Evaluation):   Psychosocial: Target Goals: Acknowledge presence or absence of significant depression and/or stress, maximize coping skills, provide positive support system. Participant is able to verbalize types and ability to use techniques and skills needed for reducing stress and depression.   Education: Depression - Provides group verbal and written instruction on the correlation between heart/lung  disease and depressed mood, treatment options, and the stigmas associated with seeking treatment.   Education: Sleep Hygiene -Provides group verbal and written instruction about how sleep can affect your health.  Define sleep hygiene, discuss sleep cycles and impact of sleep habits. Review good sleep hygiene tips.    [facility]  Date  05/29/18  Educator  [facility]Instruction Review Code  1- Verbalizes Understanding      Education: Stress and Anxiety: - Provides group verbal and written instruction about the health risks of elevated stress and causes of high stress.  Discuss the correlation between heart/lung disease and anxiety and treatment options. Review healthy ways to manage with stress and anxiety.   [facility]  Date  06/26/18  Educator  [facility]Inst[facility] Review Code  1- Verbalizes Understanding      Initial Review & Psychosocial Screening: Initial Psych Review & Screening - 10/16/19 0938      Initial Review   Current issues with  Current Anxiety/Panic;History of Depression      Family Dynamics   Good Support System?  Yes  Comments  Chae has some anxiety and everything that he has been through with his healthcan make him feel that way. He has got over his fear of dying and is not depressed like he used to me.      Barriers   Psychosocial barriers to participate in program  The patient should benefit from training in stress management and relaxation.      Screening Interventions   Interventions  Encouraged to exercise;Provide feedback about the scores to participant;To provide support and resources with identified psychosocial needs    Expected Outcomes  Short Term goal: Utilizing psychosocial counselor, staff and physician to assist with identification of specific Stressors or current issues interfering with healing process. Setting desired goal for each stressor or  current issue identified.;Long Term Goal: Stressors or current issues are controlled or eliminated.;Short Term goal: Identification and review with participant of any Quality of Life or Depression concerns found by scoring the questionnaire.;Long Term goal: The participant improves quality of Life and PHQ9 Scores as seen by post scores and/or verbalization of changes       Quality of Life Scores:  Scores of 19 and below usually indicate a poorer quality of life in these areas.  A difference of  2-3 points is a clinically meaningful difference.  A difference of 2-3 points in the total score of the Quality of Life Index has been associated with significant improvement in overall quality of life, self-image, physical symptoms, and general health in studies assessing change in quality of life.  PHQ-9: Recent Review Flowsheet Data    Depression screen [facility] 2/9 10/29/2019 04/22/2018 04/01/2018 07/16/2017 03/19/2017   Decreased Interest 1 0 0 0 0   Down, Depressed, Hopeless 1 0 0 0 0   PHQ - 2 Score 2 0 0 0 0   Altered sleeping 0 0 0 - -   Tired, decreased energy 1 0 1 - -   Change in appetite 0 0 0 - -   Feeling bad or failure about yourself  1 0 0 - -   Trouble concentrating 0 0 0 - -   Moving slowly or fidgety/restless 0 0 0 - -   Suicidal thoughts 0 0 0 - -   PHQ-9 Score 4 0 1 - -   Difficult doing work/chores Not difficult at all Not difficult at all Not difficult at all - -     Interpretation of Total Score  Total Score Depression Severity:  1-4 = Minimal depression, 5-9 = Mild depression, 10-14 = Moderate depression, 15-19 = Moderately severe depression, 20-27 = Severe depression   Psychosocial Evaluation and Intervention: Psychosocial Evaluation - 10/16/19 0940      Psychosocial Evaluation & Interventions   Interventions  Encouraged to exercise with the program and follow exercise prescription    Comments  Raidyn has some anxiety and everything that he has been through with his healthcan  make him feel that way. He has got over his fear of dying and is not depressed like he used to me.    Expected Outcomes  Short: Start [facility] to help with mood. Long: Maintain a healthy mental state.    Continue Psychosocial Services   Follow up required by staff       Psychosocial Re-Evaluation:   Psychosocial Discharge (Final Psychosocial Re-Evaluation):   Education: Education Goals: Education classes will be provided on a weekly basis, covering required topics. Participant will state understanding/return demonstration of topics presented.  Learning Barriers/Preferences: Learning Barriers/Preferences - 10/16/19 0940  Learning Barriers/Preferences   Learning Barriers  Sight    Learning Preferences  None       General Pulmonary Education Topics:  Infection Prevention: - Provides verbal and written material to individual with discussion of infection control including proper hand washing and proper equipment cleaning during exercise session.   Pulmonary Rehab from 10/29/2019 in [facility] Cardiac and Pulmonary Rehab  Date  10/16/19  Educator  [facility]  Instruction Review Code  1- Verbalizes Understanding      Falls Prevention: - Provides verbal and written material to individual with discussion of falls prevention and safety.   Pulmonary Rehab from 10/29/2019 in [facility] Cardiac and Pulmonary Rehab  Date  10/16/19  Educator  [facility]  Instruction Review Code  1- Verbalizes Understanding      Chronic Lung Diseases: - Group verbal and written instruction to review updates, respiratory medications, advancements in procedures and treatments. Discuss use of supplemental oxygen including available portable oxygen systems, continuous and intermittent flow rates, concentrators, personal use and safety guidelines. Review proper use of inhaler and spacers. Provide informative websites for self-education.    Pulmonary Rehab from 07/10/2018 in [facility] Cardiac and Pulmonary Rehab  Date  06/28/18  Educator   [facility]  Instruction Review Code  1- Verbalizes Understanding      Energy Conservation: - Provide group verbal and written instruction for methods to conserve energy, plan and organize activities. Instruct on pacing techniques, use of adaptive equipment and posture/positioning to relieve shortness of breath.   Triggers and Exacerbations: - Group verbal and written instruction to review types of environmental triggers and ways to prevent exacerbations. Discuss weather changes, air quality and the benefits of nasal washing. Review warning signs and symptoms to help prevent infections. Discuss techniques for effective airway clearance, coughing, and vibrations.   Pulmonary Rehab from 07/10/2018 in [facility] Cardiac and Pulmonary Rehab  Date  05/10/18  Educator  [facility]  Instruction Review Code  1- Verbalizes Understanding      AED/CPR: - Group verbal and written instruction with the use of models to demonstrate the basic use of the AED with the basic ABC's of resuscitation.   Anatomy and Physiology of the Lungs: - Group verbal and written instruction with the use of models to provide basic lung anatomy and physiology related to function, structure and complications of lung disease.   Pulmonary Rehab from 07/10/2018 in [facility] Cardiac and Pulmonary Rehab  Date  04/05/18  Educator  [facility]  Instruction Review Code  1- Verbalizes Understanding      Anatomy & Physiology of the Heart: - Group verbal and written instruction and models provide basic cardiac anatomy and physiology, with the coronary electrical and arterial systems. Review of Valvular disease and Heart Failure   Pulmonary Rehab from 07/10/2018 in [facility] Cardiac and Pulmonary Rehab  Date  05/24/18  Educator  [facility]  Instruction Review Code  1- Verbalizes Understanding      Cardiac Medications: - Group verbal and written instruction to review commonly prescribed medications for heart disease. Reviews the medication, class of the drug, and side effects.    Pulmonary Rehab from 07/10/2018 in [facility] Cardiac and Pulmonary Rehab  Date  05/31/18  Educator  [facility]  Instruction Review Code  1- Verbalizes Understanding      Other: -Provides group and verbal instruction on various topics (see comments)   Knowledge Questionnaire Score: Knowledge Questionnaire Score - 10/29/19 1128      Knowledge Questionnaire Score   Pre Score  17/18 Education Focus: O2 safety  Core Components/Risk Factors/Patient Goals at Admission: Personal Goals and Risk Factors at Admission - 10/29/19 1128      Core Components/Risk Factors/Patient Goals on Admission    Weight Management  Yes;Weight Loss    Intervention  Weight Management: Develop a combined nutrition and exercise program designed to reach desired caloric intake, while maintaining appropriate intake of nutrient and fiber, sodium and fats, and appropriate energy expenditure required for the weight goal.;Weight Management: Provide education and appropriate resources to help participant work on and attain dietary goals.    Admit Weight  223 lb 8 oz (101.4 kg)    Goal Weight: Short Term  218 lb (98.9 kg)    Goal Weight: Long Term  213 lb (96.6 kg)    Expected Outcomes  Long Term: Adherence to nutrition and physical activity/exercise program aimed toward attainment of established weight goal;Short Term: Continue to assess and modify interventions until short term weight is achieved;Weight Loss: Understanding of general recommendations for a balanced deficit meal plan, which promotes 1-2 lb weight loss per week and includes a negative energy balance of [phone] kcal/d;Understanding recommendations for meals to include 15-35% energy as protein, 25-35% energy from fat, 35-60% energy from carbohydrates, less than 273m of dietary cholesterol, 20-35 gm of total fiber daily;Understanding of distribution of calorie intake throughout the day with the consumption of 4-5 meals/snacks    Improve shortness of breath with ADL's   Yes    Intervention  Provide education, individualized exercise plan and daily activity instruction to help decrease symptoms of SOB with activities of daily living.    Expected Outcomes  Short Term: Improve cardiorespiratory fitness to achieve a reduction of symptoms when performing ADLs;Long Term: Be able to perform more ADLs without symptoms or delay the onset of symptoms    Diabetes  Yes    Intervention  Provide education about signs/symptoms and action to take for hypo/hyperglycemia.;Provide education about proper nutrition, including hydration, and aerobic/resistive exercise prescription along with prescribed medications to achieve blood glucose in normal ranges: Fasting glucose 65-99 mg/dL    Expected Outcomes  Short Term: Participant verbalizes understanding of the signs/symptoms and immediate care of hyper/hypoglycemia, proper foot care and importance of medication, aerobic/resistive exercise and nutrition plan for blood glucose control.;Long Term: Attainment of HbA1C < 7%.    Heart Failure  Yes    Intervention  Provide a combined exercise and nutrition program that is supplemented with education, support and counseling about heart failure. Directed toward relieving symptoms such as shortness of breath, decreased exercise tolerance, and extremity edema.    Expected Outcomes  Improve functional capacity of life;Short term: Attendance in program 2-3 days a week with increased exercise capacity. Reported lower sodium intake. Reported increased fruit and vegetable intake. Reports medication compliance.;Short term: Daily weights obtained and reported for increase. Utilizing diuretic protocols set by physician.;Long term: Adoption of self-care skills and reduction of barriers for early signs and symptoms recognition and intervention leading to self-care maintenance.    Hypertension  Yes    Intervention  Provide education on lifestyle modifcations including regular physical activity/exercise, weight  management, moderate sodium restriction and increased consumption of fresh fruit, vegetables, and low fat dairy, alcohol moderation, and smoking cessation.;Monitor prescription use compliance.    Expected Outcomes  Short Term: Continued assessment and intervention until BP is < 140/954mHG in hypertensive participants. < 130/8061mG in hypertensive participants with diabetes, heart failure or chronic kidney disease.;Long Term: Maintenance of blood pressure at goal levels.    Lipids  Yes    Intervention  Provide education and support for participant on nutrition & aerobic/resistive exercise along with prescribed medications to achieve LDL <73m, HDL >477m    Expected Outcomes  Short Term: Participant states understanding of desired cholesterol values and is compliant with medications prescribed. Participant is following exercise prescription and nutrition guidelines.;Long Term: Cholesterol controlled with medications as prescribed, with individualized exercise RX and with personalized nutrition plan. Value goals: LDL < 7081mHDL > 40 mg.       Education:Diabetes - Individual verbal and written instruction to review signs/symptoms of diabetes, desired ranges of glucose level fasting, after meals and with exercise. Acknowledge that pre and post exercise glucose checks will be done for 3 sessions at entry of program.   [facility]  Date  10/16/19  [facility]  1- Verbalizes Understanding      Education: Know Your Numbers and Risk Factors: -Group verbal and written instruction about important numbers in your health.  Discussion of what are risk factors and how they play a role in the disease process.  Review of Cholesterol, Blood Pressure, Diabetes, and BMI and the role they play in your overall health.   [facility]  Date  05/08/18  [facility]  Code  1- Verbalizes Understanding      Core Components/Risk Factors/Patient Goals Review:    Core Components/Risk Factors/Patient Goals at Discharge (Final Review):    ITP Comments: ITP Comments    Row Name 10/16/19 [id number]/09 1102         ITP Comments  Virtual Visit completed. Patient informed on EP and RD appointment and 6 Minute walk test. Patient also informed of patient health questionnaires on My Chart. Patient Verbalizes understanding. [facility]  Completed 6MWT and gym orientation.  Initial ITP created and sent for review to Dr. MarEmily Filbertedical Director.         Comments: Initial ITP

## 2019-10-31 ENCOUNTER — Ambulatory Visit: Payer: Medicare PPO

## 2019-11-03 ENCOUNTER — Ambulatory Visit: Payer: Medicare PPO

## 2019-11-03 ENCOUNTER — Encounter: Payer: Medicare PPO | Admitting: *Deleted

## 2019-11-03 ENCOUNTER — Other Ambulatory Visit: Payer: Self-pay

## 2019-11-03 DIAGNOSIS — J449 Chronic obstructive pulmonary disease, unspecified: Secondary | ICD-10-CM

## 2019-11-03 DIAGNOSIS — I5030 Unspecified diastolic (congestive) heart failure: Secondary | ICD-10-CM | POA: Diagnosis not present

## 2019-11-03 DIAGNOSIS — I5032 Chronic diastolic (congestive) heart failure: Secondary | ICD-10-CM

## 2019-11-03 LAB — GLUCOSE, CAPILLARY
Glucose-Capillary: 173 mg/dL — ABNORMAL HIGH (ref 70–99)
Glucose-Capillary: 143 mg/dL — ABNORMAL HIGH (ref 70–99)

## 2019-11-03 NOTE — Progress Notes (Signed)
Daily Session Note  Patient Details  Name: Ernest Mclean MRN: [id number] Date of Birth: [birth year] Referring Provider:     Pulmonary Rehab from 10/29/2019 in [facility] Cardiac and Pulmonary Rehab  Referring Provider Lujean Amel MD      Encounter Date: 11/03/2019  Check In:  Session Check In - 11/03/19 1048      Check-In   Supervising physician immediately available to respond to emergencies See telemetry face sheet for immediately available ER MD    Location [facility]-Cardiac & Pulmonary Rehab    Staff Present Renita Papa, RN BSN;Joseph [address] [place], [place], ACSM CEP, Exercise Physiologist    Virtual Visit No    Medication changes reported     No    Fall or balance concerns reported    No    Warm-up and Cool-down Performed on first and last piece of equipment    Resistance Training Performed Yes    VAD Patient? No    PAD/SET Patient? No      Pain Assessment   Currently in Pain? No/denies              Social History   Tobacco Use  Smoking Status Former Smoker  . Packs/day: 1.00  . Years: 30.00  . Pack years: 30.00  . Types: Cigarettes  . Quit date: 12/14/2001  . Years since quitting: 17.8  Smokeless Tobacco Never Used  Tobacco Comment   quit 12/14/2001    Goals Met:  Independence with exercise equipment Exercise tolerated well No report of cardiac concerns or symptoms Strength training completed today  Goals Unmet:  Not Applicable  Comments: First full day of exercise!  Patient was oriented to gym and equipment including functions, settings, policies, and procedures.  Patient's individual exercise prescription and treatment plan were reviewed.  All starting workloads were established based on the results of the 6 minute walk test done at initial orientation visit.  The plan for exercise progression was also introduced and progression will be customized based on patient's performance and goals.     Dr. Emily Filbert is Medical Director for  [place] [facility].

## 2019-11-05 ENCOUNTER — Other Ambulatory Visit: Payer: Self-pay

## 2019-11-05 ENCOUNTER — Encounter: Payer: Medicare PPO | Admitting: *Deleted

## 2019-11-05 ENCOUNTER — Ambulatory Visit: Payer: Medicare PPO

## 2019-11-05 ENCOUNTER — Encounter: Payer: Self-pay | Admitting: *Deleted

## 2019-11-05 DIAGNOSIS — I5030 Unspecified diastolic (congestive) heart failure: Secondary | ICD-10-CM | POA: Diagnosis not present

## 2019-11-05 DIAGNOSIS — I5032 Chronic diastolic (congestive) heart failure: Secondary | ICD-10-CM

## 2019-11-05 LAB — GLUCOSE, CAPILLARY
Glucose-Capillary: 122 mg/dL — ABNORMAL HIGH (ref 70–99)
Glucose-Capillary: 93 mg/dL (ref 70–99)
Glucose-Capillary: 91 mg/dL (ref 70–99)

## 2019-11-05 NOTE — Progress Notes (Signed)
Pulmonary Individual Treatment Plan  Patient Details  Name: Ernest Mclean MRN: [id number] Date of Birth: [birth year] Referring Provider:     Pulmonary Rehab from 10/29/2019 in [facility] Cardiac and Pulmonary Rehab  Referring Provider Lujean Amel MD      Initial Encounter Date:    Pulmonary Rehab from 10/29/2019 in [facility] Cardiac and Pulmonary Rehab  Date 10/29/19      Visit Diagnosis: Heart failure, diastolic, chronic ([place])  Patient's Home Medications on Admission:  Current Outpatient Medications:  .  albuterol (VENTOLIN HFA) 108 (90 Base) MCG/ACT inhaler, Inhale 2 puffs into the lungs every 4 (four) hours as needed for wheezing or shortness of breath., Disp: 18 g, Rfl: 3 .  amLODipine (NORVASC) 5 MG tablet, Take 5 mg by mouth daily. , Disp: , Rfl:  .  apixaban (ELIQUIS) 5 MG TABS tablet, Take 2.5 mg by mouth 2 (two) times daily. , Disp: , Rfl:  .  aspirin EC 81 MG tablet, Take 81 mg by mouth daily., Disp: , Rfl:  .  atorvastatin (LIPITOR) 20 MG tablet, Take by mouth., Disp: , Rfl:  .  BD INSULIN SYRINGE U/F 31G X 5/16" 0.5 ML MISC, , Disp: , Rfl:  .  citalopram (CELEXA) 20 MG tablet, Take 20 mg by mouth at bedtime. , Disp: , Rfl: 5 .  CRANBERRY PO, Take 820 mg by mouth daily., Disp: , Rfl:  .  cyanocobalamin 500 MCG tablet, Take 500 mcg by mouth daily., Disp: , Rfl:  .  diazepam (VALIUM) 5 MG tablet, Take 5 mg by mouth every 12 (twelve) hours as needed for anxiety. , Disp: , Rfl:  .  ergocalciferol (VITAMIN D2) 50000 units capsule, Take 50,000 Units by mouth every 30 (thirty) days., Disp: , Rfl:  .  furosemide (LASIX) 20 MG tablet, Take 20 mg by mouth daily. , Disp: , Rfl:  .  HUMALOG 100 UNIT/ML injection, Inject 10-25 Units into the skin 3 (three) times daily with meals. (sliding scale as needed to a maximum of 150u daily), Disp: , Rfl:  .  insulin glargine (LANTUS) 100 UNIT/ML injection, Inject 60 Units into the skin at bedtime. , Disp: , Rfl:  .  lisinopril (PRINIVIL,ZESTRIL) 5 MG  tablet, Take 5 mg by mouth daily., Disp: , Rfl:  .  metFORMIN (GLUCOPHAGE-XR) 500 MG 24 hr tablet, Take 1,000 mg by mouth daily with supper., Disp: , Rfl:  .  metoprolol tartrate (LOPRESSOR) 100 MG tablet, Take 0.5 tablets (50 mg total) by mouth 2 (two) times daily. This is a decrease from your previous 100 mg twice a day., Disp: , Rfl: 0 .  OMEGA-3 FATTY ACIDS-VITAMIN E PO, Take 1 capsule by mouth daily., Disp: , Rfl:  .  omeprazole (PRILOSEC) 20 MG capsule, Take 20 mg by mouth daily., Disp: , Rfl:  .  pravastatin (PRAVACHOL) 20 MG tablet, Take 20 mg by mouth every evening., Disp: , Rfl:  .  pregabalin (LYRICA) 25 MG capsule, Take 25 mg by mouth 2 (two) times daily., Disp: , Rfl:  .  tiotropium (SPIRIVA) 18 MCG inhalation capsule, Place 18 mcg into inhaler and inhale daily., Disp: , Rfl:  .  vitamin C (ASCORBIC ACID) 500 MG tablet, Take 500 mg by mouth daily., Disp: , Rfl:  .  WIXELA INHUB 500-50 MCG/DOSE AEPB, USE 1 INHALATION BY MOUTH TWICE DAILY (Patient taking differently: Inhale 1 puff into the lungs 2 (two) times daily. ), Disp: 180 each, Rfl: 2  Past Medical History: Past Medical History:  Diagnosis Date  . Atrial fibrillation ([place])   . Basal cell carcinoma 06/2017   Left Ear  . CAD (coronary artery disease)   . CKD (chronic kidney disease)   . Congestive heart failure ([place])   . COPD (chronic obstructive pulmonary disease) ([place])   . Diabetes ([place])   . Hypertension   . OSA on CPAP   . 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 Packs/day: 1.00  . Years: 30.00  . Pack years: 30.00  . Types: Cigarettes  . Quit date: 12/14/2001  . Years since quitting: 17.9  Smokeless Tobacco Never Used  Tobacco Comment   quit 12/14/2001    Labs: Recent Review Flowsheet Data    Labs for ITP Cardiac and Pulmonary Rehab Latest Ref Rng & Units 06/23/2014 05/13/2015 08/01/2016 01/03/2019 09/28/2019   Cholestrol 0 - 200  mg/dL - - - - 101   LDLCALC 0 - 99 mg/dL - - - - 60   HDL >40 mg/dL - - - - 28(L)   Trlycerides <150 mg/dL - - - - 67   Hemoglobin A1c 4.8 - 5.6 % 10.8(H) 7.9(H) 8.4(H) 7.9(H) 8.4(H)   HCO3 20.0 - 28.0 mmol/L - - 32.4(H) 27.2 -   O2SAT % - - - 79.7 -       Pulmonary Assessment Scores:  Pulmonary Assessment Scores    Row Name 10/29/19 1130         ADL UCSD   ADL Phase Entry     SOB Score total 30     Rest 0     Walk 3     Stairs 2     Bath 0     Dress 0     Shop 1       CAT Score   CAT Score 7       mMRC Score   mMRC Score 3            UCSD: Self-administered rating of dyspnea associated with activities of daily living (ADLs) 6-point scale (0 = "not at all" to 5 = "maximal or unable to do because of breathlessness")  Scoring Scores range from 0 to 120.  Minimally important difference is 5 units  CAT: CAT can identify the health impairment of COPD patients and is better correlated with disease progression.  CAT has a scoring range of zero to 40. The CAT score is classified into four groups of low (less than 10), medium (10 - 20), high (21-30) and very high (31-40) based on the impact level of disease on health status. A CAT score over 10 suggests significant symptoms.  A worsening CAT score could be explained by an exacerbation, poor medication adherence, poor inhaler technique, or progression of COPD or comorbid conditions.  CAT MCID is 2 points  mMRC: mMRC (Modified Medical Research Council) Dyspnea Scale is used to assess the degree of baseline functional disability in patients of respiratory disease due to dyspnea. No minimal important difference is established. A decrease in score of 1 point or greater is considered a positive change.   Pulmonary Function Assessment:  Pulmonary Function Assessment - 10/16/19 0938      Breath   Shortness of Breath Yes;Limiting activity           Exercise Target Goals: Exercise Program Goal: Individual exercise  prescription set using results from initial 6 min walk test and THRR while  considering  patient's activity barriers and safety.   Exercise Prescription Goal: Initial exercise prescription builds to 30-45 minutes a day of aerobic activity, 2-3 days per week.  Home exercise guidelines will be given to patient during program as part of exercise prescription that the participant will acknowledge.  Education: Aerobic Exercise & Resistance Training: - Gives group verbal and written instruction on the various components of exercise. Focuses on aerobic and resistive training programs and the benefits of this training and how to safely progress through these programs..   Pulmonary Rehab from 07/10/2018 in [facility] Cardiac and Pulmonary Rehab  Date 06/14/18  Educator [facility]  Instruction Review Code 1- Verbalizes Understanding      Education: Exercise & Equipment Safety: - Individual verbal instruction and demonstration of equipment use and safety with use of the equipment.   Pulmonary Rehab from 10/29/2019 in [facility] Cardiac and Pulmonary Rehab  Date 10/16/19  Educator [facility]  Instruction Review Code 1- Verbalizes Understanding      Education: Exercise Physiology & General Exercise Guidelines: - Group verbal and written instruction with models to review the exercise physiology of the cardiovascular system and associated critical values. Provides general exercise guidelines with specific guidelines to those with heart or lung disease.    Pulmonary Rehab from 07/10/2018 in [facility] Cardiac and Pulmonary Rehab  Date 06/12/18  Educator [facility]  Instruction Review Code 1- Verbalizes Understanding      Education: Flexibility, Balance, Mind/Body Relaxation: Provides group verbal/written instruction on the benefits of flexibility and balance training, including mind/body exercise modes such as yoga, pilates and tai chi.  Demonstration and skill practice provided.   Pulmonary Rehab from 07/10/2018 in [facility] Cardiac and Pulmonary  Rehab  Date 06/19/18  Educator [facility]  Instruction Review Code 1- Verbalizes Understanding      Activity Barriers & Risk Stratification:  Activity Barriers & Cardiac Risk Stratification - 10/29/19 1121      Activity Barriers & Cardiac Risk Stratification   Activity Barriers Arthritis;Shortness of Breath;Balance Concerns;Deconditioning;Muscular Weakness           6 Minute Walk:  6 Minute Walk    Row Name 10/29/19 1103         6 Minute Walk   Phase Initial     Distance 815 feet     Walk Time 5.88 minutes     # of Rest Breaks 1  7 sec     MPH 1.57     METS 1.5     RPE 15     Perceived Dyspnea  3     VO2 Peak 5.23     Symptoms Yes (comment)     Comments calf pain 8/10     Resting HR 56 bpm     Resting BP 108/64     Resting Oxygen Saturation  96 %     Exercise Oxygen Saturation  during 6 min walk 88 %     Max Ex. HR 88 bpm     Max Ex. BP 134/56     2 Minute Post BP 128/70       Interval HR   1 Minute HR 64     2 Minute HR 66     3 Minute HR 72     4 Minute HR 81     5 Minute HR 72     6 Minute HR 88     2 Minute Post HR 67     Interval Heart Rate? Yes  Interval Oxygen   Interval Oxygen? Yes     Baseline Oxygen Saturation % 96 %     1 Minute Oxygen Saturation % 89 %     1 Minute Liters of Oxygen 2 L  continuous     2 Minute Oxygen Saturation % 88 %     2 Minute Liters of Oxygen 2 L     3 Minute Oxygen Saturation % 88 %     3 Minute Liters of Oxygen 2 L     4 Minute Oxygen Saturation % 89 %     4 Minute Liters of Oxygen 2 L     5 Minute Oxygen Saturation % 90 %     5 Minute Liters of Oxygen 0 L     6 Minute Oxygen Saturation % 89 %     6 Minute Liters of Oxygen 0 L     2 Minute Post Oxygen Saturation % 96 %     2 Minute Post Liters of Oxygen 2 L           Oxygen Initial Assessment:  Oxygen Initial Assessment - 10/16/19 0937      Home Oxygen   Home Oxygen Device Home Concentrator;Portable Concentrator;E-Tanks    Sleep Oxygen Prescription  Continuous;CPAP    Liters per minute 2    Home Exercise Oxygen Prescription Continuous    Liters per minute 2    Home at Rest Exercise Oxygen Prescription None    Compliance with Home Oxygen Use Yes      Initial 6 min Walk   Oxygen Used Continuous    Liters per minute 2      Program Oxygen Prescription   Program Oxygen Prescription Continuous    Liters per minute 2      Intervention   Short Term Goals To learn and exhibit compliance with exercise, home and travel O2 prescription;To learn and understand importance of monitoring SPO2 with pulse oximeter and demonstrate accurate use of the pulse oximeter.;To learn and understand importance of maintaining oxygen saturations>88%;To learn and demonstrate proper pursed lip breathing techniques or other breathing techniques.;To learn and demonstrate proper use of respiratory medications    Long  Term Goals Exhibits compliance with exercise, home and travel O2 prescription;Verbalizes importance of monitoring SPO2 with pulse oximeter and return demonstration;Maintenance of O2 saturations>88%;Exhibits proper breathing techniques, such as pursed lip breathing or other method taught during program session;Compliance with respiratory medication;Demonstrates proper use of MDI's           Oxygen Re-Evaluation:  Oxygen Re-Evaluation    Row Name 11/03/19 1102             Program Oxygen Prescription   Program Oxygen Prescription Continuous       Liters per minute 2         Home Oxygen   Home Oxygen Device Home Concentrator;Portable Concentrator;E-Tanks       Sleep Oxygen Prescription Continuous;CPAP       Liters per minute 2       Home Exercise Oxygen Prescription Continuous       Liters per minute 2       Home at Rest Exercise Oxygen Prescription None       Compliance with Home Oxygen Use Yes         Goals/Expected Outcomes   Short Term Goals To learn and exhibit compliance with exercise, home and travel O2 prescription;To learn and  understand importance of monitoring SPO2 with pulse oximeter and demonstrate accurate use  of the pulse oximeter.;To learn and understand importance of maintaining oxygen saturations>88%;To learn and demonstrate proper pursed lip breathing techniques or other breathing techniques.;To learn and demonstrate proper use of respiratory medications       Long  Term Goals Exhibits compliance with exercise, home and travel O2 prescription;Verbalizes importance of monitoring SPO2 with pulse oximeter and return demonstration;Maintenance of O2 saturations>88%;Exhibits proper breathing techniques, such as pursed lip breathing or other method taught during program session;Compliance with respiratory medication;Demonstrates proper use of MDI's       Comments Reviewed PLB technique with pt.  Talked about how it works and it's importance in maintaining their exercise saturations.       Goals/Expected Outcomes Short: Become more profiecient at using PLB.   Long: Become independent at using PLB.              Oxygen Discharge (Final Oxygen Re-Evaluation):  Oxygen Re-Evaluation - 11/03/19 1102      Program Oxygen Prescription   Program Oxygen Prescription Continuous    Liters per minute 2      Home Oxygen   Home Oxygen Device Home Concentrator;Portable Concentrator;E-Tanks    Sleep Oxygen Prescription Continuous;CPAP    Liters per minute 2    Home Exercise Oxygen Prescription Continuous    Liters per minute 2    Home at Rest Exercise Oxygen Prescription None    Compliance with Home Oxygen Use Yes      Goals/Expected Outcomes   Short Term Goals To learn and exhibit compliance with exercise, home and travel O2 prescription;To learn and understand importance of monitoring SPO2 with pulse oximeter and demonstrate accurate use of the pulse oximeter.;To learn and understand importance of maintaining oxygen saturations>88%;To learn and demonstrate proper pursed lip breathing techniques or other breathing  techniques.;To learn and demonstrate proper use of respiratory medications    Long  Term Goals Exhibits compliance with exercise, home and travel O2 prescription;Verbalizes importance of monitoring SPO2 with pulse oximeter and return demonstration;Maintenance of O2 saturations>88%;Exhibits proper breathing techniques, such as pursed lip breathing or other method taught during program session;Compliance with respiratory medication;Demonstrates proper use of MDI's    Comments Reviewed PLB technique with pt.  Talked about how it works and it's importance in maintaining their exercise saturations.    Goals/Expected Outcomes Short: Become more profiecient at using PLB.   Long: Become independent at using PLB.           Initial Exercise Prescription:  Initial Exercise Prescription - 10/29/19 1100      Date of Initial Exercise RX and Referring Provider   Date 10/29/19    Referring Provider Lujean Amel MD      Oxygen   Oxygen Continuous    Liters 2      Recumbant Bike   Level 1    RPM 50    Watts 16    Minutes 15    METs 1.5      NuStep   Level 1    SPM 80    Minutes 15    METs 1.5      Arm Ergometer   Level 1    Watts 23    RPM 25    Minutes 15    METs 1.5      Biostep-RELP   Level 1    SPM 50    Minutes 15    METs 1      Prescription Details   Frequency (times per week) 3    Duration Progress to  30 minutes of continuous aerobic without signs/symptoms of physical distress      Intensity   THRR 40-80% of Max Heartrate 91-126    Ratings of Perceived Exertion 11-13    Perceived Dyspnea 0-4      Progression   Progression Continue to progress workloads to maintain intensity without signs/symptoms of physical distress.      Resistance Training   Training Prescription Yes    Weight 3 lb    Reps 10-15           Perform Capillary Blood Glucose checks as needed.  Exercise Prescription Changes:  Exercise Prescription Changes    Row Name 10/29/19 1100               Response to Exercise   Blood Pressure (Admit) 108/64       Blood Pressure (Exercise) 134/56       Blood Pressure (Exit) 124/66       Heart Rate (Admit) 56 bpm       Heart Rate (Exercise) 81 bpm       Heart Rate (Exit) 61 bpm       Oxygen Saturation (Admit) 96 %       Oxygen Saturation (Exercise) 88 %       Oxygen Saturation (Exit) 97 %       Rating of Perceived Exertion (Exercise) 15       Perceived Dyspnea (Exercise) 3       Symptoms calf pain 8/10       Comments walk test results              Exercise Comments:   Exercise Goals and Review:  Exercise Goals    Row Name 10/29/19 1125             Exercise Goals   Increase Physical Activity Yes       Intervention Provide advice, education, support and counseling about physical activity/exercise needs.;Develop an individualized exercise prescription for aerobic and resistive training based on initial evaluation findings, risk stratification, comorbidities and participant's personal goals.       Expected Outcomes Short Term: Attend rehab on a regular basis to increase amount of physical activity.;Long Term: Add in home exercise to make exercise part of routine and to increase amount of physical activity.;Long Term: Exercising regularly at least 3-5 days a week.       Increase Strength and Stamina Yes       Intervention Provide advice, education, support and counseling about physical activity/exercise needs.;Develop an individualized exercise prescription for aerobic and resistive training based on initial evaluation findings, risk stratification, comorbidities and participant's personal goals.       Expected Outcomes Short Term: Increase workloads from initial exercise prescription for resistance, speed, and METs.;Short Term: Perform resistance training exercises routinely during rehab and add in resistance training at home;Long Term: Improve cardiorespiratory fitness, muscular endurance and strength as measured by  increased METs and functional capacity (6MWT)       Able to understand and use rate of perceived exertion (RPE) scale Yes       Intervention Provide education and explanation on how to use RPE scale       Expected Outcomes Long Term:  Able to use RPE to guide intensity level when exercising independently;Short Term: Able to use RPE daily in rehab to express subjective intensity level       Able to understand and use Dyspnea scale Yes       Intervention Provide education and  explanation on how to use Dyspnea scale       Expected Outcomes Short Term: Able to use Dyspnea scale daily in rehab to express subjective sense of shortness of breath during exertion;Long Term: Able to use Dyspnea scale to guide intensity level when exercising independently       Knowledge and understanding of Target Heart Rate Range (THRR) Yes       Intervention Provide education and explanation of THRR including how the numbers were predicted and where they are located for reference       Expected Outcomes Short Term: Able to state/look up THRR;Short Term: Able to use daily as guideline for intensity in rehab;Long Term: Able to use THRR to govern intensity when exercising independently       Able to check pulse independently Yes       Intervention Provide education and demonstration on how to check pulse in carotid and radial arteries.;Review the importance of being able to check your own pulse for safety during independent exercise       Expected Outcomes Short Term: Able to explain why pulse checking is important during independent exercise;Long Term: Able to check pulse independently and accurately       Understanding of Exercise Prescription Yes       Intervention Provide education, explanation, and written materials on patient's individual exercise prescription       Expected Outcomes Short Term: Able to explain program exercise prescription;Long Term: Able to explain home exercise prescription to exercise independently        Improve claudication pain toleration; Improve walking ability Yes       Intervention Attend education sessions to aid in risk factor modification and understanding of disease process       Expected Outcomes Short Term: Improve walking distance/time to onset of claudication pain;Long Term: Improve walking ability and toleration to claudication              Exercise Goals Re-Evaluation :  Exercise Goals Re-Evaluation    Row Name 11/03/19 1101             Exercise Goal Re-Evaluation   Exercise Goals Review Increase Physical Activity;Increase Strength and Stamina;Able to understand and use Dyspnea scale;Able to check pulse independently;Understanding of Exercise Prescription;Knowledge and understanding of Target Heart Rate Range (THRR);Able to understand and use rate of perceived exertion (RPE) scale       Comments Reviewed RPE and dyspnea scales, THR and program prescription with pt today.  Pt voiced understanding and was given a copy of goals to take home.       Expected Outcomes Short: Use RPE daily to regulate intensity. Long: Follow program prescription in THR.              Discharge Exercise Prescription (Final Exercise Prescription Changes):  Exercise Prescription Changes - 10/29/19 1100      Response to Exercise   Blood Pressure (Admit) 108/64    Blood Pressure (Exercise) 134/56    Blood Pressure (Exit) 124/66    Heart Rate (Admit) 56 bpm    Heart Rate (Exercise) 81 bpm    Heart Rate (Exit) 61 bpm    Oxygen Saturation (Admit) 96 %    Oxygen Saturation (Exercise) 88 %    Oxygen Saturation (Exit) 97 %    Rating of Perceived Exertion (Exercise) 15    Perceived Dyspnea (Exercise) 3    Symptoms calf pain 8/10    Comments walk test results  Nutrition:  Target Goals: Understanding of nutrition guidelines, daily intake of sodium <1549m, cholesterol <202m calories 30% from fat and 7% or less from saturated fats, daily to have 5 or more servings of fruits and  vegetables.  Education: Controlling Sodium/Reading Food Labels -Group verbal and written material supporting the discussion of sodium use in heart healthy nutrition. Review and explanation with models, verbal and written materials for utilization of the food label.   [facility]  Date 07/10/18  [facility] Code 1- Verbalizes Understanding      Education: General Nutrition Guidelines/Fats and Fiber: -Group instruction provided by verbal, written material, models and posters to present the general guidelines for heart healthy nutrition. Gives an explanation and review of dietary fats and fiber.   [facility]  Date 07/03/18  [facility] Code 1- Verbalizes Understanding      Biometrics:  Pre Biometrics - 10/29/19 1126      Pre Biometrics   Height 6' 0.5" (1.842 m)    Weight 223 lb 8 oz (101.4 kg)    BMI (Calculated) 29.88    Single Leg Stand 0 seconds            Nutrition Therapy Plan and Nutrition Goals:   Nutrition Assessments:  Nutrition Assessments - 10/29/19 1127      MEDFICTS Scores   Pre Score 42           MEDIFICTS Score Key:          ?70 Need to make dietary changes          40-70 Heart Healthy Diet         ? 40 Therapeutic Level Cholesterol Diet  Nutrition Goals Re-Evaluation:   Nutrition Goals Discharge (Final Nutrition Goals Re-Evaluation):   Psychosocial: Target Goals: Acknowledge presence or absence of significant depression and/or stress, maximize coping skills, provide positive support system. Participant is able to verbalize types and ability to use techniques and skills needed for reducing stress and depression.   Education: Depression - Provides group verbal and written instruction on the correlation between heart/lung disease and depressed mood, treatment options, and the stigmas associated  with seeking treatment.   Education: Sleep Hygiene -Provides group verbal and written instruction about how sleep can affect your health.  Define sleep hygiene, discuss sleep cycles and impact of sleep habits. Review good sleep hygiene tips.    [facility]  Date 05/29/18  [facility] Code 1- Verbalizes Understanding      Education: Stress and Anxiety: - Provides group verbal and written instruction about the health risks of elevated stress and causes of high stress.  Discuss the correlation between heart/lung disease and anxiety and treatment options. Review healthy ways to manage with stress and anxiety.   [facility]  Date 06/26/18  [facility] Review Code 1- Verbalizes Understanding      Initial Review & Psychosocial Screening:  Initial Psych Review & Screening - 10/16/19 0938      Initial Review   Current issues with Current Anxiety/Panic;History of Depression      Family Dynamics   Good Support System? Yes    Comments LaDontrelleas some anxiety and everything that he has been through with his healthcan make him feel that way. He has got  over his fear of dying and is not depressed like he used to me.      Barriers   Psychosocial barriers to participate in program The patient should benefit from training in stress management and relaxation.      Screening Interventions   Interventions Encouraged to exercise;Provide feedback about the scores to participant;To provide support and resources with identified psychosocial needs    Expected Outcomes Short Term goal: Utilizing psychosocial counselor, staff and physician to assist with identification of specific Stressors or current issues interfering with healing process. Setting desired goal for each stressor or current issue identified.;Long Term Goal: Stressors or current issues are controlled or  eliminated.;Short Term goal: Identification and review with participant of any Quality of Life or Depression concerns found by scoring the questionnaire.;Long Term goal: The participant improves quality of Life and PHQ9 Scores as seen by post scores and/or verbalization of changes           Quality of Life Scores:  Scores of 19 and below usually indicate a poorer quality of life in these areas.  A difference of  2-3 points is a clinically meaningful difference.  A difference of 2-3 points in the total score of the Quality of Life Index has been associated with significant improvement in overall quality of life, self-image, physical symptoms, and general health in studies assessing change in quality of life.  PHQ-9: Recent Review Flowsheet Data    Depression screen [facility] 2/9 10/29/2019 04/22/2018 04/01/2018 07/16/2017 03/19/2017   Decreased Interest 1 0 0 0 0   Down, Depressed, Hopeless 1 0 0 0 0   PHQ - 2 Score 2 0 0 0 0   Altered sleeping 0 0 0 - -   Tired, decreased energy 1 0 1 - -   Change in appetite 0 0 0 - -   Feeling bad or failure about yourself  1 0 0 - -   Trouble concentrating 0 0 0 - -   Moving slowly or fidgety/restless 0 0 0 - -   Suicidal thoughts 0 0 0 - -   PHQ-9 Score 4 0 1 - -   Difficult doing work/chores Not difficult at all Not difficult at all Not difficult at all - -     Interpretation of Total Score  Total Score Depression Severity:  1-4 = Minimal depression, 5-9 = Mild depression, 10-14 = Moderate depression, 15-19 = Moderately severe depression, 20-27 = Severe depression   Psychosocial Evaluation and Intervention:  Psychosocial Evaluation - 10/16/19 0940      Psychosocial Evaluation & Interventions   Interventions Encouraged to exercise with the program and follow exercise prescription    Comments Suhail has some anxiety and everything that he has been through with his healthcan make him feel that way. He has got over his fear of dying and is not depressed  like he used to me.    Expected Outcomes Short: Start [facility] to help with mood. Long: Maintain a healthy mental state.    Continue Psychosocial Services  Follow up required by staff           Psychosocial Re-Evaluation:   Psychosocial Discharge (Final Psychosocial Re-Evaluation):   Education: Education Goals: Education classes will be provided on a weekly basis, covering required topics. Participant will state understanding/return demonstration of topics presented.  Learning Barriers/Preferences:  Learning Barriers/Preferences - 10/16/19 0940      Learning Barriers/Preferences   Learning Barriers Sight    Learning Preferences None  General Pulmonary Education Topics:  Infection Prevention: - Provides verbal and written material to individual with discussion of infection control including proper hand washing and proper equipment cleaning during exercise session.   Pulmonary Rehab from 10/29/2019 in [facility] Cardiac and Pulmonary Rehab  Date 10/16/19  Educator [facility]  Instruction Review Code 1- Verbalizes Understanding      Falls Prevention: - Provides verbal and written material to individual with discussion of falls prevention and safety.   Pulmonary Rehab from 10/29/2019 in [facility] Cardiac and Pulmonary Rehab  Date 10/16/19  Educator [facility]  Instruction Review Code 1- Verbalizes Understanding      Chronic Lung Diseases: - Group verbal and written instruction to review updates, respiratory medications, advancements in procedures and treatments. Discuss use of supplemental oxygen including available portable oxygen systems, continuous and intermittent flow rates, concentrators, personal use and safety guidelines. Review proper use of inhaler and spacers. Provide informative websites for self-education.    Pulmonary Rehab from 07/10/2018 in [facility] Cardiac and Pulmonary Rehab  Date 06/28/18  Educator [facility]  Instruction Review Code 1- Verbalizes Understanding      Energy  Conservation: - Provide group verbal and written instruction for methods to conserve energy, plan and organize activities. Instruct on pacing techniques, use of adaptive equipment and posture/positioning to relieve shortness of breath.   Triggers and Exacerbations: - Group verbal and written instruction to review types of environmental triggers and ways to prevent exacerbations. Discuss weather changes, air quality and the benefits of nasal washing. Review warning signs and symptoms to help prevent infections. Discuss techniques for effective airway clearance, coughing, and vibrations.   [facility]ulmonary [facility]  Date 05/10/18  Educator [facility]  Instruction Review Code 1- Verbalizes Understanding      AED/CPR: - Group verbal and written instruction with the use of models to demonstrate the basic use of the AED with the basic ABC's of resuscitation.   Anatomy and Physiology of the Lungs: - Group verbal and written instruction with the use of models to provide basic lung anatomy and physiology related to function, structure and complications of lung disease.   Pulmonary Rehab from 07/10/2018 in [facility] Cardiac and Pulmonary Rehab  Date 04/05/18  Educator [facility]  Instruction Review Code 1- Verbalizes Understanding      Anatomy & Physiology of the Heart: - Group verbal and written instruction and models provide basic cardiac anatomy and physiology, with the coronary electrical and arterial systems. Review of Valvular disease and Heart Failure   Pulmonary Rehab from 07/10/2018 in [facility] Cardiac and Pulmonary Rehab  Date 05/24/18  Educator [facility]  Instruction Review Code 1- Verbalizes Understanding      Cardiac Medications: - Group verbal and written instruction to review commonly prescribed medications for heart disease. Reviews the medication, class of the drug, and side effects.   Pulmonary Rehab from 07/10/2018 in [facility] Cardiac and Pulmonary Rehab  Date  05/31/18  Educator [facility]  Instruction Review Code 1- Verbalizes Understanding      Other: -Provides group and verbal instruction on various topics (see comments)   Knowledge Questionnaire Score:  Knowledge Questionnaire Score - 10/29/19 1128      Knowledge Questionnaire Score   Pre Score 17/18 Education Focus: O2 safety            Core Components/Risk Factors/Patient Goals at Admission:  Personal Goals and Risk Factors at Admission - 10/29/19 1128      Core Components/Risk Factors/Patient Goals on Admission  Weight Management Yes;Weight Loss    Intervention Weight Management: Develop a combined nutrition and exercise program designed to reach desired caloric intake, while maintaining appropriate intake of nutrient and fiber, sodium and fats, and appropriate energy expenditure required for the weight goal.;Weight Management: Provide education and appropriate resources to help participant work on and attain dietary goals.    Admit Weight 223 lb 8 oz (101.4 kg)    Goal Weight: Short Term 218 lb (98.9 kg)    Goal Weight: Long Term 213 lb (96.6 kg)    Expected Outcomes Long Term: Adherence to nutrition and physical activity/exercise program aimed toward attainment of established weight goal;Short Term: Continue to assess and modify interventions until short term weight is achieved;Weight Loss: Understanding of general recommendations for a balanced deficit meal plan, which promotes 1-2 lb weight loss per week and includes a negative energy balance of [phone] kcal/d;Understanding recommendations for meals to include 15-35% energy as protein, 25-35% energy from fat, 35-60% energy from carbohydrates, less than 285m of dietary cholesterol, 20-35 gm of total fiber daily;Understanding of distribution of calorie intake throughout the day with the consumption of 4-5 meals/snacks    Improve shortness of breath with ADL's Yes    Intervention Provide education, individualized exercise plan and daily  activity instruction to help decrease symptoms of SOB with activities of daily living.    Expected Outcomes Short Term: Improve cardiorespiratory fitness to achieve a reduction of symptoms when performing ADLs;Long Term: Be able to perform more ADLs without symptoms or delay the onset of symptoms    Diabetes Yes    Intervention Provide education about signs/symptoms and action to take for hypo/hyperglycemia.;Provide education about proper nutrition, including hydration, and aerobic/resistive exercise prescription along with prescribed medications to achieve blood glucose in normal ranges: Fasting glucose 65-99 mg/dL    Expected Outcomes Short Term: Participant verbalizes understanding of the signs/symptoms and immediate care of hyper/hypoglycemia, proper foot care and importance of medication, aerobic/resistive exercise and nutrition plan for blood glucose control.;Long Term: Attainment of HbA1C < 7%.    Heart Failure Yes    Intervention Provide a combined exercise and nutrition program that is supplemented with education, support and counseling about heart failure. Directed toward relieving symptoms such as shortness of breath, decreased exercise tolerance, and extremity edema.    Expected Outcomes Improve functional capacity of life;Short term: Attendance in program 2-3 days a week with increased exercise capacity. Reported lower sodium intake. Reported increased fruit and vegetable intake. Reports medication compliance.;Short term: Daily weights obtained and reported for increase. Utilizing diuretic protocols set by physician.;Long term: Adoption of self-care skills and reduction of barriers for early signs and symptoms recognition and intervention leading to self-care maintenance.    Hypertension Yes    Intervention Provide education on lifestyle modifcations including regular physical activity/exercise, weight management, moderate sodium restriction and increased consumption of fresh fruit, vegetables,  and low fat dairy, alcohol moderation, and smoking cessation.;Monitor prescription use compliance.    Expected Outcomes Short Term: Continued assessment and intervention until BP is < 140/961mHG in hypertensive participants. < 130/8066mG in hypertensive participants with diabetes, heart failure or chronic kidney disease.;Long Term: Maintenance of blood pressure at goal levels.    Lipids Yes    Intervention Provide education and support for participant on nutrition & aerobic/resistive exercise along with prescribed medications to achieve LDL <59m20mDL >40mg55m Expected Outcomes Short Term: Participant states understanding of desired cholesterol values and is compliant with medications prescribed. Participant is following  exercise prescription and nutrition guidelines.;Long Term: Cholesterol controlled with medications as prescribed, with individualized exercise RX and with personalized nutrition plan. Value goals: LDL < 21m, HDL > 40 mg.           Education:Diabetes - Individual verbal and written instruction to review signs/symptoms of diabetes, desired ranges of glucose level fasting, after meals and with exercise. Acknowledge that pre and post exercise glucose checks will be done for 3 sessions at entry of program.   [facility]  Date 10/16/19  [facility] Instruction Review Code 1- Verbalizes Understanding      Education: Know Your Numbers and Risk Factors: -Group verbal and written instruction about important numbers in your health.  Discussion of what are risk factors and how they play a role in the disease process.  Review of Cholesterol, Blood Pressure, Diabetes, and BMI and the role they play in your overall health.   [facility]  Date 05/08/18  [facility] Instruction Review Code 1- Verbalizes Understanding      Core Components/Risk Factors/Patient Goals Review:     Core Components/Risk Factors/Patient Goals at Discharge (Final Review):    ITP Comments:  ITP Comments    Row Name 10/16/19 0999606/09/21 1102 11/03/19 1100 11/05/19 0636     ITP Comments Virtual Visit completed. Patient informed on EP and RD appointment and 6 Minute walk test. Patient also informed of patient health questionnaires on My Chart. Patient Verbalizes understanding. [facility]. Completed 6MWT and gym orientation.  Initial ITP created and sent for review to Dr. MEmily Filbert Medical Director. First full day of exercise!  Patient was oriented to gym and equipment including functions, settings, policies, and procedures.  Patient's individual exercise prescription and treatment plan were reviewed.  All starting workloads were established based on the results of the 6 minute walk test done at initial orientation visit.  The plan for exercise progression was also introduced and progression will be customized based on patient's performance and goals. 30 Day review completed. Medical Director ITP review done, changes made as directed, and signed approval by Medical Director.           Comments: 30 Day review completed. Medical Director ITP review done, changes made as directed, and signed approval by Medical Director.

## 2019-11-05 NOTE — Progress Notes (Signed)
Daily Session Note  Patient Details  Name: MOUSTAPHA TOOKER MRN: [id number] Date of Birth: November 07, 1940 Referring Provider:     Pulmonary Rehab from 10/29/2019 in [facility] Cardiac and Pulmonary Rehab  Referring Provider Lujean Amel MD      Encounter Date: 11/05/2019  Check In:  Session Check In - 11/05/19 1023      Check-In   Supervising physician immediately available to respond to emergencies See telemetry face sheet for immediately available ER MD    Location [facility]-Cardiac & Pulmonary Rehab    Staff Present Renita Papa, RN BSN;Joseph Hood RCP,RRT,BSRT;Amanda Oletta Darter, [place], ACSM CEP, Exercise Physiologist    Virtual Visit No    Medication changes reported     No    Fall or balance concerns reported    No    Warm-up and Cool-down Performed on first and last piece of equipment    Resistance Training Performed Yes    VAD Patient? No    PAD/SET Patient? No      Pain Assessment   Currently in Pain? No/denies              Social History   Tobacco Use  Smoking Status Former Smoker  . Packs/day: 1.00  . Years: 30.00  . Pack years: 30.00  . Types: Cigarettes  . Quit date: 12/14/2001  . Years since quitting: 17.9  Smokeless Tobacco Never Used  Tobacco Comment   quit 12/14/2001    Goals Met:  Independence with exercise equipment Exercise tolerated well No report of cardiac concerns or symptoms Strength training completed today  Goals Unmet:  Not Applicable  Comments: Pt able to follow exercise prescription today without complaint.  Will continue to monitor for progression.    Dr. Emily Filbert is Medical Director for [place] and [facility].

## 2019-11-07 ENCOUNTER — Ambulatory Visit: Payer: Medicare PPO

## 2019-11-07 ENCOUNTER — Encounter: Payer: Medicare PPO | Admitting: *Deleted

## 2019-11-07 ENCOUNTER — Other Ambulatory Visit: Payer: Self-pay

## 2019-11-07 DIAGNOSIS — I5030 Unspecified diastolic (congestive) heart failure: Secondary | ICD-10-CM | POA: Diagnosis not present

## 2019-11-07 DIAGNOSIS — I5032 Chronic diastolic (congestive) heart failure: Secondary | ICD-10-CM

## 2019-11-07 NOTE — Progress Notes (Signed)
Daily Session Note  Patient Details  Name: Ernest Mclean MRN: [id number] Date of Birth: May 30, 1940 Referring Provider:     Pulmonary Rehab from 10/29/2019 in [facility] Cardiac and Pulmonary Rehab  Referring Provider Lujean Amel MD      Encounter Date: 11/07/2019  Check In:  Session Check In - 11/07/19 1002      Check-In   Supervising physician immediately available to respond to emergencies See telemetry face sheet for immediately available ER MD    Location [facility]-Cardiac & Pulmonary Rehab    Staff Present Renita Papa, RN BSN;Joseph [address] [place], [place], [place], CCRP, CCET    Virtual Visit No    Medication changes reported     No    Fall or balance concerns reported    No    Warm-up and Cool-down Performed on first and last piece of equipment    Resistance Training Performed Yes    VAD Patient? No    PAD/SET Patient? No      Pain Assessment   Currently in Pain? No/denies              Social History   Tobacco Use  Smoking Status Former Smoker  . Packs/day: 1.00  . Years: 30.00  . Pack years: 30.00  . Types: Cigarettes  . Quit date: 12/14/2001  . Years since quitting: 17.9  Smokeless Tobacco Never Used  Tobacco Comment   quit 12/14/2001    Goals Met:  Independence with exercise equipment Exercise tolerated well No report of cardiac concerns or symptoms Strength training completed today  Goals Unmet:  Not Applicable  Comments: Pt able to follow exercise prescription today without complaint.  Will continue to monitor for progression.    Dr. Emily Filbert is Medical Director for [place] and [facility].

## 2019-11-10 ENCOUNTER — Encounter: Payer: Medicare PPO | Admitting: *Deleted

## 2019-11-10 ENCOUNTER — Ambulatory Visit: Payer: Medicare PPO

## 2019-11-10 ENCOUNTER — Other Ambulatory Visit: Payer: Self-pay

## 2019-11-10 ENCOUNTER — Telehealth: Payer: Self-pay | Admitting: Internal Medicine

## 2019-11-10 DIAGNOSIS — I5032 Chronic diastolic (congestive) heart failure: Secondary | ICD-10-CM

## 2019-11-10 DIAGNOSIS — I5030 Unspecified diastolic (congestive) heart failure: Secondary | ICD-10-CM | POA: Diagnosis not present

## 2019-11-10 DIAGNOSIS — J449 Chronic obstructive pulmonary disease, unspecified: Secondary | ICD-10-CM

## 2019-11-10 NOTE — Telephone Encounter (Signed)
LMOVM for Skeet Latch with [employer] to return my call about patient's 02 liter flow. Rhonda J Cobb

## 2019-11-10 NOTE — Telephone Encounter (Signed)
Per our records, pt is to be on 2L.  Will route to [facility] for f/u.

## 2019-11-10 NOTE — Progress Notes (Signed)
Daily Session Note  Patient Details  Name: GEORGIO HATTABAUGH MRN: [id number] Date of Birth: 1940/11/16 Referring Provider:     Pulmonary Rehab from 10/29/2019 in [facility] Cardiac and Pulmonary Rehab  Referring Provider Lujean Amel MD      Encounter Date: 11/10/2019  Check In:  Session Check In - 11/10/19 1019      Check-In   Supervising physician immediately available to respond to emergencies See telemetry face sheet for immediately available ER MD    Staff Present Renita Papa, RN BSN;Joseph [address] [place], [place], [place], CCRP, [place], [place], ACSM CEP, Exercise Physiologist    Virtual Visit No    Medication changes reported     No    Fall or balance concerns reported    No    Warm-up and Cool-down Performed on first and last piece of equipment    Resistance Training Performed Yes    VAD Patient? No    PAD/SET Patient? No      Pain Assessment   Currently in Pain? No/denies              Social History   Tobacco Use  Smoking Status Former Smoker  . Packs/day: 1.00  . Years: 30.00  . Pack years: 30.00  . Types: Cigarettes  . Quit date: 12/14/2001  . Years since quitting: 17.9  Smokeless Tobacco Never Used  Tobacco Comment   quit 12/14/2001    Goals Met:  Independence with exercise equipment Exercise tolerated well No report of cardiac concerns or symptoms Strength training completed today  Goals Unmet:  Not Applicable  Comments: Pt able to follow exercise prescription today without complaint.  Will continue to monitor for progression.    Dr. Emily Filbert is Medical Director for [place] and [facility].

## 2019-11-11 NOTE — Telephone Encounter (Signed)
Our order states 02 at 2 lpm. Called and spoke with patient's wife who stated that the 02 was increased by Dr. Enzo Bi to 3 lpm while in the hospital.  Pt saw Dr. [name] on 10/22/2019 and in his OV note states that upon discharge patient was on 3 L and is currently on 2 L.Ernest Mclean

## 2019-11-11 NOTE — Telephone Encounter (Signed)
Dr. [name] please advise if patient needs to be on 2 lpm or 3 lpm.  Per pt's last OV with you on 10/22/2019 pt was on 2 lpm.  Ernest Mclean

## 2019-11-12 ENCOUNTER — Encounter: Payer: Medicare PPO | Admitting: *Deleted

## 2019-11-12 ENCOUNTER — Ambulatory Visit: Payer: Medicare PPO

## 2019-11-12 ENCOUNTER — Other Ambulatory Visit: Payer: Self-pay

## 2019-11-12 DIAGNOSIS — I5030 Unspecified diastolic (congestive) heart failure: Secondary | ICD-10-CM | POA: Diagnosis not present

## 2019-11-12 DIAGNOSIS — I5032 Chronic diastolic (congestive) heart failure: Secondary | ICD-10-CM

## 2019-11-12 DIAGNOSIS — J449 Chronic obstructive pulmonary disease, unspecified: Secondary | ICD-10-CM

## 2019-11-12 NOTE — Telephone Encounter (Signed)
Pt will need to come in for walk test to determine liter flow. Unable to determine this without a test.  Spoke to pt and scheduled qualifying walk for 11/18/2019 at 9:30.  Will route to Dr. [name] to make aware.  Nothing further is needed.

## 2019-11-12 NOTE — Telephone Encounter (Signed)
We need to keep oxygen sats 88-92%, whatever patient needs to achieve this goal

## 2019-11-12 NOTE — Progress Notes (Signed)
Daily Session Note  Patient Details  Name: Ernest Mclean MRN: [id number] Date of Birth: 18-Sep-1940 Referring Provider:     Pulmonary Rehab from 10/29/2019 in [facility] Cardiac and Pulmonary Rehab  Referring Provider Lujean Amel MD      Encounter Date: 11/12/2019  Check In:  Session Check In - 11/12/19 1025      Check-In   Supervising physician immediately available to respond to emergencies See telemetry face sheet for immediately available ER MD    Location [facility]-Cardiac & Pulmonary Rehab    Staff Present Renita Papa, RN Vickki Hearing, BA, ACSM CEP, Exercise Physiologist;Melissa Caiola RDN, LDN    Virtual Visit No    Medication changes reported     No    Fall or balance concerns reported    No    Warm-up and Cool-down Performed on first and last piece of equipment    Resistance Training Performed Yes    VAD Patient? No    PAD/SET Patient? No      Pain Assessment   Currently in Pain? No/denies              Social History   Tobacco Use  Smoking Status Former Smoker  . Packs/day: 1.00  . Years: 30.00  . Pack years: 30.00  . Types: Cigarettes  . Quit date: 12/14/2001  . Years since quitting: 17.9  Smokeless Tobacco Never Used  Tobacco Comment   quit 12/14/2001    Goals Met:  Independence with exercise equipment Exercise tolerated well No report of cardiac concerns or symptoms Strength training completed today  Goals Unmet:  Not Applicable  Comments: Pt able to follow exercise prescription today without complaint.  Will continue to monitor for progression. ]   Dr. Emily Filbert is Medical Director for [place] and [facility].

## 2019-11-14 ENCOUNTER — Other Ambulatory Visit: Payer: Self-pay

## 2019-11-14 ENCOUNTER — Ambulatory Visit: Payer: Medicare PPO

## 2019-11-14 ENCOUNTER — Encounter: Payer: Medicare PPO | Admitting: *Deleted

## 2019-11-14 DIAGNOSIS — I5032 Chronic diastolic (congestive) heart failure: Secondary | ICD-10-CM

## 2019-11-14 DIAGNOSIS — I5030 Unspecified diastolic (congestive) heart failure: Secondary | ICD-10-CM | POA: Diagnosis not present

## 2019-11-14 NOTE — Telephone Encounter (Signed)
received call from [place] with [facility], who stated that she spoke to [place] with [employer] regarding pt's oxygen. Per Enis Slipper stated that [employer] would picking pt's daytime oxygen up, as an order was placed on 11/10/2019 from our office for nighttime oxygen only.  I do not see record of this order.  I have spoken to [place] with [employer], who stated that there was incorrect documentation in pt's chart, and Mr. Rings oxygen will not be picked up.  I have spoken to pt and made him aware of this information.  Nothing further is needed.

## 2019-11-14 NOTE — Progress Notes (Signed)
Daily Session Note  Patient Details  Name: Ernest Mclean MRN: [id number] Date of Birth: Jun 12, 1940 Referring Provider:     Pulmonary Rehab from 10/29/2019 in [facility] Cardiac and Pulmonary Rehab  Referring Provider Lujean Amel MD      Encounter Date: 11/14/2019  Check In:  Session Check In - 11/14/19 1012      Check-In   Staff Present Nyoka Cowden, RN, BSN, MA;Joseph Hood RCP,RRT,BSRT;Meredith Sherryll Burger, RN BSN    Virtual Visit No    Medication changes reported     No    Fall or balance concerns reported    No    Tobacco Cessation No Change    Warm-up and Cool-down Performed on first and last piece of equipment    Resistance Training Performed Yes    VAD Patient? No    PAD/SET Patient? No      Pain Assessment   Currently in Pain? No/denies              Social History   Tobacco Use  Smoking Status Former Smoker  . Packs/day: 1.00  . Years: 30.00  . Pack years: 30.00  . Types: Cigarettes  . Quit date: 12/14/2001  . Years since quitting: 17.9  Smokeless Tobacco Never Used  Tobacco Comment   quit 12/14/2001    Goals Met:  Independence with exercise equipment Exercise tolerated well No report of cardiac concerns or symptoms Strength training completed today  Goals Unmet:  Not Applicable  Comments: Pt able to follow exercise prescription today without complaint.  Will continue to monitor for progression.   Dr. Emily Filbert is Medical Director for [place] and [facility].

## 2019-11-17 ENCOUNTER — Other Ambulatory Visit: Payer: Self-pay

## 2019-11-17 ENCOUNTER — Ambulatory Visit: Payer: Medicare PPO

## 2019-11-17 ENCOUNTER — Encounter: Payer: Medicare PPO | Admitting: *Deleted

## 2019-11-17 DIAGNOSIS — I5030 Unspecified diastolic (congestive) heart failure: Secondary | ICD-10-CM | POA: Diagnosis not present

## 2019-11-17 DIAGNOSIS — I5032 Chronic diastolic (congestive) heart failure: Secondary | ICD-10-CM

## 2019-11-17 DIAGNOSIS — J449 Chronic obstructive pulmonary disease, unspecified: Secondary | ICD-10-CM

## 2019-11-17 NOTE — Progress Notes (Signed)
Daily Session Note  Patient Details  Name: Ernest Mclean MRN: [id number] Date of Birth: [birth year] Referring Provider:     Pulmonary Rehab from 10/29/2019 in [facility] Cardiac and Pulmonary Rehab  Referring Provider Lujean Amel MD      Encounter Date: 11/17/2019  Check In:  Session Check In - 11/17/19 1026      Check-In   Supervising physician immediately available to respond to emergencies See telemetry face sheet for immediately available ER MD    Location [facility]-Cardiac & Pulmonary Rehab    Staff Present Renita Papa, RN BSN;Joseph Foy [place], [place], ACSM CEP, Exercise Physiologist;Kara Eliezer Bottom, MS Exercise Physiologist    Virtual Visit No    Medication changes reported     No    Fall or balance concerns reported    No    Warm-up and Cool-down Performed on first and last piece of equipment    Resistance Training Performed Yes    VAD Patient? No    PAD/SET Patient? No      Pain Assessment   Currently in Pain? No/denies              Social History   Tobacco Use  Smoking Status Former Smoker  . Packs/day: 1.00  . Years: 30.00  . Pack years: 30.00  . Types: Cigarettes  . Quit date: 12/14/2001  . Years since quitting: 17.9  Smokeless Tobacco Never Used  Tobacco Comment   quit 12/14/2001    Goals Met:  Independence with exercise equipment Exercise tolerated well No report of cardiac concerns or symptoms Strength training completed today  Goals Unmet:  Not Applicable  Comments: Pt able to follow exercise prescription today without complaint.  Will continue to monitor for progression.    Dr. Emily Filbert is Medical Director for [place] and [facility].

## 2019-11-18 ENCOUNTER — Ambulatory Visit (INDEPENDENT_AMBULATORY_CARE_PROVIDER_SITE_OTHER): Payer: Medicare PPO

## 2019-11-18 ENCOUNTER — Other Ambulatory Visit: Payer: Self-pay

## 2019-11-18 DIAGNOSIS — R0600 Dyspnea, unspecified: Secondary | ICD-10-CM

## 2019-11-18 DIAGNOSIS — R0609 Other forms of dyspnea: Secondary | ICD-10-CM

## 2019-11-18 NOTE — Addendum Note (Signed)
Addended by: Claudette Head A on: 11/18/2019 09:57 AM   Modules accepted: Orders

## 2019-11-18 NOTE — Progress Notes (Signed)
Pt in office for qualifying walk. Pt maintained at 2L O2.

## 2019-11-18 NOTE — Telephone Encounter (Signed)
Pt in office for qualifying walk test. Pt maintained at 2L. Order has been placed to [facility] to correct oxygen order placed by hospital.   Will route to Dr. [name] as a Juluis Rainier

## 2019-11-19 ENCOUNTER — Ambulatory Visit: Payer: Medicare PPO

## 2019-11-19 DIAGNOSIS — J449 Chronic obstructive pulmonary disease, unspecified: Secondary | ICD-10-CM

## 2019-11-19 DIAGNOSIS — I5030 Unspecified diastolic (congestive) heart failure: Secondary | ICD-10-CM | POA: Diagnosis not present

## 2019-11-19 DIAGNOSIS — I5032 Chronic diastolic (congestive) heart failure: Secondary | ICD-10-CM

## 2019-11-19 NOTE — Progress Notes (Signed)
Daily Session Note  Patient Details  Name: Ernest Mclean MRN: [id number] Date of Birth: 06/22/1940 Referring Provider:     Pulmonary Rehab from 10/29/2019 in [facility] Cardiac and Pulmonary Rehab  Referring Provider Callwood, Dwayne MD      Encounter Date: 11/19/2019  Check In:  Session Check In - 11/19/19 1008      Check-In   Supervising physician immediately available to respond to emergencies See telemetry face sheet for immediately available ER MD    Location [facility]-Cardiac & Pulmonary Rehab    Staff Present Joseph Hood RCP,RRT,BSRT;Leslie Castrejon RN, BSN;Jessica Hawkins, MA, RCEP, CCRP, CCET;Kara Langdon, MS Exercise Physiologist    Virtual Visit No    Medication changes reported     No    Fall or balance concerns reported    No    Warm-up and Cool-down Performed on first and last piece of equipment    Resistance Training Performed Yes    VAD Patient? No    PAD/SET Patient? No      Pain Assessment   Currently in Pain? No/denies              Social History   Tobacco Use  Smoking Status Former Smoker  . Packs/day: 1.00  . Years: 30.00  . Pack years: 30.00  . Types: Cigarettes  . Quit date: 12/14/2001  . Years since quitting: 17.9  Smokeless Tobacco Never Used  Tobacco Comment   quit 12/14/2001    Goals Met:  Proper associated with RPD/PD & O2 Sat Independence with exercise equipment Using PLB without cueing & demonstrates good technique Exercise tolerated well No report of cardiac concerns or symptoms Strength training completed today  Goals Unmet:  Not Applicable  Comments: Pt able to follow exercise prescription today without complaint.  Will continue to monitor for progression.   Dr. Mark Miller is Medical Director for [facility] and [facility]. 

## 2019-11-21 ENCOUNTER — Other Ambulatory Visit: Payer: Self-pay

## 2019-11-21 ENCOUNTER — Encounter: Payer: Medicare PPO | Attending: Internal Medicine | Admitting: *Deleted

## 2019-11-21 ENCOUNTER — Ambulatory Visit: Payer: Medicare PPO

## 2019-11-21 DIAGNOSIS — I5030 Unspecified diastolic (congestive) heart failure: Secondary | ICD-10-CM | POA: Insufficient documentation

## 2019-11-21 DIAGNOSIS — Z87891 Personal history of nicotine dependence: Secondary | ICD-10-CM | POA: Insufficient documentation

## 2019-11-21 DIAGNOSIS — I5032 Chronic diastolic (congestive) heart failure: Secondary | ICD-10-CM

## 2019-11-21 DIAGNOSIS — Z79899 Other long term (current) drug therapy: Secondary | ICD-10-CM | POA: Diagnosis not present

## 2019-11-21 NOTE — Progress Notes (Signed)
Daily Session Note  Patient Details  Name: HAVARD RADIGAN MRN: [id number] Date of Birth: 04/16/1941 Referring Provider:     Pulmonary Rehab from 10/29/2019 in [facility] Cardiac and Pulmonary Rehab  Referring Provider Lujean Amel MD      Encounter Date: 11/21/2019  Check In:  Session Check In - 11/21/19 1006      Check-In   Supervising physician immediately available to respond to emergencies See telemetry face sheet for immediately available ER MD    Location [facility]-Cardiac & Pulmonary Rehab    Staff Present Nyoka Cowden, RN, BSN, MA;Joseph [address] [place], [place], RCEP, CCRP, CCET    Virtual Visit No    Medication changes reported     No    Fall or balance concerns reported    No    Tobacco Cessation No Change    Warm-up and Cool-down Performed on first and last piece of equipment    Resistance Training Performed Yes    VAD Patient? No    PAD/SET Patient? No      Pain Assessment   Currently in Pain? No/denies              Social History   Tobacco Use  Smoking Status Former Smoker  . Packs/day: 1.00  . Years: 30.00  . Pack years: 30.00  . Types: Cigarettes  . Quit date: 12/14/2001  . Years since quitting: 17.9  Smokeless Tobacco Never Used  Tobacco Comment   quit 12/14/2001    Goals Met:  Independence with exercise equipment Exercise tolerated well No report of cardiac concerns or symptoms Strength training completed today  Goals Unmet:  Not Applicable  Comments: Pt able to follow exercise prescription today without complaint.  Will continue to monitor for progression.   Dr. Emily Filbert is Medical Director for [place] and [facility].

## 2019-11-26 ENCOUNTER — Ambulatory Visit: Payer: Medicare PPO

## 2019-11-26 ENCOUNTER — Encounter: Payer: Medicare PPO | Admitting: *Deleted

## 2019-11-26 ENCOUNTER — Other Ambulatory Visit: Payer: Self-pay

## 2019-11-26 DIAGNOSIS — I5032 Chronic diastolic (congestive) heart failure: Secondary | ICD-10-CM

## 2019-11-26 DIAGNOSIS — I5030 Unspecified diastolic (congestive) heart failure: Secondary | ICD-10-CM | POA: Diagnosis not present

## 2019-11-26 NOTE — Progress Notes (Signed)
Daily Session Note  Patient Details  Name: BRYLON BRENNING MRN: [id number] Date of Birth: 1940/07/01 Referring Provider:     Pulmonary Rehab from 10/29/2019 in [facility] Cardiac and Pulmonary Rehab  Referring Provider Lujean Amel MD      Encounter Date: 11/26/2019  Check In:  Session Check In - 11/26/19 1001      Check-In   Supervising physician immediately available to respond to emergencies See telemetry face sheet for immediately available ER MD    Location [facility]-Cardiac & Pulmonary Rehab    Staff Present Renita Papa, RN BSN;Joseph Hood RCP,RRT,BSRT;Amanda Oletta [place], [place], ACSM CEP, Exercise Physiologist    Virtual Visit No    Medication changes reported     No    Fall or balance concerns reported    No    Warm-up and Cool-down Performed on first and last piece of equipment    Resistance Training Performed Yes    VAD Patient? No    PAD/SET Patient? No      Pain Assessment   Currently in Pain? No/denies              Social History   Tobacco Use  Smoking Status Former Smoker  . Packs/day: 1.00  . Years: 30.00  . Pack years: 30.00  . Types: Cigarettes  . Quit date: 12/14/2001  . Years since quitting: 17.9  Smokeless Tobacco Never Used  Tobacco Comment   quit 12/14/2001    Goals Met:  Independence with exercise equipment Exercise tolerated well No report of cardiac concerns or symptoms Strength training completed today  Goals Unmet:  Not Applicable  Comments: Pt able to follow exercise prescription today without complaint.  Will continue to monitor for progression.    Dr. Emily Filbert is Medical Director for [place] and [facility].

## 2019-11-28 ENCOUNTER — Other Ambulatory Visit: Payer: Self-pay

## 2019-11-28 ENCOUNTER — Ambulatory Visit: Payer: Medicare PPO

## 2019-11-28 ENCOUNTER — Encounter: Payer: Medicare PPO | Admitting: *Deleted

## 2019-11-28 DIAGNOSIS — I5030 Unspecified diastolic (congestive) heart failure: Secondary | ICD-10-CM | POA: Diagnosis not present

## 2019-11-28 DIAGNOSIS — I5032 Chronic diastolic (congestive) heart failure: Secondary | ICD-10-CM

## 2019-11-28 NOTE — Progress Notes (Signed)
Daily Session Note  Patient Details  Name: Ernest Mclean MRN: [id number] Date of Birth: May 05, 1941 Referring Provider:     Pulmonary Rehab from 10/29/2019 in [facility] Cardiac and Pulmonary Rehab  Referring Provider Lujean Amel MD      Encounter Date: 11/28/2019  Check In:      Social History   Tobacco Use  Smoking Status Former Smoker  . Packs/day: 1.00  . Years: 30.00  . Pack years: 30.00  . Types: Cigarettes  . Quit date: 12/14/2001  . Years since quitting: 17.9  Smokeless Tobacco Never Used  Tobacco Comment   quit 12/14/2001    Goals Met:  Independence with exercise equipment Exercise tolerated well  Goals Unmet:  Not Applicable  Comments: Pt able to follow exercise prescription today without complaint.  Will continue to monitor for progression.   Dr. Emily Filbert is Medical Director for [place] and [facility].

## 2019-12-01 ENCOUNTER — Other Ambulatory Visit: Payer: Self-pay

## 2019-12-01 ENCOUNTER — Ambulatory Visit: Payer: Medicare PPO

## 2019-12-01 ENCOUNTER — Encounter: Payer: Medicare PPO | Admitting: *Deleted

## 2019-12-01 DIAGNOSIS — I5030 Unspecified diastolic (congestive) heart failure: Secondary | ICD-10-CM | POA: Diagnosis not present

## 2019-12-01 DIAGNOSIS — I5032 Chronic diastolic (congestive) heart failure: Secondary | ICD-10-CM

## 2019-12-01 NOTE — Progress Notes (Signed)
Daily Session Note  Patient Details  Name: Ernest Mclean MRN: [id number] Date of Birth: 02/09/1941 Referring Provider:     Pulmonary Rehab from 10/29/2019 in [facility] Cardiac and Pulmonary Rehab  Referring Provider Callwood, Dwayne MD      Encounter Date: 12/01/2019  Check In:  Session Check In - 12/01/19 1032      Check-In   Supervising physician immediately available to respond to emergencies See telemetry face sheet for immediately available ER MD    Location [facility]-Cardiac & Pulmonary Rehab    Staff Present Susanne Bice, RN, BSN, CCRP;Kelly Hayes, BS, ACSM CEP, Exercise Physiologist;Joseph Hood RCP,RRT,BSRT    Virtual Visit No    Medication changes reported     No    Fall or balance concerns reported    No    Warm-up and Cool-down Performed on first and last piece of equipment    Resistance Training Performed Yes    VAD Patient? No    PAD/SET Patient? No      Pain Assessment   Currently in Pain? No/denies              Social History   Tobacco Use  Smoking Status Former Smoker  . Packs/day: 1.00  . Years: 30.00  . Pack years: 30.00  . Types: Cigarettes  . Quit date: 12/14/2001  . Years since quitting: 17.9  Smokeless Tobacco Never Used  Tobacco Comment   quit 12/14/2001    Goals Met:  Proper associated with RPD/PD & O2 Sat Independence with exercise equipment Exercise tolerated well No report of cardiac concerns or symptoms  Goals Unmet:  Not Applicable  Comments: Pt able to follow exercise prescription today without complaint.  Will continue to monitor for progression.    Dr. Mark Miller is Medical Director for [facility] and [facility]. 

## 2019-12-03 ENCOUNTER — Encounter: Payer: Medicare PPO | Admitting: *Deleted

## 2019-12-03 ENCOUNTER — Encounter: Payer: Self-pay | Admitting: *Deleted

## 2019-12-03 ENCOUNTER — Other Ambulatory Visit: Payer: Self-pay

## 2019-12-03 ENCOUNTER — Ambulatory Visit: Payer: Medicare PPO

## 2019-12-03 DIAGNOSIS — I5032 Chronic diastolic (congestive) heart failure: Secondary | ICD-10-CM

## 2019-12-03 DIAGNOSIS — I5030 Unspecified diastolic (congestive) heart failure: Secondary | ICD-10-CM | POA: Diagnosis not present

## 2019-12-03 NOTE — Progress Notes (Signed)
Pulmonary Individual Treatment Plan  Patient Details  Name: Ernest Mclean MRN: [id number] Date of Birth: [birth year] Referring Provider:     Pulmonary Rehab from 10/29/2019 in [facility] Cardiac and Pulmonary Rehab  Referring Provider Lujean Amel MD      Initial Encounter Date:    Pulmonary Rehab from 10/29/2019 in [facility] Cardiac and Pulmonary Rehab  Date 10/29/19      Visit Diagnosis: Heart failure, diastolic, chronic ([place])  Patient's Home Medications on Admission:  Current Outpatient Medications:  .  albuterol (VENTOLIN HFA) 108 (90 Base) MCG/ACT inhaler, Inhale 2 puffs into the lungs every 4 (four) hours as needed for wheezing or shortness of breath., Disp: 18 g, Rfl: 3 .  amLODipine (NORVASC) 5 MG tablet, Take 5 mg by mouth daily. , Disp: , Rfl:  .  apixaban (ELIQUIS) 5 MG TABS tablet, Take 2.5 mg by mouth 2 (two) times daily. , Disp: , Rfl:  .  aspirin EC 81 MG tablet, Take 81 mg by mouth daily., Disp: , Rfl:  .  atorvastatin (LIPITOR) 20 MG tablet, Take by mouth., Disp: , Rfl:  .  BD INSULIN SYRINGE U/F 31G X 5/16" 0.5 ML MISC, , Disp: , Rfl:  .  citalopram (CELEXA) 20 MG tablet, Take 20 mg by mouth at bedtime. , Disp: , Rfl: 5 .  CRANBERRY PO, Take 820 mg by mouth daily., Disp: , Rfl:  .  cyanocobalamin 500 MCG tablet, Take 500 mcg by mouth daily., Disp: , Rfl:  .  diazepam (VALIUM) 5 MG tablet, Take 5 mg by mouth every 12 (twelve) hours as needed for anxiety. , Disp: , Rfl:  .  ergocalciferol (VITAMIN D2) 50000 units capsule, Take 50,000 Units by mouth every 30 (thirty) days., Disp: , Rfl:  .  furosemide (LASIX) 20 MG tablet, Take 20 mg by mouth daily. , Disp: , Rfl:  .  HUMALOG 100 UNIT/ML injection, Inject 10-25 Units into the skin 3 (three) times daily with meals. (sliding scale as needed to a maximum of 150u daily), Disp: , Rfl:  .  insulin glargine (LANTUS) 100 UNIT/ML injection, Inject 60 Units into the skin at bedtime. , Disp: , Rfl:  .  lisinopril (PRINIVIL,ZESTRIL) 5 MG  tablet, Take 5 mg by mouth daily., Disp: , Rfl:  .  metFORMIN (GLUCOPHAGE-XR) 500 MG 24 hr tablet, Take 1,000 mg by mouth daily with supper., Disp: , Rfl:  .  metoprolol tartrate (LOPRESSOR) 100 MG tablet, Take 0.5 tablets (50 mg total) by mouth 2 (two) times daily. This is a decrease from your previous 100 mg twice a day., Disp: , Rfl: 0 .  OMEGA-3 FATTY ACIDS-VITAMIN E PO, Take 1 capsule by mouth daily., Disp: , Rfl:  .  omeprazole (PRILOSEC) 20 MG capsule, Take 20 mg by mouth daily., Disp: , Rfl:  .  pravastatin (PRAVACHOL) 20 MG tablet, Take 20 mg by mouth every evening., Disp: , Rfl:  .  pregabalin (LYRICA) 25 MG capsule, Take 25 mg by mouth 2 (two) times daily., Disp: , Rfl:  .  tiotropium (SPIRIVA) 18 MCG inhalation capsule, Place 18 mcg into inhaler and inhale daily., Disp: , Rfl:  .  vitamin C (ASCORBIC ACID) 500 MG tablet, Take 500 mg by mouth daily., Disp: , Rfl:  .  WIXELA INHUB 500-50 MCG/DOSE AEPB, USE 1 INHALATION BY MOUTH TWICE DAILY (Patient taking differently: Inhale 1 puff into the lungs 2 (two) times daily. ), Disp: 180 each, Rfl: 2  Past Medical History: Past Medical History:  Diagnosis Date  . Atrial fibrillation ([place])   . Basal cell carcinoma 06/2017   Left Ear  . CAD (coronary artery disease)   . CKD (chronic kidney disease)   . Congestive heart failure ([place])   . COPD (chronic obstructive pulmonary disease) ([place])   . Diabetes ([place])   . Hypertension   . OSA on CPAP   . 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 Packs/day: 1.00  . Years: 30.00  . Pack years: 30.00  . Types: Cigarettes  . Quit date: 12/14/2001  . Years since quitting: 17.9  Smokeless Tobacco Never Used  Tobacco Comment   quit 12/14/2001    Labs: Recent Review Flowsheet Data    Labs for ITP Cardiac and Pulmonary Rehab Latest Ref Rng & Units 06/23/2014 05/13/2015 08/01/2016 01/03/2019 09/28/2019   Cholestrol 0 - 200  mg/dL - - - - 101   LDLCALC 0 - 99 mg/dL - - - - 60   HDL >40 mg/dL - - - - 28(L)   Trlycerides <150 mg/dL - - - - 67   Hemoglobin A1c 4.8 - 5.6 % 10.8(H) 7.9(H) 8.4(H) 7.9(H) 8.4(H)   HCO3 20.0 - 28.0 mmol/L - - 32.4(H) 27.2 -   O2SAT % - - - 79.7 -       Pulmonary Assessment Scores:  Pulmonary Assessment Scores    Row Name 10/29/19 1130         ADL UCSD   ADL Phase Entry     SOB Score total 30     Rest 0     Walk 3     Stairs 2     Bath 0     Dress 0     Shop 1       CAT Score   CAT Score 7       mMRC Score   mMRC Score 3            UCSD: Self-administered rating of dyspnea associated with activities of daily living (ADLs) 6-point scale (0 = "not at all" to 5 = "maximal or unable to do because of breathlessness")  Scoring Scores range from 0 to 120.  Minimally important difference is 5 units  CAT: CAT can identify the health impairment of COPD patients and is better correlated with disease progression.  CAT has a scoring range of zero to 40. The CAT score is classified into four groups of low (less than 10), medium (10 - 20), high (21-30) and very high (31-40) based on the impact level of disease on health status. A CAT score over 10 suggests significant symptoms.  A worsening CAT score could be explained by an exacerbation, poor medication adherence, poor inhaler technique, or progression of COPD or comorbid conditions.  CAT MCID is 2 points  mMRC: mMRC (Modified Medical Research Council) Dyspnea Scale is used to assess the degree of baseline functional disability in patients of respiratory disease due to dyspnea. No minimal important difference is established. A decrease in score of 1 point or greater is considered a positive change.   Pulmonary Function Assessment:  Pulmonary Function Assessment - 10/16/19 0938      Breath   Shortness of Breath Yes;Limiting activity           Exercise Target Goals: Exercise Program Goal: Individual exercise  prescription set using results from initial 6 min walk test and THRR while  considering  patient's activity barriers and safety.   Exercise Prescription Goal: Initial exercise prescription builds to 30-45 minutes a day of aerobic activity, 2-3 days per week.  Home exercise guidelines will be given to patient during program as part of exercise prescription that the participant will acknowledge.  Education: Aerobic Exercise & Resistance Training: - Gives group verbal and written instruction on the various components of exercise. Focuses on aerobic and resistive training programs and the benefits of this training and how to safely progress through these programs..   Pulmonary Rehab from 07/10/2018 in [facility] Cardiac and Pulmonary Rehab  Date 06/14/18  Educator [facility]  Instruction Review Code 1- Verbalizes Understanding      Education: Exercise & Equipment Safety: - Individual verbal instruction and demonstration of equipment use and safety with use of the equipment.   Pulmonary Rehab from 12/03/2019 in [facility] Cardiac and Pulmonary Rehab  Date 10/16/19  Educator [facility]  Instruction Review Code 1- Verbalizes Understanding      Education: Exercise Physiology & General Exercise Guidelines: - Group verbal and written instruction with models to review the exercise physiology of the cardiovascular system and associated critical values. Provides general exercise guidelines with specific guidelines to those with heart or lung disease.    Pulmonary Rehab from 12/03/2019 in [facility] Cardiac and Pulmonary Rehab  Date 12/03/19  Educator [facility]  Instruction Review Code 1- Verbalizes Understanding      Education: Flexibility, Balance, Mind/Body Relaxation: Provides group verbal/written instruction on the benefits of flexibility and balance training, including mind/body exercise modes such as yoga, pilates and tai chi.  Demonstration and skill practice provided.   Pulmonary Rehab from 07/10/2018 in [facility] Cardiac and Pulmonary  Rehab  Date 06/19/18  Educator [facility]  Instruction Review Code 1- Verbalizes Understanding      Activity Barriers & Risk Stratification:  Activity Barriers & Cardiac Risk Stratification - 10/29/19 1121      Activity Barriers & Cardiac Risk Stratification   Activity Barriers Arthritis;Shortness of Breath;Balance Concerns;Deconditioning;Muscular Weakness           6 Minute Walk:  6 Minute Walk    Row Name 10/29/19 1103         6 Minute Walk   Phase Initial     Distance 815 feet     Walk Time 5.88 minutes     # of Rest Breaks 1  7 sec     MPH 1.57     METS 1.5     RPE 15     Perceived Dyspnea  3     VO2 Peak 5.23     Symptoms Yes (comment)     Comments calf pain 8/10     Resting HR 56 bpm     Resting BP 108/64     Resting Oxygen Saturation  96 %     Exercise Oxygen Saturation  during 6 min walk 88 %     Max Ex. HR 88 bpm     Max Ex. BP 134/56     2 Minute Post BP 128/70       Interval HR   1 Minute HR 64     2 Minute HR 66     3 Minute HR 72     4 Minute HR 81     5 Minute HR 72     6 Minute HR 88     2 Minute Post HR 67     Interval Heart Rate? Yes  Interval Oxygen   Interval Oxygen? Yes     Baseline Oxygen Saturation % 96 %     1 Minute Oxygen Saturation % 89 %     1 Minute Liters of Oxygen 2 L  continuous     2 Minute Oxygen Saturation % 88 %     2 Minute Liters of Oxygen 2 L     3 Minute Oxygen Saturation % 88 %     3 Minute Liters of Oxygen 2 L     4 Minute Oxygen Saturation % 89 %     4 Minute Liters of Oxygen 2 L     5 Minute Oxygen Saturation % 90 %     5 Minute Liters of Oxygen 0 L     6 Minute Oxygen Saturation % 89 %     6 Minute Liters of Oxygen 0 L     2 Minute Post Oxygen Saturation % 96 %     2 Minute Post Liters of Oxygen 2 L           Oxygen Initial Assessment:  Oxygen Initial Assessment - 10/16/19 0937      Home Oxygen   Home Oxygen Device Home Concentrator;Portable Concentrator;E-Tanks    Sleep Oxygen Prescription  Continuous;CPAP    Liters per minute 2    Home Exercise Oxygen Prescription Continuous    Liters per minute 2    Home at Rest Exercise Oxygen Prescription None    Compliance with Home Oxygen Use Yes      Initial 6 min Walk   Oxygen Used Continuous    Liters per minute 2      Program Oxygen Prescription   Program Oxygen Prescription Continuous    Liters per minute 2      Intervention   Short Term Goals To learn and exhibit compliance with exercise, home and travel O2 prescription;To learn and understand importance of monitoring SPO2 with pulse oximeter and demonstrate accurate use of the pulse oximeter.;To learn and understand importance of maintaining oxygen saturations>88%;To learn and demonstrate proper pursed lip breathing techniques or other breathing techniques.;To learn and demonstrate proper use of respiratory medications    Long  Term Goals Exhibits compliance with exercise, home and travel O2 prescription;Verbalizes importance of monitoring SPO2 with pulse oximeter and return demonstration;Maintenance of O2 saturations>88%;Exhibits proper breathing techniques, such as pursed lip breathing or other method taught during program session;Compliance with respiratory medication;Demonstrates proper use of MDI's           Oxygen Re-Evaluation:  Oxygen Re-Evaluation    Row Name 11/03/19 1102 11/19/19 1014           Program Oxygen Prescription   Program Oxygen Prescription Continuous Continuous      Liters per minute 2 2        Home Oxygen   Home Oxygen Device Home Concentrator;Portable Concentrator;E-Tanks Home Concentrator;E-Tanks      Sleep Oxygen Prescription Continuous;CPAP Continuous;CPAP      Liters per minute 2 2      Home Exercise Oxygen Prescription Continuous Continuous      Liters per minute 2 2      Home at Rest Exercise Oxygen Prescription None None      Liters per minute -- 0      Compliance with Home Oxygen Use Yes Yes        Goals/Expected Outcomes    Short Term Goals To learn and exhibit compliance with exercise, home and travel O2 prescription;To learn and understand  importance of monitoring SPO2 with pulse oximeter and demonstrate accurate use of the pulse oximeter.;To learn and understand importance of maintaining oxygen saturations>88%;To learn and demonstrate proper pursed lip breathing techniques or other breathing techniques.;To learn and demonstrate proper use of respiratory medications To learn and demonstrate proper use of respiratory medications;To learn and understand importance of monitoring SPO2 with pulse oximeter and demonstrate accurate use of the pulse oximeter.;To learn and exhibit compliance with exercise, home and travel O2 prescription;To learn and understand importance of maintaining oxygen saturations>88%;To learn and demonstrate proper pursed lip breathing techniques or other breathing techniques.      Long  Term Goals Exhibits compliance with exercise, home and travel O2 prescription;Verbalizes importance of monitoring SPO2 with pulse oximeter and return demonstration;Maintenance of O2 saturations>88%;Exhibits proper breathing techniques, such as pursed lip breathing or other method taught during program session;Compliance with respiratory medication;Demonstrates proper use of MDI's Exhibits compliance with exercise, home and travel O2 prescription;Maintenance of O2 saturations>88%;Verbalizes importance of monitoring SPO2 with pulse oximeter and return demonstration;Exhibits proper breathing techniques, such as pursed lip breathing or other method taught during program session;Demonstrates proper use of MDI's;Compliance with respiratory medication      Comments Reviewed PLB technique with pt.  Talked about how it works and it's importance in maintaining their exercise saturations. When Lorcan is resting at home he does not require oxygen. He has seen Dr. [name] yesterday and he stated he does not need oxygen at rest. Fritz Pickerel does not  want to wear oxygen everytime he goes out but knows that he needs to wear 2 liters on exertion.      Goals/Expected Outcomes Short: Become more profiecient at using PLB.   Long: Become independent at using PLB. Short: use oxygen on exertion at all times. Long: maintain oxygen saturations at home at rest and exertion independently             Oxygen Discharge (Final Oxygen Re-Evaluation):  Oxygen Re-Evaluation - 11/19/19 1014      Program Oxygen Prescription   Program Oxygen Prescription Continuous    Liters per minute 2      Home Oxygen   Home Oxygen Device Home Concentrator;E-Tanks    Sleep Oxygen Prescription Continuous;CPAP    Liters per minute 2    Home Exercise Oxygen Prescription Continuous    Liters per minute 2    Home at Rest Exercise Oxygen Prescription None    Liters per minute 0    Compliance with Home Oxygen Use Yes      Goals/Expected Outcomes   Short Term Goals To learn and demonstrate proper use of respiratory medications;To learn and understand importance of monitoring SPO2 with pulse oximeter and demonstrate accurate use of the pulse oximeter.;To learn and exhibit compliance with exercise, home and travel O2 prescription;To learn and understand importance of maintaining oxygen saturations>88%;To learn and demonstrate proper pursed lip breathing techniques or other breathing techniques.    Long  Term Goals Exhibits compliance with exercise, home and travel O2 prescription;Maintenance of O2 saturations>88%;Verbalizes importance of monitoring SPO2 with pulse oximeter and return demonstration;Exhibits proper breathing techniques, such as pursed lip breathing or other method taught during program session;Demonstrates proper use of MDI's;Compliance with respiratory medication    Comments When Fahd is resting at home he does not require oxygen. He has seen Dr. [name] yesterday and he stated he does not need oxygen at rest. Fritz Pickerel does not want to wear oxygen everytime he goes  out but knows that he needs to wear 2 liters  on exertion.    Goals/Expected Outcomes Short: use oxygen on exertion at all times. Long: maintain oxygen saturations at home at rest and exertion independently           Initial Exercise Prescription:  Initial Exercise Prescription - 10/29/19 1100      Date of Initial Exercise RX and Referring Provider   Date 10/29/19    Referring Provider Lujean Amel MD      Oxygen   Oxygen Continuous    Liters 2      Recumbant Bike   Level 1    RPM 50    Watts 16    Minutes 15    METs 1.5      NuStep   Level 1    SPM 80    Minutes 15    METs 1.5      Arm Ergometer   Level 1    Watts 23    RPM 25    Minutes 15    METs 1.5      Biostep-RELP   Level 1    SPM 50    Minutes 15    METs 1      Prescription Details   Frequency (times per week) 3    Duration Progress to 30 minutes of continuous aerobic without signs/symptoms of physical distress      Intensity   THRR 40-80% of Max Heartrate 91-126    Ratings of Perceived Exertion 11-13    Perceived Dyspnea 0-4      Progression   Progression Continue to progress workloads to maintain intensity without signs/symptoms of physical distress.      Resistance Training   Training Prescription Yes    Weight 3 lb    Reps 10-15           Perform Capillary Blood Glucose checks as needed.  Exercise Prescription Changes:  Exercise Prescription Changes    Row Name 10/29/19 1100 11/06/19 1400 11/19/19 0900         Response to Exercise   Blood Pressure (Admit) 108/64 136/70 138/74     Blood Pressure (Exercise) 134/56 140/70 152/64     Blood Pressure (Exit) 124/66 132/68 122/64     Heart Rate (Admit) 56 bpm 61 bpm 64 bpm     Heart Rate (Exercise) 81 bpm 66 bpm 72 bpm     Heart Rate (Exit) 61 bpm 61 bpm 68 bpm     Oxygen Saturation (Admit) 96 % 98 % 91 %     Oxygen Saturation (Exercise) 88 % 95 % 90 %     Oxygen Saturation (Exit) 97 % 98 % 97 %     Rating of Perceived Exertion  (Exercise) _0 Perceived Dyspnea (Exercise) _1 Symptoms calf pain 8/10 none none     Comments walk test results -- --     Duration -- Progress to 30 minutes of  aerobic without signs/symptoms of physical distress Continue with 30 min of aerobic exercise without signs/symptoms of physical distress.     Intensity -- THRR unchanged THRR unchanged       Progression   Progression -- Continue to progress workloads to maintain intensity without signs/symptoms of physical distress. Continue to progress workloads to maintain intensity without signs/symptoms of physical distress.     Average METs -- 2.85 3       Resistance Training   Training Prescription -- Yes Yes     Weight --  3 lb 3 lb     Reps -- 10-15 10-15       Interval Training   Interval Training -- -- No       Oxygen   Oxygen -- Continuous Continuous     Liters -- 2 2       Recumbant Bike   Level -- 1 1     RPM -- 50 --     Watts -- 27 18     Minutes -- 15 15     METs -- 2.85 --       NuStep   Level -- -- 5     Minutes -- -- 15     METs -- -- 3.1       Arm Ergometer   Level -- -- 1     Minutes -- -- 15       Biostep-RELP   Level -- -- 3     Minutes -- -- 15     METs -- -- 3            Exercise Comments:   Exercise Goals and Review:  Exercise Goals    Row Name 10/29/19 1125             Exercise Goals   Increase Physical Activity Yes       Intervention Provide advice, education, support and counseling about physical activity/exercise needs.;Develop an individualized exercise prescription for aerobic and resistive training based on initial evaluation findings, risk stratification, comorbidities and participant's personal goals.       Expected Outcomes Short Term: Attend rehab on a regular basis to increase amount of physical activity.;Long Term: Add in home exercise to make exercise part of routine and to increase amount of physical activity.;Long Term: Exercising regularly at least 3-5 days  a week.       Increase Strength and Stamina Yes       Intervention Provide advice, education, support and counseling about physical activity/exercise needs.;Develop an individualized exercise prescription for aerobic and resistive training based on initial evaluation findings, risk stratification, comorbidities and participant's personal goals.       Expected Outcomes Short Term: Increase workloads from initial exercise prescription for resistance, speed, and METs.;Short Term: Perform resistance training exercises routinely during rehab and add in resistance training at home;Long Term: Improve cardiorespiratory fitness, muscular endurance and strength as measured by increased METs and functional capacity (6MWT)       Able to understand and use rate of perceived exertion (RPE) scale Yes       Intervention Provide education and explanation on how to use RPE scale       Expected Outcomes Long Term:  Able to use RPE to guide intensity level when exercising independently;Short Term: Able to use RPE daily in rehab to express subjective intensity level       Able to understand and use Dyspnea scale Yes       Intervention Provide education and explanation on how to use Dyspnea scale       Expected Outcomes Short Term: Able to use Dyspnea scale daily in rehab to express subjective sense of shortness of breath during exertion;Long Term: Able to use Dyspnea scale to guide intensity level when exercising independently       Knowledge and understanding of Target Heart Rate Range (THRR) Yes       Intervention Provide education and explanation of THRR including how the numbers were predicted and where they are located for reference  Expected Outcomes Short Term: Able to state/look up THRR;Short Term: Able to use daily as guideline for intensity in rehab;Long Term: Able to use THRR to govern intensity when exercising independently       Able to check pulse independently Yes       Intervention Provide education  and demonstration on how to check pulse in carotid and radial arteries.;Review the importance of being able to check your own pulse for safety during independent exercise       Expected Outcomes Short Term: Able to explain why pulse checking is important during independent exercise;Long Term: Able to check pulse independently and accurately       Understanding of Exercise Prescription Yes       Intervention Provide education, explanation, and written materials on patient's individual exercise prescription       Expected Outcomes Short Term: Able to explain program exercise prescription;Long Term: Able to explain home exercise prescription to exercise independently       Improve claudication pain toleration; Improve walking ability Yes       Intervention Attend education sessions to aid in risk factor modification and understanding of disease process       Expected Outcomes Short Term: Improve walking distance/time to onset of claudication pain;Long Term: Improve walking ability and toleration to claudication              Exercise Goals Re-Evaluation :  Exercise Goals Re-Evaluation    Row Name 11/03/19 1101 11/06/19 1408 11/19/19 0957 11/19/19 1011       Exercise Goal Re-Evaluation   Exercise Goals Review Increase Physical Activity;Increase Strength and Stamina;Able to understand and use Dyspnea scale;Able to check pulse independently;Understanding of Exercise Prescription;Knowledge and understanding of Target Heart Rate Range (THRR);Able to understand and use rate of perceived exertion (RPE) scale -- Increase Physical Activity;Increase Strength and Stamina;Understanding of Exercise Prescription Increase Physical Activity;Increase Strength and Stamina    Comments Reviewed RPE and dyspnea scales, THR and program prescription with pt today.  Pt voiced understanding and was given a copy of goals to take home. Colbie is tolerating exercise well in his first week.  Staff will monitor progress. [place]ford is  doing well in rehab.  He tried to go without oxygen on Monday, but needed it once into full exertion.  He is up to level 5 on the NuStep.  We will continue to monitor his progress. Atsushi is doing weight at home but is still going through reovery from his pneumonia. He walks at home in his house at the moment. He is working hard in rehab and is willing to push himself while maintaining his shortness of breath.    Expected Outcomes Short: Use RPE daily to regulate intensity. Long: Follow program prescription in THR. Short: attend consistently Long: build overall stamina Short: Increase BioStep  Long; Continue to rebuild stamina Short: continue to exercise more frequently. Long: maintain a more vigarous exercise routine independently at home.           Discharge Exercise Prescription (Final Exercise Prescription Changes):  Exercise Prescription Changes - 11/19/19 0900      Response to Exercise   Blood Pressure (Admit) 138/74    Blood Pressure (Exercise) 152/64    Blood Pressure (Exit) 122/64    Heart Rate (Admit) 64 bpm    Heart Rate (Exercise) 72 bpm    Heart Rate (Exit) 68 bpm    Oxygen Saturation (Admit) 91 %    Oxygen Saturation (Exercise) 90 %    Oxygen  Saturation (Exit) 97 %    Rating of Perceived Exertion (Exercise) 15    Perceived Dyspnea (Exercise) 3    Symptoms none    Duration Continue with 30 min of aerobic exercise without signs/symptoms of physical distress.    Intensity THRR unchanged      Progression   Progression Continue to progress workloads to maintain intensity without signs/symptoms of physical distress.    Average METs 3      Resistance Training   Training Prescription Yes    Weight 3 lb    Reps 10-15      Interval Training   Interval Training No      Oxygen   Oxygen Continuous    Liters 2      Recumbant Bike   Level 1    Watts 18    Minutes 15      NuStep   Level 5    Minutes 15    METs 3.1      Arm Ergometer   Level 1    Minutes 15       Biostep-RELP   Level 3    Minutes 15    METs 3           Nutrition:  Target Goals: Understanding of nutrition guidelines, daily intake of sodium <1550m, cholesterol <2059m calories 30% from fat and 7% or less from saturated fats, daily to have 5 or more servings of fruits and vegetables.  Education: Controlling Sodium/Reading Food Labels -Group verbal and written material supporting the discussion of sodium use in heart healthy nutrition. Review and explanation with models, verbal and written materials for utilization of the food label.   [facility]  Date 07/10/18  Educator [facility] Review Code 1- Verbalizes Understanding      Education: General Nutrition Guidelines/Fats and Fiber: -Group instruction provided by verbal, written material, models and posters to present the general guidelines for heart healthy nutrition. Gives an explanation and review of dietary fats and fiber.   [facility]  Date 07/03/18  [facility] Review Code 1- Verbalizes Understanding      Biometrics:  Pre Biometrics - 10/29/19 1126      Pre Biometrics   Height 6' 0.5" (1.842 m)    Weight 223 lb 8 oz (101.4 kg)    BMI (Calculated) 29.88    Single Leg Stand 0 seconds            Nutrition Therapy Plan and Nutrition Goals:   Nutrition Assessments:  Nutrition Assessments - 10/29/19 1127      MEDFICTS Scores   Pre Score 42           MEDIFICTS Score Key:          ?70 Need to make dietary changes          40-70 Heart Healthy Diet         ? 40 Therapeutic Level Cholesterol Diet  Nutrition Goals Re-Evaluation:  Nutrition Goals Re-Evaluation    RoStauntoname 11/19/19 1021             Goals   Current Weight 225 lb (102.1 kg)       Nutrition Goal Lose weight       Comment LaJoanthanants to lose weight to help with his breathing. He states that he loves food and  over eats at times.  Expected Outcome Short: cut back on meal portions. Long: lose 10 pounds by the end of Rehab              Nutrition Goals Discharge (Final Nutrition Goals Re-Evaluation):  Nutrition Goals Re-Evaluation - 11/19/19 1021      Goals   Current Weight 225 lb (102.1 kg)    Nutrition Goal Lose weight    Comment Ralphie wants to lose weight to help with his breathing. He states that he loves food and over eats at times.    Expected Outcome Short: cut back on meal portions. Long: lose 10 pounds by the end of Rehab           Psychosocial: Target Goals: Acknowledge presence or absence of significant depression and/or stress, maximize coping skills, provide positive support system. Participant is able to verbalize types and ability to use techniques and skills needed for reducing stress and depression.   Education: Depression - Provides group verbal and written instruction on the correlation between heart/lung disease and depressed mood, treatment options, and the stigmas associated with seeking treatment.   Education: Sleep Hygiene -Provides group verbal and written instruction about how sleep can affect your health.  Define sleep hygiene, discuss sleep cycles and impact of sleep habits. Review good sleep hygiene tips.    Pulmonary Rehab from 07/10/2018 in [facility] Cardiac and Pulmonary Rehab  Date 05/29/18  Educator [facility]  Instruction Review Code 1- Verbalizes Understanding      Education: Stress and Anxiety: - Provides group verbal and written instruction about the health risks of elevated stress and causes of high stress.  Discuss the correlation between heart/lung disease and anxiety and treatment options. Review healthy ways to manage with stress and anxiety.   Pulmonary Rehab from 07/10/2018 in [facility] Cardiac and Pulmonary Rehab  Date 06/26/18  Educator [facility]  Instruction Review Code 1- Verbalizes Understanding      Initial Review & Psychosocial Screening:  Initial  Psych Review & Screening - 10/16/19 0938      Initial Review   Current issues with Current Anxiety/Panic;History of Depression      Family Dynamics   Good Support System? Yes    Comments Rasheed has some anxiety and everything that he has been through with his healthcan make him feel that way. He has got over his fear of dying and is not depressed like he used to me.      Barriers   Psychosocial barriers to participate in program The patient should benefit from training in stress management and relaxation.      Screening Interventions   Interventions Encouraged to exercise;Provide feedback about the scores to participant;To provide support and resources with identified psychosocial needs    Expected Outcomes Short Term goal: Utilizing psychosocial counselor, staff and physician to assist with identification of specific Stressors or current issues interfering with healing process. Setting desired goal for each stressor or current issue identified.;Long Term Goal: Stressors or current issues are controlled or eliminated.;Short Term goal: Identification and review with participant of any Quality of Life or Depression concerns found by scoring the questionnaire.;Long Term goal: The participant improves quality of Life and PHQ9 Scores as seen by post scores and/or verbalization of changes           Quality of Life Scores:  Scores of 19 and below usually indicate a poorer quality of life in these areas.  A difference of  2-3 points is a clinically meaningful difference.  A difference of 2-3 points in  the total score of the Quality of Life Index has been associated with significant improvement in overall quality of life, self-image, physical symptoms, and general health in studies assessing change in quality of life.  PHQ-9: Recent Review Flowsheet Data    Depression screen [facility] 2/9 10/29/2019 04/22/2018 04/01/2018 07/16/2017 03/19/2017   Decreased Interest 1 0 0 0 0   Down, Depressed, Hopeless 1 0 0 0  0   PHQ - 2 Score 2 0 0 0 0   Altered sleeping 0 0 0 - -   Tired, decreased energy 1 0 1 - -   Change in appetite 0 0 0 - -   Feeling bad or failure about yourself  1 0 0 - -   Trouble concentrating 0 0 0 - -   Moving slowly or fidgety/restless 0 0 0 - -   Suicidal thoughts 0 0 0 - -   PHQ-9 Score 4 0 1 - -   Difficult doing work/chores Not difficult at all Not difficult at all Not difficult at all - -     Interpretation of Total Score  Total Score Depression Severity:  1-4 = Minimal depression, 5-9 = Mild depression, 10-14 = Moderate depression, 15-19 = Moderately severe depression, 20-27 = Severe depression   Psychosocial Evaluation and Intervention:  Psychosocial Evaluation - 10/16/19 0940      Psychosocial Evaluation & Interventions   Interventions Encouraged to exercise with the program and follow exercise prescription    Comments Collis has some anxiety and everything that he has been through with his healthcan make him feel that way. He has got over his fear of dying and is not depressed like he used to me.    Expected Outcomes Short: Start [facility] to help with mood. Long: Maintain a healthy mental state.    Continue Psychosocial Services  Follow up required by staff           Psychosocial Re-Evaluation:  Psychosocial Re-Evaluation    Gate Name 11/19/19 1020             Psychosocial Re-Evaluation   Current issues with None Identified       Comments Patient reports no issues with their current mental states, sleep, stress, depression or anxiety. Will follow up with patient in a few weeks for any changes.       Expected Outcomes Short: Continue to exercise regularly to support mental health and notify staff of any changes. Long: maintain mental health and well being through teaching of rehab or prescribed medications independently.       Interventions Encouraged to attend Pulmonary Rehabilitation for the exercise       Continue Psychosocial Services  Follow up required  by staff              Psychosocial Discharge (Final Psychosocial Re-Evaluation):  Psychosocial Re-Evaluation - 11/19/19 1020      Psychosocial Re-Evaluation   Current issues with None Identified    Comments Patient reports no issues with their current mental states, sleep, stress, depression or anxiety. Will follow up with patient in a few weeks for any changes.    Expected Outcomes Short: Continue to exercise regularly to support mental health and notify staff of any changes. Long: maintain mental health and well being through teaching of rehab or prescribed medications independently.    Interventions Encouraged to attend Pulmonary Rehabilitation for the exercise    Continue Psychosocial Services  Follow up required by staff  Education: Education Goals: Education classes will be provided on a weekly basis, covering required topics. Participant will state understanding/return demonstration of topics presented.  Learning Barriers/Preferences:  Learning Barriers/Preferences - 10/16/19 0940      Learning Barriers/Preferences   Learning Barriers Sight    Learning Preferences None           General Pulmonary Education Topics:  Infection Prevention: - Provides verbal and written material to individual with discussion of infection control including proper hand washing and proper equipment cleaning during exercise session.   Pulmonary Rehab from 12/03/2019 in [facility] Cardiac and Pulmonary Rehab  Date 10/16/19  Educator [facility]  Instruction Review Code 1- Verbalizes Understanding      Falls Prevention: - Provides verbal and written material to individual with discussion of falls prevention and safety.   Pulmonary Rehab from 12/03/2019 in [facility] Cardiac and Pulmonary Rehab  Date 10/16/19  Educator [facility]  Instruction Review Code 1- Verbalizes Understanding      Chronic Lung Diseases: - Group verbal and written instruction to review updates, respiratory medications, advancements  in procedures and treatments. Discuss use of supplemental oxygen including available portable oxygen systems, continuous and intermittent flow rates, concentrators, personal use and safety guidelines. Review proper use of inhaler and spacers. Provide informative websites for self-education.    Pulmonary Rehab from 07/10/2018 in [facility] Cardiac and Pulmonary Rehab  Date 06/28/18  Educator [facility]  Instruction Review Code 1- Verbalizes Understanding      Energy Conservation: - Provide group verbal and written instruction for methods to conserve energy, plan and organize activities. Instruct on pacing techniques, use of adaptive equipment and posture/positioning to relieve shortness of breath.   Triggers and Exacerbations: - Group verbal and written instruction to review types of environmental triggers and ways to prevent exacerbations. Discuss weather changes, air quality and the benefits of nasal washing. Review warning signs and symptoms to help prevent infections. Discuss techniques for effective airway clearance, coughing, and vibrations.   Pulmonary Rehab from 07/10/2018 in [facility] Cardiac and Pulmonary Rehab  Date 05/10/18  Educator [facility]  Instruction Review Code 1- Verbalizes Understanding      AED/CPR: - Group verbal and written instruction with the use of models to demonstrate the basic use of the AED with the basic ABC's of resuscitation.   Anatomy and Physiology of the Lungs: - Group verbal and written instruction with the use of models to provide basic lung anatomy and physiology related to function, structure and complications of lung disease.   Pulmonary Rehab from 07/10/2018 in [facility] Cardiac and Pulmonary Rehab  Date 04/05/18  Educator [facility]  Instruction Review Code 1- Verbalizes Understanding      Anatomy & Physiology of the Heart: - Group verbal and written instruction and models provide basic cardiac anatomy and physiology, with the coronary electrical and arterial systems. Review of  Valvular disease and Heart Failure   Pulmonary Rehab from 07/10/2018 in [facility] Cardiac and Pulmonary Rehab  Date 05/24/18  Educator [facility]  Instruction Review Code 1- Verbalizes Understanding      Cardiac Medications: - Group verbal and written instruction to review commonly prescribed medications for heart disease. Reviews the medication, class of the drug, and side effects.   Pulmonary Rehab from 07/10/2018 in [facility] Cardiac and Pulmonary Rehab  Date 05/31/18  Educator [facility]  Instruction Review Code 1- Verbalizes Understanding      Other: -Provides group and verbal instruction on various topics (see comments)   Knowledge Questionnaire Score:  Knowledge Questionnaire Score - 10/29/19  1128      Knowledge Questionnaire Score   Pre Score 17/18 Education Focus: O2 safety            Core Components/Risk Factors/Patient Goals at Admission:  Personal Goals and Risk Factors at Admission - 10/29/19 1128      Core Components/Risk Factors/Patient Goals on Admission    Weight Management Yes;Weight Loss    Intervention Weight Management: Develop a combined nutrition and exercise program designed to reach desired caloric intake, while maintaining appropriate intake of nutrient and fiber, sodium and fats, and appropriate energy expenditure required for the weight goal.;Weight Management: Provide education and appropriate resources to help participant work on and attain dietary goals.    Admit Weight 223 lb 8 oz (101.4 kg)    Goal Weight: Short Term 218 lb (98.9 kg)    Goal Weight: Long Term 213 lb (96.6 kg)    Expected Outcomes Long Term: Adherence to nutrition and physical activity/exercise program aimed toward attainment of established weight goal;Short Term: Continue to assess and modify interventions until short term weight is achieved;Weight Loss: Understanding of general recommendations for a balanced deficit meal plan, which promotes 1-2 lb weight loss per week and includes a negative energy  balance of [phone] kcal/d;Understanding recommendations for meals to include 15-35% energy as protein, 25-35% energy from fat, 35-60% energy from carbohydrates, less than 246m of dietary cholesterol, 20-35 gm of total fiber daily;Understanding of distribution of calorie intake throughout the day with the consumption of 4-5 meals/snacks    Improve shortness of breath with ADL's Yes    Intervention Provide education, individualized exercise plan and daily activity instruction to help decrease symptoms of SOB with activities of daily living.    Expected Outcomes Short Term: Improve cardiorespiratory fitness to achieve a reduction of symptoms when performing ADLs;Long Term: Be able to perform more ADLs without symptoms or delay the onset of symptoms    Diabetes Yes    Intervention Provide education about signs/symptoms and action to take for hypo/hyperglycemia.;Provide education about proper nutrition, including hydration, and aerobic/resistive exercise prescription along with prescribed medications to achieve blood glucose in normal ranges: Fasting glucose 65-99 mg/dL    Expected Outcomes Short Term: Participant verbalizes understanding of the signs/symptoms and immediate care of hyper/hypoglycemia, proper foot care and importance of medication, aerobic/resistive exercise and nutrition plan for blood glucose control.;Long Term: Attainment of HbA1C < 7%.    Heart Failure Yes    Intervention Provide a combined exercise and nutrition program that is supplemented with education, support and counseling about heart failure. Directed toward relieving symptoms such as shortness of breath, decreased exercise tolerance, and extremity edema.    Expected Outcomes Improve functional capacity of life;Short term: Attendance in program 2-3 days a week with increased exercise capacity. Reported lower sodium intake. Reported increased fruit and vegetable intake. Reports medication compliance.;Short term: Daily weights  obtained and reported for increase. Utilizing diuretic protocols set by physician.;Long term: Adoption of self-care skills and reduction of barriers for early signs and symptoms recognition and intervention leading to self-care maintenance.    Hypertension Yes    Intervention Provide education on lifestyle modifcations including regular physical activity/exercise, weight management, moderate sodium restriction and increased consumption of fresh fruit, vegetables, and low fat dairy, alcohol moderation, and smoking cessation.;Monitor prescription use compliance.    Expected Outcomes Short Term: Continued assessment and intervention until BP is < 140/939mHG in hypertensive participants. < 130/8060mG in hypertensive participants with diabetes, heart failure or chronic kidney disease.;Long Term:  Maintenance of blood pressure at goal levels.    Lipids Yes    Intervention Provide education and support for participant on nutrition & aerobic/resistive exercise along with prescribed medications to achieve LDL <35m, HDL >423m    Expected Outcomes Short Term: Participant states understanding of desired cholesterol values and is compliant with medications prescribed. Participant is following exercise prescription and nutrition guidelines.;Long Term: Cholesterol controlled with medications as prescribed, with individualized exercise RX and with personalized nutrition plan. Value goals: LDL < 7089mHDL > 40 mg.           Education:Diabetes - Individual verbal and written instruction to review signs/symptoms of diabetes, desired ranges of glucose level fasting, after meals and with exercise. Acknowledge that pre and post exercise glucose checks will be done for 3 sessions at entry of program.   [facility]  Date 10/16/19  Educator [facility] Code 1- Verbalizes Understanding      Education: Know Your Numbers and Risk Factors: -Group verbal  and written instruction about important numbers in your health.  Discussion of what are risk factors and how they play a role in the disease process.  Review of Cholesterol, Blood Pressure, Diabetes, and BMI and the role they play in your overall health.   [facility]  Date 05/08/18  Educator [facility] Review Code 1- Verbalizes Understanding      Core Components/Risk Factors/Patient Goals Review:   Goals and Risk Factor Review    Row Name 11/19/19 1018             Core Components/Risk Factors/Patient Goals Review   Personal Goals Review Weight Management/Obesity;Heart Failure;Lipids;Hypertension;Diabetes;Improve shortness of breath with ADL's       Review Spoke to patient about their shortness of breath and what they can do to improve. Patient has been informed of breathing techniques when starting the program. Patient is informed to tell staff if they have had any med changes and that certain meds they are taking or not taking can be causing shortness of breath.       Expected Outcomes Short: Attend [facility] regularly to improve shortness of breath with ADL's. Long: maintain independence with ADL's              Core Components/Risk Factors/Patient Goals at Discharge (Final Review):   Goals and Risk Factor Review - 11/19/19 1018      Core Components/Risk Factors/Patient Goals Review   Personal Goals Review Weight Management/Obesity;Heart Failure;Lipids;Hypertension;Diabetes;Improve shortness of breath with ADL's    Review Spoke to patient about their shortness of breath and what they can do to improve. Patient has been informed of breathing techniques when starting the program. Patient is informed to tell staff if they have had any med changes and that certain meds they are taking or not taking can be causing shortness of breath.    Expected Outcomes Short: Attend [facility] regularly to improve shortness of breath with  ADL's. Long: maintain independence with ADL's           ITP Comments:  ITP Comments    Row Name 10/16/19 0942 10/29/19 1102 11/03/19 1100 11/05/19 0636 12/03/19 1103   ITP Comments Virtual Visit completed. Patient informed on EP and RD appointment and 6 Minute walk test. Patient also informed of patient health questionnaires on My Chart. Patient Verbalizes understanding. [facility] Completed 6MWT and gym orientation.  Initial ITP created and sent for review to Dr. Emily Filbert, Medical Director. First full day of exercise!  Patient was oriented to gym and equipment including functions, settings, policies, and procedures.  Patient's individual exercise prescription and treatment plan were reviewed.  All starting workloads were established based on the results of the 6 minute walk test done at initial orientation visit.  The plan for exercise progression was also introduced and progression will be customized based on patient's performance and goals. 30 Day review completed. Medical Director ITP review done, changes made as directed, and signed approval by Medical Director. 30 Day review completed. Medical Director ITP review done, changes made as directed, and signed approval by Medical Director.          Comments:

## 2019-12-03 NOTE — Progress Notes (Signed)
Daily Session Note  Patient Details  Name: Ernest Mclean MRN: [id number] Date of Birth: May 25, 1940 Referring Provider:     Pulmonary Rehab from 10/29/2019 in [facility] Cardiac and Pulmonary Rehab  Referring Provider Lujean Amel MD      Encounter Date: 12/03/2019  Check In:  Session Check In - 12/03/19 0956      Check-In   Supervising physician immediately available to respond to emergencies See telemetry face sheet for immediately available ER MD    Location [facility]-Cardiac & Pulmonary Rehab    Staff Present Heath Lark, RN, BSN, CCRP;Joseph Foy [place], [place], ACSM CEP, Exercise Physiologist    Virtual Visit No    Medication changes reported     No    Fall or balance concerns reported    No    Warm-up and Cool-down Performed on first and last piece of equipment    Resistance Training Performed Yes    VAD Patient? No    PAD/SET Patient? No      Pain Assessment   Currently in Pain? No/denies              Social History   Tobacco Use  Smoking Status Former Smoker  . Packs/day: 1.00  . Years: 30.00  . Pack years: 30.00  . Types: Cigarettes  . Quit date: 12/14/2001  . Years since quitting: 17.9  Smokeless Tobacco Never Used  Tobacco Comment   quit 12/14/2001    Goals Met:  Independence with exercise equipment Exercise tolerated well No report of cardiac concerns or symptoms  Goals Unmet:  Not Applicable  Comments: Pt able to follow exercise prescription today without complaint.  Will continue to monitor for progression.    Dr. Emily Filbert is Medical Director for [place] and [facility].

## 2019-12-04 ENCOUNTER — Telehealth: Payer: Self-pay | Admitting: Internal Medicine

## 2019-12-04 NOTE — Telephone Encounter (Signed)
Spoke to pt, who stated that he is interested in purchasing an inogen. Pt is questioning if he would be okay with pulse dosage, due to his COPD.   Dr. [name], please advise. Thanks

## 2019-12-04 NOTE — Telephone Encounter (Signed)
Yes its ok and see how it helps him

## 2019-12-04 NOTE — Telephone Encounter (Signed)
Pt is aware of below message/recommendations. Pt is aware that pulse dosage can NOT be used during sleep.  Pt voiced his understanding and had no further questions.  Nothing further is needed at this time.

## 2019-12-05 ENCOUNTER — Ambulatory Visit: Payer: Medicare PPO

## 2019-12-05 ENCOUNTER — Other Ambulatory Visit: Payer: Self-pay

## 2019-12-05 DIAGNOSIS — J449 Chronic obstructive pulmonary disease, unspecified: Secondary | ICD-10-CM

## 2019-12-05 DIAGNOSIS — I5030 Unspecified diastolic (congestive) heart failure: Secondary | ICD-10-CM | POA: Diagnosis not present

## 2019-12-05 DIAGNOSIS — I5032 Chronic diastolic (congestive) heart failure: Secondary | ICD-10-CM

## 2019-12-05 NOTE — Progress Notes (Signed)
Daily Session Note  Patient Details  Name: Ernest Mclean MRN: [id number] Date of Birth: 03-17-1941 Referring Provider:     Pulmonary Rehab from 10/29/2019 in [facility] Cardiac and Pulmonary Rehab  Referring Provider Lujean Amel MD      Encounter Date: 12/05/2019  Check In:  Session Check In - 12/05/19 1012      Check-In   Supervising physician immediately available to respond to emergencies See telemetry face sheet for immediately available ER MD    Location [facility]-Cardiac & Pulmonary Rehab    Staff Present Basilia Jumbo, RN, BSN;Jessica Luan Pulling, MA, RCEP, CCRP, CCET;Meredith [place], RN BSN;Joseph Jasper [employer] [place]    Virtual Visit No    Medication changes reported     No    Fall or balance concerns reported    No    Tobacco Cessation No Change    Warm-up and Cool-down Performed on first and last piece of equipment    Resistance Training Performed Yes    VAD Patient? No    PAD/SET Patient? No      Pain Assessment   Currently in Pain? No/denies              Social History   Tobacco Use  Smoking Status Former Smoker  . Packs/day: 1.00  . Years: 30.00  . Pack years: 30.00  . Types: Cigarettes  . Quit date: 12/14/2001  . Years since quitting: 17.9  Smokeless Tobacco Never Used  Tobacco Comment   quit 12/14/2001    Goals Met:  Independence with exercise equipment Exercise tolerated well No report of cardiac concerns or symptoms  Goals Unmet:  Not Applicable  Comments: Pt able to follow exercise prescription today without complaint.  Will continue to monitor for progression.    Dr. Emily Filbert is Medical Director for [place] and [facility].

## 2019-12-08 ENCOUNTER — Ambulatory Visit: Payer: Medicare PPO

## 2019-12-08 ENCOUNTER — Other Ambulatory Visit: Payer: Self-pay

## 2019-12-08 ENCOUNTER — Encounter: Payer: Medicare PPO | Admitting: *Deleted

## 2019-12-08 DIAGNOSIS — I5032 Chronic diastolic (congestive) heart failure: Secondary | ICD-10-CM

## 2019-12-08 DIAGNOSIS — I5030 Unspecified diastolic (congestive) heart failure: Secondary | ICD-10-CM | POA: Diagnosis not present

## 2019-12-08 NOTE — Progress Notes (Signed)
Daily Session Note  Patient Details  Name: Ernest Mclean MRN: [id number] Date of Birth: 1941/04/10 Referring Provider:     Pulmonary Rehab from 10/29/2019 in [facility] Cardiac and Pulmonary Rehab  Referring Provider Lujean Amel MD      Encounter Date: 12/08/2019  Check In:  Session Check In - 12/08/19 0957      Check-In   Supervising physician immediately available to respond to emergencies See telemetry face sheet for immediately available ER MD    Location [facility]-Cardiac & Pulmonary Rehab    Staff Present Nyoka Cowden, RN, BSN, MA;Joseph [address] [place], BS, ACSM CEP, Exercise Physiologist    Virtual Visit No    Medication changes reported     No    Fall or balance concerns reported    No    Tobacco Cessation No Change    Warm-up and Cool-down Performed on first and last piece of equipment    Resistance Training Performed Yes    VAD Patient? No    PAD/SET Patient? No              Social History   Tobacco Use  Smoking Status Former Smoker  . Packs/day: 1.00  . Years: 30.00  . Pack years: 30.00  . Types: Cigarettes  . Quit date: 12/14/2001  . Years since quitting: 17.9  Smokeless Tobacco Never Used  Tobacco Comment   quit 12/14/2001    Goals Met:  Independence with exercise equipment Exercise tolerated well No report of cardiac concerns or symptoms Strength training completed today  Goals Unmet:  Not Applicable  Comments: Pt able to follow exercise prescription today without complaint.  Will continue to monitor for progression.   Dr. Emily Filbert is Medical Director for [place] and [facility].

## 2019-12-10 ENCOUNTER — Ambulatory Visit: Payer: Medicare PPO

## 2019-12-10 ENCOUNTER — Encounter: Payer: Medicare PPO | Admitting: *Deleted

## 2019-12-10 ENCOUNTER — Other Ambulatory Visit: Payer: Self-pay

## 2019-12-10 DIAGNOSIS — I5032 Chronic diastolic (congestive) heart failure: Secondary | ICD-10-CM

## 2019-12-10 DIAGNOSIS — I5030 Unspecified diastolic (congestive) heart failure: Secondary | ICD-10-CM | POA: Diagnosis not present

## 2019-12-10 DIAGNOSIS — J449 Chronic obstructive pulmonary disease, unspecified: Secondary | ICD-10-CM

## 2019-12-10 NOTE — Progress Notes (Signed)
Daily Session Note  Patient Details  Name: Ernest Mclean MRN: [id number] Date of Birth: [birth year] Referring Provider:     Pulmonary Rehab from 10/29/2019 in [facility] Cardiac and Pulmonary Rehab  Referring Provider Lujean Amel MD      Encounter Date: 12/10/2019  Check In:  Session Check In - 12/10/19 1001      Check-In   Supervising physician immediately available to respond to emergencies See telemetry face sheet for immediately available ER MD    Location [facility]-Cardiac & Pulmonary Rehab    Staff Present Renita Papa, RN Vickki Hearing, BA, ACSM CEP, Exercise Physiologist;Kara Eliezer Bottom, MS Exercise Physiologist    Virtual Visit No    Medication changes reported     No    Fall or balance concerns reported    No    Warm-up and Cool-down Performed on first and last piece of equipment    Resistance Training Performed Yes    VAD Patient? No    PAD/SET Patient? No      Pain Assessment   Currently in Pain? No/denies              Social History   Tobacco Use  Smoking Status Former Smoker  . Packs/day: 1.00  . Years: 30.00  . Pack years: 30.00  . Types: Cigarettes  . Quit date: 12/14/2001  . Years since quitting: 18.0  Smokeless Tobacco Never Used  Tobacco Comment   quit 12/14/2001    Goals Met:  Independence with exercise equipment Exercise tolerated well No report of cardiac concerns or symptoms Strength training completed today  Goals Unmet:  Not Applicable  Comments: Pt able to follow exercise prescription today without complaint.  Will continue to monitor for progression.    Dr. Emily Filbert is Medical Director for [place] and [facility].

## 2019-12-12 ENCOUNTER — Other Ambulatory Visit: Payer: Self-pay

## 2019-12-12 ENCOUNTER — Encounter: Payer: Medicare PPO | Admitting: *Deleted

## 2019-12-12 ENCOUNTER — Ambulatory Visit: Payer: Medicare PPO

## 2019-12-12 DIAGNOSIS — I5032 Chronic diastolic (congestive) heart failure: Secondary | ICD-10-CM

## 2019-12-12 DIAGNOSIS — I5030 Unspecified diastolic (congestive) heart failure: Secondary | ICD-10-CM | POA: Diagnosis not present

## 2019-12-12 NOTE — Progress Notes (Signed)
Daily Session Note  Patient Details  Name: Ernest Mclean MRN: [id number] Date of Birth: [birth year] Referring Provider:     Pulmonary Rehab from 10/29/2019 in [facility] Cardiac and Pulmonary Rehab  Referring Provider Lujean Amel MD      Encounter Date: 12/12/2019  Check In:  Session Check In - 12/12/19 0955      Check-In   Supervising physician immediately available to respond to emergencies See telemetry face sheet for immediately available ER MD    Location [facility]-Cardiac & Pulmonary Rehab    Staff Present Renita Papa, RN BSN;Jessica Luan Pulling, MA, RCEP, CCRP, CCET;Melissa [place] RDN, LDN    Virtual Visit No    Medication changes reported     No    Fall or balance concerns reported    No    Warm-up and Cool-down Performed on first and last piece of equipment    Resistance Training Performed Yes    VAD Patient? No    PAD/SET Patient? No      Pain Assessment   Currently in Pain? No/denies              Social History   Tobacco Use  Smoking Status Former Smoker  . Packs/day: 1.00  . Years: 30.00  . Pack years: 30.00  . Types: Cigarettes  . Quit date: 12/14/2001  . Years since quitting: 18.0  Smokeless Tobacco Never Used  Tobacco Comment   quit 12/14/2001    Goals Met:  Independence with exercise equipment Exercise tolerated well No report of cardiac concerns or symptoms Strength training completed today  Goals Unmet:  Not Applicable  Comments: Pt able to follow exercise prescription today without complaint.  Will continue to monitor for progression.    Dr. Emily Filbert is Medical Director for [place] and [facility].

## 2019-12-15 ENCOUNTER — Other Ambulatory Visit: Payer: Self-pay

## 2019-12-15 ENCOUNTER — Encounter: Payer: Medicare PPO | Admitting: *Deleted

## 2019-12-15 ENCOUNTER — Ambulatory Visit: Payer: Medicare PPO

## 2019-12-15 DIAGNOSIS — I5030 Unspecified diastolic (congestive) heart failure: Secondary | ICD-10-CM | POA: Diagnosis not present

## 2019-12-15 DIAGNOSIS — I5032 Chronic diastolic (congestive) heart failure: Secondary | ICD-10-CM

## 2019-12-15 NOTE — Progress Notes (Signed)
Daily Session Note  Patient Details  Name: Ernest Mclean MRN: [id number] Date of Birth: 1941/04/24 Referring Provider:     Pulmonary Rehab from 10/29/2019 in [facility] Cardiac and Pulmonary Rehab  Referring Provider Lujean Amel MD      Encounter Date: 12/15/2019  Check In:  Session Check In - 12/15/19 1025      Check-In   Supervising physician immediately available to respond to emergencies See telemetry face sheet for immediately available ER MD    Location [facility]-Cardiac & Pulmonary Rehab    Staff Present Renita Papa, RN BSN;Joseph Hood RCP,RRT,BSRT;Amanda Oletta Darter, [place], ACSM CEP, Exercise Physiologist    Virtual Visit No    Medication changes reported     No    Fall or balance concerns reported    No    Warm-up and Cool-down Performed on first and last piece of equipment    Resistance Training Performed Yes    VAD Patient? No    PAD/SET Patient? No      Pain Assessment   Currently in Pain? No/denies              Social History   Tobacco Use  Smoking Status Former Smoker  . Packs/day: 1.00  . Years: 30.00  . Pack years: 30.00  . Types: Cigarettes  . Quit date: 12/14/2001  . Years since quitting: 18.0  Smokeless Tobacco Never Used  Tobacco Comment   quit 12/14/2001    Goals Met:  Independence with exercise equipment Exercise tolerated well No report of cardiac concerns or symptoms Strength training completed today  Goals Unmet:  Not Applicable  Comments: Pt able to follow exercise prescription today without complaint.  Will continue to monitor for progression.    Dr. Emily Filbert is Medical Director for [place] and [facility].

## 2019-12-17 ENCOUNTER — Ambulatory Visit: Payer: Medicare PPO

## 2019-12-19 ENCOUNTER — Other Ambulatory Visit: Payer: Self-pay

## 2019-12-19 ENCOUNTER — Ambulatory Visit: Payer: Medicare PPO

## 2019-12-19 ENCOUNTER — Encounter: Payer: Medicare PPO | Admitting: *Deleted

## 2019-12-19 DIAGNOSIS — I5030 Unspecified diastolic (congestive) heart failure: Secondary | ICD-10-CM | POA: Diagnosis not present

## 2019-12-19 DIAGNOSIS — J449 Chronic obstructive pulmonary disease, unspecified: Secondary | ICD-10-CM

## 2019-12-19 DIAGNOSIS — I5032 Chronic diastolic (congestive) heart failure: Secondary | ICD-10-CM

## 2019-12-19 NOTE — Progress Notes (Signed)
Daily Session Note  Patient Details  Name: Ernest Mclean MRN: [id number] Date of Birth: 11/10/1940 Referring Provider:     Pulmonary Rehab from 10/29/2019 in [facility] Cardiac and Pulmonary Rehab  Referring Provider Callwood, Dwayne MD      Encounter Date: 12/19/2019  Check In:  Session Check In - 12/19/19 0949      Check-In   Supervising physician immediately available to respond to emergencies See telemetry face sheet for immediately available ER MD    Location [facility] & Pulmonary Rehab    Staff Present Meredith Craven, RN BSN;Joseph Hood RCP,RRT,BSRT;Jessica Hawkins, MA, RCEP, CCRP, CCET    Virtual Visit No    Medication changes reported     No    Fall or balance concerns reported    No    Warm-up and Cool-down Performed on first and last piece of equipment    Resistance Training Performed Yes    VAD Patient? No    PAD/SET Patient? No      Pain Assessment   Currently in Pain? No/denies              Social History   Tobacco Use  Smoking Status Former Smoker  . Packs/day: 1.00  . Years: 30.00  . Pack years: 30.00  . Types: Cigarettes  . Quit date: 12/14/2001  . Years since quitting: 18.0  Smokeless Tobacco Never Used  Tobacco Comment   quit 12/14/2001    Goals Met:  Independence with exercise equipment Exercise tolerated well No report of cardiac concerns or symptoms Strength training completed today  Goals Unmet:  Not Applicable  Comments: Pt able to follow exercise prescription today without complaint.  Will continue to monitor for progression.    Dr. Mark Miller is Medical Director for [facility] and [facility]. 

## 2019-12-22 ENCOUNTER — Other Ambulatory Visit: Payer: Self-pay

## 2019-12-22 ENCOUNTER — Ambulatory Visit: Payer: Medicare PPO

## 2019-12-22 ENCOUNTER — Encounter: Payer: Medicare PPO | Attending: Internal Medicine | Admitting: *Deleted

## 2019-12-22 DIAGNOSIS — I5032 Chronic diastolic (congestive) heart failure: Secondary | ICD-10-CM

## 2019-12-22 DIAGNOSIS — Z87891 Personal history of nicotine dependence: Secondary | ICD-10-CM | POA: Diagnosis not present

## 2019-12-22 DIAGNOSIS — Z79899 Other long term (current) drug therapy: Secondary | ICD-10-CM | POA: Insufficient documentation

## 2019-12-22 DIAGNOSIS — I5030 Unspecified diastolic (congestive) heart failure: Secondary | ICD-10-CM | POA: Diagnosis present

## 2019-12-22 NOTE — Progress Notes (Signed)
Daily Session Note  Patient Details  Name: Ernest Mclean MRN: [id number] Date of Birth: 05/20/1941 Referring Provider:     Pulmonary Rehab from 10/29/2019 in [facility] Cardiac and Pulmonary Rehab  Referring Provider Callwood, Dwayne MD      Encounter Date: 12/22/2019  Check In:  Session Check In - 12/22/19 1021      Check-In   Supervising physician immediately available to respond to emergencies See telemetry face sheet for immediately available ER MD    Location [facility]-Cardiac & Pulmonary Rehab    Staff Present Meredith Craven, RN BSN;Joseph Hood RCP,RRT,BSRT;Kelly Hayes, BS, ACSM CEP, Exercise Physiologist    Virtual Visit No    Medication changes reported     No    Fall or balance concerns reported    No    Warm-up and Cool-down Performed on first and last piece of equipment    Resistance Training Performed Yes    VAD Patient? No    PAD/SET Patient? No      Pain Assessment   Currently in Pain? No/denies              Social History   Tobacco Use  Smoking Status Former Smoker  . Packs/day: 1.00  . Years: 30.00  . Pack years: 30.00  . Types: Cigarettes  . Quit date: 12/14/2001  . Years since quitting: 18.0  Smokeless Tobacco Never Used  Tobacco Comment   quit 12/14/2001    Goals Met:  Independence with exercise equipment Exercise tolerated well No report of cardiac concerns or symptoms Strength training completed today  Goals Unmet:  Not Applicable  Comments: Pt able to follow exercise prescription today without complaint.  Will continue to monitor for progression.    Dr. Mark Miller is Medical Director for [facility] and [facility]. 

## 2019-12-24 ENCOUNTER — Other Ambulatory Visit: Payer: Self-pay

## 2019-12-24 ENCOUNTER — Ambulatory Visit: Payer: Medicare PPO

## 2019-12-24 ENCOUNTER — Encounter: Payer: Medicare PPO | Admitting: *Deleted

## 2019-12-24 DIAGNOSIS — I5032 Chronic diastolic (congestive) heart failure: Secondary | ICD-10-CM

## 2019-12-24 DIAGNOSIS — I5030 Unspecified diastolic (congestive) heart failure: Secondary | ICD-10-CM | POA: Diagnosis not present

## 2019-12-24 NOTE — Progress Notes (Signed)
Daily Session Note  Patient Details  Name: BAXTER GONZALEZ MRN: [id number] Date of Birth: 1940/10/03 Referring Provider:     Pulmonary Rehab from 10/29/2019 in [facility] Cardiac and Pulmonary Rehab  Referring Provider Lujean Amel MD      Encounter Date: 12/24/2019  Check In:  Session Check In - 12/24/19 1041      Check-In   Supervising physician immediately available to respond to emergencies See telemetry face sheet for immediately available ER MD    Location [facility]-Cardiac & Pulmonary Rehab    Staff Present Heath Lark, RN, BSN, CCRP;Meredith Sherryll Burger, RN BSN;Joseph Foy [place], [place], ACSM CEP, Exercise Physiologist    Virtual Visit No    Medication changes reported     No    Fall or balance concerns reported    No    Warm-up and Cool-down Performed on first and last piece of equipment    Resistance Training Performed Yes    VAD Patient? No    PAD/SET Patient? No      Pain Assessment   Currently in Pain? No/denies              Social History   Tobacco Use  Smoking Status Former Smoker  . Packs/day: 1.00  . Years: 30.00  . Pack years: 30.00  . Types: Cigarettes  . Quit date: 12/14/2001  . Years since quitting: 18.0  Smokeless Tobacco Never Used  Tobacco Comment   quit 12/14/2001    Goals Met:  Independence with exercise equipment Exercise tolerated well No report of cardiac concerns or symptoms  Goals Unmet:  Not Applicable  Comments: Pt able to follow exercise prescription today without complaint.  Will continue to monitor for progression.    Dr. Emily Filbert is Medical Director for [place] and [facility].

## 2019-12-26 ENCOUNTER — Encounter: Payer: Medicare PPO | Admitting: *Deleted

## 2019-12-26 ENCOUNTER — Ambulatory Visit: Payer: Medicare PPO

## 2019-12-26 ENCOUNTER — Other Ambulatory Visit: Payer: Self-pay

## 2019-12-26 DIAGNOSIS — I5032 Chronic diastolic (congestive) heart failure: Secondary | ICD-10-CM

## 2019-12-26 DIAGNOSIS — I5030 Unspecified diastolic (congestive) heart failure: Secondary | ICD-10-CM | POA: Diagnosis not present

## 2019-12-26 NOTE — Progress Notes (Signed)
Daily Session Note  Patient Details  Name: Ernest Mclean MRN: [id number] Date of Birth: Jun 28, 1940 Referring Provider:     Pulmonary Rehab from 10/29/2019 in [facility] Cardiac and Pulmonary Rehab  Referring Provider Lujean Amel MD      Encounter Date: 12/26/2019  Check In:  Session Check In - 12/26/19 0953      Check-In   Supervising physician immediately available to respond to emergencies See telemetry face sheet for immediately available ER MD    Location [facility]-Cardiac & Pulmonary Rehab    Staff Present Renita Papa, RN BSN;Joseph Lou [place], [place] Exercise Physiologist    Virtual Visit No    Medication changes reported     No    Fall or balance concerns reported    No    Warm-up and Cool-down Performed on first and last piece of equipment    Resistance Training Performed Yes    VAD Patient? No    PAD/SET Patient? No      Pain Assessment   Currently in Pain? No/denies              Social History   Tobacco Use  Smoking Status Former Smoker  . Packs/day: 1.00  . Years: 30.00  . Pack years: 30.00  . Types: Cigarettes  . Quit date: 12/14/2001  . Years since quitting: 18.0  Smokeless Tobacco Never Used  Tobacco Comment   quit 12/14/2001    Goals Met:  Independence with exercise equipment Exercise tolerated well No report of cardiac concerns or symptoms Strength training completed today  Goals Unmet:  Not Applicable  Comments: Pt able to follow exercise prescription today without complaint.  Will continue to monitor for progression.    Dr. Emily Filbert is Medical Director for [place] and [facility].

## 2019-12-29 ENCOUNTER — Encounter: Payer: Medicare PPO | Admitting: *Deleted

## 2019-12-29 ENCOUNTER — Other Ambulatory Visit: Payer: Self-pay

## 2019-12-29 ENCOUNTER — Ambulatory Visit: Payer: Medicare PPO

## 2019-12-29 DIAGNOSIS — I5032 Chronic diastolic (congestive) heart failure: Secondary | ICD-10-CM

## 2019-12-29 DIAGNOSIS — I5030 Unspecified diastolic (congestive) heart failure: Secondary | ICD-10-CM | POA: Diagnosis not present

## 2019-12-29 NOTE — Progress Notes (Signed)
Daily Session Note  Patient Details  Name: Ernest Mclean MRN: [id number] Date of Birth: 1940-05-25 Referring Provider:     Pulmonary Rehab from 10/29/2019 in [facility] Cardiac and Pulmonary Rehab  Referring Provider Lujean Amel MD      Encounter Date: 12/29/2019  Check In:  Session Check In - 12/29/19 1033      Check-In   Supervising physician immediately available to respond to emergencies See telemetry face sheet for immediately available ER MD    Location [facility]-Cardiac & Pulmonary Rehab    Staff Present Renita Papa, RN BSN;Joseph [address] [place], [place], ACSM CEP, Exercise Physiologist    Virtual Visit No    Medication changes reported     No    Fall or balance concerns reported    No    Warm-up and Cool-down Performed on first and last piece of equipment    Resistance Training Performed Yes    VAD Patient? No    PAD/SET Patient? No      Pain Assessment   Currently in Pain? No/denies              Social History   Tobacco Use  Smoking Status Former Smoker  . Packs/day: 1.00  . Years: 30.00  . Pack years: 30.00  . Types: Cigarettes  . Quit date: 12/14/2001  . Years since quitting: 18.0  Smokeless Tobacco Never Used  Tobacco Comment   quit 12/14/2001    Goals Met:  Independence with exercise equipment Exercise tolerated well No report of cardiac concerns or symptoms Strength training completed today  Goals Unmet:  Not Applicable  Comments: Pt able to follow exercise prescription today without complaint.  Will continue to monitor for progression.    Dr. Emily Filbert is Medical Director for [place] and [facility].

## 2019-12-31 ENCOUNTER — Encounter: Payer: Self-pay | Admitting: *Deleted

## 2019-12-31 ENCOUNTER — Encounter: Payer: Medicare PPO | Admitting: *Deleted

## 2019-12-31 ENCOUNTER — Other Ambulatory Visit: Payer: Self-pay

## 2019-12-31 ENCOUNTER — Ambulatory Visit: Payer: Medicare PPO

## 2019-12-31 DIAGNOSIS — I5032 Chronic diastolic (congestive) heart failure: Secondary | ICD-10-CM

## 2019-12-31 DIAGNOSIS — J449 Chronic obstructive pulmonary disease, unspecified: Secondary | ICD-10-CM

## 2019-12-31 DIAGNOSIS — I5030 Unspecified diastolic (congestive) heart failure: Secondary | ICD-10-CM | POA: Diagnosis not present

## 2019-12-31 NOTE — Progress Notes (Signed)
Daily Session Note  Patient Details  Name: Ernest Mclean MRN: [id number] Date of Birth: 1940/07/24 Referring Provider:     Pulmonary Rehab from 10/29/2019 in [facility] Cardiac and Pulmonary Rehab  Referring Provider Lujean Amel MD      Encounter Date: 12/31/2019  Check In:  Session Check In - 12/31/19 0949      Check-In   Supervising physician immediately available to respond to emergencies See telemetry face sheet for immediately available ER MD    Location [facility]-Cardiac & Pulmonary Rehab    Staff Present Renita Papa, RN BSN;Joseph [address] [place], [place], [place], CCRP, [place], [place], ACSM CEP, Exercise Physiologist    Virtual Visit No    Medication changes reported     No    Fall or balance concerns reported    No    Warm-up and Cool-down Performed on first and last piece of equipment    Resistance Training Performed Yes    VAD Patient? No    PAD/SET Patient? No      Pain Assessment   Currently in Pain? No/denies              Social History   Tobacco Use  Smoking Status Former Smoker  . Packs/day: 1.00  . Years: 30.00  . Pack years: 30.00  . Types: Cigarettes  . Quit date: 12/14/2001  . Years since quitting: 18.0  Smokeless Tobacco Never Used  Tobacco Comment   quit 12/14/2001    Goals Met:  Independence with exercise equipment Exercise tolerated well No report of cardiac concerns or symptoms Strength training completed today  Goals Unmet:  Not Applicable  Comments: Pt able to follow exercise prescription today without complaint.  Will continue to monitor for progression.    Dr. Emily Filbert is Medical Director for [place] and [facility].

## 2019-12-31 NOTE — Progress Notes (Signed)
Pulmonary Individual Treatment Plan  Patient Details  Name: Ernest Mclean MRN: [id number] Date of Birth: 1940/07/09 Referring Provider:     Pulmonary Rehab from 10/29/2019 in [facility] Cardiac and Pulmonary Rehab  Referring Provider Lujean Amel MD      Initial Encounter Date:    Pulmonary Rehab from 10/29/2019 in [facility] Cardiac and Pulmonary Rehab  Date 10/29/19      Visit Diagnosis: Heart failure, diastolic, chronic ([place])  Patient's Home Medications on Admission:  Current Outpatient Medications:  .  albuterol (VENTOLIN HFA) 108 (90 Base) MCG/ACT inhaler, Inhale 2 puffs into the lungs every 4 (four) hours as needed for wheezing or shortness of breath., Disp: 18 g, Rfl: 3 .  amLODipine (NORVASC) 5 MG tablet, Take 5 mg by mouth daily. , Disp: , Rfl:  .  apixaban (ELIQUIS) 5 MG TABS tablet, Take 2.5 mg by mouth 2 (two) times daily. , Disp: , Rfl:  .  aspirin EC 81 MG tablet, Take 81 mg by mouth daily., Disp: , Rfl:  .  atorvastatin (LIPITOR) 20 MG tablet, Take by mouth., Disp: , Rfl:  .  BD INSULIN SYRINGE U/F 31G X 5/16" 0.5 ML MISC, , Disp: , Rfl:  .  citalopram (CELEXA) 20 MG tablet, Take 20 mg by mouth at bedtime. , Disp: , Rfl: 5 .  CRANBERRY PO, Take 820 mg by mouth daily., Disp: , Rfl:  .  cyanocobalamin 500 MCG tablet, Take 500 mcg by mouth daily., Disp: , Rfl:  .  diazepam (VALIUM) 5 MG tablet, Take 5 mg by mouth every 12 (twelve) hours as needed for anxiety. , Disp: , Rfl:  .  ergocalciferol (VITAMIN D2) 50000 units capsule, Take 50,000 Units by mouth every 30 (thirty) days., Disp: , Rfl:  .  furosemide (LASIX) 20 MG tablet, Take 20 mg by mouth daily. , Disp: , Rfl:  .  HUMALOG 100 UNIT/ML injection, Inject 10-25 Units into the skin 3 (three) times daily with meals. (sliding scale as needed to a maximum of 150u daily), Disp: , Rfl:  .  insulin glargine (LANTUS) 100 UNIT/ML injection, Inject 60 Units into the skin at bedtime. , Disp: , Rfl:  .  lisinopril (PRINIVIL,ZESTRIL) 5 MG  tablet, Take 5 mg by mouth daily., Disp: , Rfl:  .  metFORMIN (GLUCOPHAGE-XR) 500 MG 24 hr tablet, Take 1,000 mg by mouth daily with supper., Disp: , Rfl:  .  metoprolol tartrate (LOPRESSOR) 100 MG tablet, Take 0.5 tablets (50 mg total) by mouth 2 (two) times daily. This is a decrease from your previous 100 mg twice a day., Disp: , Rfl: 0 .  OMEGA-3 FATTY ACIDS-VITAMIN E PO, Take 1 capsule by mouth daily., Disp: , Rfl:  .  omeprazole (PRILOSEC) 20 MG capsule, Take 20 mg by mouth daily., Disp: , Rfl:  .  pravastatin (PRAVACHOL) 20 MG tablet, Take 20 mg by mouth every evening., Disp: , Rfl:  .  pregabalin (LYRICA) 25 MG capsule, Take 25 mg by mouth 2 (two) times daily., Disp: , Rfl:  .  tiotropium (SPIRIVA) 18 MCG inhalation capsule, Place 18 mcg into inhaler and inhale daily., Disp: , Rfl:  .  vitamin C (ASCORBIC ACID) 500 MG tablet, Take 500 mg by mouth daily., Disp: , Rfl:  .  WIXELA INHUB 500-50 MCG/DOSE AEPB, USE 1 INHALATION BY MOUTH TWICE DAILY (Patient taking differently: Inhale 1 puff into the lungs 2 (two) times daily. ), Disp: 180 each, Rfl: 2  Past Medical History: Past Medical History:  Diagnosis Date  . Atrial fibrillation ([place])   . Basal cell carcinoma 06/2017   Left Ear  . CAD (coronary artery disease)   . CKD (chronic kidney disease)   . Congestive heart failure ([place])   . COPD (chronic obstructive pulmonary disease) ([place])   . Diabetes ([place])   . Hypertension   . OSA on CPAP   . 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 Packs/day: 1.00  . Years: 30.00  . Pack years: 30.00  . Types: Cigarettes  . Quit date: 12/14/2001  . Years since quitting: 18.0  Smokeless Tobacco Never Used  Tobacco Comment   quit 12/14/2001    Labs: Recent Review Flowsheet Data    Labs for ITP Cardiac and Pulmonary Rehab Latest Ref Rng & Units 06/23/2014 05/13/2015 08/01/2016 01/03/2019 09/28/2019   Cholestrol 0 - 200  mg/dL - - - - 101   LDLCALC 0 - 99 mg/dL - - - - 60   HDL >40 mg/dL - - - - 28(L)   Trlycerides <150 mg/dL - - - - 67   Hemoglobin A1c 4.8 - 5.6 % 10.8(H) 7.9(H) 8.4(H) 7.9(H) 8.4(H)   HCO3 20.0 - 28.0 mmol/L - - 32.4(H) 27.2 -   O2SAT % - - - 79.7 -       Pulmonary Assessment Scores:  Pulmonary Assessment Scores    Row Name 10/29/19 1130         ADL UCSD   ADL Phase Entry     SOB Score total 30     Rest 0     Walk 3     Stairs 2     Bath 0     Dress 0     Shop 1       CAT Score   CAT Score 7       mMRC Score   mMRC Score 3            UCSD: Self-administered rating of dyspnea associated with activities of daily living (ADLs) 6-point scale (0 = "not at all" to 5 = "maximal or unable to do because of breathlessness")  Scoring Scores range from 0 to 120.  Minimally important difference is 5 units  CAT: CAT can identify the health impairment of COPD patients and is better correlated with disease progression.  CAT has a scoring range of zero to 40. The CAT score is classified into four groups of low (less than 10), medium (10 - 20), high (21-30) and very high (31-40) based on the impact level of disease on health status. A CAT score over 10 suggests significant symptoms.  A worsening CAT score could be explained by an exacerbation, poor medication adherence, poor inhaler technique, or progression of COPD or comorbid conditions.  CAT MCID is 2 points  mMRC: mMRC (Modified Medical Research Council) Dyspnea Scale is used to assess the degree of baseline functional disability in patients of respiratory disease due to dyspnea. No minimal important difference is established. A decrease in score of 1 point or greater is considered a positive change.   Pulmonary Function Assessment:  Pulmonary Function Assessment - 10/16/19 0938      Breath   Shortness of Breath Yes;Limiting activity           Exercise Target Goals: Exercise Program Goal: Individual exercise  prescription set using results from initial 6 min walk test and THRR while  considering  patient's activity barriers and safety.   Exercise Prescription Goal: Initial exercise prescription builds to 30-45 minutes a day of aerobic activity, 2-3 days per week.  Home exercise guidelines will be given to patient during program as part of exercise prescription that the participant will acknowledge.  Education: Aerobic Exercise & Resistance Training: - Gives group verbal and written instruction on the various components of exercise. Focuses on aerobic and resistive training programs and the benefits of this training and how to safely progress through these programs..   Pulmonary Rehab from 07/10/2018 in [facility] Cardiac and Pulmonary Rehab  Date 06/14/18  Educator [facility]  Instruction Review Code 1- Verbalizes Understanding      Education: Exercise & Equipment Safety: - Individual verbal instruction and demonstration of equipment use and safety with use of the equipment.   Pulmonary Rehab from 12/24/2019 in [facility] Cardiac and Pulmonary Rehab  Date 10/16/19  Educator [facility]  Instruction Review Code 1- Verbalizes Understanding      Education: Exercise Physiology & General Exercise Guidelines: - Group verbal and written instruction with models to review the exercise physiology of the cardiovascular system and associated critical values. Provides general exercise guidelines with specific guidelines to those with heart or lung disease.    Pulmonary Rehab from 12/24/2019 in [facility] Cardiac and Pulmonary Rehab  Date 12/03/19  Educator [facility]  Instruction Review Code 1- Verbalizes Understanding      Education: Flexibility, Balance, Mind/Body Relaxation: Provides group verbal/written instruction on the benefits of flexibility and balance training, including mind/body exercise modes such as yoga, pilates and tai chi.  Demonstration and skill practice provided.   Pulmonary Rehab from 07/10/2018 in [facility] Cardiac and Pulmonary  Rehab  Date 06/19/18  Educator [facility]  Instruction Review Code 1- Verbalizes Understanding      Activity Barriers & Risk Stratification:  Activity Barriers & Cardiac Risk Stratification - 10/29/19 1121      Activity Barriers & Cardiac Risk Stratification   Activity Barriers Arthritis;Shortness of Breath;Balance Concerns;Deconditioning;Muscular Weakness           6 Minute Walk:  6 Minute Walk    Row Name 10/29/19 1103         6 Minute Walk   Phase Initial     Distance 815 feet     Walk Time 5.88 minutes     # of Rest Breaks 1  7 sec     MPH 1.57     METS 1.5     RPE 15     Perceived Dyspnea  3     VO2 Peak 5.23     Symptoms Yes (comment)     Comments calf pain 8/10     Resting HR 56 bpm     Resting BP 108/64     Resting Oxygen Saturation  96 %     Exercise Oxygen Saturation  during 6 min walk 88 %     Max Ex. HR 88 bpm     Max Ex. BP 134/56     2 Minute Post BP 128/70       Interval HR   1 Minute HR 64     2 Minute HR 66     3 Minute HR 72     4 Minute HR 81     5 Minute HR 72     6 Minute HR 88     2 Minute Post HR 67     Interval Heart Rate? Yes  Interval Oxygen   Interval Oxygen? Yes     Baseline Oxygen Saturation % 96 %     1 Minute Oxygen Saturation % 89 %     1 Minute Liters of Oxygen 2 L  continuous     2 Minute Oxygen Saturation % 88 %     2 Minute Liters of Oxygen 2 L     3 Minute Oxygen Saturation % 88 %     3 Minute Liters of Oxygen 2 L     4 Minute Oxygen Saturation % 89 %     4 Minute Liters of Oxygen 2 L     5 Minute Oxygen Saturation % 90 %     5 Minute Liters of Oxygen 0 L     6 Minute Oxygen Saturation % 89 %     6 Minute Liters of Oxygen 0 L     2 Minute Post Oxygen Saturation % 96 %     2 Minute Post Liters of Oxygen 2 L           Oxygen Initial Assessment:  Oxygen Initial Assessment - 10/16/19 0937      Home Oxygen   Home Oxygen Device Home Concentrator;Portable Concentrator;E-Tanks    Sleep Oxygen Prescription  Continuous;CPAP    Liters per minute 2    Home Exercise Oxygen Prescription Continuous    Liters per minute 2    Home at Rest Exercise Oxygen Prescription None    Compliance with Home Oxygen Use Yes      Initial 6 min Walk   Oxygen Used Continuous    Liters per minute 2      Program Oxygen Prescription   Program Oxygen Prescription Continuous    Liters per minute 2      Intervention   Short Term Goals To learn and exhibit compliance with exercise, home and travel O2 prescription;To learn and understand importance of monitoring SPO2 with pulse oximeter and demonstrate accurate use of the pulse oximeter.;To learn and understand importance of maintaining oxygen saturations>88%;To learn and demonstrate proper pursed lip breathing techniques or other breathing techniques.;To learn and demonstrate proper use of respiratory medications    Long  Term Goals Exhibits compliance with exercise, home and travel O2 prescription;Verbalizes importance of monitoring SPO2 with pulse oximeter and return demonstration;Maintenance of O2 saturations>88%;Exhibits proper breathing techniques, such as pursed lip breathing or other method taught during program session;Compliance with respiratory medication;Demonstrates proper use of MDI's           Oxygen Re-Evaluation:  Oxygen Re-Evaluation    Row Name 11/03/19 1102 11/19/19 1014 12/19/19 1024         Program Oxygen Prescription   Program Oxygen Prescription Continuous Continuous Continuous     Liters per minute _0 Home Oxygen   Home Oxygen Device Home Concentrator;Portable Concentrator;E-Tanks Home Concentrator;E-Tanks Home Concentrator;Portable Concentrator     Sleep Oxygen Prescription Continuous;CPAP Continuous;CPAP Continuous     Liters per minute _1 Home Exercise Oxygen Prescription Continuous Continuous Continuous     Liters per minute _2 Home at Rest Exercise Oxygen Prescription None None None     Liters per minute --  0 0     Compliance with Home Oxygen Use Yes Yes Yes       Goals/Expected Outcomes   Short Term Goals To learn and exhibit compliance with exercise, home and travel O2 prescription;To learn  and understand importance of monitoring SPO2 with pulse oximeter and demonstrate accurate use of the pulse oximeter.;To learn and understand importance of maintaining oxygen saturations>88%;To learn and demonstrate proper pursed lip breathing techniques or other breathing techniques.;To learn and demonstrate proper use of respiratory medications To learn and demonstrate proper use of respiratory medications;To learn and understand importance of monitoring SPO2 with pulse oximeter and demonstrate accurate use of the pulse oximeter.;To learn and exhibit compliance with exercise, home and travel O2 prescription;To learn and understand importance of maintaining oxygen saturations>88%;To learn and demonstrate proper pursed lip breathing techniques or other breathing techniques. To learn and exhibit compliance with exercise, home and travel O2 prescription;To learn and understand importance of monitoring SPO2 with pulse oximeter and demonstrate accurate use of the pulse oximeter.;To learn and understand importance of maintaining oxygen saturations>88%;To learn and demonstrate proper pursed lip breathing techniques or other breathing techniques.;To learn and demonstrate proper use of respiratory medications     Long  Term Goals Exhibits compliance with exercise, home and travel O2 prescription;Verbalizes importance of monitoring SPO2 with pulse oximeter and return demonstration;Maintenance of O2 saturations>88%;Exhibits proper breathing techniques, such as pursed lip breathing or other method taught during program session;Compliance with respiratory medication;Demonstrates proper use of MDI's Exhibits compliance with exercise, home and travel O2 prescription;Maintenance of O2 saturations>88%;Verbalizes importance of monitoring  SPO2 with pulse oximeter and return demonstration;Exhibits proper breathing techniques, such as pursed lip breathing or other method taught during program session;Demonstrates proper use of MDI's;Compliance with respiratory medication Exhibits compliance with exercise, home and travel O2 prescription;Verbalizes importance of monitoring SPO2 with pulse oximeter and return demonstration;Maintenance of O2 saturations>88%;Exhibits proper breathing techniques, such as pursed lip breathing or other method taught during program session;Compliance with respiratory medication;Demonstrates proper use of MDI's     Comments Reviewed PLB technique with pt.  Talked about how it works and it's importance in maintaining their exercise saturations. When Claudia is resting at home he does not require oxygen. He has seen Dr. [name] yesterday and he stated he does not need oxygen at rest. Fritz Pickerel does not want to wear oxygen everytime he goes out but knows that he needs to wear 2 liters on exertion. Bernerd has a new oxygen delivery machine. It can do pulsed and continuous oxygen. He is wearing his oxygen continuous 2 liters. His new concentrator is liter than a tank and has been doing well with is new machine. He feels like he is less short of breath and is able to do more.     Goals/Expected Outcomes Short: Become more profiecient at using PLB.   Long: Become independent at using PLB. Short: use oxygen on exertion at all times. Long: maintain oxygen saturations at home at rest and exertion independently Short: get familiar with his new concentrator. Long: maintain use of concentrator independently.            Oxygen Discharge (Final Oxygen Re-Evaluation):  Oxygen Re-Evaluation - 12/19/19 1024      Program Oxygen Prescription   Program Oxygen Prescription Continuous    Liters per minute 2      Home Oxygen   Home Oxygen Device Home Concentrator;Portable Concentrator    Sleep Oxygen Prescription Continuous    Liters per  minute 2    Home Exercise Oxygen Prescription Continuous    Liters per minute 2    Home at Rest Exercise Oxygen Prescription None    Liters per minute 0    Compliance with Home Oxygen Use Yes  Goals/Expected Outcomes   Short Term Goals To learn and exhibit compliance with exercise, home and travel O2 prescription;To learn and understand importance of monitoring SPO2 with pulse oximeter and demonstrate accurate use of the pulse oximeter.;To learn and understand importance of maintaining oxygen saturations>88%;To learn and demonstrate proper pursed lip breathing techniques or other breathing techniques.;To learn and demonstrate proper use of respiratory medications    Long  Term Goals Exhibits compliance with exercise, home and travel O2 prescription;Verbalizes importance of monitoring SPO2 with pulse oximeter and return demonstration;Maintenance of O2 saturations>88%;Exhibits proper breathing techniques, such as pursed lip breathing or other method taught during program session;Compliance with respiratory medication;Demonstrates proper use of MDI's    Comments Ronak has a new oxygen delivery machine. It can do pulsed and continuous oxygen. He is wearing his oxygen continuous 2 liters. His new concentrator is liter than a tank and has been doing well with is new machine. He feels like he is less short of breath and is able to do more.    Goals/Expected Outcomes Short: get familiar with his new concentrator. Long: maintain use of concentrator independently.           Initial Exercise Prescription:  Initial Exercise Prescription - 10/29/19 1100      Date of Initial Exercise RX and Referring Provider   Date 10/29/19    Referring Provider Lujean Amel MD      Oxygen   Oxygen Continuous    Liters 2      Recumbant Bike   Level 1    RPM 50    Watts 16    Minutes 15    METs 1.5      NuStep   Level 1    SPM 80    Minutes 15    METs 1.5      Arm Ergometer   Level 1    Watts  23    RPM 25    Minutes 15    METs 1.5      Biostep-RELP   Level 1    SPM 50    Minutes 15    METs 1      Prescription Details   Frequency (times per week) 3    Duration Progress to 30 minutes of continuous aerobic without signs/symptoms of physical distress      Intensity   THRR 40-80% of Max Heartrate 91-126    Ratings of Perceived Exertion 11-13    Perceived Dyspnea 0-4      Progression   Progression Continue to progress workloads to maintain intensity without signs/symptoms of physical distress.      Resistance Training   Training Prescription Yes    Weight 3 lb    Reps 10-15           Perform Capillary Blood Glucose checks as needed.  Exercise Prescription Changes:  Exercise Prescription Changes    Row Name 10/29/19 1100 11/06/19 1400 11/19/19 0900 12/03/19 1300 12/16/19 1500     Response to Exercise   Blood Pressure (Admit) 108/64 136/70 138/74 160/74 142/70   Blood Pressure (Exercise) 134/56 140/70 152/64 142/60 132/60   Blood Pressure (Exit) 124/66 132/68 122/64 158/56 130/70   Heart Rate (Admit) 56 bpm 61 bpm 64 bpm 69 bpm 53 bpm   Heart Rate (Exercise) 81 bpm 66 bpm 72 bpm 82 bpm 60 bpm   Heart Rate (Exit) 61 bpm 61 bpm 68 bpm 68 bpm 60 bpm   Oxygen Saturation (Admit) 96 % 98 % 91 %  98 % 98 %   Oxygen Saturation (Exercise) 88 % 95 % 90 % 97 % 95 %   Oxygen Saturation (Exit) 97 % 98 % 97 % 98 % 97 %   Rating of Perceived Exertion (Exercise) _0 Perceived Dyspnea (Exercise) _1 Symptoms calf pain 8/10 none none none none   Comments walk test results -- -- -- --   Duration -- Progress to 30 minutes of  aerobic without signs/symptoms of physical distress Continue with 30 min of aerobic exercise without signs/symptoms of physical distress. Continue with 30 min of aerobic exercise without signs/symptoms of physical distress. Continue with 30 min of aerobic exercise without signs/symptoms of physical distress.   Intensity -- THRR unchanged  THRR unchanged THRR unchanged THRR unchanged     Progression   Progression -- Continue to progress workloads to maintain intensity without signs/symptoms of physical distress. Continue to progress workloads to maintain intensity without signs/symptoms of physical distress. Continue to progress workloads to maintain intensity without signs/symptoms of physical distress. Continue to progress workloads to maintain intensity without signs/symptoms of physical distress.   Average METs -- 2.85 3 1.9 2.17     Resistance Training   Training Prescription -- Yes Yes Yes Yes   Weight -- 3 lb 3 lb 4 lb 4 lb   Reps -- 10-15 10-15 10-15 10-15     Interval Training   Interval Training -- -- No No No     Oxygen   Oxygen -- Continuous Continuous Continuous Continuous   Liters -- _2 Treadmill   MPH -- -- -- 1 1   Grade -- -- -- 0 0   Minutes -- -- -- 15 15   METs -- -- -- 1.77 1.77     Recumbant Bike   Level -- 1 1 -- --   RPM -- 50 -- -- --   Watts -- 27 18 -- --   Minutes -- 15 15 -- --   METs -- 2.85 -- -- --     NuStep   Level -- -- 5 -- 5   Minutes -- -- 15 -- 15   METs -- -- 3.1 -- 2.6     Arm Ergometer   Level -- -- 1 -- --   Minutes -- -- 15 -- --     Recumbant Elliptical   Level -- -- -- -- 1   Minutes -- -- -- -- 15   METs -- -- -- -- 1.5     REL-XR   Level -- -- -- 1 --   Minutes -- -- -- 15 --   METs -- -- -- 2 --     Biostep-RELP   Level -- -- 3 -- 3   Minutes -- -- 15 -- 15   METs -- -- 3 -- 3          Exercise Comments:   Exercise Goals and Review:  Exercise Goals    Row Name 10/29/19 1125             Exercise Goals   Increase Physical Activity Yes       Intervention Provide advice, education, support and counseling about physical activity/exercise needs.;Develop an individualized exercise prescription for aerobic and resistive training based on initial evaluation findings, risk stratification, comorbidities and participant's personal  goals.       Expected Outcomes Short Term: Attend rehab on a  regular basis to increase amount of physical activity.;Long Term: Add in home exercise to make exercise part of routine and to increase amount of physical activity.;Long Term: Exercising regularly at least 3-5 days a week.       Increase Strength and Stamina Yes       Intervention Provide advice, education, support and counseling about physical activity/exercise needs.;Develop an individualized exercise prescription for aerobic and resistive training based on initial evaluation findings, risk stratification, comorbidities and participant's personal goals.       Expected Outcomes Short Term: Increase workloads from initial exercise prescription for resistance, speed, and METs.;Short Term: Perform resistance training exercises routinely during rehab and add in resistance training at home;Long Term: Improve cardiorespiratory fitness, muscular endurance and strength as measured by increased METs and functional capacity (6MWT)       Able to understand and use rate of perceived exertion (RPE) scale Yes       Intervention Provide education and explanation on how to use RPE scale       Expected Outcomes Long Term:  Able to use RPE to guide intensity level when exercising independently;Short Term: Able to use RPE daily in rehab to express subjective intensity level       Able to understand and use Dyspnea scale Yes       Intervention Provide education and explanation on how to use Dyspnea scale       Expected Outcomes Short Term: Able to use Dyspnea scale daily in rehab to express subjective sense of shortness of breath during exertion;Long Term: Able to use Dyspnea scale to guide intensity level when exercising independently       Knowledge and understanding of Target Heart Rate Range (THRR) Yes       Intervention Provide education and explanation of THRR including how the numbers were predicted and where they are located for reference       Expected  Outcomes Short Term: Able to state/look up THRR;Short Term: Able to use daily as guideline for intensity in rehab;Long Term: Able to use THRR to govern intensity when exercising independently       Able to check pulse independently Yes       Intervention Provide education and demonstration on how to check pulse in carotid and radial arteries.;Review the importance of being able to check your own pulse for safety during independent exercise       Expected Outcomes Short Term: Able to explain why pulse checking is important during independent exercise;Long Term: Able to check pulse independently and accurately       Understanding of Exercise Prescription Yes       Intervention Provide education, explanation, and written materials on patient's individual exercise prescription       Expected Outcomes Short Term: Able to explain program exercise prescription;Long Term: Able to explain home exercise prescription to exercise independently       Improve claudication pain toleration; Improve walking ability Yes       Intervention Attend education sessions to aid in risk factor modification and understanding of disease process       Expected Outcomes Short Term: Improve walking distance/time to onset of claudication pain;Long Term: Improve walking ability and toleration to claudication              Exercise Goals Re-Evaluation :  Exercise Goals Re-Evaluation    Row Name 11/03/19 1101 11/06/19 1408 11/19/19 0957 11/19/19 1011 12/03/19 1316     Exercise Goal Re-Evaluation   Exercise Goals Review  Increase Physical Activity;Increase Strength and Stamina;Able to understand and use Dyspnea scale;Able to check pulse independently;Understanding of Exercise Prescription;Knowledge and understanding of Target Heart Rate Range (THRR);Able to understand and use rate of perceived exertion (RPE) scale -- Increase Physical Activity;Increase Strength and Stamina;Understanding of Exercise Prescription Increase Physical  Activity;Increase Strength and Stamina Increase Physical Activity;Increase Strength and Stamina   Comments Reviewed RPE and dyspnea scales, THR and program prescription with pt today.  Pt voiced understanding and was given a copy of goals to take home. Bradden is tolerating exercise well in his first week.  Staff will monitor progress. Rosemary is doing well in rehab.  He tried to go without oxygen on Monday, but needed it once into full exertion.  He is up to level 5 on the NuStep.  We will continue to monitor his progress. Lorne is doing weight at home but is still going through reovery from his pneumonia. He walks at home in his house at the moment. He is working hard in rehab and is willing to push himself while maintaining his shortness of breath. Ibraham has tolerated exercise well.  He has increased weights for strength work to 4 lb.   Expected Outcomes Short: Use RPE daily to regulate intensity. Long: Follow program prescription in THR. Short: attend consistently Long: build overall stamina Short: Increase BioStep  Long; Continue to rebuild stamina Short: continue to exercise more frequently. Long: maintain a more vigarous exercise routine independently at home. Short: attend consistently Long:  build overall stamina   Row Name 12/16/19 1521             Exercise Goal Re-Evaluation   Exercise Goals Review Increase Physical Activity;Increase Strength and Stamina;Understanding of Exercise Prescription       Comments Trayton has been doing well in rehab.  He is up to level 5 on the NuStep.  We will continue to montior his progress.       Expected Outcomes Short: Continue to bump up workloads Long: Continue to builld stamina.              Discharge Exercise Prescription (Final Exercise Prescription Changes):  Exercise Prescription Changes - 12/16/19 1500      Response to Exercise   Blood Pressure (Admit) 142/70    Blood Pressure (Exercise) 132/60    Blood Pressure (Exit) 130/70    Heart Rate  (Admit) 53 bpm    Heart Rate (Exercise) 60 bpm    Heart Rate (Exit) 60 bpm    Oxygen Saturation (Admit) 98 %    Oxygen Saturation (Exercise) 95 %    Oxygen Saturation (Exit) 97 %    Rating of Perceived Exertion (Exercise) 15    Perceived Dyspnea (Exercise) 2    Symptoms none    Duration Continue with 30 min of aerobic exercise without signs/symptoms of physical distress.    Intensity THRR unchanged      Progression   Progression Continue to progress workloads to maintain intensity without signs/symptoms of physical distress.    Average METs 2.17      Resistance Training   Training Prescription Yes    Weight 4 lb    Reps 10-15      Interval Training   Interval Training No      Oxygen   Oxygen Continuous    Liters 2      Treadmill   MPH 1    Grade 0    Minutes 15    METs 1.77      NuStep  Level 5    Minutes 15    METs 2.6      Recumbant Elliptical   Level 1    Minutes 15    METs 1.5      Biostep-RELP   Level 3    Minutes 15    METs 3           Nutrition:  Target Goals: Understanding of nutrition guidelines, daily intake of sodium <1531m, cholesterol <2060m calories 30% from fat and 7% or less from saturated fats, daily to have 5 or more servings of fruits and vegetables.  Education: Controlling Sodium/Reading Food Labels -Group verbal and written material supporting the discussion of sodium use in heart healthy nutrition. Review and explanation with models, verbal and written materials for utilization of the food label.   [facility]  Date 07/10/18  [facility] Code 1- Verbalizes Understanding      Education: General Nutrition Guidelines/Fats and Fiber: -Group instruction provided by verbal, written material, models and posters to present the general guidelines for heart healthy nutrition. Gives an explanation and review of dietary fats and fiber.   [facility]  Date 07/03/18  [facility] Review Code 1- Verbalizes Understanding      Biometrics:  Pre Biometrics - 10/29/19 1126      Pre Biometrics   Height 6' 0.5" (1.842 m)    Weight 223 lb 8 oz (101.4 kg)    BMI (Calculated) 29.88    Single Leg Stand 0 seconds            Nutrition Therapy Plan and Nutrition Goals:   Nutrition Assessments:  Nutrition Assessments - 10/29/19 1127      MEDFICTS Scores   Pre Score 42           MEDIFICTS Score Key:          ?70 Need to make dietary changes          40-70 Heart Healthy Diet         ? 40 Therapeutic Level Cholesterol Diet  Nutrition Goals Re-Evaluation:  Nutrition Goals Re-Evaluation    [place] 11/19/19 1021             Goals   Current Weight 225 lb (102.1 kg)       Nutrition Goal Lose weight       Comment LaVigneshants to lose weight to help with his breathing. He states that he loves food and over eats at times.       Expected Outcome Short: cut back on meal portions. Long: lose 10 pounds by the end of Rehab              Nutrition Goals Discharge (Final Nutrition Goals Re-Evaluation):  Nutrition Goals Re-Evaluation - 11/19/19 1021      Goals   Current Weight 225 lb (102.1 kg)    Nutrition Goal Lose weight    Comment LaMarlanants to lose weight to help with his breathing. He states that he loves food and over eats at times.    Expected Outcome Short: cut back on meal portions. Long: lose 10 pounds by the end of Rehab           Psychosocial: Target Goals: Acknowledge presence or absence of significant depression and/or stress, maximize coping skills, provide positive support system. Participant is able to verbalize types and ability to use  techniques and skills needed for reducing stress and depression.   Education: Depression - Provides group verbal and written instruction on the correlation between heart/lung disease and depressed mood, treatment  options, and the stigmas associated with seeking treatment.   Pulmonary Rehab from 12/24/2019 in [facility] Cardiac and Pulmonary Rehab  Date 12/24/19  Educator [facility]  Instruction Review Code 1- [employer] Understanding      Education: Sleep Hygiene -Provides group verbal and written instruction about how sleep can affect your health.  Define sleep hygiene, discuss sleep cycles and impact of sleep habits. Review good sleep hygiene tips.    Pulmonary Re[facility]10/[facility]  Date 05/29/18  Educator [facility]  Instruction Review Code 1- Verbalizes Understanding      Education: Stress and Anxiety: - Provides group verbal and written instruction about the health risks of elevated stress and causes of high stress.  Discuss the correlation between heart/lung disease and anxiety and treatment options. Review healthy ways to manage with stress and anxiety.   Pulmonary Rehab from 07/10/2018 in [facility] Cardiac and Pulmonary Rehab  Date 06/26/18  Educator [facility]  Instruction Review Code 1- Verbalizes Understanding      Initial Review & Psychosocial Screening:  Initial Psych Review & Screening - 10/16/19 0938      Initial Review   Current issues with Current Anxiety/Panic;History of Depression      Family Dynamics   Good Support System? Yes    Comments Bentzion has some anxiety and everything that he has been through with his healthcan make him feel that way. He has got over his fear of dying and is not depressed like he used to me.      Barriers   Psychosocial barriers to participate in program The patient should benefit from training in stress management and relaxation.      Screening Interventions   Interventions Encouraged to exercise;Provide feedback about the scores to participant;To provide support and resources with identified psychosocial needs    Expected Outcomes Short Term goal: Utilizing psychosocial counselor, staff and physician to assist with identification of  specific Stressors or current issues interfering with healing process. Setting desired goal for each stressor or current issue identified.;Long Term Goal: Stressors or current issues are controlled or eliminated.;Short Term goal: Identification and review with participant of any Quality of Life or Depression concerns found by scoring the questionnaire.;Long Term goal: The participant improves quality of Life and PHQ9 Scores as seen by post scores and/or verbalization of changes           Quality of Life Scores:  Scores of 19 and below usually indicate a poorer quality of life in these areas.  A difference of  2-3 points is a clinically meaningful difference.  A difference of 2-3 points in the total score of the Quality of Life Index has been associated with significant improvement in overall quality of life, self-image, physical symptoms, and general health in studies assessing change in quality of life.  PHQ-9: Recent Review Flowsheet Data    Depression screen [facility] 2/9 10/29/2019 04/22/2018 04/01/2018 07/16/2017 03/19/2017   Decreased Interest 1 0 0 0 0   Down, Depressed, Hopeless 1 0 0 0 0   PHQ - 2 Score 2 0 0 0 0   Altered sleeping 0 0 0 - -   Tired, decreased energy 1 0 1 - -   Change in appetite 0 0 0 - -   Feeling bad or failure about yourself  1 0 0 - -   Trouble concentrating 0 0 0 - -   Moving slowly or fidgety/restless 0 0 0 - -   Suicidal thoughts 0 0 0 - -   PHQ-9 Score 4 0 1 - -   Difficult doing work/chores Not difficult at all Not difficult at all Not difficult at all - -     Interpretation of Total Score  Total Score Depression Severity:  1-4 = Minimal depression, 5-9 = Mild depression, 10-14 = Moderate depression, 15-19 = Moderately severe depression, 20-27 = Severe depression   Psychosocial Evaluation and Intervention:  Psychosocial Evaluation - 10/16/19 0940      Psychosocial Evaluation & Interventions   Interventions Encouraged to exercise with the program and  follow exercise prescription    Comments [place] has some anxiety and everything that he has been through with his healthcan make him feel that way. He has got over his fear of dying and is not depressed like he used to me.    Expected Outcomes Short: Start [facility] to help with mood. Long: Maintain a healthy mental state.    Continue Psychosocial Services  Follow up required by staff           Psychosocial Re-Evaluation:  Psychosocial Re-Evaluation    [place] Name 11/19/19 1020 12/19/19 1036           Psychosocial Re-Evaluation   Current issues with None Identified None Identified      Comments Patient reports no issues with their current mental states, sleep, stress, depression or anxiety. Will follow up with patient in a few weeks for any changes. Patient reports no issues with their current mental states, sleep, stress, depression or anxiety. Will follow up with patient in a few weeks for any changes. He is alot happier now that he is able to be out of the house and interact with people at rehab.      Expected Outcomes Short: Continue to exercise regularly to support mental health and notify staff of any changes. Long: maintain mental health and well being through teaching of rehab or prescribed medications independently. Short: Continue to exercise regularly to support mental health and notify staff of any changes. Long: maintain mental health and well being through teaching of rehab or prescribed medications independently.      Interventions Encouraged to attend Pulmonary Rehabilitation for the exercise Encouraged to attend Pulmonary Rehabilitation for the exercise      Continue Psychosocial Services  Follow up required by staff Follow up required by staff             Psychosocial Discharge (Final Psychosocial Re-Evaluation):  Psychosocial Re-Evaluation - 12/19/19 1036      Psychosocial Re-Evaluation   Current issues with None Identified    Comments Patient reports no issues with  their current mental states, sleep, stress, depression or anxiety. Will follow up with patient in a few weeks for any changes. He is alot happier now that he is able to be out of the house and interact with people at rehab.    Expected Outcomes Short: Continue to exercise regularly to support mental health and notify staff of any changes. Long: maintain mental health and well being through teaching of rehab or prescribed medications independently.    Interventions Encouraged to attend Pulmonary Rehabilitation for the exercise    Continue Psychosocial Services  Follow up required by staff           Education: Education Goals: Education classes will be provided  on a weekly basis, covering required topics. Participant will state understanding/return demonstration of topics presented.  Learning Barriers/Preferences:  Learning Barriers/Preferences - 10/16/19 0940      Learning Barriers/Preferences   Learning Barriers Sight    Learning Preferences None           General Pulmonary Education Topics:  Infection Prevention: - Provides verbal and written material to individual with discussion of infection control including proper hand washing and proper equipment cleaning during exercise session.   Pulmonary Rehab from 12/24/2019 in [facility] Cardiac and Pulmonary Rehab  Date 10/16/19  Educator [facility]  Instruction Review Code 1- Verbalizes Understanding      Falls Prevention: - Provides verbal and written material to individual with discussion of falls prevention and safety.   Pulmonary Rehab from 12/24/2019 in [place] Cardiac and Pulmonary Rehab  Date 10/16/19  Educator [facility]  Instruction Review Code 1- Verbalizes Understanding      Chronic Lung Diseases: - Group verbal and written instruction to review updates, respiratory medications, advancements in procedures and treatments. Discuss use of supplemental oxygen including available portable oxygen systems, continuous and intermittent flow rates,  concentrators, personal use and safety guidelines. Review proper use of inhaler and spacers. Provide informative websites for self-education.    Pulmonary Rehab from 12/24/2019 in [facility] Cardiac and Pulmonary Rehab  Date 12/10/19  Educator [facility]  Instruction Review Code 1- Verbalizes Understanding      Energy Conservation: - Provide group verbal and written instruction for methods to conserve energy, plan and organize activities. Instruct on pacing techniques, use of adaptive equipment and posture/positioning to relieve shortness of breath.   Triggers and Exacerbations: - Group verbal and written instruction to review types of environmental triggers and ways to prevent exacerbations. Discuss weather changes, air quality and the benefits of nasal washing. Review warning signs and symptoms to help prevent infections. Discuss techniques for effective airway clearance, coughing, and vibrations.   Pulmonary Rehab from 07/10/2018 in [facility] Cardiac and Pulmonary Rehab  Date 05/10/18  Educator [facility]  Instruction Review Code 1- Verbalizes Understanding      AED/CPR: - Group verbal and written instruction with the use of models to demonstrate the basic use of the AED with the basic ABC's of resuscitation.   Anatomy and Physiology of the Lungs: - Group verbal and written instruction with the use of models to provide basic lung anatomy and physiology related to function, structure and complications of lung disease.   Pulmonary Rehab from 07/10/2018 in [facility] Cardiac and Pulmonary Rehab  Date 04/05/18  Educator [facility]  Instruction Review Code 1- Verbalizes Understanding      Anatomy & Physiology of the Heart: - Group verbal and written instruction and models provide basic cardiac anatomy and physiology, with the coronary electrical and arterial systems. Review of Valvular disease and Heart Failure   Pulmonary Rehab from 07/10/2018 in [name] Cardiac and Pulmonary Rehab  Date 05/24/18  Educator [facility]  Instruction  Review Code 1- Verbalizes Understanding      Cardiac Medications: - Group verbal and written instruction to review commonly prescribed medications for heart disease. Reviews the medication, class of the drug, and side effects.   Pulmonary Rehab from 07/10/2018 in [facility] Cardiac and Pulmonary Rehab  Date 05/31/18  Educator [facility]  Instruction Review Code 1- Verbalizes Understanding      Other: -Provides group and verbal instruction on various topics (see comments)   Knowledge Questionnaire Score:  Knowledge Questionnaire Score - 10/29/19 1128      Knowledge Questionnaire  Score   Pre Score 17/18 Education Focus: O2 safety            Core Components/Risk Factors/Patient Goals at Admission:  Personal Goals and Risk Factors at Admission - 10/29/19 1128      Core Components/Risk Factors/Patient Goals on Admission    Weight Management Yes;Weight Loss    Intervention Weight Management: Develop a combined nutrition and exercise program designed to reach desired caloric intake, while maintaining appropriate intake of nutrient and fiber, sodium and fats, and appropriate energy expenditure required for the weight goal.;Weight Management: Provide education and appropriate resources to help participant work on and attain dietary goals.    Admit Weight 223 lb 8 oz (101.4 kg)    Goal Weight: Short Term 218 lb (98.9 kg)    Goal Weight: Long Term 213 lb (96.6 kg)    Expected Outcomes Long Term: Adherence to nutrition and physical activity/exercise program aimed toward attainment of established weight goal;Short Term: Continue to assess and modify interventions until short term weight is achieved;Weight Loss: Understanding of general recommendations for a balanced deficit meal plan, which promotes 1-2 lb weight loss per week and includes a negative energy balance of [phone] kcal/d;Understanding recommendations for meals to include 15-35% energy as protein, 25-35% energy from fat, 35-60% energy from  carbohydrates, less than 263m of dietary cholesterol, 20-35 gm of total fiber daily;Understanding of distribution of calorie intake throughout the day with the consumption of 4-5 meals/snacks    Improve shortness of breath with ADL's Yes    Intervention Provide education, individualized exercise plan and daily activity instruction to help decrease symptoms of SOB with activities of daily living.    Expected Outcomes Short Term: Improve cardiorespiratory fitness to achieve a reduction of symptoms when performing ADLs;Long Term: Be able to perform more ADLs without symptoms or delay the onset of symptoms    Diabetes Yes    Intervention Provide education about signs/symptoms and action to take for hypo/hyperglycemia.;Provide education about proper nutrition, including hydration, and aerobic/resistive exercise prescription along with prescribed medications to achieve blood glucose in normal ranges: Fasting glucose 65-99 mg/dL    Expected Outcomes Short Term: Participant verbalizes understanding of the signs/symptoms and immediate care of hyper/hypoglycemia, proper foot care and importance of medication, aerobic/resistive exercise and nutrition plan for blood glucose control.;Long Term: Attainment of HbA1C < 7%.    Heart Failure Yes    Intervention Provide a combined exercise and nutrition program that is supplemented with education, support and counseling about heart failure. Directed toward relieving symptoms such as shortness of breath, decreased exercise tolerance, and extremity edema.    Expected Outcomes Improve functional capacity of life;Short term: Attendance in program 2-3 days a week with increased exercise capacity. Reported lower sodium intake. Reported increased fruit and vegetable intake. Reports medication compliance.;Short term: Daily weights obtained and reported for increase. Utilizing diuretic protocols set by physician.;Long term: Adoption of self-care skills and reduction of barriers for  early signs and symptoms recognition and intervention leading to self-care maintenance.    Hypertension Yes    Intervention Provide education on lifestyle modifcations including regular physical activity/exercise, weight management, moderate sodium restriction and increased consumption of fresh fruit, vegetables, and low fat dairy, alcohol moderation, and smoking cessation.;Monitor prescription use compliance.    Expected Outcomes Short Term: Continued assessment and intervention until BP is < 140/946mHG in hypertensive participants. < 130/8068mG in hypertensive participants with diabetes, heart failure or chronic kidney disease.;Long Term: Maintenance of blood pressure at goal levels.  Lipids Yes    Intervention Provide education and support for participant on nutrition & aerobic/resistive exercise along with prescribed medications to achieve LDL <79m, HDL >464m    Expected Outcomes Short Term: Participant states understanding of desired cholesterol values and is compliant with medications prescribed. Participant is following exercise prescription and nutrition guidelines.;Long Term: Cholesterol controlled with medications as prescribed, with individualized exercise RX and with personalized nutrition plan. Value goals: LDL < 706mHDL > 40 mg.           Education:Diabetes - Individual verbal and written instruction to review signs/symptoms of diabetes, desired ranges of glucose level fasting, after meals and with exercise. Acknowledge that pre and post exercise glucose checks will be done for 3 sessions at entry of program.   [facility]  Date 10/16/19  Educ[facility] Code 1- Verbalizes Understanding      Education: Know Your Numbers and Risk Factors: -Group verbal and written instruction about important numbers in your health.  Discussion of what are risk factors and how they play a role in the disease process.   Review of Cholesterol, Blood Pressure, Diabetes, and BMI and the role they play in your overall health.   [facility]  Date 05/08/18  [facility] Code 1- Verbalizes Understanding      Core Components/Risk Factors/Patient Goals Review:   Goals and Risk Factor Review    Row Name 11/19/19 1018 12/19/19 1029           Core Components/Risk Factors/Patient Goals Review   Personal Goals Review Weight Management/Obesity;Heart Failure;Lipids;Hypertension;Diabetes;Improve shortness of breath with ADL's Weight Management/Obesity;Hypertension;Lipids;Improve shortness of breath with ADL's      Review Spoke to patient about their shortness of breath and what they can do to improve. Patient has been informed of breathing techniques when starting the program. Patient is informed to tell staff if they have had any med changes and that certain meds they are taking or not taking can be causing shortness of breath. LarKaydannts to lose more weight and has been trying his best not to eat as much. His blood pressure has been stable and he is trying to monitor it at home. He is able to do more at home and get around easier since he is able to exercise more at rehab.      Expected Outcomes Short: Attend [facility] regularly to improve shortness of breath with ADL's. Long: maintain independence with ADL's Short: continue to exercise to lose weight. Long: maintain weight loss independently at home post [facility].             Core Components/Risk Factors/Patient Goals at Discharge (Final Review):   Goals and Risk Factor Review - 12/19/19 1029      Core Components/Risk Factors/Patient Goals Review   Personal Goals Review Weight Management/Obesity;Hypertension;Lipids;Improve shortness of breath with ADL's    Review LarJoseantonionts to lose more weight and has been trying his best not to eat as much. His blood pressure has been stable and he is  trying to monitor it at home. He is able to do more at home and get around easier since he is able to exercise more at rehab.    Expected Outcomes Short: continue to exercise to lose weight. Long: maintain weight loss independently at home post [facility].           ITP Comments:  ITP Comments  Row Name 10/16/19 7915 10/29/19 1102 11/03/19 1100 11/05/19 0636 12/03/19 1103   ITP Comments Virtual Visit completed. Patient informed on EP and RD appointment and 6 Minute walk test. Patient also informed of patient health questionnaires on My Chart. Patient Verbalizes understanding. Visit diagnosis can be found in [facility] 09/27/2019. Completed 6MWT and gym orientation.  Initial ITP created and sent for review to Dr. Emily Filbert, Medical Director. First full day of exercise!  Patient was oriented to gym and equipment including functions, settings, policies, and procedures.  Patient's individual exercise prescription and treatment plan were reviewed.  All starting workloads were established based on the results of the 6 minute walk test done at initial orientation visit.  The plan for exercise progression was also introduced and progression will be customized based on patient's performance and goals. 30 Day review completed. Medical Director ITP review done, changes made as directed, and signed approval by Medical Director. 30 Day review completed. Medical Director ITP review done, changes made as directed, and signed approval by Medical Director.   [place] Name 12/31/19 0717           ITP Comments 30 Day review completed. Medical Director ITP review done, changes made as directed, and signed approval by Medical Director.              Comments:

## 2020-01-02 ENCOUNTER — Other Ambulatory Visit: Payer: Self-pay

## 2020-01-02 ENCOUNTER — Ambulatory Visit: Payer: Medicare PPO

## 2020-01-02 ENCOUNTER — Encounter: Payer: Medicare PPO | Admitting: *Deleted

## 2020-01-02 DIAGNOSIS — I5030 Unspecified diastolic (congestive) heart failure: Secondary | ICD-10-CM | POA: Diagnosis not present

## 2020-01-02 DIAGNOSIS — I5032 Chronic diastolic (congestive) heart failure: Secondary | ICD-10-CM

## 2020-01-02 NOTE — Progress Notes (Signed)
Daily Session Note  Patient Details  Name: Ernest Mclean MRN: [id number] Date of Birth: 06-04-1940 Referring Provider:     Pulmonary Rehab from 10/29/2019 in [facility] Cardiac and Pulmonary Rehab  Referring Provider Lujean Amel MD      Encounter Date: 01/02/2020  Check In:  Session Check In - 01/02/20 0958      Check-In   Supervising physician immediately available to respond to emergencies See telemetry face sheet for immediately available ER MD    Location [facility]-Cardiac & Pulmonary Rehab    Staff Present Renita Papa, RN BSN;Joseph [address] [place], [place], [place], CCRP, CCET    Virtual Visit No    Medication changes reported     No    Fall or balance concerns reported    No    Warm-up and Cool-down Performed on first and last piece of equipment    Resistance Training Performed Yes    VAD Patient? No    PAD/SET Patient? No      Pain Assessment   Currently in Pain? No/denies              Social History   Tobacco Use  Smoking Status Former Smoker  . Packs/day: 1.00  . Years: 30.00  . Pack years: 30.00  . Types: Cigarettes  . Quit date: 12/14/2001  . Years since quitting: 18.0  Smokeless Tobacco Never Used  Tobacco Comment   quit 12/14/2001    Goals Met:  Independence with exercise equipment Exercise tolerated well No report of cardiac concerns or symptoms Strength training completed today  Goals Unmet:  Not Applicable  Comments: Pt able to follow exercise prescription today without complaint.  Will continue to monitor for progression.    Dr. Emily Filbert is Medical Director for [place] and [facility].

## 2020-01-05 ENCOUNTER — Other Ambulatory Visit: Payer: Self-pay

## 2020-01-05 ENCOUNTER — Ambulatory Visit: Payer: Medicare PPO

## 2020-01-05 ENCOUNTER — Encounter: Payer: Medicare PPO | Admitting: *Deleted

## 2020-01-05 DIAGNOSIS — I5030 Unspecified diastolic (congestive) heart failure: Secondary | ICD-10-CM | POA: Diagnosis not present

## 2020-01-05 DIAGNOSIS — I5032 Chronic diastolic (congestive) heart failure: Secondary | ICD-10-CM

## 2020-01-05 NOTE — Progress Notes (Signed)
Daily Session Note  Patient Details  Name: TASEEN MARASIGAN MRN: [id number] Date of Birth: 06/01/1940 Referring Provider:     Pulmonary Rehab from 10/29/2019 in [facility] Cardiac and Pulmonary Rehab  Referring Provider Lujean Amel MD      Encounter Date: 01/05/2020  Check In:  Session Check In - 01/05/20 1040      Check-In   Supervising physician immediately available to respond to emergencies See telemetry face sheet for immediately available ER MD    Location [facility]-Cardiac & Pulmonary Rehab    Staff Present Heath Lark, RN, BSN, Laveda Norman, BS, ACSM CEP, Exercise Physiologist;Joseph Tessie Fass RCP,RRT,BSRT    Virtual Visit No    Medication changes reported     No    Fall or balance concerns reported    No    Warm-up and Cool-down Performed on first and last piece of equipment    Resistance Training Performed Yes    VAD Patient? No    PAD/SET Patient? No      Pain Assessment   Currently in Pain? No/denies              Social History   Tobacco Use  Smoking Status Former Smoker  . Packs/day: 1.00  . Years: 30.00  . Pack years: 30.00  . Types: Cigarettes  . Quit date: 12/14/2001  . Years since quitting: 18.0  Smokeless Tobacco Never Used  Tobacco Comment   quit 12/14/2001    Goals Met:  Proper associated with RPD/PD & O2 Sat Independence with exercise equipment Exercise tolerated well No report of cardiac concerns or symptoms  Goals Unmet:  Not Applicable  Comments: Pt able to follow exercise prescription today without complaint.  Will continue to monitor for progression.    Dr. Emily Filbert is Medical Director for [place] and [facility].

## 2020-01-07 ENCOUNTER — Other Ambulatory Visit: Payer: Self-pay

## 2020-01-07 ENCOUNTER — Ambulatory Visit: Payer: Medicare PPO

## 2020-01-07 ENCOUNTER — Encounter: Payer: Medicare PPO | Admitting: *Deleted

## 2020-01-07 DIAGNOSIS — I5032 Chronic diastolic (congestive) heart failure: Secondary | ICD-10-CM

## 2020-01-07 DIAGNOSIS — I5030 Unspecified diastolic (congestive) heart failure: Secondary | ICD-10-CM | POA: Diagnosis not present

## 2020-01-07 NOTE — Progress Notes (Signed)
Daily Session Note  Patient Details  Name: VERSHAWN WESTRUP MRN: [id number] Date of Birth: 1941-05-14 Referring Provider:     Pulmonary Rehab from 10/29/2019 in [facility] Cardiac and Pulmonary Rehab  Referring Provider Lujean Amel MD      Encounter Date: 01/07/2020  Check In:  Session Check In - 01/07/20 1036      Check-In   Supervising physician immediately available to respond to emergencies See telemetry face sheet for immediately available ER MD    Location [facility]Card[facility] & Pulmonary Rehab    Staff Present Heath Lark, RN, BSN, CCRP;Jessica Mason, MA, RCEP, CCRP, CCET;Joseph [employer], [place], ACSM CEP, Exercise Physiologist    Virtual Visit No    Medication changes reported     No    Fall or balance concerns reported    No    Warm-up and Cool-down Performed on first and last piece of equipment    Resistance Training Performed Yes    VAD Patient? No    PAD/SET Patient? No      Pain Assessment   Currently in Pain? No/denies              Social History   Tobacco Use  Smoking Status Former Smoker  . Packs/day: 1.00  . Years: 30.00  . Pack years: 30.00  . Types: Cigarettes  . Quit date: 12/14/2001  . Years since quitting: 18.0  Smokeless Tobacco Never Used  Tobacco Comment   quit 12/14/2001    Goals Met:  Proper associated with RPD/PD & O2 Sat Independence with exercise equipment Exercise tolerated well No report of cardiac concerns or symptoms  Goals Unmet:  Not Applicable  Comments: Pt able to follow exercise prescription today without complaint.  Will continue to monitor for progression.    Dr. Emily Filbert is Medical Director for [place] and [facility].

## 2020-01-09 ENCOUNTER — Other Ambulatory Visit: Payer: Self-pay

## 2020-01-09 ENCOUNTER — Ambulatory Visit: Payer: Medicare PPO

## 2020-01-09 ENCOUNTER — Encounter: Payer: Medicare PPO | Admitting: *Deleted

## 2020-01-09 DIAGNOSIS — I5032 Chronic diastolic (congestive) heart failure: Secondary | ICD-10-CM

## 2020-01-09 DIAGNOSIS — I5030 Unspecified diastolic (congestive) heart failure: Secondary | ICD-10-CM | POA: Diagnosis not present

## 2020-01-09 NOTE — Progress Notes (Signed)
Daily Session Note  Patient Details  Name: Ernest Mclean MRN: [id number] Date of Birth: 06-15-1940 Referring Provider:     Pulmonary Rehab from 10/29/2019 in [facility] Cardiac and Pulmonary Rehab  Referring Provider Lujean Amel MD      Encounter Date: 01/09/2020  Check In:  Session Check In - 01/09/20 1050      Check-In   Supervising physician immediately available to respond to emergencies See telemetry face sheet for immediately available ER MD    Location [facility]iac [facility] Pulmonary Rehab    Staff Present Heath Lark, RN, BSN, CCRP;Jessica [place], MA, RCEP, CCRP, CCET;Joseph [employer], [place], ACSM CEP, Exercise Physiologist    Virtual Visit No    Medication changes reported     No    Fall or balance concerns reported    No    Warm-up and Cool-down Performed on first and last piece of equipment    Resistance Training Performed Yes    VAD Patient? No    PAD/SET Patient? No      Pain Assessment   Currently in Pain? No/denies              Social History   Tobacco Use  Smoking Status Former Smoker  . Packs/day: 1.00  . Years: 30.00  . Pack years: 30.00  . Types: Cigarettes  . Quit date: 12/14/2001  . Years since quitting: 18.0  Smokeless Tobacco Never Used  Tobacco Comment   quit 12/14/2001    Goals Met:  Proper associated with RPD/PD & O2 Sat Independence with exercise equipment Exercise tolerated well No report of cardiac concerns or symptoms  Goals Unmet:  Not Applicable  Comments: Pt able to follow exercise prescription today without complaint.  Will continue to monitor for progression.    Dr. Emily Filbert is Medical Director for [place] and [facility].

## 2020-01-11 ENCOUNTER — Other Ambulatory Visit: Payer: Self-pay | Admitting: Internal Medicine

## 2020-01-12 ENCOUNTER — Other Ambulatory Visit: Payer: Self-pay

## 2020-01-12 ENCOUNTER — Ambulatory Visit: Payer: Medicare PPO

## 2020-01-12 ENCOUNTER — Encounter: Payer: Medicare PPO | Admitting: *Deleted

## 2020-01-12 DIAGNOSIS — I5030 Unspecified diastolic (congestive) heart failure: Secondary | ICD-10-CM | POA: Diagnosis not present

## 2020-01-12 DIAGNOSIS — I5032 Chronic diastolic (congestive) heart failure: Secondary | ICD-10-CM

## 2020-01-12 NOTE — Progress Notes (Signed)
Daily Session Note  Patient Details  Name: Ernest Mclean MRN: [id number] Date of Birth: 05/16/1941 Referring Provider:     Pulmonary Rehab from 10/29/2019 in [facility] Cardiac and Pulmonary Rehab  Referring Provider Lujean Amel MD      Encounter Date: 01/12/2020  Check In:  Session Check In - 01/12/20 1020      Check-In   Supervising physician immediately available to respond to emergencies See telemetry face sheet for immediately available ER MD    Location [facility]Card[facility] & Pulmonary Rehab    Staff Present Heath Lark, RN, BSN, CCRP;Jessica [place], MA, RCEP, CCRP, [place], BS, ACSM CEP, Exercise Physiologist    Virtual Visit No    Medication changes reported     No    Fall or balance concerns reported    No    Warm-up and Cool-down Performed on first and last piece of equipment    Resistance Training Performed Yes    VAD Patient? No    PAD/SET Patient? No      Pain Assessment   Currently in Pain? No/denies              Social History   Tobacco Use  Smoking Status Former Smoker  . Packs/day: 1.00  . Years: 30.00  . Pack years: 30.00  . Types: Cigarettes  . Quit date: 12/14/2001  . Years since quitting: 18.0  Smokeless Tobacco Never Used  Tobacco Comment   quit 12/14/2001    Goals Met:  Proper associated with RPD/PD & O2 Sat Independence with exercise equipment Exercise tolerated well No report of cardiac concerns or symptoms  Goals Unmet:  Not Applicable  Comments: Pt able to follow exercise prescription today without complaint.  Will continue to monitor for progression.    Dr. Emily Filbert is Medical Director for [place] and [facility].

## 2020-01-14 ENCOUNTER — Encounter: Payer: Medicare PPO | Admitting: *Deleted

## 2020-01-14 ENCOUNTER — Other Ambulatory Visit: Payer: Self-pay

## 2020-01-14 ENCOUNTER — Ambulatory Visit: Payer: Medicare PPO

## 2020-01-14 VITALS — Ht 72.5 in | Wt 223.1 lb

## 2020-01-14 DIAGNOSIS — I5032 Chronic diastolic (congestive) heart failure: Secondary | ICD-10-CM

## 2020-01-14 DIAGNOSIS — I5030 Unspecified diastolic (congestive) heart failure: Secondary | ICD-10-CM | POA: Diagnosis not present

## 2020-01-14 NOTE — Progress Notes (Signed)
Daily Session Note  Patient Details  Name: Ernest Mclean MRN: [id number] Date of Birth: 04/27/1941 Referring Provider:     Pulmonary Rehab from 10/29/2019 in [facility] Cardiac and Pulmonary Rehab  Referring Provider Callwood, Dwayne MD      Encounter Date: 01/14/2020  Check In:  Session Check In - 01/14/20 0945      Check-In   Supervising physician immediately available to respond to emergencies See telemetry face sheet for immediately available ER MD    Location [facility]-Card[facility] & Pulmonary Rehab    Staff Present Susanne Bice, RN, BSN, CCRP;Jessica Hawkins, MA, RCEP, CCRP, CCET;Amanda Sommer, BA, ACSM CEP, Exercise Physiologist;Kara Langdon, MS Exercise Physiologist    Virtual Visit No    Medication changes reported     No    Fall or balance concerns reported    No    Warm-up and Cool-down Performed on first and last piece of equipment    Resistance Training Performed Yes    VAD Patient? No    PAD/SET Patient? No      Pain Assessment   Currently in Pain? No/denies              Social History   Tobacco Use  Smoking Status Former Smoker  . Packs/day: 1.00  . Years: 30.00  . Pack years: 30.00  . Types: Cigarettes  . Quit date: 12/14/2001  . Years since quitting: 18.0  Smokeless Tobacco Never Used  Tobacco Comment   quit 12/14/2001    Goals Met:  Proper associated with RPD/PD & O2 Sat Independence with exercise equipment Using PLB without cueing & demonstrates good technique Exercise tolerated well No report of cardiac concerns or symptoms Strength training completed today  Goals Unmet:  Not Applicable  Comments: Pt able to follow exercise prescription today without complaint.  Will continue to monitor for progression.   6 Minute Walk    Row Name 10/29/19 1103 01/14/20 1108       6 Minute Walk   Phase Initial Discharge    Distance 815 feet 995 feet    Distance % Change -- 22 %    Distance Feet Change -- 180 ft    Walk Time 5.88 minutes 6 minutes    # of Rest  Breaks 1  7 sec 0    MPH 1.57 1.88    METS 1.5 1.8    RPE 15 13    Perceived Dyspnea  3 2    VO2 Peak 5.23 6.32    Symptoms Yes (comment) No    Comments calf pain 8/10 --    Resting HR 56 bpm 55 bpm    Resting BP 108/64 136/62    Resting Oxygen Saturation  96 % 97 %    Exercise Oxygen Saturation  during 6 min walk 88 % 95 %    Max Ex. HR 88 bpm 79 bpm    Max Ex. BP 134/56 148/58    2 Minute Post BP 128/70 --      Interval HR   1 Minute HR 64 65    2 Minute HR 66 63    3 Minute HR 72 63    4 Minute HR 81 70    5 Minute HR 72 62    6 Minute HR 88 79    2 Minute Post HR 67 --    Interval Heart Rate? Yes Yes      Interval Oxygen   Interval Oxygen? Yes Yes    Baseline Oxygen Saturation %   96 % 97 %    1 Minute Oxygen Saturation % 89 % 96 %    1 Minute Liters of Oxygen 2 L  continuous 2 L  continuous    2 Minute Oxygen Saturation % 88 % 96 %    2 Minute Liters of Oxygen 2 L 2 L    3 Minute Oxygen Saturation % 88 % 96 %    3 Minute Liters of Oxygen 2 L 2 L    4 Minute Oxygen Saturation % 89 % 96 %    4 Minute Liters of Oxygen 2 L 2 L    5 Minute Oxygen Saturation % 90 % 95 %    5 Minute Liters of Oxygen 0 L 2 L    6 Minute Oxygen Saturation % 89 % 95 %    6 Minute Liters of Oxygen 0 L 2 L    2 Minute Post Oxygen Saturation % 96 % --    2 Minute Post Liters of Oxygen 2 L --             Dr. Mark Miller is Medical Director for [facility] and [facility]. 

## 2020-01-16 ENCOUNTER — Other Ambulatory Visit: Payer: Self-pay

## 2020-01-16 ENCOUNTER — Ambulatory Visit: Payer: Medicare PPO

## 2020-01-16 DIAGNOSIS — I5032 Chronic diastolic (congestive) heart failure: Secondary | ICD-10-CM

## 2020-01-16 DIAGNOSIS — I5030 Unspecified diastolic (congestive) heart failure: Secondary | ICD-10-CM | POA: Diagnosis not present

## 2020-01-16 NOTE — Progress Notes (Signed)
Daily Session Note  Patient Details  Name: BELMONT VALLI MRN: [id number] Date of Birth: 1940/11/04 Referring Provider:     Pulmonary Rehab from 10/29/2019 in [facility] Cardiac and Pulmonary Rehab  Referring Provider Lujean Amel MD      Encounter Date: 01/16/2020  Check In:  Session Check In - 01/16/20 1013      Check-In   Supervising physician immediately available to respond to emergencies See telemetry face sheet for immediately available ER MD    Location [facility]-Cardiac & Pulmonary Rehab    Staff Present Carson Myrtle, BS, RRT, CPFT;Vida Rigger RN, BSN;Jessica Luan Pulling, MA, RCEP, CCRP, CCET    Virtual Visit No    Medication changes reported     No    Warm-up and Cool-down Performed on first and last piece of equipment    Resistance Training Performed Yes    VAD Patient? No    PAD/SET Patient? No      Pain Assessment   Currently in Pain? No/denies              Social History   Tobacco Use  Smoking Status Former Smoker  . Packs/day: 1.00  . Years: 30.00  . Pack years: 30.00  . Types: Cigarettes  . Quit date: 12/14/2001  . Years since quitting: 18.1  Smokeless Tobacco Never Used  Tobacco Comment   quit 12/14/2001    Goals Met:  Proper associated with RPD/PD & O2 Sat Independence with exercise equipment Exercise tolerated well No report of cardiac concerns or symptoms Strength training completed today  Goals Unmet:  Not Applicable  Comments: Pt able to follow exercise prescription today without complaint.  Will continue to monitor for progression.   Dr. Emily Filbert is Medical Director for [place] and [facility].

## 2020-01-19 ENCOUNTER — Other Ambulatory Visit: Payer: Self-pay

## 2020-01-19 ENCOUNTER — Ambulatory Visit: Payer: Medicare PPO

## 2020-01-19 ENCOUNTER — Encounter: Payer: Medicare PPO | Admitting: *Deleted

## 2020-01-19 DIAGNOSIS — I5030 Unspecified diastolic (congestive) heart failure: Secondary | ICD-10-CM | POA: Diagnosis not present

## 2020-01-19 DIAGNOSIS — I5032 Chronic diastolic (congestive) heart failure: Secondary | ICD-10-CM

## 2020-01-19 NOTE — Progress Notes (Signed)
Daily Session Note  Patient Details  Name: Ernest Mclean MRN: [id number] Date of Birth: 01/22/1941 Referring Provider:     Pulmonary Rehab from 10/29/2019 in [facility] Cardiac and Pulmonary Rehab  Referring Provider Lujean Amel MD      Encounter Date: 01/19/2020  Check In:  Session Check In - 01/19/20 0950      Check-In   Supervising physician immediately available to respond to emergencies See telemetry face sheet for immediately available ER MD    Location [facility]-Cardiac & Pulmonary Rehab    Staff Present Heath Lark, RN, BSN, Laveda Norman, BS, ACSM CEP, Exercise Physiologist;Joseph Tessie Fass RCP,RRT,BSRT    Virtual Visit No    Medication changes reported     No    Fall or balance concerns reported    No    Warm-up and Cool-down Performed on first and last piece of equipment    Resistance Training Performed Yes    VAD Patient? No    PAD/SET Patient? No      Pain Assessment   Currently in Pain? No/denies              Social History   Tobacco Use  Smoking Status Former Smoker  . Packs/day: 1.00  . Years: 30.00  . Pack years: 30.00  . Types: Cigarettes  . Quit date: 12/14/2001  . Years since quitting: 18.1  Smokeless Tobacco Never Used  Tobacco Comment   quit 12/14/2001    Goals Met:  Proper associated with RPD/PD & O2 Sat Independence with exercise equipment Exercise tolerated well No report of cardiac concerns or symptoms  Goals Unmet:  Not Applicable  Comments: Pt able to follow exercise prescription today without complaint.  Will continue to monitor for progression.    Dr. Emily Filbert is Medical Director for [place] and [facility].

## 2020-01-21 ENCOUNTER — Encounter: Payer: Medicare PPO | Attending: Internal Medicine | Admitting: *Deleted

## 2020-01-21 ENCOUNTER — Other Ambulatory Visit: Payer: Self-pay

## 2020-01-21 ENCOUNTER — Ambulatory Visit: Payer: Medicare PPO

## 2020-01-21 DIAGNOSIS — I5030 Unspecified diastolic (congestive) heart failure: Secondary | ICD-10-CM | POA: Insufficient documentation

## 2020-01-21 DIAGNOSIS — Z79899 Other long term (current) drug therapy: Secondary | ICD-10-CM | POA: Insufficient documentation

## 2020-01-21 DIAGNOSIS — Z87891 Personal history of nicotine dependence: Secondary | ICD-10-CM | POA: Insufficient documentation

## 2020-01-21 DIAGNOSIS — I5032 Chronic diastolic (congestive) heart failure: Secondary | ICD-10-CM

## 2020-01-21 NOTE — Progress Notes (Signed)
Daily Session Note  Patient Details  Name: Ernest Mclean MRN: [id number] Date of Birth: 1941-05-21 Referring Provider:     Pulmonary Rehab from 10/29/2019 in [facility] Cardiac and Pulmonary Rehab  Referring Provider Lujean Amel MD      Encounter Date: 01/21/2020  Check In:  Session Check In - 01/21/20 1147      Check-In   Supervising physician immediately available to respond to emergencies See telemetry face sheet for immediately available ER MD    Location [facility]-Cardiac & Pulmonary Rehab    Staff Present Renita Papa, RN BSN;Joseph Lou Miner, [place] Exercise Physiologist;Jessica [place], [place], RCEP, CCRP, CCET    Virtual Visit No    Medication changes reported     No    Fall or balance concerns reported    No    Warm-up and Cool-down Performed on first and last piece of equipment    Resistance Training Performed Yes    VAD Patient? No    PAD/SET Patient? No      Pain Assessment   Currently in Pain? No/denies              Social History   Tobacco Use  Smoking Status Former Smoker  . Packs/day: 1.00  . Years: 30.00  . Pack years: 30.00  . Types: Cigarettes  . Quit date: 12/14/2001  . Years since quitting: 18.1  Smokeless Tobacco Never Used  Tobacco Comment   quit 12/14/2001    Goals Met:  Independence with exercise equipment Exercise tolerated well No report of cardiac concerns or symptoms Strength training completed today  Goals Unmet:  Not Applicable  Comments: Pt able to follow exercise prescription today without complaint.  Will continue to monitor for progression.    Dr. Emily Filbert is Medical Director for [place] and [facility].

## 2020-01-23 ENCOUNTER — Ambulatory Visit: Payer: Medicare PPO

## 2020-01-23 ENCOUNTER — Other Ambulatory Visit: Payer: Self-pay

## 2020-01-23 DIAGNOSIS — I5032 Chronic diastolic (congestive) heart failure: Secondary | ICD-10-CM

## 2020-01-23 DIAGNOSIS — J449 Chronic obstructive pulmonary disease, unspecified: Secondary | ICD-10-CM

## 2020-01-23 DIAGNOSIS — I5030 Unspecified diastolic (congestive) heart failure: Secondary | ICD-10-CM | POA: Diagnosis not present

## 2020-01-23 NOTE — Progress Notes (Signed)
Daily Session Note  Patient Details  Name: Ernest Mclean MRN: [id number] Date of Birth: 1940-08-24 Referring Provider:     Pulmonary Rehab from 10/29/2019 in [facility] Cardiac and Pulmonary Rehab  Referring Provider Lujean Amel MD      Encounter Date: 01/23/2020  Check In:  Session Check In - 01/23/20 1013      Check-In   Supervising physician immediately available to respond to emergencies See telemetry face sheet for immediately available ER MD    Location [facility]-Cardiac & Pulmonary Rehab    Staff Present Vida Rigger RN, BSN;Jessica [place], MA, RCEP, CCRP, CCET;Melissa Caiola RDN, LDN    Virtual Visit No    Medication changes reported     No    Fall or balance concerns reported    No    Warm-up and Cool-down Performed on first and last piece of equipment    Resistance Training Performed Yes    VAD Patient? No    PAD/SET Patient? No      Pain Assessment   Currently in Pain? No/denies              Social History   Tobacco Use  Smoking Status Former Smoker  . Packs/day: 1.00  . Years: 30.00  . Pack years: 30.00  . Types: Cigarettes  . Quit date: 12/14/2001  . Years since quitting: 18.1  Smokeless Tobacco Never Used  Tobacco Comment   quit 12/14/2001    Goals Met:  Proper associated with RPD/PD & O2 Sat Independence with exercise equipment Exercise tolerated well No report of cardiac concerns or symptoms Strength training completed today  Goals Unmet:  Not Applicable  Comments: Pt able to follow exercise prescription today without complaint.  Will continue to monitor for progression.   Dr. Emily Filbert is Medical Director for [place] and [facility].

## 2020-01-27 NOTE — Patient Instructions (Signed)
Discharge Patient Instructions  Patient Details  Name: Ernest Mclean MRN: [id number] Date of Birth: Jun 16, 1940 Referring Provider:  Juluis Pitch, MD   Number of Visits: 54  Reason for Discharge:  Patient reached a stable level of exercise. Patient independent in their exercise. Patient has met program and personal goals.  Smoking History:  Social History   Tobacco Use  Smoking Status Former Smoker  . Packs/day: 1.00  . Years: 30.00  . Pack years: 30.00  . Types: Cigarettes  . Quit date: 12/14/2001  . Years since quitting: 18.1  Smokeless Tobacco Never Used  Tobacco Comment   quit 12/14/2001    Diagnosis:  Heart failure, diastolic, chronic (HCC)  Chronic obstructive pulmonary disease, unspecified COPD type ([place])  Initial Exercise Prescription:  Initial Exercise Prescription - 10/29/19 1100      Date of Initial Exercise RX and Referring Provider   Date 10/29/19    Referring Provider Lujean Amel MD      Oxygen   Oxygen Continuous    Liters 2      Recumbant Bike   Level 1    RPM 50    Watts 16    Minutes 15    METs 1.5      NuStep   Level 1    SPM 80    Minutes 15    METs 1.5      Arm Ergometer   Level 1    Watts 23    RPM 25    Minutes 15    METs 1.5      Biostep-RELP   Level 1    SPM 50    Minutes 15    METs 1      Prescription Details   Frequency (times per week) 3    Duration Progress to 30 minutes of continuous aerobic without signs/symptoms of physical distress      Intensity   THRR 40-80% of Max Heartrate 91-126    Ratings of Perceived Exertion 11-13    Perceived Dyspnea 0-4      Progression   Progression Continue to progress workloads to maintain intensity without signs/symptoms of physical distress.      Resistance Training   Training Prescription Yes    Weight 3 lb    Reps 10-15           Discharge Exercise Prescription (Final Exercise Prescription Changes):  Exercise Prescription Changes - 01/14/20 1400       Response to Exercise   Blood Pressure (Admit) 140/84    Blood Pressure (Exercise) 146/68    Blood Pressure (Exit) 148/82    Heart Rate (Admit) 62 bpm    Heart Rate (Exercise) 63 bpm    Heart Rate (Exit) 60 bpm    Oxygen Saturation (Admit) 98 %    Oxygen Saturation (Exercise) 96 %    Oxygen Saturation (Exit) 98 %    Rating of Perceived Exertion (Exercise) 13    Perceived Dyspnea (Exercise) 2    Symptoms none    Duration Continue with 30 min of aerobic exercise without signs/symptoms of physical distress.    Intensity THRR unchanged      Progression   Progression Continue to progress workloads to maintain intensity without signs/symptoms of physical distress.    Average METs 2.55      Resistance Training   Training Prescription Yes    Weight 6 lb    Reps 10-15      Interval Training   Interval Training No  Oxygen   Oxygen Continuous    Liters 2      NuStep   Level 6    Minutes 15    METs 2.8      Arm Ergometer   Level 1    Minutes 15    METs 2.3      Recumbant Elliptical   Level 1    Minutes 15    METs 2.1      Biostep-RELP   Level 6    Minutes 15    METs 3      Home Exercise Plan   Plans to continue exercise at Home (comment)   walking   Frequency Add 2 additional days to program exercise sessions.    Initial Home Exercises Provided 01/12/20           Functional Capacity:  6 Minute Walk    Row Name 10/29/19 1103 01/14/20 1108       6 Minute Walk   Phase Initial Discharge    Distance 815 feet 995 feet    Distance % Change -- 22 %    Distance Feet Change -- 180 ft    Walk Time 5.88 minutes 6 minutes    # of Rest Breaks 1  7 sec 0    MPH 1.57 1.88    METS 1.5 1.8    RPE 15 13    Perceived Dyspnea  3 2    VO2 Peak 5.23 6.32    Symptoms Yes (comment) No    Comments calf pain 8/10 --    Resting HR 56 bpm 55 bpm    Resting BP 108/64 136/62    Resting Oxygen Saturation  96 % 97 %    Exercise Oxygen Saturation  during 6 min walk 88 % 95 %     Max Ex. HR 88 bpm 79 bpm    Max Ex. BP 134/56 148/58    2 Minute Post BP 128/70 --      Interval HR   1 Minute HR 64 65    2 Minute HR 66 63    3 Minute HR 72 63    4 Minute HR 81 70    5 Minute HR 72 62    6 Minute HR 88 79    2 Minute Post HR 67 --    Interval Heart Rate? Yes Yes      Interval Oxygen   Interval Oxygen? Yes Yes    Baseline Oxygen Saturation % 96 % 97 %    1 Minute Oxygen Saturation % 89 % 96 %    1 Minute Liters of Oxygen 2 L  continuous 2 L  continuous    2 Minute Oxygen Saturation % 88 % 96 %    2 Minute Liters of Oxygen 2 L 2 L    3 Minute Oxygen Saturation % 88 % 96 %    3 Minute Liters of Oxygen 2 L 2 L    4 Minute Oxygen Saturation % 89 % 96 %    4 Minute Liters of Oxygen 2 L 2 L    5 Minute Oxygen Saturation % 90 % 95 %    5 Minute Liters of Oxygen 0 L 2 L    6 Minute Oxygen Saturation % 89 % 95 %    6 Minute Liters of Oxygen 0 L 2 L    2 Minute Post Oxygen Saturation % 96 % --    2 Minute Post Liters of Oxygen 2  L --           Nutrition & Weight - Outcomes:  Pre Biometrics - 10/29/19 1126      Pre Biometrics   Height 6' 0.5" (1.842 m)    Weight 223 lb 8 oz (101.4 kg)    BMI (Calculated) 29.88    Single Leg Stand 0 seconds           Post Biometrics - 01/14/20 1110       Post  Biometrics   Height 6' 0.5" (1.842 m)    Weight 223 lb 1.6 oz (101.2 kg)    BMI (Calculated) 29.83           Nutrition:  Nutrition Therapy & Goals - 01/05/20 1010      Nutrition Therapy   Diet T2DM, low Na, heart healthy    Fiber 30 grams    Whole Grain Foods 3 servings    Saturated Fats 12 max. grams    Fruits and Vegetables 5 servings/day    Sodium 1.5 grams      Personal Nutrition Goals   Nutrition Goal ST: continue heart healthy eating and including heart healthy snacks. LT: increase functional years, manage BG and CKD.    Comments Pt primary diagnosis for Pulmonary Rehab is CHF. Pt also presents with CAD, HTN, A-fib, COPD, GERD, T2DM (A1C  8.7), CKD stage 3, hyperparathyroidism, anxiety, and OSA on CPAP. 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 Pt also presents with COPD exacerbation. Current Relevant Medications: Lipitor, pravachol, B12, vitamin D, vitamin C, lasix, lantus, humalog, metformin, lyrica, celexa, valum. Wife is a nutritionist who used to work at [employer]. 2037m Na. 4 meals/day - insulin shots and one snack. 5 CHOS at each meal B: oatmeal mixed with some fruit or oatmeal muffins. L: 3 oz meat (red meat and chicken- red meat 3x/month), vegetables (steamed) and fruit D: same as lunch (keeps foods varied especially fruits and vegetables) S: light snack Drinks: water or diet gingerale (small glass). Pt reports putting olive oil and vinegar on vegetables and nuts in oatmeal. discussed heart healthy snacking as pt reports being hungry sometimes during the day; sugar free jelly and peanut butter on wheat bread. Pt reports wife closely monitoring and measuring what he is eating. Discussed heart healthy and diabetes friendly eating.      Intervention Plan   Intervention Prescribe, educate and counsel regarding individualized specific dietary modifications aiming towards targeted core components such as weight, hypertension, lipid management, diabetes, heart failure and other comorbidities.    Expected Outcomes Short Term Goal: A plan has been developed with personal nutrition goals set during dietitian appointment.;Long Term Goal: Adherence to prescribed nutrition plan.;Short Term Goal: Understand basic principles of dietary content, such as calories, fat, sodium, cholesterol and nutrients.            Goals reviewed with patient; copy given to patient.

## 2020-01-28 ENCOUNTER — Encounter: Payer: Medicare PPO | Admitting: *Deleted

## 2020-01-28 ENCOUNTER — Other Ambulatory Visit: Payer: Self-pay

## 2020-01-28 ENCOUNTER — Ambulatory Visit: Payer: Medicare PPO

## 2020-01-28 DIAGNOSIS — I5032 Chronic diastolic (congestive) heart failure: Secondary | ICD-10-CM

## 2020-01-28 DIAGNOSIS — I5030 Unspecified diastolic (congestive) heart failure: Secondary | ICD-10-CM | POA: Diagnosis not present

## 2020-01-28 NOTE — Progress Notes (Signed)
Daily Session Note  Patient Details  Name: Ernest Mclean MRN: [id number] Date of Birth: June 25, 1940 Referring Provider:     Pulmonary Rehab from 10/29/2019 in [facility]  Referring Provider Lujean Amel MD      Encounter Date: 01/28/2020  Check In:  Session Check In - 01/28/20 0946      Check-In   Supervising physician immediately available to respond to emergencies See telemetry face sheet for immediately available ER MD    Location [facility]-Cardiac & Pulmonary Rehab    Staff Present Renita Papa, RN BSN;Joseph Foy [place], [place], ACSM CEP, Exercise Physiologist;Melissa Caiola RDN, LDN    Virtual Visit No    Medication changes reported     No    Fall or balance concerns reported    No    Warm-up and Cool-down Performed on first and last piece of equipment    Resistance Training Performed Yes    VAD Patient? No    PAD/SET Patient? No      Pain Assessment   Currently in Pain? No/denies              Social History   Tobacco Use  Smoking Status Former Smoker  . Packs/day: 1.00  . Years: 30.00  . Pack years: 30.00  . Types: Cigarettes  . Quit date: 12/14/2001  . Years since quitting: 18.1  Smokeless Tobacco Never Used  Tobacco Comment   quit 12/14/2001    Goals Met:  Independence with exercise equipment Exercise tolerated well No report of cardiac concerns or symptoms Strength training completed today  Goals Unmet:  Not Applicable  Comments:  Harrel graduated today from  rehab with 36 sessions completed.  Details of the patient's exercise prescription and what He needs to do in order to continue the prescription and progress were discussed with patient.  Patient was given a copy of prescription and goals.  Patient verbalized understanding.  Ernest Mclean plans to continue to exercise by walking.    Dr. Emily Filbert is Medical Director for [place] and [facility].

## 2020-01-28 NOTE — Progress Notes (Signed)
Discharge Progress Report  Patient Details  Name: Ernest Mclean MRN: [id number] Date of Birth: [birth year] Referring Provider:     Pulmonary Rehab from 10/29/2019 in [facility]  Referring Provider Lujean Amel MD       Number of Visits: 36  Reason for Discharge:  Patient reached a stable level of exercise. Patient independent in their exercise. Patient has met program and personal goals.  Smoking History:  Social History   Tobacco Use  Smoking Status Former Smoker  . Packs/day: 1.00  . Years: 30.00  . Pack years: 30.00  . Types: Cigarettes  . Quit date: 12/14/2001  . Years since quitting: 18.1  Smokeless Tobacco Never Used  Tobacco Comment   quit 12/14/2001    Diagnosis:  Heart failure, diastolic, chronic (HCC)  ADL UCSD:  Pulmonary Assessment Scores    Row Name 10/29/19 1130 01/14/20 1111       ADL UCSD   ADL Phase Entry Exit    SOB Score total 30 --    Rest 0 --    Walk 3 --    Stairs 2 --    Bath 0 --    Dress 0 --    Shop 1 --      CAT Score   CAT Score 7 --      mMRC Score   mMRC Score 3 3           Initial Exercise Prescription:  Initial Exercise Prescription - 10/29/19 1100      Date of Initial Exercise RX and Referring Provider   Date 10/29/19    Referring Provider Lujean Amel MD      Oxygen   Oxygen Continuous    Liters 2      Recumbant Bike   Level 1    RPM 50    Watts 16    Minutes 15    METs 1.5      NuStep   Level 1    SPM 80    Minutes 15    METs 1.5      Arm Ergometer   Level 1    Watts 23    RPM 25    Minutes 15    METs 1.5      Biostep-RELP   Level 1    SPM 50    Minutes 15    METs 1      Prescription Details   Frequency (times per week) 3    Duration Progress to 30 minutes of continuous aerobic without signs/symptoms of physical distress      Intensity   THRR 40-80% of Max Heartrate 91-126    Ratings of Perceived Exertion 11-13    Perceived Dyspnea 0-4       Progression   Progression Continue to progress workloads to maintain intensity without signs/symptoms of physical distress.      Resistance Training   Training Prescription Yes    Weight 3 lb    Reps 10-15           Discharge Exercise Prescription (Final Exercise Prescription Changes):  Exercise Prescription Changes - 01/27/20 1400      Response to Exercise   Blood Pressure (Admit) 134/64    Blood Pressure (Exercise) 124/64    Blood Pressure (Exit) 124/66    Heart Rate (Admit) 74 bpm    Heart Rate (Exercise) 81 bpm    Heart Rate (Exit) 85 bpm    Oxygen Saturation (Admit) 94 %  Oxygen Saturation (Exercise) 94 %    Oxygen Saturation (Exit) 98 %    Rating of Perceived Exertion (Exercise) 13    Perceived Dyspnea (Exercise) 2    Symptoms none    Duration Continue with 30 min of aerobic exercise without signs/symptoms of physical distress.    Intensity THRR unchanged      Progression   Progression Continue to progress workloads to maintain intensity without signs/symptoms of physical distress.    Average METs 2.2      Resistance Training   Training Prescription Yes    Weight 4 lb    Reps 10-15      Interval Training   Interval Training No      Oxygen   Oxygen Continuous    Liters 2      Treadmill   MPH 1    Grade 0    Minutes 15    METs 1.77      REL-XR   Level 5    Minutes 15    METs 2.5      Home Exercise Plan   Plans to continue exercise at Home (comment)   walking   Frequency Add 2 additional days to program exercise sessions.    Initial Home Exercises Provided 01/12/20           Functional Capacity:  6 Minute Walk    Row Name 10/29/19 1103 01/14/20 1108       6 Minute Walk   Phase Initial Discharge    Distance 815 feet 995 feet    Distance % Change -- 22 %    Distance Feet Change -- 180 ft    Walk Time 5.88 minutes 6 minutes    # of Rest Breaks 1  7 sec 0    MPH 1.57 1.88    METS 1.5 1.8    RPE 15 13    Perceived Dyspnea  3 2    VO2  Peak 5.23 6.32    Symptoms Yes (comment) No    Comments calf pain 8/10 --    Resting HR 56 bpm 55 bpm    Resting BP 108/64 136/62    Resting Oxygen Saturation  96 % 97 %    Exercise Oxygen Saturation  during 6 min walk 88 % 95 %    Max Ex. HR 88 bpm 79 bpm    Max Ex. BP 134/56 148/58    2 Minute Post BP 128/70 --      Interval HR   1 Minute HR 64 65    2 Minute HR 66 63    3 Minute HR 72 63    4 Minute HR 81 70    5 Minute HR 72 62    6 Minute HR 88 79    2 Minute Post HR 67 --    Interval Heart Rate? Yes Yes      Interval Oxygen   Interval Oxygen? Yes Yes    Baseline Oxygen Saturation % 96 % 97 %    1 Minute Oxygen Saturation % 89 % 96 %    1 Minute Liters of Oxygen 2 L  continuous 2 L  continuous    2 Minute Oxygen Saturation % 88 % 96 %    2 Minute Liters of Oxygen 2 L 2 L    3 Minute Oxygen Saturation % 88 % 96 %    3 Minute Liters of Oxygen 2 L 2 L    4 Minute Oxygen Saturation %  89 % 96 %    4 Minute Liters of Oxygen 2 L 2 L    5 Minute Oxygen Saturation % 90 % 95 %    5 Minute Liters of Oxygen 0 L 2 L    6 Minute Oxygen Saturation % 89 % 95 %    6 Minute Liters of Oxygen 0 L 2 L    2 Minute Post Oxygen Saturation % 96 % --    2 Minute Post Liters of Oxygen 2 L --           Psychological, QOL, Others - Outcomes: PHQ 2/9: Depression screen [facility] 2/9 10/29/2019 04/22/2018 04/01/2018 07/16/2017 03/19/2017  Decreased Interest 1 0 0 0 0  Down, Depressed, Hopeless 1 0 0 0 0  PHQ - 2 Score 2 0 0 0 0  Altered sleeping 0 0 0 - -  Tired, decreased energy 1 0 1 - -  Change in appetite 0 0 0 - -  Feeling bad or failure about yourself  1 0 0 - -  Trouble concentrating 0 0 0 - -  Moving slowly or fidgety/restless 0 0 0 - -  Suicidal thoughts 0 0 0 - -  PHQ-9 Score 4 0 1 - -  Difficult doing work/chores Not difficult at all Not difficult at all Not difficult at all - -   Nutrition & Weight - Outcomes:  Pre Biometrics - 10/29/19 1126      Pre Biometrics   Height 6' 0.5"  (1.842 m)    Weight 223 lb 8 oz (101.4 kg)    BMI (Calculated) 29.88    Single Leg Stand 0 seconds           Post Biometrics - 01/14/20 1110       Post  Biometrics   Height 6' 0.5" (1.842 m)    Weight 223 lb 1.6 oz (101.2 kg)    BMI (Calculated) 29.83           Nutrition:  Nutrition Therapy & Goals - 01/05/20 1010      Nutrition Therapy   Diet T2DM, low Na, heart healthy    Fiber 30 grams    Whole Grain Foods 3 servings    Saturated Fats 12 max. grams    Fruits and Vegetables 5 servings/day    Sodium 1.5 grams      Personal Nutrition Goals   Nutrition Goal ST: continue heart healthy eating and including heart healthy snacks. LT: increase functional years, manage BG and CKD.    Comments Pt primary diagnosis for Pulmonary Rehab is CHF. Pt also presents with CAD, HTN, A-fib, COPD, GERD, T2DM (A1C 8.7), CKD stage 3, hyperparathyroidism, anxiety, and OSA on CPAP. 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 Pt also presents with COPD exacerbation. Current Relevant Medications: Lipitor, pravachol, B12, vitamin D, vitamin C, lasix, lantus, humalog, metformin, lyrica, celexa, valum. Wife is a nutritionist who used to work at [employer]. 2046m Na. 4 meals/day - insulin shots and one snack. 5 CHOS at each meal B: oatmeal mixed with some fruit or oatmeal muffins. L: 3 oz meat (red meat and chicken- red meat 3x/month), vegetables (steamed) and fruit D: same as lunch (keeps foods varied especially fruits and vegetables) S: light snack Drinks: water or diet gingerale (small glass). Pt reports putting olive oil and vinegar on vegetables and nuts in oatmeal. discussed heart healthy snacking as pt reports being hungry sometimes during the day; sugar free jelly and  peanut butter on wheat bread. Pt reports wife closely monitoring and measuring what he is eating. Discussed heart healthy and diabetes friendly eating.      Intervention Plan   Intervention Prescribe, educate and  counsel regarding individualized specific dietary modifications aiming towards targeted core components such as weight, hypertension, lipid management, diabetes, heart failure and other comorbidities.    Expected Outcomes Short Term Goal: A plan has been developed with personal nutrition goals set during dietitian appointment.;Long Term Goal: Adherence to prescribed nutrition plan.;Short Term Goal: Understand basic principles of dietary content, such as calories, fat, sodium, cholesterol and nutrients.           Nutrition Discharge:  Nutrition Assessments - 10/29/19 1127      MEDFICTS Scores   Pre Score 42           Education Questionnaire Score:  Knowledge Questionnaire Score - 10/29/19 1128      Knowledge Questionnaire Score   Pre Score 17/18 Education Focus: O2 safety           Goals reviewed with patient; copy given to patient.

## 2020-01-28 NOTE — Progress Notes (Signed)
Pulmonary Individual Treatment Plan  Patient Details  Name: Ernest Mclean MRN: [id number] Date of Birth: 11-23-1940 Referring Provider:     Pulmonary Rehab from 10/29/2019 in [facility] Cardiac and Pulmonary Rehab  Referring Provider Lujean Amel MD      Initial Encounter Date:    Pulmonary Rehab from 10/29/2019 in [facility] Cardiac and Pulmonary Rehab  Date 10/29/19      Visit Diagnosis: Heart failure, diastolic, chronic ([place])  Patient's Home Medications on Admission:  Current Outpatient Medications:  .  albuterol (VENTOLIN HFA) 108 (90 Base) MCG/ACT inhaler, Inhale 2 puffs into the lungs every 4 (four) hours as needed for wheezing or shortness of breath., Disp: 18 g, Rfl: 3 .  amLODipine (NORVASC) 5 MG tablet, Take 5 mg by mouth daily. , Disp: , Rfl:  .  apixaban (ELIQUIS) 5 MG TABS tablet, Take 2.5 mg by mouth 2 (two) times daily. , Disp: , Rfl:  .  aspirin EC 81 MG tablet, Take 81 mg by mouth daily., Disp: , Rfl:  .  atorvastatin (LIPITOR) 20 MG tablet, Take by mouth., Disp: , Rfl:  .  BD INSULIN SYRINGE U/F 31G X 5/16" 0.5 ML MISC, , Disp: , Rfl:  .  citalopram (CELEXA) 20 MG tablet, Take 20 mg by mouth at bedtime. , Disp: , Rfl: 5 .  CRANBERRY PO, Take 820 mg by mouth daily., Disp: , Rfl:  .  cyanocobalamin 500 MCG tablet, Take 500 mcg by mouth daily., Disp: , Rfl:  .  diazepam (VALIUM) 5 MG tablet, Take 5 mg by mouth every 12 (twelve) hours as needed for anxiety. , Disp: , Rfl:  .  ergocalciferol (VITAMIN D2) 50000 units capsule, Take 50,000 Units by mouth every 30 (thirty) days., Disp: , Rfl:  .  Fluticasone-Salmeterol (WIXELA INHUB) 500-50 MCG/DOSE AEPB, Inhale 1 puff into the lungs 2 (two) times daily., Disp: 180 each, Rfl: 3 .  furosemide (LASIX) 20 MG tablet, Take 20 mg by mouth daily. , Disp: , Rfl:  .  HUMALOG 100 UNIT/ML injection, Inject 10-25 Units into the skin 3 (three) times daily with meals. (sliding scale as needed to a maximum of 150u daily), Disp: , Rfl:  .  insulin  glargine (LANTUS) 100 UNIT/ML injection, Inject 60 Units into the skin at bedtime. , Disp: , Rfl:  .  lisinopril (PRINIVIL,ZESTRIL) 5 MG tablet, Take 5 mg by mouth daily., Disp: , Rfl:  .  metFORMIN (GLUCOPHAGE-XR) 500 MG 24 hr tablet, Take 1,000 mg by mouth daily with supper., Disp: , Rfl:  .  metoprolol tartrate (LOPRESSOR) 100 MG tablet, Take 0.5 tablets (50 mg total) by mouth 2 (two) times daily. This is a decrease from your previous 100 mg twice a day., Disp: , Rfl: 0 .  OMEGA-3 FATTY ACIDS-VITAMIN E PO, Take 1 capsule by mouth daily., Disp: , Rfl:  .  omeprazole (PRILOSEC) 20 MG capsule, Take 20 mg by mouth daily., Disp: , Rfl:  .  pravastatin (PRAVACHOL) 20 MG tablet, Take 20 mg by mouth every evening., Disp: , Rfl:  .  pregabalin (LYRICA) 25 MG capsule, Take 25 mg by mouth 2 (two) times daily., Disp: , Rfl:  .  tiotropium (SPIRIVA) 18 MCG inhalation capsule, Place 18 mcg into inhaler and inhale daily., Disp: , Rfl:  .  vitamin C (ASCORBIC ACID) 500 MG tablet, Take 500 mg by mouth daily., Disp: , Rfl:   Past Medical History: Past Medical History:  Diagnosis Date  . Atrial fibrillation ([place])   .  Basal cell carcinoma 06/2017   Left Ear  . CAD (coronary artery disease)   . CKD (chronic kidney disease)   . Congestive heart failure ([place])   . COPD (chronic obstructive pulmonary disease) ([place])   . Diabetes ([place])   . Hypertension   . OSA on CPAP   . 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 Packs/day: 1.00  . Years: 30.00  . Pack years: 30.00  . Types: Cigarettes  . Quit date: 12/14/2001  . Years since quitting: 18.1  Smokeless Tobacco Never Used  Tobacco Comment   quit 12/14/2001    Labs: Recent Review Flowsheet Data    Labs for ITP Cardiac and Pulmonary Rehab Latest Ref Rng & Units 06/23/2014 05/13/2015 08/01/2016 01/03/2019 09/28/2019   Cholestrol 0 - 200 mg/dL - - - - 101   LDLCALC 0 - 99 mg/dL -  - - - 60   HDL >40 mg/dL - - - - 28(L)   Trlycerides <150 mg/dL - - - - 67   Hemoglobin A1c 4.8 - 5.6 % 10.8(H) 7.9(H) 8.4(H) 7.9(H) 8.4(H)   HCO3 20.0 - 28.0 mmol/L - - 32.4(H) 27.2 -   O2SAT % - - - 79.7 -       Pulmonary Assessment Scores:  Pulmonary Assessment Scores    Row Name 10/29/19 1130 01/14/20 1111       ADL UCSD   ADL Phase Entry Exit    SOB Score total 30 --    Rest 0 --    Walk 3 --    Stairs 2 --    Bath 0 --    Dress 0 --    Shop 1 --      CAT Score   CAT Score 7 --      mMRC Score   mMRC Score 3 3           UCSD: Self-administered rating of dyspnea associated with activities of daily living (ADLs) 6-point scale (0 = "not at all" to 5 = "maximal or unable to do because of breathlessness")  Scoring Scores range from 0 to 120.  Minimally important difference is 5 units  CAT: CAT can identify the health impairment of COPD patients and is better correlated with disease progression.  CAT has a scoring range of zero to 40. The CAT score is classified into four groups of low (less than 10), medium (10 - 20), high (21-30) and very high (31-40) based on the impact level of disease on health status. A CAT score over 10 suggests significant symptoms.  A worsening CAT score could be explained by an exacerbation, poor medication adherence, poor inhaler technique, or progression of COPD or comorbid conditions.  CAT MCID is 2 points  mMRC: mMRC (Modified Medical Research Council) Dyspnea Scale is used to assess the degree of baseline functional disability in patients of respiratory disease due to dyspnea. No minimal important difference is established. A decrease in score of 1 point or greater is considered a positive change.   Pulmonary Function Assessment:  Pulmonary Function Assessment - 10/16/19 0938      Breath   Shortness of Breath Yes;Limiting activity           Exercise Target Goals: Exercise Program Goal: Individual exercise prescription set  using results from initial 6 min walk test and THRR while considering  patient's activity barriers and safety.   Exercise  Prescription Goal: Initial exercise prescription builds to 30-45 minutes a day of aerobic activity, 2-3 days per week.  Home exercise guidelines will be given to patient during program as part of exercise prescription that the participant will acknowledge.  Education: Aerobic Exercise & Resistance Training: - Gives group verbal and written instruction on the various components of exercise. Focuses on aerobic and resistive training programs and the benefits of this training and how to safely progress through these programs..   Pulmonary Rehab from 01/28/2020 in [facility] Cardiac and Pulmonary Rehab  Date 01/07/20  Educator [name]  Instruction Review Code 1- Verbalizes Understanding  [8/25 resistance training]      Education: Exercise & Equipment Safety: - Individual verbal instruction and demonstration of equipment use and safety with use of the equipment.   Pulmonary Rehab from 01/28/2020 in [facility] Cardiac and Pulmonary Rehab  Date 10/16/19  Educator [facility]  Instruction Review Code 1- Verbalizes Understanding      Education: Exercise Physiology & General Exercise Guidelines: - Group verbal and written instruction with models to review the exercise physiology of the cardiovascular system and associated critical values. Provides general exercise guidelines with specific guidelines to those with heart or lung disease.    Pulmonary Rehab from 01/28/2020 in [facility] Cardiac and Pulmonary Rehab  Date 12/03/19  Educator [facility]  Instruction Review Code 1- Verbalizes Understanding      Education: Flexibility, Balance, Mind/Body Relaxation: Provides group verbal/written instruction on the benefits of flexibility and balance training, including mind/body exercise modes such as yoga, pilates and tai chi.  Demonstration and skill practice provided.   Pulmonary Rehab from 01/28/2020 in [facility] Cardiac and  Pulmonary Rehab  Date 01/21/20  Educator [facility]  Instruction Review Code 1- Verbalizes Understanding      Activity Barriers & Risk Stratification:  Activity Barriers & Cardiac Risk Stratification - 10/29/19 1121      Activity Barriers & Cardiac Risk Stratification   Activity Barriers Arthritis;Shortness of Breath;Balance Concerns;Deconditioning;Muscular Weakness           6 Minute Walk:  6 Minute Walk    Row Name 10/29/19 1103 01/14/20 1108       6 Minute Walk   Phase Initial Discharge    Distance 815 feet 995 feet    Distance % Change -- 22 %    Distance Feet Change -- 180 ft    Walk Time 5.88 minutes 6 minutes    # of Rest Breaks 1  7 sec 0    MPH 1.57 1.88    METS 1.5 1.8    RPE 15 13    Perceived Dyspnea  3 2    VO2 Peak 5.23 6.32    Symptoms Yes (comment) No    Comments calf pain 8/10 --    Resting HR 56 bpm 55 bpm    Resting BP 108/64 136/62    Resting Oxygen Saturation  96 % 97 %    Exercise Oxygen Saturation  during 6 min walk 88 % 95 %    Max Ex. HR 88 bpm 79 bpm    Max Ex. BP 134/56 148/58    2 Minute Post BP 128/70 --      Interval HR   1 Minute HR 64 65    2 Minute HR 66 63    3 Minute HR 72 63    4 Minute HR 81 70    5 Minute HR 72 62    6 Minute HR 88 79    2  Minute Post HR 67 --    Interval Heart Rate? Yes Yes      Interval Oxygen   Interval Oxygen? Yes Yes    Baseline Oxygen Saturation % 96 % 97 %    1 Minute Oxygen Saturation % 89 % 96 %    1 Minute Liters of Oxygen 2 L  continuous 2 L  continuous    2 Minute Oxygen Saturation % 88 % 96 %    2 Minute Liters of Oxygen 2 L 2 L    3 Minute Oxygen Saturation % 88 % 96 %    3 Minute Liters of Oxygen 2 L 2 L    4 Minute Oxygen Saturation % 89 % 96 %    4 Minute Liters of Oxygen 2 L 2 L    5 Minute Oxygen Saturation % 90 % 95 %    5 Minute Liters of Oxygen 0 L 2 L    6 Minute Oxygen Saturation % 89 % 95 %    6 Minute Liters of Oxygen 0 L 2 L    2 Minute Post Oxygen Saturation % 96 % --    2  Minute Post Liters of Oxygen 2 L --          Oxygen Initial Assessment:  Oxygen Initial Assessment - 10/16/19 0937      Home Oxygen   Home Oxygen Device Home Concentrator;Portable Concentrator;E-Tanks    Sleep Oxygen Prescription Continuous;CPAP    Liters per minute 2    Home Exercise Oxygen Prescription Continuous    Liters per minute 2    Home at Rest Exercise Oxygen Prescription None    Compliance with Home Oxygen Use Yes      Initial 6 min Walk   Oxygen Used Continuous    Liters per minute 2      Program Oxygen Prescription   Program Oxygen Prescription Continuous    Liters per minute 2      Intervention   Short Term Goals To learn and exhibit compliance with exercise, home and travel O2 prescription;To learn and understand importance of monitoring SPO2 with pulse oximeter and demonstrate accurate use of the pulse oximeter.;To learn and understand importance of maintaining oxygen saturations>88%;To learn and demonstrate proper pursed lip breathing techniques or other breathing techniques.;To learn and demonstrate proper use of respiratory medications    Long  Term Goals Exhibits compliance with exercise, home and travel O2 prescription;Verbalizes importance of monitoring SPO2 with pulse oximeter and return demonstration;Maintenance of O2 saturations>88%;Exhibits proper breathing techniques, such as pursed lip breathing or other method taught during program session;Compliance with respiratory medication;Demonstrates proper use of MDI's           Oxygen Re-Evaluation:  Oxygen Re-Evaluation    Row Name 11/03/19 1102 11/19/19 1014 12/19/19 1024 01/09/20 0959       Program Oxygen Prescription   Program Oxygen Prescription Continuous Continuous Continuous Continuous    Liters per minute 2 2 2 2       Home Oxygen   Home Oxygen Device Home Concentrator;Portable Concentrator;E-Tanks Home Concentrator;E-Tanks Home Concentrator;Portable Concentrator None    Sleep Oxygen  Prescription Continuous;CPAP Continuous;CPAP Continuous Continuous;CPAP    Liters per minute 2 2 2 2     Home Exercise Oxygen Prescription Continuous Continuous Continuous Continuous    Liters per minute 2 2 2 2     Home at Rest Exercise Oxygen Prescription None None None None    Liters per minute -- 0 0 0    Compliance  with Home Oxygen Use Yes Yes Yes Yes      Goals/Expected Outcomes   Short Term Goals To learn and exhibit compliance with exercise, home and travel O2 prescription;To learn and understand importance of monitoring SPO2 with pulse oximeter and demonstrate accurate use of the pulse oximeter.;To learn and understand importance of maintaining oxygen saturations>88%;To learn and demonstrate proper pursed lip breathing techniques or other breathing techniques.;To learn and demonstrate proper use of respiratory medications To learn and demonstrate proper use of respiratory medications;To learn and understand importance of monitoring SPO2 with pulse oximeter and demonstrate accurate use of the pulse oximeter.;To learn and exhibit compliance with exercise, home and travel O2 prescription;To learn and understand importance of maintaining oxygen saturations>88%;To learn and demonstrate proper pursed lip breathing techniques or other breathing techniques. To learn and exhibit compliance with exercise, home and travel O2 prescription;To learn and understand importance of monitoring SPO2 with pulse oximeter and demonstrate accurate use of the pulse oximeter.;To learn and understand importance of maintaining oxygen saturations>88%;To learn and demonstrate proper pursed lip breathing techniques or other breathing techniques.;To learn and demonstrate proper use of respiratory medications To learn and exhibit compliance with exercise, home and travel O2 prescription;To learn and understand importance of monitoring SPO2 with pulse oximeter and demonstrate accurate use of the pulse oximeter.;To learn and  understand importance of maintaining oxygen saturations>88%;To learn and demonstrate proper pursed lip breathing techniques or other breathing techniques.;To learn and demonstrate proper use of respiratory medications    Long  Term Goals Exhibits compliance with exercise, home and travel O2 prescription;Verbalizes importance of monitoring SPO2 with pulse oximeter and return demonstration;Maintenance of O2 saturations>88%;Exhibits proper breathing techniques, such as pursed lip breathing or other method taught during program session;Compliance with respiratory medication;Demonstrates proper use of MDI's Exhibits compliance with exercise, home and travel O2 prescription;Maintenance of O2 saturations>88%;Verbalizes importance of monitoring SPO2 with pulse oximeter and return demonstration;Exhibits proper breathing techniques, such as pursed lip breathing or other method taught during program session;Demonstrates proper use of MDI's;Compliance with respiratory medication Exhibits compliance with exercise, home and travel O2 prescription;Verbalizes importance of monitoring SPO2 with pulse oximeter and return demonstration;Maintenance of O2 saturations>88%;Exhibits proper breathing techniques, such as pursed lip breathing or other method taught during program session;Compliance with respiratory medication;Demonstrates proper use of MDI's Exhibits compliance with exercise, home and travel O2 prescription;Verbalizes importance of monitoring SPO2 with pulse oximeter and return demonstration;Maintenance of O2 saturations>88%;Exhibits proper breathing techniques, such as pursed lip breathing or other method taught during program session;Compliance with respiratory medication;Demonstrates proper use of MDI's    Comments Reviewed PLB technique with pt.  Talked about how it works and it's importance in maintaining their exercise saturations. When Ernest Mclean is resting at home he does not require oxygen. He has seen Dr. [name]  yesterday and he stated he does not need oxygen at rest. Ernest Mclean does not want to wear oxygen everytime he goes out but knows that he needs to wear 2 liters on exertion. Ernest Mclean has a new oxygen delivery machine. It can do pulsed and continuous oxygen. He is wearing his oxygen continuous 2 liters. His new concentrator is liter than a tank and has been doing well with is new machine. He feels like he is less short of breath and is able to do more. Ernest Mclean is compliant with his oxygen therapy.  He continues to keep a close eye on his oxygen saturations and they are doing well.    Goals/Expected Outcomes Short: Become more profiecient at using PLB.  Long: Become independent at using PLB. Short: use oxygen on exertion at all times. Long: maintain oxygen saturations at home at rest and exertion independently Short: get familiar with his new concentrator. Long: maintain use of concentrator independently. Continued compliance.           Oxygen Discharge (Final Oxygen Re-Evaluation):  Oxygen Re-Evaluation - 01/09/20 0959      Program Oxygen Prescription   Program Oxygen Prescription Continuous    Liters per minute 2      Home Oxygen   Home Oxygen Device None    Sleep Oxygen Prescription Continuous;CPAP    Liters per minute 2    Home Exercise Oxygen Prescription Continuous    Liters per minute 2    Home at Rest Exercise Oxygen Prescription None    Liters per minute 0    Compliance with Home Oxygen Use Yes      Goals/Expected Outcomes   Short Term Goals To learn and exhibit compliance with exercise, home and travel O2 prescription;To learn and understand importance of monitoring SPO2 with pulse oximeter and demonstrate accurate use of the pulse oximeter.;To learn and understand importance of maintaining oxygen saturations>88%;To learn and demonstrate proper pursed lip breathing techniques or other breathing techniques.;To learn and demonstrate proper use of respiratory medications    Long  Term Goals  Exhibits compliance with exercise, home and travel O2 prescription;Verbalizes importance of monitoring SPO2 with pulse oximeter and return demonstration;Maintenance of O2 saturations>88%;Exhibits proper breathing techniques, such as pursed lip breathing or other method taught during program session;Compliance with respiratory medication;Demonstrates proper use of MDI's    Comments Ernest Mclean is compliant with his oxygen therapy.  He continues to keep a close eye on his oxygen saturations and they are doing well.    Goals/Expected Outcomes Continued compliance.           Initial Exercise Prescription:  Initial Exercise Prescription - 10/29/19 1100      Date of Initial Exercise RX and Referring Provider   Date 10/29/19    Referring Provider Lujean Amel MD      Oxygen   Oxygen Continuous    Liters 2      Recumbant Bike   Level 1    RPM 50    Watts 16    Minutes 15    METs 1.5      NuStep   Level 1    SPM 80    Minutes 15    METs 1.5      Arm Ergometer   Level 1    Watts 23    RPM 25    Minutes 15    METs 1.5      Biostep-RELP   Level 1    SPM 50    Minutes 15    METs 1      Prescription Details   Frequency (times per week) 3    Duration Progress to 30 minutes of continuous aerobic without signs/symptoms of physical distress      Intensity   THRR 40-80% of Max Heartrate 91-126    Ratings of Perceived Exertion 11-13    Perceived Dyspnea 0-4      Progression   Progression Continue to progress workloads to maintain intensity without signs/symptoms of physical distress.      Resistance Training   Training Prescription Yes    Weight 3 lb    Reps 10-15           Perform Capillary Blood Glucose checks as needed.  Exercise  Prescription Changes:  Exercise Prescription Changes    Row Name 10/29/19 1100 11/06/19 1400 11/19/19 0900 12/03/19 1300 12/16/19 1500     Response to Exercise   Blood Pressure (Admit) 108/64 136/70 138/74 160/74 142/70   Blood  Pressure (Exercise) 134/56 140/70 152/64 142/60 132/60   Blood Pressure (Exit) 124/66 132/68 122/64 158/56 130/70   Heart Rate (Admit) 56 bpm 61 bpm 64 bpm 69 bpm 53 bpm   Heart Rate (Exercise) 81 bpm 66 bpm 72 bpm 82 bpm 60 bpm   Heart Rate (Exit) 61 bpm 61 bpm 68 bpm 68 bpm 60 bpm   Oxygen Saturation (Admit) 96 % 98 % 91 % 98 % 98 %   Oxygen Saturation (Exercise) 88 % 95 % 90 % 97 % 95 %   Oxygen Saturation (Exit) 97 % 98 % 97 % 98 % 97 %   Rating of Perceived Exertion (Exercise) 15 13 15 12 15    Perceived Dyspnea (Exercise) 3 3 3 2 2    Symptoms calf pain 8/10 none none none none   Comments walk test results -- -- -- --   Duration -- Progress to 30 minutes of  aerobic without signs/symptoms of physical distress Continue with 30 min of aerobic exercise without signs/symptoms of physical distress. Continue with 30 min of aerobic exercise without signs/symptoms of physical distress. Continue with 30 min of aerobic exercise without signs/symptoms of physical distress.   Intensity -- THRR unchanged THRR unchanged THRR unchanged THRR unchanged     Progression   Progression -- Continue to progress workloads to maintain intensity without signs/symptoms of physical distress. Continue to progress workloads to maintain intensity without signs/symptoms of physical distress. Continue to progress workloads to maintain intensity without signs/symptoms of physical distress. Continue to progress workloads to maintain intensity without signs/symptoms of physical distress.   Average METs -- 2.85 3 1.9 2.17     Resistance Training   Training Prescription -- Yes Yes Yes Yes   Weight -- 3 lb 3 lb 4 lb 4 lb   Reps -- 10-15 10-15 10-15 10-15     Interval Training   Interval Training -- -- No No No     Oxygen   Oxygen -- Continuous Continuous Continuous Continuous   Liters -- 2 2 2 2      Treadmill   MPH -- -- -- 1 1   Grade -- -- -- 0 0   Minutes -- -- -- 15 15   METs -- -- -- 1.77 1.77     Recumbant  Bike   Level -- 1 1 -- --   RPM -- 50 -- -- --   Watts -- 27 18 -- --   Minutes -- 15 15 -- --   METs -- 2.85 -- -- --     NuStep   Level -- -- 5 -- 5   Minutes -- -- 15 -- 15   METs -- -- 3.1 -- 2.6     Arm Ergometer   Level -- -- 1 -- --   Minutes -- -- 15 -- --     Recumbant Elliptical   Level -- -- -- -- 1   Minutes -- -- -- -- 15   METs -- -- -- -- 1.5     REL-XR   Level -- -- -- 1 --   Minutes -- -- -- 15 --   METs -- -- -- 2 --     Biostep-RELP   Level -- -- 3 -- 3  Minutes -- -- 15 -- 15   METs -- -- 3 -- 3   Row Name 12/31/19 1400 01/14/20 1400 01/27/20 1400         Response to Exercise   Blood Pressure (Admit) 142/76 140/84 134/64     Blood Pressure (Exercise) 140/72 146/68 124/64     Blood Pressure (Exit) 138/64 148/82 124/66     Heart Rate (Admit) 59 bpm 62 bpm 74 bpm     Heart Rate (Exercise) 89 bpm 63 bpm 81 bpm     Heart Rate (Exit) 63 bpm 60 bpm 85 bpm     Oxygen Saturation (Admit) 98 % 98 % 94 %     Oxygen Saturation (Exercise) 96 % 96 % 94 %     Oxygen Saturation (Exit) 98 % 98 % 98 %     Rating of Perceived Exertion (Exercise) 15 13 13      Perceived Dyspnea (Exercise) 2 2 2      Symptoms none none none     Duration Continue with 30 min of aerobic exercise without signs/symptoms of physical distress. Continue with 30 min of aerobic exercise without signs/symptoms of physical distress. Continue with 30 min of aerobic exercise without signs/symptoms of physical distress.     Intensity THRR unchanged THRR unchanged THRR unchanged       Progression   Progression Continue to progress workloads to maintain intensity without signs/symptoms of physical distress. Continue to progress workloads to maintain intensity without signs/symptoms of physical distress. Continue to progress workloads to maintain intensity without signs/symptoms of physical distress.     Average METs 3 2.55 2.2       Resistance Training   Training Prescription Yes Yes Yes      Weight 4 lb 6 lb 4 lb     Reps 10-15 10-15 10-15       Interval Training   Interval Training No No No       Oxygen   Oxygen Continuous Continuous Continuous     Liters 2 2 2        Treadmill   MPH -- -- 1     Grade -- -- 0     Minutes -- -- 15     METs -- -- 1.77       NuStep   Level 6 6 --     SPM 80 -- --     Minutes 15 15 --     METs 3 2.8 --       Arm Ergometer   Level -- 1 --     Minutes -- 15 --     METs -- 2.3 --       Recumbant Elliptical   Level -- 1 --     Minutes -- 15 --     METs -- 2.1 --       REL-XR   Level -- -- 5     Minutes -- -- 15     METs -- -- 2.5       Biostep-RELP   Level 6 6 --     SPM 50 -- --     Minutes 15 15 --     METs 3 3 --       Home Exercise Plan   Plans to continue exercise at -- Home (comment)  walking Home (comment)  walking     Frequency -- Add 2 additional days to program exercise sessions. Add 2 additional days to program exercise sessions.  Initial Home Exercises Provided -- 01/12/20 01/12/20            Exercise Comments:   Exercise Goals and Review:  Exercise Goals    Row Name 10/29/19 1125             Exercise Goals   Increase Physical Activity Yes       Intervention Provide advice, education, support and counseling about physical activity/exercise needs.;Develop an individualized exercise prescription for aerobic and resistive training based on initial evaluation findings, risk stratification, comorbidities and participant's personal goals.       Expected Outcomes Short Term: Attend rehab on a regular basis to increase amount of physical activity.;Long Term: Add in home exercise to make exercise part of routine and to increase amount of physical activity.;Long Term: Exercising regularly at least 3-5 days a week.       Increase Strength and Stamina Yes       Intervention Provide advice, education, support and counseling about physical activity/exercise needs.;Develop an individualized exercise  prescription for aerobic and resistive training based on initial evaluation findings, risk stratification, comorbidities and participant's personal goals.       Expected Outcomes Short Term: Increase workloads from initial exercise prescription for resistance, speed, and METs.;Short Term: Perform resistance training exercises routinely during rehab and add in resistance training at home;Long Term: Improve cardiorespiratory fitness, muscular endurance and strength as measured by increased METs and functional capacity (6MWT)       Able to understand and use rate of perceived exertion (RPE) scale Yes       Intervention Provide education and explanation on how to use RPE scale       Expected Outcomes Long Term:  Able to use RPE to guide intensity level when exercising independently;Short Term: Able to use RPE daily in rehab to express subjective intensity level       Able to understand and use Dyspnea scale Yes       Intervention Provide education and explanation on how to use Dyspnea scale       Expected Outcomes Short Term: Able to use Dyspnea scale daily in rehab to express subjective sense of shortness of breath during exertion;Long Term: Able to use Dyspnea scale to guide intensity level when exercising independently       Knowledge and understanding of Target Heart Rate Range (THRR) Yes       Intervention Provide education and explanation of THRR including how the numbers were predicted and where they are located for reference       Expected Outcomes Short Term: Able to state/look up THRR;Short Term: Able to use daily as guideline for intensity in rehab;Long Term: Able to use THRR to govern intensity when exercising independently       Able to check pulse independently Yes       Intervention Provide education and demonstration on how to check pulse in carotid and radial arteries.;Review the importance of being able to check your own pulse for safety during independent exercise       Expected Outcomes  Short Term: Able to explain why pulse checking is important during independent exercise;Long Term: Able to check pulse independently and accurately       Understanding of Exercise Prescription Yes       Intervention Provide education, explanation, and written materials on patient's individual exercise prescription       Expected Outcomes Short Term: Able to explain program exercise prescription;Long Term: Able to explain home exercise prescription to exercise independently  Improve claudication pain toleration; Improve walking ability Yes       Intervention Attend education sessions to aid in risk factor modification and understanding of disease process       Expected Outcomes Short Term: Improve walking distance/time to onset of claudication pain;Long Term: Improve walking ability and toleration to claudication              Exercise Goals Re-Evaluation :  Exercise Goals Re-Evaluation    Row Name 11/03/19 1101 11/06/19 1408 11/19/19 0957 11/19/19 1011 12/03/19 1316     Exercise Goal Re-Evaluation   Exercise Goals Review Increase Physical Activity;Increase Strength and Stamina;Able to understand and use Dyspnea scale;Able to check pulse independently;Understanding of Exercise Prescription;Knowledge and understanding of Target Heart Rate Range (THRR);Able to understand and use rate of perceived exertion (RPE) scale -- Increase Physical Activity;Increase Strength and Stamina;Understanding of Exercise Prescription Increase Physical Activity;Increase Strength and Stamina Increase Physical Activity;Increase Strength and Stamina   Comments Reviewed RPE and dyspnea scales, THR and program prescription with pt today.  Pt voiced understanding and was given a copy of goals to take home. Ernest Mclean is tolerating exercise well in his first week.  Staff will monitor progress. Ernest Mclean is doing well in rehab.  He tried to go without oxygen on Monday, but needed it once into full exertion.  He is up to level 5 on the  NuStep.  We will continue to monitor his progress. Ernest Mclean is doing weight at home but is still going through reovery from his pneumonia. He walks at home in his house at the moment. He is working hard in rehab and is willing to push himself while maintaining his shortness of breath. Ernest Mclean has tolerated exercise well.  He has increased weights for strength work to 4 lb.   Expected Outcomes Short: Use RPE daily to regulate intensity. Long: Follow program prescription in THR. Short: attend consistently Long: build overall stamina Short: Increase BioStep  Long; Continue to rebuild stamina Short: continue to exercise more frequently. Long: maintain a more vigarous exercise routine independently at home. Short: attend consistently Long:  build overall stamina   Row Name 12/16/19 1521 12/31/19 1410 01/09/20 0953 01/14/20 1412 01/27/20 1442     Exercise Goal Re-Evaluation   Exercise Goals Review Increase Physical Activity;Increase Strength and Stamina;Understanding of Exercise Prescription Increase Physical Activity;Increase Strength and Stamina;Understanding of Exercise Prescription Increase Physical Activity;Increase Strength and Stamina;Understanding of Exercise Prescription Increase Physical Activity;Increase Strength and Stamina;Understanding of Exercise Prescription Increase Physical Activity;Increase Strength and Stamina;Understanding of Exercise Prescription   Comments Ernest Mclean has been doing well in rehab.  He is up to level 5 on the NuStep.  We will continue to montior his progress. Ernest Mclean continues to progress well and is up to level 6 on NS and Biostep.  Staff will monitor progress. Ernest Mclean is nearing graduation.  Updated home exercise.  He is walking at home on his off days. Overall, he has made good progress and is impressed with being on level 5 for the recumbent ellipitcal. Ernest Mclean improved his post walk by 160f!! He will continue to walk on his off days and daily after graduation. Ernest Pickerelgraduates tomorrow!    Expected Outcomes Short: Continue to bump up workloads Long: Continue to builld stamina. Short: continue to exercise consistently Long:  improve overall stamina Short: Improve post 6MWT  Long: Continue to improve stamina. Short: Continue to work toward graduation Long: COntinue to exercise independently Maintain exercise gains after completing LW.  Discharge Exercise Prescription (Final Exercise Prescription Changes):  Exercise Prescription Changes - 01/27/20 1400      Response to Exercise   Blood Pressure (Admit) 134/64    Blood Pressure (Exercise) 124/64    Blood Pressure (Exit) 124/66    Heart Rate (Admit) 74 bpm    Heart Rate (Exercise) 81 bpm    Heart Rate (Exit) 85 bpm    Oxygen Saturation (Admit) 94 %    Oxygen Saturation (Exercise) 94 %    Oxygen Saturation (Exit) 98 %    Rating of Perceived Exertion (Exercise) 13    Perceived Dyspnea (Exercise) 2    Symptoms none    Duration Continue with 30 min of aerobic exercise without signs/symptoms of physical distress.    Intensity THRR unchanged      Progression   Progression Continue to progress workloads to maintain intensity without signs/symptoms of physical distress.    Average METs 2.2      Resistance Training   Training Prescription Yes    Weight 4 lb    Reps 10-15      Interval Training   Interval Training No      Oxygen   Oxygen Continuous    Liters 2      Treadmill   MPH 1    Grade 0    Minutes 15    METs 1.77      REL-XR   Level 5    Minutes 15    METs 2.5      Home Exercise Plan   Plans to continue exercise at Home (comment)   walking   Frequency Add 2 additional days to program exercise sessions.    Initial Home Exercises Provided 01/12/20           Nutrition:  Target Goals: Understanding of nutrition guidelines, daily intake of sodium <1537m, cholesterol <2068m calories 30% from fat and 7% or less from saturated fats, daily to have 5 or more servings of fruits and  vegetables.  Education: Controlling Sodium/Reading Food Labels -Group verbal and written material supporting the discussion of sodium use in heart healthy nutrition. Review and explanation with models, verbal and written materials for utilization of the food label.   [facility]  Date 07/10/18  [facility]- Verbalizes Understanding      Education: General Nutrition Guidelines/Fats and Fiber: -Group instruction provided by verbal, written material, models and posters to present the general guidelines for heart healthy nutrition. Gives an explanation and review of dietary fats and fiber.   [facility]  Date 01/28/20  [facility] Code 1- Verbalizes Understanding      Biometrics:  Pre Biometrics - 10/29/19 1126      Pre Biometrics   Height 6' 0.5" (1.842 m)    Weight 223 lb 8 oz (101.4 kg)    BMI (Calculated) 29.88    Single Leg Stand 0 seconds           Post Biometrics - 01/14/20 1110       Post  Biometrics   Height 6' 0.5" (1.842 m)    Weight 223 lb 1.6 oz (101.2 kg)    BMI (Calculated) 29.83           Nutrition Therapy Plan and Nutrition Goals:  Nutrition Therapy & Goals - 01/05/20 1010      Nutrition Therapy   Diet T2DM,  low Na, heart healthy    Fiber 30 grams    Whole Grain Foods 3 servings    Saturated Fats 12 max. grams    Fruits and Vegetables 5 servings/day    Sodium 1.5 grams      Personal Nutrition Goals   Nutrition Goal ST: continue heart healthy eating and including heart healthy snacks. LT: increase functional years, manage BG and CKD.    Comments Pt primary diagnosis for Pulmonary Rehab is CHF. Pt also presents with CAD, HTN, A-fib, COPD, GERD, T2DM (A1C 8.7), CKD stage 3, hyperparathyroidism, anxiety, and OSA on CPAP. 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 Pt also  presents with COPD exacerbation. Current Relevant Medications: Lipitor, pravachol, B12, vitamin D, vitamin C, lasix, lantus, humalog, metformin, lyrica, celexa, valum. Wife is a nutritionist who used to work at [employer]. 2012m Na. 4 meals/day - insulin shots and one snack. 5 CHOS at each meal B: oatmeal mixed with some fruit or oatmeal muffins. L: 3 oz meat (red meat and chicken- red meat 3x/month), vegetables (steamed) and fruit D: same as lunch (keeps foods varied especially fruits and vegetables) S: light snack Drinks: water or diet gingerale (small glass). Pt reports putting olive oil and vinegar on vegetables and nuts in oatmeal. discussed heart healthy snacking as pt reports being hungry sometimes during the day; sugar free jelly and peanut butter on wheat bread. Pt reports wife closely monitoring and measuring what he is eating. Discussed heart healthy and diabetes friendly eating.      Intervention Plan   Intervention Prescribe, educate and counsel regarding individualized specific dietary modifications aiming towards targeted core components such as weight, hypertension, lipid management, diabetes, heart failure and other comorbidities.    Expected Outcomes Short Term Goal: A plan has been developed with personal nutrition goals set during dietitian appointment.;Long Term Goal: Adherence to prescribed nutrition plan.;Short Term Goal: Understand basic principles of dietary content, such as calories, fat, sodium, cholesterol and nutrients.           Nutrition Assessments:  Nutrition Assessments - 10/29/19 1127      MEDFICTS Scores   Pre Score 42           MEDIFICTS Score Key:          ?70 Need to make dietary changes          40-70 Heart Healthy Diet         ? 40 Therapeutic Level Cholesterol Diet  Nutrition Goals Re-Evaluation:  Nutrition Goals Re-Evaluation    RLos OlivosName 11/19/19 1021 01/09/20 1000           Goals   Current Weight 225 lb (102.1 kg) --      Nutrition Goal  Lose weight --      Comment Ernest Mclean to lose weight to help with his breathing. He states that he loves food and over eats at times. LChetancontinues to work on weight loss.  He admits to cheating on occasion, but try to stick to healthy foods overall.      Expected Outcome Short: cut back on meal portions. Long: lose 10 pounds by the end of Rehab Continue to focus on healthy eating.             Nutrition Goals Discharge (Final Nutrition Goals Re-Evaluation):  Nutrition Goals Re-Evaluation - 01/09/20 1000      Goals   Comment Ernest Mclean to work on weight loss.  He admits to cheating  on occasion, but try to stick to healthy foods overall.    Expected Outcome Continue to focus on healthy eating.           Psychosocial: Target Goals: Acknowledge presence or absence of significant depression and/or stress, maximize coping skills, provide positive support system. Participant is able to verbalize types and ability to use techniques and skills needed for reducing stress and depression.   Education: Depression - Provides group verbal and written instruction on the correlation between heart/lung disease and depressed mood, treatment options, and the stigmas associated with seeking treatment.   Pulmonary Rehab from 01/28/2020 in [facility] Cardiac and Pulmonary Rehab  Date 12/24/19  Educator [facility]  Instruction Review Code 1- [employer] Understanding      Education: Sleep Hygiene -Provides group verbal and written instruction about how sleep can affect your health.  Define sleep hygiene, discuss sleep cycles and impact of sleep habits. Review good sleep hygiene tips.    Pulmonary Rehab from 07/10/2018 in [facility] Cardiac and Pulmonary Rehab  Date 05/29/18  Educator [facility]  Instruction Review Code 1- Verbalizes Understanding      Education: Stress and Anxiety: - Provides group verbal and written instruction about the health risks of elevated stress and causes of high stress.  Discuss the correlation  between heart/lung disease and anxiety and treatment options. Review healthy ways to manage with stress and anxiety.   Pulmonary Rehab from 07/10/2018 in [facility] Card[facility] and Pulmonary Rehab  Date 06/26/18  Educator [facility]  Instruction Review Code 1- Verbalizes Understanding      Initial Review & Psychosocial Screening:  Initial Psych Review & Screening - 10/16/19 0938      Initial Review   Current issues with Current Anxiety/Panic;History of Depression      Family Dynamics   Good Support System? Yes    Comments Judas has some anxiety and everything that he has been through with his healthcan make him feel that way. He has got over his fear of dying and is not depressed like he used to me.      Barriers   Psychosocial barriers to participate in program The patient should benefit from training in stress management and relaxation.      Screening Interventions   Interventions Encouraged to exercise;Provide feedback about the scores to participant;To provide support and resources with identified psychosocial needs    Expected Outcomes Short Term goal: Utilizing psychosocial counselor, staff and physician to assist with identification of specific Stressors or current issues interfering with healing process. Setting desired goal for each stressor or current issue identified.;Long Term Goal: Stressors or current issues are controlled or eliminated.;Short Term goal: Identification and review with participant of any Quality of Life or Depression concerns found by scoring the questionnaire.;Long Term goal: The participant improves quality of Life and PHQ9 Scores as seen by post scores and/or verbalization of changes           Quality of Life Scores:  Scores of 19 and below usually indicate a poorer quality of life in these areas.  A difference of  2-3 points is a clinically meaningful difference.  A difference of 2-3 points in the total score of the Quality of Life Index has been associated with  significant improvement in overall quality of life, self-image, physical symptoms, and general health in studies assessing change in quality of life.  PHQ-9: Recent Review Flowsheet Data    Depression screen [facility] 2/9 10/29/2019 04/22/2018 04/01/2018 07/16/2017 03/19/2017   Decreased Interest 1 0 0 0  0   Down, Depressed, Hopeless 1 0 0 0 0   PHQ - 2 Score 2 0 0 0 0   Altered sleeping 0 0 0 - -   Tired, decreased energy 1 0 1 - -   Change in appetite 0 0 0 - -   Feeling bad or failure about yourself  1 0 0 - -   Trouble concentrating 0 0 0 - -   Moving slowly or fidgety/restless 0 0 0 - -   Suicidal thoughts 0 0 0 - -   PHQ-9 Score 4 0 1 - -   Difficult doing work/chores Not difficult at all Not difficult at all Not difficult at all - -     Interpretation of Total Score  Total Score Depression Severity:  1-4 = Minimal depression, 5-9 = Mild depression, 10-14 = Moderate depression, 15-19 = Moderately severe depression, 20-27 = Severe depression   Psychosocial Evaluation and Intervention:  Psychosocial Evaluation - 10/16/19 0940      Psychosocial Evaluation & Interventions   Interventions Encouraged to exercise with the program and follow exercise prescription    Comments Ernest Mclean has some anxiety and everything that he has been through with his healthcan make him feel that way. He has got over his fear of dying and is not depressed like he used to me.    Expected Outcomes Short: Start [facility] to help with mood. Long: Maintain a healthy mental state.    Continue Psychosocial Services  Follow up required by staff           Psychosocial Re-Evaluation:  Psychosocial Re-Evaluation    Encinal Name 11/19/19 1020 12/19/19 1036 01/09/20 0955         Psychosocial Re-Evaluation   Current issues with None Identified None Identified Current Stress Concerns     Comments Patient reports no issues with their current mental states, sleep, stress, depression or anxiety. Will follow up with patient in a  few weeks for any changes. Patient reports no issues with their current mental states, sleep, stress, depression or anxiety. Will follow up with patient in a few weeks for any changes. He is alot happier now that he is able to be out of the house and interact with people at rehab. Ernest Mclean is doing the best he can mentally.  He is in Stage 3 CKD and end stage COPD.  He deals the best he can.  He sleeps well.  He enjoys coming to class.     Expected Outcomes Short: Continue to exercise regularly to support mental health and notify staff of any changes. Long: maintain mental health and well being through teaching of rehab or prescribed medications independently. Short: Continue to exercise regularly to support mental health and notify staff of any changes. Long: maintain mental health and well being through teaching of rehab or prescribed medications independently. Continue to focus on the positive     Interventions Encouraged to attend Pulmonary Rehabilitation for the exercise Encouraged to attend Pulmonary Rehabilitation for the exercise Encouraged to attend Pulmonary Rehabilitation for the exercise     Continue Psychosocial Services  Follow up required by staff Follow up required by staff --            Psychosocial Discharge (Final Psychosocial Re-Evaluation):  Psychosocial Re-Evaluation - 01/09/20 0955      Psychosocial Re-Evaluation   Current issues with Current Stress Concerns    Comments Ernest Mclean is doing the best he can mentally.  He is in Stage 3 CKD  and end stage COPD.  He deals the best he can.  He sleeps well.  He enjoys coming to class.    Expected Outcomes Continue to focus on the positive    Interventions Encouraged to attend Pulmonary Rehabilitation for the exercise           Education: Education Goals: Education classes will be provided on a weekly basis, covering required topics. Participant will state understanding/return demonstration of topics presented.  Learning  Barriers/Preferences:  Learning Barriers/Preferences - 10/16/19 0940      Learning Barriers/Preferences   Learning Barriers Sight    Learning Preferences None           General Pulmonary Education Topics:  Infection Prevention: - Provides verbal and written material to individual with discussion of infection control including proper hand washing and proper equipment cleaning during exercise session.   Pulmonary Rehab from 01/28/2020 in [facility] Cardiac and Pulmonary Rehab  Date 10/16/19  Educator [facility]  Instruction Review Code 1- Verbalizes Understanding      Falls Prevention: - Provides verbal and written material to individual with discussion of falls prevention and safety.   Pulmonary Rehab from 01/28/2020 in [facility] Cardiac and Pulmonary Rehab  Date 10/16/19  Educator [facility]  Instruction Review Code 1- Verbalizes Understanding      Chronic Lung Diseases: - Group verbal and written instruction to review updates, respiratory medications, advancements in procedures and treatments. Discuss use of supplemental oxygen including available portable oxygen systems, continuous and intermittent flow rates, concentrators, personal use and safety guidelines. Review proper use of inhaler and spacers. Provide informative websites for self-education.    Pulmonary Rehab from 01/28/2020 in [facility] Cardiac and Pulmonary Rehab  Date 12/10/19  Educator [facility]  Instruction Review Code 1- Verbalizes Understanding      Energy Conservation: - Provide group verbal and written instruction for methods to conserve energy, plan and organize activities. Instruct on pacing techniques, use of adaptive equipment and posture/positioning to relieve shortness of breath.   Triggers and Exacerbations: - Group verbal and written instruction to review types of environmental triggers and ways to prevent exacerbations. Discuss weather changes, air quality and the benefits of nasal washing. Review warning signs and symptoms to help  prevent infections. Discuss techniques for effective airway clearance, coughing, and vibrations.   Pulmonary Rehab from 07/10/2018 in [facility] Cardiac and Pulmonary Rehab  Date 05/10/18  Educator [facility]  Instruction Review Code 1- Verbalizes Understanding      AED/CPR: - Group verbal and written instruction with the use of models to demonstrate the basic use of the AED with the basic ABC's of resuscitation.   Anatomy and Physiology of the Lungs: - Group verbal and written instruction with the use of models to provide basic lung anatomy and physiology related to function, structure and complications of lung disease.   Pulmonary Rehab from 07/10/2018 in [facility] Cardiac and Pulmonary Rehab  Date 04/05/18  Educator [facility]  Instruction Review Code 1- Verbalizes Understanding      Anatomy & Physiology of the Heart: - Group verbal and written instruction and models provide basic cardiac anatomy and physiology, with the coronary electrical and arterial systems. Review of Valvular disease and Heart Failure   Pulmonary Rehab from 07/10/2018 in [facility] Card[facility] and Pulmonary Rehab  Date 05/24/18  Educator [facility] [facility]  Instruction Review Code 1- Verbalizes Understanding      Cardiac Medications: - Group verbal and written instruction to review commonly prescribed medications for heart disease. Reviews the medication, class of the drug, and  side effects.   Pulmonary Rehab from 07/10/2018 in [facility] Cardiac and Pulmonary Rehab  Date 05/31/18  Educator [facility]  Instruction Review Code 1- Verbalizes Understanding      Other: -Provides group and verbal instruction on various topics (see comments)   Knowledge Questionnaire Score:  Knowledge Questionnaire Score - 10/29/19 1128      Knowledge Questionnaire Score   Pre Score 17/18 Education Focus: O2 safety            Core Components/Risk Factors/Patient Goals at Admission:  Personal Goals and Risk Factors at Admission - 10/29/19 1128      Core Components/Risk  Factors/Patient Goals on Admission    Weight Management Yes;Weight Loss    Intervention Weight Management: Develop a combined nutrition and exercise program designed to reach desired caloric intake, while maintaining appropriate intake of nutrient and fiber, sodium and fats, and appropriate energy expenditure required for the weight goal.;Weight Management: Provide education and appropriate resources to help participant work on and attain dietary goals.    Admit Weight 223 lb 8 oz (101.4 kg)    Goal Weight: Short Term 218 lb (98.9 kg)    Goal Weight: Long Term 213 lb (96.6 kg)    Expected Outcomes Long Term: Adherence to nutrition and physical activity/exercise program aimed toward attainment of established weight goal;Short Term: Continue to assess and modify interventions until short term weight is achieved;Weight Loss: Understanding of general recommendations for a balanced deficit meal plan, which promotes 1-2 lb weight loss per week and includes a negative energy balance of [phone] kcal/d;Understanding recommendations for meals to include 15-35% energy as protein, 25-35% energy from fat, 35-60% energy from carbohydrates, less than 230m of dietary cholesterol, 20-35 gm of total fiber daily;Understanding of distribution of calorie intake throughout the day with the consumption of 4-5 meals/snacks    Improve shortness of breath with ADL's Yes    Intervention Provide education, individualized exercise plan and daily activity instruction to help decrease symptoms of SOB with activities of daily living.    Expected Outcomes Short Term: Improve cardiorespiratory fitness to achieve a reduction of symptoms when performing ADLs;Long Term: Be able to perform more ADLs without symptoms or delay the onset of symptoms    Diabetes Yes    Intervention Provide education about signs/symptoms and action to take for hypo/hyperglycemia.;Provide education about proper nutrition, including hydration, and  aerobic/resistive exercise prescription along with prescribed medications to achieve blood glucose in normal ranges: Fasting glucose 65-99 mg/dL    Expected Outcomes Short Term: Participant verbalizes understanding of the signs/symptoms and immediate care of hyper/hypoglycemia, proper foot care and importance of medication, aerobic/resistive exercise and nutrition plan for blood glucose control.;Long Term: Attainment of HbA1C < 7%.    Heart Failure Yes    Intervention Provide a combined exercise and nutrition program that is supplemented with education, support and counseling about heart failure. Directed toward relieving symptoms such as shortness of breath, decreased exercise tolerance, and extremity edema.    Expected Outcomes Improve functional capacity of life;Short term: Attendance in program 2-3 days a week with increased exercise capacity. Reported lower sodium intake. Reported increased fruit and vegetable intake. Reports medication compliance.;Short term: Daily weights obtained and reported for increase. Utilizing diuretic protocols set by physician.;Long term: Adoption of self-care skills and reduction of barriers for early signs and symptoms recognition and intervention leading to self-care maintenance.    Hypertension Yes    Intervention Provide education on lifestyle modifcations including regular physical activity/exercise, weight management, moderate sodium restriction  and increased consumption of fresh fruit, vegetables, and low fat dairy, alcohol moderation, and smoking cessation.;Monitor prescription use compliance.    Expected Outcomes Short Term: Continued assessment and intervention until BP is < 140/9m HG in hypertensive participants. < 130/866mHG in hypertensive participants with diabetes, heart failure or chronic kidney disease.;Long Term: Maintenance of blood pressure at goal levels.    Lipids Yes    Intervention Provide education and support for participant on nutrition &  aerobic/resistive exercise along with prescribed medications to achieve LDL <7046mHDL >2m76m  Expected Outcomes Short Term: Participant states understanding of desired cholesterol values and is compliant with medications prescribed. Participant is following exercise prescription and nutrition guidelines.;Long Term: Cholesterol controlled with medications as prescribed, with individualized exercise RX and with personalized nutrition plan. Value goals: LDL < 70mg26mL > 40 mg.           Education:Diabetes - Individual verbal and written instruction to review signs/symptoms of diabetes, desired ranges of glucose level fasting, after meals and with exercise. Acknowledge that pre and post exercise glucose checks will be done for 3 sessions at entry of program.   [facility]  Date 10/16/19  Educator [facility] Code 1- Verbalizes Understanding      Education: Know Your Numbers and Risk Factors: -Group verbal and written instruction about important numbers in your health.  Discussion of what are risk factors and how they play a role in the disease process.  Review of Cholesterol, Blood Pressure, Diabetes, and BMI and the role they play in your overall health.   [facility]  Date 12/31/19  [facility]- Verbal[facility] Understanding      Core Components/Risk Factors/Patient Goals Review:   Goals and Risk Factor Review    Row Name 11/19/19 1018 12/19/19 1029 01/09/20 0957         Core Components/Risk Factors/Patient Goals Review   Personal Goals Review Weight Management/Obesity;Heart Failure;Lipids;Hypertension;Diabetes;Improve shortness of breath with ADL's Weight Management/Obesity;Hypertension;Lipids;Improve shortness of breath with ADL's Weight Management/Obesity;Hypertension;Lipids;Improve shortness of breath with ADL's     Review Spoke to patient  about their shortness of breath and what they can do to improve. Patient has been informed of breathing techniques when starting the program. Patient is informed to tell staff if they have had any med changes and that certain meds they are taking or not taking can be causing shortness of breath. Ernest Mclean to lose more weight and has been trying his best not to eat as much. His blood pressure has been stable and he is trying to monitor it at home. He is able to do more at home and get around easier since he is able to exercise more at rehab. Ernest Mclean well in rehab.  His weight is yoyoing up and down 2-3 lb.  Blood pressures are doing well. His breathing is stable.     Expected Outcomes Short: Attend [facility] regularly to improve shortness of breath with ADL's. Long: maintain independence with ADL's Short: continue to exercise to lose weight. Long: maintain weight loss independently at home post [facility]. Continue to work on weight loss and monitoring symptoms.            Core Components/Risk Factors/Patient Goals at Discharge (Final Review):   Goals and Risk Factor Review - 01/09/20 0957      Core Components/Risk Factors/Patient Goals Review  Personal Goals Review Weight Management/Obesity;Hypertension;Lipids;Improve shortness of breath with ADL's    Review Makael is doing well in rehab.  His weight is yoyoing up and down 2-3 lb.  Blood pressures are doing well. His breathing is stable.    Expected Outcomes Continue to work on weight loss and monitoring symptoms.           ITP Comments:  ITP Comments    Row Name 10/16/19 0942 10/29/19 1102 11/03/19 1100 11/05/19 0636 12/03/19 1103   ITP Comments Virtual Visit completed. Patient informed on EP and RD appointment and 6 Minute walk test. Patient also informed of patient health questionnaires on My Chart. Patient Verbalizes understanding. Visit diagnosis can be found in [facility] 09/27/2019. Completed 6MWT and gym orientation.  Initial ITP created  and sent for review to Dr. Emily Filbert, Medical Director. First full day of exercise!  Patient was oriented to gym and equipment including functions, settings, policies, and procedures.  Patient's individual exercise prescription and treatment plan were reviewed.  All starting workloads were established based on the results of the 6 minute walk test done at initial orientation visit.  The plan for exercise progression was also introduced and progression will be customized based on patient's performance and goals. 30 Day review completed. Medical Director ITP review done, changes made as directed, and signed approval by Medical Director. 30 Day review completed. Medical Director ITP review done, changes made as directed, and signed approval by Medical Director.   [place] Name 12/31/19 0717 01/28/20 0949         ITP Comments 30 Day review completed. Medical Director ITP review done, changes made as directed, and signed approval by Medical Director. Darral graduated today from  rehab with 36 sessions completed.  Details of the patient's exercise prescription and what He needs to do in order to continue the prescription and progress were discussed with patient.  Patient was given a copy of prescription and goals.  Patient verbalized understanding.  Keawe plans to continue to exercise by walking.             Comments: discharge ITP

## 2020-01-30 ENCOUNTER — Ambulatory Visit: Payer: Medicare PPO

## 2020-02-02 ENCOUNTER — Ambulatory Visit: Payer: Medicare PPO

## 2020-02-04 ENCOUNTER — Ambulatory Visit: Payer: Medicare PPO

## 2020-02-06 ENCOUNTER — Ambulatory Visit: Payer: Medicare PPO

## 2020-02-09 ENCOUNTER — Ambulatory Visit: Payer: Medicare PPO

## 2020-02-11 ENCOUNTER — Ambulatory Visit: Payer: Medicare PPO

## 2020-02-13 ENCOUNTER — Ambulatory Visit: Payer: Medicare PPO

## 2020-02-16 ENCOUNTER — Ambulatory Visit: Payer: Medicare PPO

## 2020-04-18 ENCOUNTER — Other Ambulatory Visit: Payer: Self-pay | Admitting: Internal Medicine

## 2020-05-01 ENCOUNTER — Emergency Department
Admit: 2020-05-01 | Discharge: 2020-05-01 | Disposition: A | Discharge status: Home / Self Care | Payer: Medicare PPO | Attending: Family Medicine | Admitting: Family Medicine

## 2020-05-01 ENCOUNTER — Inpatient Hospital Stay
Admission: EM | Admit: 2020-05-01 | Discharge: 2020-05-03 | DRG: 291 | Disposition: A | Discharge status: Home Health | Payer: Medicare PPO | Attending: Family Medicine | Admitting: Family Medicine

## 2020-05-01 ENCOUNTER — Other Ambulatory Visit: Payer: Self-pay

## 2020-05-01 ENCOUNTER — Encounter: Payer: Self-pay | Admitting: Emergency Medicine

## 2020-05-01 ENCOUNTER — Emergency Department: Payer: Medicare PPO

## 2020-05-01 DIAGNOSIS — Z800 Family history of malignant neoplasm of digestive organs: Secondary | ICD-10-CM

## 2020-05-01 DIAGNOSIS — I480 Paroxysmal atrial fibrillation: Secondary | ICD-10-CM | POA: Diagnosis present

## 2020-05-01 DIAGNOSIS — F419 Anxiety disorder, unspecified: Secondary | ICD-10-CM | POA: Diagnosis present

## 2020-05-01 DIAGNOSIS — Z20822 Contact with and (suspected) exposure to covid-19: Secondary | ICD-10-CM | POA: Diagnosis present

## 2020-05-01 DIAGNOSIS — I252 Old myocardial infarction: Secondary | ICD-10-CM

## 2020-05-01 DIAGNOSIS — E785 Hyperlipidemia, unspecified: Secondary | ICD-10-CM | POA: Diagnosis present

## 2020-05-01 DIAGNOSIS — I2510 Atherosclerotic heart disease of native coronary artery without angina pectoris: Secondary | ICD-10-CM | POA: Diagnosis present

## 2020-05-01 DIAGNOSIS — N183 Chronic kidney disease, stage 3 unspecified: Secondary | ICD-10-CM | POA: Diagnosis present

## 2020-05-01 DIAGNOSIS — I130 Hypertensive heart and chronic kidney disease with heart failure and stage 1 through stage 4 chronic kidney disease, or unspecified chronic kidney disease: Principal | ICD-10-CM | POA: Diagnosis present

## 2020-05-01 DIAGNOSIS — Z803 Family history of malignant neoplasm of breast: Secondary | ICD-10-CM

## 2020-05-01 DIAGNOSIS — I509 Heart failure, unspecified: Secondary | ICD-10-CM

## 2020-05-01 DIAGNOSIS — Z888 Allergy status to other drugs, medicaments and biological substances status: Secondary | ICD-10-CM

## 2020-05-01 DIAGNOSIS — E1122 Type 2 diabetes mellitus with diabetic chronic kidney disease: Secondary | ICD-10-CM | POA: Diagnosis present

## 2020-05-01 DIAGNOSIS — K219 Gastro-esophageal reflux disease without esophagitis: Secondary | ICD-10-CM | POA: Diagnosis present

## 2020-05-01 DIAGNOSIS — Z951 Presence of aortocoronary bypass graft: Secondary | ICD-10-CM

## 2020-05-01 DIAGNOSIS — J9621 Acute and chronic respiratory failure with hypoxia: Secondary | ICD-10-CM | POA: Diagnosis present

## 2020-05-01 DIAGNOSIS — I4892 Unspecified atrial flutter: Secondary | ICD-10-CM | POA: Diagnosis present

## 2020-05-01 DIAGNOSIS — I482 Chronic atrial fibrillation, unspecified: Secondary | ICD-10-CM

## 2020-05-01 DIAGNOSIS — I5043 Acute on chronic combined systolic (congestive) and diastolic (congestive) heart failure: Secondary | ICD-10-CM | POA: Diagnosis not present

## 2020-05-01 DIAGNOSIS — Z833 Family history of diabetes mellitus: Secondary | ICD-10-CM

## 2020-05-01 DIAGNOSIS — J9811 Atelectasis: Secondary | ICD-10-CM | POA: Diagnosis present

## 2020-05-01 DIAGNOSIS — Z9111 Patient's noncompliance with dietary regimen: Secondary | ICD-10-CM

## 2020-05-01 DIAGNOSIS — Z85828 Personal history of other malignant neoplasm of skin: Secondary | ICD-10-CM

## 2020-05-01 DIAGNOSIS — E1165 Type 2 diabetes mellitus with hyperglycemia: Secondary | ICD-10-CM | POA: Diagnosis present

## 2020-05-01 DIAGNOSIS — G4733 Obstructive sleep apnea (adult) (pediatric): Secondary | ICD-10-CM | POA: Diagnosis present

## 2020-05-01 DIAGNOSIS — N179 Acute kidney failure, unspecified: Secondary | ICD-10-CM | POA: Diagnosis present

## 2020-05-01 DIAGNOSIS — Z6830 Body mass index (BMI) 30.0-30.9, adult: Secondary | ICD-10-CM | POA: Diagnosis not present

## 2020-05-01 DIAGNOSIS — Z7901 Long term (current) use of anticoagulants: Secondary | ICD-10-CM

## 2020-05-01 DIAGNOSIS — I5033 Acute on chronic diastolic (congestive) heart failure: Secondary | ICD-10-CM | POA: Diagnosis present

## 2020-05-01 DIAGNOSIS — J440 Chronic obstructive pulmonary disease with acute lower respiratory infection: Secondary | ICD-10-CM | POA: Diagnosis present

## 2020-05-01 DIAGNOSIS — J189 Pneumonia, unspecified organism: Secondary | ICD-10-CM | POA: Diagnosis present

## 2020-05-01 DIAGNOSIS — N1832 Chronic kidney disease, stage 3b: Secondary | ICD-10-CM | POA: Diagnosis present

## 2020-05-01 DIAGNOSIS — I248 Other forms of acute ischemic heart disease: Secondary | ICD-10-CM | POA: Diagnosis present

## 2020-05-01 DIAGNOSIS — I10 Essential (primary) hypertension: Secondary | ICD-10-CM | POA: Diagnosis present

## 2020-05-01 DIAGNOSIS — J441 Chronic obstructive pulmonary disease with (acute) exacerbation: Secondary | ICD-10-CM | POA: Diagnosis present

## 2020-05-01 DIAGNOSIS — Z79899 Other long term (current) drug therapy: Secondary | ICD-10-CM

## 2020-05-01 DIAGNOSIS — Z885 Allergy status to narcotic agent status: Secondary | ICD-10-CM

## 2020-05-01 DIAGNOSIS — Z794 Long term (current) use of insulin: Secondary | ICD-10-CM

## 2020-05-01 DIAGNOSIS — Z880 Allergy status to penicillin: Secondary | ICD-10-CM

## 2020-05-01 DIAGNOSIS — E1129 Type 2 diabetes mellitus with other diabetic kidney complication: Secondary | ICD-10-CM | POA: Diagnosis present

## 2020-05-01 DIAGNOSIS — E669 Obesity, unspecified: Secondary | ICD-10-CM | POA: Diagnosis present

## 2020-05-01 DIAGNOSIS — Z823 Family history of stroke: Secondary | ICD-10-CM

## 2020-05-01 DIAGNOSIS — Z87891 Personal history of nicotine dependence: Secondary | ICD-10-CM

## 2020-05-01 DIAGNOSIS — Z7982 Long term (current) use of aspirin: Secondary | ICD-10-CM

## 2020-05-01 LAB — RESP PANEL BY RT-PCR (FLU A&B, COVID) ARPGX2
Influenza A by PCR: NEGATIVE
Influenza B by PCR: NEGATIVE
SARS Coronavirus 2 by RT PCR: NEGATIVE

## 2020-05-01 LAB — CBC
HCT: 35.8 % — ABNORMAL LOW (ref 39.0–52.0)
Hemoglobin: 11.1 g/dL — ABNORMAL LOW (ref 13.0–17.0)
MCH: 27.1 pg (ref 26.0–34.0)
MCHC: 31 g/dL (ref 30.0–36.0)
MCV: 87.3 fL (ref 80.0–100.0)
Platelets: 174 10*3/uL (ref 150–400)
RBC: 4.1 MIL/uL — ABNORMAL LOW (ref 4.22–5.81)
RDW: 17.2 % — ABNORMAL HIGH (ref 11.5–15.5)
WBC: 10.1 10*3/uL (ref 4.0–10.5)
nRBC: 0 % (ref 0.0–0.2)

## 2020-05-01 LAB — BASIC METABOLIC PANEL
Anion gap: 9 (ref 5–15)
BUN: 36 mg/dL — ABNORMAL HIGH (ref 8–23)
CO2: 27 mmol/L (ref 22–32)
Calcium: 8.8 mg/dL — ABNORMAL LOW (ref 8.9–10.3)
Chloride: 105 mmol/L (ref 98–111)
Creatinine, Ser: 1.73 mg/dL — ABNORMAL HIGH (ref 0.61–1.24)
GFR, Estimated: 40 mL/min — ABNORMAL LOW (ref >60)
Glucose, Bld: 252 mg/dL — ABNORMAL HIGH (ref 70–99)
Potassium: 3.8 mmol/L (ref 3.5–5.1)
Sodium: 141 mmol/L (ref 135–145)

## 2020-05-01 LAB — ECHOCARDIOGRAM COMPLETE
AR max vel: 2.63 cm2
AV Peak grad: 6 mmHg
Ao pk vel: 1.22 m/s
Area-P 1/2: 4.54 cm2
Height: 72.5 in
S' Lateral: 3.1 cm
Weight: 3569.69 oz

## 2020-05-01 LAB — CBG MONITORING, ED
Glucose-Capillary: 284 mg/dL — ABNORMAL HIGH (ref 70–99)
Glucose-Capillary: 478 mg/dL — ABNORMAL HIGH (ref 70–99)

## 2020-05-01 LAB — PROCALCITONIN: Procalcitonin: <0.1 ng/mL

## 2020-05-01 LAB — TROPONIN I (HIGH SENSITIVITY)
Troponin I (High Sensitivity): 186 ng/L (ref <18)
Troponin I (High Sensitivity): 190 ng/L (ref <18)

## 2020-05-01 LAB — BRAIN NATRIURETIC PEPTIDE: B Natriuretic Peptide: 358.1 pg/mL — ABNORMAL HIGH (ref 0.0–100.0)

## 2020-05-01 LAB — LACTIC ACID, PLASMA: Lactic Acid, Venous: 1.1 mmol/L (ref 0.5–1.9)

## 2020-05-01 MED ORDER — ACETAMINOPHEN 325 MG PO TABS: 650.0000 mg | ORAL_TABLET | ORAL | Status: DC | PRN

## 2020-05-01 MED ORDER — SODIUM CHLORIDE 0.9 % IV SOLN
500.0000 mg | INTRAVENOUS | Status: DC
  Administered 2020-05-01 – 2020-05-02 (×2): 500 mg via INTRAVENOUS
  Filled 2020-05-01 (×3): qty 500

## 2020-05-01 MED ORDER — AMLODIPINE BESYLATE 5 MG PO TABS
5.0000 mg | ORAL_TABLET | Freq: Every day | ORAL | Status: DC
  Administered 2020-05-02 – 2020-05-03 (×2): 5 mg via ORAL
  Filled 2020-05-01 (×2): qty 1

## 2020-05-01 MED ORDER — ALBUTEROL SULFATE HFA 108 (90 BASE) MCG/ACT IN AERS
1.0000 | INHALATION_SPRAY | Freq: Four times a day (QID) | RESPIRATORY_TRACT | Status: DC | PRN
  Filled 2020-05-01: qty 6.7

## 2020-05-01 MED ORDER — SODIUM CHLORIDE 0.9% FLUSH
3.0000 mL | Freq: Two times a day (BID) | INTRAVENOUS | Status: DC
  Administered 2020-05-01 – 2020-05-02 (×4): 3 mL via INTRAVENOUS

## 2020-05-01 MED ORDER — ALBUTEROL SULFATE (2.5 MG/3ML) 0.083% IN NEBU: 2.5000 mg | INHALATION_SOLUTION | RESPIRATORY_TRACT | Status: DC | PRN

## 2020-05-01 MED ORDER — METHYLPREDNISOLONE SODIUM SUCC 125 MG IJ SOLR
125.0000 mg | Freq: Once | INTRAMUSCULAR | Status: AC
  Administered 2020-05-01: 14:00:00 125 mg via INTRAVENOUS
  Filled 2020-05-01: qty 2

## 2020-05-01 MED ORDER — DIAZEPAM 5 MG PO TABS
5.0000 mg | ORAL_TABLET | Freq: Two times a day (BID) | ORAL | Status: DC | PRN
  Administered 2020-05-02 (×2): 5 mg via ORAL
  Filled 2020-05-01 (×2): qty 1

## 2020-05-01 MED ORDER — PREGABALIN 25 MG PO CAPS
25.0000 mg | ORAL_CAPSULE | Freq: Two times a day (BID) | ORAL | Status: DC
  Administered 2020-05-01 – 2020-05-03 (×4): 25 mg via ORAL
  Filled 2020-05-01 (×4): qty 1

## 2020-05-01 MED ORDER — APIXABAN 2.5 MG PO TABS
2.5000 mg | ORAL_TABLET | Freq: Two times a day (BID) | ORAL | Status: DC
  Administered 2020-05-02 – 2020-05-03 (×3): 2.5 mg via ORAL
  Filled 2020-05-01 (×5): qty 1

## 2020-05-01 MED ORDER — PANTOPRAZOLE SODIUM 40 MG PO TBEC
40.0000 mg | DELAYED_RELEASE_TABLET | Freq: Every day | ORAL | Status: DC
  Administered 2020-05-02 – 2020-05-03 (×2): 40 mg via ORAL
  Filled 2020-05-01 (×2): qty 1

## 2020-05-01 MED ORDER — ASPIRIN 81 MG PO CHEW
324.0000 mg | CHEWABLE_TABLET | Freq: Once | ORAL | Status: AC
  Administered 2020-05-01: 14:00:00 324 mg via ORAL
  Filled 2020-05-01: qty 4

## 2020-05-01 MED ORDER — ONDANSETRON HCL 4 MG/2ML IJ SOLN: 4.0000 mg | Freq: Four times a day (QID) | INTRAMUSCULAR | Status: DC | PRN

## 2020-05-01 MED ORDER — LISINOPRIL 5 MG PO TABS
5.0000 mg | ORAL_TABLET | Freq: Every day | ORAL | Status: DC
  Administered 2020-05-01 – 2020-05-02 (×2): 5 mg via ORAL
  Filled 2020-05-01 (×2): qty 1

## 2020-05-01 MED ORDER — IPRATROPIUM BROMIDE 0.02 % IN SOLN
0.5000 mg | Freq: Once | RESPIRATORY_TRACT | Status: AC
  Administered 2020-05-01: 14:00:00 0.5 mg via RESPIRATORY_TRACT
  Filled 2020-05-01: qty 2.5

## 2020-05-01 MED ORDER — ENOXAPARIN SODIUM 40 MG/0.4ML ~~LOC~~ SOLN: 40.0000 mg | SUBCUTANEOUS | Status: DC

## 2020-05-01 MED ORDER — METOPROLOL TARTRATE 50 MG PO TABS
50.0000 mg | ORAL_TABLET | Freq: Two times a day (BID) | ORAL | Status: DC
  Administered 2020-05-01 – 2020-05-03 (×4): 50 mg via ORAL
  Filled 2020-05-01 (×4): qty 1

## 2020-05-01 MED ORDER — VITAMIN B-12 1000 MCG PO TABS
500.0000 ug | ORAL_TABLET | Freq: Every day | ORAL | Status: DC
  Administered 2020-05-02 – 2020-05-03 (×2): 500 ug via ORAL
  Filled 2020-05-01 (×2): qty 1

## 2020-05-01 MED ORDER — SODIUM CHLORIDE 0.9% FLUSH: 3.0000 mL | INTRAVENOUS | Status: DC | PRN

## 2020-05-01 MED ORDER — SODIUM CHLORIDE 0.9 % IV SOLN: 250.0000 mL | INTRAVENOUS | Status: DC | PRN

## 2020-05-01 MED ORDER — PREDNISONE 20 MG PO TABS: 40.0000 mg | ORAL_TABLET | Freq: Every day | ORAL | Status: DC

## 2020-05-01 MED ORDER — TIOTROPIUM BROMIDE MONOHYDRATE 18 MCG IN CAPS
18.0000 ug | ORAL_CAPSULE | Freq: Every day | RESPIRATORY_TRACT | Status: DC
  Administered 2020-05-02: 10:00:00 18 ug via RESPIRATORY_TRACT
  Filled 2020-05-01: qty 5

## 2020-05-01 MED ORDER — METHYLPREDNISOLONE SODIUM SUCC 125 MG IJ SOLR
60.0000 mg | Freq: Three times a day (TID) | INTRAMUSCULAR | Status: AC
  Administered 2020-05-01 – 2020-05-02 (×3): 60 mg via INTRAVENOUS
  Filled 2020-05-01 (×3): qty 2

## 2020-05-01 MED ORDER — ATORVASTATIN CALCIUM 20 MG PO TABS
20.0000 mg | ORAL_TABLET | Freq: Every day | ORAL | Status: DC
  Administered 2020-05-02 – 2020-05-03 (×2): 20 mg via ORAL
  Filled 2020-05-01 (×2): qty 1

## 2020-05-01 MED ORDER — INSULIN ASPART 100 UNIT/ML ~~LOC~~ SOLN
0.0000 [IU] | Freq: Three times a day (TID) | SUBCUTANEOUS | Status: DC
  Administered 2020-05-01: 19:00:00 5 [IU] via SUBCUTANEOUS
  Administered 2020-05-02: 10:00:00 9 [IU] via SUBCUTANEOUS
  Filled 2020-05-01 (×2): qty 1

## 2020-05-01 MED ORDER — MOMETASONE FURO-FORMOTEROL FUM 200-5 MCG/ACT IN AERO
2.0000 | INHALATION_SPRAY | Freq: Two times a day (BID) | RESPIRATORY_TRACT | Status: DC
  Administered 2020-05-02 (×2): 2 via RESPIRATORY_TRACT
  Filled 2020-05-01: qty 8.8

## 2020-05-01 MED ORDER — ASPIRIN EC 81 MG PO TBEC
81.0000 mg | DELAYED_RELEASE_TABLET | Freq: Every day | ORAL | Status: DC
  Administered 2020-05-02 – 2020-05-03 (×2): 81 mg via ORAL
  Filled 2020-05-01 (×2): qty 1

## 2020-05-01 MED ORDER — PERFLUTREN LIPID MICROSPHERE
1.0000 mL | INTRAVENOUS | Status: AC | PRN
  Administered 2020-05-01: 17:00:00 3.5 mL via INTRAVENOUS
  Filled 2020-05-01: qty 10

## 2020-05-01 MED ORDER — FUROSEMIDE 10 MG/ML IJ SOLN
40.0000 mg | Freq: Every day | INTRAMUSCULAR | Status: DC
  Filled 2020-05-01: qty 4

## 2020-05-01 MED ORDER — ALBUTEROL SULFATE (2.5 MG/3ML) 0.083% IN NEBU
10.0000 mg | INHALATION_SOLUTION | Freq: Once | RESPIRATORY_TRACT | Status: AC
  Administered 2020-05-01: 15:00:00 10 mg via RESPIRATORY_TRACT
  Filled 2020-05-01: qty 12

## 2020-05-01 MED ORDER — CITALOPRAM HYDROBROMIDE 20 MG PO TABS
20.0000 mg | ORAL_TABLET | Freq: Every day | ORAL | Status: DC
  Administered 2020-05-01 – 2020-05-02 (×2): 20 mg via ORAL
  Filled 2020-05-01 (×2): qty 1

## 2020-05-01 NOTE — ED Notes (Signed)
Unable to administer PO medications due to not being verified by pharmacy. Per pharmacy, will verify and retime when staff available.

## 2020-05-01 NOTE — ED Notes (Signed)
Critical troponin called in by lab and given to Dr. Corky Downs

## 2020-05-01 NOTE — ED Notes (Addendum)
Patient transfered to  C pod

## 2020-05-01 NOTE — ED Notes (Signed)
Floor NP ouma contacted about pt's cbg being [phone]. Awaiting insulin orders at this time.

## 2020-05-01 NOTE — ED Triage Notes (Signed)
Arrives stating oxygen sats dropped to 70's over night.  Wears home oxygen, 2L/ [place], and CPAP at night.  Also states  Blood pressure was elevated.  Also states yesterday had a fever of 101  STates feels fine right now.  Wearing 2l/ [place].

## 2020-05-01 NOTE — Progress Notes (Signed)
*  PRELIMINARY RESULTS* Echocardiogram 2D Echocardiogram has been performed. Definity IV Contrast used on this study.  Ernest Mclean 05/01/2020, 4:55 PM

## 2020-05-01 NOTE — ED Notes (Signed)
Pharmacy called to verify medications.

## 2020-05-01 NOTE — ED Provider Notes (Signed)
[facility] Emergency Department Provider Note ____________________________________________   Event Date/Time   First MD Initiated Contact with Patient 05/01/20 1307     (approximate)  I have reviewed the triage vital signs and the nursing notes.  HISTORY  Chief Complaint No chief complaint on file.   HPI Ernest Mclean is a 79 y.o. malewho presents to the ED for evaluation of shortness of breath.  Chart review indicates history of A. fib on Eliquis, DM on insulin, GERD, HTN, HLD COPD on 2 L nasal cannula.  Diastolic CHF, follows with [facility] clinic cardiology Dr. Clayborn Bigness.  Obesity and OSA.  CAD s/p CABG Patient is fully vaccinated for COVID-19 with 3 doses of mRNA vaccine.  Patient reports 1 day of subjective dyspnea, dyspnea on exertion as well as documented fever at home up to 101 F.  He denies any symptoms beyond shortness of breath.  He denies pain to his chest, flank or abdomen, increased sputum production, abdominal pain, emesis, diarrhea.  Patient reports the fever was last night and he has had no fevers today.  He reports his finger pulse oximeter at home was demonstrating sats in the 70s-80s yesterday and this morning despite his typical 2 L O2.   Past Medical History:  Diagnosis Date  . Atrial fibrillation ([place])   . Basal cell carcinoma 06/2017   Left Ear  . CAD (coronary artery disease)   . CKD (chronic kidney disease)   . Congestive heart failure ([place])   . COPD (chronic obstructive pulmonary disease) ([place])   . Diabetes ([place])   . Hypertension   . OSA on CPAP   . 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 Atrial fibrillation, chronic ([place]) 09/27/2019  . CAP (community acquired pneumonia) 09/27/2019  . Acute on chronic respiratory failure with hypoxia ([place]) 09/27/2019  . Elevated troponin 09/27/2019  . Acute metabolic encephalopathy 27/51/7001  . Pneumonia 01/03/2019  .  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 Atherosclerosis of native arteries of extremity with intermittent claudication ([place]) 05/08/2017  . Atherosclerosis of native arteries of the extremities with ulceration ([place]) 03/20/2017  . CKD (chronic kidney disease), stage IIIa 03/20/2017  . Chronic diastolic CHF (congestive heart failure) ([place]) 08/01/2016  . Anxiety 02/22/2016  . Chronic respiratory failure ([place]) 08/10/2015  . Hypoglycemia 05/12/2015  . Type II diabetes mellitus with renal manifestations ([place]) 05/12/2015  . HTN (hypertension) 05/12/2015  . CAD (coronary artery disease) 05/12/2015  . Atrial flutter ([place]) 05/12/2015  . GERD (gastroesophageal reflux disease) 05/12/2015  . Solitary pulmonary nodule 02/10/2015  . OSA on CPAP 08/17/2014  . COPD (chronic obstructive pulmonary disease) ([place]) 08/17/2014    Past Surgical History:  Procedure Laterality Date  . CLAVICLE SURGERY    . heart bypass    . HERNIA REPAIR    . MOHS SURGERY  07/10/2017   Head    Prior to Admission medications   Medication Sig Start Date End Date Taking? Authorizing Provider  albuterol (VENTOLIN HFA) 108 (90 Base) MCG/ACT inhaler INHALE 2 PUFFS INTO THE LUNGS EVERY 4 HOURS AS NEEDED FOR WHEEZING OR SHORTNESS OF BREATH 04/18/20   Flora Lipps, MD  amLODipine (NORVASC) 5 MG tablet Take 5 mg by mouth daily.  08/17/14   [provider]  apixaban (ELIQUIS) 5 MG TABS tablet Take 2.5 mg by mouth 2 (two) times daily.     [provider]  aspirin  EC 81 MG tablet Take 81 mg by mouth daily. 08/17/14   [provider]  atorvastatin (LIPITOR) 20 MG tablet Take by mouth. 10/08/19   [provider]  BD INSULIN SYRINGE U/F 31G X 5/16" 0.5 ML MISC  10/10/19   [provider]  citalopram (CELEXA) 20 MG tablet Take 20 mg by mouth at bedtime.  07/30/14   [provider]  CRANBERRY PO Take 820 mg by mouth daily.    [provider]  cyanocobalamin 500 MCG tablet Take  500 mcg by mouth daily.    [provider]  diazepam (VALIUM) 5 MG tablet Take 5 mg by mouth every 12 (twelve) hours as needed for anxiety.  10/24/13   [provider]  ergocalciferol (VITAMIN D2) 50000 units capsule Take 50,000 Units by mouth every 30 (thirty) days.    [provider]  Fluticasone-Salmeterol (WIXELA INHUB) 500-50 MCG/DOSE AEPB Inhale 1 puff into the lungs 2 (two) times daily. 01/12/20   Flora Lipps, MD  furosemide (LASIX) 20 MG tablet Take 20 mg by mouth daily.     [provider]  HUMALOG 100 UNIT/ML injection Inject 10-25 Units into the skin 3 (three) times daily with meals. (sliding scale as needed to a maximum of 150u daily) 06/24/19   [provider]  insulin glargine (LANTUS) 100 UNIT/ML injection Inject 60 Units into the skin at bedtime.     [provider]  lisinopril (PRINIVIL,ZESTRIL) 5 MG tablet Take 5 mg by mouth daily.    [provider]  metFORMIN (GLUCOPHAGE-XR) 500 MG 24 hr tablet Take 1,000 mg by mouth daily with supper.    [provider]  metoprolol tartrate (LOPRESSOR) 100 MG tablet Take 0.5 tablets (50 mg total) by mouth 2 (two) times daily. This is a decrease from your previous 100 mg twice a day. 10/02/19   Enzo Bi, MD  OMEGA-3 FATTY ACIDS-VITAMIN E PO Take 1 capsule by mouth daily.    [provider]  omeprazole (PRILOSEC) 20 MG capsule Take 20 mg by mouth daily.    [provider]  pravastatin (PRAVACHOL) 20 MG tablet Take 20 mg by mouth every evening.    [provider]  pregabalin (LYRICA) 25 MG capsule Take 25 mg by mouth 2 (two) times daily.    [provider]  tiotropium (SPIRIVA) 18 MCG inhalation capsule Place 18 mcg into inhaler and inhale daily. 07/31/14   [provider]  vitamin C (ASCORBIC ACID) 500 MG tablet Take 500 mg by mouth daily.    [provider]    Allergies Codeine, Hydralazine, Hydrocodone-acetaminophen, and  Penicillins  Family History  Problem Relation Age of Onset  . Stroke Other   . Diabetes Other   . Breast cancer Other   . Colon cancer Other     Social History Social History   Tobacco Use  . Smoking status: Former Smoker    Packs/day: 1.00    Years: 30.00    Pack years: 30.00    Types: Cigarettes    Quit date: 12/14/2001    Years since quitting: 18.3  . Smokeless tobacco: Never Used  . Tobacco comment: quit 12/14/2001  Vaping Use  . Vaping Use: Never used  Substance Use Topics  . Alcohol use: No    Alcohol/week: 0.0 standard drinks  . Drug use: No    Review of Systems  Constitutional: Positive for fever/chills Eyes: No visual changes. ENT: No sore throat. Cardiovascular: Denies chest pain. Respiratory: Positive  for shortness of breath. Gastrointestinal: No abdominal pain.  No nausea, no vomiting.  No diarrhea.  No constipation. Genitourinary: Negative for dysuria. Musculoskeletal: Negative for back pain. Skin: Negative for rash. Neurological: Negative for headaches, focal weakness or numbness  ____________________________________________   PHYSICAL EXAM:  VITAL SIGNS: Vitals:   05/01/20 1016 05/01/20 1400  BP: (!) 162/44 (!) 173/66  Pulse: 63 (!) 56  Resp: 16 19  Temp: 98.6 F (37 C)   SpO2: 94% 98%    Constitutional: Alert and oriented. Well appearing and in no acute distress.  Sitting up in bed and conversational in full sentences. Eyes: Conjunctivae are normal. PERRL. EOMI. Head: Atraumatic. Nose: No congestion/rhinnorhea. Mouth/Throat: Mucous membranes are moist.  Oropharynx non-erythematous. Neck: No stridor. No cervical spine tenderness to palpation. Cardiovascular: Normal rate, regular rhythm. Grossly normal heart sounds.  Good peripheral circulation. Respiratory:  No retractions. Lungs CTAB.  Mild tachypnea to the low 20s, no further evidence of distress.  Moderate air movement throughout with diffuse expiratory wheezes and bibasilar  crackles. Gastrointestinal: Soft , nondistended, nontender to palpation. No CVA tenderness. Musculoskeletal: No lower extremity tenderness.  No joint effusions. No signs of acute trauma. Pitting edema to bilateral ankles symmetrically without overlying skin changes Neurologic:  Normal speech and language. No gross focal neurologic deficits are appreciated. No gait instability noted. Skin:  Skin is warm, dry and intact. No rash noted. Psychiatric: Mood and affect are normal. Speech and behavior are normal. ____________________________________________   LABS (all labs ordered are listed, but only abnormal results are displayed)  Labs Reviewed  BASIC METABOLIC PANEL - Abnormal; Notable for the following components:      Result Value   Glucose, Bld 252 (*)    BUN 36 (*)    Creatinine, Ser 1.73 (*)    Calcium 8.8 (*)    GFR, Estimated 40 (*)    All other components within normal limits  CBC - Abnormal; Notable for the following components:   RBC 4.10 (*)    Hemoglobin 11.1 (*)    HCT 35.8 (*)    RDW 17.2 (*)    All other components within normal limits  BRAIN NATRIURETIC PEPTIDE - Abnormal; Notable for the following components:   B Natriuretic Peptide 358.1 (*)    All other components within normal limits  TROPONIN I (HIGH SENSITIVITY) - Abnormal; Notable for the following components:   Troponin I (High Sensitivity) 186 (*)    All other components within normal limits  TROPONIN I (HIGH SENSITIVITY) - Abnormal; Notable for the following components:   Troponin I (High Sensitivity) 190 (*)    All other components within normal limits  RESP PANEL BY RT-PCR (FLU A&B, COVID) ARPGX2  LACTIC ACID, PLASMA  HIV ANTIBODY (ROUTINE TESTING W REFLEX)  CBG MONITORING, ED   ____________________________________________  12 Lead EKG  Atrial flutter with variable block, rate of 68 bpm, leftward axis.  Incomplete right bundle.  No evidence of acute  ischemia. ____________________________________________  RADIOLOGY  ED MD interpretation: 2 view CXR reviewed by me with symmetric bibasilar opacities  Official radiology report(s): DG Chest 2 View  Result Date: 05/01/2020 CLINICAL DATA:  Shortness of breath, O2 requirement, hypertension and diabetes EXAM: CHEST - 2 VIEW COMPARISON:  09/27/2019 FINDINGS: Stable cardiomegaly and previous median sternotomy for coronary bypass. Similar central vascular congestion. Increased bibasilar ill-defined bronchovascular opacities over the hemidiaphragms compatible with atelectasis or bibasilar bronchopneumonia. No superimposed edema pattern, significant effusion or pneumothorax. Trachea midline. Aorta atherosclerotic. Degenerative changes of the  spine. Remote right rib fractures. IMPRESSION: Increased bibasilar atelectasis versus developing pneumonia. Stable cardiomegaly and postoperative findings. Aortic Atherosclerosis (ICD10-I70.0). Electronically Signed   By: Jerilynn Mages.  Shick M.D.   On: 05/01/2020 11:16    ____________________________________________   PROCEDURES and INTERVENTIONS  Procedure(s) performed (including Critical Care):  .1-3 Lead EKG Interpretation Performed by: Vladimir Crofts, MD Authorized by: Vladimir Crofts, MD     Interpretation: normal     ECG rate:  70   ECG rate assessment: normal     Rhythm: atrial flutter     Ectopy: none     Conduction: normal      Medications  aspirin EC tablet 81 mg (has no administration in time range)  amLODipine (NORVASC) tablet 5 mg (has no administration in time range)  atorvastatin (LIPITOR) tablet 20 mg (has no administration in time range)  metoprolol tartrate (LOPRESSOR) tablet 50 mg (has no administration in time range)  citalopram (CELEXA) tablet 20 mg (has no administration in time range)  diazepam (VALIUM) tablet 5 mg (has no administration in time range)  pantoprazole (PROTONIX) EC tablet 40 mg (has no administration in time range)   apixaban (ELIQUIS) tablet 2.5 mg (has no administration in time range)  vitamin B-12 (CYANOCOBALAMIN) tablet 500 mcg (has no administration in time range)  tiotropium (SPIRIVA) inhalation capsule ([facility] use ONLY) 18 mcg (has no administration in time range)  mometasone-formoterol (DULERA) 200-5 MCG/ACT inhaler 2 puff (has no administration in time range)  albuterol (VENTOLIN HFA) 108 (90 Base) MCG/ACT inhaler 1 puff (has no administration in time range)  pregabalin (LYRICA) capsule 25 mg (has no administration in time range)  furosemide (LASIX) injection 40 mg (has no administration in time range)  methylPREDNISolone sodium succinate (SOLU-MEDROL) 125 mg/2 mL injection 60 mg (has no administration in time range)  albuterol (PROVENTIL) (2.5 MG/3ML) 0.083% nebulizer solution 2.5 mg (has no administration in time range)  albuterol (PROVENTIL) (2.5 MG/3ML) 0.083% nebulizer solution 10 mg (10 mg Nebulization Given 05/01/20 1456)  ipratropium (ATROVENT) nebulizer solution 0.5 mg (0.5 mg Nebulization Given 05/01/20 1401)  methylPREDNISolone sodium succinate (SOLU-MEDROL) 125 mg/2 mL injection 125 mg (125 mg Intravenous Given 05/01/20 1359)  aspirin chewable tablet 324 mg (324 mg Oral Given 05/01/20 1403)    ____________________________________________   MDM / ED COURSE   79 year old male presents to the ED from home with isolated shortness of breath, with evidence of a COPD and CHF exacerbation necessitating medical admission.  Patient with appropriate oxygen saturations on his home O2 of 2 L, but appears short of breath with mild tachypnea and conversational dyspnea.  Auscultation reveals wheezing, for which he received albuterol, Atrovent and steroids.  Elevated troponin noted, patient has no chest pain and his EKG shows no ischemic features to suggest STEMI.  Considering his CKD and slight decrement in his renal function today, this is likely secondary to his respiratory status.  Patient received  aspirin for this, but not heparin.  No evidence of sepsis here.  Covid is negative.  We will admit to hospitalist for further work-up and management of his respiratory illness.      ____________________________________________   FINAL CLINICAL IMPRESSION(S) / ED DIAGNOSES  Final diagnoses:  COPD exacerbation ([place])  Acute on chronic congestive heart failure, unspecified heart failure type [facility])     ED Discharge Orders    None       Kaiel Weide   Note:  This document was prepared using Dragon voice recognition software and may include unintentional dictation errors.  Vladimir Crofts, MD 05/01/20 [phone]

## 2020-05-01 NOTE — H&P (Addendum)
History and Physical    [name]:[id number] DOB: 08/19/1940 DOA: 05/01/2020  PCP: Juluis Pitch, MD   Patient coming from: Home  I have personally briefly reviewed patient's old medical records in [facility].  Chief Complaint: Shortness of breath  HPI: Ernest Mclean is a 79 y.o. male with medical history significant of diastolic CHF, A. fib on Eliquis, diabetes mellitus, GERD, hypertension, CKD stage IIIa , COPD on 2 L supplemental oxygen at baseline,  obesity, obstructive sleep apnea on CPAP,  CAD s/p CABG.  He follows with Dr. Clayborn Bigness in [place] clinic presents in the emergency department with one day history of worsening shortness of breath.  Patient usually uses 2 L of supplemental oxygen at baseline and uses CPAP at night.  Wife reports patient had a fever yesterday 101.36F and was given 2 extra strength Tylenol,  fever resolved . He reports worsening shortness of breath today morning. He denied any chest pain, palpitation, dizziness,  flank pain,  abdominal pain, nausea ,vomiting, diarrhea.  His O2 saturation yesterday at home was between 70 to 80%.  ED Course: He was found to be short of breath.  He denies any chest pain, palpitations, dizziness. He is afebrile, hypertensive ,  heart rate 56, blood pressure 173/ 66. He is 98% on 4 L/min. Labs include sodium 141, potassium 3.8, chloride 105, bicarb 27, BUN 36, creatinine 1.73, glucose 252, calcium 8. BNP 358, troponin I86, lactic acid 1.1, WBC 10.1, hemoglobin 11.1, hematocrit 35.8, platelet 174, influenza negative, Covid negative, RSV negative.  Chest x-ray: Increased bibasilar atelectasis versus developing pneumonia  Review of Systems: As per HPI otherwise 10 point review of systems negative.  Review of Systems  Constitutional: Positive for fever.  HENT: Negative.   Eyes: Negative.   Respiratory: Positive for shortness of breath.   Cardiovascular: Negative.   Gastrointestinal: Negative.   Genitourinary: Negative.    Musculoskeletal: Negative.   Skin: Negative.   Neurological: Negative.   Psychiatric/Behavioral: Negative.      Past Medical History:  Diagnosis Date  . Atrial fibrillation ([place])   . Basal cell carcinoma 06/2017   Left Ear  . CAD (coronary artery disease)   . CKD (chronic kidney disease)   . Congestive heart failure ([place])   . COPD (chronic obstructive pulmonary disease) ([place])   . Diabetes ([place])   . Hypertension   . OSA on CPAP   . 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 CLAVICLE SURGERY    . heart bypass    . HERNIA REPAIR    . MOHS SURGERY  07/10/2017   Head     reports that he quit smoking about 18 years ago. His smoking use included cigarettes. He has a 30.00 pack-year smoking history. He has never used smokeless tobacco. He reports that he does not drink alcohol and does not use drugs.  Allergies  Allergen Reactions  . Codeine Hives, Rash and Swelling  . Hydralazine Other (See Comments)    tongue swollen and couldn't wake up ---- not positive it was this or a mix of this with something else or high sugar  . Hydrocodone-Acetaminophen Other (See Comments)  . Penicillins Other (See Comments)    Passed out (at 79 yrs old) Has patient had a PCN reaction causing immediate rash, facial/tongue/throat swelling, SOB or lightheadedness with hypotension: No Has patient had a PCN reaction causing severe rash involving mucus membranes or skin  necrosis: No Has patient had a PCN reaction that required hospitalization No Has patient had a PCN reaction occurring within the last 10 years: No If all of the above answers are "NO", then may proceed with Cephalosporin use.     Family History  Problem Relation Age of Onset  . Stroke Other   . Diabetes Other   . Breast cancer Other   . Colon cancer Other      Family history reviewed and not pertinent .  Prior to Admission medications   Medication Sig Start Date End  Date Taking? Authorizing Provider  albuterol (VENTOLIN HFA) 108 (90 Base) MCG/ACT inhaler INHALE 2 PUFFS INTO THE LUNGS EVERY 4 HOURS AS NEEDED FOR WHEEZING OR SHORTNESS OF BREATH 04/18/20   Flora Lipps, MD  amLODipine (NORVASC) 5 MG tablet Take 5 mg by mouth daily.  08/17/14   [provider]  apixaban (ELIQUIS) 5 MG TABS tablet Take 2.5 mg by mouth 2 (two) times daily.     [provider]  aspirin EC 81 MG tablet Take 81 mg by mouth daily. 08/17/14   [provider]  atorvastatin (LIPITOR) 20 MG tablet Take by mouth. 10/08/19   [provider]  BD INSULIN SYRINGE U/F 31G X 5/16" 0.5 ML MISC  10/10/19   [provider]  citalopram (CELEXA) 20 MG tablet Take 20 mg by mouth at bedtime.  07/30/14   [provider]  CRANBERRY PO Take 820 mg by mouth daily.    [provider]  cyanocobalamin 500 MCG tablet Take 500 mcg by mouth daily.    [provider]  diazepam (VALIUM) 5 MG tablet Take 5 mg by mouth every 12 (twelve) hours as needed for anxiety.  10/24/13   [provider]  ergocalciferol (VITAMIN D2) 50000 units capsule Take 50,000 Units by mouth every 30 (thirty) days.    [provider]  Fluticasone-Salmeterol (WIXELA INHUB) 500-50 MCG/DOSE AEPB Inhale 1 puff into the lungs 2 (two) times daily. 01/12/20   Flora Lipps, MD  furosemide (LASIX) 20 MG tablet Take 20 mg by mouth daily.     [provider]  HUMALOG 100 UNIT/ML injection Inject 10-25 Units into the skin 3 (three) times daily with meals. (sliding scale as needed to a maximum of 150u daily) 06/24/19   [provider]  insulin glargine (LANTUS) 100 UNIT/ML injection Inject 60 Units into the skin at bedtime.     [provider]  lisinopril (PRINIVIL,ZESTRIL) 5 MG tablet Take 5 mg by mouth daily.    [provider]  metFORMIN (GLUCOPHAGE-XR) 500 MG 24 hr tablet Take 1,000 mg by mouth daily with supper.    [provider]  metoprolol tartrate (LOPRESSOR) 100 MG tablet Take 0.5 tablets (50 mg total) by mouth 2 (two) times daily. This is a decrease from your previous 100 mg twice a day. 10/02/19   Enzo Bi, MD  OMEGA-3 FATTY ACIDS-VITAMIN E PO Take 1 capsule by mouth daily.    [provider]  omeprazole (PRILOSEC) 20 MG capsule Take 20 mg by mouth daily.    [provider]  pravastatin (PRAVACHOL) 20 MG tablet Take 20 mg by mouth every evening.    [provider]  pregabalin (LYRICA) 25 MG capsule Take 25 mg by mouth 2 (two) times daily.    [provider]  tiotropium (SPIRIVA) 18 MCG inhalation capsule Place 18 mcg into inhaler and inhale daily. 07/31/14   [provider]  vitamin  C (ASCORBIC ACID) 500 MG tablet Take 500 mg by mouth daily.    [provider]    Physical Exam: Vitals:   05/01/20 1013 05/01/20 1016 05/01/20 1400  BP:  (!) 162/44 (!) 173/66  Pulse:  63 (!) 56  Resp:  16 19  Temp:  98.6 F (37 C)   TempSrc:  Oral   SpO2:  94% 98%  Weight: 101.2 kg    Height: 6' 0.5" (1.842 m)      Constitutional: NAD, calm, comfortable Vitals:   05/01/20 1013 05/01/20 1016 05/01/20 1400  BP:  (!) 162/44 (!) 173/66  Pulse:  63 (!) 56  Resp:  16 19  Temp:  98.6 F (37 C)   TempSrc:  Oral   SpO2:  94% 98%  Weight: 101.2 kg    Height: 6' 0.5" (1.842 m)     Eyes: PERRL, lids and conjunctivae normal ENMT: Mucous membranes are moist. Posterior pharynx clear of any exudate or lesions.Normal dentition.  Neck: normal, supple, no masses, no thyromegaly Respiratory: Wheezing to auscultation bilaterally, no crackles. Normal respiratory effort. No accessory muscle use.  Cardiovascular: Irregular rate and rhythm, no murmurs / rubs / gallops. 2+ pedal pulses. No carotid bruits.  Abdomen: no tenderness, no masses palpated. No hepatosplenomegaly. Bowel sounds positive.  Musculoskeletal: no clubbing / cyanosis. No joint deformity upper and lower extremities.  Good ROM, no contractures. Normal muscle tone.  Skin: no rashes, lesions, ulcers. No induration Neurologic: CN 2-12 grossly intact. Sensation intact, DTR normal. Strength 5/5 in all 4.  Psychiatric: Normal judgment and insight. Alert and oriented x 3. Normal mood.  Extremities: 2+ pedal edema.   Labs on Admission: I have personally reviewed following labs and imaging studies  CBC: Recent Labs  Lab 05/01/20 1023  WBC 10.1  HGB 11.1*  HCT 35.8*  MCV 87.3  PLT 628   Basic Metabolic Panel: Recent Labs  Lab 05/01/20 1023  NA 141  K 3.8  CL 105  CO2 27  GLUCOSE 252*  BUN 36*  CREATININE 1.73*  CALCIUM 8.8*   GFR: Estimated Creatinine Clearance: 44.4 mL/min (A) (by C-G formula based on SCr of 1.73 mg/dL (H)). Liver Function Tests: No results for input(s): AST, ALT, ALKPHOS, BILITOT, PROT, ALBUMIN in the last 168 hours. No results for input(s): LIPASE, AMYLASE in the last 168 hours. No results for input(s): AMMONIA in the last 168 hours. Coagulation Profile: No results for input(s): INR, PROTIME in the last 168 hours. Cardiac Enzymes: No results for input(s): CKTOTAL, CKMB, CKMBINDEX, TROPONINI in the last 168 hours. BNP (last 3 results) No results for input(s): PROBNP in the last 8760 hours. HbA1C: No results for input(s): HGBA1C in the last 72 hours. CBG: No results for input(s): GLUCAP in the last 168 hours. Lipid Profile: No results for input(s): CHOL, HDL, LDLCALC, TRIG, CHOLHDL, LDLDIRECT in the last 72 hours. Thyroid Function Tests: No results for input(s): TSH, T4TOTAL, FREET4, T3FREE, THYROIDAB in the last 72 hours. Anemia Panel: No results for input(s): VITAMINB12, FOLATE, FERRITIN, TIBC, IRON, RETICCTPCT in the last 72 hours. Urine analysis:    Component Value Date/Time   COLORURINE YELLOW (A) 07/09/2015 1532   APPEARANCEUR CLEAR (A) 07/09/2015 1532   LABSPEC 1.009 07/09/2015 1532   PHURINE 5.0 07/09/2015 1532   GLUCOSEU 150 (A) 07/09/2015 1532   HGBUR  NEGATIVE 07/09/2015 1532   BILIRUBINUR NEGATIVE 07/09/2015 1532   KETONESUR NEGATIVE 07/09/2015 1532   PROTEINUR 30 (A) 07/09/2015 1532   NITRITE NEGATIVE 07/09/2015  [place] 3+ (A) 07/09/2015 1532    Radiological Exams on Admission: DG Chest 2 View  Result Date: 05/01/2020 CLINICAL DATA:  Shortness of breath, O2 requirement, hypertension and diabetes EXAM: CHEST - 2 VIEW COMPARISON:  09/27/2019 FINDINGS: Stable cardiomegaly and previous median sternotomy for coronary bypass. Similar central vascular congestion. Increased bibasilar ill-defined bronchovascular opacities over the hemidiaphragms compatible with atelectasis or bibasilar bronchopneumonia. No superimposed edema pattern, significant effusion or pneumothorax. Trachea midline. Aorta atherosclerotic. Degenerative changes of the spine. Remote right rib fractures. IMPRESSION: Increased bibasilar atelectasis versus developing pneumonia. Stable cardiomegaly and postoperative findings. Aortic Atherosclerosis (ICD10-I70.0). Electronically Signed   By: Jerilynn Mages.  Shick M.D.   On: 05/01/2020 11:16    EKG: Independently reviewed.  Normal sinus rhythm no ST-T wave changes.  Assessment/Plan Principal Problem:   CHF exacerbation (HCC) Active Problems:   OSA on CPAP   COPD (chronic obstructive pulmonary disease) (HCC)   Type II diabetes mellitus with renal manifestations (HCC)   HTN (hypertension)   CAD (coronary artery disease)   Atrial flutter (HCC)   GERD (gastroesophageal reflux disease)   Chronic respiratory failure (HCC)   Anxiety   Chronic diastolic CHF (congestive heart failure) (HCC)   CKD (chronic kidney disease), stage IIIa   Acute on chronic hypoxic respiratory failure could be secondary to COPD and CHF exacerbation; Patient was hypoxic on arrival, O2 saturation 86 on 2 L increased to 4 L. Continue supplemental oxygen to keep saturation above 94%. Admitted to progressive. BNP 358.1, troponin I86 -190,  continue to  trend troponin. EKG no ST-T wave changes. Chest x-ray shows atelectasis or developing pneumonia Patient denies any chest pain, palpitations and dizziness. Patient is found to have wheezing on examination. Patient reports feeling after getting Solu-Medrol and albuterol treatment. Continue Solu-Medrol 60 mg every 8 hours, transition to prednisone in 1-2 days. Continue DuoNeb every 6 hours as needed. Patient takes Lasix 20 mg p.o. daily, has not taken Lasix in few days. Start Lasix 40 mg IV daily. Continue intake/ output monitoring Daily weight. Cardiology consulted, awaiting recommendation. Obtain 2D echocardiogram. Obtain procalcitonin. Given fever yesterday we will start Zithromax 500 mg daily. There is no signs of sepsis.  Lactic acid 1.1. De-escalate antibiotic based on procalcitonin levels.  Elevated troponin could be secondary to demand ischemia: Troponin 186-190, continue to trend troponins. Cardiology consulted, awaiting recommendation. EKG:  no ST-T wave changes. Continue cardioprotective medications aspirin, Lipitor.  Hypertension : Continue amlodipine, metoprolol.  Atrial fibrillation: Heart rate under control, Continue metoprolol and Eliquis.  AKI secondary to CKD 3A: Serum creatinine remains at baseline. Avoid nephrotoxic medication  recheck labs tomorrow morning.  COPD exacerbation:  Continue Solu-Medrol milligrams every 8 hours Continue DuoNeb every 4 hours as needed. Chest x-ray shows developing pneumonia, procalcitonin pending Consider Zithromax 500 mg IV daily.  Obstructive sleep apnea: Continue CPAP at night.   Diabetes mellitus: Regular insulin sliding scale. Obtain hemoglobin A1c.  Chronic anxiety: Continue Valium, Celexa.   DVT prophylaxis: Eliquis Code Status: Full code Family Communication: Wife is at bedside Disposition Plan: Home with home services versus SNF. Consults called: Cardiology Dr. Saralyn Pilar Admission status:  inpatient   Shawna Clamp MD [facility]  If 7PM-7AM, please contact night-coverage [email or web address]   05/01/2020, 4:02 PM

## 2020-05-01 NOTE — ED Notes (Signed)
Pt assisted to bathroom at this time. Pt able to ambulate independently to bathroom.

## 2020-05-02 DIAGNOSIS — I5043 Acute on chronic combined systolic (congestive) and diastolic (congestive) heart failure: Secondary | ICD-10-CM

## 2020-05-02 LAB — CBC
HCT: 35.3 % — ABNORMAL LOW (ref 39.0–52.0)
Hemoglobin: 11.5 g/dL — ABNORMAL LOW (ref 13.0–17.0)
MCH: 27.9 pg (ref 26.0–34.0)
MCHC: 32.6 g/dL (ref 30.0–36.0)
MCV: 85.7 fL (ref 80.0–100.0)
Platelets: 179 10*3/uL (ref 150–400)
RBC: 4.12 MIL/uL — ABNORMAL LOW (ref 4.22–5.81)
RDW: 16.9 % — ABNORMAL HIGH (ref 11.5–15.5)
WBC: 9.6 10*3/uL (ref 4.0–10.5)
nRBC: 0 % (ref 0.0–0.2)

## 2020-05-02 LAB — GLUCOSE, CAPILLARY
Glucose-Capillary: 528 mg/dL (ref 70–99)
Glucose-Capillary: 583 mg/dL (ref 70–99)
Glucose-Capillary: >600 mg/dL (ref 70–99)

## 2020-05-02 LAB — COMPREHENSIVE METABOLIC PANEL
ALT: 15 U/L (ref 0–44)
AST: 18 U/L (ref 15–41)
Albumin: 3.2 g/dL — ABNORMAL LOW (ref 3.5–5.0)
Alkaline Phosphatase: 111 U/L (ref 38–126)
Anion gap: 10 (ref 5–15)
BUN: 42 mg/dL — ABNORMAL HIGH (ref 8–23)
CO2: 26 mmol/L (ref 22–32)
Calcium: 8.7 mg/dL — ABNORMAL LOW (ref 8.9–10.3)
Chloride: 99 mmol/L (ref 98–111)
Creatinine, Ser: 1.84 mg/dL — ABNORMAL HIGH (ref 0.61–1.24)
GFR, Estimated: 37 mL/min — ABNORMAL LOW (ref >60)
Glucose, Bld: 556 mg/dL (ref 70–99)
Potassium: 4.3 mmol/L (ref 3.5–5.1)
Sodium: 135 mmol/L (ref 135–145)
Total Bilirubin: 1.2 mg/dL (ref 0.3–1.2)
Total Protein: 7.1 g/dL (ref 6.5–8.1)

## 2020-05-02 LAB — CBG MONITORING, ED: Glucose-Capillary: 529 mg/dL (ref 70–99)

## 2020-05-02 LAB — HIV ANTIBODY (ROUTINE TESTING W REFLEX): HIV Screen 4th Generation wRfx: NONREACTIVE

## 2020-05-02 LAB — GLUCOSE, RANDOM: Glucose, Bld: 629 mg/dL (ref 70–99)

## 2020-05-02 LAB — PROCALCITONIN: Procalcitonin: <0.1 ng/mL

## 2020-05-02 LAB — HEMOGLOBIN A1C
Hgb A1c MFr Bld: 7.9 % — ABNORMAL HIGH (ref 4.8–5.6)
Mean Plasma Glucose: 180.03 mg/dL

## 2020-05-02 MED ORDER — SODIUM CHLORIDE 0.9 % IV SOLN
1.0000 g | INTRAVENOUS | Status: DC
  Administered 2020-05-02: 15:00:00 1 g via INTRAVENOUS
  Filled 2020-05-02 (×3): qty 10

## 2020-05-02 MED ORDER — PRAVASTATIN SODIUM 20 MG PO TABS
20.0000 mg | ORAL_TABLET | Freq: Every evening | ORAL | Status: DC
Start: 2020-05-02 — End: 2020-05-02

## 2020-05-02 MED ORDER — INSULIN GLARGINE 100 UNIT/ML ~~LOC~~ SOLN
50.0000 [IU] | Freq: Every day | SUBCUTANEOUS | Status: DC
  Administered 2020-05-02: 10:00:00 50 [IU] via SUBCUTANEOUS
  Filled 2020-05-02 (×2): qty 0.5

## 2020-05-02 MED ORDER — INSULIN ASPART 100 UNIT/ML ~~LOC~~ SOLN: 0.0000 [IU] | Freq: Every day | SUBCUTANEOUS | Status: DC

## 2020-05-02 MED ORDER — INSULIN GLARGINE 100 UNIT/ML ~~LOC~~ SOLN
10.0000 [IU] | Freq: Once | SUBCUTANEOUS | Status: AC
  Administered 2020-05-02: 23:00:00 10 [IU] via SUBCUTANEOUS
  Filled 2020-05-02: qty 0.1

## 2020-05-02 MED ORDER — INSULIN ASPART 100 UNIT/ML ~~LOC~~ SOLN
0.0000 [IU] | SUBCUTANEOUS | Status: DC
  Administered 2020-05-03: 13:00:00 7 [IU] via SUBCUTANEOUS
  Administered 2020-05-03: 09:00:00 11 [IU] via SUBCUTANEOUS
  Administered 2020-05-03: 05:00:00 20 [IU] via SUBCUTANEOUS
  Filled 2020-05-02 (×3): qty 1

## 2020-05-02 MED ORDER — FUROSEMIDE 40 MG PO TABS
40.0000 mg | ORAL_TABLET | Freq: Every day | ORAL | Status: DC
  Administered 2020-05-02 – 2020-05-03 (×2): 40 mg via ORAL
  Filled 2020-05-02 (×2): qty 1

## 2020-05-02 MED ORDER — INSULIN ASPART 100 UNIT/ML ~~LOC~~ SOLN
20.0000 [IU] | Freq: Once | SUBCUTANEOUS | Status: AC
  Administered 2020-05-02: 22:00:00 20 [IU] via SUBCUTANEOUS
  Filled 2020-05-02: qty 1

## 2020-05-02 MED ORDER — INSULIN ASPART 100 UNIT/ML ~~LOC~~ SOLN
0.0000 [IU] | Freq: Three times a day (TID) | SUBCUTANEOUS | Status: DC
  Administered 2020-05-02: 15:00:00 20 [IU] via SUBCUTANEOUS
  Filled 2020-05-02: qty 1

## 2020-05-02 NOTE — ED Notes (Signed)
Pt's O2 sat noted to decrease to 81% when standing to urinate. Patient provided with incentive sperometer per request, "it helps me breathe better".  433mL emptied from urinal.  Patient requested an AM weight to be done d/t being on lasix, "after my wife brings me clean pants".

## 2020-05-02 NOTE — Progress Notes (Signed)
PROGRESS NOTE    Ernest Mclean  LDJ:[id number] DOB: 1941/02/16 DOA: 05/01/2020 PCP: Juluis Pitch, MD   Brief Narrative:   HPI: Ernest Mclean is a 78 y.o. male with medical history significant of diastolic CHF, A. fib on Eliquis, diabetes mellitus, GERD, hypertension, CKD stage IIIa , COPD on 2 L supplemental oxygen at baseline,  obesity, obstructive sleep apnea on CPAP,  CAD s/p CABG.  He follows with Dr. Clayborn Bigness in [facility] clinic presents in the emergency department with one day history of worsening shortness of breath.  Patient usually uses 2 L of supplemental oxygen at baseline and uses CPAP at night.  Wife reports patient had a fever yesterday 101.89F and was given 2 extra strength Tylenol,  fever resolved . He reports worsening shortness of breath today morning. He denied any chest pain, palpitation, dizziness,  flank pain,  abdominal pain, nausea ,vomiting, diarrhea.  His O2 saturation yesterday at home was between 70 to 80%.  ED Course: He was found to be short of breath.  He denies any chest pain, palpitations, dizziness. He is afebrile, hypertensive ,  heart rate 56, blood pressure 173/ 66. He is 98% on 4 L/min. Labs include sodium 141, potassium 3.8, chloride 105, bicarb 27, BUN 36, creatinine 1.73, glucose 252, calcium 8. BNP 358, troponin I86, lactic acid 1.1, WBC 10.1, hemoglobin 11.1, hematocrit 35.8, platelet 174, influenza negative, Covid negative, RSV negative.  Chest x-ray: Increased bibasilar atelectasis versus developing pneumonia   Assessment & Plan:   Principal Problem:   CHF exacerbation (HCC) Active Problems:   OSA on CPAP   COPD (chronic obstructive pulmonary disease) (HCC)   Type II diabetes mellitus with renal manifestations (HCC)   HTN (hypertension)   CAD (coronary artery disease)   Atrial flutter (HCC)   GERD (gastroesophageal reflux disease)   Chronic respiratory failure (HCC)   Anxiety   Chronic diastolic CHF (congestive heart failure) (HCC)   CKD  (chronic kidney disease), stage IIIa  Acute on chronic hypoxic respiratory failure secondary to acute COPD exacerbation, community-acquired pneumonia and possibly acute CHF exacerbation: Patient uses 2 L of oxygen at baseline due to COPD.  Currently requiring 4 L.  He feels better clinically.  Able to speak in full sentences.  Personally reviewed chest x-ray and I do think that he possibly has pneumonia.  He is on azithromycin, I will initiate Rocephin.  Will check urine streptococci and Legionella antigen as well as sputum culture.  He is not wheezy today, according to the H&P, he was wheezy yesterday.  I will switch him from IV Solu-Medrol to oral prednisone.  Continue bronchodilators.    Acute on chronic congestive heart failure with preserved ejection fraction: According to H&P, patient was thought to be having acute on chronic congestive heart failure.  At this point in time, I do not think that is the case as he has no crackles, no peripheral edema, BNP is at his baseline as well.  We will switch him from IV Lasix to oral Lasix 40 mg p.o. daily.  Elevated troponin: Flat.  No complaint of chest pain or shortness of breath.  Likely demand ischemia as cardiology has mentioned in the note.  Essential hypertension : Blood pressure fluctuating but currently controlled.  Continue metoprolol and amlodipine.  Paroxysmal atrial fibrillation: Heart rate under control, Continue metoprolol and Eliquis.  AKI on CKD stage IIIb: Baseline creatinine around 1.5.  Presented with 1.7.  Slightly elevated today.  Will recheck tomorrow.  Obstructive sleep apnea:  Continue CPAP at night.   Uncontrolled type II diabetes mellitus with hyperglycemia: Hemoglobin A1c 7.9.  Patient takes 50 units of Lantus at home.  He is currently on SSI and he is significantly hyperglycemic with blood sugar over 500, part of this is due to being on Solu-Medrol.  Will initiate back on 50 minutes of Lantus and continue  SSI.  Chronic anxiety: Continue Valium, Celexa.  DVT prophylaxis: apixaban (ELIQUIS) tablet 2.5 mg Start: 05/01/20 1530   Code Status: Full Code  Family Communication: Patient's wife present at bedside.  Plan of care discussed with patient and his wife in length and both verbalized understanding and agreed with it.  Status is: Inpatient  Remains inpatient appropriate because:Inpatient level of care appropriate due to severity of illness   Dispo: The patient is from: Home              Anticipated d/c is to: Home              Anticipated d/c date is: 1 day              Patient currently is not medically stable to d/c.        Estimated body mass index is 29.84 kg/m as calculated from the following:   Height as of this encounter: 6' 0.5" (1.842 m).   Weight as of this encounter: 101.2 kg.      Nutritional status:               Consultants:   Cardiology  Procedures:   None  Antimicrobials:  Anti-infectives (From admission, onward)   Start     Dose/Rate Route Frequency Ordered Stop   05/01/20 1700  azithromycin (ZITHROMAX) 500 mg in sodium chloride 0.9 % 250 mL IVPB        500 mg 250 mL/hr over 60 Minutes Intravenous Every 24 hours 05/01/20 1625           Subjective: Patient seen and examined.  His wife was at the bedside.  Patient states that he feels much better than yesterday however he is still requiring 4 L of oxygen.  Objective: Vitals:   05/02/20 0600 05/02/20 0630 05/02/20 0900 05/02/20 1000  BP: (!) 142/73  (!) 192/82 (!) 147/66  Pulse:   75 77  Resp: 20 15 18 20   Temp:      TempSrc:      SpO2: 97% 98% 97% 95%  Weight:      Height:        Intake/Output Summary (Last 24 hours) at 05/02/2020 1035 Last data filed at 05/02/2020 0537 Gross per 24 hour  Intake --  Output 1250 ml  Net -1250 ml   Filed Weights   05/01/20 1013  Weight: 101.2 kg    Examination:  General exam: Appears calm and comfortable  Respiratory system:  Scattered and minimal rhonchi bilaterally with no wheezes or crackles. Respiratory effort normal. Cardiovascular system: S1 & S2 heard, RRR. No JVD, murmurs, rubs, gallops or clicks. No pedal edema. Gastrointestinal system: Abdomen is nondistended, soft and nontender. No organomegaly or masses felt. Normal bowel sounds heard. Central nervous system: Alert and oriented. No focal neurological deficits. Extremities: Symmetric 5 x 5 power. Skin: No rashes, lesions or ulcers Psychiatry: Judgement and insight appear normal. Mood & affect appropriate.    Data Reviewed: I have personally reviewed following labs and imaging studies  CBC: Recent Labs  Lab 05/01/20 1023 05/02/20 0538  WBC 10.1 9.6  HGB 11.1* 11.5*  HCT  35.8* 35.3*  MCV 87.3 85.7  PLT 174 063   Basic Metabolic Panel: Recent Labs  Lab 05/01/20 1023 05/02/20 0538  NA 141 135  K 3.8 4.3  CL 105 99  CO2 27 26  GLUCOSE 252* 556*  BUN 36* 42*  CREATININE 1.73* 1.84*  CALCIUM 8.8* 8.7*   GFR: Estimated Creatinine Clearance: 41.8 mL/min (A) (by C-G formula based on SCr of 1.84 mg/dL (H)). Liver Function Tests: Recent Labs  Lab 05/02/20 0538  AST 18  ALT 15  ALKPHOS 111  BILITOT 1.2  PROT 7.1  ALBUMIN 3.2*   No results for input(s): LIPASE, AMYLASE in the last 168 hours. No results for input(s): AMMONIA in the last 168 hours. Coagulation Profile: No results for input(s): INR, PROTIME in the last 168 hours. Cardiac Enzymes: No results for input(s): CKTOTAL, CKMB, CKMBINDEX, TROPONINI in the last 168 hours. BNP (last 3 results) No results for input(s): PROBNP in the last 8760 hours. HbA1C: Recent Labs    05/01/20 1802  HGBA1C 7.9*   CBG: Recent Labs  Lab 05/01/20 1800 05/01/20 2223  GLUCAP 284* 478*   Lipid Profile: No results for input(s): CHOL, HDL, LDLCALC, TRIG, CHOLHDL, LDLDIRECT in the last 72 hours. Thyroid Function Tests: No results for input(s): TSH, T4TOTAL, FREET4, T3FREE, THYROIDAB in the  last 72 hours. Anemia Panel: No results for input(s): VITAMINB12, FOLATE, FERRITIN, TIBC, IRON, RETICCTPCT in the last 72 hours. Sepsis Labs: Recent Labs  Lab 05/01/20 1328 05/01/20 1802 05/02/20 0538  PROCALCITON  --  <0.10 <0.10  LATICACIDVEN 1.1  --   --     Recent Results (from the past 240 hour(s))  Resp Panel by RT-PCR (Flu A&B, Covid) Nasopharyngeal Swab     Status: None   Collection Time: 05/01/20  1:28 PM   Specimen: Nasopharyngeal Swab; Nasopharyngeal(NP) swabs in vial transport medium  Result Value Ref Range Status   SARS Coronavirus 2 by RT PCR NEGATIVE NEGATIVE Final    Comment: (NOTE) SARS-CoV-2 target nucleic acids are NOT DETECTED.  The SARS-CoV-2 RNA is generally detectable in upper respiratory specimens during the acute phase of infection. The lowest concentration of SARS-CoV-2 viral copies this assay can detect is 138 copies/mL. A negative result does not preclude SARS-Cov-2 infection and should not be used as the sole basis for treatment or other patient management decisions. A negative result may occur with  improper specimen collection/handling, submission of specimen other than nasopharyngeal swab, presence of viral mutation(s) within the areas targeted by this assay, and inadequate number of viral copies(<138 copies/mL). A negative result must be combined with clinical observations, patient history, and epidemiological information. The expected result is Negative.  Fact Sheet for Patients:  EntrepreneurPulse.com.au  Fact Sheet for Healthcare Providers:  IncredibleEmployment.be  This test is no t yet approved or cleared by the [place] FDA and  has been authorized for detection and/or diagnosis of SARS-CoV-2 by FDA under an Emergency Use Authorization (EUA). This EUA will remain  in effect (meaning this test can be used) for the duration of the COVID-19 declaration under Section 564(b)(1) of the Act,  21 U.S.C.section 360bbb-3(b)(1), unless the authorization is terminated  or revoked sooner.       Influenza A by PCR NEGATIVE NEGATIVE Final   Influenza B by PCR NEGATIVE NEGATIVE Final    Comment: (NOTE) The Xpert Xpress SARS-CoV-2/FLU/RSV plus assay is intended as an aid in the diagnosis of influenza from Nasopharyngeal swab specimens and should not be used as a sole  basis for treatment. Nasal washings and aspirates are unacceptable for Xpert Xpress SARS-CoV-2/FLU/RSV testing.  Fact Sheet for Patients: EntrepreneurPulse.com.au  Fact Sheet for Healthcare Providers: IncredibleEmployment.be  This test is not yet approved or cleared by the [place] FDA and has been authorized for detection and/or diagnosis of SARS-CoV-2 by FDA under an Emergency Use Authorization (EUA). This EUA will remain in effect (meaning this test can be used) for the duration of the COVID-19 declaration under Section 564(b)(1) of the Act, 21 U.S.C. section 360bbb-3(b)(1), unless the authorization is terminated or revoked.  Performed at [facility], [address], [place], [place] [address]       Radiology Studies: DG Chest 2 View  Result Date: 05/01/2020 CLINICAL DATA:  Shortness of breath, O2 requirement, hypertension and diabetes EXAM: CHEST - 2 VIEW COMPARISON:  09/27/2019 FINDINGS: Stable cardiomegaly and previous median sternotomy for coronary bypass. Similar central vascular congestion. Increased bibasilar ill-defined bronchovascular opacities over the hemidiaphragms compatible with atelectasis or bibasilar bronchopneumonia. No superimposed edema pattern, significant effusion or pneumothorax. Trachea midline. Aorta atherosclerotic. Degenerative changes of the spine. Remote right rib fractures. IMPRESSION: Increased bibasilar atelectasis versus developing pneumonia. Stable cardiomegaly and postoperative findings. Aortic Atherosclerosis (ICD10-I70.0).  Electronically Signed   By: Jerilynn Mages.  Shick M.D.   On: 05/01/2020 11:16   ECHOCARDIOGRAM COMPLETE  Result Date: 05/01/2020    ECHOCARDIOGRAM REPORT   Patient Name:   Jazir A Bryars Date of Exam: 05/01/2020 Medical Rec #:  [id number]    Height:       72.5 in Accession #:    [id number]   Weight:       223.1 lb Date of Birth:  [birth year]   BSA:          2.243 m Patient Age:    30 years     BP:           173/66 mmHg Patient Gender: M            HR:           63 bpm. Exam Location:  [facility] Procedure: 2D Echo and Intracardiac Opacification Agent Indications:     Dyspnea R06.00  History:         Patient has prior history of Echocardiogram examinations, most                  recent 08/02/2016.  Sonographer:     Arville Go RDCS Referring Phys:  [place] Diagnosing Phys: Isaias Cowman MD  Sonographer Comments: Technically difficult study due to poor echo windows. Image acquisition challenging due to patient body habitus. IMPRESSIONS  1. Left ventricular ejection fraction, by estimation, is 55 to 60%. The left ventricle has normal function. The left ventricle has no regional wall motion abnormalities. Left ventricular diastolic parameters were normal.  2. Right ventricular systolic function is normal. The right ventricular size is normal.  3. The mitral valve is normal in structure. Mild mitral valve regurgitation. No evidence of mitral stenosis.  4. The aortic valve is normal in structure. Aortic valve regurgitation is not visualized. No aortic stenosis is present.  5. The inferior vena cava is normal in size with greater than 50% respiratory variability, suggesting right atrial pressure of 3 mmHg. FINDINGS  Left Ventricle: Left ventricular ejection fraction, by estimation, is 55 to 60%. The left ventricle has normal function. The left ventricle has no regional wall motion abnormalities. Definity contrast agent was given IV to delineate the left ventricular  endocardial borders. The left  ventricular internal  cavity size was normal in size. There is no left ventricular hypertrophy. Left ventricular diastolic parameters were normal. Right Ventricle: The right ventricular size is normal. No increase in right ventricular wall thickness. Right ventricular systolic function is normal. Left Atrium: Left atrial size was normal in size. Right Atrium: Right atrial size was normal in size. Pericardium: There is no evidence of pericardial effusion. Mitral Valve: The mitral valve is normal in structure. Mild mitral valve regurgitation. No evidence of mitral valve stenosis. Tricuspid Valve: The tricuspid valve is normal in structure. Tricuspid valve regurgitation is mild . No evidence of tricuspid stenosis. Aortic Valve: The aortic valve is normal in structure. Aortic valve regurgitation is not visualized. No aortic stenosis is present. Aortic valve peak gradient measures 6.0 mmHg. Pulmonic Valve: The pulmonic valve was normal in structure. Pulmonic valve regurgitation is not visualized. No evidence of pulmonic stenosis. Aorta: The aortic root is normal in size and structure. Venous: The inferior vena cava is normal in size with greater than 50% respiratory variability, suggesting right atrial pressure of 3 mmHg. IAS/Shunts: No atrial level shunt detected by color flow Doppler.  LEFT VENTRICLE PLAX 2D LVIDd:         4.39 cm  Diastology LVIDs:         3.10 cm  LV e' medial:    7.72 cm/s LV PW:         1.25 cm  LV E/e' medial:  17.2 LV IVS:        1.48 cm  LV e' lateral:   7.07 cm/s LVOT diam:     2.10 cm  LV E/e' lateral: 18.8 LV SV:         79 LV SV Index:   35 LVOT Area:     3.46 cm  RIGHT VENTRICLE RV Basal diam:  3.33 cm RV S prime:     9.03 cm/s TAPSE (M-mode): 1.6 cm LEFT ATRIUM             Index       RIGHT ATRIUM           Index LA diam:        4.70 cm 2.10 cm/m  RA Area:     14.50 cm LA Vol (A2C):   54.3 ml 24.20 ml/m RA Volume:   34.30 ml  15.29 ml/m LA Vol (A4C):   43.9 ml 19.57 ml/m LA Biplane Vol: 53.2 ml 23.71  ml/m  AORTIC VALVE                PULMONIC VALVE AV Area (Vmax): 2.63 cm    PV Vmax:       0.86 m/s AV Vmax:        122.00 cm/s PV Peak grad:  2.9 mmHg AV Peak Grad:   6.0 mmHg LVOT Vmax:      92.50 cm/s LVOT Vmean:     59.500 cm/s LVOT VTI:       0.229 m  AORTA Ao Root diam: 3.80 cm Ao Asc diam:  3.60 cm MITRAL VALVE                TRICUSPID VALVE MV Area (PHT): 4.54 cm     TV Peak grad:   54.7 mmHg MV Decel Time: 167 msec     TV Vmax:        3.70 m/s MV E velocity: 133.00 cm/s MV A velocity: 52.00 cm/s   SHUNTS MV E/A ratio:  2.56  Systemic VTI:  0.23 m                             Systemic Diam: 2.10 cm Isaias Cowman MD Electronically signed by Isaias Cowman MD Signature Date/Time: 05/01/2020/5:03:26 PM    Final     Scheduled Meds: . amLODipine  5 mg Oral Daily  . apixaban  2.5 mg Oral BID  . aspirin EC  81 mg Oral Daily  . atorvastatin  20 mg Oral Daily  . citalopram  20 mg Oral QHS  . furosemide  40 mg Oral Daily  . insulin aspart  0-9 Units Subcutaneous TID WC  . insulin glargine  50 Units Subcutaneous Daily  . lisinopril  5 mg Oral Daily  . metoprolol tartrate  50 mg Oral BID  . mometasone-formoterol  2 puff Inhalation BID  . pantoprazole  40 mg Oral Daily  . pregabalin  25 mg Oral BID  . sodium chloride flush  3 mL Intravenous Q12H  . tiotropium  18 mcg Inhalation Daily  . vitamin B-12  500 mcg Oral Daily   Continuous Infusions: . sodium chloride    . azithromycin Stopped (05/01/20 1839)     LOS: 1 day   Time spent: 37 minutes   Darliss Cheney, MD [facility]  05/02/2020, 10:35 AM   To contact the attending provider between 7A-7P or the covering provider during after hours 7P-7A, please log into the web site [email or web address].

## 2020-05-02 NOTE — ED Notes (Signed)
Pt ambulated to the restroom without assistance

## 2020-05-02 NOTE — ED Notes (Addendum)
Pt ambulatory to the restroom and back at this time.

## 2020-05-02 NOTE — ED Notes (Signed)
Urinal emptied of 649ml urine.  Pt denies further needs at this time.

## 2020-05-02 NOTE — ED Notes (Signed)
Pt's O2 decreased to 83% on RA when pt stood to use urinal. Pt placed back on O2, O2 sat increased to 96%.

## 2020-05-02 NOTE — ED Notes (Addendum)
Jonny Ruiz, NP paged to inform of critical glucose of 556. Verbally spoke with NP, NP acknowledged, no new orders given.  Awaiting orders at this time.

## 2020-05-02 NOTE — Progress Notes (Signed)
OT Cancellation Note  Patient Details Name: OCEAN SCHILDT MRN: [id number] DOB: [birth year]   Cancelled Treatment:    Reason Eval/Treat Not Completed: Medical issues which prohibited therapy. Chart reviewed - pt noted to have BGL critically high at 556; contraindicated for exertional activity at this time. Will continue to follow and initiate services as pt medically appropriate to participate in therapy.   Dessie Coma, M.S. OTR/L  05/02/20, 8:18 AM  ascom [phone]

## 2020-05-02 NOTE — ED Notes (Signed)
Patient resting quietly at this time, denies any needs.

## 2020-05-02 NOTE — Consult Note (Signed)
[facility] Cardiology  CARDIOLOGY CONSULT NOTE  Patient ID: Ernest Mclean MRN: [id number] DOB/AGE: [birth year] 79 y.o.  Admit date: 05/01/2020 Referring Physician P[place]wani Primary Physician [place] Primary Cardiologist [facility] Reason for Consultation congestive heart failure  HPI: 79 year old gentleman referred for evaluation of congestive heart failure.  Patient has a history of chronic diastolic congestive heart failure, paroxysmal atrial fibrillation on Eliquis for stroke prevention, COPD on supplemental O2, and coronary artery disease, status post CABG.  Patient presents to [facility] emergency room with 1 to 2-day history of shortness of breath and mild fever.  Orting to the wife, the patient has been compliant with low-sodium diet for the past 2 weeks, as noted edema, weight gain and worsening shortness of breath.  ECG revealed atrial flutter with controlled ventricular rate of 68 bpm.  Chest x-ray revealed increased bibasilar atelectasis.  Admission labs notable for high-sensitivity troponin I06 and 190.  BNP 350.1.  The patient received furosemide 40 mg IV with subsequent diuresis and overall clinical improvement.  2D echocardiogram 05/01/2020 revealed LVEF 55 to 60%.  Review of systems complete and found to be negative unless listed above     Past Medical History:  Diagnosis Date  . Atrial fibrillation ([place])   . Basal cell carcinoma 06/2017   Left Ear  . CAD (coronary artery disease)   . CKD (chronic kidney disease)   . Congestive heart failure ([place])   . COPD (chronic obstructive pulmonary disease) ([place])   . Diabetes ([place])   . Hypertension   . OSA on CPAP   . 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 CLAVICLE SURGERY    . heart bypass    . HERNIA REPAIR    . MOHS SURGERY  07/10/2017   Head    (Not in a hospital admission)  Social History   Socioeconomic History  . Marital status: Married    Spouse name: Not on file   . Number of children: Not on file  . Years of education: Not on file  . Highest education level: Not on file  Occupational History  . Not on file  Tobacco Use  . Smoking status: Former Smoker    Packs/day: 1.00    Years: 30.00    Pack years: 30.00    Types: Cigarettes    Quit date: 12/14/2001    Years since quitting: 18.3  . Smokeless tobacco: Never Used  . Tobacco comment: quit 12/14/2001  Vaping Use  . Vaping Use: Never used  Substance and Sexual Activity  . Alcohol use: No    Alcohol/week: 0.0 standard drinks  . Drug use: No  . Sexual activity: Not on file  Other Topics Concern  . Not on file  Social History Narrative  . Not on file   Social Determinants of Health   Financial Resource Strain: Not on file  Food Insecurity: Not on file  Transportation Needs: Not on file  Physical Activity: Not on file  Stress: Not on file  Social Connections: Not on file  Intimate Partner Violence: Not on file    Family History  Problem Relation Age of Onset  . Stroke Other   . Diabetes Other   . Breast cancer Other   . Colon cancer Other       Review of systems complete and found to be negative unless listed above      PHYSICAL EXAM  General: Well developed, well nourished, in no acute  distress HEENT:  Normocephalic and atramatic Neck:  No JVD.  Lungs: Clear bilaterally to auscultation and percussion. Heart: HRRR . Normal S1 and S2 without gallops or murmurs.  Abdomen: Bowel sounds are positive, abdomen soft and non-tender  Msk:  Back normal, normal gait. Normal strength and tone for age. Extremities: No clubbing, cyanosis or edema.   Neuro: Alert and oriented X 3. Psych:  Good affect, responds appropriately  Labs:   Lab Results  Component Value Date   WBC 9.6 05/02/2020   HGB 11.5 (L) 05/02/2020   HCT 35.3 (L) 05/02/2020   MCV 85.7 05/02/2020   PLT 179 05/02/2020    Recent Labs  Lab 05/02/20 0538  NA 135  K 4.3  CL 99  CO2 26  BUN 42*  CREATININE  1.84*  CALCIUM 8.7*  PROT 7.1  BILITOT 1.2  ALKPHOS 111  ALT 15  AST 18  GLUCOSE 556*   Lab Results  Component Value Date   CKMB 2.3 10/05/2013   TROPONINI <0.03 08/01/2016    Lab Results  Component Value Date   CHOL 101 09/28/2019   CHOL 160 06/20/2014   Lab Results  Component Value Date   HDL 28 (L) 09/28/2019   HDL 28 (L) 06/20/2014   Lab Results  Component Value Date   LDLCALC 60 09/28/2019   LDLCALC 70 06/20/2014   Lab Results  Component Value Date   TRIG 67 09/28/2019   TRIG 311 (H) 06/20/2014   Lab Results  Component Value Date   CHOLHDL 3.6 09/28/2019   No results found for: LDLDIRECT    Radiology: DG Chest 2 View  Result Date: 05/01/2020 CLINICAL DATA:  Shortness of breath, O2 requirement, hypertension and diabetes EXAM: CHEST - 2 VIEW COMPARISON:  09/27/2019 FINDINGS: Stable cardiomegaly and previous median sternotomy for coronary bypass. Similar central vascular congestion. Increased bibasilar ill-defined bronchovascular opacities over the hemidiaphragms compatible with atelectasis or bibasilar bronchopneumonia. No superimposed edema pattern, significant effusion or pneumothorax. Trachea midline. Aorta atherosclerotic. Degenerative changes of the spine. Remote right rib fractures. IMPRESSION: Increased bibasilar atelectasis versus developing pneumonia. Stable cardiomegaly and postoperative findings. Aortic Atherosclerosis (ICD10-I70.0). Electronically Signed   By: Jerilynn Mages.  Shick M.D.   On: 05/01/2020 11:16   ECHOCARDIOGRAM COMPLETE  Result Date: 05/01/2020    ECHOCARDIOGRAM REPORT   Patient Name:   Ernest Mclean Date of Exam: 05/01/2020 Medical Rec #:  [id number]    Height:       72.5 in Accession #:    [id number]   Weight:       223.1 lb Date of Birth:  1940-08-18   BSA:          2.243 m Patient Age:    79 years     BP:           173/66 mmHg Patient Gender: M            HR:           63 bpm. Exam Location:  [facility] Procedure: 2D Echo and Intracardiac Opacification  Agent Indications:     Dyspnea R06.00  History:         Patient has prior history of Echocardiogram examinations, most                  recent 08/02/2016.  Sonographer:     Arville Go RDCS Referring Phys:  [place] Diagnosing Phys: Isaias Cowman MD  Sonographer Comments: Technically difficult study due to poor echo windows. Image acquisition  challenging due to patient body habitus. IMPRESSIONS  1. Left ventricular ejection fraction, by estimation, is 55 to 60%. The left ventricle has normal function. The left ventricle has no regional wall motion abnormalities. Left ventricular diastolic parameters were normal.  2. Right ventricular systolic function is normal. The right ventricular size is normal.  3. The mitral valve is normal in structure. Mild mitral valve regurgitation. No evidence of mitral stenosis.  4. The aortic valve is normal in structure. Aortic valve regurgitation is not visualized. No aortic stenosis is present.  5. The inferior vena cava is normal in size with greater than 50% respiratory variability, suggesting right atrial pressure of 3 mmHg. FINDINGS  Left Ventricle: Left ventricular ejection fraction, by estimation, is 55 to 60%. The left ventricle has normal function. The left ventricle has no regional wall motion abnormalities. Definity contrast agent was given IV to delineate the left ventricular  endocardial borders. The left ventricular internal cavity size was normal in size. There is no left ventricular hypertrophy. Left ventricular diastolic parameters were normal. Right Ventricle: The right ventricular size is normal. No increase in right ventricular wall thickness. Right ventricular systolic function is normal. Left Atrium: Left atrial size was normal in size. Right Atrium: Right atrial size was normal in size. Pericardium: There is no evidence of pericardial effusion. Mitral Valve: The mitral valve is normal in structure. Mild mitral valve regurgitation. No  evidence of mitral valve stenosis. Tricuspid Valve: The tricuspid valve is normal in structure. Tricuspid valve regurgitation is mild . No evidence of tricuspid stenosis. Aortic Valve: The aortic valve is normal in structure. Aortic valve regurgitation is not visualized. No aortic stenosis is present. Aortic valve peak gradient measures 6.0 mmHg. Pulmonic Valve: The pulmonic valve was normal in structure. Pulmonic valve regurgitation is not visualized. No evidence of pulmonic stenosis. Aorta: The aortic root is normal in size and structure. Venous: The inferior vena cava is normal in size with greater than 50% respiratory variability, suggesting right atrial pressure of 3 mmHg. IAS/Shunts: No atrial level shunt detected by color flow Doppler.  LEFT VENTRICLE PLAX 2D LVIDd:         4.39 cm  Diastology LVIDs:         3.10 cm  LV e' medial:    7.72 cm/s LV PW:         1.25 cm  LV E/e' medial:  17.2 LV IVS:        1.48 cm  LV e' lateral:   7.07 cm/s LVOT diam:     2.10 cm  LV E/e' lateral: 18.8 LV SV:         79 LV SV Index:   35 LVOT Area:     3.46 cm  RIGHT VENTRICLE RV Basal diam:  3.33 cm RV S prime:     9.03 cm/s TAPSE (M-mode): 1.6 cm LEFT ATRIUM             Index       RIGHT ATRIUM           Index LA diam:        4.70 cm 2.10 cm/m  RA Area:     14.50 cm LA Vol (A2C):   54.3 ml 24.20 ml/m RA Volume:   34.30 ml  15.29 ml/m LA Vol (A4C):   43.9 ml 19.57 ml/m LA Biplane Vol: 53.2 ml 23.71 ml/m  AORTIC VALVE                PULMONIC  VALVE AV Area (Vmax): 2.63 cm    PV Vmax:       0.86 m/s AV Vmax:        122.00 cm/s PV Peak grad:  2.9 mmHg AV Peak Grad:   6.0 mmHg LVOT Vmax:      92.50 cm/s LVOT Vmean:     59.500 cm/s LVOT VTI:       0.229 m  AORTA Ao Root diam: 3.80 cm Ao Asc diam:  3.60 cm MITRAL VALVE                TRICUSPID VALVE MV Area (PHT): 4.54 cm     TV Peak grad:   54.7 mmHg MV Decel Time: 167 msec     TV Vmax:        3.70 m/s MV E velocity: 133.00 cm/s MV A velocity: 52.00 cm/s   SHUNTS MV E/A  ratio:  2.56         Systemic VTI:  0.23 m                             Systemic Diam: 2.10 cm Isaias Cowman MD Electronically signed by Isaias Cowman MD Signature Date/Time: 05/01/2020/5:03:26 PM    Final     EKG: Atrial flutter at 68 bpm  ASSESSMENT AND PLAN:   1.  Acute on chronic diastolic congestive heart failure, likely due to dietary noncompliance, improvement after initial dose of furosemide 40 mg IV with diuresis, 2D echocardiogram 05/01/2020 revealed LV ejection fraction 55 to 60% 2.  Borderline elevated troponin (106,190), the absence of chest pain or evidence for myocardial ischemia, most likely demand supply secondary to acute on chronic diastolic CHF ( C12.X5) 3.  Paroxysmal atrial fibrillation, rate controlled, on Eliquis for stroke prevention 4.  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  COPD exacerbation, with Solu-Medrol, and DuoNeb every 4 hours  Recommendations  1.  Agree current therapy 2.  Continue diuresis 3.  Carefully monitor renal status 4.  Defer full dose anticoagulation 5.  Continue Eliquis for stroke prevention 6.  Continue metoprolol tartrate and lisinopril blood pressure control 7.  If patient continues to improve clinically consider discharge in a.m., follow-up with Dr. Clayborn Bigness in 1 to 2 weeks  Signed: Isaias Cowman MD,PhD, [facility] 05/02/2020, 10:10 AM

## 2020-05-03 DIAGNOSIS — I5033 Acute on chronic diastolic (congestive) heart failure: Secondary | ICD-10-CM

## 2020-05-03 LAB — CBC WITH DIFFERENTIAL/PLATELET
Abs Immature Granulocytes: 0.12 10*3/uL — ABNORMAL HIGH (ref 0.00–0.07)
Basophils Absolute: 0 10*3/uL (ref 0.0–0.1)
Basophils Relative: 0 %
Eosinophils Absolute: 0 10*3/uL (ref 0.0–0.5)
Eosinophils Relative: 0 %
HCT: 34.9 % — ABNORMAL LOW (ref 39.0–52.0)
Hemoglobin: 11.1 g/dL — ABNORMAL LOW (ref 13.0–17.0)
Immature Granulocytes: 1 %
Lymphocytes Relative: 6 %
Lymphs Abs: 0.9 10*3/uL (ref 0.7–4.0)
MCH: 27.4 pg (ref 26.0–34.0)
MCHC: 31.8 g/dL (ref 30.0–36.0)
MCV: 86.2 fL (ref 80.0–100.0)
Monocytes Absolute: 1.4 10*3/uL — ABNORMAL HIGH (ref 0.1–1.0)
Monocytes Relative: 9 %
Neutro Abs: 12.7 10*3/uL — ABNORMAL HIGH (ref 1.7–7.7)
Neutrophils Relative %: 84 %
Platelets: 201 10*3/uL (ref 150–400)
RBC: 4.05 MIL/uL — ABNORMAL LOW (ref 4.22–5.81)
RDW: 16.9 % — ABNORMAL HIGH (ref 11.5–15.5)
WBC: 15.1 10*3/uL — ABNORMAL HIGH (ref 4.0–10.5)
nRBC: 0 % (ref 0.0–0.2)

## 2020-05-03 LAB — GLUCOSE, CAPILLARY
Glucose-Capillary: 437 mg/dL — ABNORMAL HIGH (ref 70–99)
Glucose-Capillary: 408 mg/dL — ABNORMAL HIGH (ref 70–99)
Glucose-Capillary: 349 mg/dL — ABNORMAL HIGH (ref 70–99)
Glucose-Capillary: 271 mg/dL — ABNORMAL HIGH (ref 70–99)
Glucose-Capillary: 230 mg/dL — ABNORMAL HIGH (ref 70–99)

## 2020-05-03 LAB — BASIC METABOLIC PANEL
Anion gap: 11 (ref 5–15)
BUN: 59 mg/dL — ABNORMAL HIGH (ref 8–23)
CO2: 26 mmol/L (ref 22–32)
Calcium: 8.7 mg/dL — ABNORMAL LOW (ref 8.9–10.3)
Chloride: 100 mmol/L (ref 98–111)
Creatinine, Ser: 1.95 mg/dL — ABNORMAL HIGH (ref 0.61–1.24)
GFR, Estimated: 35 mL/min — ABNORMAL LOW (ref >60)
Glucose, Bld: 421 mg/dL — ABNORMAL HIGH (ref 70–99)
Potassium: 4.5 mmol/L (ref 3.5–5.1)
Sodium: 137 mmol/L (ref 135–145)

## 2020-05-03 LAB — MAGNESIUM: Magnesium: 2.2 mg/dL (ref 1.7–2.4)

## 2020-05-03 LAB — PROCALCITONIN: Procalcitonin: <0.1 ng/mL

## 2020-05-03 MED ORDER — CEFDINIR 300 MG PO CAPS: 300.0000 mg | ORAL_CAPSULE | Freq: Two times a day (BID) | ORAL | 0 refills | Status: AC

## 2020-05-03 MED ORDER — PREDNISONE 50 MG PO TABS: 50.0000 mg | ORAL_TABLET | Freq: Every day | ORAL | 0 refills | Status: AC

## 2020-05-03 MED ORDER — INSULIN GLARGINE 100 UNIT/ML ~~LOC~~ SOLN
60.0000 [IU] | Freq: Every day | SUBCUTANEOUS | Status: DC
  Filled 2020-05-03 (×2): qty 0.6

## 2020-05-03 NOTE — Plan of Care (Signed)
DISCHARGE NOTE HOME Ernest Mclean to be discharged home per MD order. Discussed prescriptions and follow up appointments with the patient. Prescriptions given to patient; medication list explained in detail. Patient verbalized understanding.  Skin clean, dry and intact without evidence of skin break down, no evidence of skin tears noted. IV catheter discontinued intact. Site without signs and symptoms of complications. Dressing and pressure applied. Pt denies pain at the site currently. No complaints noted.  Patient free of lines, drains, and wounds.   An After Visit Summary (AVS) was printed and given to the patient. Patient escorted via wheelchair, and discharged home via private auto.  Stephan Minister, RN

## 2020-05-03 NOTE — Progress Notes (Signed)
Mobility Specialist - Progress Note   05/03/20 1100  Mobility  Activity Ambulated in hall  Level of Assistance Contact guard assist, steadying assist  Assistive Device None  Distance Ambulated (ft) 325 ft  Mobility Response Tolerated well  Mobility performed by Mobility specialist  $Mobility charge 1 Mobility    Pre-mobility: 62 HR, 98% SpO2 During mobility: 77 HR, 86% SpO2 Post-mobility: 63 HR, 95% SpO2   Pt was sitting EOB upon arrival utilizing 3L [place] O2. Pt agreed to session. Pt denied any pain, nausea, or fatigue. Pt was independent performing STS at bedside. No AD was used for this session. Pt familiar with PLB and utilizes technique throughout session. Pt ambulated 325' in hallway on 2L. Mild LOB noted x2. After ~250', pt's O2 desat to 86%. Pt denied SOB. No s/s of distress. Mobility bumped O2 to 3L, which increased sats to 88% but unchanging. Mobility increased O2 to 4L, O2 >/= 92%. Pt denied weakness or dizziness throughout session. Upon return to room, pt was placed back on 3L and O2 desat to 88% while seated EOB. Rest used to increase sats. Overall, pt tolerated session well. Pt was left EOB with all needs in reach. 95% O2 before exit. Nurse notified.    Kathee Delton Mobility Specialist 05/03/20, 11:52 AM

## 2020-05-03 NOTE — Discharge Instructions (Signed)

## 2020-05-03 NOTE — Plan of Care (Signed)
  Problem: Education: Goal: Knowledge of General Education information will improve Description: Including pain rating scale, medication(s)/side effects and non-pharmacologic comfort measures Outcome: Progressing   Problem: Clinical Measurements: Goal: Ability to maintain clinical measurements within normal limits will improve Outcome: Progressing   Problem: Clinical Measurements: Goal: Diagnostic test results will improve Outcome: Progressing   Problem: Safety: Goal: Ability to remain free from injury will improve Outcome: Progressing   Problem: Clinical Measurements: Goal: Respiratory complications will improve Outcome: Progressing

## 2020-05-03 NOTE — TOC Progression Note (Signed)
Transition of Care [facility]) - Progression Note    Patient Details  Name: Ernest Mclean MRN: [id number] Date of Birth: [birth year]  Transition of Care [facility]) CM/SW Contact  Eileen Stanford, LCSW Phone Number: 05/03/2020, 1:12 PM  Clinical Narrative:  Patient is now agreeable to [facility]. Pt is agreeable to [facility].      Expected Discharge Plan: Home/Self Care Barriers to Discharge: No Barriers Identified  Expected Discharge Plan and Services Expected Discharge Plan: Home/Self Care In-house Referral: Clinical Social Work   Post Acute Care Choice: NA Living arrangements for the past 2 months: Single Family Home Expected Discharge Date: 05/03/20               DME Arranged: Oxygen DME Agency: [employer] Date DME Agency Contacted: 05/03/20 Time DME Agency Contacted: [id number] Representative spoke with at DME Agency: [email or web address]             Social Determinants of Health ([place]) Interventions    Readmission Risk Interventions No flowsheet data found.

## 2020-05-03 NOTE — Evaluation (Signed)
Occupational Therapy Evaluation Patient Details Name: Ernest Mclean MRN: [id number] DOB: 1940-11-13 Today's Date: 05/03/2020    History of Present Illness 79 y.o. male with medical history significant of diastolic CHF, A. fib on Eliquis, diabetes mellitus, GERD, hypertension, CKD stage IIIa , COPD on 2 L supplemental oxygen at baseline,  obesity, obstructive sleep apnea on CPAP,  CAD s/p CABG. presents in the emergency department with one day history of worsening shortness of breath.  Patient usually uses 2 L of supplemental oxygen at baseline and uses CPAP at night.  Wife reports patient had a fever yesterday 101.60F and was given 2 extra strength Tylenol,  fever resolved . He reports worsening shortness of breath today morning. He denied any chest pain, palpitation, dizziness,  flank pain,  abdominal pain, nausea ,vomiting, diarrhea.  His O2 saturation yesterday at home was between 70 to 80%.   Clinical Impression   Pt was seen for OT evaluation this date. Prior to hospital admission, pt was independent with short distances and modified independent with seated shower. Pt manages his medications and up until recently had been diligent with weighing himself each morning and watching his sodium intake, per pt/wife report. Currently pt demonstrates with mild balance deficits and activity tolerance impairments as described below (See OT problem list) which functionally limit his ability to perform ADL/self-care tasks. Pt tolerated standing at the sink for simulated grooming tasks for >52min with SpO2 >95% on 4L and HR in low 60's. Pt denied SOB. Pt currently requires supervision to [place] for ADL. Requires intermittent rest breaks for more exertional activity. Pt/spouse instructed in chronic disease mgt strategies to improve adherence to weighing daily, falls prevention, stress mgt, and encouraged seeking out support systems for optimizing emotional and mental well-being. Both verbalized understanding. Do not  currently anticipate need for skilled OT services. Will sign off.      Follow Up Recommendations  No OT follow up    Equipment Recommendations  None recommended by OT    Recommendations for Other Services       Precautions / Restrictions Precautions Precautions: Fall Restrictions Weight Bearing Restrictions: No      Mobility Bed Mobility Overal bed mobility: Modified Independent                  Transfers Overall transfer level: Modified independent                    Balance Overall balance assessment: Needs assistance Sitting-balance support: No upper extremity supported;Single extremity supported;Feet supported Sitting balance-Leahy Scale: Good     Standing balance support: No upper extremity supported;During functional activity Standing balance-Leahy Scale: Fair                             ADL either performed or assessed with clinical judgement   ADL Overall ADL's : Modified independent                                             Vision Baseline Vision/History: Wears glasses Wears Glasses: At all times Patient Visual Report: No change from baseline       Perception     Praxis      Pertinent Vitals/Pain Pain Assessment: No/denies pain     Hand Dominance Left (ambidextrous)   Extremity/Trunk Assessment Upper Extremity Assessment Upper Extremity  Assessment: Overall WFL for tasks assessed   Lower Extremity Assessment Lower Extremity Assessment: Overall WFL for tasks assessed (hx Charcot foot R)   Cervical / Trunk Assessment Cervical / Trunk Assessment: Normal   Communication Communication Communication: No difficulties   Cognition Arousal/Alertness: Awake/alert Behavior During Therapy: WFL for tasks assessed/performed Overall Cognitive Status: Within Functional Limits for tasks assessed                                 General Comments: Pt has tendency to joke around, alert and  oriented, follows commands   General Comments  4L O2, SpO2 >95%, HR in low 60's    Exercises Other Exercises Other Exercises: Pt/spouse instructed in chronic disease mgt strategies to improve adherence to weighing daily, falls prevention, stress mgt, and encouraged seeking out support systems for optimizing emotional and mental well-being   Shoulder Instructions      Home Living Family/patient expects to be discharged to:: Private residence Living Arrangements: Spouse/significant other Available Help at Discharge: Family;Available 24 hours/day Type of Home: House Home Access: Stairs to enter [employer] of Steps: 1 small step Entrance Stairs-Rails: None Home Layout: Two level;Able to live on main level with bedroom/bathroom;Other (Comment) (office is on 2nd floor) Alternate Level Stairs-Number of Steps: 13 Alternate Level Stairs-Rails: Can reach both Bathroom Shower/Tub: Walk-in shower;Other (comment)   Bathroom Toilet: Handicapped height Bathroom Accessibility: Yes How Accessible: Accessible via wheelchair;Accessible via walker Home Equipment: [place] - single point;Walker - 2 wheels;Shower seat - built in;Grab bars - toilet;Grab bars - tub/shower;Other (comment)   Additional Comments: has hurrycane      Prior Functioning/Environment Level of Independence: Needs assistance  Gait / Transfers Assistance Needed: Limited to short household/community distances, no AD, secondary to chronic leg pain, has treadmill and weights that he would lightly workout with intermittently (endoreses being more consistent at the [facility] before COVID/after cardiac rehab), less active recently ADL's / Homemaking Assistance Needed: Pt indep with basic ADL, seated shower, still vacuuming with rest breaks, spouse drives, cooks, both do medications   Comments: Pt/spouse endorse a couple falls in past 12 months        OT Problem List: Impaired balance (sitting and/or standing);Decreased  activity tolerance      OT Treatment/Interventions:      OT Goals(Current goals can be found in the care plan section) Acute Rehab OT Goals Patient Stated Goal: go home OT Goal Formulation: All assessment and education complete, DC therapy  OT Frequency:     Barriers to D/C:            Co-evaluation              AM-PAC OT "6 Clicks" Daily Activity     Outcome Measure Help from another person eating meals?: None Help from another person taking care of personal grooming?: None Help from another person toileting, which includes using toliet, bedpan, or urinal?: None Help from another person bathing (including washing, rinsing, drying)?: None Help from another person to put on and taking off regular upper body clothing?: None Help from another person to put on and taking off regular lower body clothing?: None 6 Click Score: 24   End of Session Equipment Utilized During Treatment: Gait belt;Oxygen  Activity Tolerance: Patient tolerated treatment well Patient left: in bed;with call bell/phone within reach;with family/visitor present (seated EOB at start and end of session)  OT Visit Diagnosis: Other abnormalities of gait and mobility (  R26.89)                Time: 7542-3702 OT Time Calculation (min): 50 min Charges:  OT General Charges $OT Visit: 1 Visit OT Evaluation $OT Eval Low Complexity: 1 Low OT Treatments $Self Care/Home Management : 23-37 mins $Therapeutic Activity: 8-22 mins  Jeni Salles, MPH, MS, OTR/L ascom [phone] 05/03/20, 12:44 PM

## 2020-05-03 NOTE — Progress Notes (Addendum)
Patient with elevated BG >600 g/dL overnight. Does not appear to be in DKA. He is currently receiving steroids which may have exacerbated his blood glucose levels. Ideally would have moved to stepdown for insulin gtt however due to bed situation BG gradually corrected.  Hyperglycemic state -Likely due to steroids and underlying infection. Received one time lantus 10 units last night. He is on 60 units at bedtime.Now  improving with gradual correction (600>528>437>408>349) Hx: DM type II - Adjust Aspart Sliding Scale to resistant - Glucose: q2-4h to titrate insulin - Lab monitoring: q2-4h BMP+Phosphorus+pH (ABG/VBG)  - Increase long acting insulin (Lantus) to 60 units Q24h - Diabetic coordinator consult    Rufina Falco, BSN, MSN, DNP, CCRN,FNP-C  [facility] Nurse Practitioner  [facility]

## 2020-05-03 NOTE — TOC Initial Note (Signed)
Transition of Care [facility]) - Initial/Assessment Note    Patient Details  Name: Ernest Mclean MRN: [id number] Date of Birth: May 02, 1941  Transition of Care [facility]) CM/SW Contact:    Eileen Stanford, LCSW Phone Number: 05/03/2020, 12:24 PM  Clinical Narrative:   Patient states he lives with his spouse. Pt states he gets his 02 through [employer], and was on 2L. However, will be dc on different L flow. Information given to [facility] with [employer]. Pt is refusing HH at this time.               Expected Discharge Plan: Home/Self Care Barriers to Discharge: No Barriers Identified   Patient Goals and CMS Choice Patient states their goals for this hospitalization and ongoing recovery are:: to get better      Expected Discharge Plan and Services Expected Discharge Plan: Home/Self Care In-house Referral: Clinical Social Work   Post Acute Care Choice: NA Living arrangements for the past 2 months: Single Family Home Expected Discharge Date: 05/03/20               DME Arranged: Oxygen DME Agency: [employer] Date DME Agency Contacted: 05/03/20 Time DME Agency Contacted: [id number] Representative spoke with at DME Agency: [email or web address]            Prior Living Arrangements/Services Living arrangements for the past 2 months: [place] Lives with:: Spouse Patient language and need for interpreter reviewed:: Yes Do you feel safe going back to the place where you live?: Yes      Need for Family Participation in Patient Care: Yes (Comment) Care giver support system in place?: Yes (comment)   Criminal Activity/Legal Involvement Pertinent to Current Situation/Hospitalization: No - Comment as needed  Activities of Daily Living Home Assistive Devices/Equipment: None ADL Screening (condition at time of admission) Patient's cognitive ability adequate to safely complete daily activities?: Yes Is the patient deaf or have difficulty hearing?: No Does the patient have difficulty seeing, even when wearing  glasses/contacts?: No Does the patient have difficulty concentrating, remembering, or making decisions?: No Patient able to express need for assistance with ADLs?: Yes Does the patient have difficulty dressing or bathing?: No Independently performs ADLs?: Yes (appropriate for developmental age) Does the patient have difficulty walking or climbing stairs?: No Weakness of Legs: None Weakness of Arms/Hands: None  Permission Sought/Granted Permission sought to share information with : Family Supports    Share Information with NAME: ellen  Permission granted to share info w AGENCY: [employer]  Permission granted to share info w Relationship: spouse     Emotional Assessment Appearance:: Appears stated age Attitude/Demeanor/Rapport: Engaged Affect (typically observed): Accepting,Appropriate Orientation: : Oriented to Self,Oriented to Place,Oriented to  Time,Oriented to Situation Alcohol / Substance Use: Not Applicable Psych Involvement: No (comment)  Admission diagnosis:  COPD exacerbation ([place]) [J44.1] CHF exacerbation ([place]) [I50.9] Acute on chronic congestive heart failure, unspecified heart failure type [facility]) [I50.9] Patient Active Problem List   Diagnosis Date Noted  . CHF exacerbation ([place]) 05/01/2020  . Atrial fibrillation, chronic ([place]) 09/27/2019  . Community acquired pneumonia 09/27/2019  . Acute on chronic respiratory failure with hypoxia ([place]) 09/27/2019  . Elevated troponin 09/27/2019  . Acute metabolic encephalopathy 62/94/7654  . Pneumonia 01/03/2019  . 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 Atherosclerosis of native arteries of extremity with intermittent claudication ([place]) 05/08/2017  . Atherosclerosis of native arteries of the extremities with ulceration ([place]) 03/20/2017  . CKD (chronic kidney disease), stage IIIa 03/20/2017  . Acute on chronic  diastolic CHF (congestive heart failure) ([place]) 08/01/2016  . Anxiety 02/22/2016  . Chronic respiratory failure ([place])  08/10/2015  . Hypoglycemia 05/12/2015  . Type II diabetes mellitus with renal manifestations ([place]) 05/12/2015  . HTN (hypertension) 05/12/2015  . CAD (coronary artery disease) 05/12/2015  . Atrial flutter ([place]) 05/12/2015  . GERD (gastroesophageal reflux disease) 05/12/2015  . Solitary pulmonary nodule 02/10/2015  . OSA on CPAP 08/17/2014  . COPD with acute exacerbation ([place]) 08/17/2014   PCP:  Juluis Pitch, MD Pharmacy:   [facility] [place], [place] [address] [place] [address] Phone: [phone] Fax: [phone]     Social Determinants of Health (SDOH) Interventions    Readmission Risk Interventions No flowsheet data found.

## 2020-05-03 NOTE — Discharge Summary (Signed)
Physician Discharge Summary  Ernest Mclean JAS:[id number] DOB: 05/15/1941 DOA: 05/01/2020  PCP: Juluis Pitch, MD  Admit date: 05/01/2020 Discharge date: 05/03/2020  Admitted From: Home Disposition: Home  Recommendations for Outpatient Follow-up:  1. Follow up with PCP in 1-2 weeks 2. Follow-up with primary cardiologist within 1 to 2 weeks 3. Please obtain BMP/CBC in one week 4. Please follow up with your PCP on the following pending results: Unresulted Labs (From admission, onward)          Start     Ordered   05/02/20 4193  Basic metabolic panel  Daily,   STAT      05/01/20 1538           Home Health: Yes Equipment/Devices: Home oxygen device  Discharge Condition: Stable CODE STATUS: Full code Diet recommendation: Cardiac/low-sodium  Subjective: Patient seen and examined.  His wife at the bedside.  Patient alert and oriented.  He tells me that he feels much better and in fact feels back to his baseline.  He is ready to go home and his wife also agrees with the plan.  XTK:WIOXB A Ringis a 79 y.o.malewith medical history significant ofdiastolic CHF, A. 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 He follows with Dr. Clayborn Bigness in [facility] presents in the emergency department withoneday history of worsening shortness of breath.Patient usually uses 2 L of supplemental oxygen at baseline and uses CPAP at night.Wife reportspatient had a fever yesterday 101.0Fand was given 2 extra strength Tylenol,fever resolved . He reports worsening shortness of breathtoday morning.Hedenied any chest pain,palpitation, dizziness,flank pain,abdominal pain,nausea ,vomiting,diarrhea. His O2 saturation yesterday at home was between 70 to 80%.  ED Course:He was found to be short of breath. He denies any chest pain,palpitations,  dizziness. He is afebrile,hypertensive,heart rate 56, blood pressure 173/66. Heis 98% on 4 L/min. Labs include sodium 141,potassium 3.8,chloride 105,bicarb 27,BUN 36,creatinine 1.73,glucose 252,calcium 8. BNP 358,troponin I86,lactic acid 1.1,WBC 10.1,hemoglobin 11.1,hematocrit 35.8,platelet 174,influenza negative,Covid negative,RSV negative.Chest x-ray:Increased bibasilar atelectasis versus developing pneumonia  Brief/Interim Summary: Patient was admitted to medical service mainly due to acute on chronic hypoxic respiratory failure which was multifactorial, due to acute on chronic diastolic congestive heart failure, acute COPD exacerbation and community-acquired pneumonia.  He was started on Solu-Medrol, IV antibiotics as well as IV Lasix.  His congestive heart failure had improved within 24 hours.  He was switched back to oral Lasix.  He was also seen by cardiology during this hospitalization.  He was also switched to oral prednisone since he improved.  All the cultures remain negative.  Over the course of last 2 days, patient has improved significantly and he feels back to his baseline.  He uses 2 to 3 L of oxygen at home chronically and currently he is requiring only 4 L.  He wants to go home and his wife also agrees with the plan.  He is being discharged in stable condition and he will be prescribed 4 more days of oral prednisone along with Omnicef.  Resume home dose of Lasix which is 20 mg p.o. daily and follow-up with cardiology within 1 to 2 weeks.  Of note, patient also had significant hyperglycemia during hospitalization mainly due to the fact that he was initially given only 10 units of Lantus instead of 50 units that he was taking and he was also on Solu-Medrol.  He was placed back on 60 units of Lantus along with  SSI and his blood sugar is much better today.  He was also evaluated by PT OT who recommended home health but currently patient is not interested in that.  I have  consulted TOC to discuss this with patient.  Home health will be arranged if patient will agree to that.  He is being discharged in stable condition.  Discharge Diagnoses:  Active Problems:   OSA on CPAP   COPD with acute exacerbation (HCC)   Type II diabetes mellitus with renal manifestations (HCC)   HTN (hypertension)   CAD (coronary artery disease)   Atrial flutter (HCC)   GERD (gastroesophageal reflux disease)   Anxiety   Acute on chronic diastolic CHF (congestive heart failure) (HCC)   CKD (chronic kidney disease), stage IIIa   Community acquired pneumonia    Discharge Instructions  Discharge Instructions    Amb Referral to HF Clinic   Complete by: As directed      Allergies as of 05/03/2020      Reactions   Codeine Hives, Rash, Swelling   Hydralazine Other (See Comments)   tongue swollen and couldn't wake up ---- not positive it was this or a mix of this with something else or high sugar   Hydrocodone-acetaminophen Other (See Comments)   Penicillins Other (See Comments)   Passed out (at 79 yrs old) Has patient had a PCN reaction causing immediate rash, facial/tongue/throat swelling, SOB or lightheadedness with hypotension: No Has patient had a PCN reaction causing severe rash involving mucus membranes or skin necrosis: No Has patient had a PCN reaction that required hospitalization No Has patient had a PCN reaction occurring within the last 10 years: No If all of the above answers are "NO", then may proceed with Cephalosporin use.      Medication List    TAKE these medications   albuterol 108 (90 Base) MCG/ACT inhaler Commonly known as: VENTOLIN HFA INHALE 2 PUFFS INTO THE LUNGS EVERY 4 HOURS AS NEEDED FOR WHEEZING OR SHORTNESS OF BREATH   amLODipine 5 MG tablet Commonly known as: NORVASC Take 5 mg by mouth daily.   apixaban 5 MG Tabs tablet Commonly known as: ELIQUIS Take 2.5 mg by mouth 2 (two) times daily.   aspirin EC 81 MG tablet Take 81 mg by mouth  daily.   atorvastatin 20 MG tablet Commonly known as: LIPITOR Take by mouth.   BD Insulin Syringe U/F 31G X 5/16" 0.5 ML Misc Generic drug: Insulin Syringe-Needle U-100   cefdinir 300 MG capsule Commonly known as: OMNICEF Take 1 capsule (300 mg total) by mouth 2 (two) times daily for 4 days.   citalopram 20 MG tablet Commonly known as: CELEXA Take 20 mg by mouth at bedtime.   CRANBERRY PO Take 820 mg by mouth daily.   diazepam 5 MG tablet Commonly known as: VALIUM Take 5 mg by mouth every 12 (twelve) hours as needed for anxiety.   ergocalciferol 1.25 MG (50000 UT) capsule Commonly known as: VITAMIN D2 Take 50,000 Units by mouth every 30 (thirty) days.   Fluticasone-Salmeterol 500-50 MCG/DOSE Aepb Commonly known as: Wixela Inhub Inhale 1 puff into the lungs 2 (two) times daily.   furosemide 20 MG tablet Commonly known as: LASIX Take 20 mg by mouth daily.   HumaLOG 100 UNIT/ML injection Generic drug: insulin lispro Inject 10-25 Units into the skin 3 (three) times daily with meals. (sliding scale as needed to a maximum of 150u daily)   insulin glargine 100 UNIT/ML injection Commonly known as: LANTUS  Inject 60 Units into the skin at bedtime.   lisinopril 5 MG tablet Commonly known as: ZESTRIL Take 5 mg by mouth daily.   metFORMIN 500 MG 24 hr tablet Commonly known as: GLUCOPHAGE-XR Take 1,000 mg by mouth daily with supper.   metoprolol tartrate 100 MG tablet Commonly known as: LOPRESSOR Take 0.5 tablets (50 mg total) by mouth 2 (two) times daily. This is a decrease from your previous 100 mg twice a day.   OMEGA-3 FATTY ACIDS-VITAMIN E PO Take 1 capsule by mouth daily.   omeprazole 20 MG capsule Commonly known as: PRILOSEC Take 20 mg by mouth daily.   pravastatin 20 MG tablet Commonly known as: PRAVACHOL Take 20 mg by mouth every evening.   predniSONE 50 MG tablet Commonly known as: DELTASONE Take 1 tablet (50 mg total) by mouth daily with breakfast for  4 days.   pregabalin 25 MG capsule Commonly known as: LYRICA Take 25 mg by mouth 2 (two) times daily.   tiotropium 18 MCG inhalation capsule Commonly known as: SPIRIVA Place 18 mcg into inhaler and inhale daily.   vitamin B-12 500 MCG tablet Commonly known as: CYANOCOBALAMIN Take 500 mcg by mouth daily.   vitamin C 500 MG tablet Commonly known as: ASCORBIC ACID Take 500 mg by mouth daily.       Follow-up Information    [facility] Follow up on 05/12/2020.   Specialty: Cardiology Why: at 1:00pm. Enter through the [place] entrance Contact information: [place] [phone]       Juluis Pitch, MD Follow up in 1 week(s).   Specialty: Family Medicine Contact information: [address] [place] [address] [phone]              Allergies  Allergen Reactions  . Codeine Hives, Rash and Swelling  . Hydralazine Other (See Comments)    tongue swollen and couldn't wake up ---- not positive it was this or a mix of this with something else or high sugar  . Hydrocodone-Acetaminophen Other (See Comments)  . Penicillins Other (See Comments)    Passed out (at 79 yrs old) Has patient had a PCN reaction causing immediate rash, facial/tongue/throat swelling, SOB or lightheadedness with hypotension: No Has patient had a PCN reaction causing severe rash involving mucus membranes or skin necrosis: No Has patient had a PCN reaction that required hospitalization No Has patient had a PCN reaction occurring within the last 10 years: No If all of the above answers are "NO", then may proceed with Cephalosporin use.     Consultations: Cardiology   Procedures/Studies: DG Chest 2 View  Result Date: 05/01/2020 CLINICAL DATA:  Shortness of breath, O2 requirement, hypertension and diabetes EXAM: CHEST - 2 VIEW COMPARISON:  09/27/2019 FINDINGS: Stable cardiomegaly and previous  median sternotomy for coronary bypass. Similar central vascular congestion. Increased bibasilar ill-defined bronchovascular opacities over the hemidiaphragms compatible with atelectasis or bibasilar bronchopneumonia. No superimposed edema pattern, significant effusion or pneumothorax. Trachea midline. Aorta atherosclerotic. Degenerative changes of the spine. Remote right rib fractures. IMPRESSION: Increased bibasilar atelectasis versus developing pneumonia. Stable cardiomegaly and postoperative findings. Aortic Atherosclerosis (ICD10-I70.0). Electronically Signed   By: Jerilynn Mages.  Shick M.D.   On: 05/01/2020 11:16   ECHOCARDIOGRAM COMPLETE  Result Date: 05/01/2020    ECHOCARDIOGRAM REPORT   Patient Name:   Cezar A Patchell Date of Exam: 05/01/2020 Medical Rec #:  [id number]    Height:  72.5 in Accession #:    [id number]   Weight:       223.1 lb Date of Birth:  May 20, 1941   BSA:          2.243 m Patient Age:    55 years     BP:           173/66 mmHg Patient Gender: M            HR:           63 bpm. Exam Location:  [facility] Procedure: 2D Echo and Intracardiac Opacification Agent Indications:     Dyspnea R06.00  History:         Patient has prior history of Echocardiogram examinations, most                  recent 08/02/2016.  Sonographer:     Arville Go RDCS Referring Phys:  [place] Diagnosing Phys: Isaias Cowman MD  Sonographer Comments: Technically difficult study due to poor echo windows. Image acquisition challenging due to patient body habitus. IMPRESSIONS  1. Left ventricular ejection fraction, by estimation, is 55 to 60%. The left ventricle has normal function. The left ventricle has no regional wall motion abnormalities. Left ventricular diastolic parameters were normal.  2. Right ventricular systolic function is normal. The right ventricular size is normal.  3. The mitral valve is normal in structure. Mild mitral valve regurgitation. No evidence of mitral stenosis.  4. The aortic valve is  normal in structure. Aortic valve regurgitation is not visualized. No aortic stenosis is present.  5. The inferior vena cava is normal in size with greater than 50% respiratory variability, suggesting right atrial pressure of 3 mmHg. FINDINGS  Left Ventricle: Left ventricular ejection fraction, by estimation, is 55 to 60%. The left ventricle has normal function. The left ventricle has no regional wall motion abnormalities. Definity contrast agent was given IV to delineate the left ventricular  endocardial borders. The left ventricular internal cavity size was normal in size. There is no left ventricular hypertrophy. Left ventricular diastolic parameters were normal. Right Ventricle: The right ventricular size is normal. No increase in right ventricular wall thickness. Right ventricular systolic function is normal. Left Atrium: Left atrial size was normal in size. Right Atrium: Right atrial size was normal in size. Pericardium: There is no evidence of pericardial effusion. Mitral Valve: The mitral valve is normal in structure. Mild mitral valve regurgitation. No evidence of mitral valve stenosis. Tricuspid Valve: The tricuspid valve is normal in structure. Tricuspid valve regurgitation is mild . No evidence of tricuspid stenosis. Aortic Valve: The aortic valve is normal in structure. Aortic valve regurgitation is not visualized. No aortic stenosis is present. Aortic valve peak gradient measures 6.0 mmHg. Pulmonic Valve: The pulmonic valve was normal in structure. Pulmonic valve regurgitation is not visualized. No evidence of pulmonic stenosis. Aorta: The aortic root is normal in size and structure. Venous: The inferior vena cava is normal in size with greater than 50% respiratory variability, suggesting right atrial pressure of 3 mmHg. IAS/Shunts: No atrial level shunt detected by color flow Doppler.  LEFT VENTRICLE PLAX 2D LVIDd:         4.39 cm  Diastology LVIDs:         3.10 cm  LV e' medial:    7.72 cm/s LV PW:          1.25 cm  LV E/e' medial:  17.2 LV IVS:        1.48  cm  LV e' lateral:   7.07 cm/s LVOT diam:     2.10 cm  LV E/e' lateral: 18.8 LV SV:         79 LV SV Index:   35 LVOT Area:     3.46 cm  RIGHT VENTRICLE RV Basal diam:  3.33 cm RV S prime:     9.03 cm/s TAPSE (M-mode): 1.6 cm LEFT ATRIUM             Index       RIGHT ATRIUM           Index LA diam:        4.70 cm 2.10 cm/m  RA Area:     14.50 cm LA Vol (A2C):   54.3 ml 24.20 ml/m RA Volume:   34.30 ml  15.29 ml/m LA Vol (A4C):   43.9 ml 19.57 ml/m LA Biplane Vol: 53.2 ml 23.71 ml/m  AORTIC VALVE                PULMONIC VALVE AV Area (Vmax): 2.63 cm    PV Vmax:       0.86 m/s AV Vmax:        122.00 cm/s PV Peak grad:  2.9 mmHg AV Peak Grad:   6.0 mmHg LVOT Vmax:      92.50 cm/s LVOT Vmean:     59.500 cm/s LVOT VTI:       0.229 m  AORTA Ao Root diam: 3.80 cm Ao Asc diam:  3.60 cm MITRAL VALVE                TRICUSPID VALVE MV Area (PHT): 4.54 cm     TV Peak grad:   54.7 mmHg MV Decel Time: 167 msec     TV Vmax:        3.70 m/s MV E velocity: 133.00 cm/s MV A velocity: 52.00 cm/s   SHUNTS MV E/A ratio:  2.56         Systemic VTI:  0.23 m                             Systemic Diam: 2.10 cm Isaias Cowman MD Electronically signed by Isaias Cowman MD Signature Date/Time: 05/01/2020/5:03:26 PM    Final       Discharge Exam: Vitals:   05/03/20 0318 05/03/20 0754  BP: (!) 157/71 138/65  Pulse: 61 61  Resp: 18 16  Temp: 97.6 F (36.4 C) 98.4 F (36.9 C)  SpO2: 99% 98%   Vitals:   05/02/20 2000 05/02/20 2033 05/03/20 0318 05/03/20 0754  BP: (!) 176/116 (!) 172/75 (!) 157/71 138/65  Pulse: 75 82 61 61  Resp: (!) 25 18 18 16   Temp:  (!) 97.4 F (36.3 C) 97.6 F (36.4 C) 98.4 F (36.9 C)  TempSrc:  Oral Oral   SpO2: 96% 95% 99% 98%  Weight:  103.1 kg 103.1 kg   Height:  6' (1.829 m)      General: Pt is alert, awake, not in acute distress Cardiovascular: RRR, S1/S2 +, no rubs, no gallops Respiratory: Bibasilar rhonchi, no  wheezing or crackles. Abdominal: Soft, NT, ND, bowel sounds + Extremities: no edema, no cyanosis    The results of significant diagnostics from this hospitalization (including imaging, microbiology, ancillary and laboratory) are listed below for reference.     Microbiology: Recent Results (from the past 240 hour(s))  Resp Panel by RT-PCR (Flu A&B, Covid) Nasopharyngeal  Swab     Status: None   Collection Time: 05/01/20  1:28 PM   Specimen: Nasopharyngeal Swab; Nasopharyngeal(NP) swabs in vial transport medium  Result Value Ref Range Status   SARS Coronavirus 2 by RT PCR NEGATIVE NEGATIVE Final    Comment: (NOTE) SARS-CoV-2 target nucleic acids are NOT DETECTED.  The SARS-CoV-2 RNA is generally detectable in upper respiratory specimens during the acute phase of infection. The lowest concentration of SARS-CoV-2 viral copies this assay can detect is 138 copies/mL. A negative result does not preclude SARS-Cov-2 infection and should not be used as the sole basis for treatment or other patient management decisions. A negative result may occur with  improper specimen collection/handling, submission of specimen other than nasopharyngeal swab, presence of viral mutation(s) within the areas targeted by this assay, and inadequate number of viral copies(<138 copies/mL). A negative result must be combined with clinical observations, patient history, and epidemiological information. The expected result is Negative.  Fact Sheet for Patients:  EntrepreneurPulse.com.au  Fact Sheet for Healthcare Providers:  IncredibleEmployment.be  This test is no t yet approved or cleared by the [place] FDA and  has been authorized for detection and/or diagnosis of SARS-CoV-2 by FDA under an Emergency Use Authorization (EUA). This EUA will remain  in effect (meaning this test can be used) for the duration of the COVID-19 declaration under Section 564(b)(1) of the Act,  21 U.S.C.section 360bbb-3(b)(1), unless the authorization is terminated  or revoked sooner.       Influenza A by PCR NEGATIVE NEGATIVE Final   Influenza B by PCR NEGATIVE NEGATIVE Final    Comment: (NOTE) The Xpert Xpress SARS-CoV-2/FLU/RSV plus assay is intended as an aid in the diagnosis of influenza from Nasopharyngeal swab specimens and should not be used as a sole basis for treatment. Nasal washings and aspirates are unacceptable for Xpert Xpress SARS-CoV-2/FLU/RSV testing.  Fact Sheet for Patients: EntrepreneurPulse.com.au  Fact Sheet for Healthcare Providers: IncredibleEmployment.be  This test is not yet approved or cleared by the [place] FDA and has been authorized for detection and/or diagnosis of SARS-CoV-2 by FDA under an Emergency Use Authorization (EUA). This EUA will remain in effect (meaning this test can be used) for the duration of the COVID-19 declaration under Section 564(b)(1) of the Act, 21 U.S.C. section 360bbb-3(b)(1), unless the authorization is terminated or revoked.  Performed at [facility], [address] [place], [place] [address]      Labs: BNP (last 3 results) Recent Labs    09/27/19 1043 05/01/20 1023  BNP 382.0* 544.9*   Basic Metabolic Panel: Recent Labs  Lab 05/01/20 1023 05/02/20 0538 05/02/20 2119 05/03/20 0434  NA 141 135  --  137  K 3.8 4.3  --  4.5  CL 105 99  --  100  CO2 27 26  --  26  GLUCOSE 252* 556* 629* 421*  BUN 36* 42*  --  59*  CREATININE 1.73* 1.84*  --  1.95*  CALCIUM 8.8* 8.7*  --  8.7*  MG  --   --   --  2.2   Liver Function Tests: Recent Labs  Lab 05/02/20 0538  AST 18  ALT 15  ALKPHOS 111  BILITOT 1.2  PROT 7.1  ALBUMIN 3.2*   No results for input(s): LIPASE, AMYLASE in the last 168 hours. No results for input(s): AMMONIA in the last 168 hours. CBC: Recent Labs  Lab 05/01/20 1023 05/02/20 0538 05/03/20 0434  WBC 10.1 9.6 15.1*   NEUTROABS  --   --  12.7*  HGB 11.1* 11.5* 11.1*  HCT 35.8* 35.3* 34.9*  MCV 87.3 85.7 86.2  PLT 174 179 201   Cardiac Enzymes: No results for input(s): CKTOTAL, CKMB, CKMBINDEX, TROPONINI in the last 168 hours. BNP: Invalid input(s): POCBNP CBG: Recent Labs  Lab 05/02/20 2342 05/03/20 0204 05/03/20 0416 05/03/20 0649 05/03/20 0823  GLUCAP 528* 437* 408* 349* 271*   D-Dimer No results for input(s): DDIMER in the last 72 hours. Hgb A1c Recent Labs    05/01/20 1802  HGBA1C 7.9*   Lipid Profile No results for input(s): CHOL, HDL, LDLCALC, TRIG, CHOLHDL, LDLDIRECT in the last 72 hours. Thyroid function studies No results for input(s): TSH, T4TOTAL, T3FREE, THYROIDAB in the last 72 hours.  Invalid input(s): FREET3 Anemia work up No results for input(s): VITAMINB12, FOLATE, FERRITIN, TIBC, IRON, RETICCTPCT in the last 72 hours. Urinalysis    Component Value Date/Time   COLORURINE YELLOW (A) 07/09/2015 1532   APPEARANCEUR CLEAR (A) 07/09/2015 1532   LABSPEC 1.009 07/09/2015 1532   PHURINE 5.0 07/09/2015 1532   GLUCOSEU 150 (A) 07/09/2015 1532   HGBUR NEGATIVE 07/09/2015 1532   BILIRUBINUR NEGATIVE 07/09/2015 1532   KETONESUR NEGATIVE 07/09/2015 1532   PROTEINUR 30 (A) 07/09/2015 1532   NITRITE NEGATIVE 07/09/2015 1532   LEUKOCYTESUR 3+ (A) 07/09/2015 1532   Sepsis Labs Invalid input(s): PROCALCITONIN,  WBC,  LACTICIDVEN Microbiology Recent Results (from the past 240 hour(s))  Resp Panel by RT-PCR (Flu A&B, Covid) Nasopharyngeal Swab     Status: None   Collection Time: 05/01/20  1:28 PM   Specimen: Nasopharyngeal Swab; Nasopharyngeal(NP) swabs in vial transport medium  Result Value Ref Range Status   SARS Coronavirus 2 by RT PCR NEGATIVE NEGATIVE Final    Comment: (NOTE) SARS-CoV-2 target nucleic acids are NOT DETECTED.  The SARS-CoV-2 RNA is generally detectable in upper respiratory specimens during the acute phase of infection. The lowest concentration of  SARS-CoV-2 viral copies this assay can detect is 138 copies/mL. A negative result does not preclude SARS-Cov-2 infection and should not be used as the sole basis for treatment or other patient management decisions. A negative result may occur with  improper specimen collection/handling, submission of specimen other than nasopharyngeal swab, presence of viral mutation(s) within the areas targeted by this assay, and inadequate number of viral copies(<138 copies/mL). A negative result must be combined with clinical observations, patient history, and epidemiological information. The expected result is Negative.  Fact Sheet for Patients:  EntrepreneurPulse.com.au  Fact Sheet for Healthcare Providers:  IncredibleEmployment.be  This test is no t yet approved or cleared by the [place] FDA and  has been authorized for detection and/or diagnosis of SARS-CoV-2 by FDA under an Emergency Use Authorization (EUA). This EUA will remain  in effect (meaning this test can be used) for the duration of the COVID-19 declaration under Section 564(b)(1) of the Act, 21 U.S.C.section 360bbb-3(b)(1), unless the authorization is terminated  or revoked sooner.       Influenza A by PCR NEGATIVE NEGATIVE Final   Influenza B by PCR NEGATIVE NEGATIVE Final    Comment: (NOTE) The Xpert Xpress SARS-CoV-2/FLU/RSV plus assay is intended as an aid in the diagnosis of influenza from Nasopharyngeal swab specimens and should not be used as a sole basis for treatment. Nasal washings and aspirates are unacceptable for Xpert Xpress SARS-CoV-2/FLU/RSV testing.  Fact Sheet for Patients: EntrepreneurPulse.com.au  Fact Sheet for Healthcare Providers: IncredibleEmployment.be  This test is not yet approved or cleared by the [place] FDA  and has been authorized for detection and/or diagnosis of SARS-CoV-2 by FDA under an Emergency Use  Authorization (EUA). This EUA will remain in effect (meaning this test can be used) for the duration of the COVID-19 declaration under Section 564(b)(1) of the Act, 21 U.S.C. section 360bbb-3(b)(1), unless the authorization is terminated or revoked.  Performed at [facility], [address] [place], [place] [address]      Time coordinating discharge: Over 30 minutes  SIGNED:   Darliss Cheney, MD  [facility] 05/03/2020, 11:16 AM  If 7PM-7AM, please contact night-coverage [email or web address]

## 2020-05-03 NOTE — Consult Note (Signed)
   Heart Failure Nurse Navigator Note  HFpEF 60-65%.  Grade I diastolic dysfunction.  Mild mitral regurgitation.  Normal right ventricular systolic function.    He presented to the emergency room with complaints of shortness of breath, dyspnea on exertion and a fever of 101.   Comorbidities:  Hypertension Hyperlipidemia COPD with baseline O2 of 2 L Obesity Obstructive sleep apnea with CPAP use Coronary artery disease status post coronary artery bypass grafting Paroxysmal atrial fibrillation Chronic kidney disease   Medications:  Norvasc 5 mg daily Eliquis 2-1/2 mg twice a day Aspirin 81 mg daily Lipitor 20 mg daily Lasix 40 mg daily Zestril 5 mg daily Metoprolol tartrate 50 mg 2 times a day   Labs:  Sodium 137, potassium 4.5, chloride 100, CO2 26, BUN 59 (62), creatinine 1.95(1.84), magnesium 2.2, white blood count 15.1, hemoglobin 11.1, hematocrit 34.9, Intake not documented Output 1250 mL Blood pressure 138/65 with a pulse of 61 BMI 30.84 weight 103.1   Assessment:  General-he is awake and alert lying in bed, wife is at the bedside.  HEENT-pupils are equal and react to light, no JVD.  Cardiac-heart tones of regular rate and rhythm no murmurs or rubs appreciated.   Chest-breath sounds are clear to posterior auscultation.  Abdomen rounded soft nontender.  Musculoskeletal -trace lower extremity edema.  Neurologic-speech is clear moves all extremities without difficulty.  Psych is pleasant and appropriate, makes good eye contact.    Discussed home cares with patient and wife.  He weighs himself daily, and usually when he or she notices that he has put on weight he doubles his Lasix but she has been involved with several family members with health issues and dying was not at home with him and he was eating things that he should not have been eating on with not doubling his water pill like he should have.  She also admits to with all this going on in their  lives that she had gotten some fast food.  She states that she usually makes everything from scratch and counts every little milligram.   Discussed getting him back into the heart failure clinic as an outpatient with Darylene Price NP.  Wife voices understanding as patient decided that he would doing well in the past and did not need to follow-up with Otila Kluver but she is very happy to see that he is getting back into the clinic again.   He is being discharged home, and to follow-up with that patient heart failure clinic on December 22 at 1 PM.  He was given the heart failure teaching booklet along with the heart failure postcard with the appointment written on it.   Pricilla Riffle RN, CHFN

## 2020-05-03 NOTE — Progress Notes (Signed)
[facility] Cardiology    SUBJECTIVE: The patient reports feeling better this morning with no complaints. He denies chest pain or palpitations; he states his breathing and peripheral edema are better. The patient's wife voices concern about discharging the patient too soon.    Vitals:   05/02/20 2000 05/02/20 2033 05/03/20 0318 05/03/20 0754  BP: (!) 176/116 (!) 172/75 (!) 157/71 138/65  Pulse: 75 82 61 61  Resp: (!) 25 18 18 16   Temp:  (!) 97.4 F (36.3 C) 97.6 F (36.4 C) 98.4 F (36.9 C)  TempSrc:  Oral Oral   SpO2: 96% 95% 99% 98%  Weight:  103.1 kg 103.1 kg   Height:  6' (1.829 m)      No intake or output data in the 24 hours ending 05/03/20 0807    PHYSICAL EXAM  General: Well developed, well nourished, in no acute distress, sitting on side of bed HEENT:  Normocephalic and atramatic Neck:  No JVD.  Lungs: Clear bilaterally to auscultation, normal effort of breathing on supplemental oxygen Heart: irregularly irregular . Normal S1 and S2 without gallops or murmurs.  Abdomen: nondistended Msk:  Back normal, gait not assessed. Normal strength and tone for age. Extremities: No clubbing, cyanosis. With trace bilateral pedal edema.   Neuro: Alert and oriented X 3. Psych:  Good affect, responds appropriately   LABS: Basic Metabolic Panel: Recent Labs    05/02/20 0538 05/02/20 2119 05/03/20 0434  NA 135  --  137  K 4.3  --  4.5  CL 99  --  100  CO2 26  --  26  GLUCOSE 556* 629* 421*  BUN 42*  --  59*  CREATININE 1.84*  --  1.95*  CALCIUM 8.7*  --  8.7*  MG  --   --  2.2   Liver Function Tests: Recent Labs    05/02/20 0538  AST 18  ALT 15  ALKPHOS 111  BILITOT 1.2  PROT 7.1  ALBUMIN 3.2*   No results for input(s): LIPASE, AMYLASE in the last 72 hours. CBC: Recent Labs    05/02/20 0538 05/03/20 0434  WBC 9.6 15.1*  NEUTROABS  --  12.7*  HGB 11.5* 11.1*  HCT 35.3* 34.9*  MCV 85.7 86.2  PLT 179 201   Cardiac Enzymes: No results for input(s): CKTOTAL,  CKMB, CKMBINDEX, TROPONINI in the last 72 hours. BNP: Invalid input(s): POCBNP D-Dimer: No results for input(s): DDIMER in the last 72 hours. Hemoglobin A1C: Recent Labs    05/01/20 1802  HGBA1C 7.9*   Fasting Lipid Panel: No results for input(s): CHOL, HDL, LDLCALC, TRIG, CHOLHDL, LDLDIRECT in the last 72 hours. Thyroid Function Tests: No results for input(s): TSH, T4TOTAL, T3FREE, THYROIDAB in the last 72 hours.  Invalid input(s): FREET3 Anemia Panel: No results for input(s): VITAMINB12, FOLATE, FERRITIN, TIBC, IRON, RETICCTPCT in the last 72 hours.  DG Chest 2 View  Result Date: 05/01/2020 CLINICAL DATA:  Shortness of breath, O2 requirement, hypertension and diabetes EXAM: CHEST - 2 VIEW COMPARISON:  09/27/2019 FINDINGS: Stable cardiomegaly and previous median sternotomy for coronary bypass. Similar central vascular congestion. Increased bibasilar ill-defined bronchovascular opacities over the hemidiaphragms compatible with atelectasis or bibasilar bronchopneumonia. No superimposed edema pattern, significant effusion or pneumothorax. Trachea midline. Aorta atherosclerotic. Degenerative changes of the spine. Remote right rib fractures. IMPRESSION: Increased bibasilar atelectasis versus developing pneumonia. Stable cardiomegaly and postoperative findings. Aortic Atherosclerosis (ICD10-I70.0). Electronically Signed   By: Jerilynn Mages.  Shick M.D.   On: 05/01/2020 11:16   ECHOCARDIOGRAM  COMPLETE  Result Date: 05/01/2020    ECHOCARDIOGRAM REPORT   Patient Name:   Ernest Mclean Date of Exam: 05/01/2020 Medical Rec #:  [id number]    Height:       72.5 in Accession #:    [id number]   Weight:       223.1 lb Date of Birth:  15-Apr-1941   BSA:          2.243 m Patient Age:    79 years     BP:           173/66 mmHg Patient Gender: M            HR:           63 bpm. Exam Location:  [facility] Procedure: 2D Echo and Intracardiac Opacification Agent Indications:     Dyspnea R06.00  History:         Patient has prior  history of Echocardiogram examinations, most                  recent 08/02/2016.  Sonographer:     Arville Go RDCS Referring Phys:  [place] Diagnosing Phys: Isaias Cowman MD  Sonographer Comments: Technically difficult study due to poor echo windows. Image acquisition challenging due to patient body habitus. IMPRESSIONS  1. Left ventricular ejection fraction, by estimation, is 55 to 60%. The left ventricle has normal function. The left ventricle has no regional wall motion abnormalities. Left ventricular diastolic parameters were normal.  2. Right ventricular systolic function is normal. The right ventricular size is normal.  3. The mitral valve is normal in structure. Mild mitral valve regurgitation. No evidence of mitral stenosis.  4. The aortic valve is normal in structure. Aortic valve regurgitation is not visualized. No aortic stenosis is present.  5. The inferior vena cava is normal in size with greater than 50% respiratory variability, suggesting right atrial pressure of 3 mmHg. FINDINGS  Left Ventricle: Left ventricular ejection fraction, by estimation, is 55 to 60%. The left ventricle has normal function. The left ventricle has no regional wall motion abnormalities. Definity contrast agent was given IV to delineate the left ventricular  endocardial borders. The left ventricular internal cavity size was normal in size. There is no left ventricular hypertrophy. Left ventricular diastolic parameters were normal. Right Ventricle: The right ventricular size is normal. No increase in right ventricular wall thickness. Right ventricular systolic function is normal. Left Atrium: Left atrial size was normal in size. Right Atrium: Right atrial size was normal in size. Pericardium: There is no evidence of pericardial effusion. Mitral Valve: The mitral valve is normal in structure. Mild mitral valve regurgitation. No evidence of mitral valve stenosis. Tricuspid Valve: The tricuspid valve is normal  in structure. Tricuspid valve regurgitation is mild . No evidence of tricuspid stenosis. Aortic Valve: The aortic valve is normal in structure. Aortic valve regurgitation is not visualized. No aortic stenosis is present. Aortic valve peak gradient measures 6.0 mmHg. Pulmonic Valve: The pulmonic valve was normal in structure. Pulmonic valve regurgitation is not visualized. No evidence of pulmonic stenosis. Aorta: The aortic root is normal in size and structure. Venous: The inferior vena cava is normal in size with greater than 50% respiratory variability, suggesting right atrial pressure of 3 mmHg. IAS/Shunts: No atrial level shunt detected by color flow Doppler.  LEFT VENTRICLE PLAX 2D LVIDd:         4.39 cm  Diastology LVIDs:         3.10  cm  LV e' medial:    7.72 cm/s LV PW:         1.25 cm  LV E/e' medial:  17.2 LV IVS:        1.48 cm  LV e' lateral:   7.07 cm/s LVOT diam:     2.10 cm  LV E/e' lateral: 18.8 LV SV:         79 LV SV Index:   35 LVOT Area:     3.46 cm  RIGHT VENTRICLE RV Basal diam:  3.33 cm RV S prime:     9.03 cm/s TAPSE (M-mode): 1.6 cm LEFT ATRIUM             Index       RIGHT ATRIUM           Index LA diam:        4.70 cm 2.10 cm/m  RA Area:     14.50 cm LA Vol (A2C):   54.3 ml 24.20 ml/m RA Volume:   34.30 ml  15.29 ml/m LA Vol (A4C):   43.9 ml 19.57 ml/m LA Biplane Vol: 53.2 ml 23.71 ml/m  AORTIC VALVE                PULMONIC VALVE AV Area (Vmax): 2.63 cm    PV Vmax:       0.86 m/s AV Vmax:        122.00 cm/s PV Peak grad:  2.9 mmHg AV Peak Grad:   6.0 mmHg LVOT Vmax:      92.50 cm/s LVOT Vmean:     59.500 cm/s LVOT VTI:       0.229 m  AORTA Ao Root diam: 3.80 cm Ao Asc diam:  3.60 cm MITRAL VALVE                TRICUSPID VALVE MV Area (PHT): 4.54 cm     TV Peak grad:   54.7 mmHg MV Decel Time: 167 msec     TV Vmax:        3.70 m/s MV E velocity: 133.00 cm/s MV A velocity: 52.00 cm/s   SHUNTS MV E/A ratio:  2.56         Systemic VTI:  0.23 m                             Systemic Diam:  2.10 cm Isaias Cowman MD Electronically signed by Isaias Cowman MD Signature Date/Time: 05/01/2020/5:03:26 PM    Final      Echo: LVEF 55-60%  TELEMETRY: atrial fibrillation 60-70s bom  ASSESSMENT AND PLAN:  Principal Problem:   CHF exacerbation (HCC) Active Problems:   OSA on CPAP   COPD (chronic obstructive pulmonary disease) (HCC)   Type II diabetes mellitus with renal manifestations (HCC)   HTN (hypertension)   CAD (coronary artery disease)   Atrial flutter (HCC)   GERD (gastroesophageal reflux disease)   Chronic respiratory failure (HCC)   Anxiety   Chronic diastolic CHF (congestive heart failure) (HCC)   CKD (chronic kidney disease), stage IIIa     1.  Acute on chronic diastolic congestive heart failure, likely due to dietary noncompliance, improvement after initial dose of furosemide 40 mg IV with diuresis, 2D echocardiogram 05/01/2020 revealed LV ejection fraction 55 to 60% 2.  Borderline elevated troponin (106,190), the absence of chest pain or evidence for myocardial ischemia, most likely demand supply secondary to acute on chronic diastolic CHF (  I21.A1) 3.  Paroxysmal atrial fibrillation, rate controlled, on Eliquis for stroke prevention 4.  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  COPD exacerbation, with Solu-Medrol, and DuoNeb every 4 hours  Recommendations: 1. Continue IV Lasix with careful monitoring of renal status 2. Continue metoprolol tartrate 50 mg BID for rate control 3. Management of COPD exacerbation per hospitalist 4. Recommend ambulating patient today to assess oxygen saturation  5. Defer further cardiac diagnostics at this time   Clabe Seal, Hershal Coria 05/03/2020 8:07 AM

## 2020-05-03 NOTE — Evaluation (Signed)
Physical Therapy Evaluation Patient Details Name: Ernest Mclean MRN: [id number] DOB: [birth year] Today's Date: 05/03/2020   History of Present Illness  79 y.o. male with medical history significant of diastolic CHF, A. fib on Eliquis, diabetes mellitus, GERD, hypertension, CKD stage IIIa , COPD on 2 L supplemental oxygen at baseline,  obesity, obstructive sleep apnea on CPAP,  CAD s/p CABG. presents in the emergency department with one day history of worsening shortness of breath.  Patient usually uses 2 L of supplemental oxygen at baseline and uses CPAP at night.  Wife reports patient had a fever yesterday 101.66F and was given 2 extra strength Tylenol,  fever resolved . He reports worsening shortness of breath today morning. He denied any chest pain, palpitation, dizziness,  flank pain,  abdominal pain, nausea ,vomiting, diarrhea.  His O2 saturation yesterday at home was between 70 to 80%.    Clinical Impression  Pt was pleasant and motivated to participate during the session.  Pt found on 3LO2/min with SpO2 in the mid 90s.  Pt's SpO2 dropped to a low of 86% during the session as he repositioned himself at the EOB but quickly returned to baseline with cues for PLB.  Pt's SpO2 did not drop below 91% during the remainder of the session including during graded ambulation and mostly remained in the mid to upper 90s.  Pt reported no adverse symptoms during the session and was steady with ambulation without an AD.  Pt did endorse multiple falls in the last year however.  Pt will benefit from HHPT services upon discharge to safely address deficits listed in patient problem list for decreased caregiver assistance and decreased risk of further functional decline and falls.        Follow Up Recommendations Home health PT;Supervision - Intermittent    Equipment Recommendations  None recommended by PT    Recommendations for Other Services       Precautions / Restrictions Precautions Precautions:  Fall Restrictions Weight Bearing Restrictions: No      Mobility  Bed Mobility Overal bed mobility: Modified Independent             General bed mobility comments: NT, pt sitting up pre/post session, Mod Ind with OT    Transfers Overall transfer level: Independent               General transfer comment: Good concentric control but fair at best eccentric control with pt unable to control the last few inches of descent during stand to sit  Ambulation/Gait Ambulation/Gait assistance: Supervision Gait Distance (Feet): 100 Feet Assistive device: None Gait Pattern/deviations: Step-through pattern;Decreased step length - right;Decreased step length - left Gait velocity: decreased   General Gait Details: Slow cadence but steady without instability/LOB including during 180 deg turns and amb in tight spaces  Stairs            Wheelchair Mobility    Modified Rankin (Stroke Patients Only)       Balance Overall balance assessment: Needs assistance;History of Falls Sitting-balance support: No upper extremity supported;Single extremity supported;Feet supported Sitting balance-Leahy Scale: Normal     Standing balance support: No upper extremity supported;During functional activity Standing balance-Leahy Scale: Good                               Pertinent Vitals/Pain Pain Assessment: No/denies pain    Home Living Family/patient expects to be discharged to:: Private residence Living Arrangements: Spouse/significant other Available  Help at Discharge: Family;Available 24 hours/day Type of Home: House Home Access: Stairs to enter Entrance Stairs-Rails: None Entrance Stairs-Number of Steps: 1 small step Home Layout: Two level;Able to live on main level with bedroom/bathroom;Other (Comment) Home Equipment: Cane - single point;Walker - 2 wheels;Shower seat - built in;Grab bars - toilet;Grab bars - tub/shower;Other (comment) Additional Comments: has  hurrycane    Prior Function Level of Independence: Needs assistance   Gait / Transfers Assistance Needed: Limited to short household/community distances, no AD, secondary to chronic leg pain, has treadmill and weights that he would lightly workout with intermittently (endoreses being more consistent at the [facility] before COVID/after cardiac rehab), less active recently  ADL's / Homemaking Assistance Needed: Pt indep with basic ADL, seated shower, still vacuuming with rest breaks, spouse drives, cooks, both do medications  Comments: Pt/spouse endorse a couple falls in past 12 months     Hand Dominance   Dominant Hand: Left (ambidextrous)    Extremity/Trunk Assessment   Upper Extremity Assessment Upper Extremity Assessment: Overall WFL for tasks assessed    Lower Extremity Assessment Lower Extremity Assessment: Generalized weakness    Cervical / Trunk Assessment Cervical / Trunk Assessment: Normal  Communication   Communication: No difficulties  Cognition Arousal/Alertness: Awake/alert Behavior During Therapy: WFL for tasks assessed/performed Overall Cognitive Status: Within Functional Limits for tasks assessed                                 General Comments: Pt has tendency to joke around, alert and oriented, follows commands      General Comments General comments (skin integrity, edema, etc.): 4L O2, SpO2 >95%, HR in low 60's    Exercises Other Exercises Other Exercises: Cues/training provided on technique for improved eccentric control during stand to sit Other Exercises: Pt education provided on principles of activity progression and energy conservation   Assessment/Plan    PT Assessment Patient needs continued PT services  PT Problem List Decreased balance;Decreased activity tolerance       PT Treatment Interventions DME instruction;Gait training;Stair training;Functional mobility training;Therapeutic activities;Therapeutic exercise;Balance  training;Patient/family education    PT Goals (Current goals can be found in the Care Plan section)  Acute Rehab PT Goals Patient Stated Goal: To return home PT Goal Formulation: With patient Time For Goal Achievement: 05/16/20 Potential to Achieve Goals: Good    Frequency Min 2X/week   Barriers to discharge        Co-evaluation               AM-PAC PT "6 Clicks" Mobility  Outcome Measure Help needed turning from your back to your side while in a flat bed without using bedrails?: None Help needed moving from lying on your back to sitting on the side of a flat bed without using bedrails?: None Help needed moving to and from a bed to a chair (including a wheelchair)?: None Help needed standing up from a chair using your arms (e.g., wheelchair or bedside chair)?: None Help needed to walk in hospital room?: A Little Help needed climbing 3-5 steps with a railing? : A Little 6 Click Score: 22    End of Session Equipment Utilized During Treatment: Gait belt;Oxygen Activity Tolerance: Patient tolerated treatment well Patient left: in bed;with family/visitor present;with call bell/phone within reach Nurse Communication: Mobility status;Other (comment) (SpO2 to a low of 86% this session) PT Visit Diagnosis: History of falling (Z91.81);Repeated falls (R29.6);Difficulty in walking, not  elsewhere classified (R26.2)    Time: 6387-5643 PT Time Calculation (min) (ACUTE ONLY): 32 min   Charges:   PT Evaluation $PT Eval Moderate Complexity: 1 Mod PT Treatments $Therapeutic Activity: 8-22 mins        D. Scott Maryum Batterson PT, DPT 05/03/20, 1:19 PM

## 2020-05-12 ENCOUNTER — Other Ambulatory Visit: Payer: Self-pay

## 2020-05-12 ENCOUNTER — Ambulatory Visit: Payer: Medicare PPO | Admitting: Family

## 2020-05-12 ENCOUNTER — Encounter: Payer: Self-pay | Admitting: Family

## 2020-05-12 VITALS — BP 150/61 | HR 66 | Resp 18 | Ht 72.0 in | Wt 223.0 lb

## 2020-05-12 DIAGNOSIS — Z885 Allergy status to narcotic agent status: Secondary | ICD-10-CM | POA: Insufficient documentation

## 2020-05-12 DIAGNOSIS — Z951 Presence of aortocoronary bypass graft: Secondary | ICD-10-CM | POA: Insufficient documentation

## 2020-05-12 DIAGNOSIS — R059 Cough, unspecified: Secondary | ICD-10-CM | POA: Diagnosis not present

## 2020-05-12 DIAGNOSIS — I130 Hypertensive heart and chronic kidney disease with heart failure and stage 1 through stage 4 chronic kidney disease, or unspecified chronic kidney disease: Secondary | ICD-10-CM | POA: Insufficient documentation

## 2020-05-12 DIAGNOSIS — J449 Chronic obstructive pulmonary disease, unspecified: Secondary | ICD-10-CM | POA: Insufficient documentation

## 2020-05-12 DIAGNOSIS — N189 Chronic kidney disease, unspecified: Secondary | ICD-10-CM | POA: Insufficient documentation

## 2020-05-12 DIAGNOSIS — F419 Anxiety disorder, unspecified: Secondary | ICD-10-CM | POA: Insufficient documentation

## 2020-05-12 DIAGNOSIS — Z880 Allergy status to penicillin: Secondary | ICD-10-CM | POA: Insufficient documentation

## 2020-05-12 DIAGNOSIS — Z7901 Long term (current) use of anticoagulants: Secondary | ICD-10-CM | POA: Insufficient documentation

## 2020-05-12 DIAGNOSIS — G4733 Obstructive sleep apnea (adult) (pediatric): Secondary | ICD-10-CM

## 2020-05-12 DIAGNOSIS — E1122 Type 2 diabetes mellitus with diabetic chronic kidney disease: Secondary | ICD-10-CM

## 2020-05-12 DIAGNOSIS — I5032 Chronic diastolic (congestive) heart failure: Secondary | ICD-10-CM

## 2020-05-12 DIAGNOSIS — E1151 Type 2 diabetes mellitus with diabetic peripheral angiopathy without gangrene: Secondary | ICD-10-CM | POA: Insufficient documentation

## 2020-05-12 DIAGNOSIS — Z794 Long term (current) use of insulin: Secondary | ICD-10-CM | POA: Insufficient documentation

## 2020-05-12 DIAGNOSIS — Z7982 Long term (current) use of aspirin: Secondary | ICD-10-CM | POA: Insufficient documentation

## 2020-05-12 DIAGNOSIS — Z79899 Other long term (current) drug therapy: Secondary | ICD-10-CM | POA: Insufficient documentation

## 2020-05-12 DIAGNOSIS — I10 Essential (primary) hypertension: Secondary | ICD-10-CM

## 2020-05-12 DIAGNOSIS — I2510 Atherosclerotic heart disease of native coronary artery without angina pectoris: Secondary | ICD-10-CM | POA: Insufficient documentation

## 2020-05-12 DIAGNOSIS — Z87891 Personal history of nicotine dependence: Secondary | ICD-10-CM | POA: Insufficient documentation

## 2020-05-12 DIAGNOSIS — R0602 Shortness of breath: Secondary | ICD-10-CM | POA: Insufficient documentation

## 2020-05-12 NOTE — Progress Notes (Signed)
Patient ID: Ernest Mclean, male    DOB: Apr 28, 1941, 79 y.o.   MRN: [id number]  HPI  Ernest Mclean is a 79 y/o male with a history of Atrial fibrillation, CAD, CKD, COPD, diabetes, HTN, PVD, PAD, obstructive sleep apnea (on CPAP), remote tobacco use and chronic HF with preserved EF.  Echo report from 05/01/20 reviewed and showed an EF of 55-60% along with mild Ernest. Echo done 08/02/16 reviewed and showed an EF of 60-65% along with mild Ernest/TR. Previous echo was done 07/09/15 which showed an EF of 45-50% with trivial Ernest. No aortic stenosis. EF is down from June 2016 when it was >55%. Last PFT's done September 2016 with recent 6 minute walk test completed by pulmonology on 02/02/16.   Admitted 05/01/20 due to acute on chronic HF, COPD exacerbation and pneumonia. Cardiology consult obtained. Given solu-medrol, antibiotics and IV lasix initially with transition to oral medications. Blood cultures were negative. PT/ OT evaluations done. Discharged after 2 days.   He presents today for a follow-up visit although hasn't been seen since February 2019. He presents with a chief complaint of moderate shortness of breath upon moderate exertion. He describes this as chronic in nature having been present for several years with varying levels of severity. He has associated fatigue, pedal edema and anxiety along with this. He denies any difficulty sleeping, dizziness, abdominal distention, palpitations, chest pain, cough or weight gain.   Just yesterday he had his lisinopril increased to 10mg  BID. Did take an extra furosemide yesterday and again today due to swelling.   Past Medical History:  Diagnosis Date  . Atrial fibrillation (HCC)   . Basal cell carcinoma 06/2017   Left Ear  . CAD (coronary artery disease)   . CKD (chronic kidney disease)   . Congestive heart failure (HCC)   . COPD (chronic obstructive pulmonary disease) (HCC)   . Diabetes (HCC)   . Hypertension   . OSA on CPAP   . 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 CLAVICLE SURGERY    . heart bypass    . HERNIA REPAIR    . MOHS SURGERY  07/10/2017   Head   Family History  Problem Relation Age of Onset  . Stroke Other   . Diabetes Other   . Breast cancer Other   . Colon cancer Other    Social History   Tobacco Use  . Smoking status: Former Smoker    Packs/day: 1.00    Years: 30.00    Pack years: 30.00    Types: Cigarettes    Quit date: 12/14/2001    Years since quitting: 18.4  . Smokeless tobacco: Never Used  . Tobacco comment: quit 12/14/2001  Substance Use Topics  . Alcohol use: No    Alcohol/week: 0.0 standard drinks   Allergies  Allergen Reactions  . Codeine Hives, Rash and Swelling  . Hydralazine Other (See Comments)    tongue swollen and couldn't wake up ---- not positive it was this or a mix of this with something else or high sugar  . Hydrocodone-Acetaminophen Other (See Comments)  . Penicillins Other (See Comments)    Passed out (at 79 yrs old) Has patient had a PCN reaction causing immediate rash, facial/tongue/throat swelling, SOB or lightheadedness with hypotension: No Has patient had a PCN reaction causing severe rash involving mucus membranes or skin necrosis: No Has patient had a PCN reaction that required hospitalization No  Has patient had a PCN reaction occurring within the last 10 years: No If all of the above answers are "NO", then may proceed with Cephalosporin use.    Prior to Admission medications   Medication Sig Start Date End Date Taking? Authorizing Provider  albuterol (VENTOLIN HFA) 108 (90 Base) MCG/ACT inhaler INHALE 2 PUFFS INTO THE LUNGS EVERY 4 HOURS AS NEEDED FOR WHEEZING OR SHORTNESS OF BREATH 04/18/20  Yes Ernest Mclean, Ernest Bees, MD  amLODipine (NORVASC) 5 MG tablet Take 5 mg by mouth daily.  08/17/14  Yes [provider]  apixaban (ELIQUIS) 5 MG TABS tablet Take 2.5 mg by mouth 2 (two) times daily.    Yes [provider]  aspirin EC 81 MG tablet Take 81 mg by mouth daily. 08/17/14  Yes [provider]  atorvastatin (LIPITOR) 20 MG tablet Take by mouth. 10/08/19  Yes [provider]  BD INSULIN SYRINGE U/F 31G X 5/16" 0.5 ML MISC  10/10/19  Yes [provider]  citalopram (CELEXA) 20 MG tablet Take 20 mg by mouth at bedtime.  07/30/14  Yes [provider]  CRANBERRY PO Take 820 mg by mouth daily.   Yes [provider]  cyanocobalamin 500 MCG tablet Take 500 mcg by mouth daily.   Yes [provider]  diazepam (VALIUM) 5 MG tablet Take 5 mg by mouth every 12 (twelve) hours as needed for anxiety.  10/24/13  Yes [provider]  ergocalciferol (VITAMIN D2) 50000 units capsule Take 50,000 Units by mouth every 30 (thirty) days.   Yes [provider]  Fluticasone-Salmeterol (WIXELA INHUB) 500-50 MCG/DOSE AEPB Inhale 1 puff into the lungs 2 (two) times daily. 01/12/20  Yes Ernest Lipps, MD  furosemide (LASIX) 20 MG tablet Take 20 mg by mouth daily.    Yes [provider]  HUMALOG 100 UNIT/ML injection Inject 10-25 Units into the skin 3 (three) times daily with meals. (sliding scale as needed to a maximum of 150u daily) 06/24/19  Yes [provider]  insulin glargine (LANTUS) 100 UNIT/ML injection Inject 60 Units into the skin at bedtime.    Yes [provider]  lisinopril (PRINIVIL,ZESTRIL) 5 MG tablet Take 10 mg by mouth in the morning and at bedtime.   Yes [provider]  metFORMIN (GLUCOPHAGE-XR) 500 MG 24 hr tablet Take 1,000 mg by mouth daily with supper.   Yes [provider]  metoprolol tartrate (LOPRESSOR) 100 MG tablet Take 0.5 tablets (50 mg total) by mouth 2 (two) times daily. This is a decrease from your previous 100 mg twice a day. 10/02/19  Yes Ernest Bi, MD  OMEGA-3 FATTY ACIDS-VITAMIN E PO Take 1 capsule by mouth daily.   Yes [provider]  omeprazole (PRILOSEC) 20 MG capsule Take 20 mg by  mouth daily.   Yes [provider]  pravastatin (PRAVACHOL) 20 MG tablet Take 20 mg by mouth every evening.   Yes [provider]  pregabalin (LYRICA) 25 MG capsule Take 25 mg by mouth 2 (two) times daily.   Yes [provider]  tiotropium (SPIRIVA) 18 MCG inhalation capsule Place 18 mcg into inhaler and inhale daily. 07/31/14  Yes [provider]  vitamin C (ASCORBIC ACID) 500 MG tablet Take 500 mg by mouth daily.   Yes [provider]    Review of Systems  Constitutional: Positive for fatigue (with overexertion). Negative for appetite change.  HENT: Negative for congestion, postnasal drip and voice change.   Eyes: Negative.  Respiratory: Positive for shortness of breath. Negative for cough and chest tightness.   Cardiovascular: Positive for leg swelling (right foot). Negative for chest pain and palpitations.  Gastrointestinal: Negative for abdominal distention and abdominal pain.  Endocrine: Negative.   Genitourinary: Negative.   Musculoskeletal: Negative for back pain and neck pain.  Skin: Negative.   Allergic/Immunologic: Negative.   Neurological: Negative for dizziness and light-headedness.  Hematological: Negative for adenopathy. Does not bruise/bleed easily.  Psychiatric/Behavioral: Negative for dysphoric mood and sleep disturbance (wearing oxygen and CPAP at bedtime). The patient is nervous/anxious.    Vitals:   05/12/20 1314  BP: (!) 150/61  Pulse: 66  Resp: 18  SpO2: 100%  Weight: 223 lb (101.2 kg)  Height: 6' (1.829 m)   Wt Readings from Last 3 Encounters:  05/12/20 223 lb (101.2 kg)  05/03/20 227 lb 6.4 oz (103.1 kg)  01/14/20 223 lb 1.6 oz (101.2 kg)   Lab Results  Component Value Date   CREATININE 1.95 (H) 05/03/2020   CREATININE 1.84 (H) 05/02/2020   CREATININE 1.73 (H) 05/01/2020    Physical Exam Vitals and nursing note reviewed. Exam conducted with a chaperone present (wife present in room).  Constitutional:       Appearance: He is well-developed.  HENT:     Head: Normocephalic and atraumatic.  Neck:     Vascular: No JVD.  Cardiovascular:     Rate and Rhythm: Normal rate and regular rhythm.  Pulmonary:     Effort: Pulmonary effort is normal.     Breath sounds: No wheezing or rales.  Abdominal:     General: There is no distension.     Palpations: Abdomen is soft.     Tenderness: There is no abdominal tenderness.  Musculoskeletal:        General: No tenderness.     Cervical back: Normal range of motion and neck supple.     Right lower leg: Edema (1+ pitting) present.     Left lower leg: Edema (1+ pitting) present.  Skin:    General: Skin is warm and dry.  Neurological:     General: No focal deficit present.     Mental Status: He is alert and oriented to person, place, and time.  Psychiatric:        Behavior: Behavior normal.        Thought Content: Thought content normal.     Assessment & Plan:  1: Chronic heart failure with preserved ejection fraction without structural changes- - NYHA Class II - Euvolemic today - Continue daily weights; reminded to call for an overnight weight gain of >2 pounds/weekly weight gain of >5 pounds - double furosemide for next 2 days and call back if edema persists - wear compression socks daily with removal at bedtime - elevate legs at least 3 times/ day for ~ 20 minutes each time - not adding salt and patient's wife diligently reads food labels for sodium content - saw cardiologist Ernest Mclean) 02/09/20 - patient reports receiving his flu vaccine for the season - BNP 05/01/20 was 358.1  2: HTN- - BP mildly elevated; lisinopril just increased by PCP yesterday to 10mg  BID - Saw PCP (Ernest Mclean) on 05/11/20 - BMP from 05/07/20 reviewed and shows sodium 138, potassium 4.4, creatinine 1.7 and GFR 39  3: Obstructive sleep apnea- - Wearing CPAP on a nightly basis - Wears oxygen at 2L [place]  4: Diabetes- - nonfasting glucose at home today was 387 -  saw endocrinologist (Ernest Mclean) 10/28/19 - saw nephrologist (  Ernest Mclean) 12/17/19 - A1c 05/01/20 was 7.9%   Patient did not bring his medications nor a list. Each medication was verbally reviewed with the patient and he was encouraged to bring the bottles to every visit to confirm accuracy of list.  Return in 2 months or sooner for any questions/problems before then.

## 2020-05-12 NOTE — Patient Instructions (Addendum)
Continue weighing daily and call for an overnight weight gain of > 2 pounds or a weekly weight gain of >5 pounds.   Take 2 fluid pills (lasix) tomorrow and Friday.   Wear compression socks daily with removal at bedtime.   Elevate legs when sitting for long periods of time. At least 3 times a day for 20 minutes each time  Walk a few minutes every hour.    Call us Monday if swelling persists/ worsens or if your weight increases.

## 2020-05-14 ENCOUNTER — Emergency Department: Payer: Medicare PPO

## 2020-05-14 ENCOUNTER — Other Ambulatory Visit: Payer: Self-pay

## 2020-05-14 ENCOUNTER — Inpatient Hospital Stay
Admission: EM | Admit: 2020-05-14 | Discharge: 2020-05-20 | DRG: 280 | Disposition: A | Discharge status: Home Health | Payer: Medicare PPO | Attending: Internal Medicine | Admitting: Internal Medicine

## 2020-05-14 DIAGNOSIS — I5033 Acute on chronic diastolic (congestive) heart failure: Secondary | ICD-10-CM

## 2020-05-14 DIAGNOSIS — Z66 Do not resuscitate: Secondary | ICD-10-CM | POA: Diagnosis present

## 2020-05-14 DIAGNOSIS — I130 Hypertensive heart and chronic kidney disease with heart failure and stage 1 through stage 4 chronic kidney disease, or unspecified chronic kidney disease: Secondary | ICD-10-CM | POA: Diagnosis present

## 2020-05-14 DIAGNOSIS — R059 Cough, unspecified: Secondary | ICD-10-CM

## 2020-05-14 DIAGNOSIS — I214 Non-ST elevation (NSTEMI) myocardial infarction: Secondary | ICD-10-CM | POA: Diagnosis present

## 2020-05-14 DIAGNOSIS — Z833 Family history of diabetes mellitus: Secondary | ICD-10-CM

## 2020-05-14 DIAGNOSIS — E1142 Type 2 diabetes mellitus with diabetic polyneuropathy: Secondary | ICD-10-CM | POA: Diagnosis present

## 2020-05-14 DIAGNOSIS — Z880 Allergy status to penicillin: Secondary | ICD-10-CM | POA: Diagnosis not present

## 2020-05-14 DIAGNOSIS — E669 Obesity, unspecified: Secondary | ICD-10-CM | POA: Diagnosis present

## 2020-05-14 DIAGNOSIS — D631 Anemia in chronic kidney disease: Secondary | ICD-10-CM | POA: Diagnosis present

## 2020-05-14 DIAGNOSIS — G4733 Obstructive sleep apnea (adult) (pediatric): Secondary | ICD-10-CM

## 2020-05-14 DIAGNOSIS — Z803 Family history of malignant neoplasm of breast: Secondary | ICD-10-CM

## 2020-05-14 DIAGNOSIS — E785 Hyperlipidemia, unspecified: Secondary | ICD-10-CM | POA: Diagnosis present

## 2020-05-14 DIAGNOSIS — Z6831 Body mass index (BMI) 31.0-31.9, adult: Secondary | ICD-10-CM

## 2020-05-14 DIAGNOSIS — I5043 Acute on chronic combined systolic (congestive) and diastolic (congestive) heart failure: Secondary | ICD-10-CM | POA: Diagnosis present

## 2020-05-14 DIAGNOSIS — Z888 Allergy status to other drugs, medicaments and biological substances status: Secondary | ICD-10-CM

## 2020-05-14 DIAGNOSIS — Z7901 Long term (current) use of anticoagulants: Secondary | ICD-10-CM | POA: Diagnosis not present

## 2020-05-14 DIAGNOSIS — Z85828 Personal history of other malignant neoplasm of skin: Secondary | ICD-10-CM

## 2020-05-14 DIAGNOSIS — E1122 Type 2 diabetes mellitus with diabetic chronic kidney disease: Secondary | ICD-10-CM | POA: Diagnosis present

## 2020-05-14 DIAGNOSIS — E1151 Type 2 diabetes mellitus with diabetic peripheral angiopathy without gangrene: Secondary | ICD-10-CM | POA: Diagnosis present

## 2020-05-14 DIAGNOSIS — I455 Other specified heart block: Secondary | ICD-10-CM | POA: Diagnosis not present

## 2020-05-14 DIAGNOSIS — N1832 Chronic kidney disease, stage 3b: Secondary | ICD-10-CM | POA: Diagnosis not present

## 2020-05-14 DIAGNOSIS — Z794 Long term (current) use of insulin: Secondary | ICD-10-CM | POA: Diagnosis not present

## 2020-05-14 DIAGNOSIS — I10 Essential (primary) hypertension: Secondary | ICD-10-CM | POA: Diagnosis present

## 2020-05-14 DIAGNOSIS — I482 Chronic atrial fibrillation, unspecified: Secondary | ICD-10-CM | POA: Diagnosis present

## 2020-05-14 DIAGNOSIS — I452 Bifascicular block: Secondary | ICD-10-CM | POA: Diagnosis present

## 2020-05-14 DIAGNOSIS — Z9989 Dependence on other enabling machines and devices: Secondary | ICD-10-CM

## 2020-05-14 DIAGNOSIS — E119 Type 2 diabetes mellitus without complications: Secondary | ICD-10-CM

## 2020-05-14 DIAGNOSIS — R0902 Hypoxemia: Secondary | ICD-10-CM | POA: Diagnosis not present

## 2020-05-14 DIAGNOSIS — F329 Major depressive disorder, single episode, unspecified: Secondary | ICD-10-CM

## 2020-05-14 DIAGNOSIS — F419 Anxiety disorder, unspecified: Secondary | ICD-10-CM

## 2020-05-14 DIAGNOSIS — Z7984 Long term (current) use of oral hypoglycemic drugs: Secondary | ICD-10-CM

## 2020-05-14 DIAGNOSIS — K5900 Constipation, unspecified: Secondary | ICD-10-CM | POA: Diagnosis present

## 2020-05-14 DIAGNOSIS — Z951 Presence of aortocoronary bypass graft: Secondary | ICD-10-CM

## 2020-05-14 DIAGNOSIS — N1831 Chronic kidney disease, stage 3a: Secondary | ICD-10-CM | POA: Diagnosis present

## 2020-05-14 DIAGNOSIS — T380X5A Adverse effect of glucocorticoids and synthetic analogues, initial encounter: Secondary | ICD-10-CM | POA: Diagnosis not present

## 2020-05-14 DIAGNOSIS — R0600 Dyspnea, unspecified: Secondary | ICD-10-CM | POA: Diagnosis not present

## 2020-05-14 DIAGNOSIS — F32A Depression, unspecified: Secondary | ICD-10-CM | POA: Diagnosis present

## 2020-05-14 DIAGNOSIS — N189 Chronic kidney disease, unspecified: Secondary | ICD-10-CM

## 2020-05-14 DIAGNOSIS — J9621 Acute and chronic respiratory failure with hypoxia: Secondary | ICD-10-CM

## 2020-05-14 DIAGNOSIS — K219 Gastro-esophageal reflux disease without esophagitis: Secondary | ICD-10-CM

## 2020-05-14 DIAGNOSIS — Z87891 Personal history of nicotine dependence: Secondary | ICD-10-CM

## 2020-05-14 DIAGNOSIS — Z9109 Other allergy status, other than to drugs and biological substances: Secondary | ICD-10-CM

## 2020-05-14 DIAGNOSIS — J441 Chronic obstructive pulmonary disease with (acute) exacerbation: Secondary | ICD-10-CM | POA: Diagnosis present

## 2020-05-14 DIAGNOSIS — I2510 Atherosclerotic heart disease of native coronary artery without angina pectoris: Secondary | ICD-10-CM | POA: Diagnosis present

## 2020-05-14 DIAGNOSIS — Z9981 Dependence on supplemental oxygen: Secondary | ICD-10-CM

## 2020-05-14 DIAGNOSIS — I444 Left anterior fascicular block: Secondary | ICD-10-CM | POA: Diagnosis present

## 2020-05-14 DIAGNOSIS — J189 Pneumonia, unspecified organism: Secondary | ICD-10-CM

## 2020-05-14 DIAGNOSIS — Y92239 Unspecified place in hospital as the place of occurrence of the external cause: Secondary | ICD-10-CM | POA: Diagnosis not present

## 2020-05-14 DIAGNOSIS — I480 Paroxysmal atrial fibrillation: Secondary | ICD-10-CM | POA: Diagnosis present

## 2020-05-14 DIAGNOSIS — Z800 Family history of malignant neoplasm of digestive organs: Secondary | ICD-10-CM

## 2020-05-14 DIAGNOSIS — A419 Sepsis, unspecified organism: Secondary | ICD-10-CM

## 2020-05-14 DIAGNOSIS — E1165 Type 2 diabetes mellitus with hyperglycemia: Secondary | ICD-10-CM | POA: Diagnosis present

## 2020-05-14 DIAGNOSIS — I4892 Unspecified atrial flutter: Secondary | ICD-10-CM | POA: Diagnosis present

## 2020-05-14 DIAGNOSIS — N183 Chronic kidney disease, stage 3 unspecified: Secondary | ICD-10-CM | POA: Diagnosis present

## 2020-05-14 DIAGNOSIS — Z79899 Other long term (current) drug therapy: Secondary | ICD-10-CM

## 2020-05-14 DIAGNOSIS — Z20822 Contact with and (suspected) exposure to covid-19: Secondary | ICD-10-CM | POA: Diagnosis present

## 2020-05-14 DIAGNOSIS — R531 Weakness: Secondary | ICD-10-CM | POA: Diagnosis not present

## 2020-05-14 DIAGNOSIS — Z823 Family history of stroke: Secondary | ICD-10-CM

## 2020-05-14 DIAGNOSIS — Z515 Encounter for palliative care: Secondary | ICD-10-CM | POA: Diagnosis not present

## 2020-05-14 DIAGNOSIS — Z885 Allergy status to narcotic agent status: Secondary | ICD-10-CM

## 2020-05-14 DIAGNOSIS — E1129 Type 2 diabetes mellitus with other diabetic kidney complication: Secondary | ICD-10-CM | POA: Diagnosis present

## 2020-05-14 DIAGNOSIS — R519 Headache, unspecified: Secondary | ICD-10-CM

## 2020-05-14 DIAGNOSIS — R778 Other specified abnormalities of plasma proteins: Secondary | ICD-10-CM | POA: Diagnosis not present

## 2020-05-14 LAB — COMPREHENSIVE METABOLIC PANEL
ALT: 35 U/L (ref 0–44)
AST: 29 U/L (ref 15–41)
Albumin: 3.2 g/dL — ABNORMAL LOW (ref 3.5–5.0)
Alkaline Phosphatase: 109 U/L (ref 38–126)
Anion gap: 10 (ref 5–15)
BUN: 37 mg/dL — ABNORMAL HIGH (ref 8–23)
CO2: 31 mmol/L (ref 22–32)
Calcium: 8.3 mg/dL — ABNORMAL LOW (ref 8.9–10.3)
Chloride: 94 mmol/L — ABNORMAL LOW (ref 98–111)
Creatinine, Ser: 1.83 mg/dL — ABNORMAL HIGH (ref 0.61–1.24)
GFR, Estimated: 37 mL/min — ABNORMAL LOW (ref >60)
Glucose, Bld: 420 mg/dL — ABNORMAL HIGH (ref 70–99)
Potassium: 4 mmol/L (ref 3.5–5.1)
Sodium: 135 mmol/L (ref 135–145)
Total Bilirubin: 1.1 mg/dL (ref 0.3–1.2)
Total Protein: 6.2 g/dL — ABNORMAL LOW (ref 6.5–8.1)

## 2020-05-14 LAB — CBC WITH DIFFERENTIAL/PLATELET
Abs Immature Granulocytes: 0.12 10*3/uL — ABNORMAL HIGH (ref 0.00–0.07)
Basophils Absolute: 0 10*3/uL (ref 0.0–0.1)
Basophils Relative: 0 %
Eosinophils Absolute: 0.1 10*3/uL (ref 0.0–0.5)
Eosinophils Relative: 1 %
HCT: 35.3 % — ABNORMAL LOW (ref 39.0–52.0)
Hemoglobin: 11.1 g/dL — ABNORMAL LOW (ref 13.0–17.0)
Immature Granulocytes: 1 %
Lymphocytes Relative: 5 %
Lymphs Abs: 0.8 10*3/uL (ref 0.7–4.0)
MCH: 27.3 pg (ref 26.0–34.0)
MCHC: 31.4 g/dL (ref 30.0–36.0)
MCV: 86.9 fL (ref 80.0–100.0)
Monocytes Absolute: 1 10*3/uL (ref 0.1–1.0)
Monocytes Relative: 7 %
Neutro Abs: 13.2 10*3/uL — ABNORMAL HIGH (ref 1.7–7.7)
Neutrophils Relative %: 86 %
Platelets: 167 10*3/uL (ref 150–400)
RBC: 4.06 MIL/uL — ABNORMAL LOW (ref 4.22–5.81)
RDW: 16.7 % — ABNORMAL HIGH (ref 11.5–15.5)
WBC: 15.3 10*3/uL — ABNORMAL HIGH (ref 4.0–10.5)
nRBC: 0 % (ref 0.0–0.2)

## 2020-05-14 LAB — PROCALCITONIN: Procalcitonin: <0.1 ng/mL

## 2020-05-14 LAB — GLUCOSE, CAPILLARY: Glucose-Capillary: 465 mg/dL — ABNORMAL HIGH (ref 70–99)

## 2020-05-14 LAB — RESP PANEL BY RT-PCR (FLU A&B, COVID) ARPGX2
Influenza A by PCR: NEGATIVE
Influenza B by PCR: NEGATIVE
SARS Coronavirus 2 by RT PCR: NEGATIVE

## 2020-05-14 LAB — PROTIME-INR
INR: 1.2 (ref 0.8–1.2)
Prothrombin Time: 14.6 seconds (ref 11.4–15.2)

## 2020-05-14 LAB — CBG MONITORING, ED: Glucose-Capillary: 400 mg/dL — ABNORMAL HIGH (ref 70–99)

## 2020-05-14 LAB — BRAIN NATRIURETIC PEPTIDE: B Natriuretic Peptide: 336 pg/mL — ABNORMAL HIGH (ref 0.0–100.0)

## 2020-05-14 LAB — LACTIC ACID, PLASMA: Lactic Acid, Venous: 1.4 mmol/L (ref 0.5–1.9)

## 2020-05-14 MED ORDER — SODIUM CHLORIDE 0.9% FLUSH: 3.0000 mL | INTRAVENOUS | Status: DC | PRN

## 2020-05-14 MED ORDER — PANTOPRAZOLE SODIUM 40 MG PO TBEC
40.0000 mg | DELAYED_RELEASE_TABLET | Freq: Every day | ORAL | Status: DC
  Administered 2020-05-15 – 2020-05-20 (×6): 40 mg via ORAL
  Filled 2020-05-14 (×6): qty 1

## 2020-05-14 MED ORDER — FLUTICASONE FUROATE-VILANTEROL 200-25 MCG/INH IN AEPB
1.0000 | INHALATION_SPRAY | Freq: Every day | RESPIRATORY_TRACT | Status: DC
  Administered 2020-05-15 – 2020-05-20 (×6): 1 via RESPIRATORY_TRACT
  Filled 2020-05-14: qty 28

## 2020-05-14 MED ORDER — ASPIRIN EC 81 MG PO TBEC
81.0000 mg | DELAYED_RELEASE_TABLET | Freq: Every day | ORAL | Status: DC
  Administered 2020-05-15 – 2020-05-17 (×3): 81 mg via ORAL
  Filled 2020-05-14 (×3): qty 1

## 2020-05-14 MED ORDER — ALBUTEROL SULFATE HFA 108 (90 BASE) MCG/ACT IN AERS
1.0000 | INHALATION_SPRAY | RESPIRATORY_TRACT | Status: DC
  Administered 2020-05-15 – 2020-05-20 (×31): 1 via RESPIRATORY_TRACT
  Filled 2020-05-14: qty 6.7

## 2020-05-14 MED ORDER — INSULIN ASPART 100 UNIT/ML ~~LOC~~ SOLN
10.0000 [IU] | Freq: Three times a day (TID) | SUBCUTANEOUS | Status: DC
  Administered 2020-05-15 – 2020-05-17 (×7): 10 [IU] via SUBCUTANEOUS
  Filled 2020-05-14 (×7): qty 1

## 2020-05-14 MED ORDER — SODIUM CHLORIDE 0.9% FLUSH
3.0000 mL | Freq: Two times a day (BID) | INTRAVENOUS | Status: DC
  Administered 2020-05-15 – 2020-05-19 (×9): 3 mL via INTRAVENOUS

## 2020-05-14 MED ORDER — DIAZEPAM 5 MG PO TABS
5.0000 mg | ORAL_TABLET | Freq: Two times a day (BID) | ORAL | Status: DC | PRN
  Administered 2020-05-18: 22:00:00 5 mg via ORAL
  Filled 2020-05-14: qty 1

## 2020-05-14 MED ORDER — CITALOPRAM HYDROBROMIDE 20 MG PO TABS
20.0000 mg | ORAL_TABLET | Freq: Every day | ORAL | Status: DC
  Administered 2020-05-15 – 2020-05-19 (×7): 20 mg via ORAL
  Filled 2020-05-14 (×7): qty 1

## 2020-05-14 MED ORDER — PREGABALIN 25 MG PO CAPS
25.0000 mg | ORAL_CAPSULE | Freq: Two times a day (BID) | ORAL | Status: DC
  Administered 2020-05-15 – 2020-05-20 (×12): 25 mg via ORAL
  Filled 2020-05-14 (×12): qty 1

## 2020-05-14 MED ORDER — INSULIN ASPART 100 UNIT/ML ~~LOC~~ SOLN
0.0000 [IU] | Freq: Three times a day (TID) | SUBCUTANEOUS | Status: DC
  Administered 2020-05-15: 18:00:00 3 [IU] via SUBCUTANEOUS
  Administered 2020-05-15: 09:00:00 15 [IU] via SUBCUTANEOUS
  Administered 2020-05-15: 14:00:00 8 [IU] via SUBCUTANEOUS
  Administered 2020-05-16: 13:00:00 5 [IU] via SUBCUTANEOUS
  Administered 2020-05-16: 08:00:00 3 [IU] via SUBCUTANEOUS
  Administered 2020-05-16: 17:00:00 10 [IU] via SUBCUTANEOUS
  Administered 2020-05-17: 09:00:00 8 [IU] via SUBCUTANEOUS
  Administered 2020-05-17: 12:00:00 15 [IU] via SUBCUTANEOUS
  Filled 2020-05-14 (×9): qty 1

## 2020-05-14 MED ORDER — SODIUM CHLORIDE 0.9 % IV SOLN: 250.0000 mL | INTRAVENOUS | Status: DC | PRN

## 2020-05-14 MED ORDER — INSULIN ASPART 100 UNIT/ML ~~LOC~~ SOLN
6.0000 [IU] | Freq: Once | SUBCUTANEOUS | Status: AC
  Administered 2020-05-14: 15:00:00 6 [IU] via INTRAVENOUS
  Filled 2020-05-14: qty 1

## 2020-05-14 MED ORDER — ONDANSETRON HCL 4 MG/2ML IJ SOLN: 4.0000 mg | Freq: Four times a day (QID) | INTRAMUSCULAR | Status: DC | PRN

## 2020-05-14 MED ORDER — APIXABAN 2.5 MG PO TABS
2.5000 mg | ORAL_TABLET | Freq: Two times a day (BID) | ORAL | Status: DC
  Administered 2020-05-15 (×2): 2.5 mg via ORAL
  Filled 2020-05-14 (×2): qty 1

## 2020-05-14 MED ORDER — ACETAMINOPHEN 325 MG PO TABS
650.0000 mg | ORAL_TABLET | ORAL | Status: DC | PRN
  Administered 2020-05-15 – 2020-05-18 (×2): 650 mg via ORAL
  Filled 2020-05-14 (×2): qty 2

## 2020-05-14 MED ORDER — ATORVASTATIN CALCIUM 20 MG PO TABS
20.0000 mg | ORAL_TABLET | Freq: Every day | ORAL | Status: DC
  Administered 2020-05-15 – 2020-05-20 (×6): 20 mg via ORAL
  Filled 2020-05-14 (×6): qty 1

## 2020-05-14 MED ORDER — INSULIN GLARGINE 100 UNIT/ML ~~LOC~~ SOLN
25.0000 [IU] | Freq: Every day | SUBCUTANEOUS | Status: DC
  Administered 2020-05-15 – 2020-05-16 (×3): 25 [IU] via SUBCUTANEOUS
  Filled 2020-05-14 (×4): qty 0.25

## 2020-05-14 MED ORDER — IOHEXOL 350 MG/ML SOLN
75.0000 mL | Freq: Once | INTRAVENOUS | Status: AC | PRN
  Administered 2020-05-14: 16:00:00 75 mL via INTRAVENOUS

## 2020-05-14 MED ORDER — METOPROLOL TARTRATE 50 MG PO TABS
50.0000 mg | ORAL_TABLET | Freq: Two times a day (BID) | ORAL | Status: DC
  Administered 2020-05-15 – 2020-05-20 (×12): 50 mg via ORAL
  Filled 2020-05-14 (×12): qty 1

## 2020-05-14 MED ORDER — ASCORBIC ACID 500 MG PO TABS
500.0000 mg | ORAL_TABLET | Freq: Every day | ORAL | Status: DC
Start: 2020-05-15 — End: 2020-05-20
  Administered 2020-05-15 – 2020-05-20 (×6): 500 mg via ORAL
  Filled 2020-05-14 (×6): qty 1

## 2020-05-14 MED ORDER — SODIUM CHLORIDE 0.9 % IV SOLN
2.0000 g | Freq: Once | INTRAVENOUS | Status: AC
  Administered 2020-05-14: 19:00:00 2 g via INTRAVENOUS
  Filled 2020-05-14: qty 2

## 2020-05-14 MED ORDER — LISINOPRIL 10 MG PO TABS
10.0000 mg | ORAL_TABLET | Freq: Two times a day (BID) | ORAL | Status: DC
  Administered 2020-05-15 – 2020-05-20 (×11): 10 mg via ORAL
  Filled 2020-05-14 (×11): qty 1

## 2020-05-14 MED ORDER — VANCOMYCIN HCL IN DEXTROSE 1-5 GM/200ML-% IV SOLN
1000.0000 mg | Freq: Once | INTRAVENOUS | Status: AC
  Administered 2020-05-14: 20:00:00 1000 mg via INTRAVENOUS
  Filled 2020-05-14: qty 200

## 2020-05-14 MED ORDER — VITAMIN B-12 1000 MCG PO TABS
500.0000 ug | ORAL_TABLET | Freq: Every day | ORAL | Status: DC
  Administered 2020-05-15 – 2020-05-20 (×6): 500 ug via ORAL
  Filled 2020-05-14 (×6): qty 1

## 2020-05-14 MED ORDER — IPRATROPIUM-ALBUTEROL 0.5-2.5 (3) MG/3ML IN SOLN
3.0000 mL | Freq: Once | RESPIRATORY_TRACT | Status: AC
  Administered 2020-05-14: 17:00:00 3 mL via RESPIRATORY_TRACT
  Filled 2020-05-14: qty 3

## 2020-05-14 MED ORDER — FUROSEMIDE 10 MG/ML IJ SOLN
20.0000 mg | Freq: Two times a day (BID) | INTRAMUSCULAR | Status: DC
  Administered 2020-05-15 – 2020-05-17 (×6): 20 mg via INTRAVENOUS
  Filled 2020-05-14 (×6): qty 4

## 2020-05-14 MED ORDER — TIOTROPIUM BROMIDE MONOHYDRATE 18 MCG IN CAPS
18.0000 ug | ORAL_CAPSULE | Freq: Every day | RESPIRATORY_TRACT | Status: DC
  Administered 2020-05-15 – 2020-05-19 (×5): 18 ug via RESPIRATORY_TRACT
  Filled 2020-05-14 (×2): qty 5

## 2020-05-14 MED ORDER — VITAMIN D (ERGOCALCIFEROL) 1.25 MG (50000 UNIT) PO CAPS
50000.0000 [IU] | ORAL_CAPSULE | ORAL | Status: DC
  Administered 2020-05-18: 13:00:00 50000 [IU] via ORAL
  Filled 2020-05-14: qty 1

## 2020-05-14 MED ORDER — AMLODIPINE BESYLATE 5 MG PO TABS
5.0000 mg | ORAL_TABLET | Freq: Every day | ORAL | Status: DC
  Administered 2020-05-15 – 2020-05-19 (×5): 5 mg via ORAL
  Filled 2020-05-14 (×5): qty 1

## 2020-05-14 NOTE — ED Notes (Addendum)
Pt awake, alert and oriented x4.  NADN.  RR even and unlabored on 4L O2 via [place].  Cardiac monitoring maintained- controlled A-Fib HR 79.  Abdomen round soft nontender.  Skin warm dry and intact.  Pt denies active HA and no cough noted at this time.  BGL remains elevated @400 

## 2020-05-14 NOTE — ED Provider Notes (Signed)
[facility] Emergency Department Provider Note    None    (approximate)  I have reviewed the triage vital signs and the nursing notes.   HISTORY  Chief Complaint Headache    HPI Ernest Mclean is a 79 y.o. male below listed past medical history recent admission to hospital with history of chronic hypoxic respiratory failure with COPD and CHF presents to the ER for evaluation persistent cough shortness of breath fever chills and headache.  Was not sudden onset.  Is frontal.  No neck stiffness.  Was called in a prescription for Z-Pak yesterday by PCP due to worsening cough and concern for pneumonia.  He is triple vaccinated against Covid.  He denies any abdominal pain.    Past Medical History:  Diagnosis Date  . Atrial fibrillation ([place])   . Basal cell carcinoma 06/2017   Left Ear  . CAD (coronary artery disease)   . CKD (chronic kidney disease)   . Congestive heart failure ([place])   . COPD (chronic obstructive pulmonary disease) ([place])   . Diabetes ([place])   . Hypertension   . OSA on CPAP   . 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 Stroke Other   . Diabetes Other   . Breast cancer Other   . Colon cancer Other    Past Surgical History:  Procedure Laterality Date  . CLAVICLE SURGERY    . heart bypass    . HERNIA REPAIR    . MOHS SURGERY  07/10/2017   Head   Patient Active Problem List   Diagnosis Date Noted  . CHF exacerbation ([place]) 05/01/2020  . Atrial fibrillation, chronic ([place]) 09/27/2019  . Community acquired pneumonia 09/27/2019  . Acute on chronic respiratory failure with hypoxia ([place]) 09/27/2019  . Elevated troponin 09/27/2019  . Acute metabolic encephalopathy [id number]  . Pneumonia 01/03/2019  . 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 Atherosclerosis of native arteries of extremity with intermittent claudication ([place]) 05/08/2017  . Atherosclerosis of native arteries of  the extremities with ulceration ([place]) 03/20/2017  . CKD (chronic kidney disease), stage IIIa 03/20/2017  . Acute on chronic diastolic CHF (congestive heart failure) ([place]) 08/01/2016  . Anxiety 02/22/2016  . Chronic respiratory failure ([place]) 08/10/2015  . Hypoglycemia 05/12/2015  . Type II diabetes mellitus with renal manifestations ([place]) 05/12/2015  . HTN (hypertension) 05/12/2015  . CAD (coronary artery disease) 05/12/2015  . Atrial flutter ([place]) 05/12/2015  . GERD (gastroesophageal reflux disease) 05/12/2015  . Solitary pulmonary nodule 02/10/2015  . OSA on CPAP 08/17/2014  . COPD with acute exacerbation ([place]) 08/17/2014      Prior to Admission medications   Medication Sig Start Date End Date Taking? Authorizing Provider  albuterol (VENTOLIN HFA) 108 (90 Base) MCG/ACT inhaler INHALE 2 PUFFS INTO THE LUNGS EVERY 4 HOURS AS NEEDED FOR WHEEZING OR SHORTNESS OF BREATH 04/18/20   Flora Lipps, MD  amLODipine (NORVASC) 5 MG tablet Take 5 mg by mouth daily.  08/17/14   [provider]  apixaban (ELIQUIS) 5 MG TABS tablet Take 2.5 mg by mouth 2 (two) times daily.     [provider]  aspirin EC 81 MG tablet Take 81 mg by mouth daily. 08/17/14   [provider]  atorvastatin (LIPITOR) 20 MG tablet Take by mouth. 10/08/19   [provider]  BD INSULIN SYRINGE U/F 31G X 5/16" 0.5 ML MISC  10/10/19  [provider]  citalopram (CELEXA) 20 MG tablet Take 20 mg by mouth at bedtime.  07/30/14   [provider]  CRANBERRY PO Take 820 mg by mouth daily.    [provider]  cyanocobalamin 500 MCG tablet Take 500 mcg by mouth daily.    [provider]  diazepam (VALIUM) 5 MG tablet Take 5 mg by mouth every 12 (twelve) hours as needed for anxiety.  10/24/13   [provider]  ergocalciferol (VITAMIN D2) 50000 units capsule Take 50,000 Units by mouth every 30 (thirty) days.    [provider]  Fluticasone-Salmeterol  (WIXELA INHUB) 500-50 MCG/DOSE AEPB Inhale 1 puff into the lungs 2 (two) times daily. 01/12/20   Erin Fulling, MD  furosemide (LASIX) 20 MG tablet Take 20 mg by mouth daily.     [provider]  HUMALOG 100 UNIT/ML injection Inject 10-25 Units into the skin 3 (three) times daily with meals. (sliding scale as needed to a maximum of 150u daily) 06/24/19   [provider]  insulin glargine (LANTUS) 100 UNIT/ML injection Inject 60 Units into the skin at bedtime.     [provider]  lisinopril (PRINIVIL,ZESTRIL) 5 MG tablet Take 10 mg by mouth in the morning and at bedtime.    [provider]  metFORMIN (GLUCOPHAGE-XR) 500 MG 24 hr tablet Take 1,000 mg by mouth daily with supper.    [provider]  metoprolol tartrate (LOPRESSOR) 100 MG tablet Take 0.5 tablets (50 mg total) by mouth 2 (two) times daily. This is a decrease from your previous 100 mg twice a day. 10/02/19   Darlin Priestly, MD  OMEGA-3 FATTY ACIDS-VITAMIN E PO Take 1 capsule by mouth daily.    [provider]  omeprazole (PRILOSEC) 20 MG capsule Take 20 mg by mouth daily.    [provider]  pravastatin (PRAVACHOL) 20 MG tablet Take 20 mg by mouth every evening.    [provider]  pregabalin (LYRICA) 25 MG capsule Take 25 mg by mouth 2 (two) times daily.    [provider]  tiotropium (SPIRIVA) 18 MCG inhalation capsule Place 18 mcg into inhaler and inhale daily. 07/31/14   [provider]  vitamin C (ASCORBIC ACID) 500 MG tablet Take 500 mg by mouth daily.    [provider]    Allergies Codeine, Hydralazine, Hydrocodone-acetaminophen, and Penicillins    Social History Social History   Tobacco Use  . Smoking status: Former Smoker    Packs/day: 1.00    Years: 30.00    Pack years: 30.00    Types: Cigarettes    Quit date: 12/14/2001    Years since quitting: 18.4  . Smokeless tobacco: Never Used  . Tobacco comment: quit 12/14/2001  Vaping  Use  . Vaping Use: Never used  Substance Use Topics  . Alcohol use: No    Alcohol/week: 0.0 standard drinks  . Drug use: No    Review of Systems Patient denies headaches, rhinorrhea, blurry vision, numbness, shortness of breath, chest pain, edema, cough, abdominal pain, nausea, vomiting, diarrhea, dysuria, fevers, rashes or hallucinations unless otherwise stated above in HPI. ____________________________________________   PHYSICAL EXAM:  VITAL SIGNS: Vitals:   05/14/20 1500 05/14/20 1515  BP: (!) 171/63   Pulse: 74 79  Resp: 19 17  Temp:    SpO2: 100% 99%    Constitutional: Alert and oriented.  Eyes: Conjunctivae are normal.  Head: Atraumatic. Nose: No congestion/rhinnorhea. Mouth/Throat: Mucous membranes are moist.  Neck: No stridor. Painless ROM.  Cardiovascular:  Irregularly irregular rhythm. Grossly normal heart sounds.  Good peripheral circulation. Respiratory: Normal respiratory effort.  No retractions. Lungs with scattered wheeze throughout Gastrointestinal: Soft and nontender. No distention. No abdominal bruits. No CVA tenderness. Genitourinary:  Musculoskeletal: No lower extremity tenderness nor edema.  No joint effusions. Neurologic:  Normal speech and language. No gross focal neurologic deficits are appreciated. No facial droop Skin:  Skin is warm, dry and intact. No rash noted. Psychiatric: Mood and affect are normal. Speech and behavior are normal.  ____________________________________________   LABS (all labs ordered are listed, but only abnormal results are displayed)  Results for orders placed or performed during the hospital encounter of 05/14/20 (from the past 24 hour(s))  Resp Panel by RT-PCR (Flu A&B, Covid) Nasopharyngeal Swab     Status: None   Collection Time: 05/14/20  1:54 PM   Specimen: Nasopharyngeal Swab; Nasopharyngeal(NP) swabs in vial transport medium  Result Value Ref Range   SARS Coronavirus 2 by RT PCR NEGATIVE NEGATIVE   Influenza  A by PCR NEGATIVE NEGATIVE   Influenza B by PCR NEGATIVE NEGATIVE  Comprehensive metabolic panel     Status: Abnormal   Collection Time: 05/14/20  1:58 PM  Result Value Ref Range   Sodium 135 135 - 145 mmol/L   Potassium 4.0 3.5 - 5.1 mmol/L   Chloride 94 (L) 98 - 111 mmol/L   CO2 31 22 - 32 mmol/L   Glucose, Bld 420 (H) 70 - 99 mg/dL   BUN 37 (H) 8 - 23 mg/dL   Creatinine, Ser 1.83 (H) 0.61 - 1.24 mg/dL   Calcium 8.3 (L) 8.9 - 10.3 mg/dL   Total Protein 6.2 (L) 6.5 - 8.1 g/dL   Albumin 3.2 (L) 3.5 - 5.0 g/dL   AST 29 15 - 41 U/L   ALT 35 0 - 44 U/L   Alkaline Phosphatase 109 38 - 126 U/L   Total Bilirubin 1.1 0.3 - 1.2 mg/dL   GFR, Estimated 37 (L) >60 mL/min   Anion gap 10 5 - 15  Lactic acid, plasma     Status: None   Collection Time: 05/14/20  1:58 PM  Result Value Ref Range   Lactic Acid, Venous 1.4 0.5 - 1.9 mmol/L  CBC with Differential     Status: Abnormal   Collection Time: 05/14/20  1:58 PM  Result Value Ref Range   WBC 15.3 (H) 4.0 - 10.5 K/uL   RBC 4.06 (L) 4.22 - 5.81 MIL/uL   Hemoglobin 11.1 (L) 13.0 - 17.0 g/dL   HCT 35.3 (L) 39.0 - 52.0 %   MCV 86.9 80.0 - 100.0 fL   MCH 27.3 26.0 - 34.0 pg   MCHC 31.4 30.0 - 36.0 g/dL   RDW 16.7 (H) 11.5 - 15.5 %   Platelets 167 150 - 400 K/uL   nRBC 0.0 0.0 - 0.2 %   Neutrophils Relative % 86 %   Neutro Abs 13.2 (H) 1.7 - 7.7 K/uL   Lymphocytes Relative 5 %   Lymphs Abs 0.8 0.7 - 4.0 K/uL   Monocytes Relative 7 %   Monocytes Absolute 1.0 0.1 - 1.0 K/uL   Eosinophils Relative 1 %   Eosinophils Absolute 0.1 0.0 - 0.5 K/uL   Basophils Relative 0 %   Basophils Absolute 0.0 0.0 - 0.1 K/uL   Immature Granulocytes 1 %   Abs Immature Granulocytes 0.12 (H) 0.00 - 0.07 K/uL  Protime-INR     Status:  None   Collection Time: 05/14/20  1:58 PM  Result Value Ref Range   Prothrombin Time 14.6 11.4 - 15.2 seconds   INR 1.2 0.8 - 1.2   ____________________________________________  EKG My review and personal interpretation at  Time: 13:34   Indication: cough  Rate: 95  Rhythm: aflutter Axis: normal Other: nonspecific st abn ____________________________________________  RADIOLOGY  I personally reviewed all radiographic images ordered to evaluate for the above acute complaints and reviewed radiology reports and findings.  These findings were personally discussed with the patient.  Please see medical record for radiology report.  ____________________________________________   PROCEDURES  Procedure(s) performed:  Procedures    Critical Care performed: no ____________________________________________   INITIAL IMPRESSION / ASSESSMENT AND PLAN / ED COURSE  Pertinent labs & imaging results that were available during my care of the patient were reviewed by me and considered in my medical decision making (see chart for details).   DDX: Pneumonia, COVID-19, URI, SAH, IPH, tension, migraine, sepsis  Ernest Mclean is a 79 y.o. who presents to the ED with presentation as described above. Patient low-grade temperature hypertensive is having some cough. He is got chronic O2 requirement. Does not feel more short of breath than normal. Concerning for sepsis. He has no meningismus. Doubt encephalitis. He is not toxic appearing. Blood will be sent for the but differential. Patient be signed out to oncoming physician pending reassessment work-up for above differential.     The patient was evaluated in Emergency Department today for the symptoms described in the history of present illness. He/she was evaluated in the context of the global COVID-19 pandemic, which necessitated consideration that the patient might be at risk for infection with the SARS-CoV-2 virus that causes COVID-19. Institutional protocols and algorithms that pertain to the evaluation of patients at risk for COVID-19 are in a state of rapid change based on information released by regulatory bodies including the CDC and federal and state organizations. These  policies and algorithms were followed during the patient's care in the ED.  As part of my medical decision making, I reviewed the following data within the [place] notes reviewed and incorporated, Labs reviewed, notes from prior ED visits and [place] Controlled Substance Database   ____________________________________________   FINAL CLINICAL IMPRESSION(S) / ED DIAGNOSES  Final diagnoses:  Bad headache  Cough      NEW MEDICATIONS STARTED DURING THIS VISIT:  New Prescriptions   No medications on file     Note:  This document was prepared using Dragon voice recognition software and may include unintentional dictation errors.    Merlyn Lot, MD 05/14/20 1535

## 2020-05-14 NOTE — ED Triage Notes (Addendum)
Pt to ED via [facility] from home. Per EMS pt called with c/o worse HA of his life that worsens when he coughs. Pt stating took tylenol at home with no relief. Pt stating HA started after taking BP meds this morning at 0700. Pt febrile at 102. Pt denies CP, SOB or weakness. CBG 511.   Upon arrival pt in NAD. Pt with hx CHF, 100% block of RCA, DM, afib and COPD. Pt currently on eliquis. Pt uses 4L [place] chronically at home. Pt recently admitted for CHF and pneumonia.

## 2020-05-14 NOTE — H&P (Signed)
[facility] History and Physical  Ernest Mclean ZOX:[id number] DOB: 07-01-1940 DOA: 05/14/2020  Referring physician: Dr. Archie Balboa PCP: Ernest Pitch, MD   Chief Complaint: shortness of breath  HPI: Ernest Mclean is a 79 y.o. male with history of CAD, diastolic CHF, A. fib, CKD, OSA, type 2 diabetes, COPD on 2 L home O2, who presents with headache.  Review chart shows patient was recently admitted for 2 days earlier this month with COPD exacerbation and acute heart failure exacerbation.  Appears to follow regularly with multiple specialists.  Most recently saw cardiology 2 days prior and was noted to be euvolemic at that visit, though they also instructed him to double his lasix for the next two days as well.  On my history patient reports he presented today primarily due to headache.  He has been having a bad cough for several days and every time he coughs felt intense pain in the back of his head.  His wife also notes that his blood pressure has been uncontrolled over this.  And that he is also been having significant oxygen desaturations down to the 70s and 80s just with walking to the bathroom.  He reports he has been taking his meds as prescribed and has not missed any doses.  He did have some leg swelling a few days ago but denies any currently.  Patient denies any chest pain or shortness of breath however his wife has noted that he has had increased work of breathing over the past several days. 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 He is normally on 2 L of oxygen at home.  In the ED initial vital signs notable for hypertension with blood pressures ranging 160s to 170s over 60s to 70s, and increased oxygen requirement at 4 L.  Lab work-up notable for CMP with glucose 420, creatinine 1.83 (at baseline), CBC with mild anemia and  leukocytosis at baseline, negative procalcitonin and lactic acid, BNP at close to baseline of 336.  Respiratory viral panel negative for influenza and Covid.  Blood cultures obtained.  Chest x-ray is unremarkable, as was CT head.  CTPA was obtained which showed no evidence of PE but did show findings consistent with edema versus infection.  He was started on vancomycin and cefepime for presumed HCAP, and also given insulin for his hyperglycemia and DuoNebs for his respiratory symptoms.   Review of Systems:  Pertinent positives and negative per HPI, all others reviewed and negative  Past Medical History:  Diagnosis Date  . Atrial fibrillation ([place])   . Basal cell carcinoma 06/2017   Left Ear  . CAD (coronary artery disease)   . CKD (chronic kidney disease)   . Congestive heart failure ([place])   . COPD (chronic obstructive pulmonary disease) ([place])   . Diabetes ([place])   . Hypertension   . OSA on CPAP   . 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 CLAVICLE SURGERY    . heart bypass    . HERNIA REPAIR    . MOHS SURGERY  07/10/2017   Head   Social History:  reports that he quit smoking about 18 years ago. His smoking use included cigarettes. He has a 30.00 pack-year smoking history. He has never used smokeless tobacco.  He reports that he does not drink alcohol and does not use drugs.  Allergies  Allergen Reactions  . Codeine Hives, Rash and Swelling  . Hydralazine Other (See Comments)    tongue swollen and couldn't wake up ---- not positive it was this or a mix of this with something else or high sugar  . Hydrocodone-Acetaminophen Other (See Comments)  . Penicillins Other (See Comments)    Passed out (at 79 yrs old) Has patient had a PCN reaction causing immediate rash, facial/tongue/throat swelling, SOB or lightheadedness with hypotension: No Has patient had a PCN reaction causing severe rash involving mucus membranes or skin  necrosis: No Has patient had a PCN reaction that required hospitalization No Has patient had a PCN reaction occurring within the last 10 years: No If all of the above answers are "NO", then may proceed with Cephalosporin use.     Family History  Problem Relation Age of Onset  . Stroke Other   . Diabetes Other   . Breast cancer Other   . Colon cancer Other      Prior to Admission medications   Medication Sig Start Date End Date Taking? Authorizing Provider  amLODipine (NORVASC) 5 MG tablet Take 5 mg by mouth daily.  08/17/14  Yes [provider]  aspirin EC 81 MG tablet Take 81 mg by mouth daily. 08/17/14  Yes [provider]  atorvastatin (LIPITOR) 20 MG tablet Take 20 mg by mouth daily. 10/08/19  Yes [provider]  azithromycin (ZITHROMAX) 250 MG tablet Take 250 mg by mouth daily. 05/13/20  Yes [provider]  citalopram (CELEXA) 20 MG tablet Take 20 mg by mouth at bedtime.  07/30/14  Yes [provider]  CRANBERRY PO Take 820 mg by mouth daily.   Yes [provider]  cyanocobalamin 500 MCG tablet Take 500 mcg by mouth daily.   Yes [provider]  diazepam (VALIUM) 5 MG tablet Take 5 mg by mouth every 12 (twelve) hours as needed for anxiety.  10/24/13  Yes [provider]  Fluticasone-Salmeterol (WIXELA INHUB) 500-50 MCG/DOSE AEPB Inhale 1 puff into the lungs 2 (two) times daily. 01/12/20  Yes Flora Lipps, MD  furosemide (LASIX) 20 MG tablet Take 20 mg by mouth daily.    Yes [provider]  HUMALOG 100 UNIT/ML injection Inject 10-25 Units into the skin 3 (three) times daily with meals. (sliding scale as needed to a maximum of 150u daily) 06/24/19  Yes [provider]  insulin glargine (LANTUS) 100 UNIT/ML injection Inject 60 Units into the skin at bedtime.    Yes [provider]  lisinopril (ZESTRIL) 10 MG tablet Take 10 mg by mouth 2 (two) times daily.   Yes [provider]   metFORMIN (GLUCOPHAGE-XR) 500 MG 24 hr tablet Take 1,000 mg by mouth daily with supper.   Yes [provider]  metoprolol tartrate (LOPRESSOR) 50 MG tablet Take 50 mg by mouth 2 (two) times daily. 03/09/20  Yes [provider]  OMEGA-3 FATTY ACIDS-VITAMIN E PO Take 1 capsule by mouth daily.   Yes [provider]  omeprazole (PRILOSEC) 20 MG capsule Take 20 mg by mouth daily.   Yes [provider]  albuterol (VENTOLIN HFA) 108 (90 Base) MCG/ACT inhaler INHALE 2 PUFFS INTO THE LUNGS EVERY 4 HOURS AS NEEDED FOR WHEEZING OR SHORTNESS OF BREATH 04/18/20   Flora Lipps, MD  BD INSULIN SYRINGE U/F 31G X 5/16" 0.5 ML MISC  10/10/19   [provider]  ergocalciferol (VITAMIN D2) 50000 units capsule Take 50,000 Units by mouth every 30 (thirty) days.    [provider]  pravastatin (PRAVACHOL) 20 MG tablet Take 20 mg by mouth every evening.    [provider]  pregabalin (LYRICA) 25 MG capsule Take 25 mg by mouth 2 (two) times daily.    [provider]  tiotropium (SPIRIVA) 18 MCG inhalation capsule Place 18 mcg into inhaler and inhale daily. 07/31/14   [provider]  vitamin C (ASCORBIC ACID) 500 MG tablet Take 500 mg by mouth daily.    [provider]   Physical Exam: Vitals:   05/14/20 1830 05/14/20 1840 05/14/20 1900 05/14/20 1930  BP: (!) 179/71  (!) 146/56 (!) 157/52  Pulse: 66 73 70 80  Resp: 16 20 (!) 22 19  Temp:  99.8 F (37.7 C)  98.3 F (36.8 C)  TempSrc:    Oral  SpO2: 99% 96% 100% 100%  Weight:      Height:        Wt Readings from Last 3 Encounters:  05/14/20 101 kg  05/12/20 101.2 kg  05/03/20 103.1 kg     . General:  Appears calm and comfortable . Eyes: PERRL, normal lids, irises & conjunctiva . ENT: grossly normal hearing, lips & tongue . Neck: no LAD, masses or thyromegaly . Cardiovascular: regular with occasional PVCs, 1/6 systolic murmur. No LE edema. JVD very difficult to assess  due to habitus, but using hand manometry appears to be elevated to around the jaw line . Telemetry: SR, frequent PVCs  . Respiratory: Normal respiratory effort, normal breath sounds without wheezes in upper lung fields, ?faint crackles and decreased breath sounds at bilateral bases . Abdomen: soft, ntnd . Skin: no rash or induration seen on limited exam . Musculoskeletal: grossly normal tone BUE/BLE . Psychiatric: grossly normal mood and affect, speech fluent and appropriate . Neurologic: grossly non-focal.          Labs on Admission:  Basic Metabolic Panel: Recent Labs  Lab 05/14/20 1358  NA 135  K 4.0  CL 94*  CO2 31  GLUCOSE 420*  BUN 37*  CREATININE 1.83*  CALCIUM 8.3*   Liver Function Tests: Recent Labs  Lab 05/14/20 1358  AST 29  ALT 35  ALKPHOS 109  BILITOT 1.1  PROT 6.2*  ALBUMIN 3.2*   No results for input(s): LIPASE, AMYLASE in the last 168 hours. No results for input(s): AMMONIA in the last 168 hours. CBC: Recent Labs  Lab 05/14/20 1358  WBC 15.3*  NEUTROABS 13.2*  HGB 11.1*  HCT 35.3*  MCV 86.9  PLT 167   Cardiac Enzymes: No results for input(s): CKTOTAL, CKMB, CKMBINDEX, TROPONINI in the last 168 hours.  BNP (last 3 results) Recent Labs    09/27/19 1043 05/01/20 1023 05/14/20 2029  BNP 382.0* 358.1* 336.0*    ProBNP (last 3 results) No results for input(s): PROBNP in the last 8760 hours.  CBG: Recent Labs  Lab 05/14/20 1931  GLUCAP 400*    Radiological Exams on Admission: CT Head Wo Contrast  Result Date: 05/14/2020 CLINICAL DATA:  Headaches EXAM: CT HEAD WITHOUT CONTRAST TECHNIQUE: Contiguous axial images were obtained from the base of the skull through the vertex without intravenous contrast. COMPARISON:  None. FINDINGS: Brain: Mild atrophic changes and white matter ischemic change are seen. No findings to suggest acute hemorrhage, acute infarction or space-occupying mass lesion are noted. Vascular: No hyperdense vessel or  unexpected calcification. Skull:  Normal. Negative for fracture or focal lesion. Sinuses/Orbits: No acute finding. Other: None. IMPRESSION: Chronic atrophic and ischemic changes without acute abnormality. Electronically Signed   By: Inez Catalina M.D.   On: 05/14/2020 16:02   CT Angio Chest PE W and/or Wo Contrast  Result Date: 05/14/2020 CLINICAL DATA:  79 year old male with concern for pulmonary embolism. EXAM: CT ANGIOGRAPHY CHEST WITH CONTRAST TECHNIQUE: Multidetector CT imaging of the chest was performed using the standard protocol during bolus administration of intravenous contrast. Multiplanar CT image reconstructions and MIPs were obtained to evaluate the vascular anatomy. CONTRAST:  83mL OMNIPAQUE IOHEXOL 350 MG/ML SOLN COMPARISON:  Chest CT dated 07/14/2019. FINDINGS: Cardiovascular: There is no cardiomegaly or pericardial effusion. There is coronary vascular calcification. Moderate atherosclerotic calcification of the thoracic aorta. No aneurysmal dilatation. No pulmonary artery embolus identified. Mediastinum/Nodes: There is no hilar or mediastinal adenopathy. The esophagus is grossly unremarkable. No mediastinal fluid collection. Lungs/Pleura: Small bilateral pleural effusions with associated partial compressive atelectasis the lower lobes. There is diffuse interstitial and interlobular septal prominence and diffuse ground-glass opacity throughout the lungs which most consistent with edema. Pneumonia is not excluded clinical correlation is recommended. There is no pneumothorax. The central airways are patent. Upper Abdomen: No acute abnormality. Musculoskeletal: Degenerative changes of the spine. No acute osseous pathology. Old right posterior rib fractures. Median sternotomy wires. Review of the MIP images confirms the above findings. IMPRESSION: 1. No CT evidence of pulmonary artery embolus. 2. Small bilateral pleural effusions with associated partial compressive atelectasis of the lower lobes. 3.  Diffuse interstitial and interlobular septal prominence and diffuse ground-glass opacity throughout the lungs most consistent with edema. Pneumonia is not excluded clinical correlation is recommended. 4. Aortic Atherosclerosis (ICD10-I70.0). Electronically Signed   By: Anner Crete M.D.   On: 05/14/2020 16:16   DG Chest Port 1 View  Result Date: 05/14/2020 CLINICAL DATA:  Worst headache of life, worsened headache with coughing, onset of headache after taking blood pressure medication this morning EXAM: PORTABLE CHEST 1 VIEW COMPARISON:  Portable exam 1409 hours compared to 05/01/2020 FINDINGS: Normal heart size post median sternotomy. Mediastinal contours and pulmonary vascularity normal. Chronic accentuation of basilar markings unchanged. No definite acute infiltrate, pleural effusion or pneumothorax. Old RIGHT rib fractures. IMPRESSION: No acute abnormalities. Electronically Signed   By: Lavonia Dana M.D.   On: 05/14/2020 14:29    EKG: Independently reviewed.  A. 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  Compared to prior conduction delay is now longer with new right sided fascicular block that was not seen previously.  Assessment/Plan Active Problems:   OSA on CPAP   Type II diabetes mellitus with renal manifestations (HCC)   HTN (hypertension)   CAD (coronary artery disease)   Atrial flutter (HCC)   Acute on chronic diastolic CHF (congestive heart failure) (HCC)   CKD (chronic kidney disease), stage IIIa   Atrial fibrillation, chronic (HCC)   HCAP (healthcare-associated pneumonia)   #Acute on chronic hypoxemic respiratory failure #Acute on chronic diastolic heart failure Patient presenting with acute hypoxemic respiratory failure in setting of complex past medical history and recent admission for combination of COPD exacerbation, heart failure exacerbation, and pneumonia. On  initial presentation thought to have pneumonia however now with more lab data I think his presentation is more consistent with heart failure. His CT shows signs were consistent with edema and his procalcitonin is negative making infection less likely. We will  trial diuresis and reassess. -Telemetry -Lasix 20 mg IV twice daily -Daily weights -Discontinue antibiotics  #EKG changes Patient denies any chest pain however does have new worsening bundle branch blocks on his EKG and reportedly has a history of mild to no symptoms with cardiac ischemia in the past. Will obtain troponins although they must be interpreted with caution as I do believe he is currently volume overloaded in addition to having uncontrolled hypertension. Given lack of ST changes not urgent, but would discuss with cardiology in the a.m. and see if repeat echocardiogram (although recently done 2 weeks ago) would be worthwhile to look for new wall motion abnormalities. -Trend troponin  #Chronic medical problems Hypertension: Continue amlodipine, lisinopril A flutter/A. fib: Continue Eliquis, metoprolol CAD: Continue aspirin, atorvastatin Depression/anxiety: Continue citalopram, diazepam COPD: Continue fluticasone-salmeterol, Spiriva, albuterol DM: Hold metformin, reduce home Lantus to 25 units and home short acting insulin to 10 units 3 times daily along with sliding scale GERD: Continue PPI Neuropathy: Continue pregabalin  Code Status: DNR/DNI, confirmed with patient DVT Prophylaxis: On Eliquis Family Communication: Wife updated at bedside Disposition Plan: Admit to inpatient   Time spent: 44 min  Clarnce Flock MD/MPH [facility]

## 2020-05-15 ENCOUNTER — Other Ambulatory Visit: Payer: Self-pay

## 2020-05-15 DIAGNOSIS — J9621 Acute and chronic respiratory failure with hypoxia: Secondary | ICD-10-CM | POA: Diagnosis not present

## 2020-05-15 DIAGNOSIS — I482 Chronic atrial fibrillation, unspecified: Secondary | ICD-10-CM | POA: Diagnosis not present

## 2020-05-15 DIAGNOSIS — I2510 Atherosclerotic heart disease of native coronary artery without angina pectoris: Secondary | ICD-10-CM | POA: Diagnosis not present

## 2020-05-15 DIAGNOSIS — I5033 Acute on chronic diastolic (congestive) heart failure: Secondary | ICD-10-CM | POA: Diagnosis not present

## 2020-05-15 LAB — GLUCOSE, CAPILLARY
Glucose-Capillary: 369 mg/dL — ABNORMAL HIGH (ref 70–99)
Glucose-Capillary: 270 mg/dL — ABNORMAL HIGH (ref 70–99)
Glucose-Capillary: 165 mg/dL — ABNORMAL HIGH (ref 70–99)
Glucose-Capillary: 212 mg/dL — ABNORMAL HIGH (ref 70–99)

## 2020-05-15 LAB — BASIC METABOLIC PANEL
Anion gap: 8 (ref 5–15)
BUN: 37 mg/dL — ABNORMAL HIGH (ref 8–23)
CO2: 33 mmol/L — ABNORMAL HIGH (ref 22–32)
Calcium: 8.2 mg/dL — ABNORMAL LOW (ref 8.9–10.3)
Chloride: 96 mmol/L — ABNORMAL LOW (ref 98–111)
Creatinine, Ser: 1.91 mg/dL — ABNORMAL HIGH (ref 0.61–1.24)
GFR, Estimated: 35 mL/min — ABNORMAL LOW (ref >60)
Glucose, Bld: 474 mg/dL — ABNORMAL HIGH (ref 70–99)
Potassium: 4 mmol/L (ref 3.5–5.1)
Sodium: 137 mmol/L (ref 135–145)

## 2020-05-15 LAB — CBC
HCT: 32.3 % — ABNORMAL LOW (ref 39.0–52.0)
Hemoglobin: 10.1 g/dL — ABNORMAL LOW (ref 13.0–17.0)
MCH: 27.4 pg (ref 26.0–34.0)
MCHC: 31.3 g/dL (ref 30.0–36.0)
MCV: 87.5 fL (ref 80.0–100.0)
Platelets: 153 10*3/uL (ref 150–400)
RBC: 3.69 MIL/uL — ABNORMAL LOW (ref 4.22–5.81)
RDW: 16.9 % — ABNORMAL HIGH (ref 11.5–15.5)
WBC: 12.1 10*3/uL — ABNORMAL HIGH (ref 4.0–10.5)
nRBC: 0 % (ref 0.0–0.2)

## 2020-05-15 LAB — HEPARIN LEVEL (UNFRACTIONATED): Heparin Unfractionated: 1.23 IU/mL — ABNORMAL HIGH (ref 0.30–0.70)

## 2020-05-15 LAB — APTT
aPTT: 28 seconds (ref 24–36)
aPTT: 42 seconds — ABNORMAL HIGH (ref 24–36)

## 2020-05-15 LAB — PROCALCITONIN: Procalcitonin: <0.1 ng/mL

## 2020-05-15 LAB — TROPONIN I (HIGH SENSITIVITY)
Troponin I (High Sensitivity): 785 ng/L (ref <18)
Troponin I (High Sensitivity): 616 ng/L (ref <18)

## 2020-05-15 MED ORDER — HEPARIN (PORCINE) 25000 UT/250ML-% IV SOLN
1600.0000 [IU]/h | INTRAVENOUS | Status: DC
  Administered 2020-05-15: 11:00:00 1100 [IU]/h via INTRAVENOUS
  Administered 2020-05-16: 23:00:00 1700 [IU]/h via INTRAVENOUS
  Administered 2020-05-16: 05:00:00 1500 [IU]/h via INTRAVENOUS
  Filled 2020-05-15 (×3): qty 250

## 2020-05-15 NOTE — Progress Notes (Signed)
ANTICOAGULATION CONSULT NOTE   Pharmacy Consult for Heparin drip Indication: chest pain/ACS  Allergies  Allergen Reactions  . Codeine Hives, Rash and Swelling  . Hydralazine Other (See Comments)    tongue swollen and couldn't wake up ---- not positive it was this or a mix of this with something else or high sugar  . Hydrocodone-Acetaminophen Other (See Comments)  . Penicillins Other (See Comments)    Passed out (at 79 yrs old) Has patient had a PCN reaction causing immediate rash, facial/tongue/throat swelling, SOB or lightheadedness with hypotension: No Has patient had a PCN reaction causing severe rash involving mucus membranes or skin necrosis: No Has patient had a PCN reaction that required hospitalization No Has patient had a PCN reaction occurring within the last 10 years: No If all of the above answers are "NO", then may proceed with Cephalosporin use.     Patient Measurements: Height: 6' (182.9 cm) Weight: 104.1 kg (229 lb 8 oz) IBW/kg (Calculated) : 77.6 Heparin Dosing Weight: 99.1 kg  Vital Signs: Temp: 97.7 F (36.5 C) (12/25 1653) Temp Source: Oral (12/25 1653) BP: 158/93 (12/25 1653) Pulse Rate: 65 (12/25 1653)  Labs: Recent Labs    05/14/20 1358 05/15/20 0455 05/15/20 0855 05/15/20 1021 05/15/20 1548  HGB 11.1*  --   --  10.1*  --   HCT 35.3*  --   --  32.3*  --   PLT 167  --   --  153  --   APTT  --   --   --  28 42*  LABPROT 14.6  --   --   --   --   INR 1.2  --   --   --   --   HEPARINUNFRC  --   --   --  1.23*  --   CREATININE 1.83* 1.91*  --   --   --   TROPONINIHS  --   --  785* 616*  --     Estimated Creatinine Clearance: 39.8 mL/min (A) (by C-G formula based on SCr of 1.91 mg/dL (H)).   Medical History: Past Medical History:  Diagnosis Date  . Atrial fibrillation (HCC)   . Basal cell carcinoma 06/2017   Left Ear  . CAD (coronary artery disease)   . CKD (chronic kidney disease)   . Congestive heart failure (HCC)   . COPD (chronic  obstructive pulmonary disease) (HCC)   . Diabetes (HCC)   . Hypertension   . OSA on CPAP   . Squamous carcinoma 06/2017   head and nose    Medications:  Scheduled:  . albuterol  1 puff Inhalation Q4H  . amLODipine  5 mg Oral Daily  . vitamin C  500 mg Oral Daily  . aspirin EC  81 mg Oral Daily  . atorvastatin  20 mg Oral Daily  . citalopram  20 mg Oral QHS  . fluticasone furoate-vilanterol  1 puff Inhalation Daily  . furosemide  20 mg Intravenous BID  . insulin aspart  0-15 Units Subcutaneous TID WC  . insulin aspart  10 Units Subcutaneous TID WC  . insulin glargine  25 Units Subcutaneous QHS  . lisinopril  10 mg Oral BID  . metoprolol tartrate  50 mg Oral BID  . pantoprazole  40 mg Oral Daily  . pregabalin  25 mg Oral BID  . sodium chloride flush  3 mL Intravenous Q12H  . tiotropium  18 mcg Inhalation Daily  . vitamin B-12  500 mcg Oral  Daily  . [START ON 05/18/2020] Vitamin D (Ergocalciferol)  50,000 Units Oral Q30 days   Infusions:  . sodium chloride    . heparin Stopped (05/15/20 1739)    Assessment: 79 y.o. male presenting with SOB and found to have c/f ACS/STEMI. Patient was initially continued on home dose of apixaban (2.5mg  bid for afib, last dose 12/25 0855). Pharmacy has now been consulted for IV heparin dosing. Baseline heparin level elevated as expected at 1.23 due to time of last apixaban dose. Baseline aPTT 28.  Will dose heparin infusion based on aPTT until levels correlate.  Initial aPTT ordered approximately 6 hours after start of infusion. aPTT is below goal range, as can be expected since heparin likely still accumulating. Even so, will conservatively increase heparin infusion rate as aPTT should be closer to goal range 6 hours into the infusion. Hgb low normal and plt WNL. No issues with the infusion or overt bleeding noted.   Goal of Therapy:  Heparin level 0.3-0.7 units/ml aPTT 66-102 Monitor platelets by anticoagulation protocol: Yes  Plan:   Increase heparin infusion to 1300 units/hr Check 8 hour aPTT Monitor CBC, daily heparin level and aPTT until levels correlate Continue to monitor for signs/symptoms of bleeding F/u transition back to oral anticoagulant   Brendolyn Patty, PharmD Clinical Pharmacist  05/15/2020   5:46 PM

## 2020-05-15 NOTE — Progress Notes (Addendum)
ANTICOAGULATION CONSULT NOTE - Initial Consult  Pharmacy Consult for Heparin drip Indication: chest pain/ACS  Allergies  Allergen Reactions  . Codeine Hives, Rash and Swelling  . Hydralazine Other (See Comments)    tongue swollen and couldn't wake up ---- not positive it was this or a mix of this with something else or high sugar  . Hydrocodone-Acetaminophen Other (See Comments)  . Penicillins Other (See Comments)    Passed out (at 79 yrs old) Has patient had a PCN reaction causing immediate rash, facial/tongue/throat swelling, SOB or lightheadedness with hypotension: No Has patient had a PCN reaction causing severe rash involving mucus membranes or skin necrosis: No Has patient had a PCN reaction that required hospitalization No Has patient had a PCN reaction occurring within the last 10 years: No If all of the above answers are "NO", then may proceed with Cephalosporin use.     Patient Measurements: Height: 6' (182.9 cm) Weight: 104.1 kg (229 lb 8 oz) IBW/kg (Calculated) : 77.6 Heparin Dosing Weight: 99.1 kg  Vital Signs: Temp: 97.8 F (36.6 C) (12/25 0542) Temp Source: Oral (12/25 0542) BP: 157/69 (12/25 0542) Pulse Rate: 63 (12/25 0542)  Labs: Recent Labs    05/14/20 1358 05/15/20 0455 05/15/20 0855 05/15/20 1021  HGB 11.1*  --   --  10.1*  HCT 35.3*  --   --  32.3*  PLT 167  --   --  153  APTT  --   --   --  28  LABPROT 14.6  --   --   --   INR 1.2  --   --   --   CREATININE 1.83* 1.91*  --   --   TROPONINIHS  --   --  785*  --     Estimated Creatinine Clearance: 39.8 mL/min (A) (by C-G formula based on SCr of 1.91 mg/dL (H)).   Medical History: Past Medical History:  Diagnosis Date  . Atrial fibrillation ([place])   . Basal cell carcinoma 06/2017   Left Ear  . CAD (coronary artery disease)   . CKD (chronic kidney disease)   . Congestive heart failure ([place])   . COPD (chronic obstructive pulmonary disease) ([place])   . Diabetes ([place])   . Hypertension   .  OSA on CPAP   . Squamous carcinoma 06/2017   head and nose    Medications:  Scheduled:  . albuterol  1 puff Inhalation Q4H  . amLODipine  5 mg Oral Daily  . vitamin C  500 mg Oral Daily  . aspirin EC  81 mg Oral Daily  . atorvastatin  20 mg Oral Daily  . citalopram  20 mg Oral QHS  . fluticasone furoate-vilanterol  1 puff Inhalation Daily  . furosemide  20 mg Intravenous BID  . insulin aspart  0-15 Units Subcutaneous TID WC  . insulin aspart  10 Units Subcutaneous TID WC  . insulin glargine  25 Units Subcutaneous QHS  . lisinopril  10 mg Oral BID  . metoprolol tartrate  50 mg Oral BID  . pantoprazole  40 mg Oral Daily  . pregabalin  25 mg Oral BID  . sodium chloride flush  3 mL Intravenous Q12H  . tiotropium  18 mcg Inhalation Daily  . vitamin B-12  500 mcg Oral Daily  . [START ON 05/18/2020] Vitamin D (Ergocalciferol)  50,000 Units Oral Q30 days   Infusions:  . sodium chloride    . heparin 1,100 Units/hr (05/15/20 1038)    Assessment:  79 yo male to start Heparin drip for ACS/STEMI. Patient on apixaban PTA. Last dose of apixaban given this am 12/25 at 0855 per MAR. Hgb 10.1  plt 153  APTT 28   Baseline Heparin level=1.23 (elevated d/t apixaban)  No INR ordered due to effect of apixaban on INR.    Goal of Therapy:  APTT 66-102  Heparin level 0.3-0.7 units/ml Monitor platelets by anticoagulation protocol: Yes   Plan:  No bolus Start heparin infusion at 1100 units/hr Check anti-Xa level in 8 hours and daily while on heparin Continue to monitor H&H and platelets   12/25- Heparin level 1.23 (d/t apixaban) therefore will follow aPTTs until aPTT correlates with Heparin level (will check Heparin level once daily to assess for correlation)  Rossie Bretado A 05/15/2020,10:45 AM

## 2020-05-15 NOTE — Progress Notes (Signed)
1         at [facility]   PATIENT NAME: Ernest Mclean    MR#:  [id number]  DATE OF BIRTH:  07-15-1940  SUBJECTIVE:  CHIEF COMPLAINT:   Chief Complaint  Patient presents with  . Headache  Feeling much better, sitting in the bed.  Headache resolved.  Still feels little bit short of breath all the whole lot better. REVIEW OF SYSTEMS:  Review of Systems  Constitutional: Negative for diaphoresis, fever, malaise/fatigue and weight loss.  HENT: Negative for ear discharge, ear pain, hearing loss, nosebleeds, sore throat and tinnitus.   Eyes: Negative for blurred vision and pain.  Respiratory: Positive for shortness of breath. Negative for cough, hemoptysis and wheezing.   Cardiovascular: Negative for chest pain, palpitations, orthopnea and leg swelling.  Gastrointestinal: Negative for abdominal pain, blood in stool, constipation, diarrhea, heartburn, nausea and vomiting.  Genitourinary: Negative for dysuria, frequency and urgency.  Musculoskeletal: Negative for back pain and myalgias.  Skin: Negative for itching and rash.  Neurological: Positive for headaches. Negative for dizziness, tingling, tremors, focal weakness, seizures and weakness.  Psychiatric/Behavioral: Negative for depression. The patient is not nervous/anxious.    DRUG ALLERGIES:   Allergies  Allergen Reactions  . Codeine Hives, Rash and Swelling  . Hydralazine Other (See Comments)    tongue swollen and couldn't wake up ---- not positive it was this or a mix of this with something else or high sugar  . Hydrocodone-Acetaminophen Other (See Comments)  . Penicillins Other (See Comments)    Passed out (at 79 yrs old) Has patient had a PCN reaction causing immediate rash, facial/tongue/throat swelling, SOB or lightheadedness with hypotension: No Has patient had a PCN reaction causing severe rash involving mucus membranes or skin necrosis: No Has patient had a PCN reaction that required hospitalization No Has  patient had a PCN reaction occurring within the last 10 years: No If all of the above answers are "NO", then may proceed with Cephalosporin use.    VITALS:  Blood pressure (!) 157/69, pulse 63, temperature 97.8 F (36.6 C), temperature source Oral, resp. rate 16, height 6' (1.829 m), weight 104.1 kg, SpO2 97 %. PHYSICAL EXAMINATION:  Physical Exam Constitutional:      Appearance: He is obese.  HENT:     Head: Normocephalic and atraumatic.  Eyes:     Extraocular Movements: EOM normal.     Conjunctiva/sclera: Conjunctivae normal.     Pupils: Pupils are equal, round, and reactive to light.  Neck:     Thyroid: No thyromegaly.     Trachea: No tracheal deviation.  Cardiovascular:     Rate and Rhythm: Normal rate and regular rhythm.     Heart sounds: Normal heart sounds.  Pulmonary:     Effort: Pulmonary effort is normal. No respiratory distress.     Breath sounds: Normal breath sounds. No wheezing.  Chest:     Chest wall: No tenderness.  Abdominal:     General: Bowel sounds are normal. There is no distension.     Palpations: Abdomen is soft.     Tenderness: There is no abdominal tenderness.  Musculoskeletal:        General: Normal range of motion.     Cervical back: Normal range of motion and neck supple.  Skin:    General: Skin is warm and dry.     Findings: No rash.  Neurological:     Mental Status: He is alert and oriented to person, place, and time.  Cranial Nerves: No cranial nerve deficit.    LABORATORY PANEL:  Male CBC Recent Labs  Lab 05/15/20 1021  WBC 12.1*  HGB 10.1*  HCT 32.3*  PLT 153   ------------------------------------------------------------------------------------------------------------------ Chemistries  Recent Labs  Lab 05/14/20 1358 05/15/20 0455  NA 135 137  K 4.0 4.0  CL 94* 96*  CO2 31 33*  GLUCOSE 420* 474*  BUN 37* 37*  CREATININE 1.83* 1.91*  CALCIUM 8.3* 8.2*  AST 29  --   ALT 35  --   ALKPHOS 109  --   BILITOT 1.1  --     RADIOLOGY:  CT Head Wo Contrast  Result Date: 05/14/2020 CLINICAL DATA:  Headaches EXAM: CT HEAD WITHOUT CONTRAST TECHNIQUE: Contiguous axial images were obtained from the base of the skull through the vertex without intravenous contrast. COMPARISON:  None. FINDINGS: Brain: Mild atrophic changes and white matter ischemic change are seen. No findings to suggest acute hemorrhage, acute infarction or space-occupying mass lesion are noted. Vascular: No hyperdense vessel or unexpected calcification. Skull: Normal. Negative for fracture or focal lesion. Sinuses/Orbits: No acute finding. Other: None. IMPRESSION: Chronic atrophic and ischemic changes without acute abnormality. Electronically Signed   By: Inez Catalina M.D.   On: 05/14/2020 16:02   CT Angio Chest PE W and/or Wo Contrast  Result Date: 05/14/2020 CLINICAL DATA:  79 year old male with concern for pulmonary embolism. EXAM: CT ANGIOGRAPHY CHEST WITH CONTRAST TECHNIQUE: Multidetector CT imaging of the chest was performed using the standard protocol during bolus administration of intravenous contrast. Multiplanar CT image reconstructions and MIPs were obtained to evaluate the vascular anatomy. CONTRAST:  25mL OMNIPAQUE IOHEXOL 350 MG/ML SOLN COMPARISON:  Chest CT dated 07/14/2019. FINDINGS: Cardiovascular: There is no cardiomegaly or pericardial effusion. There is coronary vascular calcification. Moderate atherosclerotic calcification of the thoracic aorta. No aneurysmal dilatation. No pulmonary artery embolus identified. Mediastinum/Nodes: There is no hilar or mediastinal adenopathy. The esophagus is grossly unremarkable. No mediastinal fluid collection. Lungs/Pleura: Small bilateral pleural effusions with associated partial compressive atelectasis the lower lobes. There is diffuse interstitial and interlobular septal prominence and diffuse ground-glass opacity throughout the lungs which most consistent with edema. Pneumonia is not excluded clinical  correlation is recommended. There is no pneumothorax. The central airways are patent. Upper Abdomen: No acute abnormality. Musculoskeletal: Degenerative changes of the spine. No acute osseous pathology. Old right posterior rib fractures. Median sternotomy wires. Review of the MIP images confirms the above findings. IMPRESSION: 1. No CT evidence of pulmonary artery embolus. 2. Small bilateral pleural effusions with associated partial compressive atelectasis of the lower lobes. 3. Diffuse interstitial and interlobular septal prominence and diffuse ground-glass opacity throughout the lungs most consistent with edema. Pneumonia is not excluded clinical correlation is recommended. 4. Aortic Atherosclerosis (ICD10-I70.0). Electronically Signed   By: Anner Crete M.D.   On: 05/14/2020 16:16   DG Chest Port 1 View  Result Date: 05/14/2020 CLINICAL DATA:  Worst headache of life, worsened headache with coughing, onset of headache after taking blood pressure medication this morning EXAM: PORTABLE CHEST 1 VIEW COMPARISON:  Portable exam 1409 hours compared to 05/01/2020 FINDINGS: Normal heart size post median sternotomy. Mediastinal contours and pulmonary vascularity normal. Chronic accentuation of basilar markings unchanged. No definite acute infiltrate, pleural effusion or pneumothorax. Old RIGHT rib fractures. IMPRESSION: No acute abnormalities. Electronically Signed   By: Lavonia Dana M.D.   On: 05/14/2020 14:29   ASSESSMENT AND PLAN:  79 year old male with a known history of coronary disease, diastolic CHF, A. fib,  CKD, OSA, type 2 diabetes, COPD on 2 L oxygen chronically was admitted for headache and some chest tightness/shortness of breath  Acute on chronic hypoxic respiratory failure Acute on chronic diastolic heart failure He required 4 L oxygen via nasal cannula in the ED and normally he is on 2 L at home Wean oxygen as able This is likely multifactorial from underlying COPD, heart failure  exacerbation, OSA, pulmonary edema/fluid overload He seemed to have responded well to diuresis and feeling quite back to baseline  Elevated troponin 186->190->785 Cardiology consult with Dr. Nehemiah Massed.  Stop Eliquis and start him on heparin drip per cardiology Monitor on telemetry  Headache Transient and now resolved likely due to carbon dioxide retention from OSA  Hypertension: Continue amlodipine, lisinopril A flutter/A. fib: Continue metoprolol, held Eliquis to transition to heparin drip per cardiology.  Rate controlled CAD: Continue aspirin, atorvastatin Depression/anxiety: Continue citalopram, diazepam COPD: Continue fluticasone-salmeterol, Spiriva, albuterol DM: Hold metformin, reduce home Lantus to 25 units and home short acting insulin to 10 units 3 times daily along with sliding scale GERD: Continue PPI Neuropathy: Continue pregabalin Obesity Body mass index is 31.13 kg/m.      Status is: Inpatient  Remains inpatient appropriate because:Inpatient level of care appropriate due to severity of illness   Dispo: The patient is from: Home              Anticipated d/c is to: Home              Anticipated d/c date is: 2 days              Patient currently is not medically stable to d/c.  Await cardiology evaluation     DVT prophylaxis:        Heparin full dose    Family Communication: ( "discussed with patient")   All the records are reviewed and case discussed with Care Management/Social Worker. Management plans discussed with the patient, nursing and they are in agreement.  CODE STATUS: DNR  TOTAL TIME TAKING CARE OF THIS PATIENT: 35 minutes.   More than 50% of the time was spent in counseling/coordination of care: YES  POSSIBLE D/C IN 1-2 DAYS, DEPENDING ON CLINICAL CONDITION.  And cardiology evaluation   Max Sane M.D on 05/15/2020 at 12:31 PM  [facility]   CC: Primary care physician; Juluis Pitch, MD  Note: This dictation was prepared  with Dragon dictation along with smaller phrase technology. Any transcriptional errors that result from this process are unintentional.

## 2020-05-15 NOTE — Plan of Care (Signed)
Continuing with plan of care. 

## 2020-05-16 DIAGNOSIS — I4892 Unspecified atrial flutter: Secondary | ICD-10-CM | POA: Diagnosis not present

## 2020-05-16 DIAGNOSIS — I482 Chronic atrial fibrillation, unspecified: Secondary | ICD-10-CM | POA: Diagnosis not present

## 2020-05-16 DIAGNOSIS — R778 Other specified abnormalities of plasma proteins: Secondary | ICD-10-CM

## 2020-05-16 DIAGNOSIS — I5033 Acute on chronic diastolic (congestive) heart failure: Secondary | ICD-10-CM | POA: Diagnosis not present

## 2020-05-16 DIAGNOSIS — I2510 Atherosclerotic heart disease of native coronary artery without angina pectoris: Secondary | ICD-10-CM | POA: Diagnosis not present

## 2020-05-16 LAB — CBC
HCT: 32.2 % — ABNORMAL LOW (ref 39.0–52.0)
Hemoglobin: 10.2 g/dL — ABNORMAL LOW (ref 13.0–17.0)
MCH: 27.6 pg (ref 26.0–34.0)
MCHC: 31.7 g/dL (ref 30.0–36.0)
MCV: 87 fL (ref 80.0–100.0)
Platelets: 154 10*3/uL (ref 150–400)
RBC: 3.7 MIL/uL — ABNORMAL LOW (ref 4.22–5.81)
RDW: 16.6 % — ABNORMAL HIGH (ref 11.5–15.5)
WBC: 15.5 10*3/uL — ABNORMAL HIGH (ref 4.0–10.5)
nRBC: 0 % (ref 0.0–0.2)

## 2020-05-16 LAB — BASIC METABOLIC PANEL
Anion gap: 6 (ref 5–15)
BUN: 35 mg/dL — ABNORMAL HIGH (ref 8–23)
CO2: 34 mmol/L — ABNORMAL HIGH (ref 22–32)
Calcium: 8 mg/dL — ABNORMAL LOW (ref 8.9–10.3)
Chloride: 96 mmol/L — ABNORMAL LOW (ref 98–111)
Creatinine, Ser: 1.62 mg/dL — ABNORMAL HIGH (ref 0.61–1.24)
GFR, Estimated: 43 mL/min — ABNORMAL LOW (ref >60)
Glucose, Bld: 173 mg/dL — ABNORMAL HIGH (ref 70–99)
Potassium: 3.5 mmol/L (ref 3.5–5.1)
Sodium: 136 mmol/L (ref 135–145)

## 2020-05-16 LAB — GLUCOSE, CAPILLARY
Glucose-Capillary: 160 mg/dL — ABNORMAL HIGH (ref 70–99)
Glucose-Capillary: 206 mg/dL — ABNORMAL HIGH (ref 70–99)
Glucose-Capillary: 285 mg/dL — ABNORMAL HIGH (ref 70–99)
Glucose-Capillary: 278 mg/dL — ABNORMAL HIGH (ref 70–99)

## 2020-05-16 LAB — APTT
aPTT: 50 seconds — ABNORMAL HIGH (ref 24–36)
aPTT: 61 seconds — ABNORMAL HIGH (ref 24–36)

## 2020-05-16 LAB — HEPARIN LEVEL (UNFRACTIONATED)
Heparin Unfractionated: 0.9 IU/mL — ABNORMAL HIGH (ref 0.30–0.70)
Heparin Unfractionated: 0.63 IU/mL (ref 0.30–0.70)

## 2020-05-16 LAB — PROCALCITONIN: Procalcitonin: <0.1 ng/mL

## 2020-05-16 MED ORDER — SODIUM CHLORIDE 0.9% FLUSH
3.0000 mL | Freq: Two times a day (BID) | INTRAVENOUS | Status: DC
  Administered 2020-05-16 – 2020-05-19 (×7): 3 mL via INTRAVENOUS

## 2020-05-16 NOTE — Consult Note (Signed)
[facility] Consultation Note  Patient ID: Ernest Mclean, MRN: [id number], DOB/AGE: 05-23-1940 79 y.o. Admit date: 05/14/2020   Date of Consult: 05/16/2020 Primary Physician: Juluis Pitch, MD Primary Cardiologist: [facility]  Chief Complaint:  Chief Complaint  Patient presents with  . Headache   Reason for Consult: Shortness of breath chest pain with elevated troponin  HPI: 79 y.o. male known coronary artery disease status post coronary bypass graft previous myocardial infarction paroxysmal nonvalvular atrial flutter chronic kidney disease stage III hypertension hyperlipidemia D diabetes sleep apnea and COPD with oxygen requirement who has had significant headache and worsening shortness of breath weakness fatigue and inability to do the things he wishes over the last several weeks.  This is significant to the current extent that the patient has had a higher oxygen requirement.  Upon admission to the hospital the patient had the symptoms with a troponin level of 785/616 and a glomerular filtration rate of 35.  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  With this there has been concerns that the patient has had a significant exacerbation of coronary artery disease and an bypass graft causing current issues and will need further evaluation and treatment options.  We have discussed at length continue medication management including diuresis with intravenous Lasix which has improved the patient but also for further evaluation of the possibility of bypass graft and cardiac catheterization.  Patient understands the risk and benefits of cardiac atomization.  This includes above but only of death stroke heart attack infection bleeding or blood clot.  He is at low risk for conscious sedation  Past Medical History:  Diagnosis Date  .  Atrial fibrillation ([place])   . Basal cell carcinoma 06/2017   Left Ear  . CAD (coronary artery disease)   . CKD (chronic kidney disease)   . Congestive heart failure ([place])   . COPD (chronic obstructive pulmonary disease) ([place])   . Diabetes ([place])   . Hypertension   . OSA on CPAP   . Squamous carcinoma 06/2017   head and nose      Surgical History:  Past Surgical History:  Procedure Laterality Date  . CLAVICLE SURGERY    . heart bypass    . HERNIA REPAIR    . MOHS SURGERY  07/10/2017   Head     Home Meds: Prior to Admission medications   Medication Sig Start Date End Date Taking? Authorizing Provider  amLODipine (NORVASC) 5 MG tablet Take 5 mg by mouth daily.  08/17/14  Yes [provider]  apixaban (ELIQUIS) 2.5 MG TABS tablet Take 2.5 mg by mouth 2 (two) times daily.   Yes [provider]  aspirin EC 81 MG tablet Take 81 mg by mouth daily. 08/17/14  Yes [provider]  atorvastatin (LIPITOR) 20 MG tablet Take 20 mg by mouth daily. 10/08/19  Yes [provider]  azithromycin (ZITHROMAX) 250 MG tablet Take 250 mg by mouth daily. 05/13/20  Yes [provider]  citalopram (CELEXA) 20 MG tablet Take 20 mg by mouth at bedtime.  07/30/14  Yes [provider]  CRANBERRY PO Take 820 mg by mouth daily.   Yes [provider]  cyanocobalamin 500 MCG tablet Take 500 mcg by mouth daily.   Yes [provider]  diazepam (VALIUM) 5 MG tablet Take 5 mg by mouth every 12 (  twelve) hours as needed for anxiety.  10/24/13  Yes [provider]  Fluticasone-Salmeterol (WIXELA INHUB) 500-50 MCG/DOSE AEPB Inhale 1 puff into the lungs 2 (two) times daily. 01/12/20  Yes Flora Lipps, MD  furosemide (LASIX) 20 MG tablet Take 20 mg by mouth daily.    Yes [provider]  HUMALOG 100 UNIT/ML injection Inject 10-25 Units into the skin 3 (three) times daily with meals. (sliding scale as needed to a maximum of 150u daily) 06/24/19   Yes [provider]  insulin glargine (LANTUS) 100 UNIT/ML injection Inject 60 Units into the skin at bedtime.    Yes [provider]  lisinopril (ZESTRIL) 10 MG tablet Take 10 mg by mouth 2 (two) times daily.   Yes [provider]  metFORMIN (GLUCOPHAGE-XR) 500 MG 24 hr tablet Take 1,000 mg by mouth daily with supper.   Yes [provider]  metoprolol tartrate (LOPRESSOR) 50 MG tablet Take 50 mg by mouth 2 (two) times daily. 03/09/20  Yes [provider]  OMEGA-3 FATTY ACIDS-VITAMIN E PO Take 1 capsule by mouth daily.   Yes [provider]  omeprazole (PRILOSEC) 20 MG capsule Take 20 mg by mouth daily.   Yes [provider]  pregabalin (LYRICA) 25 MG capsule Take 25 mg by mouth 2 (two) times daily.   Yes [provider]  tiotropium (SPIRIVA) 18 MCG inhalation capsule Place 18 mcg into inhaler and inhale daily. 07/31/14  Yes [provider]  vitamin C (ASCORBIC ACID) 500 MG tablet Take 500 mg by mouth daily.   Yes [provider]  albuterol (VENTOLIN HFA) 108 (90 Base) MCG/ACT inhaler INHALE 2 PUFFS INTO THE LUNGS EVERY 4 HOURS AS NEEDED FOR WHEEZING OR SHORTNESS OF BREATH 04/18/20   Flora Lipps, MD  BD INSULIN SYRINGE U/F 31G X 5/16" 0.5 ML MISC  10/10/19   [provider]  ergocalciferol (VITAMIN D2) 50000 units capsule Take 50,000 Units by mouth every 30 (thirty) days.    [provider]    Inpatient Medications:  . albuterol  1 puff Inhalation Q4H  . amLODipine  5 mg Oral Daily  . vitamin C  500 mg Oral Daily  . aspirin EC  81 mg Oral Daily  . atorvastatin  20 mg Oral Daily  . citalopram  20 mg Oral QHS  . fluticasone furoate-vilanterol  1 puff Inhalation Daily  . furosemide  20 mg Intravenous BID  . insulin aspart  0-15 Units Subcutaneous TID WC  . insulin aspart  10 Units Subcutaneous TID WC  . insulin glargine  25 Units Subcutaneous QHS  . lisinopril  10 mg Oral BID  .  metoprolol tartrate  50 mg Oral BID  . pantoprazole  40 mg Oral Daily  . pregabalin  25 mg Oral BID  . sodium chloride flush  3 mL Intravenous Q12H  . tiotropium  18 mcg Inhalation Daily  . vitamin B-12  500 mcg Oral Daily  . [START ON 05/18/2020] Vitamin D (Ergocalciferol)  50,000 Units Oral Q30 days   . sodium chloride    . heparin 1,500 Units/hr (05/16/20 0827)    Allergies:  Allergies  Allergen Reactions  . Codeine Hives, Rash and Swelling  . Hydralazine Other (See Comments)    tongue swollen and couldn't wake up ---- not positive it was this or a mix of this with something else or high sugar  . Hydrocodone-Acetaminophen Other (See Comments)  . Penicillins Other (See Comments)    Passed  out (at 79 yrs old) Has patient had a PCN reaction causing immediate rash, facial/tongue/throat swelling, SOB or lightheadedness with hypotension: No Has patient had a PCN reaction causing severe rash involving mucus membranes or skin necrosis: No Has patient had a PCN reaction that required hospitalization No Has patient had a PCN reaction occurring within the last 10 years: No If all of the above answers are "NO", then may proceed with Cephalosporin use.     Social History   Socioeconomic History  . Marital status: Married    Spouse name: Not on file  . Number of children: Not on file  . Years of education: Not on file  . Highest education level: Not on file  Occupational History  . Not on file  Tobacco Use  . Smoking status: Former Smoker    Packs/day: 1.00    Years: 30.00    Pack years: 30.00    Types: Cigarettes    Quit date: 12/14/2001    Years since quitting: 18.4  . Smokeless tobacco: Never Used  . Tobacco comment: quit 12/14/2001  Vaping Use  . Vaping Use: Never used  Substance and Sexual Activity  . Alcohol use: No    Alcohol/week: 0.0 standard drinks  . Drug use: No  . Sexual activity: Not on file  Other Topics Concern  . Not on file  Social History Narrative  .  Not on file   Social Determinants of Health   Financial Resource Strain: Not on file  Food Insecurity: Not on file  Transportation Needs: Not on file  Physical Activity: Not on file  Stress: Not on file  Social Connections: Not on file  Intimate Partner Violence: Not on file     Family History  Problem Relation Age of Onset  . Stroke Other   . Diabetes Other   . Breast cancer Other   . Colon cancer Other      Review of Systems Positive for shortness of breath headache chest pressure Negative for: General:  chills, fever, night sweats or weight changes.  Cardiovascular: PND orthopnea syncope dizziness  Dermatological skin lesions rashes Respiratory: Cough congestion Urologic: Frequent urination urination at night and hematuria Abdominal: negative for nausea, vomiting, diarrhea, bright red blood per rectum, melena, or hematemesis Neurologic: negative for visual changes, and/or hearing changes  All other systems reviewed and are otherwise negative except as noted above.  Labs: No results for input(s): CKTOTAL, CKMB, TROPONINI in the last 72 hours. Lab Results  Component Value Date   WBC 15.5 (H) 05/16/2020   HGB 10.2 (L) 05/16/2020   HCT 32.2 (L) 05/16/2020   MCV 87.0 05/16/2020   PLT 154 05/16/2020    Recent Labs  Lab 05/14/20 1358 05/15/20 0455 05/16/20 0233  NA 135   < > 136  K 4.0   < > 3.5  CL 94*   < > 96*  CO2 31   < > 34*  BUN 37*   < > 35*  CREATININE 1.83*   < > 1.62*  CALCIUM 8.3*   < > 8.0*  PROT 6.2*  --   --   BILITOT 1.1  --   --   ALKPHOS 109  --   --   ALT 35  --   --   AST 29  --   --   GLUCOSE 420*   < > 173*   < > = values in this interval not displayed.   Lab Results  Component Value Date  CHOL 101 09/28/2019   HDL 28 (L) 09/28/2019   LDLCALC 60 09/28/2019   TRIG 67 09/28/2019   No results found for: DDIMER  Radiology/Studies:  DG Chest 2 View  Result Date: 05/01/2020 CLINICAL DATA:  Shortness of breath, O2 requirement,  hypertension and diabetes EXAM: CHEST - 2 VIEW COMPARISON:  09/27/2019 FINDINGS: Stable cardiomegaly and previous median sternotomy for coronary bypass. Similar central vascular congestion. Increased bibasilar ill-defined bronchovascular opacities over the hemidiaphragms compatible with atelectasis or bibasilar bronchopneumonia. No superimposed edema pattern, significant effusion or pneumothorax. Trachea midline. Aorta atherosclerotic. Degenerative changes of the spine. Remote right rib fractures. IMPRESSION: Increased bibasilar atelectasis versus developing pneumonia. Stable cardiomegaly and postoperative findings. Aortic Atherosclerosis (ICD10-I70.0). Electronically Signed   By: Jerilynn Mages.  Shick M.D.   On: 05/01/2020 11:16   CT Head Wo Contrast  Result Date: 05/14/2020 CLINICAL DATA:  Headaches EXAM: CT HEAD WITHOUT CONTRAST TECHNIQUE: Contiguous axial images were obtained from the base of the skull through the vertex without intravenous contrast. COMPARISON:  None. FINDINGS: Brain: Mild atrophic changes and white matter ischemic change are seen. No findings to suggest acute hemorrhage, acute infarction or space-occupying mass lesion are noted. Vascular: No hyperdense vessel or unexpected calcification. Skull: Normal. Negative for fracture or focal lesion. Sinuses/Orbits: No acute finding. Other: None. IMPRESSION: Chronic atrophic and ischemic changes without acute abnormality. Electronically Signed   By: Inez Catalina M.D.   On: 05/14/2020 16:02   CT Angio Chest PE W and/or Wo Contrast  Result Date: 05/14/2020 CLINICAL DATA:  79 year old male with concern for pulmonary embolism. EXAM: CT ANGIOGRAPHY CHEST WITH CONTRAST TECHNIQUE: Multidetector CT imaging of the chest was performed using the standard protocol during bolus administration of intravenous contrast. Multiplanar CT image reconstructions and MIPs were obtained to evaluate the vascular anatomy. CONTRAST:  44mL OMNIPAQUE IOHEXOL 350 MG/ML SOLN  COMPARISON:  Chest CT dated 07/14/2019. FINDINGS: Cardiovascular: There is no cardiomegaly or pericardial effusion. There is coronary vascular calcification. Moderate atherosclerotic calcification of the thoracic aorta. No aneurysmal dilatation. No pulmonary artery embolus identified. Mediastinum/Nodes: There is no hilar or mediastinal adenopathy. The esophagus is grossly unremarkable. No mediastinal fluid collection. Lungs/Pleura: Small bilateral pleural effusions with associated partial compressive atelectasis the lower lobes. There is diffuse interstitial and interlobular septal prominence and diffuse ground-glass opacity throughout the lungs which most consistent with edema. Pneumonia is not excluded clinical correlation is recommended. There is no pneumothorax. The central airways are patent. Upper Abdomen: No acute abnormality. Musculoskeletal: Degenerative changes of the spine. No acute osseous pathology. Old right posterior rib fractures. Median sternotomy wires. Review of the MIP images confirms the above findings. IMPRESSION: 1. No CT evidence of pulmonary artery embolus. 2. Small bilateral pleural effusions with associated partial compressive atelectasis of the lower lobes. 3. Diffuse interstitial and interlobular septal prominence and diffuse ground-glass opacity throughout the lungs most consistent with edema. Pneumonia is not excluded clinical correlation is recommended. 4. Aortic Atherosclerosis (ICD10-I70.0). Electronically Signed   By: Anner Crete M.D.   On: 05/14/2020 16:16   DG Chest Port 1 View  Result Date: 05/14/2020 CLINICAL DATA:  Worst headache of life, worsened headache with coughing, onset of headache after taking blood pressure medication this morning EXAM: PORTABLE CHEST 1 VIEW COMPARISON:  Portable exam 1409 hours compared to 05/01/2020 FINDINGS: Normal heart size post median sternotomy. Mediastinal contours and pulmonary vascularity normal. Chronic accentuation of basilar  markings unchanged. No definite acute infiltrate, pleural effusion or pneumothorax. Old RIGHT rib fractures. IMPRESSION: No acute abnormalities. Electronically  Signed   By: Lavonia Dana M.D.   On: 05/14/2020 14:29   ECHOCARDIOGRAM COMPLETE  Result Date: 05/01/2020    ECHOCARDIOGRAM REPORT   Patient Name:   Markee A Mallis Date of Exam: 05/01/2020 Medical Rec #:  [id number]    Height:       72.5 in Accession #:    [id number]   Weight:       223.1 lb Date of Birth:  10/19/1940   BSA:          2.243 m Patient Age:    16 years     BP:           173/66 mmHg Patient Gender: M            HR:           63 bpm. Exam Location:  [facility] Procedure: 2D Echo and Intracardiac Opacification Agent Indications:     Dyspnea R06.00  History:         Patient has prior history of Echocardiogram examinations, most                  recent 08/02/2016.  Sonographer:     Arville Go RDCS Referring Phys:  [place] Diagnosing Phys: Isaias Cowman MD  Sonographer Comments: Technically difficult study due to poor echo windows. Image acquisition challenging due to patient body habitus. IMPRESSIONS  1. Left ventricular ejection fraction, by estimation, is 55 to 60%. The left ventricle has normal function. The left ventricle has no regional wall motion abnormalities. Left ventricular diastolic parameters were normal.  2. Right ventricular systolic function is normal. The right ventricular size is normal.  3. The mitral valve is normal in structure. Mild mitral valve regurgitation. No evidence of mitral stenosis.  4. The aortic valve is normal in structure. Aortic valve regurgitation is not visualized. No aortic stenosis is present.  5. The inferior vena cava is normal in size with greater than 50% respiratory variability, suggesting right atrial pressure of 3 mmHg. FINDINGS  Left Ventricle: Left ventricular ejection fraction, by estimation, is 55 to 60%. The left ventricle has normal function. The left ventricle has no regional  wall motion abnormalities. Definity contrast agent was given IV to delineate the left ventricular  endocardial borders. The left ventricular internal cavity size was normal in size. There is no left ventricular hypertrophy. Left ventricular diastolic parameters were normal. Right Ventricle: The right ventricular size is normal. No increase in right ventricular wall thickness. Right ventricular systolic function is normal. Left Atrium: Left atrial size was normal in size. Right Atrium: Right atrial size was normal in size. Pericardium: There is no evidence of pericardial effusion. Mitral Valve: The mitral valve is normal in structure. Mild mitral valve regurgitation. No evidence of mitral valve stenosis. Tricuspid Valve: The tricuspid valve is normal in structure. Tricuspid valve regurgitation is mild . No evidence of tricuspid stenosis. Aortic Valve: The aortic valve is normal in structure. Aortic valve regurgitation is not visualized. No aortic stenosis is present. Aortic valve peak gradient measures 6.0 mmHg. Pulmonic Valve: The pulmonic valve was normal in structure. Pulmonic valve regurgitation is not visualized. No evidence of pulmonic stenosis. Aorta: The aortic root is normal in size and structure. Venous: The inferior vena cava is normal in size with greater than 50% respiratory variability, suggesting right atrial pressure of 3 mmHg. IAS/Shunts: No atrial level shunt detected by color flow Doppler.  LEFT VENTRICLE PLAX 2D LVIDd:  4.39 cm  Diastology LVIDs:         3.10 cm  LV e' medial:    7.72 cm/s LV PW:         1.25 cm  LV E/e' medial:  17.2 LV IVS:        1.48 cm  LV e' lateral:   7.07 cm/s LVOT diam:     2.10 cm  LV E/e' lateral: 18.8 LV SV:         79 LV SV Index:   35 LVOT Area:     3.46 cm  RIGHT VENTRICLE RV Basal diam:  3.33 cm RV S prime:     9.03 cm/s TAPSE (M-mode): 1.6 cm LEFT ATRIUM             Index       RIGHT ATRIUM           Index LA diam:        4.70 cm 2.10 cm/m  RA Area:      14.50 cm LA Vol (A2C):   54.3 ml 24.20 ml/m RA Volume:   34.30 ml  15.29 ml/m LA Vol (A4C):   43.9 ml 19.57 ml/m LA Biplane Vol: 53.2 ml 23.71 ml/m  AORTIC VALVE                PULMONIC VALVE AV Area (Vmax): 2.63 cm    PV Vmax:       0.86 m/s AV Vmax:        122.00 cm/s PV Peak grad:  2.9 mmHg AV Peak Grad:   6.0 mmHg LVOT Vmax:      92.50 cm/s LVOT Vmean:     59.500 cm/s LVOT VTI:       0.229 m  AORTA Ao Root diam: 3.80 cm Ao Asc diam:  3.60 cm MITRAL VALVE                TRICUSPID VALVE MV Area (PHT): 4.54 cm     TV Peak grad:   54.7 mmHg MV Decel Time: 167 msec     TV Vmax:        3.70 m/s MV E velocity: 133.00 cm/s MV A velocity: 52.00 cm/s   SHUNTS MV E/A ratio:  2.56         Systemic VTI:  0.23 m                             Systemic Diam: 2.10 cm Isaias Cowman MD Electronically signed by Isaias Cowman MD Signature Date/Time: 05/01/2020/5:03:26 PM    Final     EKG: Atrial flutter with controlled ventricular rate and 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 rate 16, height 6' (1.829 m), weight 103.9 kg, SpO2 94 %. Body mass index is 31.06 kg/m. General: Well developed, well nourished, in no acute distress. Head eyes ears nose throat: Normocephalic, atraumatic, sclera non-icteric, no xanthomas, nares are without discharge. No apparent thyromegaly and/or mass  Lungs: Normal respiratory effort.  Diffuse wheezes, basilar rales, no rhonchi.  Heart: RRR with normal S1 S2. no murmur gallop, no rub, PMI is normal size and placement, carotid upstroke normal without bruit, jugular venous pressure is normal Abdomen: Soft,  non-tender,  distended with normoactive bowel sounds. No hepatomegaly. No rebound/guarding. No obvious abdominal masses.  Abdominal aorta is normal size without bruit Extremities: Trace to 1+ edema. no cyanosis, no clubbing, no ulcers  Peripheral : 2+ bilateral upper extremity pulses, 1+ bilateral femoral pulses, 0+ bilateral dorsal pedal pulse Neuro: Alert and oriented. No facial asymmetry. No focal deficit. Moves all extremities spontaneously. Musculoskeletal: Normal muscle tone without kyphosis Psych:  Responds to questions appropriately with a normal affect.    Assessment: 79 year old male with known coronary disease status post coronary bypass graft with unstable angina versus acute coronary syndrome chronic kidney disease stage III with acute on chronic congestive heart failure sleep apnea diabetes hypertension hyperlipidemia and controlled atrial flutter needing further medication management and treatment options  Plan: 1.  Continue metoprolol for heart rate control of atrial flutter without change today 2.  Hypertension control with metoprolol lisinopril amlodipine combination 3.  Furosemide intravenously for acute on chronic systolic dysfunction congestive heart failure 4.  Proceed to cardiac catheterization to assess coronary anatomy and graft anatomy and cause of congestive heart failure as well as what appears to be myocardial injury and/or non-ST elevation myocardial infarction.  Patient understands risk and benefits of cardiac catheterization.  This includes a possibility of death stroke heart attack infection bleeding or blood clot.  He is at low risk for conscious sedation  Signed, Corey Skains M.D. [facility] 05/16/2020, 8:28 AM

## 2020-05-16 NOTE — Progress Notes (Signed)
1        [place] [facility] [place] NAME: Ernest Mclean    MR#:  [id number]  DATE OF BIRTH:  February 10, 1941  SUBJECTIVE:  CHIEF COMPLAINT:   Chief Complaint  Patient presents with   Headache  Having headache.  Cardiology was planning on cardiac cath but holding off for now per family/spouse request. REVIEW OF SYSTEMS:  Review of Systems  Constitutional: Negative for diaphoresis, fever, malaise/fatigue and weight loss.  HENT: Negative for ear discharge, ear pain, hearing loss, nosebleeds, sore throat and tinnitus.   Eyes: Negative for blurred vision and pain.  Respiratory: Positive for shortness of breath. Negative for cough, hemoptysis and wheezing.   Cardiovascular: Negative for chest pain, palpitations, orthopnea and leg swelling.  Gastrointestinal: Negative for abdominal pain, blood in stool, constipation, diarrhea, heartburn, nausea and vomiting.  Genitourinary: Negative for dysuria, frequency and urgency.  Musculoskeletal: Negative for back pain and myalgias.  Skin: Negative for itching and rash.  Neurological: Positive for headaches. Negative for dizziness, tingling, tremors, focal weakness, seizures and weakness.  Psychiatric/Behavioral: Negative for depression. The patient is not nervous/anxious.    DRUG ALLERGIES:   Allergies  Allergen Reactions   Codeine Hives, Rash and Swelling   Hydralazine Other (See Comments)    tongue swollen and couldn't wake up ---- not positive it was this or a mix of this with something else or high sugar   Hydrocodone-Acetaminophen Other (See Comments)   Penicillins Other (See Comments)    Passed out (at 79 yrs old) Has patient had a PCN reaction causing immediate rash, facial/tongue/throat swelling, SOB or lightheadedness with hypotension: No Has patient had a PCN reaction causing severe rash involving mucus membranes or skin necrosis: No Has patient had a PCN reaction that required hospitalization No Has patient had a PCN  reaction occurring within the last 10 years: No If all of the above answers are "NO", then may proceed with Cephalosporin use.    VITALS:  Blood pressure (!) 155/62, pulse 70, temperature 98.9 F (37.2 C), temperature source Oral, resp. rate 16, height 6' (1.829 m), weight 103.9 kg, SpO2 94 %. PHYSICAL EXAMINATION:  Physical Exam Constitutional:      Appearance: He is obese.  HENT:     Head: Normocephalic and atraumatic.  Eyes:     Conjunctiva/sclera: Conjunctivae normal.     Pupils: Pupils are equal, round, and reactive to light.  Neck:     Thyroid: No thyromegaly.     Trachea: No tracheal deviation.  Cardiovascular:     Rate and Rhythm: Normal rate and regular rhythm.     Heart sounds: Normal heart sounds.  Pulmonary:     Effort: Pulmonary effort is normal. No respiratory distress.     Breath sounds: Normal breath sounds. No wheezing.  Chest:     Chest wall: No tenderness.  Abdominal:     General: Bowel sounds are normal. There is no distension.     Palpations: Abdomen is soft.     Tenderness: There is no abdominal tenderness.  Musculoskeletal:        General: Normal range of motion.     Cervical back: Normal range of motion and neck supple.  Skin:    General: Skin is warm and dry.     Findings: No rash.  Neurological:     Mental Status: He is alert and oriented to person, place, and time.     Cranial Nerves: No cranial nerve deficit.    LABORATORY  PANEL:  Male CBC Recent Labs  Lab 05/16/20 0233  WBC 15.5*  HGB 10.2*  HCT 32.2*  PLT 154   ------------------------------------------------------------------------------------------------------------------ Chemistries  Recent Labs  Lab 05/14/20 1358 05/15/20 0455 05/16/20 0233  NA 135   < > 136  K 4.0   < > 3.5  CL 94*   < > 96*  CO2 31   < > 34*  GLUCOSE 420*   < > 173*  BUN 37*   < > 35*  CREATININE 1.83*   < > 1.62*  CALCIUM 8.3*   < > 8.0*  AST 29  --   --   ALT 35  --   --   ALKPHOS 109  --   --    BILITOT 1.1  --   --    < > = values in this interval not displayed.   RADIOLOGY:  No results found. ASSESSMENT AND PLAN:  79 year old male with a known history of coronary disease, diastolic CHF, A. fib, CKD, OSA, type 2 diabetes, COPD on 2 L oxygen chronically was admitted for headache and some chest tightness/shortness of breath  Acute on chronic hypoxic respiratory failure Acute on chronic diastolic heart failure He required 4 L oxygen via nasal cannula in the ED and normally he is on 2 L at home Wean oxygen as able This is likely multifactorial from underlying COPD, heart failure exacerbation, OSA, pulmonary edema/fluid overload He seemed to have responded well to diuresis and feeling quite back to baseline  Elevated troponin 186->190->785->616 -Cannot rule out non-STEMI at this time.  This certainly also could be demand ischemia. -Cardiology Dr. Nehemiah Massed was originally planning on doing cardiac catheterization today but spouse objected.  We will hold off cardiac cath today and request evaluation by Dr. Ubaldo Glassing tomorrow. -Heparin drip for now.  Headache Intermittent.  Symptomatic management for now  Hypertension: Continue amlodipine, lisinopril A flutter/A. fib: Continue metoprolol, held Eliquis to transition to heparin drip per cardiology.  Rate controlled CAD: Continue aspirin, atorvastatin Depression/anxiety: Continue citalopram, diazepam COPD: Continue fluticasone-salmeterol, Spiriva, albuterol DM: Hold metformin, reduce home Lantus to 25 units and home short acting insulin to 10 units 3 times daily along with sliding scale GERD: Continue PPI Neuropathy: Continue pregabalin Obesity Body mass index is 31.06 kg/m.      Status is: Inpatient  Remains inpatient appropriate because:Inpatient level of care appropriate due to severity of illness   Dispo: The patient is from: Home              Anticipated d/c is to: Home              Anticipated d/c date is: 2 days               Patient currently is not medically stable to d/c.  Await cardiac evaluation and or different cardiology input based on wife's request     DVT prophylaxis:        Heparin full dose    Family Communication: ( "discussed with patient")   All the records are reviewed and case discussed with Care Management/Social Worker. Management plans discussed with the patient, nursing, wife and they are in agreement.  CODE STATUS: DNR  TOTAL TIME TAKING CARE OF THIS PATIENT: 35 minutes.   More than 50% of the time was spent in counseling/coordination of care: YES  POSSIBLE D/C IN 2-3 DAYS, DEPENDING ON CLINICAL CONDITION.  And cardiology evaluation   Max Sane M.D on 05/16/2020 at 3:22 PM  [facility]   CC: Primary care physician; Dorothey Baseman, MD  Note: This dictation was prepared with Dragon dictation along with smaller phrase technology. Any transcriptional errors that result from this process are unintentional.

## 2020-05-16 NOTE — Plan of Care (Signed)
Continuing with plan of care. 

## 2020-05-16 NOTE — TOC Initial Note (Addendum)
Transition of Care [facility]) - Initial/Assessment Note    Patient Details  Name: Ernest Mclean MRN: [id number] Date of Birth: 18-Dec-1940  [facility] CM/SW Contact:    Meriel Flavors, LCSW Phone Number: 05/16/2020, 10:19 AM  Clinical Narrative:                 Patient just here on 05/03/2020. Patient lives in single family home with spouse. Patient has transportation, no issues with getting medications, has a PCP,Bronstein, David, MD. Patient plans to return home at discharge. Patient uses oxygen at home provided by [employer].   10:30  Patient recommended for cardiac cath to further assess       Patient Goals and CMS Choice  Get better and go home.      Expected Discharge Plan and [place] with self and spouse.                                              Prior Living Arrangements/Services                       Activities of Daily Living Home Assistive Devices/Equipment: None ADL Screening (condition at time of admission) Patient's cognitive ability adequate to safely complete daily activities?: Yes Is the patient deaf or have difficulty hearing?: No Does the patient have difficulty seeing, even when wearing glasses/contacts?: No Does the patient have difficulty concentrating, remembering, or making decisions?: No Patient able to express need for assistance with ADLs?: Yes Does the patient have difficulty dressing or bathing?: No Independently performs ADLs?: Yes (appropriate for developmental age) Does the patient have difficulty walking or climbing stairs?: Yes Weakness of Legs: Both Weakness of Arms/Hands: None  Permission Sought/Granted                  Emotional Assessment              Admission diagnosis:  Cough [R05.9] Bad headache [R51.9] HCAP (healthcare-associated pneumonia) [J18.9] Sepsis ([place]) [A41.9] Patient Active Problem List   Diagnosis Date Noted  . HCAP (healthcare-associated pneumonia) 05/14/2020   . CHF exacerbation ([place]) 05/01/2020  . Atrial fibrillation, chronic ([place]) 09/27/2019  . Community acquired pneumonia 09/27/2019  . Acute on chronic respiratory failure with hypoxia ([place]) 09/27/2019  . Elevated troponin 09/27/2019  . Acute metabolic encephalopathy 44/07/4740  . Pneumonia 01/03/2019  . 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 Atherosclerosis of native arteries of extremity with intermittent claudication ([place]) 05/08/2017  . Atherosclerosis of native arteries of the extremities with ulceration ([place]) 03/20/2017  . CKD (chronic kidney disease), stage IIIa 03/20/2017  . Acute on chronic diastolic CHF (congestive heart failure) ([place]) 08/01/2016  . Anxiety 02/22/2016  . Chronic respiratory failure ([place]) 08/10/2015  . Hypoglycemia 05/12/2015  . Type II diabetes mellitus with renal manifestations ([place]) 05/12/2015  . HTN (hypertension) 05/12/2015  . CAD (coronary artery disease) 05/12/2015  . Atrial flutter ([place]) 05/12/2015  . GERD (gastroesophageal reflux disease) 05/12/2015  . Solitary pulmonary nodule 02/10/2015  . OSA on CPAP 08/17/2014  . COPD with acute exacerbation ([place]) 08/17/2014   PCP:  Juluis Pitch, MD Pharmacy:   [facility] [place], [place] [address] [place] [address] Phone: [phone] Fax: [phone]  Social Determinants of Health (SDOH) Interventions    Readmission Risk Interventions Readmission Risk Prevention Plan 05/16/2020  Transportation Screening Complete  PCP or Specialist Appt within 3-5 Days Complete  HRI or [place] Complete  Social Work Consult for [place] Planning/Counseling Complete  Palliative Care Screening Not Applicable  Medication Review Press photographer) Complete  Some recent data might be hidden

## 2020-05-16 NOTE — Progress Notes (Signed)
[place] for Heparin drip Indication: chest pain/ACS  Allergies  Allergen Reactions  . Codeine Hives, Rash and Swelling  . Hydralazine Other (See Comments)    tongue swollen and couldn't wake up ---- not positive it was this or a mix of this with something else or high sugar  . Hydrocodone-Acetaminophen Other (See Comments)  . Penicillins Other (See Comments)    Passed out (at 79 yrs old) Has patient had a PCN reaction causing immediate rash, facial/tongue/throat swelling, SOB or lightheadedness with hypotension: No Has patient had a PCN reaction causing severe rash involving mucus membranes or skin necrosis: No Has patient had a PCN reaction that required hospitalization No Has patient had a PCN reaction occurring within the last 10 years: No If all of the above answers are "NO", then may proceed with Cephalosporin use.     Patient Measurements: Height: 6' (182.9 cm) Weight: 103.9 kg (229 lb) IBW/kg (Calculated) : 77.6 Heparin Dosing Weight: 99.1 kg  Vital Signs: Temp: 98.5 F (36.9 C) (12/26 0009) Temp Source: Oral (12/26 0009) BP: 159/55 (12/26 0009) Pulse Rate: 56 (12/26 0009)  Labs: Recent Labs    05/14/20 1358 05/15/20 0455 05/15/20 0855 05/15/20 1021 05/15/20 1548 05/16/20 0233  HGB 11.1*  --   --  10.1*  --  10.2*  HCT 35.3*  --   --  32.3*  --  32.2*  PLT 167  --   --  153  --  154  APTT  --   --   --  28 42* 50*  LABPROT 14.6  --   --   --   --   --   INR 1.2  --   --   --   --   --   HEPARINUNFRC  --   --   --  1.23*  --  0.90*  CREATININE 1.83* 1.91*  --   --   --  1.62*  TROPONINIHS  --   --  785* 616*  --   --     Estimated Creatinine Clearance: 46.8 mL/min (A) (by C-G formula based on SCr of 1.62 mg/dL (H)).   Medical History: Past Medical History:  Diagnosis Date  . Atrial fibrillation ([place])   . Basal cell carcinoma 06/2017   Left Ear  . CAD (coronary artery disease)   . CKD (chronic kidney disease)    . Congestive heart failure ([place])   . COPD (chronic obstructive pulmonary disease) ([place])   . Diabetes ([place])   . Hypertension   . OSA on CPAP   . Squamous carcinoma 06/2017   head and nose    Medications:  Scheduled:  . albuterol  1 puff Inhalation Q4H  . amLODipine  5 mg Oral Daily  . vitamin C  500 mg Oral Daily  . aspirin EC  81 mg Oral Daily  . atorvastatin  20 mg Oral Daily  . citalopram  20 mg Oral QHS  . fluticasone furoate-vilanterol  1 puff Inhalation Daily  . furosemide  20 mg Intravenous BID  . insulin aspart  0-15 Units Subcutaneous TID WC  . insulin aspart  10 Units Subcutaneous TID WC  . insulin glargine  25 Units Subcutaneous QHS  . lisinopril  10 mg Oral BID  . metoprolol tartrate  50 mg Oral BID  . pantoprazole  40 mg Oral Daily  . pregabalin  25 mg Oral BID  . sodium chloride flush  3 mL Intravenous Q12H  .  tiotropium  18 mcg Inhalation Daily  . vitamin B-12  500 mcg Oral Daily  . [START ON 05/18/2020] Vitamin D (Ergocalciferol)  50,000 Units Oral Q30 days   Infusions:  . sodium chloride    . heparin 1,300 Units/hr (05/15/20 1837)    Assessment: 79 y.o. male presenting with SOB and found to have c/f ACS/STEMI. Patient was initially continued on home dose of apixaban (2.5mg  bid for afib, last dose 12/25 0855). Pharmacy has now been consulted for IV heparin dosing. Baseline heparin level elevated as expected at 1.23 due to time of last apixaban dose. Baseline aPTT 28.  Will dose heparin infusion based on aPTT until levels correlate.  Initial aPTT ordered approximately 6 hours after start of infusion. aPTT is below goal range, as can be expected since heparin likely still accumulating. Even so, will conservatively increase heparin infusion rate as aPTT should be closer to goal range 6 hours into the infusion. Hgb low normal and plt WNL. No issues with the infusion or overt bleeding noted.   1226 0233 aPTT 50 SUBtherapeutic, (HL 0.90, does not correlate)  CBC  stable  Goal of Therapy:  Heparin level 0.3-0.7 units/ml aPTT 66-102 Monitor platelets by anticoagulation protocol: Yes  Plan:  Increase heparin infusion to 1500 units/hr Check 8 hour aPTT Monitor CBC, daily heparin level and aPTT until levels correlate Continue to monitor for signs/symptoms of bleeding F/u transition back to oral anticoagulant   Valrie Hart, PharmD Clinical Pharmacist   05/16/2020   3:00 AM

## 2020-05-16 NOTE — Progress Notes (Signed)
ANTICOAGULATION CONSULT NOTE   Pharmacy Consult for Heparin drip Indication: chest pain/ACS  Patient Measurements: Heparin Dosing Weight: 99.1 kg  Labs: Recent Labs    05/14/20 1358 05/14/20 1358 05/15/20 0455 05/15/20 0855 05/15/20 1021 05/15/20 1548 05/16/20 0233 05/16/20 1519  HGB 11.1*  --   --   --  10.1*  --  10.2*  --   HCT 35.3*  --   --   --  32.3*  --  32.2*  --   PLT 167  --   --   --  153  --  154  --   APTT  --    < >  --   --  28 42* 50* 61*  LABPROT 14.6  --   --   --   --   --   --   --   INR 1.2  --   --   --   --   --   --   --   HEPARINUNFRC  --   --   --   --  1.23*  --  0.90* 0.63  CREATININE 1.83*  --  1.91*  --   --   --  1.62*  --   TROPONINIHS  --   --   --  785* 616*  --   --   --    < > = values in this interval not displayed.    Estimated Creatinine Clearance: 46.8 mL/min (A) (by C-G formula based on SCr of 1.62 mg/dL (H)).   Medical History: Past Medical History:  Diagnosis Date  . Atrial fibrillation ([place])   . Basal cell carcinoma 06/2017   Left Ear  . CAD (coronary artery disease)   . CKD (chronic kidney disease)   . Congestive heart failure ([place])   . COPD (chronic obstructive pulmonary disease) ([place])   . Diabetes ([place])   . Hypertension   . OSA on CPAP   . Squamous carcinoma 06/2017   head and nose   Infusions:  . sodium chloride    . heparin 1,500 Units/hr (05/16/20 1229)    Assessment: 79 y.o. male presenting with SOB and found to have c/f ACS/STEMI. Patient was initially continued on home dose of apixaban (2.5mg  bid for afib, last dose 12/25 0855). Pharmacy has now been consulted for IV heparin dosing. Baseline heparin level elevated as expected at 1.23 due to time of last apixaban dose. Baseline aPTT 28.  Will dose heparin infusion based on aPTT until levels correlate.  Goal of Therapy:  Heparin level 0.3-0.7 units/ml aPTT 66-102 Monitor platelets by anticoagulation protocol: Yes  Plan:  --12/26 at 1519 aPTT = 61  seconds, slightly subtherapeutic. Heparin level = 0.63. Levels still not correlating. Will increase heparin infusion to 1700 units/hr --Will re-check aPTT and HL 8 hours after rate change --Daily CBC per protocol while on heparin infusion --Cardiac catheterization deferred today. Continue to monitor plan per cardiology --Continue to hold home apixaban while on heparin infusion  Benita Gutter   05/16/2020   3:50 PM

## 2020-05-17 ENCOUNTER — Encounter
Admission: EM | Disposition: A | Discharge status: Home Health | Payer: Self-pay | Source: Home / Self Care | Attending: Internal Medicine

## 2020-05-17 DIAGNOSIS — I2510 Atherosclerotic heart disease of native coronary artery without angina pectoris: Secondary | ICD-10-CM | POA: Diagnosis not present

## 2020-05-17 DIAGNOSIS — I214 Non-ST elevation (NSTEMI) myocardial infarction: Secondary | ICD-10-CM | POA: Diagnosis not present

## 2020-05-17 DIAGNOSIS — I5033 Acute on chronic diastolic (congestive) heart failure: Secondary | ICD-10-CM | POA: Diagnosis not present

## 2020-05-17 DIAGNOSIS — I482 Chronic atrial fibrillation, unspecified: Secondary | ICD-10-CM | POA: Diagnosis not present

## 2020-05-17 DIAGNOSIS — J9621 Acute and chronic respiratory failure with hypoxia: Secondary | ICD-10-CM | POA: Diagnosis not present

## 2020-05-17 LAB — CBC
HCT: 31 % — ABNORMAL LOW (ref 39.0–52.0)
HCT: 31.6 % — ABNORMAL LOW (ref 39.0–52.0)
Hemoglobin: 10.3 g/dL — ABNORMAL LOW (ref 13.0–17.0)
Hemoglobin: 9.8 g/dL — ABNORMAL LOW (ref 13.0–17.0)
MCH: 27.6 pg (ref 26.0–34.0)
MCH: 28.2 pg (ref 26.0–34.0)
MCHC: 31.6 g/dL (ref 30.0–36.0)
MCHC: 32.6 g/dL (ref 30.0–36.0)
MCV: 86.6 fL (ref 80.0–100.0)
MCV: 87.3 fL (ref 80.0–100.0)
Platelets: 147 10*3/uL — ABNORMAL LOW (ref 150–400)
Platelets: 151 10*3/uL (ref 150–400)
RBC: 3.55 MIL/uL — ABNORMAL LOW (ref 4.22–5.81)
RBC: 3.65 MIL/uL — ABNORMAL LOW (ref 4.22–5.81)
RDW: 16.8 % — ABNORMAL HIGH (ref 11.5–15.5)
RDW: 16.9 % — ABNORMAL HIGH (ref 11.5–15.5)
WBC: 11.8 10*3/uL — ABNORMAL HIGH (ref 4.0–10.5)
WBC: 12.8 10*3/uL — ABNORMAL HIGH (ref 4.0–10.5)
nRBC: 0 % (ref 0.0–0.2)
nRBC: 0 % (ref 0.0–0.2)

## 2020-05-17 LAB — BASIC METABOLIC PANEL
Anion gap: 8 (ref 5–15)
BUN: 36 mg/dL — ABNORMAL HIGH (ref 8–23)
CO2: 32 mmol/L (ref 22–32)
Calcium: 7.7 mg/dL — ABNORMAL LOW (ref 8.9–10.3)
Chloride: 95 mmol/L — ABNORMAL LOW (ref 98–111)
Creatinine, Ser: 1.78 mg/dL — ABNORMAL HIGH (ref 0.61–1.24)
GFR, Estimated: 39 mL/min — ABNORMAL LOW (ref >60)
Glucose, Bld: 397 mg/dL — ABNORMAL HIGH (ref 70–99)
Potassium: 3.5 mmol/L (ref 3.5–5.1)
Sodium: 135 mmol/L (ref 135–145)

## 2020-05-17 LAB — APTT
aPTT: 118 seconds — ABNORMAL HIGH (ref 24–36)
aPTT: 83 seconds — ABNORMAL HIGH (ref 24–36)

## 2020-05-17 LAB — BRAIN NATRIURETIC PEPTIDE: B Natriuretic Peptide: 482.3 pg/mL — ABNORMAL HIGH (ref 0.0–100.0)

## 2020-05-17 LAB — GLUCOSE, CAPILLARY
Glucose-Capillary: 258 mg/dL — ABNORMAL HIGH (ref 70–99)
Glucose-Capillary: 358 mg/dL — ABNORMAL HIGH (ref 70–99)
Glucose-Capillary: 116 mg/dL — ABNORMAL HIGH (ref 70–99)
Glucose-Capillary: 319 mg/dL — ABNORMAL HIGH (ref 70–99)

## 2020-05-17 LAB — HEPARIN LEVEL (UNFRACTIONATED): Heparin Unfractionated: 0.74 IU/mL — ABNORMAL HIGH (ref 0.30–0.70)

## 2020-05-17 SURGERY — LEFT HEART CATH AND CORS/GRAFTS ANGIOGRAPHY
Anesthesia: Moderate Sedation

## 2020-05-17 MED ORDER — POLYETHYLENE GLYCOL 3350 17 G PO PACK
17.0000 g | PACK | Freq: Every day | ORAL | Status: DC
  Administered 2020-05-17 – 2020-05-19 (×3): 17 g via ORAL
  Filled 2020-05-17 (×3): qty 1

## 2020-05-17 MED ORDER — SENNOSIDES-DOCUSATE SODIUM 8.6-50 MG PO TABS
2.0000 | ORAL_TABLET | Freq: Two times a day (BID) | ORAL | Status: DC
  Administered 2020-05-17 – 2020-05-20 (×7): 2 via ORAL
  Filled 2020-05-17 (×7): qty 2

## 2020-05-17 MED ORDER — INSULIN GLARGINE 100 UNIT/ML ~~LOC~~ SOLN
35.0000 [IU] | Freq: Every day | SUBCUTANEOUS | Status: DC
  Administered 2020-05-17: 21:00:00 35 [IU] via SUBCUTANEOUS
  Filled 2020-05-17 (×2): qty 0.35

## 2020-05-17 MED ORDER — FUROSEMIDE 10 MG/ML IJ SOLN
40.0000 mg | Freq: Two times a day (BID) | INTRAMUSCULAR | Status: DC
  Administered 2020-05-17 – 2020-05-19 (×5): 40 mg via INTRAVENOUS
  Filled 2020-05-17 (×6): qty 4

## 2020-05-17 MED ORDER — METHYLPREDNISOLONE SODIUM SUCC 125 MG IJ SOLR
60.0000 mg | INTRAMUSCULAR | Status: DC
  Administered 2020-05-17 – 2020-05-18 (×2): 60 mg via INTRAVENOUS
  Filled 2020-05-17 (×2): qty 2

## 2020-05-17 MED ORDER — APIXABAN 2.5 MG PO TABS
2.5000 mg | ORAL_TABLET | Freq: Two times a day (BID) | ORAL | Status: DC
  Administered 2020-05-17 – 2020-05-20 (×7): 2.5 mg via ORAL
  Filled 2020-05-17 (×7): qty 1

## 2020-05-17 MED ORDER — CLOPIDOGREL BISULFATE 75 MG PO TABS
75.0000 mg | ORAL_TABLET | Freq: Every day | ORAL | Status: DC
  Administered 2020-05-17 – 2020-05-20 (×4): 75 mg via ORAL
  Filled 2020-05-17 (×4): qty 1

## 2020-05-17 MED ORDER — INSULIN ASPART 100 UNIT/ML ~~LOC~~ SOLN
15.0000 [IU] | Freq: Three times a day (TID) | SUBCUTANEOUS | Status: DC
  Administered 2020-05-17 – 2020-05-18 (×2): 15 [IU] via SUBCUTANEOUS
  Filled 2020-05-17 (×3): qty 1

## 2020-05-17 NOTE — Progress Notes (Signed)
ANTICOAGULATION CONSULT NOTE   Pharmacy Consult for Heparin drip Indication: chest pain/ACS  Patient Measurements: Heparin Dosing Weight: 99.1 kg  Labs: Recent Labs    05/14/20 1358 05/14/20 1358 05/15/20 0455 05/15/20 0855 05/15/20 1021 05/15/20 1548 05/16/20 0233 05/16/20 1519 05/17/20 0009 05/17/20 0751  HGB 11.1*  --   --   --  10.1*  --  10.2*  --  9.8*  --   HCT 35.3*  --   --   --  32.3*  --  32.2*  --  31.0*  --   PLT 167  --   --   --  153  --  154  --  147*  --   APTT  --   --   --   --  28   < > 50* 61* 83* 118*  LABPROT 14.6  --   --   --   --   --   --   --   --   --   INR 1.2  --   --   --   --   --   --   --   --   --   HEPARINUNFRC  --    < >  --   --  1.23*  --  0.90* 0.63 0.74*  --   CREATININE 1.83*  --  1.91*  --   --   --  1.62*  --  1.78*  --   TROPONINIHS  --   --   --  785* 616*  --   --   --   --   --    < > = values in this interval not displayed.    Estimated Creatinine Clearance: 42.6 mL/min (A) (by C-G formula based on SCr of 1.78 mg/dL (H)).  Medical History: Past Medical History:  Diagnosis Date  . Atrial fibrillation ([place])   . Basal cell carcinoma 06/2017   Left Ear  . CAD (coronary artery disease)   . CKD (chronic kidney disease)   . Congestive heart failure ([place])   . COPD (chronic obstructive pulmonary disease) ([place])   . Diabetes ([place])   . Hypertension   . OSA on CPAP   . Squamous carcinoma 06/2017   head and nose   Infusions:  . sodium chloride    . heparin 1,700 Units/hr (05/17/20 0244)    Assessment: 79 y.o. male presenting with SOB and found to have c/f ACS/STEMI. Patient was initially continued on home dose of apixaban (2.5mg  bid for afib, last dose 12/25 0855). Pharmacy has now been consulted for IV heparin dosing. Baseline heparin level elevated as expected at 1.23 due to time of last apixaban dose. Baseline aPTT 28.  Will dose heparin infusion based on aPTT until levels correlate.  12/26 0233 APTT=50.  HL= 0.90  Not  correlating. Increase drip to 1500 units/hr 12/26 1519 aPTT=61. HL=0.63  Not correlating. Increase drip 10 1700 units/hr 12/27 0009 aPTT= 83.  HL=0.74. not correlating. Cont heparin 1700 units/hr 12/27 at 0751 aPTT = 118 seconds, supratherapeutic  Will decrease heparin infusion to 1600 units/hr  Goal of Therapy:  Heparin level 0.3-0.7 units/ml aPTT 66-102 Monitor platelets by anticoagulation protocol: Yes  Plan:  --12/27 at 0751 aPTT = 118 seconds, supratherapeutic  Will decrease heparin infusion to 1600 units/hr  --Will re-check aPTT in 8 hours to confirm  --Daily CBC per protocol while on heparin infusion --Cardiac catheterization deferred today 12/26. Continue to monitor plan per cardiology --Continue to  hold home apixaban while on heparin infusion  Aleksey Newbern A   05/17/2020   8:27 AM

## 2020-05-17 NOTE — Progress Notes (Addendum)
[facility]    SUBJECTIVE: Patient states to feel reasonably well still wearing oxygen on 4 to 5 L sats are only 94.  Can denies any chest pain denies significant leg swelling has not ambulated much good appetite sleeping well denies palpitations or tachycardia.  Patient wife was in the room as well providing much of the history because the patient has significant lapse in his memory she would prefer not to proceed with invasive procedures and prefers aggressive medical therapy.   Vitals:   05/16/20 2057 05/16/20 2332 05/17/20 0522 05/17/20 0859  BP: (!) 181/62 (!) 168/65 (!) 157/66 (!) 158/52  Pulse: 66 67 76 73  Resp: 18 20 18 18   Temp: 97.7 F (36.5 C) 98.9 F (37.2 C) 98.2 F (36.8 C) 98.3 F (36.8 C)  TempSrc: Oral Oral Oral Oral  SpO2: 96% 97% 94% 94%  Weight:      Height:         Intake/Output Summary (Last 24 hours) at 05/17/2020 [id number] Last data filed at 05/17/2020 0438 Gross per 24 hour  Intake 775.09 ml  Output 400 ml  Net 375.09 ml      PHYSICAL EXAM  General: Well developed, well nourished, in no acute distress HEENT:  Normocephalic and atramatic Neck:  No JVD.  Lungs: Diffuse rhonchi bilaterally to auscultation and percussion. Heart: HRRR . Normal S1 and S2 without gallops or murmurs.  Abdomen: Bowel sounds are positive, abdomen soft and non-tender  Msk:  Back normal, normal gait. Normal strength and tone for age. Extremities: No clubbing, cyanosis or edema.   Neuro: Alert and oriented X 3. Psych:  Good affect, responds appropriately   LABS: Basic Metabolic Panel: Recent Labs    05/16/20 0233 05/17/20 0009  NA 136 135  K 3.5 3.5  CL 96* 95*  CO2 34* 32  GLUCOSE 173* 397*  BUN 35* 36*  CREATININE 1.62* 1.78*  CALCIUM 8.0* 7.7*   Liver Function Tests: Recent Labs    05/14/20 1358  AST 29  ALT 35  ALKPHOS 109  BILITOT 1.1  PROT 6.2*  ALBUMIN 3.2*   No results for input(s): LIPASE, AMYLASE in the last 72 hours. CBC: Recent Labs     05/14/20 1358 05/15/20 1021 05/17/20 0009 05/17/20 0751  WBC 15.3*   < > 12.8* 11.8*  NEUTROABS 13.2*  --   --   --   HGB 11.1*   < > 9.8* 10.3*  HCT 35.3*   < > 31.0* 31.6*  MCV 86.9   < > 87.3 86.6  PLT 167   < > 147* 151   < > = values in this interval not displayed.   Cardiac Enzymes: No results for input(s): CKTOTAL, CKMB, CKMBINDEX, TROPONINI in the last 72 hours. BNP: Invalid input(s): POCBNP D-Dimer: No results for input(s): DDIMER in the last 72 hours. Hemoglobin A1C: No results for input(s): HGBA1C in the last 72 hours. Fasting Lipid Panel: No results for input(s): CHOL, HDL, LDLCALC, TRIG, CHOLHDL, LDLDIRECT in the last 72 hours. Thyroid Function Tests: No results for input(s): TSH, T4TOTAL, T3FREE, THYROIDAB in the last 72 hours.  Invalid input(s): FREET3 Anemia Panel: No results for input(s): VITAMINB12, FOLATE, FERRITIN, TIBC, IRON, RETICCTPCT in the last 72 hours.  No results found.   Echo previous echocardiogram with preserved left ventricular function with mild diastolic dysfunction  TELEMETRY: Atrial fibrillation atrial flutter bundle branch block rate of 70  ASSESSMENT AND PLAN:  Active Problems:   OSA on CPAP   Type  II diabetes mellitus with renal manifestations (HCC)   HTN (hypertension)   CAD (coronary artery disease)   Atrial flutter (HCC)   Acute on chronic diastolic CHF (congestive heart failure) (HCC)   CKD (chronic kidney disease), stage IIIa   Atrial fibrillation, chronic (HCC)   HCAP (healthcare-associated pneumonia) Non-STEMI Moderate to severe COPD Hypoxemia Obesity  Plan Agree with admission and treatment on telemetry Follow-up EKGs and troponins Will discontinue heparin after 24 hours Recommend Plavix for 6 to 12 months Defer cardiac cath now in favor of medical management Continue metoprolol lisinopril amlodipine  Lipitor but would add Plavix discontinue aspirin We will resume anticoagulation with Eliquis for paroxysmal  atrial fibrillation Continue diuretic therapy with Lasix to help with volume and heart failure Maintain Lipitor therapy for statin management Continue aggressive diabetes management and control with multiple medications as per primary Inhalers for aggressive management of COPD and hypoxemia Recommend consult pulmonary to help with pulmonary management and maximization of therapy especially with modest BNP of around 300 I am concerned that more of a pulmonary component than a heart failure component Continue monitoring chronic renal insufficiency stage III consider nephrology input Gradually increase activity to ambulate in the halls Continue supplemental oxygen for hypoxemia CPAP oxygen as indicated at bedtime for sleep apnea Continue omeprazole therapy for reflux type symptoms  Yolonda Kida, MD 05/17/2020 9:23 AM

## 2020-05-17 NOTE — Consult Note (Addendum)
NAME:  Ernest Mclean, MRN:  [id number], DOB:  Sep 22, 1940, LOS: 3 ADMISSION DATE:  05/14/2020, CONSULTATION DATE:  05-17-20 REFERRING MD:  Gabriel Earing, CHIEF COMPLAINT:  Hypoxia  Brief History:  This is a 79 year old male with history of COPD, peripheral vascular disease, obstructive sleep apnea, COPD on 2 L of oxygen, CAD, status post bypass graft, a flutter, and CKD stage III.  Patient presented to the hospital with headache found to be hypoxic requiring 5 L of oxygen.   History of Present Illness:  This is a 79 year old male with history of COPD, peripheral vascular disease, obstructive sleep apnea, COPD on 2 L of oxygen, CAD, status post bypass graft, a flutter, and CKD stage III.  Patient presented to the hospital with headache found to be hypoxic requiring 5 L of oxygen which escalated to 8 L of oxygen and now is back down to 5 L of oxygen.  Patient has been diuresed since admission.  Has been net negative about 300 cc's.  Pulmonary is consulted for failure to wean oxygen, and possible COPD exacerbation.  Patient notes that prior to admission he did not have any increased shortness of breath.  He did not have any increased sputum production.  But he did have increased chest congestion and chest tightness.  He notes that previous exacerbations he does not usually have increased sputum production or shortness of breath.  He notes his most recent COPD exacerbation was about 1 month ago.  He has not had any recent fevers.  He has not had any recent chills.  He continues to take his Advair and Spiriva and is compliant with all his daily medications.    Past Medical History:  COPD Peripheral vascular disease Obstructive sleep apnea COPD on 2 L of oxygen Coronary artery disease CKD 3  Significant Hospital Events:  Admission on 05-14-2020  Consults:  PCCM consult on 05-17-2020  Procedures:  Not applicable  Significant Diagnostic Tests:  Not applicable  Micro Data:  All blood cultures  have been negative. No sputum cultures. Influenza and Covid 2 -  Antimicrobials:  Vancomycin and cefepime initiated on day 1 of admission and have been discontinued since  Interim History / Subjective:  Not applicable  Objective   Blood pressure (!) 158/52, pulse 73, temperature 98.3 F (36.8 C), temperature source Oral, resp. rate 18, height 6' (1.829 m), weight 103.9 kg, SpO2 94 %.        Intake/Output Summary (Last 24 hours) at 05/17/2020 0911 Last data filed at 05/17/2020 0438 Gross per 24 hour  Intake 775.09 ml  Output 400 ml  Net 375.09 ml   Filed Weights   05/14/20 2252 05/15/20 0542 05/15/20 2014  Weight: 103.8 kg 104.1 kg 103.9 kg    Examination: General: Awake in no distress HENT: Moist mucous membranes Lungs: Wheezing in bilateral lower lung fields.  No acute distress patient able to speak in full sentences does not appear dyspneic Cardiovascular: Regular rate and rhythm Abdomen: Soft nontender nondistended Extremities: No focal deformities Neuro: No focal deficits cognition appears intact GU: Deferred       6MWT - 02/10/15 - distance: 232m - lowest saturation: 97% - no complaints during walk, no need for supplemental O2 with exertion  6MWT 09/20/2015 - distance 1165ft - lowest sat  95% - History of peripheral vascular disease and claudication, completed 8.5 minutes of 10 minutes of testing, stopped early due to leg pain  PFTs 02/10/15 postBD FEV1 75% postBD FVC 70% FEV1/FVC 77% RV 147 TLC  126 RV/TLC 110 DLCO - unable toperform Impression - decrease FEV1, preserved FEV1/FVC ratio. Mild/Mod obstruction on inhalers, air trapping/hyperinflation noted.  Significant response to BD, >27ml on FVC and FEV1 post BD   IMPRESSION: 1. No CT evidence of pulmonary artery embolus. 2. Small bilateral pleural effusions with associated partial compressive atelectasis of the lower lobes. 3. Diffuse interstitial and interlobular septal prominence  and diffuse ground-glass opacity throughout the lungs most consistent with edema. Pneumonia is not excluded clinical correlation is recommended. 4. Aortic Atherosclerosis (ICD10-I70.0).   Electronically Signed   By: Elgie Collard M.D.   On: 05/14/2020 16:16   Have reviewed CT chest from 05/14/2020 personally:   Most pertinent for groundglass opacities inter lobular septal thickening and bilateral pleural effusions. no focal infiltrates. no PE.     Resolved Hospital Problem list   Not applicable  Assessment & Plan:  This is a 79 year old with history as noted above for whom pulmonary is consulted for optimization of COPD regimen in the context of hypoxic respiratory failure.  Acute hypoxic respiratory failure-likely multifactorial patient does note that he had significant swelling on admission that has improved since then.  Patient on 5 L currently was on 8 L previously.  Patient with some symptoms of an exacerbation with wheeze.  Although does not have the classic increase sputum production or purulence. -Would treat as an exacerbation with prednisone 40 mg for 5 days -Would also entertain 5-day course of antibiotics.  As FEV1 relatively preserved do not think we need pseudomonal coverage at this point think it is okay to do 5-day course of azithromycin. -Would recommend Keeping  Patient at least  net -1 L as muich of this is likely related to heart failure -Also should follow-up with pulmonary physician may benefit from Roflumilast versus daily azithromycin for recurrent COPD exacerbations in the context of optimization of current therapy. -No previous sputum cultures to go by -Obtain sputum cultures if able -Ensure patient is receiving CPAP while here -Agree with LABA ICS combo and LAMA as currently receiving -Agree with incentive spirometry. -Agree with albuterol as needed      Labs   CBC: Recent Labs  Lab 05/14/20 1358 05/15/20 1021 05/16/20 0233 05/17/20 0009  05/17/20 0751  WBC 15.3* 12.1* 15.5* 12.8* 11.8*  NEUTROABS 13.2*  --   --   --   --   HGB 11.1* 10.1* 10.2* 9.8* 10.3*  HCT 35.3* 32.3* 32.2* 31.0* 31.6*  MCV 86.9 87.5 87.0 87.3 86.6  PLT 167 153 154 147* 151    Basic Metabolic Panel: Recent Labs  Lab 05/14/20 1358 05/15/20 0455 05/16/20 0233 05/17/20 0009  NA 135 137 136 135  K 4.0 4.0 3.5 3.5  CL 94* 96* 96* 95*  CO2 31 33* 34* 32  GLUCOSE 420* 474* 173* 397*  BUN 37* 37* 35* 36*  CREATININE 1.83* 1.91* 1.62* 1.78*  CALCIUM 8.3* 8.2* 8.0* 7.7*   GFR: Estimated Creatinine Clearance: 42.6 mL/min (A) (by C-G formula based on SCr of 1.78 mg/dL (H)). Recent Labs  Lab 05/14/20 1358 05/15/20 0455 05/15/20 1021 05/16/20 0233 05/17/20 0009 05/17/20 0751  PROCALCITON <0.10 <0.10  --  <0.10  --   --   WBC 15.3*  --  12.1* 15.5* 12.8* 11.8*  LATICACIDVEN 1.4  --   --   --   --   --     Liver Function Tests: Recent Labs  Lab 05/14/20 1358  AST 29  ALT 35  ALKPHOS 109  BILITOT 1.1  PROT 6.2*  ALBUMIN 3.2*   No results for input(s): LIPASE, AMYLASE in the last 168 hours. No results for input(s): AMMONIA in the last 168 hours.  ABG    Component Value Date/Time   HCO3 27.2 01/03/2019 1305   O2SAT 79.7 01/03/2019 1305     Coagulation Profile: Recent Labs  Lab 05/14/20 1358  INR 1.2    Cardiac Enzymes: No results for input(s): CKTOTAL, CKMB, CKMBINDEX, TROPONINI in the last 168 hours.  HbA1C: Hemoglobin A1C  Date/Time Value Ref Range Status  06/23/2014 05:22 AM 10.8 (H) 4.2 - 6.3 % Final    Comment:    The American Diabetes Association recommends that a primary goal of therapy should be <7% and that physicians should reevaluate the treatment regimen in patients with HbA1c values consistently >8%.    Hgb A1c MFr Bld  Date/Time Value Ref Range Status  05/01/2020 06:02 PM 7.9 (H) 4.8 - 5.6 % Final    Comment:    (NOTE) Pre diabetes:          5.7%-6.4%  Diabetes:              >6.4%  Glycemic  control for   <7.0% adults with diabetes   09/28/2019 04:29 AM 8.4 (H) 4.8 - 5.6 % Final    Comment:    (NOTE) Pre diabetes:          5.7%-6.4% Diabetes:              >6.4% Glycemic control for   <7.0% adults with diabetes     CBG: Recent Labs  Lab 05/16/20 0750 05/16/20 1222 05/16/20 1615 05/16/20 2100 05/17/20 0739  GLUCAP 160* 206* 285* 278* 258*    Review of Systems:   Pertinent positives and negatives per HPI.  The remainder of a 12 point review of systems was negative.   Past Medical History:  He,  has a past medical history of Atrial fibrillation ([place]), Basal cell carcinoma (06/2017), CAD (coronary artery disease), CKD (chronic kidney disease), Congestive heart failure ([place]), COPD (chronic obstructive pulmonary disease) ([place]), Diabetes ([place]), Hypertension, OSA on CPAP, and Squamous carcinoma (06/2017).   Surgical History:   Past Surgical History:  Procedure Laterality Date  . CLAVICLE SURGERY    . heart bypass    . HERNIA REPAIR    . MOHS SURGERY  07/10/2017   Head     Social History:   reports that he quit smoking about 18 years ago. His smoking use included cigarettes. He has a 30.00 pack-year smoking history. He has never used smokeless tobacco. He reports that he does not drink alcohol and does not use drugs.   Family History:  His family history includes Breast cancer in an other family member; Colon cancer in an other family member; Diabetes in an other family member; Stroke in an other family member.   Allergies Allergies  Allergen Reactions  . Codeine Hives, Rash and Swelling  . Hydralazine Other (See Comments)    tongue swollen and couldn't wake up ---- not positive it was this or a mix of this with something else or high sugar  . Hydrocodone-Acetaminophen Other (See Comments)  . Penicillins Other (See Comments)    Passed out (at 79 yrs old) Has patient had a PCN reaction causing immediate rash, facial/tongue/throat swelling, SOB or  lightheadedness with hypotension: No Has patient had a PCN reaction causing severe rash involving mucus membranes or skin necrosis: No Has patient had a PCN reaction that required hospitalization No  Has patient had a PCN reaction occurring within the last 10 years: No If all of the above answers are "NO", then may proceed with Cephalosporin use.      Home Medications  Prior to Admission medications   Medication Sig Start Date End Date Taking? Authorizing Provider  amLODipine (NORVASC) 5 MG tablet Take 5 mg by mouth daily.  08/17/14  Yes [provider]  apixaban (ELIQUIS) 2.5 MG TABS tablet Take 2.5 mg by mouth 2 (two) times daily.   Yes [provider]  aspirin EC 81 MG tablet Take 81 mg by mouth daily. 08/17/14  Yes [provider]  atorvastatin (LIPITOR) 20 MG tablet Take 20 mg by mouth daily. 10/08/19  Yes [provider]  azithromycin (ZITHROMAX) 250 MG tablet Take 250 mg by mouth daily. 05/13/20  Yes [provider]  citalopram (CELEXA) 20 MG tablet Take 20 mg by mouth at bedtime.  07/30/14  Yes [provider]  CRANBERRY PO Take 820 mg by mouth daily.   Yes [provider]  cyanocobalamin 500 MCG tablet Take 500 mcg by mouth daily.   Yes [provider]  diazepam (VALIUM) 5 MG tablet Take 5 mg by mouth every 12 (twelve) hours as needed for anxiety.  10/24/13  Yes [provider]  Fluticasone-Salmeterol (WIXELA INHUB) 500-50 MCG/DOSE AEPB Inhale 1 puff into the lungs 2 (two) times daily. 01/12/20  Yes Flora Lipps, MD  furosemide (LASIX) 20 MG tablet Take 20 mg by mouth daily.    Yes [provider]  HUMALOG 100 UNIT/ML injection Inject 10-25 Units into the skin 3 (three) times daily with meals. (sliding scale as needed to a maximum of 150u daily) 06/24/19  Yes [provider]  insulin glargine (LANTUS) 100 UNIT/ML injection Inject 60 Units into the skin at bedtime.    Yes [provider]   lisinopril (ZESTRIL) 10 MG tablet Take 10 mg by mouth 2 (two) times daily.   Yes [provider]  metFORMIN (GLUCOPHAGE-XR) 500 MG 24 hr tablet Take 1,000 mg by mouth daily with supper.   Yes [provider]  metoprolol tartrate (LOPRESSOR) 50 MG tablet Take 50 mg by mouth 2 (two) times daily. 03/09/20  Yes [provider]  OMEGA-3 FATTY ACIDS-VITAMIN E PO Take 1 capsule by mouth daily.   Yes [provider]  omeprazole (PRILOSEC) 20 MG capsule Take 20 mg by mouth daily.   Yes [provider]  pregabalin (LYRICA) 25 MG capsule Take 25 mg by mouth 2 (two) times daily.   Yes [provider]  tiotropium (SPIRIVA) 18 MCG inhalation capsule Place 18 mcg into inhaler and inhale daily. 07/31/14  Yes [provider]  vitamin C (ASCORBIC ACID) 500 MG tablet Take 500 mg by mouth daily.   Yes [provider]  albuterol (VENTOLIN HFA) 108 (90 Base) MCG/ACT inhaler INHALE 2 PUFFS INTO THE LUNGS EVERY 4 HOURS AS NEEDED FOR WHEEZING OR SHORTNESS OF BREATH 04/18/20   Flora Lipps, MD  BD INSULIN SYRINGE U/F 31G X 5/16" 0.5 ML MISC  10/10/19   [provider]  ergocalciferol (VITAMIN D2) 50000 units capsule Take 50,000 Units by mouth every 30 (thirty) days.    [provider]     Critical care time: 35 minutes

## 2020-05-17 NOTE — Progress Notes (Signed)
Mobility Specialist - Progress Note   05/17/20 1300  Mobility  Activity Ambulated in hall  Level of Assistance Standby assist, set-up cues, supervision of patient - no hands on  Assistive Device Front wheel walker  Distance Ambulated (ft) 620 ft  Mobility Response Tolerated well  Mobility performed by Mobility specialist  $Mobility charge 1 Mobility    While on RA: 85% supine Ambulation on 3L: 65% Ambulation on 4L: 78% Ambulation on 6L: 81%  Ambulation on 10L: >/= 90%     Pt was lying in bed upon arrival utilizing 5L [place] O2. Pt agreed to session. Per discussion with nurse, mobility was given the okay to wean pt onto room air. Pt denied any pain, nausea, or fatigue. Vitals were taken prior to activity: 67 HR, 94% SpO2 (5L). Pt's [place] was doffed while still in supine position, and O2 desat to 85%. Mobility reapplied  on 5L to get sats to increase to 95% before proceeding with session. Pt's wife entered room mid-session and discussed with mobility tech pt's declining overall performance just prior to admission. Mobility tech went over energy conservation methods with pt, which would then be applied for remainder of session. Several standing rest breaks taken during session. Pt's O2 was set on 3L as pt was able to get EOB with supervision. Upon standing and ambulating just outside of room door using incremental movements, pt's O2 desat to 65%. Noted no s/s of distress as pt denies SOB. Pt utilizing PLB. Mobility increased O2 to 6L to increase sats to 94%. Pt attempted ambulation on 4L and then 6L, saturations during ambulation recorded above. Noted that pt's O2 desats every ~100' or when engaging in conversation. Pt was able to continue remainder of ambulatory activity on 10L and sats maintained >/= 90%. Upon return to room, pt's O2 desat from 97% to 84% on (5L) while sitting EOB and engaging in conversation with spouse. Still no s/s of distress, pt continues to deny SOB. PLB and 6L were used to  increase sats to 93% before exit. Overall, pt tolerated session well. Pt was left in bed on 5L, all needs in reach, and alarm set. Nurse notified of performance.    Filiberto Pinks Mobility Specialist 05/17/20, 2:16 PM

## 2020-05-17 NOTE — Progress Notes (Addendum)
Inpatient Diabetes Program Recommendations  AACE/ADA: New Consensus Statement on Inpatient Glycemic Control (2015)  Target Ranges:  Prepandial:   less than 140 mg/dL      Peak postprandial:   less than 180 mg/dL (1-2 hours)      Critically ill patients:  140 - 180 mg/dL   Results for Ernest Mclean, Ernest Mclean (MRN [id number]) as of 05/17/2020 09:51  Ref. Range 05/16/2020 07:50 05/16/2020 12:22 05/16/2020 16:15 05/16/2020 21:00  Glucose-Capillary Latest Ref Range: 70 - 99 mg/dL 413 (H)  13 units NOVOLOG  206 (H)  15 units NOVOLOG  285 (H)  20 units NOVOLOG  278 (H)    25 units LANTUS   Results for Ernest Mclean, Ernest Mclean (MRN [id number]) as of 05/17/2020 09:51  Ref. Range 05/17/2020 07:39  Glucose-Capillary Latest Ref Range: 70 - 99 mg/dL 536 (H)  18 units NOVOLOG      Admit Acute on chronic hypoxemic respiratory failure/ Acute on chronic diastolic heart failure/ Elevated troponin  History: DM, CHF, CKD, COPD   Home DM Meds: Lantus 60 units QHS         Humalog 10-25 TID       Metformin 1000 mg QPM   Current Orders: Lantus 25 units QHS        Novolog 0-15 units TID       Novolog 10 units TID    MD- Please consider the following in-hospital insulin adjustments:  1. Increase Lantus to 35 units QHS (60% total home dose)  2. Increase Novolog Meal Coverage to 15 units TID with meals    --Will follow patient during hospitalization--  Ambrose Finland RN, MSN, CDE Diabetes Coordinator Inpatient Glycemic Control Team Team Pager: [phone] (8a-5p)

## 2020-05-17 NOTE — Progress Notes (Signed)
1        [place] [facility]   PATIENT NAME: Ernest Mclean    MR#:  [id number]  DATE OF BIRTH:  1940-07-06  SUBJECTIVE:  CHIEF COMPLAINT:   Chief Complaint  Patient presents with  . Headache  Patient denies any new complaints.  Requiring 5 L oxygen at rest and 10 L with ambulation.  Wife at bedside.  No cardiac cath planned for now. REVIEW OF SYSTEMS:  Review of Systems  Constitutional: Negative for diaphoresis, fever, malaise/fatigue and weight loss.  HENT: Negative for ear discharge, ear pain, hearing loss, nosebleeds, sore throat and tinnitus.   Eyes: Negative for blurred vision and pain.  Respiratory: Positive for shortness of breath. Negative for cough, hemoptysis and wheezing.   Cardiovascular: Negative for chest pain, palpitations, orthopnea and leg swelling.  Gastrointestinal: Negative for abdominal pain, blood in stool, constipation, diarrhea, heartburn, nausea and vomiting.  Genitourinary: Negative for dysuria, frequency and urgency.  Musculoskeletal: Negative for back pain and myalgias.  Skin: Negative for itching and rash.  Neurological: Positive for headaches. Negative for dizziness, tingling, tremors, focal weakness, seizures and weakness.  Psychiatric/Behavioral: Negative for depression. The patient is not nervous/anxious.    DRUG ALLERGIES:   Allergies  Allergen Reactions  . Codeine Hives, Rash and Swelling  . Hydralazine Other (See Comments)    tongue swollen and couldn't wake up ---- not positive it was this or a mix of this with something else or high sugar  . Hydrocodone-Acetaminophen Other (See Comments)  . Penicillins Other (See Comments)    Passed out (at 79 yrs old) Has patient had a PCN reaction causing immediate rash, facial/tongue/throat swelling, SOB or lightheadedness with hypotension: No Has patient had a PCN reaction causing severe rash involving mucus membranes or skin necrosis: No Has patient had a PCN reaction that required  hospitalization No Has patient had a PCN reaction occurring within the last 10 years: No If all of the above answers are "NO", then may proceed with Cephalosporin use.    VITALS:  Blood pressure (!) 150/69, pulse 65, temperature 98 F (36.7 C), resp. rate (!) 22, height 6' (1.829 m), weight 103.9 kg, SpO2 96 %. PHYSICAL EXAMINATION:  Physical Exam Constitutional:      Appearance: He is obese.  HENT:     Head: Normocephalic and atraumatic.  Eyes:     Conjunctiva/sclera: Conjunctivae normal.     Pupils: Pupils are equal, round, and reactive to light.  Neck:     Thyroid: No thyromegaly.     Trachea: No tracheal deviation.  Cardiovascular:     Rate and Rhythm: Normal rate and regular rhythm.     Heart sounds: Normal heart sounds.  Pulmonary:     Effort: Pulmonary effort is normal. No respiratory distress.     Breath sounds: Examination of the right-lower field reveals decreased breath sounds. Examination of the left-lower field reveals decreased breath sounds. Decreased breath sounds and wheezing present.  Chest:     Chest wall: No tenderness.  Abdominal:     General: Bowel sounds are normal. There is no distension.     Palpations: Abdomen is soft.     Tenderness: There is no abdominal tenderness.  Musculoskeletal:        General: Normal range of motion.     Cervical back: Normal range of motion and neck supple.  Skin:    General: Skin is warm and dry.     Findings: No rash.  Neurological:  Mental Status: He is alert and oriented to person, place, and time.     Cranial Nerves: No cranial nerve deficit.    LABORATORY PANEL:  Male CBC Recent Labs  Lab 05/17/20 0751  WBC 11.8*  HGB 10.3*  HCT 31.6*  PLT 151   ------------------------------------------------------------------------------------------------------------------ Chemistries  Recent Labs  Lab 05/14/20 1358 05/15/20 0455 05/17/20 0009  NA 135   < > 135  K 4.0   < > 3.5  CL 94*   < > 95*  CO2 31   <  > 32  GLUCOSE 420*   < > 397*  BUN 37*   < > 36*  CREATININE 1.83*   < > 1.78*  CALCIUM 8.3*   < > 7.7*  AST 29  --   --   ALT 35  --   --   ALKPHOS 109  --   --   BILITOT 1.1  --   --    < > = values in this interval not displayed.   RADIOLOGY:  No results found. ASSESSMENT AND PLAN:  79 year old male with a known history of coronary disease, diastolic CHF, A. fib, CKD, OSA, type 2 diabetes, COPD on 2 L oxygen chronically was admitted for headache and some chest tightness/shortness of breath  Acute on chronic hypoxic respiratory failure Acute on chronic diastolic heart failure He required 4 L oxygen via nasal cannula in the ED and normally he is on 2 L at home -Nursing checked his oxygen qualification.  See their note from today/December 27 - He required 10 L on ambulation and 5 L at rest. While on RA: 85% supine Ambulation on 3L: 65% Ambulation on 4L: 78% Ambulation on 6L: 81%  Ambulation on 10L: >/= 90%    This is likely multifactorial from underlying COPD, heart failure exacerbation, OSA, pulmonary edema/fluid overload -We will increase his Lasix from 20 mg to 40 mg IV twice daily as his BNP has gone up from 358-> 482  Elevated troponin due to non-STEMI 186->190->785->616 -Conservative management per Dr. Etta Quill discussion with patient and spouse Patient has already received 24 hours of heparin.  We will stop heparin and switch him to oral Eliquis  CAD: Cardiology Dr. Clayborn Bigness recommends switching from aspirin to Plavix and continue Eliquis.  Continue Lipitor and metoprolol I have placed the orders on 12/27 to reflect above change.  Wife and patient updated and in agreement  Headache Intermittent.  Symptomatic management for now  Hypertension: Continue amlodipine, metoprolol, lisinopril  A flutter/A. fib: Heart rate controlled on metoprolol, continue Eliquis  Depression/anxiety: Continue citalopram, diazepam  COPD exacerbation: Continue fluticasone-salmeterol,  Spiriva, albuterol, add IV Solu-Medrol 60 mg daily  DM: Hold metformin, Increase insulin Lantus to 35 units subcu nightly and NovoLog to 15 units subcu 3 times daily with meals Continue sliding scale insulin  GERD: Continue PPI  Neuropathy: Continue pregabalin  Constipation He does report 1 bowel movement on 12/26 Add Senokot and MiraLAX  Obesity Body mass index is 31.06 kg/m.    He remains at high risk for cardiorespiratory failure and death.  Overall poor prognosis, we will consult palliative care  Status is: Inpatient  Remains inpatient appropriate because:Inpatient level of care appropriate due to severity of illness   Dispo: The patient is from: Home              Anticipated d/c is to: Home              Anticipated d/c date is: 2 days  Patient currently is not medically stable to d/c.  He still remains quite hypoxic and requiring 5 to 10 L oxygen.  Not safe to discharge him home this way.    DVT prophylaxis:        Eliquis    Family Communication: ( "discussed with patient").  Updated wife at bedside on 12/27   All the records are reviewed and case discussed with Care Management/Social Worker. Management plans discussed with the patient, nursing, wife and they are in agreement.  CODE STATUS: DNR  TOTAL TIME TAKING CARE OF THIS PATIENT: 35 minutes.   More than 50% of the time was spent in counseling/coordination of care: YES  POSSIBLE D/C IN 2-3 DAYS, DEPENDING ON CLINICAL CONDITION.    Max Sane M.D on 05/17/2020 at 2:20 PM  [facility]   CC: Primary care physician; Juluis Pitch, MD  Note: This dictation was prepared with Dragon dictation along with smaller phrase technology. Any transcriptional errors that result from this process are unintentional.

## 2020-05-17 NOTE — Progress Notes (Signed)
ANTICOAGULATION CONSULT NOTE   Pharmacy Consult for Heparin drip Indication: chest pain/ACS  Patient Measurements: Heparin Dosing Weight: 99.1 kg  Labs: Recent Labs    05/14/20 1358 05/14/20 1358 05/15/20 0455 05/15/20 0855 05/15/20 1021 05/15/20 1548 05/16/20 0233 05/16/20 1519 05/17/20 0009  HGB 11.1*  --   --   --  10.1*  --  10.2*  --  9.8*  HCT 35.3*  --   --   --  32.3*  --  32.2*  --  31.0*  PLT 167  --   --   --  153  --  154  --  147*  APTT  --   --   --   --  28   < > 50* 61* 83*  LABPROT 14.6  --   --   --   --   --   --   --   --   INR 1.2  --   --   --   --   --   --   --   --   HEPARINUNFRC  --    < >  --   --  1.23*  --  0.90* 0.63 0.74*  CREATININE 1.83*  --  1.91*  --   --   --  1.62*  --   --   TROPONINIHS  --   --   --  785* 616*  --   --   --   --    < > = values in this interval not displayed.    Estimated Creatinine Clearance: 46.8 mL/min (A) (by C-G formula based on SCr of 1.62 mg/dL (H)).  Medical History: Past Medical History:  Diagnosis Date  . Atrial fibrillation (HCC)   . Basal cell carcinoma 06/2017   Left Ear  . CAD (coronary artery disease)   . CKD (chronic kidney disease)   . Congestive heart failure (HCC)   . COPD (chronic obstructive pulmonary disease) (HCC)   . Diabetes (HCC)   . Hypertension   . OSA on CPAP   . Squamous carcinoma 06/2017   head and nose   Infusions:  . sodium chloride    . heparin 1,700 Units/hr (05/16/20 2323)    Assessment: 79 y.o. male presenting with SOB and found to have c/f ACS/STEMI. Patient was initially continued on home dose of apixaban (2.5mg  bid for afib, last dose 12/25 0855). Pharmacy has now been consulted for IV heparin dosing. Baseline heparin level elevated as expected at 1.23 due to time of last apixaban dose. Baseline aPTT 28.  Will dose heparin infusion based on aPTT until levels correlate.  Goal of Therapy:  Heparin level 0.3-0.7 units/ml aPTT 66-102 Monitor platelets by  anticoagulation protocol: Yes  Plan:  --12/27 at 0009 aPTT = 83 seconds, therapeutic now. Heparin level = 0.74. Levels still not correlating. Will continue heparin infusion at 1700 units/hr --Will re-check aPTT in 8 hours to confirm  --Daily CBC per protocol while on heparin infusion --Cardiac catheterization deferred today. Continue to monitor plan per cardiology --Continue to hold home apixaban while on heparin infusion  Valrie Hart A   05/17/2020   12:59 AM

## 2020-05-17 NOTE — Progress Notes (Signed)
SATURATION QUALIFICATIONS: (This note is used to comply with regulatory documentation for home oxygen)  Patient Saturations on Room Air at Rest = 78%   Patient Saturations on 10 Liters of oxygen while Ambulating = 92%  Please briefly explain why patient needs home oxygen:  When pt is at rest on 5L of oxygen his oxygen saturation stays at 93%.

## 2020-05-17 NOTE — Care Management Important Message (Signed)
Important Message  Patient Details  Name: Ernest Mclean MRN: [id number] Date of Birth: 11/27/1940   Medicare Important Message Given:  N/A - LOS <3 / Initial given by admissions  Initial Medicare IM reviewed with patient by Francesca Oman, Patient Access Associate on 05/16/2020 at 1:10pm.    [name] 05/17/2020, 9:23 AM

## 2020-05-18 DIAGNOSIS — I4892 Unspecified atrial flutter: Secondary | ICD-10-CM | POA: Diagnosis not present

## 2020-05-18 DIAGNOSIS — Z515 Encounter for palliative care: Secondary | ICD-10-CM

## 2020-05-18 DIAGNOSIS — I482 Chronic atrial fibrillation, unspecified: Secondary | ICD-10-CM | POA: Diagnosis not present

## 2020-05-18 DIAGNOSIS — E1122 Type 2 diabetes mellitus with diabetic chronic kidney disease: Secondary | ICD-10-CM

## 2020-05-18 DIAGNOSIS — R531 Weakness: Secondary | ICD-10-CM

## 2020-05-18 DIAGNOSIS — I5033 Acute on chronic diastolic (congestive) heart failure: Secondary | ICD-10-CM | POA: Diagnosis not present

## 2020-05-18 DIAGNOSIS — Z66 Do not resuscitate: Secondary | ICD-10-CM

## 2020-05-18 DIAGNOSIS — I2510 Atherosclerotic heart disease of native coronary artery without angina pectoris: Secondary | ICD-10-CM | POA: Diagnosis not present

## 2020-05-18 DIAGNOSIS — N1832 Chronic kidney disease, stage 3b: Secondary | ICD-10-CM

## 2020-05-18 DIAGNOSIS — N1831 Chronic kidney disease, stage 3a: Secondary | ICD-10-CM | POA: Diagnosis not present

## 2020-05-18 DIAGNOSIS — R0600 Dyspnea, unspecified: Secondary | ICD-10-CM | POA: Diagnosis not present

## 2020-05-18 DIAGNOSIS — J9621 Acute and chronic respiratory failure with hypoxia: Secondary | ICD-10-CM | POA: Diagnosis not present

## 2020-05-18 LAB — CBC
HCT: 30.9 % — ABNORMAL LOW (ref 39.0–52.0)
Hemoglobin: 9.7 g/dL — ABNORMAL LOW (ref 13.0–17.0)
MCH: 27.1 pg (ref 26.0–34.0)
MCHC: 31.4 g/dL (ref 30.0–36.0)
MCV: 86.3 fL (ref 80.0–100.0)
Platelets: 148 10*3/uL — ABNORMAL LOW (ref 150–400)
RBC: 3.58 MIL/uL — ABNORMAL LOW (ref 4.22–5.81)
RDW: 16.7 % — ABNORMAL HIGH (ref 11.5–15.5)
WBC: 11.4 10*3/uL — ABNORMAL HIGH (ref 4.0–10.5)
nRBC: 0 % (ref 0.0–0.2)

## 2020-05-18 LAB — GLUCOSE, CAPILLARY
Glucose-Capillary: 419 mg/dL — ABNORMAL HIGH (ref 70–99)
Glucose-Capillary: 510 mg/dL (ref 70–99)
Glucose-Capillary: 357 mg/dL — ABNORMAL HIGH (ref 70–99)
Glucose-Capillary: 250 mg/dL — ABNORMAL HIGH (ref 70–99)

## 2020-05-18 LAB — BASIC METABOLIC PANEL
Anion gap: 9 (ref 5–15)
BUN: 40 mg/dL — ABNORMAL HIGH (ref 8–23)
CO2: 30 mmol/L (ref 22–32)
Calcium: 8.4 mg/dL — ABNORMAL LOW (ref 8.9–10.3)
Chloride: 97 mmol/L — ABNORMAL LOW (ref 98–111)
Creatinine, Ser: 1.48 mg/dL — ABNORMAL HIGH (ref 0.61–1.24)
GFR, Estimated: 48 mL/min — ABNORMAL LOW (ref >60)
Glucose, Bld: 402 mg/dL — ABNORMAL HIGH (ref 70–99)
Potassium: 3.9 mmol/L (ref 3.5–5.1)
Sodium: 136 mmol/L (ref 135–145)

## 2020-05-18 MED ORDER — INSULIN ASPART 100 UNIT/ML ~~LOC~~ SOLN
0.0000 [IU] | Freq: Three times a day (TID) | SUBCUTANEOUS | Status: DC
  Administered 2020-05-18 (×2): 20 [IU] via SUBCUTANEOUS
  Administered 2020-05-18: 09:00:00 15 [IU] via SUBCUTANEOUS
  Administered 2020-05-19: 12:00:00 11 [IU] via SUBCUTANEOUS
  Administered 2020-05-19: 17:00:00 15 [IU] via SUBCUTANEOUS
  Administered 2020-05-19 – 2020-05-20 (×2): 7 [IU] via SUBCUTANEOUS
  Administered 2020-05-20: 12:00:00 20 [IU] via SUBCUTANEOUS
  Filled 2020-05-18 (×7): qty 1

## 2020-05-18 MED ORDER — INSULIN GLARGINE 100 UNIT/ML ~~LOC~~ SOLN
10.0000 [IU] | Freq: Once | SUBCUTANEOUS | Status: AC
  Administered 2020-05-18: 16:00:00 10 [IU] via SUBCUTANEOUS
  Filled 2020-05-18: qty 0.1

## 2020-05-18 MED ORDER — INSULIN ASPART 100 UNIT/ML ~~LOC~~ SOLN
0.0000 [IU] | Freq: Every day | SUBCUTANEOUS | Status: DC
  Administered 2020-05-18: 22:00:00 2 [IU] via SUBCUTANEOUS
  Administered 2020-05-19: 3 [IU] via SUBCUTANEOUS
  Filled 2020-05-18 (×2): qty 1

## 2020-05-18 MED ORDER — GUAIFENESIN-DM 100-10 MG/5ML PO SYRP
5.0000 mL | ORAL_SOLUTION | ORAL | Status: DC | PRN
  Administered 2020-05-18: 01:00:00 5 mL via ORAL
  Filled 2020-05-18: qty 5

## 2020-05-18 MED ORDER — INSULIN GLARGINE 100 UNIT/ML ~~LOC~~ SOLN
45.0000 [IU] | Freq: Every day | SUBCUTANEOUS | Status: DC
  Administered 2020-05-18 – 2020-05-19 (×2): 45 [IU] via SUBCUTANEOUS
  Filled 2020-05-18 (×3): qty 0.45

## 2020-05-18 MED ORDER — INSULIN ASPART 100 UNIT/ML ~~LOC~~ SOLN
20.0000 [IU] | Freq: Three times a day (TID) | SUBCUTANEOUS | Status: DC
  Administered 2020-05-18 – 2020-05-20 (×7): 20 [IU] via SUBCUTANEOUS
  Filled 2020-05-18 (×5): qty 1

## 2020-05-18 NOTE — Consult Note (Signed)
Consultation Note Date: 05/18/2020   Patient Name: Ernest Mclean  DOB: February 06, 1941  MRN: [id number]  Age / Sex: 79 y.o., male  PCP: Juluis Pitch, MD Referring Physician: Max Sane, MD  Reason for Consultation: Establishing goals of care and Psychosocial/spiritual support  HPI/Patient Profile: 79 y.o. male   admitted on 05/14/2020 with past medical  history of CAD, diastolic CHF, A. fib, CKD, OSA, type 2 diabetes, COPD on 2 L home O2 with reported continued physical and functional decline over the past many months.  Recent admission  earlier this month with COPD exacerbation and acute heart failure exacerbation.    Multiple co-morbidites  Wife reports decline specific to increased oxygen needs on minimal exertion at home.  More difficult;t at home with increasing care needs.   In the ED initial vital signs notable for hypertension with blood pressures ranging 160s to 170s over 60s to 70s, and increased oxygen requirement at 4 L.  Lab work-up notable for CMP with glucose 420, creatinine 1.83 (at baseline), CBC with mild anemia and leukocytosis at baseline, negative procalcitonin and lactic acid, BNP at close to baseline of 336.  Respiratory viral panel negative for influenza and Covid.    Blood cultures obtained.  Chest x-ray is unremarkable, as was CT head.  CTPA was obtained which showed no evidence of PE but did show findings consistent with edema versus infection.  He was started on vancomycin and cefepime for presumed HCAP, and also given insulin for his hyperglycemia and DuoNebs for his respiratory symptoms.  Patient and family face treatment option decisions, advanced directive decisions and anticipatory care needs.  Clinical Assessment and Goals of Care:  This NP [name] reviewed medical records, received report from team, assessed the patient and then meet at the patient's bedside along ith  his wife to discuss diagnosis, prognosis, GOC, EOL wishes disposition and options.   Concept of Palliative Care was introduced as specialized medical care for people and their families living with serious illness.  If focuses on providing relief from the symptoms and stress of a serious illness.  The goal is to improve quality of life for both the patient and the family.  Values and goals of care important to patient and family were attempted to be elicited.  Created space and opportunity for patient  and wife to explore thoughts and feelings regarding current medical information.  On exploration both were able to speak to their understanding of the seriousness of his current medical situation and likely long term poor prognosis and the importance of conversation and documentation of ACP and treatment plan into the future  A  discussion was had today regarding advanced directives.  Concepts specific to code status, artifical feeding and hydration, continued IV antibiotics and rehospitalization was had.  The difference between a aggressive medical intervention path  and a palliative comfort care path for this patient at this time was had.     MOST form introduced  Education offered regarding Hospice benefit in the home.  Patient verbalizes his  openness to hospice, "I just want to make this easier on everyone" Wife verbalizes concern over the fact that their children do not understand the situation.  Open to suggestion to meet by conference call for family meeting. Meeting is scheduled for tomorrow at 11:30 with this NP     Questions and concerns addressed.  Patient  encouraged to call with questions or concerns.     PMT will continue to support holistically.     Patient is capable of making his own medical decisions at this point in time however he does have his wife documented as HPOA.      SUMMARY OF RECOMMENDATIONS    Code Status/Advance Care Planning:  DNR   Palliative  Prophylaxis:   Aspiration, Bowel Regimen, Delirium Protocol, Frequent Pain Assessment and Oral Care  Additional Recommendations (Limitations, Scope, Preferences):  Full Scope Treatment until discharge plan is clarified  Psycho-social/Spiritual:   Desire for further Chaplaincy support:no  Additional Recommendations: Education on Hospice  Prognosis:   < than 6 months  Discharge Planning: To Be Determined      Primary Diagnoses: Present on Admission: . HCAP (healthcare-associated pneumonia) . Type II diabetes mellitus with renal manifestations (HCC) . HTN (hypertension) . CAD (coronary artery disease) . Atrial flutter (HCC) . Acute on chronic diastolic CHF (congestive heart failure) (HCC) . CKD (chronic kidney disease), stage IIIa . Atrial fibrillation, chronic (HCC)   I have reviewed the medical record, interviewed the patient and family, and examined the patient. The following aspects are pertinent.  Past Medical History:  Diagnosis Date  . Atrial fibrillation (HCC)   . Basal cell carcinoma 06/2017   Left Ear  . CAD (coronary artery disease)   . CKD (chronic kidney disease)   . Congestive heart failure (HCC)   . COPD (chronic obstructive pulmonary disease) (HCC)   . Diabetes (HCC)   . Hypertension   . OSA on CPAP   . Squamous carcinoma 06/2017   head and nose   Social History   Socioeconomic History  . Marital status: Married    Spouse name: Not on file  . Number of children: Not on file  . Years of education: Not on file  . Highest education level: Not on file  Occupational History  . Not on file  Tobacco Use  . Smoking status: Former Smoker    Packs/day: 1.00    Years: 30.00    Pack years: 30.00    Types: Cigarettes    Quit date: 12/14/2001    Years since quitting: 18.4  . Smokeless tobacco: Never Used  . Tobacco comment: quit 12/14/2001  Vaping Use  . Vaping Use: Never used  Substance and Sexual Activity  . Alcohol use: No    Alcohol/week:  0.0 standard drinks  . Drug use: No  . Sexual activity: Not on file  Other Topics Concern  . Not on file  Social History Narrative  . Not on file   Social Determinants of Health   Financial Resource Strain: Not on file  Food Insecurity: Not on file  Transportation Needs: Not on file  Physical Activity: Not on file  Stress: Not on file  Social Connections: Not on file   Family History  Problem Relation Age of Onset  . Stroke Other   . Diabetes Other   . Breast cancer Other   . Colon cancer Other    Scheduled Meds: . albuterol  1 puff Inhalation Q4H  . amLODipine  5 mg Oral Daily  .  apixaban  2.5 mg Oral BID  . vitamin C  500 mg Oral Daily  . atorvastatin  20 mg Oral Daily  . citalopram  20 mg Oral QHS  . clopidogrel  75 mg Oral Daily  . fluticasone furoate-vilanterol  1 puff Inhalation Daily  . furosemide  40 mg Intravenous BID  . insulin aspart  0-20 Units Subcutaneous TID WC  . insulin aspart  0-5 Units Subcutaneous QHS  . insulin aspart  20 Units Subcutaneous TID WC  . insulin glargine  10 Units Subcutaneous Once  . insulin glargine  45 Units Subcutaneous QHS  . lisinopril  10 mg Oral BID  . methylPREDNISolone (SOLU-MEDROL) injection  60 mg Intravenous Q24H  . metoprolol tartrate  50 mg Oral BID  . pantoprazole  40 mg Oral Daily  . polyethylene glycol  17 g Oral Daily  . pregabalin  25 mg Oral BID  . senna-docusate  2 tablet Oral BID  . sodium chloride flush  3 mL Intravenous Q12H  . sodium chloride flush  3 mL Intravenous Q12H  . tiotropium  18 mcg Inhalation Daily  . vitamin B-12  500 mcg Oral Daily  . Vitamin D (Ergocalciferol)  50,000 Units Oral Q30 days   Continuous Infusions: . sodium chloride     PRN Meds:.sodium chloride, acetaminophen, diazepam, guaiFENesin-dextromethorphan, ondansetron (ZOFRAN) IV, sodium chloride flush Medications Prior to Admission:  Prior to Admission medications   Medication Sig Start Date End Date Taking? Authorizing Provider   amLODipine (NORVASC) 5 MG tablet Take 5 mg by mouth daily.  08/17/14  Yes [provider]  apixaban (ELIQUIS) 2.5 MG TABS tablet Take 2.5 mg by mouth 2 (two) times daily.   Yes [provider]  aspirin EC 81 MG tablet Take 81 mg by mouth daily. 08/17/14  Yes [provider]  atorvastatin (LIPITOR) 20 MG tablet Take 20 mg by mouth daily. 10/08/19  Yes [provider]  azithromycin (ZITHROMAX) 250 MG tablet Take 250 mg by mouth daily. 05/13/20  Yes [provider]  citalopram (CELEXA) 20 MG tablet Take 20 mg by mouth at bedtime.  07/30/14  Yes [provider]  CRANBERRY PO Take 820 mg by mouth daily.   Yes [provider]  cyanocobalamin 500 MCG tablet Take 500 mcg by mouth daily.   Yes [provider]  diazepam (VALIUM) 5 MG tablet Take 5 mg by mouth every 12 (twelve) hours as needed for anxiety.  10/24/13  Yes [provider]  Fluticasone-Salmeterol (WIXELA INHUB) 500-50 MCG/DOSE AEPB Inhale 1 puff into the lungs 2 (two) times daily. 01/12/20  Yes Flora Lipps, MD  furosemide (LASIX) 20 MG tablet Take 20 mg by mouth daily.    Yes [provider]  HUMALOG 100 UNIT/ML injection Inject 10-25 Units into the skin 3 (three) times daily with meals. (sliding scale as needed to a maximum of 150u daily) 06/24/19  Yes [provider]  insulin glargine (LANTUS) 100 UNIT/ML injection Inject 60 Units into the skin at bedtime.    Yes [provider]  lisinopril (ZESTRIL) 10 MG tablet Take 10 mg by mouth 2 (two) times daily.   Yes [provider]  metFORMIN (GLUCOPHAGE-XR) 500 MG 24 hr tablet Take 1,000 mg by mouth daily with supper.   Yes [provider]  metoprolol tartrate (LOPRESSOR) 50 MG tablet Take 50 mg by mouth 2 (two) times daily. 03/09/20  Yes [provider]  OMEGA-3 FATTY ACIDS-VITAMIN E PO Take 1 capsule by  mouth daily.   Yes [provider]  omeprazole (PRILOSEC) 20  MG capsule Take 20 mg by mouth daily.   Yes [provider]  pregabalin (LYRICA) 25 MG capsule Take 25 mg by mouth 2 (two) times daily.   Yes [provider]  tiotropium (SPIRIVA) 18 MCG inhalation capsule Place 18 mcg into inhaler and inhale daily. 07/31/14  Yes [provider]  vitamin C (ASCORBIC ACID) 500 MG tablet Take 500 mg by mouth daily.   Yes [provider]  albuterol (VENTOLIN HFA) 108 (90 Base) MCG/ACT inhaler INHALE 2 PUFFS INTO THE LUNGS EVERY 4 HOURS AS NEEDED FOR WHEEZING OR SHORTNESS OF BREATH 04/18/20   Flora Lipps, MD  BD INSULIN SYRINGE U/F 31G X 5/16" 0.5 ML MISC  10/10/19   [provider]  ergocalciferol (VITAMIN D2) 50000 units capsule Take 50,000 Units by mouth every 30 (thirty) days.    [provider]   Allergies  Allergen Reactions  . Codeine Hives, Rash and Swelling  . Hydralazine Other (See Comments)    tongue swollen and couldn't wake up ---- not positive it was this or a mix of this with something else or high sugar  . Hydrocodone-Acetaminophen Other (See Comments)  . Penicillins Other (See Comments)    Passed out (at 79 yrs old) Has patient had a PCN reaction causing immediate rash, facial/tongue/throat swelling, SOB or lightheadedness with hypotension: No Has patient had a PCN reaction causing severe rash involving mucus membranes or skin necrosis: No Has patient had a PCN reaction that required hospitalization No Has patient had a PCN reaction occurring within the last 10 years: No If all of the above answers are "NO", then may proceed with Cephalosporin use.    Review of Systems  Respiratory: Positive for shortness of breath.   Neurological: Positive for weakness.    Physical Exam Constitutional:      Appearance: He is overweight. He is ill-appearing.     Interventions: Nasal cannula in place.  Cardiovascular:     Rate and Rhythm: Rhythm irregular.  Pulmonary:     Breath sounds: Decreased air  movement present.  Skin:    General: Skin is warm and dry.  Neurological:     Mental Status: He is alert and oriented to person, place, and time.     Vital Signs: BP (!) 159/58 (BP Location: Left Arm)   Pulse 65   Temp 98.4 F (36.9 C) (Oral)   Resp 17   Ht 6' (1.829 m)   Wt 104.4 kg   SpO2 92%   BMI 31.22 kg/m  Pain Scale: 0-10 POSS *See Group Information*: S-Acceptable,Sleep, easy to arouse Pain Score: Asleep   SpO2: SpO2: 92 % O2 Device:SpO2: 92 % O2 Flow Rate: .O2 Flow Rate (L/min): 5 L/min  IO: Intake/output summary:   Intake/Output Summary (Last 24 hours) at 05/18/2020 1559 Last data filed at 05/18/2020 1354 Gross per 24 hour  Intake 1200 ml  Output 1900 ml  Net -700 ml    LBM: Last BM Date: 05/17/20 Baseline Weight: Weight: 101 kg Most recent weight: Weight: 104.4 kg     Palliative Assessment/Data: 40 %    Discussed with Dr Manuella Ghazi and LCSW  Time In: 1300 Time Out: 1500 Time Total: 120 minutes Greater than 50%  of this time was spent counseling and coordinating care related to the above assessment and plan.  Signed by: [name], NP   Please contact Palliative Medicine Team phone at [phone] for questions  and concerns.  For individual provider: See Shea Evans

## 2020-05-18 NOTE — Progress Notes (Signed)
Inpatient Diabetes Program Recommendations  AACE/ADA: New Consensus Statement on Inpatient Glycemic Control   Target Ranges:  Prepandial:   less than 140 mg/dL      Peak postprandial:   less than 180 mg/dL (1-2 hours)      Critically ill patients:  140 - 180 mg/dL  Results for Mclean, Ernest MASTON (MRN [id number]) as of 05/18/2020 07:46  Ref. Range 05/18/2020 06:20  Glucose Latest Ref Range: 70 - 99 mg/dL 458 (H)   Results for Mclean, Ernest HOSICK (MRN [id number]) as of 05/18/2020 07:46  Ref. Range 05/17/2020 07:39 05/17/2020 11:52 05/17/2020 16:02 05/17/2020 21:54  Glucose-Capillary Latest Ref Range: 70 - 99 mg/dL 863 (H)  Novolog 18 units 358 (H)  Novolog 30 units 116 (H) 319 (H)    Lantus 35 units   Review of Glycemic Control  Diabetes history: DM2 Outpatient Diabetes medications: Lantus 60 units QHS, Humalog 10-25 units TID, Metformin 1000 mg QPM Current orders for Inpatient glycemic control: Lantus 35 units QHS, Novolog 15 units TID with meals, Novolog 0-15 units TID with meals; Solumedrol 60 mg Q24H  Inpatient Diabetes Program Recommendations:    Insulin: Noted meal coverage was not given with supper on 05/17/20 and Solumedrol started 05/17/20.  If steroids are continued as ordered, please consider increasing Lantus to 45 units QHS and adding Novolog 0-5 units QHS for bedtime correction.  Thanks, Orlando Penner, RN, MSN, CDE Diabetes Coordinator Inpatient Diabetes Program [phone] (Team Pager from 8am to 5pm)

## 2020-05-18 NOTE — Consult Note (Addendum)
NAME:  Ernest Mclean, MRN:  [id number], DOB:  07/22/1940, LOS: 4 ADMISSION DATE:  05/14/2020, CONSULTATION DATE:  05-17-20 REFERRING MD:  Gabriel Earing, CHIEF COMPLAINT:  Hypoxia  Brief History:  This is a 79 year old male with history of COPD, peripheral vascular disease, obstructive sleep apnea, COPD on 2 L of oxygen, CAD, status post bypass graft, a flutter, and CKD stage III.  Patient presented to the hospital with headache found to be hypoxic requiring 5 L of oxygen.   History of Present Illness:  This is a 79 year old male with history of COPD, peripheral vascular disease, obstructive sleep apnea, COPD on 2 L of oxygen, CAD, status post bypass graft, a flutter, and CKD stage III.  Patient presented to the hospital with headache found to be hypoxic requiring 5 L of oxygen which escalated to 8 L of oxygen and now is back down to 5 L of oxygen.  Patient has been diuresed since admission.  Has been net negative about 300 cc's.  Pulmonary is consulted for failure to wean oxygen, and possible COPD exacerbation.  Patient notes that prior to admission he did not have any increased shortness of breath.  He did not have any increased sputum production.  But he did have increased chest congestion and chest tightness.  He notes that previous exacerbations he does not usually have increased sputum production or shortness of breath.  He notes his most recent COPD exacerbation was about 1 month ago.  He has not had any recent fevers.  He has not had any recent chills.  He continues to take his Advair and Spiriva and is compliant with all his daily medications.   Updates-overnight patient did fairly well.  He states that his breathing is improved.  He says the swelling in lower extremities is improved.  Wheezing on exam has improved as well.  Remains on 5 L this morning   Past Medical History:  COPD Peripheral vascular disease Obstructive sleep apnea COPD on 2 L of oxygen Coronary artery disease CKD  3  Significant Hospital Events:  Admission on 05-14-2020  Consults:  PCCM consult on 05-17-2020  Procedures:  Not applicable  Significant Diagnostic Tests:  Not applicable  Micro Data:  All blood cultures have been negative. No sputum cultures. Influenza and Covid 2 -  Antimicrobials:  Vancomycin and cefepime initiated on day 1 of admission and have been discontinued since  Interim History / Subjective:  Not applicable  Objective   Blood pressure (!) 153/66, pulse 71, temperature 98.6 F (37 C), temperature source Oral, resp. rate 20, height 6' (1.829 m), weight 104.4 kg, SpO2 96 %.        Intake/Output Summary (Last 24 hours) at 05/18/2020 0805 Last data filed at 05/17/2020 2301 Gross per 24 hour  Intake 1200 ml  Output 1950 ml  Net -750 ml   Filed Weights   05/15/20 0542 05/15/20 2014 05/18/20 0406  Weight: 104.1 kg 103.9 kg 104.4 kg    Examination: General: Awake in no distress HENT: Moist mucous membranes Lungs: Faint crackles appreciated bilateral lower lung fields.  No acute distress patient able to speak in full sentences does not appear dyspneic Cardiovascular: Regular rate and rhythm Abdomen: Soft nontender nondistended Extremities: No focal deformities Neuro: No focal deficits cognition appears intact GU: Deferred       6MWT - 02/10/15 - distance: 251m - lowest saturation: 97% - no complaints during walk, no need for supplemental O2 with exertion  6MWT 09/20/2015 - distance 1120ft -  lowest sat  95% - History of peripheral vascular disease and claudication, completed 8.5 minutes of 10 minutes of testing, stopped early due to leg pain  PFTs 02/10/15 postBD FEV1 75% postBD FVC 70% FEV1/FVC 77% RV 147 TLC 126 RV/TLC 110 DLCO - unable toperform Impression - decrease FEV1, preserved FEV1/FVC ratio. Mild/Mod obstruction on inhalers, air trapping/hyperinflation noted.  Significant response to BD, >248ml on FVC and FEV1 post  BD   IMPRESSION: 1. No CT evidence of pulmonary artery embolus. 2. Small bilateral pleural effusions with associated partial compressive atelectasis of the lower lobes. 3. Diffuse interstitial and interlobular septal prominence and diffuse ground-glass opacity throughout the lungs most consistent with edema. Pneumonia is not excluded clinical correlation is recommended. 4. Aortic Atherosclerosis (ICD10-I70.0).   Electronically Signed   By: Elgie Collard M.D.   On: 05/14/2020 16:16   Have reviewed CT chest from 05/14/2020 personally:   Most pertinent for groundglass opacities inter lobular septal thickening and bilateral pleural effusions. no focal infiltrates. no PE.     Resolved Hospital Problem list   Not applicable  Assessment & Plan:  This is a 79 year old with history as noted above for whom pulmonary is consulted for optimization of COPD regimen in the context of hypoxic respiratory failure.  Acute hypoxic respiratory failure-likely multifactorial patient does note that he had significant swelling on admission that has improved since then.  Patient on 5 L currently was on 8 L previously.  Patient with some symptoms of an exacerbation with wheeze.  Although does not have the classic increase sputum production or purulence. -Continue to treat as an exacerbation with prednisone 40 mg for 5 days -Would also entertain 5-day course of antibiotics.  As FEV1 relatively preserved do not think we need pseudomonal coverage at this point think it is okay to do 5-day course of azithromycin. -Would recommend Keeping  Patient at least  net -1 L as much of this is likely related to heart failure -Also should follow-up with pulmonary physician may benefit from Roflumilast versus daily azithromycin for recurrent COPD exacerbations in the context of optimization of current therapy. -No previous sputum cultures to go by -Obtain sputum cultures if able -Ensure patient is receiving CPAP  while here for sleep apnea -Agree with LABA ICS combo and LAMA as currently receiving -Agree with incentive spirometry. -Agree with albuterol as needed -Can consider CT PE vs without contrast for further workup but doubt this will be of high yield. - Additionally his goal saturation should be 88% and above with his underlying illness. -No further recommendations as of 05-18-2020 -Pulmonary will sign off for now      Labs   CBC: Recent Labs  Lab 05/14/20 1358 05/15/20 1021 05/16/20 0233 05/17/20 0009 05/17/20 0751 05/18/20 0620  WBC 15.3* 12.1* 15.5* 12.8* 11.8* 11.4*  NEUTROABS 13.2*  --   --   --   --   --   HGB 11.1* 10.1* 10.2* 9.8* 10.3* 9.7*  HCT 35.3* 32.3* 32.2* 31.0* 31.6* 30.9*  MCV 86.9 87.5 87.0 87.3 86.6 86.3  PLT 167 153 154 147* 151 148*    Basic Metabolic Panel: Recent Labs  Lab 05/14/20 1358 05/15/20 0455 05/16/20 0233 05/17/20 0009 05/18/20 0620  NA 135 137 136 135 136  K 4.0 4.0 3.5 3.5 3.9  CL 94* 96* 96* 95* 97*  CO2 31 33* 34* 32 30  GLUCOSE 420* 474* 173* 397* 402*  BUN 37* 37* 35* 36* 40*  CREATININE 1.83* 1.91* 1.62* 1.78* 1.48*  CALCIUM 8.3* 8.2* 8.0* 7.7* 8.4*   GFR: Estimated Creatinine Clearance: 51.4 mL/min (A) (by C-G formula based on SCr of 1.48 mg/dL (H)). Recent Labs  Lab 05/14/20 1358 05/15/20 0455 05/15/20 1021 05/16/20 0233 05/17/20 0009 05/17/20 0751 05/18/20 0620  PROCALCITON <0.10 <0.10  --  <0.10  --   --   --   WBC 15.3*  --    < > 15.5* 12.8* 11.8* 11.4*  LATICACIDVEN 1.4  --   --   --   --   --   --    < > = values in this interval not displayed.    Liver Function Tests: Recent Labs  Lab 05/14/20 1358  AST 29  ALT 35  ALKPHOS 109  BILITOT 1.1  PROT 6.2*  ALBUMIN 3.2*   No results for input(s): LIPASE, AMYLASE in the last 168 hours. No results for input(s): AMMONIA in the last 168 hours.  ABG    Component Value Date/Time   HCO3 27.2 01/03/2019 1305   O2SAT 79.7 01/03/2019 1305      Coagulation Profile: Recent Labs  Lab 05/14/20 1358  INR 1.2    Cardiac Enzymes: No results for input(s): CKTOTAL, CKMB, CKMBINDEX, TROPONINI in the last 168 hours.  HbA1C: Hemoglobin A1C  Date/Time Value Ref Range Status  06/23/2014 05:22 AM 10.8 (H) 4.2 - 6.3 % Final    Comment:    The American Diabetes Association recommends that a primary goal of therapy should be <7% and that physicians should reevaluate the treatment regimen in patients with HbA1c values consistently >8%.    Hgb A1c MFr Bld  Date/Time Value Ref Range Status  05/01/2020 06:02 PM 7.9 (H) 4.8 - 5.6 % Final    Comment:    (NOTE) Pre diabetes:          5.7%-6.4%  Diabetes:              >6.4%  Glycemic control for   <7.0% adults with diabetes   09/28/2019 04:29 AM 8.4 (H) 4.8 - 5.6 % Final    Comment:    (NOTE) Pre diabetes:          5.7%-6.4% Diabetes:              >6.4% Glycemic control for   <7.0% adults with diabetes     CBG: Recent Labs  Lab 05/17/20 0739 05/17/20 1152 05/17/20 1602 05/17/20 2154 05/18/20 0803  GLUCAP 258* 358* 116* 319* 419*    Review of Systems:   Pertinent positives and negatives per HPI.  The remainder of a 12 point review of systems was negative.   Past Medical History:  He,  has a past medical history of Atrial fibrillation ([place]), Basal cell carcinoma (06/2017), CAD (coronary artery disease), CKD (chronic kidney disease), Congestive heart failure ([place]), COPD (chronic obstructive pulmonary disease) ([place]), Diabetes ([place]), Hypertension, OSA on CPAP, and Squamous carcinoma (06/2017).   Surgical History:   Past Surgical History:  Procedure Laterality Date  . CLAVICLE SURGERY    . heart bypass    . HERNIA REPAIR    . MOHS SURGERY  07/10/2017   Head     Social History:   reports that he quit smoking about 18 years ago. His smoking use included cigarettes. He has a 30.00 pack-year smoking history. He has never used smokeless tobacco. He reports that he  does not drink alcohol and does not use drugs.   Family History:  His family history includes Breast cancer in an other  family member; Colon cancer in an other family member; Diabetes in an other family member; Stroke in an other family member.   Allergies Allergies  Allergen Reactions  . Codeine Hives, Rash and Swelling  . Hydralazine Other (See Comments)    tongue swollen and couldn't wake up ---- not positive it was this or a mix of this with something else or high sugar  . Hydrocodone-Acetaminophen Other (See Comments)  . Penicillins Other (See Comments)    Passed out (at 79 yrs old) Has patient had a PCN reaction causing immediate rash, facial/tongue/throat swelling, SOB or lightheadedness with hypotension: No Has patient had a PCN reaction causing severe rash involving mucus membranes or skin necrosis: No Has patient had a PCN reaction that required hospitalization No Has patient had a PCN reaction occurring within the last 10 years: No If all of the above answers are "NO", then may proceed with Cephalosporin use.      Home Medications  Prior to Admission medications   Medication Sig Start Date End Date Taking? Authorizing Provider  amLODipine (NORVASC) 5 MG tablet Take 5 mg by mouth daily.  08/17/14  Yes [provider]  apixaban (ELIQUIS) 2.5 MG TABS tablet Take 2.5 mg by mouth 2 (two) times daily.   Yes [provider]  aspirin EC 81 MG tablet Take 81 mg by mouth daily. 08/17/14  Yes [provider]  atorvastatin (LIPITOR) 20 MG tablet Take 20 mg by mouth daily. 10/08/19  Yes [provider]  azithromycin (ZITHROMAX) 250 MG tablet Take 250 mg by mouth daily. 05/13/20  Yes [provider]  citalopram (CELEXA) 20 MG tablet Take 20 mg by mouth at bedtime.  07/30/14  Yes [provider]  CRANBERRY PO Take 820 mg by mouth daily.   Yes [provider]  cyanocobalamin 500 MCG tablet Take 500 mcg by mouth daily.   Yes  [provider]  diazepam (VALIUM) 5 MG tablet Take 5 mg by mouth every 12 (twelve) hours as needed for anxiety.  10/24/13  Yes [provider]  Fluticasone-Salmeterol (WIXELA INHUB) 500-50 MCG/DOSE AEPB Inhale 1 puff into the lungs 2 (two) times daily. 01/12/20  Yes Flora Lipps, MD  furosemide (LASIX) 20 MG tablet Take 20 mg by mouth daily.    Yes [provider]  HUMALOG 100 UNIT/ML injection Inject 10-25 Units into the skin 3 (three) times daily with meals. (sliding scale as needed to a maximum of 150u daily) 06/24/19  Yes [provider]  insulin glargine (LANTUS) 100 UNIT/ML injection Inject 60 Units into the skin at bedtime.    Yes [provider]  lisinopril (ZESTRIL) 10 MG tablet Take 10 mg by mouth 2 (two) times daily.   Yes [provider]  metFORMIN (GLUCOPHAGE-XR) 500 MG 24 hr tablet Take 1,000 mg by mouth daily with supper.   Yes [provider]  metoprolol tartrate (LOPRESSOR) 50 MG tablet Take 50 mg by mouth 2 (two) times daily. 03/09/20  Yes [provider]  OMEGA-3 FATTY ACIDS-VITAMIN E PO Take 1 capsule by mouth daily.   Yes [provider]  omeprazole (PRILOSEC) 20 MG capsule Take 20 mg by mouth daily.   Yes [provider]  pregabalin (LYRICA) 25 MG capsule Take 25 mg by mouth 2 (two) times daily.   Yes [provider]  tiotropium (SPIRIVA) 18 MCG inhalation capsule Place 18 mcg into inhaler and inhale daily. 07/31/14  Yes [provider]  vitamin C (ASCORBIC ACID)  500 MG tablet Take 500 mg by mouth daily.   Yes [provider]  albuterol (VENTOLIN HFA) 108 (90 Base) MCG/ACT inhaler INHALE 2 PUFFS INTO THE LUNGS EVERY 4 HOURS AS NEEDED FOR WHEEZING OR SHORTNESS OF BREATH 04/18/20   Flora Lipps, MD  BD INSULIN SYRINGE U/F 31G X 5/16" 0.5 ML MISC  10/10/19   [provider]  ergocalciferol (VITAMIN D2) 50000 units capsule Take 50,000 Units by mouth every 30  (thirty) days.    [provider]     Critical care time: 35 minutes

## 2020-05-18 NOTE — Progress Notes (Signed)
1        [place] [facility]   PATIENT NAME: Jacquelynn CreeLarry Rossi    MR#:  [id number]  DATE OF BIRTH:  09-11-1940  SUBJECTIVE:  CHIEF COMPLAINT:   Chief Complaint  Patient presents with  . Headache  Feeling somewhat better today. Agreeable to meet with palliative care. REVIEW OF SYSTEMS:  Review of Systems  Constitutional: Negative for diaphoresis, fever, malaise/fatigue and weight loss.  HENT: Negative for ear discharge, ear pain, hearing loss, nosebleeds, sore throat and tinnitus.   Eyes: Negative for blurred vision and pain.  Respiratory: Positive for shortness of breath. Negative for cough, hemoptysis and wheezing.   Cardiovascular: Negative for chest pain, palpitations, orthopnea and leg swelling.  Gastrointestinal: Negative for abdominal pain, blood in stool, constipation, diarrhea, heartburn, nausea and vomiting.  Genitourinary: Negative for dysuria, frequency and urgency.  Musculoskeletal: Negative for back pain and myalgias.  Skin: Negative for itching and rash.  Neurological: Positive for headaches. Negative for dizziness, tingling, tremors, focal weakness, seizures and weakness.  Psychiatric/Behavioral: Negative for depression. The patient is not nervous/anxious.    DRUG ALLERGIES:   Allergies  Allergen Reactions  . Codeine Hives, Rash and Swelling  . Hydralazine Other (See Comments)    tongue swollen and couldn't wake up ---- not positive it was this or a mix of this with something else or high sugar  . Hydrocodone-Acetaminophen Other (See Comments)  . Penicillins Other (See Comments)    Passed out (at 79 yrs old) Has patient had a PCN reaction causing immediate rash, facial/tongue/throat swelling, SOB or lightheadedness with hypotension: No Has patient had a PCN reaction causing severe rash involving mucus membranes or skin necrosis: No Has patient had a PCN reaction that required hospitalization No Has patient had a PCN reaction occurring within the last 10  years: No If all of the above answers are "NO", then may proceed with Cephalosporin use.    VITALS:  Blood pressure (!) 159/58, pulse 65, temperature 98.4 F (36.9 C), temperature source Oral, resp. rate 17, height 6' (1.829 m), weight 104.4 kg, SpO2 92 %. PHYSICAL EXAMINATION:  Physical Exam Constitutional:      Appearance: He is obese.  HENT:     Head: Normocephalic and atraumatic.  Eyes:     Conjunctiva/sclera: Conjunctivae normal.     Pupils: Pupils are equal, round, and reactive to light.  Neck:     Thyroid: No thyromegaly.     Trachea: No tracheal deviation.  Cardiovascular:     Rate and Rhythm: Normal rate and regular rhythm.     Heart sounds: Normal heart sounds.  Pulmonary:     Effort: Pulmonary effort is normal. No respiratory distress.     Breath sounds: Examination of the right-lower field reveals decreased breath sounds. Examination of the left-lower field reveals decreased breath sounds. Decreased breath sounds and wheezing present.  Chest:     Chest wall: No tenderness.  Abdominal:     General: Bowel sounds are normal. There is no distension.     Palpations: Abdomen is soft.     Tenderness: There is no abdominal tenderness.  Musculoskeletal:        General: Normal range of motion.     Cervical back: Normal range of motion and neck supple.  Skin:    General: Skin is warm and dry.     Findings: No rash.  Neurological:     Mental Status: He is alert and oriented to person, place, and time.  Cranial Nerves: No cranial nerve deficit.    LABORATORY PANEL:  Male CBC Recent Labs  Lab 05/18/20 0620  WBC 11.4*  HGB 9.7*  HCT 30.9*  PLT 148*   ------------------------------------------------------------------------------------------------------------------ Chemistries  Recent Labs  Lab 05/14/20 1358 05/15/20 0455 05/18/20 0620  NA 135   < > 136  K 4.0   < > 3.9  CL 94*   < > 97*  CO2 31   < > 30  GLUCOSE 420*   < > 402*  BUN 37*   < > 40*   CREATININE 1.83*   < > 1.48*  CALCIUM 8.3*   < > 8.4*  AST 29  --   --   ALT 35  --   --   ALKPHOS 109  --   --   BILITOT 1.1  --   --    < > = values in this interval not displayed.   RADIOLOGY:  No results found. ASSESSMENT AND PLAN:  79 year old male with a known history of coronary disease, diastolic CHF, A. fib, CKD, OSA, type 2 diabetes, COPD on 2 L oxygen chronically was admitted for headache and some chest tightness/shortness of breath  Acute on chronic hypoxic respiratory failure Acute on chronic diastolic heart failure He required 4 L oxygen via nasal cannula in the ED - normally he is on 2 L at home -Nursing checked his oxygen qualification.  See their note from today/December 27 - He required 10 L on ambulation and 5 L at rest. While on RA: 85% supine Ambulation on 3L: 65% Ambulation on 4L: 78% Ambulation on 6L: 81%  Ambulation on 10L: >/= 90%    This is likely multifactorial from underlying COPD, heart failure exacerbation, OSA, pulmonary edema/fluid overload -Continue Lasix 40 mg IV twice daily   Elevated troponin due to non-STEMI 186->190->785->616 -Conservative management per Dr. Glennis Brink discussion with patient and spouse Patient has already received 24 hours of heparin. Now on oral Eliquis  CAD: Cardiology Dr. Juliann Pares recommends Plavix and continue Eliquis.  Continue Lipitor and metoprolol  Headache Intermittent.  Symptomatic management for now  Hypertension: Continue amlodipine, metoprolol, lisinopril  A flutter/A. fib: Heart rate controlled on metoprolol, continue Eliquis  Depression/anxiety: Continue citalopram, diazepam  COPD exacerbation: Continue fluticasone-salmeterol, Spiriva, albuterol, added IV Solu-Medrol 60 mg on 12/27  DM: Hold metformin, Increase insulin Lantus to 45 units subcu nightly and NovoLog to 20 units subcu 3 times daily with meals Continue sliding scale insulin  GERD: Continue PPI  Neuropathy: Continue  pregabalin  Constipation He does report 1 bowel movement on 12/26 Continue Senokot and MiraLAX  Obesity Body mass index is 31.22 kg/m.    He remains at high risk for cardiorespiratory failure and death.  Overall poor prognosis,  Appreciate palliative care input. Patient and wife leaning towards hospice but afraid children are not on board yet. Family meeting planned for tomorrow.  Status is: Inpatient  Remains inpatient appropriate because:Inpatient level of care appropriate due to severity of illness   Dispo: The patient is from: Home              Anticipated d/c is to: Home              Anticipated d/c date is: 2 days              Patient currently is not medically stable to d/c.  Plan for hospice if family is all agreeable.    DVT prophylaxis:  Eliquis    Family Communication: ( "discussed with patient").  Updated wife at bedside on 12/27   All the records are reviewed and case discussed with Care Management/Social Worker. Management plans discussed with the patient, nursing, wife and they are in agreement.  CODE STATUS: DNR  TOTAL TIME TAKING CARE OF THIS PATIENT: 35 minutes.   More than 50% of the time was spent in counseling/coordination of care: YES  POSSIBLE D/C IN 2-3 DAYS, DEPENDING ON CLINICAL CONDITION.    Max Sane M.D on 05/18/2020 at 5:27 PM  [facility]   CC: Primary care physician; Juluis Pitch, MD  Note: This dictation was prepared with Dragon dictation along with smaller phrase technology. Any transcriptional errors that result from this process are unintentional.

## 2020-05-18 NOTE — TOC Initial Note (Signed)
Transition of Care [facility]) - Initial/Assessment Note    Patient Details  Name: Ernest Mclean MRN: [id number] Date of Birth: 1940/10/25  Transition of Care [facility]) CM/SW Contact:    Eileen Stanford, LCSW Phone Number: 05/18/2020, 3:57 PM  Clinical Narrative:    Pt is active with [facility]; PT,OT and RN--confirmed with Teresa/[facility]. Pt is agreeable to continue these services. Pt states his wife takes him to his doctor appointments. Pt still sees Dr Lovie Macadamia. Pt gets his meds filled at [facility] in [place]. [facility] notified of admission.               Expected Discharge Plan: [place] Barriers to Discharge: Continued Medical Work up   Patient Goals and CMS Choice Patient states their goals for this hospitalization and ongoing recovery are:: to go home   Choice offered to / list presented to : Patient  Expected Discharge Plan and Services Expected Discharge Plan: [place] In-house Referral: Clinical Social Work   Post Acute Care Choice: [place] arrangements for the past 2 months: [place]: PT,OT,RN [place] [facility]: [facility] [employer] [facility] [employer] Date [place]: 05/18/20 Time [place]: 58 Representative spoke with at [place]: teresa  Prior Living Arrangements/Services Living arrangements for the past 2 months: [place] with:: Spouse Patient language and need for interpreter reviewed:: Yes Do you feel safe going back to the place where you live?: Yes      Need for Family Participation in Patient Care: Yes (Comment) Care giver support system in place?: Yes (comment)   Criminal Activity/Legal Involvement Pertinent to Current Situation/Hospitalization: No - Comment as needed  Activities of Daily Living Home Assistive Devices/Equipment: None ADL Screening (condition at time of admission) Patient's cognitive ability adequate  to safely complete daily activities?: Yes Is the patient deaf or have difficulty hearing?: No Does the patient have difficulty seeing, even when wearing glasses/contacts?: No Does the patient have difficulty concentrating, remembering, or making decisions?: No Patient able to express need for assistance with ADLs?: Yes Does the patient have difficulty dressing or bathing?: No Independently performs ADLs?: Yes (appropriate for developmental age) Does the patient have difficulty walking or climbing stairs?: Yes Weakness of Legs: Both Weakness of Arms/Hands: None  Permission Sought/Granted Permission sought to share information with : Family Supports    Share Information with NAME: ellen     Permission granted to share info w Relationship: spouse     Emotional Assessment Appearance:: Appears stated age Attitude/Demeanor/Rapport: Engaged Affect (typically observed): Accepting,Appropriate Orientation: : Oriented to Self,Oriented to Place,Oriented to  Time,Oriented to Situation Alcohol / Substance Use: Not Applicable Psych Involvement: No (comment)  Admission diagnosis:  Cough [R05.9] Bad headache [R51.9] HCAP (healthcare-associated pneumonia) [J18.9] Sepsis ([place]) [A41.9] Patient Active Problem List   Diagnosis Date Noted  . HCAP (healthcare-associated pneumonia) 05/14/2020  . CHF exacerbation ([place]) 05/01/2020  . Atrial fibrillation, chronic ([place]) 09/27/2019  . Community acquired pneumonia 09/27/2019  . Acute on chronic respiratory failure with hypoxia ([place]) 09/27/2019  . Elevated troponin 09/27/2019  . Acute metabolic encephalopathy [id number]  . Pneumonia 01/03/2019  . 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 Atherosclerosis of native arteries of extremity with intermittent claudication ([place]) 05/08/2017  . Atherosclerosis of native arteries of the extremities  with ulceration (HCC) 03/20/2017  . CKD (chronic kidney disease), stage IIIa 03/20/2017  . Acute on chronic  diastolic CHF (congestive heart failure) (HCC) 08/01/2016  . Anxiety 02/22/2016  . Chronic respiratory failure (HCC) 08/10/2015  . Hypoglycemia 05/12/2015  . Type II diabetes mellitus with renal manifestations (HCC) 05/12/2015  . HTN (hypertension) 05/12/2015  . CAD (coronary artery disease) 05/12/2015  . Atrial flutter (HCC) 05/12/2015  . GERD (gastroesophageal reflux disease) 05/12/2015  . Solitary pulmonary nodule 02/10/2015  . OSA on CPAP 08/17/2014  . COPD with acute exacerbation (HCC) 08/17/2014   PCP:  Dorothey Baseman, MD Pharmacy:   [id number] - [place], [place] - [address] AT [facility] [address] [place] [address] Phone: [phone] Fax: [phone]     Social Determinants of Health (SDOH) Interventions    Readmission Risk Interventions Readmission Risk Prevention Plan 05/18/2020 05/16/2020  Transportation Screening Complete Complete  PCP or Specialist Appt within 3-5 Days Complete Complete  HRI or Home Care Consult Complete Complete  Social Work Consult for Recovery Care Planning/Counseling Complete Complete  Palliative Care Screening Complete Not Applicable  Medication Review Oceanographer) Complete Complete  Some recent data might be hidden

## 2020-05-19 DIAGNOSIS — R0902 Hypoxemia: Secondary | ICD-10-CM

## 2020-05-19 DIAGNOSIS — I2510 Atherosclerotic heart disease of native coronary artery without angina pectoris: Secondary | ICD-10-CM | POA: Diagnosis not present

## 2020-05-19 DIAGNOSIS — I482 Chronic atrial fibrillation, unspecified: Secondary | ICD-10-CM | POA: Diagnosis not present

## 2020-05-19 DIAGNOSIS — Z66 Do not resuscitate: Secondary | ICD-10-CM | POA: Diagnosis not present

## 2020-05-19 DIAGNOSIS — N1831 Chronic kidney disease, stage 3a: Secondary | ICD-10-CM | POA: Diagnosis not present

## 2020-05-19 DIAGNOSIS — E1122 Type 2 diabetes mellitus with diabetic chronic kidney disease: Secondary | ICD-10-CM | POA: Diagnosis not present

## 2020-05-19 DIAGNOSIS — I5033 Acute on chronic diastolic (congestive) heart failure: Secondary | ICD-10-CM | POA: Diagnosis not present

## 2020-05-19 LAB — CULTURE, BLOOD (ROUTINE X 2)
Culture: NO GROWTH
Culture: NO GROWTH

## 2020-05-19 LAB — GLUCOSE, CAPILLARY
Glucose-Capillary: 198 mg/dL — ABNORMAL HIGH (ref 70–99)
Glucose-Capillary: 238 mg/dL — ABNORMAL HIGH (ref 70–99)
Glucose-Capillary: 272 mg/dL — ABNORMAL HIGH (ref 70–99)
Glucose-Capillary: 295 mg/dL — ABNORMAL HIGH (ref 70–99)
Glucose-Capillary: 307 mg/dL — ABNORMAL HIGH (ref 70–99)

## 2020-05-19 LAB — BASIC METABOLIC PANEL
Anion gap: 10 (ref 5–15)
BUN: 42 mg/dL — ABNORMAL HIGH (ref 8–23)
CO2: 32 mmol/L (ref 22–32)
Calcium: 9 mg/dL (ref 8.9–10.3)
Chloride: 97 mmol/L — ABNORMAL LOW (ref 98–111)
Creatinine, Ser: 1.41 mg/dL — ABNORMAL HIGH (ref 0.61–1.24)
GFR, Estimated: 51 mL/min — ABNORMAL LOW (ref >60)
Glucose, Bld: 232 mg/dL — ABNORMAL HIGH (ref 70–99)
Potassium: 3.7 mmol/L (ref 3.5–5.1)
Sodium: 139 mmol/L (ref 135–145)

## 2020-05-19 MED ORDER — AMLODIPINE BESYLATE 10 MG PO TABS
10.0000 mg | ORAL_TABLET | Freq: Every day | ORAL | Status: DC
  Administered 2020-05-20: 11:00:00 10 mg via ORAL
  Filled 2020-05-19: qty 1

## 2020-05-19 MED ORDER — PREDNISONE 20 MG PO TABS
50.0000 mg | ORAL_TABLET | Freq: Every day | ORAL | Status: DC
  Administered 2020-05-20: 08:00:00 50 mg via ORAL
  Filled 2020-05-19: qty 3

## 2020-05-19 MED ORDER — INSULIN GLARGINE 100 UNIT/ML ~~LOC~~ SOLN
20.0000 [IU] | Freq: Every day | SUBCUTANEOUS | Status: DC
  Administered 2020-05-20: 11:00:00 20 [IU] via SUBCUTANEOUS
  Filled 2020-05-19 (×3): qty 0.2

## 2020-05-19 NOTE — Progress Notes (Signed)
Mobility Specialist - Progress Note   05/19/20 1138  Mobility  Activity Ambulated in hall  Level of Assistance Standby assist, set-up cues, supervision of patient - no hands on  Assistive Device Front wheel walker  Distance Ambulated (ft) 325 ft  Mobility Response Tolerated well  Mobility performed by Mobility specialist  $Mobility charge 1 Mobility    Pre-mobility: 75 HR, 91% SpO2 During mobility: 78 HR, 86% SpO2 Post-mobility: 67 HR, 98% SpO2   Pt was lying in bed upon arrival utilizing 5L [place] O2. Wife present at bedside. Pt agreed to session denying pain, nausea, and fatigue. Pt's [place] was applied to portable O2 tank, set on 4L. Pt was able to get EOB and stand with supervision and incremental movements for energy conservation. Pt denied dizziness upon standing. Pt proceeded to ambulate 30' into hallway, O2 desat to 78%. A standing rest break was taken. No s/s of distress as pt denies SOB. O2 bumped up to 6L increasing sats to 82% but unchanging. Mobility increased O2 to 8L and oxygen sat up to 93%. Pt continued remainder of activity on 8L with saturations mostly in mid-high 90s. O2 sats occasionally dropping to 86% every ~100'. Two additional standing rest breaks utilized for energy conservation. O2 sats up quickly with rest and PLB. Pt continued to deny weakness, dizziness, or SOB throughout session. No LOB noted. Upon return to room, pt was able to return supine with supervision. Overall, pt tolerated session well. Pt was left in bed on 5L with all needs in reach and alarm set. Nurse notified of performance.    Ernest Mclean Mobility Specialist 05/19/20, 11:55 AM

## 2020-05-19 NOTE — TOC Progression Note (Addendum)
Transition of Care [facility]) - Progression Note    Patient Details  Name: Ernest Mclean MRN: [id number] Date of Birth: 1940/09/16  Transition of Care [facility]) CM/SW Contact  Maree Krabbe, LCSW Phone Number: 05/19/2020, 3:13 PM  Clinical Narrative: Per MD plan is for pt to d/c home tomorrow with hospice. Equipment has been ordered via [facility]. Bed should be delivered tomorrow.  Per Clydie Braun with hospice, pt will transport home via family car.   Expected Discharge Plan: Home w Home Health Services Barriers to Discharge: Continued Medical Work up  Expected Discharge Plan and Services Expected Discharge Plan: Home w Home Health Services In-house Referral: Clinical Social Work   Post Acute Care Choice: Home Health Living arrangements for the past 2 months: Single Family Home                           HH Arranged: PT,OT,RN HH [facility] [employer] Date HH Agency Contacted: 05/18/20 Time HH Agency Contacted: 1556 Representative spoke with at [facility] Agency: teresa   Social Determinants of Health (SDOH) Interventions    Readmission Risk Interventions Readmission Risk Prevention Plan 05/18/2020 05/16/2020  Transportation Screening Complete Complete  PCP or Specialist Appt within 3-5 Days Complete Complete  HRI or Home Care Consult Complete Complete  Social Work Consult for Recovery Care Planning/Counseling Complete Complete  Palliative Care Screening Complete Not Applicable  Medication Review Oceanographer) Complete Complete  Some recent data might be hidden

## 2020-05-19 NOTE — Progress Notes (Signed)
[employer] at [place] NAME: Ernest Mclean    MR#:  [id number]  DATE OF BIRTH:  1940/09/09  SUBJECTIVE:  patient feels little better.Pt feels he is improving slowly. Good UOP Some dry cough Wife in the room Palliative care , pt , wife met with his children to discuss [place] today REVIEW OF SYSTEMS:   Review of Systems  Constitutional: Negative for chills, fever and weight loss.  HENT: Negative for ear discharge, ear pain and nosebleeds.   Eyes: Negative for blurred vision, pain and discharge.  Respiratory: Positive for cough and shortness of breath. Negative for sputum production, wheezing and stridor.   Cardiovascular: Negative for chest pain, palpitations, orthopnea and PND.  Gastrointestinal: Negative for abdominal pain, diarrhea, nausea and vomiting.  Genitourinary: Negative for frequency and urgency.  Musculoskeletal: Negative for back pain and joint pain.  Neurological: Positive for weakness. Negative for sensory change, speech change and focal weakness.  Psychiatric/Behavioral: Negative for depression and hallucinations. The patient is not nervous/anxious.    Tolerating Diet:yes Tolerating PT: ambulates by self  DRUG ALLERGIES:   Allergies  Allergen Reactions  . Codeine Hives, Rash and Swelling  . Hydralazine Other (See Comments)    tongue swollen and couldn't wake up ---- not positive it was this or a mix of this with something else or high sugar  . Hydrocodone-Acetaminophen Other (See Comments)  . Penicillins Other (See Comments)    Passed out (at 79 yrs old) Has patient had a PCN reaction causing immediate rash, facial/tongue/throat swelling, SOB or lightheadedness with hypotension: No Has patient had a PCN reaction causing severe rash involving mucus membranes or skin necrosis: No Has patient had a PCN reaction that required hospitalization No Has patient had a PCN reaction occurring within the last 10 years: No If all of the  above answers are "NO", then may proceed with Cephalosporin use.     VITALS:  Blood pressure (!) 173/84, pulse 67, temperature 97.7 F (36.5 C), temperature source Oral, resp. rate 19, height 6' (1.829 m), weight 104.4 kg, SpO2 99 %.  PHYSICAL EXAMINATION:   Physical Exam  GENERAL:  79 y.o.-year-old patient lying in the bed with no acute distress. Looks chronically ill HEENT: Head atraumatic, normocephalic. Oropharynx and nasopharynx clear.  LUNGS:decreased breath sounds bilaterally, no wheezing, rales, rhonchi. No use of accessory muscles of respiration.  CARDIOVASCULAR: S1, S2 normal. No murmurs, rubs, or gallops.  ABDOMEN: Soft, nontender, nondistended. Bowel sounds present. No organomegaly or mass.  EXTREMITIES: No cyanosis, clubbing or edema b/l.    NEUROLOGIC: Cranial nerves II through XII are intact. No focal Motor or sensory deficits b/l.   PSYCHIATRIC:  patient is alert and oriented x 3.  SKIN: No obvious rash, lesion, or ulcer.   LABORATORY PANEL:  CBC Recent Labs  Lab 05/18/20 0620  WBC 11.4*  HGB 9.7*  HCT 30.9*  PLT 148*    Chemistries  Recent Labs  Lab 05/14/20 1358 05/15/20 0455 05/19/20 0601  NA 135   < > 139  K 4.0   < > 3.7  CL 94*   < > 97*  CO2 31   < > 32  GLUCOSE 420*   < > 232*  BUN 37*   < > 42*  CREATININE 1.83*   < > 1.41*  CALCIUM 8.3*   < > 9.0  AST 29  --   --   ALT 35  --   --   ALKPHOS 109  --   --  BILITOT 1.1  --   --    < > = values in this interval not displayed.   Cardiac Enzymes No results for input(s): TROPONINI in the last 168 hours. RADIOLOGY:  No results found. ASSESSMENT AND PLAN:   79 year old male with a known history of coronary disease, diastolic CHF, A. fib, CKD, OSA, type 2 diabetes, COPD on 2 L oxygen chronically was admitted for headache and some chest tightness/shortness of breath  Acute on chronic hypoxic respiratory failure Acute on chronic diastolic heart failure -patient currently on 5 L nasal  cannula oxygen. Sats are 94 99%.  -This is likely multifactorial from underlying COPD, heart failure exacerbation, OSA, pulmonary edema/fluid overload -Continue Lasix 40 mg IV twice daily -- change to oral from tomorrow. Good urine output for 5.6 Liters so far. Overall overall patient feels he is improving slowly.  Elevated troponin due to non-STEMI 186->190->785->616 -Conservative management per Dr. Etta Quill discussion with patient and spouse -Patient has already received 24 hours of heparin. Now on oral Eliquis  CAD: Cardiology Dr. Clayborn Bigness recommends Plavix and continue Eliquis.  -Continue Lipitor and metoprolol  Hypertension: Continue amlodipine, metoprolol, lisinopril  A flutter/A. fib: Heart rate controlled on metoprolol, continue Eliquis  Depression/anxiety: Continue citalopram, diazepam  COPD exacerbation: Continue fluticasone-salmeterol, Spiriva, albuterol, added IV Solu-Medrol 60 mg on 12/27  DM-2, hyperglycemia/uncontrolled/on steroids, peripheral neuropathy -Hold metformin -Increase insulin Lantus to 45 units subcu nightly and NovoLog to 20 units subcu 3 times daily with meals -ontinue sliding scale insulin -- patient will be discharged on his home insulin regimen. He and his wife understands once he is off steroids his sugars should be improving.  GERD: Continue PPI  peripheral neuropathy:  -Continue pregabalin  Constipation He does report 1 bowel movement on 12/26 Continue Senokot and MiraLAX  Obesity Body mass index is 31.22 kg/m.  He remains at high risk for cardiorespiratory failure and death.  Overall poor prognosis,   12/29--Appreciate palliative care input. Patient and wife agreeable for hospice at home. Family meeting was held by palliative care with patient wife and three children today   status is: Inpatient  Remains inpatient appropriate because:Inpatient level of care appropriate due to severity of illness   Dispo: The patient is  from: Home  Anticipated d/c is to: Home with hospice tomorrow  Anticipated d/c date is: 2 days   patient currently is medically improving slowly. Will diaries one more day with IV Lasix. If continues to show improvement discharged tomorrow home with hospice. Patient and wife agreeable.         TOTAL TIME TAKING CARE OF THIS PATIENT: *25* minutes.  >50% time spent on counselling and coordination of care  Note: This dictation was prepared with Dragon dictation along with smaller phrase technology. Any transcriptional errors that result from this process are unintentional.  Fritzi Mandes M.D    [facility]   CC: Primary care physician; Juluis Pitch, MDPatient ID: Ernest Mclean, male   DOB: 01/01/1941, 79 y.o.   MRN: [id number]

## 2020-05-19 NOTE — Care Management Important Message (Signed)
Important Message  Patient Details  Name: Ernest Mclean MRN: [id number] Date of Birth: [birth year]   Medicare Important Message Given:  Yes     [name] 05/19/2020, 10:56 AM

## 2020-05-19 NOTE — Progress Notes (Signed)
Inpatient Diabetes Program Recommendations  AACE/ADA: New Consensus Statement on Inpatient Glycemic Control   Target Ranges:  Prepandial:   less than 140 mg/dL      Peak postprandial:   less than 180 mg/dL (1-2 hours)      Critically ill patients:  140 - 180 mg/dL  Results for Both, BASSEM BERNASCONI (MRN [id number]) as of 05/19/2020 08:04  Ref. Range 05/19/2020 06:01  Glucose Latest Ref Range: 70 - 99 mg/dL 786 (H)   Results for Seiter, ZACHERIAH STUMPE (MRN [id number]) as of 05/19/2020 06:16  Ref. Range 05/18/2020 08:03 05/18/2020 11:44 05/18/20 16:06 05/18/2020 17:23 05/18/2020 21:47 05/19/2020 00:45  Glucose-Capillary Latest Ref Range: 70 - 99 mg/dL 962 (H)  Novolog 15 units @ 8:51  Novolog 15 units @ 9:00 510 (HH)  Novolog 40 units @13 :30      Lantus 10 units 357 (H)  Novolog 40 units@17 :43 250 (H)  Novolog 2 units@21 :58  Lantus 45 units @21 :57 198 (H)    Review of Glycemic Control  Diabetes history: DM2 Outpatient Diabetes medications: Lantus 60 units QHS, Humalog 10-25 units TID, Metformin 1000 mg QPM Current orders for Inpatient glycemic control: Lantus 45 units QHS, Novolog 20 units TID with meals, Novolog 0-20 units TID with meals, Novolog 0-5 units QHS; Solumedrol 60 mg Q24H  Inpatient Diabetes Program Recommendations:    Insulin: If steroids will be continued as ordered, please consider ordering Lantus 20 units QAM (continue Lantus 45 units QHS).  Thanks, , RN, MSN, CDE Diabetes Coordinator Inpatient Diabetes Program [phone] (Team Pager from 8am to 5pm)

## 2020-05-19 NOTE — Progress Notes (Signed)
Patient ID: Lottie Rater, male   DOB: [birth year], 79 y.o.   MRN: [id number]  This NP visited patient at the bedside as a follow up to  yesterday's GOCs meeting and to have scheduled family meeting for continued education regarding diagnosis,  prognosis, GOCs, EOL wishes, disposition and options.   Wife at bedside, patient's three children; Lind Guest and Lorin Picket participated in conversation via conference call.  Patient was able to clearly articulate the seriousness of his medical situation to his children.  All three children verbalized a knowing and understanding of the patient's continued physical decline and the likely long-term poor prognosis.  Patient, supported by his wife, outlined his goals of care to his family.  Education offered on the difference between an aggressive medical intervention path and a palliative comfort path for this patient at this time in this situation.  Education offered on hospice services/benefit in the home.  Plan of Care: -DNR/DNI -avoid re-hospitalizations -hospice services in the home with  prognosis of less than six months when stable for discharge -Comfort and quality are main focus of care.  MOST form completed  Discussed with patient the importance of continued conversation with his  family and the medical providers regarding overall plan of care and treatment options,  ensuring decisions are within the context of the patients values and GOCs.  Questions and concerns addressed   Discussed with Dr Allena Katz via secure chat  Total time spent on the unit was 60 minutes  Greater than 50% of the time was spent in counseling and coordination of care  Lorinda Creed NP  Palliative Medicine Team Team Phone # [phone] Pager [phone]

## 2020-05-19 NOTE — Progress Notes (Signed)
[facility] Liaison note:  New referral for [facility] hospice services at home received from Palliative NP [name], [facility] Loletha Grayer aware.  Patient information sent to referral. Hospice eligibility has been confirmed.  Writer met in the room with patient and his wife Ernest Mclean to initiate education regarding hospice services, philosophy and team approach to care with understanding voiced. DME needs discussed and the following have been ordered: hospital bed, over bed table, oxygen 10 liter concentrator, portable oxygen and a rollator walker. Ernest Mclean requests delivery tomorrow afternoon as she needs to rearrange and move furniture prior to delivery. Hospice information and contact numbers given to [facility]. Liaison to follow through discharge. Hospital care team updated. Thank you for the opportunity to be involved in the care of this patient and his family.  Flo Shanks BSN, RN, [place] [phone]

## 2020-05-20 DIAGNOSIS — I2510 Atherosclerotic heart disease of native coronary artery without angina pectoris: Secondary | ICD-10-CM | POA: Diagnosis not present

## 2020-05-20 DIAGNOSIS — I4892 Unspecified atrial flutter: Secondary | ICD-10-CM

## 2020-05-20 DIAGNOSIS — I5033 Acute on chronic diastolic (congestive) heart failure: Secondary | ICD-10-CM | POA: Diagnosis not present

## 2020-05-20 DIAGNOSIS — I482 Chronic atrial fibrillation, unspecified: Secondary | ICD-10-CM | POA: Diagnosis not present

## 2020-05-20 LAB — GLUCOSE, CAPILLARY
Glucose-Capillary: 199 mg/dL — ABNORMAL HIGH (ref 70–99)
Glucose-Capillary: 207 mg/dL — ABNORMAL HIGH (ref 70–99)
Glucose-Capillary: 373 mg/dL — ABNORMAL HIGH (ref 70–99)

## 2020-05-20 MED ORDER — PREDNISONE 10 MG PO TABS: 50.0000 mg | ORAL_TABLET | Freq: Every day | ORAL | 0 refills | Status: AC

## 2020-05-20 MED ORDER — FUROSEMIDE 40 MG PO TABS: 40.0000 mg | ORAL_TABLET | Freq: Two times a day (BID) | ORAL | 0 refills | Status: AC

## 2020-05-20 MED ORDER — FUROSEMIDE 10 MG/ML IJ SOLN
40.0000 mg | Freq: Once | INTRAMUSCULAR | Status: AC
  Administered 2020-05-20: 08:00:00 40 mg via INTRAVENOUS
  Filled 2020-05-20: qty 4

## 2020-05-20 MED ORDER — GUAIFENESIN-DM 100-10 MG/5ML PO SYRP: 5.0000 mL | ORAL_SOLUTION | ORAL | 0 refills | Status: AC | PRN

## 2020-05-20 MED ORDER — FUROSEMIDE 40 MG PO TABS: 40.0000 mg | ORAL_TABLET | Freq: Two times a day (BID) | ORAL | Status: DC

## 2020-05-20 MED ORDER — AMLODIPINE BESYLATE 10 MG PO TABS: 5.0000 mg | ORAL_TABLET | Freq: Every day | ORAL | 1 refills | Status: AC

## 2020-05-20 MED ORDER — CLOPIDOGREL BISULFATE 75 MG PO TABS: 75.0000 mg | ORAL_TABLET | Freq: Every day | ORAL | 0 refills | Status: AC

## 2020-05-20 NOTE — Discharge Instructions (Signed)
Use your oxygen, inhalers as before Check your sugars and use your Insulin regimen as before. Review your sugars with PCP to adjust any insulin

## 2020-05-20 NOTE — Discharge Summary (Addendum)
[facility]   PATIENT NAME: Ernest Mclean    MR#:  [id number]  DATE OF BIRTH:  08-Jul-1940  DATE OF ADMISSION:  05/14/2020 ADMITTING PHYSICIAN: Venora Maples, MD  DATE OF DISCHARGE: 05/20/2020  PRIMARY CARE PHYSICIAN: Dorothey Baseman, MD    ADMISSION DIAGNOSIS:  Cough [R05.9] Bad headache [R51.9] HCAP (healthcare-associated pneumonia) [J18.9] Sepsis (HCC) [A41.9]  DISCHARGE DIAGNOSIS:  Acute on chronic hypoxic respiratory failure secondary to acute on chronic diastolic heart failure Non-STEMI-- medical management A fib/chronic anticoagulation  SECONDARY DIAGNOSIS:   Past Medical History:  Diagnosis Date  . Atrial fibrillation (HCC)   . Basal cell carcinoma 06/2017   Left Ear  . CAD (coronary artery disease)   . CKD (chronic kidney disease)   . Congestive heart failure (HCC)   . COPD (chronic obstructive pulmonary disease) (HCC)   . Diabetes (HCC)   . Hypertension   . OSA on CPAP   . 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 fib, CKD, OSA, type 2 diabetes, COPD on 2 L oxygen chronically was admitted for headache and some chest tightness/shortness of breath  Acute on chronic hypoxic respiratory failure Acute on chronic diastolic heart failure -patient currently on 5 L nasal cannula oxygen. Sats are 94-99%.  - likely multifactorial from underlying COPD, heart failure exacerbation, OSA, pulmonary edema/fluid overload -Continue Lasix40 mg IV twice daily -- change to oral from today. Good urine output for 5.6 Liters so far. Overall overall patient feels he is improving slowly. --lasix 40 mg po bid x7 days--->40 mg daily  -Sepsis ruled out.   Elevated troponin due to non-STEMI 186->190->785->616 -Conservative management per Dr. Glennis Brink discussion with patient and spouse -Patient has already received 24 hours of  heparin.Now onoral Eliquis and plavix. ASA d/ced per cardiology recs  CAD: Cardiology Dr. Juliann Pares recommends Plavix  -Continue Lipitor and metoprolol  Hypertension: Continue amlodipine, metoprolol, lisinopril  A flutter/A. fib: Heart rate controlled on metoprolol, continue Eliquis for chronic anticoagulation  Depression/anxiety:On citalopram, diazepam  COPD exacerbation:on fluticasone-salmeterol, Spiriva, albuterol, addedIV Solu-Medrol 60 mg on 12/27--now po taper with oral prednisone  DM-2, hyperglycemia/uncontrolled/on steroids, peripheral neuropathy -Hold metformin -Increase insulin Lantus to45 units subcu nightly and NovoLog to20units subcu 3 times daily with meals -ontinue sliding scale insulin -- patient will be discharged on his home insulin regimen. He and his wife understands once he is off steroids his sugars should be improving.  GERD: Continue PPI  peripheral neuropathy:  -Continue pregabalin  Obesity Body mass index is 31.22 kg/m.  He remains at high risk for cardiorespiratory failure and death. Overall poor prognosis,   12/29--Appreciate palliative care input. Patient and wife agreeable for hospice at home. Family meeting was held by palliative care with patient wife and three children12/29   status is: Inpatient   Dispo: The patient is from: Home Anticipated d/c is to: Home with hospice tomorrow Anticipated d/c date NU:UVOZD  patient currently is medically improving slowly. Patient is agreeable  CONSULTS OBTAINED:  Treatment Team:  Jolyn Nap, MD  DRUG ALLERGIES:   Allergies  Allergen Reactions  . Codeine Hives, Rash and Swelling  . Hydralazine Other (See Comments)    tongue swollen and couldn't wake up ---- not positive it was this or a mix of this with something else or high sugar  . Hydrocodone-Acetaminophen Other (See Comments)  . Penicillins Other (  See Comments)     Passed out (at 79 yrs old) Has patient had a PCN reaction causing immediate rash, facial/tongue/throat swelling, SOB or lightheadedness with hypotension: No Has patient had a PCN reaction causing severe rash involving mucus membranes or skin necrosis: No Has patient had a PCN reaction that required hospitalization No Has patient had a PCN reaction occurring within the last 10 years: No If all of the above answers are "NO", then may proceed with Cephalosporin use.     DISCHARGE MEDICATIONS:   Allergies as of 05/20/2020      Reactions   Codeine Hives, Rash, Swelling   Hydralazine Other (See Comments)   tongue swollen and couldn't wake up ---- not positive it was this or a mix of this with something else or high sugar   Hydrocodone-acetaminophen Other (See Comments)   Penicillins Other (See Comments)   Passed out (at 79 yrs old) Has patient had a PCN reaction causing immediate rash, facial/tongue/throat swelling, SOB or lightheadedness with hypotension: No Has patient had a PCN reaction causing severe rash involving mucus membranes or skin necrosis: No Has patient had a PCN reaction that required hospitalization No Has patient had a PCN reaction occurring within the last 10 years: No If all of the above answers are "NO", then may proceed with Cephalosporin use.      Medication List    STOP taking these medications   aspirin EC 81 MG tablet   azithromycin 250 MG tablet Commonly known as: ZITHROMAX   metFORMIN 500 MG 24 hr tablet Commonly known as: GLUCOPHAGE-XR     TAKE these medications   albuterol 108 (90 Base) MCG/ACT inhaler Commonly known as: VENTOLIN HFA INHALE 2 PUFFS INTO THE LUNGS EVERY 4 HOURS AS NEEDED FOR WHEEZING OR SHORTNESS OF BREATH   amLODipine 10 MG tablet Commonly known as: NORVASC Take 0.5 tablets (5 mg total) by mouth daily. What changed: medication strength   apixaban 2.5 MG Tabs tablet Commonly known as: ELIQUIS Take 2.5 mg by mouth 2 (two) times  daily.   atorvastatin 20 MG tablet Commonly known as: LIPITOR Take 20 mg by mouth daily.   BD Insulin Syringe U/F 31G X 5/16" 0.5 ML Misc Generic drug: Insulin Syringe-Needle U-100   citalopram 20 MG tablet Commonly known as: CELEXA Take 20 mg by mouth at bedtime.   clopidogrel 75 MG tablet Commonly known as: PLAVIX Take 1 tablet (75 mg total) by mouth daily.   CRANBERRY PO Take 820 mg by mouth daily.   diazepam 5 MG tablet Commonly known as: VALIUM Take 5 mg by mouth every 12 (twelve) hours as needed for anxiety.   ergocalciferol 1.25 MG (50000 UT) capsule Commonly known as: VITAMIN D2 Take 50,000 Units by mouth every 30 (thirty) days.   Fluticasone-Salmeterol 500-50 MCG/DOSE Aepb Commonly known as: Wixela Inhub Inhale 1 puff into the lungs 2 (two) times daily.   furosemide 40 MG tablet Commonly known as: LASIX Take 1 tablet (40 mg total) by mouth 2 (two) times daily. for 1 week and then 40 mg daily Start taking on: May 21, 2020 What changed:  medication strength how much to take when to take this additional instructions   guaiFENesin-dextromethorphan 100-10 MG/5ML syrup Commonly known as: ROBITUSSIN DM Take 5 mLs by mouth every 4 (four) hours as needed for cough.   HumaLOG 100 UNIT/ML injection Generic drug: insulin lispro Inject 10-25 Units into the skin 3 (three) times daily with meals. (sliding scale as needed to a maximum  of 150u daily)   insulin glargine 100 UNIT/ML injection Commonly known as: LANTUS Inject 60 Units into the skin at bedtime.   lisinopril 10 MG tablet Commonly known as: ZESTRIL Take 10 mg by mouth 2 (two) times daily.   metoprolol tartrate 50 MG tablet Commonly known as: LOPRESSOR Take 50 mg by mouth 2 (two) times daily.   OMEGA-3 FATTY ACIDS-VITAMIN E PO Take 1 capsule by mouth daily.   omeprazole 20 MG capsule Commonly known as: PRILOSEC Take 20 mg by mouth daily.   predniSONE 10 MG tablet Commonly known as:  DELTASONE Take 5 tablets (50 mg total) by mouth daily with breakfast. Taper by 10 mg daily then stop   pregabalin 25 MG capsule Commonly known as: LYRICA Take 25 mg by mouth 2 (two) times daily.   tiotropium 18 MCG inhalation capsule Commonly known as: SPIRIVA Place 18 mcg into inhaler and inhale daily.   vitamin B-12 500 MCG tablet Commonly known as: CYANOCOBALAMIN Take 500 mcg by mouth daily.   vitamin C 500 MG tablet Commonly known as: ASCORBIC ACID Take 500 mg by mouth daily.       If you experience worsening of your admission symptoms, develop shortness of breath, life threatening emergency, suicidal or homicidal thoughts you must seek medical attention immediately by calling 911 or calling your MD immediately  if symptoms less severe.  You Must read complete instructions/literature along with all the possible adverse reactions/side effects for all the Medicines you take and that have been prescribed to you. Take any new Medicines after you have completely understood and accept all the possible adverse reactions/side effects.   Please note  You were cared for by a hospitalist during your hospital stay. If you have any questions about your discharge medications or the care you received while you were in the hospital after you are discharged, you can call the unit and asked to speak with the hospitalist on call if the hospitalist that took care of you is not available. Once you are discharged, your primary care physician will handle any further medical issues. Please note that NO REFILLS for any discharge medications will be authorized once you are discharged, as it is imperative that you return to your primary care physician (or establish a relationship with a primary care physician if you do not have one) for your aftercare needs so that they can reassess your need for medications and monitor your lab values. Today   SUBJECTIVE  I slept really well. Breathing improving. No  respiratory distress.   VITAL SIGNS:  Blood pressure (!) 161/72, pulse 65, temperature (!) 97.5 F (36.4 C), temperature source Oral, resp. rate 18, height 6' (1.829 m), weight 104.4 kg, SpO2 98 %.  I/O:    Intake/Output Summary (Last 24 hours) at 05/20/2020 0756 Last data filed at 05/19/2020 1900 Gross per 24 hour  Intake 840 ml  Output --  Net 840 ml    PHYSICAL EXAMINATION:  GENERAL:  79 y.o.-year-old patient lying in the bed with no acute distress.  LUNGS: Normal breath sounds bilaterally, no wheezing, rales,rhonchi or crepitation. No use of accessory muscles of respiration.  CARDIOVASCULAR: S1, S2 normal. No murmurs, rubs, or gallops. Irregular rhythm ABDOMEN: Soft, non-tender, non-distended. Bowel sounds present. No organomegaly or mass.  EXTREMITIES: No pedal edema, cyanosis, or clubbing.  NEUROLOGIC: Cranial nerves II through XII are intact. Muscle strength 5/5 in all extremities. Sensation intact. Gait not checked.  PSYCHIATRIC: The patient is alert and oriented x 3.  SKIN: No obvious rash, lesion, or ulcer.   DATA REVIEW:   CBC  Recent Labs  Lab 05/18/20 0620  WBC 11.4*  HGB 9.7*  HCT 30.9*  PLT 148*    Chemistries  Recent Labs  Lab 05/14/20 1358 05/15/20 0455 05/19/20 0601  NA 135   < > 139  K 4.0   < > 3.7  CL 94*   < > 97*  CO2 31   < > 32  GLUCOSE 420*   < > 232*  BUN 37*   < > 42*  CREATININE 1.83*   < > 1.41*  CALCIUM 8.3*   < > 9.0  AST 29  --   --   ALT 35  --   --   ALKPHOS 109  --   --   BILITOT 1.1  --   --    < > = values in this interval not displayed.    Microbiology Results   Recent Results (from the past 240 hour(s))  Resp Panel by RT-PCR (Flu A&B, Covid) Nasopharyngeal Swab     Status: None   Collection Time: 05/14/20  1:54 PM   Specimen: Nasopharyngeal Swab; Nasopharyngeal(NP) swabs in vial transport medium  Result Value Ref Range Status   SARS Coronavirus 2 by RT PCR NEGATIVE NEGATIVE Final    Comment:  (NOTE) SARS-CoV-2 target nucleic acids are NOT DETECTED.  The SARS-CoV-2 RNA is generally detectable in upper respiratory specimens during the acute phase of infection. The lowest concentration of SARS-CoV-2 viral copies this assay can detect is 138 copies/mL. A negative result does not preclude SARS-Cov-2 infection and should not be used as the sole basis for treatment or other patient management decisions. A negative result may occur with  improper specimen collection/handling, submission of specimen other than nasopharyngeal swab, presence of viral mutation(s) within the areas targeted by this assay, and inadequate number of viral copies(<138 copies/mL). A negative result must be combined with clinical observations, patient history, and epidemiological information. The expected result is Negative.  Fact Sheet for Patients:  BloggerCourse.com  Fact Sheet for Healthcare Providers:  SeriousBroker.it  This test is no t yet approved or cleared by the [place] FDA and  has been authorized for detection and/or diagnosis of SARS-CoV-2 by FDA under an Emergency Use Authorization (EUA). This EUA will remain  in effect (meaning this test can be used) for the duration of the COVID-19 declaration under Section 564(b)(1) of the Act, 21 U.S.C.section 360bbb-3(b)(1), unless the authorization is terminated  or revoked sooner.       Influenza A by PCR NEGATIVE NEGATIVE Final   Influenza B by PCR NEGATIVE NEGATIVE Final    Comment: (NOTE) The Xpert Xpress SARS-CoV-2/FLU/RSV plus assay is intended as an aid in the diagnosis of influenza from Nasopharyngeal swab specimens and should not be used as a sole basis for treatment. Nasal washings and aspirates are unacceptable for Xpert Xpress SARS-CoV-2/FLU/RSV testing.  Fact Sheet for Patients: BloggerCourse.com  Fact Sheet for Healthcare  Providers: SeriousBroker.it  This test is not yet approved or cleared by the [place] FDA and has been authorized for detection and/or diagnosis of SARS-CoV-2 by FDA under an Emergency Use Authorization (EUA). This EUA will remain in effect (meaning this test can be used) for the duration of the COVID-19 declaration under Section 564(b)(1) of the Act, 21 U.S.C. section 360bbb-3(b)(1), unless the authorization is terminated or revoked.  Performed at [facility], [address] [place], [place] [address]  Culture, blood (Routine x 2)     Status: None   Collection Time: 05/14/20  1:58 PM   Specimen: BLOOD  Result Value Ref Range Status   Specimen Description BLOOD BLOOD RIGHT FOREARM  Final   Special Requests   Final    BOTTLES DRAWN AEROBIC AND ANAEROBIC Blood Culture results may not be optimal due to an inadequate volume of blood received in culture bottles   Culture   Final    NO GROWTH 5 DAYS Performed at [facility], [address] [place], [place] [address]    Report Status 05/19/2020 FINAL  Final  Culture, blood (Routine x 2)     Status: None   Collection Time: 05/14/20  2:03 PM   Specimen: BLOOD  Result Value Ref Range Status   Specimen Description BLOOD RIGHT ANTECUBITAL  Final   Special Requests   Final    BOTTLES DRAWN AEROBIC AND ANAEROBIC Blood Culture results may not be optimal due to an inadequate volume of blood received in culture bottles   Culture   Final    NO GROWTH 5 DAYS Performed at [facility], [address], [place], [place] [address]    Report Status 05/19/2020 FINAL  Final    RADIOLOGY:  No results found.   CODE STATUS:     Code Status Orders  (From admission, onward)         Start     Ordered   05/14/20 2259  Do not attempt resuscitation (DNR)  Continuous       Question Answer Comment  In the event of cardiac or respiratory ARREST Do not call a "code blue"   In the event  of cardiac or respiratory ARREST Do not perform Intubation, CPR, defibrillation or ACLS   In the event of cardiac or respiratory ARREST Use medication by any route, position, wound care, and other measures to relive pain and suffering. May use oxygen, suction and manual treatment of airway obstruction as needed for comfort.   Comments confirmed with patient      05/14/20 2258        Code Status History    Date Active Date Inactive Code Status Order ID Comments User Context   05/01/2020 1538 05/03/2020 2006 Full Code [id number]  Cipriano Bunker, MD ED   09/27/2019 1548 10/02/2019 1948 Full Code [id number]  Lorretta Harp, MD ED   01/03/2019 1542 01/06/2019 1656 DNR [id number]  Milagros Loll, MD ED   08/01/2016 1645 08/03/2016 1611 DNR [id number]  Milagros Loll, MD ED   08/01/2016 1548 08/01/2016 1645 Full Code [id number]  Milagros Loll, MD ED   07/09/2015 1610 07/11/2015 1840 Full Code [id number]  Enid Baas, MD ED   05/13/2015 0050 05/14/2015 1720 Full Code [id number]  Oralia Manis, MD ED   Advance Care Planning Activity       TOTAL TIME TAKING CARE OF THIS PATIENT: *35* minutes.    Enedina Finner M.D  [facility]    CC: Primary care physician; Dorothey Baseman, MD

## 2020-06-16 ENCOUNTER — Other Ambulatory Visit: Payer: Self-pay | Admitting: Internal Medicine

## 2020-06-22 DEATH — deceased

## 2020-07-14 ENCOUNTER — Ambulatory Visit: Payer: Medicare PPO | Admitting: Family

## 2020-07-20 ENCOUNTER — Ambulatory Visit (INDEPENDENT_AMBULATORY_CARE_PROVIDER_SITE_OTHER): Payer: Medicare PPO | Admitting: Vascular Surgery

## 2020-07-20 ENCOUNTER — Encounter (INDEPENDENT_AMBULATORY_CARE_PROVIDER_SITE_OTHER): Payer: Medicare PPO
